# Patient Record
Sex: Female | Born: 1945
Health system: Southern US, Community
[De-identification: ages and names within clinical notes are randomized; demographics above are authoritative.]

## PROBLEM LIST (undated history)

## (undated) DIAGNOSIS — M51369 Other intervertebral disc degeneration, lumbar region without mention of lumbar back pain or lower extremity pain: Secondary | ICD-10-CM

## (undated) DIAGNOSIS — M810 Age-related osteoporosis without current pathological fracture: Secondary | ICD-10-CM

## (undated) DIAGNOSIS — M549 Dorsalgia, unspecified: Secondary | ICD-10-CM

## (undated) DIAGNOSIS — I700 Atherosclerosis of aorta: Secondary | ICD-10-CM

## (undated) DIAGNOSIS — J189 Pneumonia, unspecified organism: Secondary | ICD-10-CM

## (undated) DIAGNOSIS — M1990 Unspecified osteoarthritis, unspecified site: Secondary | ICD-10-CM

## (undated) DIAGNOSIS — F419 Anxiety disorder, unspecified: Secondary | ICD-10-CM

## (undated) DIAGNOSIS — N390 Urinary tract infection, site not specified: Secondary | ICD-10-CM

## (undated) DIAGNOSIS — F329 Major depressive disorder, single episode, unspecified: Secondary | ICD-10-CM

## (undated) DIAGNOSIS — R200 Anesthesia of skin: Secondary | ICD-10-CM

## (undated) DIAGNOSIS — Z8619 Personal history of other infectious and parasitic diseases: Secondary | ICD-10-CM

## (undated) DIAGNOSIS — E785 Hyperlipidemia, unspecified: Secondary | ICD-10-CM

## (undated) DIAGNOSIS — K3184 Gastroparesis: Secondary | ICD-10-CM

## (undated) DIAGNOSIS — M5136 Other intervertebral disc degeneration, lumbar region: Secondary | ICD-10-CM

## (undated) DIAGNOSIS — F32A Depression, unspecified: Secondary | ICD-10-CM

## (undated) DIAGNOSIS — Z87442 Personal history of urinary calculi: Secondary | ICD-10-CM

## (undated) DIAGNOSIS — I10 Essential (primary) hypertension: Secondary | ICD-10-CM

## (undated) DIAGNOSIS — C801 Malignant (primary) neoplasm, unspecified: Secondary | ICD-10-CM

## (undated) HISTORY — PX: CARPAL TUNNEL RELEASE: SHX101

## (undated) HISTORY — DX: Age-related osteoporosis without current pathological fracture: M81.0

## (undated) HISTORY — PX: TONSILLECTOMY: SUR1361

## (undated) HISTORY — PX: TUBAL LIGATION: SHX77

## (undated) HISTORY — DX: Atherosclerosis of aorta: I70.0

## (undated) HISTORY — PX: GANGLION CYST EXCISION: SHX1691

## (undated) HISTORY — PX: RHINOPLASTY: SUR1284

## (undated) HISTORY — PX: EYE SURGERY: SHX253

## (undated) HISTORY — PX: JOINT REPLACEMENT: SHX530

## (undated) HISTORY — DX: Anesthesia of skin: R20.0

## (undated) HISTORY — PX: LITHOTRIPSY: SUR834

## (undated) HISTORY — DX: Hyperlipidemia, unspecified: E78.5

---

## 2003-05-31 ENCOUNTER — Encounter (INDEPENDENT_AMBULATORY_CARE_PROVIDER_SITE_OTHER): Payer: Self-pay | Admitting: *Deleted

## 2003-05-31 LAB — CONVERTED CEMR LAB

## 2003-06-14 ENCOUNTER — Encounter: Admission: RE | Admit: 2003-06-14 | Discharge: 2003-06-14 | Payer: Self-pay | Admitting: Family Medicine

## 2005-07-18 ENCOUNTER — Ambulatory Visit (HOSPITAL_COMMUNITY): Admission: RE | Admit: 2005-07-18 | Discharge: 2005-07-18 | Payer: Self-pay | Admitting: Gastroenterology

## 2005-07-18 ENCOUNTER — Ambulatory Visit: Payer: Self-pay | Admitting: Gastroenterology

## 2006-06-26 DIAGNOSIS — I10 Essential (primary) hypertension: Secondary | ICD-10-CM

## 2006-06-26 DIAGNOSIS — N200 Calculus of kidney: Secondary | ICD-10-CM

## 2006-06-26 HISTORY — DX: Essential (primary) hypertension: I10

## 2006-06-26 HISTORY — DX: Calculus of kidney: N20.0

## 2006-06-27 ENCOUNTER — Encounter (INDEPENDENT_AMBULATORY_CARE_PROVIDER_SITE_OTHER): Payer: Self-pay | Admitting: *Deleted

## 2010-05-23 ENCOUNTER — Encounter
Admission: RE | Admit: 2010-05-23 | Discharge: 2010-05-23 | Payer: Self-pay | Source: Home / Self Care | Attending: Family Medicine | Admitting: Family Medicine

## 2010-09-26 ENCOUNTER — Other Ambulatory Visit: Payer: Self-pay | Admitting: Family Medicine

## 2010-09-26 DIAGNOSIS — Z1231 Encounter for screening mammogram for malignant neoplasm of breast: Secondary | ICD-10-CM

## 2010-10-17 ENCOUNTER — Ambulatory Visit
Admission: RE | Admit: 2010-10-17 | Discharge: 2010-10-17 | Disposition: A | Discharge status: Home / Self Care | Payer: BC Managed Care – PPO | Source: Ambulatory Visit | Attending: Family Medicine | Admitting: Family Medicine

## 2010-10-17 DIAGNOSIS — Z1231 Encounter for screening mammogram for malignant neoplasm of breast: Secondary | ICD-10-CM

## 2011-04-30 HISTORY — PX: BACK SURGERY: SHX140

## 2011-09-28 HISTORY — PX: LUMBAR FUSION: SHX111

## 2011-10-18 ENCOUNTER — Encounter (HOSPITAL_COMMUNITY): Payer: Self-pay | Admitting: Pharmacy Technician

## 2011-10-21 NOTE — H&P (Signed)
Kimberly Larsen 10/21/2011 8:13 AM Location: [facility] Patient #: [id number] DOB: 1945/11/18 Single / Language: Kimberly Larsen / Race: White Female   History of Present Illness(Kimberly Larsen; 10/21/2011 8:15 AM) The patient is a 66 year old female who comes in today for a preoperative History and Physical. The patient is scheduled for a lumbar TLIF L3-4 and decompression L4-5 to be performed by Dr. Debria Garret D. Kimberly Baton, MD at [facility] on [phone] @1000am  . Please see the hospital record for complete dictated history and physical.    Allergies(Kimberly Larsen; 10/21/2011 8:15 AM) No Known Drug Allergies. 02/25/2011   Social History(Kimberly Larsen; 10/21/2011 8:15 AM) Alcohol use. never consumed alcohol Children. 1 Current work status. working full time Drug/[facility]cohol Rehab (Currently). no Exercise. Exercises weekly; does individual sport Illicit drug use. no Living situation. live alone Marital status. 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 greater than 5 Pain Contract. no Previously in rehab. no Tobacco / smoke exposure. no Tobacco use. Never smoker. never smoker   Medication History(Kimberly Larsen; 10/21/2011 8:23 AM) Norco (5-325MG  Tablet, 1 (one) Oral PO TID PRN PAIN, Taken starting 10/18/2011) Active. Folic Acid (qd) Specific dose unknown - Active. Pravastatin Sodium (40MG  Tablet, qd Oral) Active. Quinapril-Hydrochlorothiazide (20-12.5MG  Tablet, 1 po bid Oral) Active. Lodine (400MG  Tablet, Oral) Active. (bid; stopped [id number]) Calcium Citrate (500MG  Capsule, 1 Oral tid) Active. Multiple Vitamin (1 Oral) Active. Cymbalta (60MG  Capsule DR Part, 1qd am Oral) Active. Benadryl (qhs Oral) Specific dose unknown - Active. Melatonin (qd) Specific dose unknown - Active. Super B Complex (bid Oral) Active. Neurontin (600MG  Tablet, 1 po tid (2 tabs at bedtime) Oral)  Active. Aspirin EC (81MG  Tablet DR, qd Oral) Active.   Past Surgical History(Kimberly Larsen; 10/21/2011 8:23 AM) Carpal Tunnel Repair. right Other Surgery. Tubal Ligation, Lithotripsy x 2 Straighten Nasal Septum Tonsillectomy Tubal Ligation   Review of Systems(Kimberly Hiraldo J Kaysen Deal, PA-C; 10/21/2011 8:43 AM) General:Present- Weight Gain. Not Present- Chills, Fever, Night Sweats, Appetite Loss, Fatigue, Feeling sick and Weight Loss. Skin:Not Present- Itching, Rash, Skin Color Changes, Ulcer, Psoriasis and Change in Hair or Nails. HEENT:Not Present- Sensitivity to light, Nose Bleed, Visual Loss, Decreased Hearing and Ringing in the Ears. Neck:Not Present- Swollen Glands and Neck Mass. Respiratory:Not Present- Shortness of breath, Snoring, Chronic Cough and Bloody sputum. Cardiovascular:Not Present- Shortness of Breath, Chest Pain, Swelling of Extremities, Leg Cramps and Palpitations. Gastrointestinal:Not Present- Bloody Stool, Heartburn, Abdominal Pain, Vomiting, Nausea and Incontinence of Stool. Female Genitourinary:Not Present- Blood in Urine, Irregular/missing periods, Frequency, Incontinence and Nocturia. Musculoskeletal:Present- Muscle Weakness, Muscle Pain, Joint Stiffness, Joint Swelling, Joint Pain and Back Pain. Neurological:Present- Tingling. Not Present- Numbness, Burning, Tremor, Headaches and Dizziness. Psychiatric:Present- Anxiety. Not Present- Depression and Memory Loss. Endocrine:Present- Excessive hunger. Not Present- Cold Intolerance, Heat Intolerance and Excessive Thirst. Hematology:Not Present- Abnormal Bleeding, Abnormal bruising, Anemia and Blood Clots.   Vitals(Kimberly Larsen; 10/21/2011 8:26 AM) 10/21/2011 8:24 AM Weight: 201 lb Height: 62 in Body Surface Area: 2 m Body Mass Index: 36.76 kg/m Pulse: 84 (Regular) BP: 151/86 (Sitting, Left Arm, Standard)    Physical Exam([name], PA-C; 10/21/2011 9:09 AM) The physical exam findings are  as follows:   General General Appearance- pleasant. Not in acute distress. Orientation- Oriented X3. Build & Nutrition- Well nourished and Well developed. Posture- Normal posture. Gait- Normal. Mental Status- Alert.   Integumentary Lumbar Spine- Skin examination of the lumbar spine is without deformity, skin  lesions, lacerations or abrasions.   Head and Neck Neck Global Assessment- supple. no lymphadenopathy and no nucchal rigidty.   Eye Pupil- Bilateral- Normal, Direct reaction to light normal, Equal and Regular. Motion- Bilateral- EOMI.   Chest and Lung Exam Auscultation: Breath sounds:- Clear.   Cardiovascular Auscultation:Rhythm- Regular rate and rhythm. Heart Sounds- Normal heart sounds.   Abdomen Palpation/Percussion:Palpation and Percussion of the abdomen reveal - Non Tender, No Rebound tenderness and Soft.   Peripheral Vascular Lower Extremity:Inspection- Bilateral- Inspection Normal. Palpation:Posterior tibial pulse- Bilateral- 2+. Dorsalis pedis pulse- Bilateral- 2+.   Neurologic Sensation:Lower Extremity- Bilateral- sensation is intact in the lower extremity. Reflexes:Patellar Reflex- Bilateral- 1+. Achilles Reflex- Bilateral- 1+. Babinski- Bilateral- Babinski not present. Clonus- Bilateral- clonus not present. Hoffman's Sign- Bilateral- Hoffman's sign not present. Testing:Seated Straight Leg Raise- Bilateral- Seated straight leg raise negative.   Musculoskeletal Spine/Ribs/Pelvis Lumbosacral Spine:Inspection and Palpation- Tenderness- generalized. bony and soft tissue palpation of the lumbar spine and SI joint does not recreate their typical pain. Strength and Tone: Strength:Hip Flexion- Bilateral- 5/5. Knee Extension- Bilateral- 5/5. Knee Flexion- Bilateral- 5/5. Ankle Dorsiflexion- Bilateral- 5/5. Ankle Plantarflexion- Bilateral- 5/5. Heel walk- Bilateral- able to heel walk without  difficulty. Toe Walk- Bilateral- able to walk on toes without difficulty. Heel-Toe Walk- Bilateral- able to heel-toe walk without difficulty. ROM- Flexion- full range of motion. Extension- full range of motion. Pain:- neither flexion or extension is more painful than the other. Waddell's Signs- no Waddell's signs present. Lower Extremity Range of Motion:- No true hip, knee or ankle pain with range of motion. Gait and Station:Assistive Devices- no assistive devices.   Assessment & Plan(Tommie Bohlken J Germaine Shenker, PA-C; 10/21/2011 9:02 AM) Lumbar/Lumbosacral Disc Degeneration (722.52) Current Plans l Started Xanax 0.25MG , 1 Tablet 1 qhs, #7, 10/21/2011, No Refill.  Spondylolisthesis, Acquired (738.4)  Note: unfortunately conservative measures consisting of observation, activity modification, oral pain medications, physical therapy and injection therapy have failed to alleviate her symptoms and given the ongoing nature eof her pain and the significant decrease in her quality of life, she wises to proceed with surgery. Risks/benefits/alternatives to the procedure/expectations following the procedure have been reviewed with the patient by Dr. Shon Baton. She understands  MRI of the lumbar spine dated 05/14/11 demonstrates a grade 1 anterior listhesis at L3-L4 with severe spinal stenosis right foraminal stenosis. She has left lateral recess stenosis at 4-5 and to a lesser degree 5-1. Please see the actual report in the patients chart for the specifics.  She has been medically cleared for the procedure by Dr. [name]. Please see the scanned clearance form in the patient office chart. She has not yet completed her pre-op hospital requirements and is scheduled to do so tomorrow. She has been fitted for her lumbar corsett brace this morning and she knows to bring this with her the morning of surgery.   All of her questions have been encouraged, addressed and answered. Plan, at this time is  to proceed with surgery as scheduled.   Signed electronically by Gwinda Maine, PA-C (10/21/2011 9:11 AM)  Glory Buff 10/01/2011 9:04 AM Location: [facility] Patient #: [id number] DOB: Apr 17, 1946 Single / Language: Kimberly Larsen / Race: White Female   History of Present Illness(Kimberly Larsen; 10/01/2011 9:16 AM) The patient is a 66 year old female who presents with back pain. The patient is here today in referral from [facility]. patient of practice . The patient reports low back symptoms including pain which began 17 year(s) ago following a specific injury (2012). The  injury occurred due to lifting (she did remember lifting a pack of paper with low back pain ) and bending. and Symptoms include pain (about the lower lumbar region radiating into bilat. lower ext. (anteriorly) on the right to the level of the knee and left posterior to the level of the knee and anteriorly to mid quad.), while symptoms do not include numbness or weakness. Current treatment includes non-opioid analgesics and activity modification. Prior to being seen today the patient was previously evaluated by an out of town physician ([facility] Past evaluation has included x-ray of the lumbar spine, MRI of the lumbar spine (recently performed at [facility]. 04/2011), orthopedic evaluation and neurosurgical evaluation. Past treatment has included epidural injections (times three with minimal relief from the third ).    Subjective Transcription(DAHARI Sheela Stack, MD; 10/03/2011 9:18 AM)  Reason for consultation: Persistent back, buttock and hamstring pain.    Khianna is a very pleasant, active young woman who is still working who over the last 6-7 months has had progressive debilitating back pain. She had a MRI done May 14, 2011 which demonstrated a 3-4 slip with severe bilateral facet arthritis, thickening of the ligamentum flavum and severe spinal stenosis and a right sided disc protrusion. She also had a small She also  had a small broad based disc protrusion at L4-L5 onto the left causing severe left lateral recess stenosis and a 5-1 central disc protrusion also slightly asymmetric to the left causing some compression of the S1 nerve root. The patient's principle complaint has been ongoing severe debilitating back and buttock pain. She has difficulty arising from a seated position. Difficulty walking. She states that she had a series of three epidural injections which only provided temporary relief. She has attempted to do exercises on her own but her overall quality of life is deteriorating. Her overall mental wellbeing is deteriorating because of this pain. As a result she referred herself to me for a surgical opinion concerning her pain.    RADIOGRAPHS:  I reviewed the MRI with the patient and her daughter. All of their questions were addressed. She does have a grade 1 anterior listhesis at L3-L4 with severe spinal stenosis right foraminal stenosis. She has left lateral recess stenosis at 4-5 and to a lesser degree 5-1.    Allergies(Kimberly Larsen; 10/01/2011 9:16 AM) No Known Drug Allergies. 02/25/2011   Family History(Kimberly Larsen; 10/01/2011 9:16 AM) Cerebrovascular Accident. mother Chronic Obstructive Lung Disease. mother and father Congestive Heart Failure. father Drug / Alcohol Addiction. father Heart Disease. father Heart disease in female family member before age 72 Heart disease in female family member before age 89 Hypertension. father   Social History(Kimberly Larsen; 10/01/2011 9:16 AM) Alcohol use. never consumed alcohol Children. 1 Current work status. working full time Drug/Alcohol Rehab (Currently). no Exercise. Exercises weekly; does individual sport Illicit drug use. no Living situation. live alone Marital status. 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 greater than  5 Pain Contract. no Previously in rehab. no Tobacco / smoke exposure. no Tobacco use. Never smoker. never smoker   Medication History(Kimberly Larsen; 10/01/2011 9:18 AM) Pravastatin Sodium (40MG  Tablet, Oral) Active. Quinapril-Hydrochlorothiazide (20-12.5MG  Tablet, Oral) Active. Lodine (400MG  Tablet, Oral) Active. (bid) Neurontin (300MG  Capsule, Oral) Active. Ultram (50MG  Tablet, Oral) Active. Calcium Citrate (200MG  Tablet, 1 Oral) Active. Multiple Vitamin (1 Oral) Active. Fish Oil Concentrate (435MG  Capsule, 1 Oral) Active. Aspirin (81MG  Tablet, 1 Oral) Active.  Zinc 15 ( Oral) Specific dose unknown - Active. Cymbalta ( Oral) Specific dose unknown - Active. Vitamin C ( Oral) Specific dose unknown - Active. Benadryl ( Oral) Specific dose unknown - Active. Melatonin Specific dose unknown - Active. Super B Complex ( Oral) Active. Magnesium Oxide (400MG  Capsule, Oral) Active. Vitamin D3 ( Oral) Specific dose unknown - Active. Potassium Acetate Specific dose unknown - Active. Ginkgo Biloba Specific dose unknown - Active.   Pregnancy / Birth History(Kimberly Larsen; 10/01/2011 9:16 AM) Pregnant. no   Past Surgical History(Kimberly Larsen; 10/01/2011 9:16 AM) Carpal Tunnel Repair. right Other Surgery. Tubal Ligation, Lithotripsy x 2 Straighten Nasal Septum Tonsillectomy Tubal Ligation   Other Problems(DAHARI D BROOKS, MD; 10/01/2011 9:52 AM) Anxiety Disorder Chronic Pain Depression Gastroesophageal Reflux Disease High blood pressure Hypercholesterolemia Kidney Stone Osteoarthritis   Review of Systems(Kimberly Larsen; 10/01/2011 9:16 AM) General:Present- Weight Gain. Not Present- Chills, Fever, Night Sweats, Appetite Loss, Fatigue, Feeling sick and Weight Loss. Skin:Not Present- Itching, Rash, Skin Color Changes, Ulcer, Psoriasis and Change in Hair or Nails. HEENT:Not Present- Sensitivity to light, Nose Bleed, Visual Loss, Decreased Hearing and Ringing in the  Ears. Neck:Not Present- Swollen Glands and Neck Mass. Respiratory:Not Present- Shortness of breath, Snoring, Chronic Cough and Bloody sputum. Cardiovascular:Not Present- Shortness of Breath, Chest Pain, Swelling of Extremities, Leg Cramps and Palpitations. Gastrointestinal:Not Present- Bloody Stool, Heartburn, Abdominal Pain, Vomiting, Nausea and Incontinence of Stool. Female Genitourinary:Not Present- Blood in Urine, Irregular/missing periods, Frequency, Incontinence and Nocturia. Musculoskeletal:Present- Muscle Weakness, Muscle Pain, Joint Stiffness, Joint Swelling, Joint Pain and Back Pain. Neurological:Present- Tingling. Not Present- Numbness, Burning, Tremor, Headaches and Dizziness. Psychiatric:Present- Anxiety. Not Present- Depression and Memory Loss. Endocrine:Present- Excessive hunger. Not Present- Cold Intolerance, Heat Intolerance and Excessive Thirst. Hematology:Not Present- Abnormal Bleeding, Abnormal bruising, Anemia and Blood Clots.   Vitals(Kimberly Larsen; 10/01/2011 9:20 AM) 10/01/2011 9:18 AM Weight: 201 lb Height: 61 in Body Surface Area: 1.98 m Body Mass Index: 37.98 kg/m BP: 137/78 (Sitting, Left Arm, Standard)    Objective Transcription(DAHARI D BROOKS, MD; 10/03/2011 9:18 AM)  She is a pleasant young woman who appears her stated age in no acute distress. She is alert and oriented x3. No shortness of breath or chest pain. The abdomen is soft and nontender. No surgical scars, abrasions or contusions on the lumbar spine. She has pain with forward flexion and extension and lateral rotation. She has no focal motor deficits in the lower extremity. She has sensation intact to light touch in the lower extremities. Compartments are soft and nontender. Intact peripheral pulses bilaterally. Negative Babinski test. 1+ symmetrical deep tendon reflexes. She has a history of right knee arthritis and she has some pain anteriorly with range of motion but  otherwise no hip or ankle pain or left knee pain with palpation and range of motion.    Assessment & Plan(DAHARI D BROOKS, MD; 10/01/2011 9:52 AM) Lumbar/Lumbosacral Disc Degeneration (722.52)  Spondylolisthesis, Acquired (738.4)   Assessments Transcription(DAHARI D BROOKS, MD; 10/03/2011 9:18 AM)  At this point in time, the patient has severe spinal stenosis at 3-4 with instability as well as moderate left lateral recess stenosis at 4-5 and 5-1. After a long discussion with the patient and her daughter we have made the final decision. She indicates that she has had a DEXA scan in the recent past and did not have any significant osteoporosis or osteopenia. Given the fact that she has good quality of bone and she has instability at 3-4 I would favor doing  a TLIF at that level. The patient is going to require a decompression to alleviate the stenosis and then because of the decompression there is now an increased risk of instability because of this spondylolisthesis that exists there. I think this can be best addressed by doing a decompression and then the interbody fixation to prevent instability. Because there is also pathology at 4-5 and to a lesser degree at 5-1 coming in from the posterior approach also allowing to do a decompression (laminotomy/laminectomy) at 4-5 as well as at 5-1 on the left side as this is the area of greatest stenosis. I do think that this approach provides me adequate exposure to do all that I need to do. I have reviewed the risks with the patient which includes infection, bleeding, nerve damage, death, stroke, paralysis, failure to heal, need for further surgery, ongoing or worse pain, loss of bowel and bladder control, hardware failure, leak of spinal fluid, need for further surgery, non-union meaning it does not fuse or the levels above or below can breakdown and require more surgery. All questions encouraged and addressed. She will review the material and contact  me and let me know how she would like to proceed.    Miscellaneous Transcription(DAHARI Sheela Stack, MD; 10/03/2011 9:18 AM)  Alvy Beal, MD/ljs    T: 10/03/11  D: 10/01/11    Signed electronically by Alvy Beal, MD (10/01/2011 4:29 PM)

## 2011-10-22 ENCOUNTER — Encounter (HOSPITAL_COMMUNITY): Payer: Self-pay

## 2011-10-22 ENCOUNTER — Encounter (HOSPITAL_COMMUNITY)
Admission: RE | Admit: 2011-10-22 | Discharge: 2011-10-22 | Disposition: A | Discharge status: Home / Self Care | Payer: BC Managed Care – PPO | Source: Ambulatory Visit | Attending: Orthopedic Surgery | Admitting: Orthopedic Surgery

## 2011-10-22 ENCOUNTER — Encounter (HOSPITAL_COMMUNITY)
Admission: RE | Admit: 2011-10-22 | Discharge: 2011-10-22 | Disposition: A | Discharge status: Home / Self Care | Payer: BC Managed Care – PPO | Source: Ambulatory Visit | Attending: Physician Assistant | Admitting: Physician Assistant

## 2011-10-22 HISTORY — DX: Unspecified osteoarthritis, unspecified site: M19.90

## 2011-10-22 HISTORY — DX: Essential (primary) hypertension: I10

## 2011-10-22 HISTORY — DX: Hyperlipidemia, unspecified: E78.5

## 2011-10-22 LAB — COMPREHENSIVE METABOLIC PANEL
ALT: 24 U/L (ref 0–35)
AST: 23 U/L (ref 0–37)
Albumin: 3.7 g/dL (ref 3.5–5.2)
Alkaline Phosphatase: 110 U/L (ref 39–117)
BUN: 21 mg/dL (ref 6–23)
CO2: 30 mEq/L (ref 19–32)
Calcium: 10.1 mg/dL (ref 8.4–10.5)
Chloride: 100 mEq/L (ref 96–112)
Creatinine, Ser: 0.7 mg/dL (ref 0.50–1.10)
GFR calc Af Amer: >90 mL/min (ref >90)
GFR calc non Af Amer: >90 mL/min (ref >90)
Glucose, Bld: 91 mg/dL (ref 70–99)
Potassium: 3.2 mEq/L — ABNORMAL LOW (ref 3.5–5.1)
Sodium: 140 mEq/L (ref 135–145)
Total Bilirubin: 0.5 mg/dL (ref 0.3–1.2)
Total Protein: 7 g/dL (ref 6.0–8.3)

## 2011-10-22 LAB — DIFFERENTIAL
Basophils Absolute: 0.1 10*3/uL (ref 0.0–0.1)
Basophils Relative: 1 % (ref 0–1)
Eosinophils Absolute: 0.1 10*3/uL (ref 0.0–0.7)
Eosinophils Relative: 1 % (ref 0–5)
Lymphocytes Relative: 16 % (ref 12–46)
Lymphs Abs: 1.9 10*3/uL (ref 0.7–4.0)
Monocytes Absolute: 1.2 10*3/uL — ABNORMAL HIGH (ref 0.1–1.0)
Monocytes Relative: 10 % (ref 3–12)
Neutro Abs: 8.8 10*3/uL — ABNORMAL HIGH (ref 1.7–7.7)
Neutrophils Relative %: 73 % (ref 43–77)

## 2011-10-22 LAB — CBC
HCT: 42.5 % (ref 36.0–46.0)
Hemoglobin: 14 g/dL (ref 12.0–15.0)
MCH: 31 pg (ref 26.0–34.0)
MCHC: 32.9 g/dL (ref 30.0–36.0)
MCV: 94.2 fL (ref 78.0–100.0)
Platelets: 319 10*3/uL (ref 150–400)
RBC: 4.51 MIL/uL (ref 3.87–5.11)
RDW: 12.6 % (ref 11.5–15.5)
WBC: 12 10*3/uL — ABNORMAL HIGH (ref 4.0–10.5)

## 2011-10-22 LAB — TYPE AND SCREEN
ABO/RH(D): O POS
Antibody Screen: NEGATIVE

## 2011-10-22 LAB — PROTIME-INR
INR: 0.91 (ref 0.00–1.49)
Prothrombin Time: 12.5 seconds (ref 11.6–15.2)

## 2011-10-22 LAB — SURGICAL PCR SCREEN
MRSA, PCR: NEGATIVE
Staphylococcus aureus: NEGATIVE

## 2011-10-22 LAB — ABO/RH: ABO/RH(D): O POS

## 2011-10-22 NOTE — Progress Notes (Signed)
Requested CXR and EKG from Dr. Len Childs office, [place].

## 2011-10-22 NOTE — Pre-Procedure Instructions (Signed)
[name]  10/22/2011   Your procedure is scheduled on:  Thursday June 27  Report to [facility] at 10:00 AM.  Call this number if you have problems the morning of surgery: [phone]   Remember:   Do not eat food:After Midnight.  May have clear liquids:until Midnight .  Clear liquids include soda, tea, black coffee, apple or grape juice, broth.  Take these medicines the morning of surgery with A SIP OF WATER: Cymbalta, Neurontin, Tramadol if needed   Do not wear jewelry, make-up or nail polish.  Do not wear lotions, powders, or perfumes. You may wear deodorant.  Do not shave 48 hours prior to surgery. Men may shave face and neck.  Do not bring valuables to the hospital.  Contacts, dentures or bridgework may not be worn into surgery.  Leave suitcase in the car. After surgery it may be brought to your room.  For patients admitted to the hospital, checkout time is 11:00 AM the day of discharge.   Patients discharged the day of surgery will not be allowed to drive home.  Name and phone number of your driver: NA  Special Instructions: Incentive Spirometry - Practice and bring it with you on the day of surgery. and CHG Shower Use Special Wash: 1/2 bottle night before surgery and 1/2 bottle morning of surgery.   Please read over the following fact sheets that you were given: Pain Booklet, Coughing and Deep Breathing, Blood Transfusion Information and Surgical Site Infection Prevention

## 2011-10-23 MED ORDER — CEFAZOLIN SODIUM-DEXTROSE 2-3 GM-% IV SOLR
2.0000 g | INTRAVENOUS | Status: AC
  Administered 2011-10-24: 2 g via INTRAVENOUS
  Filled 2011-10-23: qty 50

## 2011-10-23 MED ORDER — LACTATED RINGERS IV SOLN
INTRAVENOUS | Status: DC
  Administered 2011-10-24: 12:00:00 via INTRAVENOUS

## 2011-10-24 ENCOUNTER — Ambulatory Visit (HOSPITAL_COMMUNITY): Payer: BC Managed Care – PPO

## 2011-10-24 ENCOUNTER — Inpatient Hospital Stay (HOSPITAL_COMMUNITY)
Admission: RE | Admit: 2011-10-24 | Discharge: 2011-10-28 | DRG: 756 | Disposition: A | Discharge status: Home Health | Payer: BC Managed Care – PPO | Source: Ambulatory Visit | Attending: Orthopedic Surgery | Admitting: Orthopedic Surgery

## 2011-10-24 ENCOUNTER — Encounter (HOSPITAL_COMMUNITY): Payer: Self-pay | Admitting: *Deleted

## 2011-10-24 ENCOUNTER — Encounter (HOSPITAL_COMMUNITY)
Admission: RE | Disposition: A | Discharge status: Home Health | Payer: Self-pay | Source: Ambulatory Visit | Attending: Orthopedic Surgery

## 2011-10-24 ENCOUNTER — Encounter (HOSPITAL_COMMUNITY): Payer: Self-pay | Admitting: Anesthesiology

## 2011-10-24 ENCOUNTER — Inpatient Hospital Stay (HOSPITAL_COMMUNITY): Payer: BC Managed Care – PPO

## 2011-10-24 ENCOUNTER — Ambulatory Visit (HOSPITAL_COMMUNITY): Payer: BC Managed Care – PPO | Admitting: Anesthesiology

## 2011-10-24 DIAGNOSIS — Z7982 Long term (current) use of aspirin: Secondary | ICD-10-CM

## 2011-10-24 DIAGNOSIS — M4310 Spondylolisthesis, site unspecified: Secondary | ICD-10-CM | POA: Diagnosis present

## 2011-10-24 DIAGNOSIS — M5137 Other intervertebral disc degeneration, lumbosacral region: Principal | ICD-10-CM | POA: Diagnosis present

## 2011-10-24 DIAGNOSIS — Z9089 Acquired absence of other organs: Secondary | ICD-10-CM

## 2011-10-24 DIAGNOSIS — M51379 Other intervertebral disc degeneration, lumbosacral region without mention of lumbar back pain or lower extremity pain: Principal | ICD-10-CM | POA: Diagnosis present

## 2011-10-24 DIAGNOSIS — M5136 Other intervertebral disc degeneration, lumbar region: Secondary | ICD-10-CM

## 2011-10-24 DIAGNOSIS — Z01812 Encounter for preprocedural laboratory examination: Secondary | ICD-10-CM

## 2011-10-24 SURGERY — POSTERIOR LUMBAR FUSION 1 LEVEL
Anesthesia: General | Site: Back | Wound class: Clean

## 2011-10-24 MED ORDER — METHOCARBAMOL 100 MG/ML IJ SOLN
500.0000 mg | INTRAVENOUS | Status: AC
  Administered 2011-10-24: 500 mg via INTRAVENOUS
  Filled 2011-10-24: qty 5

## 2011-10-24 MED ORDER — DEXAMETHASONE 4 MG PO TABS
4.0000 mg | ORAL_TABLET | Freq: Four times a day (QID) | ORAL | Status: DC
Start: 2011-10-25 — End: 2011-10-28
  Administered 2011-10-25 – 2011-10-28 (×11): 4 mg via ORAL
  Filled 2011-10-24 (×18): qty 1

## 2011-10-24 MED ORDER — ONDANSETRON HCL 4 MG/2ML IJ SOLN
INTRAMUSCULAR | Status: DC | PRN
  Administered 2011-10-24: 4 mg via INTRAVENOUS

## 2011-10-24 MED ORDER — DIPHENHYDRAMINE HCL 12.5 MG/5ML PO ELIX: 12.5000 mg | ORAL_SOLUTION | Freq: Four times a day (QID) | ORAL | Status: DC | PRN

## 2011-10-24 MED ORDER — FLEET ENEMA 7-19 GM/118ML RE ENEM: 1.0000 | ENEMA | Freq: Once | RECTAL | Status: AC | PRN

## 2011-10-24 MED ORDER — HYDROMORPHONE HCL PF 1 MG/ML IJ SOLN
INTRAMUSCULAR | Status: AC
  Administered 2011-10-24: 0.5 mg via INTRAVENOUS
  Filled 2011-10-24: qty 1

## 2011-10-24 MED ORDER — SUCCINYLCHOLINE CHLORIDE 20 MG/ML IJ SOLN
INTRAMUSCULAR | Status: DC | PRN
  Administered 2011-10-24: 100 mg via INTRAVENOUS

## 2011-10-24 MED ORDER — VANCOMYCIN HCL POWD: 500.0000 mg | Status: DC

## 2011-10-24 MED ORDER — DEXAMETHASONE SODIUM PHOSPHATE 4 MG/ML IJ SOLN
INTRAMUSCULAR | Status: DC | PRN
  Administered 2011-10-24: 12 mg via INTRAVENOUS

## 2011-10-24 MED ORDER — METHOCARBAMOL 500 MG PO TABS
500.0000 mg | ORAL_TABLET | Freq: Four times a day (QID) | ORAL | Status: DC | PRN
  Administered 2011-10-24 – 2011-10-27 (×7): 500 mg via ORAL
  Filled 2011-10-24 (×7): qty 1

## 2011-10-24 MED ORDER — VANCOMYCIN HCL 500 MG IV SOLR
INTRAVENOUS | Status: DC | PRN
  Administered 2011-10-24: 500 mg

## 2011-10-24 MED ORDER — HYDROCHLOROTHIAZIDE 12.5 MG PO CAPS
12.5000 mg | ORAL_CAPSULE | Freq: Every day | ORAL | Status: DC
  Administered 2011-10-24 – 2011-10-28 (×5): 12.5 mg via ORAL
  Filled 2011-10-24 (×5): qty 1

## 2011-10-24 MED ORDER — ALPRAZOLAM 0.25 MG PO TABS
0.2500 mg | ORAL_TABLET | Freq: Every evening | ORAL | Status: DC | PRN
  Administered 2011-10-24 – 2011-10-28 (×4): 0.25 mg via ORAL
  Filled 2011-10-24 (×4): qty 1

## 2011-10-24 MED ORDER — SODIUM CHLORIDE 0.9 % IJ SOLN: 9.0000 mL | INTRAMUSCULAR | Status: DC | PRN

## 2011-10-24 MED ORDER — ZOLPIDEM TARTRATE 10 MG PO TABS
10.0000 mg | ORAL_TABLET | Freq: Every evening | ORAL | Status: DC | PRN
  Administered 2011-10-24 – 2011-10-27 (×4): 10 mg via ORAL
  Filled 2011-10-24 (×4): qty 1

## 2011-10-24 MED ORDER — DULOXETINE HCL 60 MG PO CPEP
60.0000 mg | ORAL_CAPSULE | Freq: Every day | ORAL | Status: DC
  Administered 2011-10-25 – 2011-10-28 (×4): 60 mg via ORAL
  Filled 2011-10-24 (×5): qty 1

## 2011-10-24 MED ORDER — LACTATED RINGERS IV SOLN: INTRAVENOUS | Status: DC

## 2011-10-24 MED ORDER — PHENOL 1.4 % MT LIQD: 1.0000 | OROMUCOSAL | Status: DC | PRN

## 2011-10-24 MED ORDER — SIMVASTATIN 5 MG PO TABS
5.0000 mg | ORAL_TABLET | Freq: Every day | ORAL | Status: DC
  Administered 2011-10-24 – 2011-10-27 (×4): 5 mg via ORAL
  Filled 2011-10-24 (×5): qty 1

## 2011-10-24 MED ORDER — PHENYLEPHRINE HCL 10 MG/ML IJ SOLN
INTRAMUSCULAR | Status: DC | PRN
  Administered 2011-10-24: 40 ug via INTRAVENOUS
  Administered 2011-10-24: 80 ug via INTRAVENOUS
  Administered 2011-10-24 (×2): 40 ug via INTRAVENOUS

## 2011-10-24 MED ORDER — THROMBIN 20000 UNITS EX KIT
PACK | OROMUCOSAL | Status: DC | PRN
  Administered 2011-10-24: 15:00:00 via TOPICAL

## 2011-10-24 MED ORDER — BUPIVACAINE-EPINEPHRINE 0.25% -1:200000 IJ SOLN
INTRAMUSCULAR | Status: DC | PRN
  Administered 2011-10-24: 10 mL

## 2011-10-24 MED ORDER — QUINAPRIL-HYDROCHLOROTHIAZIDE 20-12.5 MG PO TABS: 1.0000 | ORAL_TABLET | Freq: Every day | ORAL | Status: DC

## 2011-10-24 MED ORDER — LISINOPRIL 20 MG PO TABS
20.0000 mg | ORAL_TABLET | Freq: Every day | ORAL | Status: DC
  Administered 2011-10-24 – 2011-10-28 (×5): 20 mg via ORAL
  Filled 2011-10-24 (×5): qty 1

## 2011-10-24 MED ORDER — ACETAMINOPHEN 10 MG/ML IV SOLN
INTRAVENOUS | Status: AC
  Filled 2011-10-24: qty 100

## 2011-10-24 MED ORDER — MORPHINE SULFATE (PF) 1 MG/ML IV SOLN
INTRAVENOUS | Status: DC
  Administered 2011-10-24: 20:00:00 via INTRAVENOUS
  Administered 2011-10-25: 3 mg via INTRAVENOUS
  Administered 2011-10-25: 1 mg via INTRAVENOUS

## 2011-10-24 MED ORDER — PROPOFOL 10 MG/ML IV EMUL
INTRAVENOUS | Status: DC | PRN
  Administered 2011-10-24: 25 ug/kg/min via INTRAVENOUS

## 2011-10-24 MED ORDER — DEXAMETHASONE SODIUM PHOSPHATE 4 MG/ML IJ SOLN
4.0000 mg | Freq: Four times a day (QID) | INTRAMUSCULAR | Status: DC
  Administered 2011-10-24 – 2011-10-26 (×3): 4 mg via INTRAVENOUS
  Filled 2011-10-24 (×18): qty 1

## 2011-10-24 MED ORDER — ACETAMINOPHEN 10 MG/ML IV SOLN
1000.0000 mg | Freq: Four times a day (QID) | INTRAVENOUS | Status: AC
  Administered 2011-10-24 – 2011-10-25 (×3): 1000 mg via INTRAVENOUS
  Filled 2011-10-24 (×4): qty 100

## 2011-10-24 MED ORDER — LACTATED RINGERS IV SOLN
INTRAVENOUS | Status: DC | PRN
  Administered 2011-10-24 (×3): via INTRAVENOUS

## 2011-10-24 MED ORDER — MENTHOL 3 MG MT LOZG: 1.0000 | LOZENGE | OROMUCOSAL | Status: DC | PRN

## 2011-10-24 MED ORDER — THROMBIN 20000 UNITS EX SOLR
CUTANEOUS | Status: AC
  Filled 2011-10-24: qty 20000

## 2011-10-24 MED ORDER — FENTANYL CITRATE 0.05 MG/ML IJ SOLN
INTRAMUSCULAR | Status: DC | PRN
  Administered 2011-10-24 (×4): 50 ug via INTRAVENOUS
  Administered 2011-10-24: 100 ug via INTRAVENOUS
  Administered 2011-10-24 (×2): 50 ug via INTRAVENOUS

## 2011-10-24 MED ORDER — ACETAMINOPHEN 10 MG/ML IV SOLN
INTRAVENOUS | Status: DC | PRN
  Administered 2011-10-24: 1000 mg via INTRAVENOUS

## 2011-10-24 MED ORDER — SODIUM CHLORIDE 0.9 % IJ SOLN
3.0000 mL | Freq: Two times a day (BID) | INTRAMUSCULAR | Status: DC
  Administered 2011-10-24 – 2011-10-26 (×3): 3 mL via INTRAVENOUS

## 2011-10-24 MED ORDER — NALOXONE HCL 0.4 MG/ML IJ SOLN: 0.4000 mg | INTRAMUSCULAR | Status: DC | PRN

## 2011-10-24 MED ORDER — MORPHINE SULFATE (PF) 1 MG/ML IV SOLN
INTRAVENOUS | Status: AC
  Filled 2011-10-24: qty 25

## 2011-10-24 MED ORDER — CEFAZOLIN SODIUM 1-5 GM-% IV SOLN
1.0000 g | Freq: Three times a day (TID) | INTRAVENOUS | Status: AC
  Administered 2011-10-24 – 2011-10-25 (×2): 1 g via INTRAVENOUS
  Filled 2011-10-24 (×2): qty 50

## 2011-10-24 MED ORDER — OXYCODONE HCL 5 MG PO TABS
10.0000 mg | ORAL_TABLET | ORAL | Status: DC | PRN
  Administered 2011-10-25: 10 mg via ORAL
  Administered 2011-10-25: 5 mg via ORAL
  Administered 2011-10-25 (×2): 10 mg via ORAL
  Administered 2011-10-26: 5 mg via ORAL
  Administered 2011-10-26 – 2011-10-28 (×11): 10 mg via ORAL
  Filled 2011-10-24 (×6): qty 2
  Filled 2011-10-24: qty 1
  Filled 2011-10-24 (×3): qty 2
  Filled 2011-10-24 (×2): qty 1
  Filled 2011-10-24 (×2): qty 2
  Filled 2011-10-24: qty 1
  Filled 2011-10-24 (×2): qty 2

## 2011-10-24 MED ORDER — HYDROMORPHONE HCL PF 1 MG/ML IJ SOLN
0.2500 mg | INTRAMUSCULAR | Status: DC | PRN
  Administered 2011-10-24 (×4): 0.5 mg via INTRAVENOUS

## 2011-10-24 MED ORDER — 0.9 % SODIUM CHLORIDE (POUR BTL) OPTIME
TOPICAL | Status: DC | PRN
  Administered 2011-10-24: 1000 mL

## 2011-10-24 MED ORDER — ONDANSETRON HCL 4 MG/2ML IJ SOLN: 4.0000 mg | Freq: Four times a day (QID) | INTRAMUSCULAR | Status: DC | PRN

## 2011-10-24 MED ORDER — DIPHENHYDRAMINE HCL 50 MG/ML IJ SOLN
12.5000 mg | Freq: Four times a day (QID) | INTRAMUSCULAR | Status: DC | PRN
  Administered 2011-10-25: 12.5 mg via INTRAVENOUS
  Filled 2011-10-24: qty 1

## 2011-10-24 MED ORDER — PROPOFOL 10 MG/ML IV EMUL
INTRAVENOUS | Status: DC | PRN
  Administered 2011-10-24: 50 mg via INTRAVENOUS
  Administered 2011-10-24: 150 mg via INTRAVENOUS

## 2011-10-24 MED ORDER — LIDOCAINE HCL (CARDIAC) 20 MG/ML IV SOLN
INTRAVENOUS | Status: DC | PRN
  Administered 2011-10-24: 80 mg via INTRAVENOUS

## 2011-10-24 MED ORDER — GENTAMICIN SULFATE 40 MG/ML IJ SOLN
INTRAMUSCULAR | Status: DC | PRN
  Administered 2011-10-24: 240 mL

## 2011-10-24 MED ORDER — DOCUSATE SODIUM 100 MG PO CAPS
100.0000 mg | ORAL_CAPSULE | Freq: Two times a day (BID) | ORAL | Status: DC
  Administered 2011-10-24 – 2011-10-28 (×8): 100 mg via ORAL
  Filled 2011-10-24 (×9): qty 1

## 2011-10-24 MED ORDER — SODIUM CHLORIDE 0.9 % IJ SOLN: 3.0000 mL | INTRAMUSCULAR | Status: DC | PRN

## 2011-10-24 MED ORDER — ONDANSETRON HCL 4 MG/2ML IJ SOLN: 4.0000 mg | Freq: Once | INTRAMUSCULAR | Status: DC | PRN

## 2011-10-24 MED ORDER — METHOCARBAMOL 100 MG/ML IJ SOLN
500.0000 mg | Freq: Four times a day (QID) | INTRAVENOUS | Status: DC | PRN
  Filled 2011-10-24: qty 5

## 2011-10-24 MED ORDER — VANCOMYCIN HCL 500 MG IV SOLR
500.0000 mg | INTRAVENOUS | Status: AC
  Filled 2011-10-24: qty 500

## 2011-10-24 MED ORDER — ONDANSETRON HCL 4 MG/2ML IJ SOLN: 4.0000 mg | INTRAMUSCULAR | Status: DC | PRN

## 2011-10-24 MED ORDER — BUPIVACAINE-EPINEPHRINE PF 0.25-1:200000 % IJ SOLN
INTRAMUSCULAR | Status: AC
  Filled 2011-10-24: qty 30

## 2011-10-24 MED ORDER — GENTAMICIN SULFATE 40 MG/ML IJ SOLN
INTRAMUSCULAR | Status: AC
  Filled 2011-10-24: qty 8

## 2011-10-24 MED ORDER — HEPARIN SODIUM (PORCINE) 1000 UNIT/ML IJ SOLN
INTRAMUSCULAR | Status: AC
  Filled 2011-10-24: qty 1

## 2011-10-24 MED ORDER — SODIUM CHLORIDE 0.9 % IV SOLN
250.0000 mL | INTRAVENOUS | Status: DC
Start: 2011-10-24 — End: 2011-10-28
  Administered 2011-10-24: 250 mL via INTRAVENOUS

## 2011-10-24 SURGICAL SUPPLY — 66 items
ADH SKN CLS APL DERMABOND .7 (GAUZE/BANDAGES/DRESSINGS) ×1
BLADE SURG ROTATE 9660 (MISCELLANEOUS) IMPLANT
BUR EGG ELITE 4.0 (BURR) ×2 IMPLANT
CLOTH BEACON ORANGE TIMEOUT ST (SAFETY) ×2 IMPLANT
CLSR STERI-STRIP ANTIMIC 1/2X4 (GAUZE/BANDAGES/DRESSINGS) ×1 IMPLANT
CORDS BIPOLAR (ELECTRODE) ×2 IMPLANT
COVER MAYO STAND STRL (DRAPES) ×4 IMPLANT
COVER SURGICAL LIGHT HANDLE (MISCELLANEOUS) ×2 IMPLANT
DERMABOND ADVANCED (GAUZE/BANDAGES/DRESSINGS) ×1
DERMABOND ADVANCED .7 DNX12 (GAUZE/BANDAGES/DRESSINGS) ×1 IMPLANT
DRAPE C-ARM 42X72 X-RAY (DRAPES) ×2 IMPLANT
DRAPE ORTHO SPLIT 77X108 STRL (DRAPES) ×2
DRAPE POUCH INSTRU U-SHP 10X18 (DRAPES) ×2 IMPLANT
DRAPE SURG 17X23 STRL (DRAPES) ×2 IMPLANT
DRAPE SURG ORHT 6 SPLT 77X108 (DRAPES) ×1 IMPLANT
DRAPE U-SHAPE 47X51 STRL (DRAPES) ×2 IMPLANT
DRSG MEPILEX BORDER 4X8 (GAUZE/BANDAGES/DRESSINGS) ×2 IMPLANT
DURAPREP 26ML APPLICATOR (WOUND CARE) ×2 IMPLANT
ELECT BLADE 4.0 EZ CLEAN MEGAD (MISCELLANEOUS)
ELECT BLADE 6.5 EXT (BLADE) ×2 IMPLANT
ELECT REM PT RETURN 9FT ADLT (ELECTROSURGICAL) ×2
ELECTRODE BLDE 4.0 EZ CLN MEGD (MISCELLANEOUS) IMPLANT
ELECTRODE REM PT RTRN 9FT ADLT (ELECTROSURGICAL) ×1 IMPLANT
GLOVE BIOGEL PI IND STRL 6.5 (GLOVE) ×1 IMPLANT
GLOVE BIOGEL PI IND STRL 8.5 (GLOVE) ×1 IMPLANT
GLOVE BIOGEL PI INDICATOR 6.5 (GLOVE) ×1
GLOVE BIOGEL PI INDICATOR 8.5 (GLOVE) ×1
GLOVE ECLIPSE 6.0 STRL STRAW (GLOVE) ×2 IMPLANT
GLOVE ECLIPSE 8.5 STRL (GLOVE) ×2 IMPLANT
GOWN PREVENTION PLUS XXLARGE (GOWN DISPOSABLE) ×2 IMPLANT
GOWN STRL NON-REIN LRG LVL3 (GOWN DISPOSABLE) ×4 IMPLANT
IV CATH 14GX2 1/4 (CATHETERS) IMPLANT
KIT BASIN OR (CUSTOM PROCEDURE TRAY) ×2 IMPLANT
KIT ORACLE NEUROMONITING (KITS) ×1 IMPLANT
KIT POSITION SURG JACKSON T1 (MISCELLANEOUS) ×2 IMPLANT
KIT ROOM TURNOVER OR (KITS) ×2 IMPLANT
KIT STIMULAN RAPID CURE 5CC (Orthopedic Implant) ×1 IMPLANT
Locking cap ×4 IMPLANT
NDL SPNL 18GX3.5 QUINCKE PK (NEEDLE) ×1 IMPLANT
NEEDLE 22X1 1/2 (OR ONLY) (NEEDLE) ×2 IMPLANT
NEEDLE SPNL 18GX3.5 QUINCKE PK (NEEDLE) ×2 IMPLANT
NS IRRIG 1000ML POUR BTL (IV SOLUTION) ×2 IMPLANT
PACK LAMINECTOMY ORTHO (CUSTOM PROCEDURE TRAY) ×2 IMPLANT
PACK UNIVERSAL I (CUSTOM PROCEDURE TRAY) ×2 IMPLANT
PAD ARMBOARD 7.5X6 YLW CONV (MISCELLANEOUS) ×4 IMPLANT
PATTIES SURGICAL .5 X.5 (GAUZE/BANDAGES/DRESSINGS) IMPLANT
PATTIES SURGICAL .5 X1 (DISPOSABLE) ×2 IMPLANT
ROD 50MM (Rod) ×4 IMPLANT
ROD SPNL CVD 50X5.5XHRD NS (Rod) IMPLANT
SCREW MATRIX MIS 6.0X40MM (Screw) ×4 IMPLANT
SPACER CONCORDE CUR 5DEG L9MM (Orthopedic Implant) ×1 IMPLANT
SPONGE LAP 4X18 X RAY DECT (DISPOSABLE) ×4 IMPLANT
SPONGE SURGIFOAM ABS GEL 100 (HEMOSTASIS) ×2 IMPLANT
STRIP CLOSURE SKIN 1/2X4 (GAUZE/BANDAGES/DRESSINGS) ×2 IMPLANT
SURGIFLO TRUKIT (HEMOSTASIS) ×2 IMPLANT
SUT MNCRL AB 3-0 PS2 18 (SUTURE) ×4 IMPLANT
SUT VIC AB 2-0 CT1 18 (SUTURE) ×2 IMPLANT
SUT VICRYL 0 UR6 27IN ABS (SUTURE) ×4 IMPLANT
SYR BULB IRRIGATION 50ML (SYRINGE) ×2 IMPLANT
SYR CONTROL 10ML LL (SYRINGE) ×2 IMPLANT
TAP CANN 5MM (TAP) ×1 IMPLANT
TOWEL OR 17X24 6PK STRL BLUE (TOWEL DISPOSABLE) ×2 IMPLANT
TOWEL OR 17X26 10 PK STRL BLUE (TOWEL DISPOSABLE) ×2 IMPLANT
TRAY FOLEY CATH 14FR (SET/KITS/TRAYS/PACK) ×2 IMPLANT
WATER STERILE IRR 1000ML POUR (IV SOLUTION) ×2 IMPLANT
YANKAUER SUCT BULB TIP NO VENT (SUCTIONS) ×2 IMPLANT

## 2011-10-24 NOTE — Anesthesia Procedure Notes (Signed)
Procedure Name: Intubation Date/Time: 10/24/2011 2:25 PM Performed by: Rossie Muskrat L Pre-anesthesia Checklist: Patient identified, Timeout performed, Emergency Drugs available, Suction available and Patient being monitored Patient Re-evaluated:Patient Re-evaluated prior to inductionOxygen Delivery Method: Circle system utilized Preoxygenation: Pre-oxygenation with 100% oxygen Intubation Type: IV induction Ventilation: Mask ventilation without difficulty Laryngoscope Size: Miller and 2 Grade View: Grade I Tube type: Oral Tube size: 7.5 mm Number of attempts: 1 Airway Equipment and Method: Stylet Placement Confirmation: ETT inserted through vocal cords under direct vision,  breath sounds checked- equal and bilateral and positive ETCO2 Secured at: 22 cm Tube secured with: Tape Dental Injury: Teeth and Oropharynx as per pre-operative assessment

## 2011-10-24 NOTE — Preoperative (Signed)
Beta Blockers   Reason not to administer Beta Blockers:Not Applicable 

## 2011-10-24 NOTE — Brief Op Note (Addendum)
10/24/2011  6:37 PM  PATIENT:  Kimberly Larsen  66 y.o. female  PRE-OPERATIVE DIAGNOSIS:  spinal stenosis L3-5 spondeylolesthesis L3-4  POST-OPERATIVE DIAGNOSIS:  spinal stenosis L3-5 spondeylolesthesis L3-4  PROCEDURE:  Procedure(s) (LRB): POSTERIOR LUMBAR FUSION 1 LEVEL (N/A) TLIF L3-4 Decompression L4-S1  SURGEON:  Surgeon(s) and Role:    * Venita Lick, MD - Primary  PHYSICIAN ASSISTANT:   ASSISTANTS: Norval Gable   ANESTHESIA:   general  EBL:  Total I/O In: 2700 [I.V.:2700] Out: 1250 [Urine:1000; Blood:250]  BLOOD ADMINISTERED:none  DRAINS: (1) Jackson-Pratt drain(s) with closed bulb suction in the back   LOCAL MEDICATIONS USED:  MARCAINE     SPECIMEN:  No Specimen  DISPOSITION OF SPECIMEN:  N/A  COUNTS:  YES  TOURNIQUET:  * No tourniquets in log *  DICTATION: .Other Dictation: Dictation Number [id number]  PLAN OF CARE: Admit to inpatient   PATIENT DISPOSITION:  PACU - hemodynamically stable.

## 2011-10-24 NOTE — Anesthesia Postprocedure Evaluation (Signed)
  Anesthesia Post-op Note  Patient: Kimberly Larsen  Procedure(s) Performed: Procedure(s) (LRB): POSTERIOR LUMBAR FUSION 1 LEVEL (N/A)  Patient Location: PACU  Anesthesia Type: General  Level of Consciousness: awake, alert  and oriented  Airway and Oxygen Therapy: Patient Spontanous Breathing and Patient connected to nasal cannula oxygen  Post-op Pain: moderate  Post-op Assessment: Post-op Vital signs reviewed  Post-op Vital Signs: Reviewed  Complications: No apparent anesthesia complications

## 2011-10-24 NOTE — Transfer of Care (Signed)
Immediate Anesthesia Transfer of Care Note  Patient: Kimberly Larsen  Procedure(s) Performed: Procedure(s) (LRB): POSTERIOR LUMBAR FUSION 1 LEVEL (N/A)  Patient Location: PACU  Anesthesia Type: General  Level of Consciousness: awake, alert , oriented and patient cooperative  Airway & Oxygen Therapy: Patient Spontanous Breathing and Patient connected to nasal cannula oxygen  Post-op Assessment: Report given to PACU RN, Post -op Vital signs reviewed and stable and Patient moving all extremities X 4  Post vital signs: Reviewed and stable  Complications: No apparent anesthesia complications

## 2011-10-24 NOTE — H&P (Signed)
No change in clinical exam H+P reviewed  

## 2011-10-24 NOTE — Anesthesia Preprocedure Evaluation (Signed)
Anesthesia Evaluation  Patient identified by MRN, date of birth, ID band Patient awake    Reviewed: Allergy & Precautions, H&P , NPO status , Patient's Chart, lab work & pertinent test results  Airway Mallampati: II TM Distance: >3 FB Neck ROM: Full    Dental  (+) Teeth Intact and Dental Advisory Given   Pulmonary neg pulmonary ROS,  breath sounds clear to auscultation  Pulmonary exam normal       Cardiovascular hypertension, Pt. on medications Rhythm:Regular Rate:Normal     Neuro/Psych    GI/Hepatic negative GI ROS, Neg liver ROS,   Endo/Other  negative endocrine ROS  Renal/GU negative Renal ROS     Musculoskeletal   Abdominal   Peds  Hematology   Anesthesia Other Findings   Reproductive/Obstetrics                           Anesthesia Physical Anesthesia Plan  ASA: II  Anesthesia Plan: General   Post-op Pain Management:    Induction: Intravenous  Airway Management Planned: Oral ETT  Additional Equipment:   Intra-op Plan:   Post-operative Plan: Extubation in OR  Informed Consent: I have reviewed the patients History and Physical, chart, labs and discussed the procedure including the risks, benefits and alternatives for the proposed anesthesia with the patient or authorized representative who has indicated his/her understanding and acceptance.   Dental advisory given  Plan Discussed with: CRNA, Anesthesiologist and Surgeon  Anesthesia Plan Comments:         Anesthesia Quick Evaluation

## 2011-10-25 ENCOUNTER — Inpatient Hospital Stay (HOSPITAL_COMMUNITY): Payer: BC Managed Care – PPO

## 2011-10-25 MED ORDER — METHOCARBAMOL 500 MG PO TABS: 500.0000 mg | ORAL_TABLET | Freq: Three times a day (TID) | ORAL | Status: DC

## 2011-10-25 MED ORDER — OXYCODONE-ACETAMINOPHEN 10-325 MG PO TABS: 1.0000 | ORAL_TABLET | Freq: Four times a day (QID) | ORAL | Status: DC | PRN

## 2011-10-25 MED ORDER — MORPHINE SULFATE 2 MG/ML IJ SOLN
2.0000 mg | INTRAMUSCULAR | Status: DC | PRN
  Administered 2011-10-25: 2 mg via INTRAVENOUS
  Filled 2011-10-25: qty 1

## 2011-10-25 MED ORDER — ONDANSETRON HCL 4 MG PO TABS: 4.0000 mg | ORAL_TABLET | Freq: Three times a day (TID) | ORAL | Status: DC | PRN

## 2011-10-25 MED ORDER — POLYETHYLENE GLYCOL 3350 17 G PO PACK: 17.0000 g | PACK | Freq: Every day | ORAL | Status: DC

## 2011-10-25 MED ORDER — CEFAZOLIN SODIUM 1-5 GM-% IV SOLN
1.0000 g | Freq: Three times a day (TID) | INTRAVENOUS | Status: AC
  Administered 2011-10-25: 1 g via INTRAVENOUS
  Filled 2011-10-25 (×3): qty 50

## 2011-10-25 NOTE — Evaluation (Signed)
Physical Therapy Evaluation Patient Details Name: Kimberly Larsen MRN: [id number] DOB: May 16, 1945 Today's Date: 10/25/2011 Time: 1191-4782 PT Time Calculation (min): 24 min  PT Assessment / Plan / Recommendation Clinical Impression  pt presents s/p L3-5 Decompression.  pt very motivated and anticipate good progress to return home.      PT Assessment  Patient needs continued PT services    Follow Up Recommendations  Home health PT    Barriers to Discharge None      Equipment Recommendations  None recommended by PT    Recommendations for Other Services     Frequency Min 5X/week    Precautions / Restrictions Precautions Precautions: Back Precaution Booklet Issued: Yes (comment) Required Braces or Orthoses: Spinal Brace Spinal Brace: Lumbar corset;Applied in sitting position Restrictions Weight Bearing Restrictions: No   Pertinent Vitals/Pain 5/10 low back.        Mobility  Bed Mobility Bed Mobility: Not assessed (Sitting EOB with OT.  ) Transfers Transfers: Sit to Stand;Stand to Sit Sit to Stand: 4: Min assist;With upper extremity assist;From bed Stand to Sit: 4: Min assist;With upper extremity assist;With armrests;To chair/3-in-1 Details for Transfer Assistance: cues for use of UEs, anterior wt shift with sit to stand.   Ambulation/Gait Ambulation/Gait Assistance: 4: Min assist Ambulation Distance (Feet): 20 Feet (x2) Assistive device: 1 person hand held assist Ambulation/Gait Assistance Details: pt moves slowly, but seems to improve with increased time up.   Gait Pattern: Step-through pattern;Decreased stride length Stairs: No Wheelchair Mobility Wheelchair Mobility: No    Exercises     PT Diagnosis: Difficulty walking;Acute pain  PT Problem List: Decreased activity tolerance;Decreased balance;Decreased mobility;Decreased knowledge of use of DME;Decreased knowledge of precautions;Pain PT Treatment Interventions: DME instruction;Gait training;Stair  training;Functional mobility training;Therapeutic activities;Therapeutic exercise;Balance training;Patient/family education   PT Goals Acute Rehab PT Goals PT Goal Formulation: With patient Time For Goal Achievement: 11/01/11 Potential to Achieve Goals: Good Pt will Roll Supine to Right Side: Independently PT Goal: Rolling Supine to Right Side - Progress: Goal set today Pt will Roll Supine to Left Side: Independently PT Goal: Rolling Supine to Left Side - Progress: Goal set today Pt will go Supine/Side to Sit: Independently PT Goal: Supine/Side to Sit - Progress: Goal set today Pt will go Sit to Supine/Side: Independently PT Goal: Sit to Supine/Side - Progress: Goal set today Pt will go Sit to Stand: with modified independence PT Goal: Sit to Stand - Progress: Goal set today Pt will go Stand to Sit: with modified independence PT Goal: Stand to Sit - Progress: Goal set today Pt will Ambulate: >150 feet;Independently PT Goal: Ambulate - Progress: Goal set today Pt will Go Up / Down Stairs: 3-5 stairs;with supervision;with rail(s) PT Goal: Up/Down Stairs - Progress: Goal set today Additional Goals Additional Goal #1: pt will verbalize and follow back precautions.   PT Goal: Additional Goal #1 - Progress: Goal set today  Visit Information  Last PT Received On: 10/25/11 Assistance Needed: +1 PT/OT Co-Evaluation/Treatment: Yes    Subjective Data  Subjective: I haven't been up yet.   Patient Stated Goal: Home   Prior Functioning  Home Living Lives With: Alone Available Help at Discharge: Family;Friend(s);Available PRN/intermittently Type of Home: House Home Access: Stairs to enter [employer] of Steps: 3 Entrance Stairs-Rails: Right Home Layout: One level Bathroom Shower/Tub: Health visitor: Standard Home Adaptive Equipment: Shower chair with back Prior Function Level of Independence: Independent Able to Take Stairs?: Yes Driving:  Yes Communication Communication: No difficulties  Cognition  Overall Cognitive Status: Appears within functional limits for tasks assessed/performed Arousal/Alertness: Awake/alert Orientation Level: Oriented X4 / Intact Behavior During Session: [facility] for tasks performed    Extremity/Trunk Assessment Right Lower Extremity Assessment RLE ROM/Strength/Tone: WFL for tasks assessed RLE Sensation: WFL - Light Touch Left Lower Extremity Assessment LLE ROM/Strength/Tone: WFL for tasks assessed LLE Sensation: WFL - Light Touch   Balance Balance Balance Assessed: No  End of Session PT - End of Session Equipment Utilized During Treatment: Gait belt;Back brace Activity Tolerance: Patient tolerated treatment well Patient left: in chair;with call bell/phone within reach Nurse Communication: Mobility status  GP     Sunny Schlein, [place] [phone] 10/25/2011, 1:17 PM

## 2011-10-25 NOTE — Evaluation (Signed)
Occupational Therapy Evaluation Patient Details Name: Kimberly Larsen MRN: [id number] DOB: Dec 15, 1945 Today's Date: 10/25/2011 Time: 4098-1191 OT Time Calculation (min): 23 min  OT Assessment / Plan / Recommendation Clinical Impression  This 66 yo female s/p L3-L5 diskectomy and decompression presents to acute OT with problems below. Will benefit from acute OT with follow up HHOT to get to an I/Mod I.    OT Assessment  Patient needs continued OT Services    Follow Up Recommendations  Home health OT    Barriers to Discharge None    Equipment Recommendations  3 in 1 bedside comode    Recommendations for Other Services    Frequency  Min 2X/week    Precautions / Restrictions Precautions Precautions: Back Precaution Booklet Issued: Yes (comment) Required Braces or Orthoses: Spinal Brace Spinal Brace: Lumbar corset;Applied in sitting position Restrictions Weight Bearing Restrictions: No       ADL  Eating/Feeding: Simulated;Independent Where Assessed - Eating/Feeding: Chair Grooming: Simulated;Set up Where Assessed - Grooming: Supported sitting Upper Body Bathing: Simulated;Set up Where Assessed - Upper Body Bathing: Supported sitting Lower Body Bathing: Simulated;Moderate assistance Where Assessed - Lower Body Bathing: Supported sit to stand Upper Body Dressing: Simulated;Set up Where Assessed - Upper Body Dressing: Supported sit to stand Lower Body Dressing: Simulated;+1 Total assistance Where Assessed - Lower Body Dressing: Supported sit to stand Toilet Transfer: Minimal assistance;Performed Statistician Method: Sit to Barista: Comfort height toilet;Grab bars Toileting - Architect and Hygiene: Performed;Minimal assistance Where Assessed - Engineer, mining and Hygiene: Standing Equipment Used: Back brace Transfers/Ambulation Related to ADLs: Min A    OT Diagnosis: Generalized weakness;Acute pain  OT Problem List:  Decreased strength;Pain;Decreased knowledge of use of DME or AE;Decreased knowledge of precautions OT Treatment Interventions: Self-care/ADL training;DME and/or AE instruction;Patient/family education;Balance training   OT Goals Acute Rehab OT Goals OT Goal Formulation: With patient Time For Goal Achievement: 10/27/11 Potential to Achieve Goals: Good ADL Goals Pt Will Perform Grooming: Independently;Unsupported;Standing at sink (2 tasks) ADL Goal: Grooming - Progress: Goal set today Pt Will Perform Lower Body Bathing: with modified independence;Unsupported;Sit to stand from chair;Sit to stand from bed;with adaptive equipment ADL Goal: Lower Body Bathing - Progress: Goal set today Pt Will Perform Lower Body Dressing: with modified independence;Unsupported;Sit to stand from bed;Sit to stand from chair;with adaptive equipment ADL Goal: Lower Body Dressing - Progress: Goal set today Pt Will Transfer to Toilet: with modified independence;Ambulation;with DME;3-in-1 ADL Goal: Toilet Transfer - Progress: Goal set today Pt Will Perform Toileting - Clothing Manipulation: Independently;Standing ADL Goal: Toileting - Clothing Manipulation - Progress: Goal set today Pt Will Perform Toileting - Hygiene: Independently;Standing at 3-in-1/toilet ADL Goal: Toileting - Hygiene - Progress: Goal set today Pt Will Perform Tub/Shower Transfer: with modified independence;Ambulation;with DME;Maintaining back safety precautions (to 3-n-1) ADL Goal: Tub/Shower Transfer - Progress: Goal set today Miscellaneous OT Goals Miscellaneous OT Goal #1: Pt will be able to don/doff back brace independently. OT Goal: Miscellaneous Goal #1 - Progress: Goal set today Miscellaneous OT Goal #2: Pt will be able to get in/OOB at a Mod I level for BADLs. OT Goal: Miscellaneous Goal #2 - Progress: Goal set today  Visit Information  Last OT Received On: 10/25/11 Assistance Needed: +1 PT/OT Co-Evaluation/Treatment: Yes      Subjective Data  Patient Stated Goal: To get back to work in 6 weeks hopefully   Prior Functioning  Home Living Lives With: Alone Available Help at Discharge: Family;Friend(s);Available PRN/intermittently Type of Home: House  Home Access: Stairs to enter Entrance Stairs-Number of Steps: 3 Entrance Stairs-Rails: Right Home Layout: One level Bathroom Shower/Tub: Health visitor: Standard Home Adaptive Equipment: Shower chair with back Prior Function Level of Independence: Independent Able to Take Stairs?: Yes Driving: Yes Vocation: 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 Dominant Hand: Right    Cognition  Overall Cognitive Status: Appears within functional limits for tasks assessed/performed Arousal/Alertness: Awake/alert Orientation Level: Appears intact for tasks assessed Behavior During Session: [facility] for tasks performed    Extremity/Trunk Assessment Right Upper Extremity Assessment RUE ROM/Strength/Tone: Within functional levels Left Upper Extremity Assessment LUE ROM/Strength/Tone: Within functional levels Right Lower Extremity Assessment RLE ROM/Strength/Tone: WFL for tasks assessed RLE Sensation: WFL - Light Touch Left Lower Extremity Assessment LLE ROM/Strength/Tone: WFL for tasks assessed LLE Sensation: WFL - Light Touch   Mobility Bed Mobility Bed Mobility: Rolling Left;Left Sidelying to Sit Rolling Left: 4: Min guard Left Sidelying to Sit: 4: Min assist;HOB flat Transfers Transfers: Sit to Stand;Stand to Sit Sit to Stand: 4: Min assist;With upper extremity assist;From bed Stand to Sit: 4: Min assist;With upper extremity assist;With armrests;To chair/3-in-1 Details for Transfer Assistance: cues for use of UEs, anterior wt shift with sit to stand.     Exercise    Balance Balance Balance Assessed: No  End of Session OT - End of Session Equipment Utilized During Treatment: Back  brace Activity Tolerance: Patient tolerated treatment well Patient left: in chair;with call bell/phone within reach  GO     Evette Georges [phone] 10/25/2011, 1:47 PM

## 2011-10-25 NOTE — Progress Notes (Signed)
    Subjective: Procedure(s) (LRB): POSTERIOR LUMBAR FUSION 1 LEVEL (N/A) 1 Day Post-Op  Patient reports pain as 6 on 0-10 scale.  Reports decreased leg pain denies incisional back pain   Negative void Negative bowel movement Negative flatus Negative chest pain or shortness of breath  Objective: Vital signs in last 24 hours: Temp:  [97.9 F (36.6 C)-98.8 F (37.1 C)] 98 F (36.7 C) (06/28 0500) Pulse Rate:  [83-98] 83  (06/28 0500) Resp:  [10-20] 14  (06/28 0500) BP: (116-138)/(51-81) 120/58 mmHg (06/28 0500) SpO2:  [94 %-100 %] 98 % (06/28 0500)  Intake/Output from previous day: 06/27 0701 - 06/28 0700 In: 2801 [I.V.:2801] Out: 2907 [Urine:2500; Drains:157; Blood:250]  Labs:  [facility] 10/22/11 0857  WBC 12.0*  RBC 4.51  HCT 42.5  PLT 319    Basename 10/22/11 0857  NA 140  K 3.2*  CL 100  CO2 30  BUN 21  CREATININE 0.70  GLUCOSE 91  CALCIUM 10.1    Basename 10/22/11 0857  LABPT --  INR 0.91    Physical Exam: Neurologically intact ABD soft Neurovascular intact Intact pulses distally Dorsiflexion/Plantar flexion intact Compartment soft Dressing C/D/I Drain - 157 overnight  Assessment/Plan: Patient stable  xrays pending Continue mobilization with physical therapy Continue care  Advance diet Up with therapy Discharge home with home health possibly on Sunday D/C drain in AM  Continue Abx today since drain remains  Venita Lick, MD [facility] [phone]

## 2011-10-25 NOTE — Progress Notes (Signed)
Utilization review completed. Ketrick Matney, RN, BSN. 

## 2011-10-25 NOTE — Discharge Summary (Signed)
[name] MRN: [id number] DOB/AGE: 05/30/1945 66 y.o.  Admit date: 10/24/2011 Discharge date: 10/28/2011   Admission Diagnoses:  Degenerative spondylolisthesis L3-4, degenerative spinal stenosis L3-4, L4-5.   Discharge Diagnoses:  Degenerative spondylolisthesis L3-4, degenerative spinal stenosis L3-4, L4-5 s/p  OPERATIVE PROCEDURE:  1. L3-L5 decompression.  2. Posterior lateral arthrodesis L3-4.  3. Posterior segmental instrumentation, L3-4 Gill decompression L3-4.  4. Complete diskectomy and insertion of intervertebral graft L3-4.   Past Medical History  Diagnosis Date  . Hypertension   . Kidney stones   . Arthritis   . HLD (hyperlipidemia)     Surgeries: Procedure(s): POSTERIOR LUMBAR FUSION 1 LEVEL on 10/24/2011   Consultants: none  Discharged Condition: Improved  Hospital Course: Kimberly Larsen is an 66 y.o. female who was admitted 10/24/2011 for operative treatment of degenerative spondylolisthesis L3-4, degenerative spinal stenosis L3-4, L4-5. Patient failed conservative treatments (please see the history and physical for the specifics) and had severe unremitting pain that affects sleep, daily activities and work/hobbies. After pre-op clearance, the patient was taken to the operating room on 10/24/2011 and underwent  Procedure(s): POSTERIOR LUMBAR FUSION 1 LEVEL.    Patient was given perioperative antibiotics: Anti-infectives     Start     Dose/Rate Route Frequency Ordered Stop   10/25/11 0800   ceFAZolin (ANCEF) IVPB 1 g/50 mL premix        1 g 100 mL/hr over 30 Minutes Intravenous Every 8 hours 10/25/11 0750 10/25/11 2359   10/24/11 2230   ceFAZolin (ANCEF) IVPB 1 g/50 mL premix        1 g 100 mL/hr over 30 Minutes Intravenous Every 8 hours 10/24/11 2125 10/25/11 0627   10/24/11 1642   vancomycin (VANCOCIN) powder  Status:  Discontinued          As needed 10/24/11 1642 10/24/11 1907   10/24/11 1640   gentamicin (GARAMYCIN) injection  Status:   Discontinued          As needed 10/24/11 1642 10/24/11 1907   10/24/11 1630   Vancomycin HCl POWD 500 mg  Status:  Discontinued        500 mg Does not apply To Surgery 10/24/11 1617 10/24/11 1620   10/24/11 1630   vancomycin (VANCOCIN) powder 500 mg        500 mg Other To Surgery 10/24/11 1621 10/25/11 1630   10/23/11 1445   ceFAZolin (ANCEF) IVPB 2 g/50 mL premix        2 g 100 mL/hr over 30 Minutes Intravenous 60 min pre-op 10/23/11 1445 10/24/11 1430           Patient was given sequential compression devices and early ambulation to prevent DVT.   Patient benefited maximally from hospital stay and there were no complications. At the time of discharge, the patient was urinating/moving their bowels without difficulty, tolerating a regular diet, pain is controlled with oral pain medications and they have been cleared by PT/OT.   Recent vital signs: Patient Vitals for the past 24 hrs:  BP Temp Temp src Pulse Resp SpO2  10/25/11 0500 120/58 mmHg 98 F (36.7 C) Oral 83  14  98 %  10/25/11 0400 - - - - 20  96 %  10/25/11 0230 116/51 mmHg 97.9 F (36.6 C) Oral 90  16  94 %  10/25/11 0000 - - - - 16  94 %  10/24/11 2100 132/55 mmHg 98.4 F (36.9 C) Oral 93  16  94 %  10/24/11 2030 126/66 mmHg 98.8 F (37.1 C) - 91  15  96 %  10/24/11 2015 129/56 mmHg - - 95  19  94 %  10/24/11 2000 130/63 mmHg - - 91  19  97 %  10/24/11 1945 118/81 mmHg - - 91  18  100 %  10/24/11 1940 - - - - 16  100 %  10/24/11 1930 127/51 mmHg - - 91  18  100 %  10/24/11 1913 138/61 mmHg 98 F (36.7 C) - 95  10  100 %  10/24/11 1019 133/79 mmHg 98.8 F (37.1 C) Oral 98  18  98 %     Recent laboratory studies:  Basename 10/22/11 0857  WBC 12.0*  HGB 14.0  HCT 42.5  PLT 319  NA 140  K 3.2*  CL 100  CO2 30  BUN 21  CREATININE 0.70  GLUCOSE 91  INR 0.91  CALCIUM 10.1     Discharge Medications:   Medication List  As of 10/25/2011  8:29 AM   STOP taking these medications         etodolac 500 MG  tablet      traMADol 50 MG tablet         TAKE these medications         ALPRAZolam 0.25 MG tablet   Commonly known as: XANAX   Take 0.25 mg by mouth at bedtime as needed.      aspirin EC 81 MG tablet   Take 81 mg by mouth daily.      CALCIUM CITRATE + D3 PO   Take 1 tablet by mouth 3 (three) times daily.      diphenhydrAMINE 25 mg capsule   Commonly known as: BENADRYL   Take 25 mg by mouth at bedtime.      DULoxetine 60 MG capsule   Commonly known as: CYMBALTA   Take 60 mg by mouth daily.      Fish Oil Oil   Take 2 capsules by mouth daily.      gabapentin 600 MG tablet   Commonly known as: NEURONTIN   Take 600 mg by mouth 4 (four) times daily.      Magnesium 400 MG Caps   Take 1 tablet by mouth daily.      Melatonin 3 MG Tabs   Take 1 tablet by mouth at bedtime.      methocarbamol 500 MG tablet   Commonly known as: ROBAXIN   Take 1 tablet (500 mg total) by mouth 3 (three) times daily. MAX 3 pills daily      multivitamin with minerals Tabs   Take 1 tablet by mouth daily.      ondansetron 4 MG tablet   Commonly known as: ZOFRAN   Take 1 tablet (4 mg total) by mouth every 8 (eight) hours as needed for nausea. MAX 3 pills daily      OVER THE COUNTER MEDICATION   Take 1 tablet by mouth daily. Ginkgo Biloba 60mg       oxyCODONE-acetaminophen 10-325 MG per tablet   Commonly known as: PERCOCET   Take 1 tablet by mouth every 6 (six) hours as needed for pain. MAX 4 pills daily      polyethylene glycol packet   Commonly known as: MIRALAX / GLYCOLAX   Take 17 g by mouth daily. Take 1 packet daily until bowels become regular      Potassium 99 MG Tabs   Take 1 tablet by mouth daily.  pravastatin 40 MG tablet   Commonly known as: PRAVACHOL   Take 40 mg by mouth daily.      quinapril-hydrochlorothiazide 20-12.5 MG per tablet   Commonly known as: ACCURETIC   Take 1 tablet by mouth daily.      SUPER B COMPLEX PO   Take 1 tablet by mouth daily.      vitamin  C 500 MG tablet   Commonly known as: ASCORBIC ACID   Take 500 mg by mouth daily.      Vitamin D3 2000 UNITS Tabs   Take 2 tablets by mouth daily.      Zinc 50 MG Caps   Take 1 capsule by mouth daily.            Diagnostic Studies:  Chest 2 View  10/24/2011  *RADIOLOGY REPORT*  Clinical Data: 66 year old female chest congestion and cough status post surgery.  CHEST - 2 VIEW  Comparison: 10/01/2011 and earlier.  Findings: Lordotic AP and lateral views of the chest. Stable cardiomegaly and mediastinal contours.  Visualized tracheal air column is within normal limits.  No pneumothorax, pulmonary edema or pleural effusion.  No confluent pulmonary opacity identified. Lumbar spine fusion hardware partially visible.  Tubing projects about the chest.  IMPRESSION: No acute cardiopulmonary abnormality.  Original Report Authenticated By: Harley Hallmark, M.D.   Dg Lumbar Spine 2-3 Views  10/24/2011  *RADIOLOGY REPORT*  Clinical Data: 66 year old female undergoing lumbar surgery.  LUMBAR SPINE - 2-3 VIEW, DG C-ARM 61-120 MIN  Comparison: Preoperative study 10/22/2011.  Fluoroscopy time of 1.5 minutes was utilized.  Findings: Two intraoperative fluoroscopic views of the lumbar spine demonstrate transpedicular hardware and interbody implant at the L3- L4 level.  Also this level is been decompressed posteriorly.  IMPRESSION: Fusion and decompression depicted at L3-L4.  Original Report Authenticated By: Harley Hallmark, M.D.   Dg Lumbar Spine 2-3 Views  10/22/2011  *RADIOLOGY REPORT*  Clinical Data: Preop for lumbar surgery.  LUMBAR SPINE - 2-3 VIEW  Comparison: None  Findings: There are five lumbar vertebral bodies.  Moderate degenerative lumbar spondylosis with disc disease and facet disease most notable at L5-S1.  IMPRESSION: Degenerative lumbar spondylosis. Five lumbar type non-rib bearing vertebral bodies.  Original Report Authenticated By: P. Loralie Champagne, M.D.   Dg C-arm 61-120 Min  10/24/2011   *RADIOLOGY REPORT*  Clinical Data: 66 year old female undergoing lumbar surgery.  LUMBAR SPINE - 2-3 VIEW, DG C-ARM 61-120 MIN  Comparison: Preoperative study 10/22/2011.  Fluoroscopy time of 1.5 minutes was utilized.  Findings: Two intraoperative fluoroscopic views of the lumbar spine demonstrate transpedicular hardware and interbody implant at the L3- L4 level.  Also this level is been decompressed posteriorly.  IMPRESSION: Fusion and decompression depicted at L3-L4.  Original Report Authenticated By: Harley Hallmark, M.D.    Discharge Orders    Future Orders Please Complete By Expires   Diet - low sodium heart healthy      Face-to-face encounter (required for Medicare/Medicaid patients)      Comments:   I Gwinda Maine certify that this patient is under my care and that I, or a nurse practitioner or physician's assistant working with me, had a face-to-face encounter that meets the physician face-to-face encounter requirements with this patient on 10/25/2011.   Questions: Responses:   The encounter with the patient was in whole, or in part, for the following medical condition, which is the primary reason for home health care lumbar DDD   I certify that, based  on my findings, the following services are medically necessary home health services Physical therapy   My clinical findings support the need for the above services OTHER SEE COMMENTS   Further, I certify that my clinical findings support that this patient is homebound (i.e. absences from home require considerable and taxing effort and are for medical reasons or religious services or infrequently or of short duration when for other reasons) Ambulates short distances less than 300 feet   To provide the following care/treatments PT    OT   Call MD / Call 911      Comments:   If you experience chest pain or shortness of breath, CALL 911 and be transported to the hospital emergency room.  If you develope a fever above 101 F, pus (white drainage)  or increased drainage or redness at the wound, or calf pain, call your surgeon's office.   Constipation Prevention      Comments:   Drink plenty of fluids.  Prune juice may be helpful.  You may use a stool softener, such as Colace (over the counter) 100 mg twice a day.  Use MiraLax (over the counter) for constipation as needed.   Increase activity slowly as tolerated      Discharge instructions      Comments:   Keep incision clean and dry.  Leave steri strips in place.  May shower 5 days from surgery; pat to dry following shower.  May redress with clean, dry dressing if you would like.  Do not apply any lotion/cream/ointment to the incision.   Driving restrictions      Comments:   No driving for 2 weeks.  Dr Shon Baton will discuss addition driving restrictions at your first post-op visit in 2 weeks.   Lifting restrictions      Comments:   No lifting anything greater than 5 pounds.  DO NOT reach overhead (above shoulder height).  NO bending, stooping or squatting.  Dr. Shon Baton will discuss additional lifting restrictions at your first post-op visit in 2 weeks.      Follow-up Information    Follow up with Alvy Beal, MD in 2 weeks.   Contact information:   [facility] [address], [address] [place] [address] [phone]          Discharge Plan:  discharge to Home with home health  Disposition: stable at the time of discharge     Signed: Gwinda Maine for Dr. Venita Lick [facility] [phone] 10/25/2011, 8:29 AM

## 2011-10-25 NOTE — Progress Notes (Signed)
Referral received for SNF. Chart reviewed and CSW has spoken with RNCM who indicates that patient is for DC to home with Home Health and DME.  CSW to sign off. Please re-consult if CSW needs arise.  Sharlon Pfohl T. Glady Ouderkirk, LCSWA  [phone]  

## 2011-10-25 NOTE — Op Note (Signed)
NAME:  Kimberly Larsen, BRADDY NO.:  [id number]  MEDICAL RECORD NO.:  [id number]  LOCATION:  [id number]                        FACILITY:  [facility]  PHYSICIAN:  Alvy Beal, MD    DATE OF BIRTH:  02-Apr-1946  DATE OF PROCEDURE:  10/24/2011 DATE OF DISCHARGE:                              OPERATIVE REPORT   PREOPERATIVE DIAGNOSES:  Degenerative spondylolisthesis L3-4, degenerative spinal stenosis L3-4, L4-5.  POSTOPERATIVE DIAGNOSES:  Degenerative spondylolisthesis L3-4, degenerative spinal stenosis L3-4, L4-5.  OPERATIVE PROCEDURE: 1. L3-L5 decompression. 2. Posterior lateral arthrodesis L3-4. 3. Posterior segmental instrumentation, L3-4 Gill decompression L3-4. 4. Complete diskectomy and insertion of intervertebral graft L3-4.  INSTRUMENTATION SYSTEM:  Synthes pedicle screw system matrix 40 mm length screws 6 diameter at L3 and L4 with 50 mm long rod.  No adverse intraoperative events.  FIRST ASSISTANT:  Norval Gable, PA  HISTORY:  This is a very pleasant woman who has been complaining of severe back, buttock, and bilateral leg pain, left worse than the right. Attempts at conservative management have failed to alleviate her symptoms.  As a result of the significant pain and loss of quality of life, we elected to proceed with surgery.  All appropriate risks, benefits, and alternatives were discussed with the patient and consent was obtained.  OPERATIVE NOTE:  The patient was brought to the operating room, placed supine on the operating table.  After successful induction of general anesthesia and endotracheal  intubation, TEDs, SCDs, and Foley were inserted.  All appropriate neuromonitoring needles were placed by the neuro team representative for intraoperative monitoring.  The patient was turned prone onto the Wilson frame.  All bony prominences were well padded.  The back was prepped and draped in a standard fashion.  Once this was completed, a time-out was done  to confirm patient, procedure, and all other pertinent important data. Once that was completed, fluoro was used to identify the L3 and L4 pedicles.  Once this was done, a midline incision was made starting at the superior aspect of L3 and proceeding to the inferior aspect of L5. Sharp dissection was carried out down to the deep fascia.  The deep fascia was sharply incised and then I stripped the paraspinal muscles to expose the L3, L4, and L5 spinous processes.  Once this was done, I then dissected out over the facet complex to expose the L3 and L4 transverse processes.  The L3-4 facet capsule was also resected in its entirety. Once this was completed, and I confirmed using fluoroscopy.  The L3 pedicle, I then used awl placed at the junction of the transverse process and lateral aspect of the facet.  I then advanced it through the pedicle and confirmed trajectory with AP and lateral fluoroscopy.  Once this was completed, I advanced the trocar through the pedicle and down into the vertebral body.  I then removed the pedicle probe and palpated the hole with a ball-tip feeler.  I then tapped and then re-palpated the hole of ball-tip feeler.  There was no loss of integrity of the pedicle. I then placed the L3 pedicle screw on the right-hand side.  Once this was placed, I then stimulated it  to confirm that there was no breach. Once this was done, I repeated the process in its entirety at the L4 level on the right side.  Once I had the unilateral pedicle screw construct in place, I then proceeded with the decompression.  I removed the spinous process of L4 and majority that of L3 and kept bone graft. I then elected to take down the L5 spinous process as well to add for my bone graft.  I then did a complete laminectomy.  I then used a nerve curette to develop a plane underneath the L4 lamina, and then performed a complete laminectomy.  This was done with a combination of Kerrison rongeurs.   Using 2 and 3 mm Kerrison rongeurs and a Public house manager, developing a plane.  I removed the entire L4 lamina.  Once the central decompression was complete, I did notice there was still some stenosis in the lateral recess.  Because of this, I elected to perform an L5 laminectomy as well.  Using the same technique, I performed a complete laminectomy of L5, and then I had adequate decompression of the L5 and S1 nerve roots in the lateral gutter.  Once I had the central decompression complete, I then proceeded and did a partial laminotomy of L3.  At this point, I had the central decompression complete since the left side was the more symptomatic of the 2.  I then proceeded into the lateral gutter at the L5 level, I decompressed the S1 nerve root in the lateral recess by resecting the osteophyte using a 2 and 3 mm Kerrison rongeur.  I then proceeded superiorly and I performed a generous foraminotomy at the L5 level.  I then continued superiorly resecting the osteophyte.  Once I was up to the L3 pedicle, I then resected the entire L3 pars and unroofed the L3 nerve root in the foramen.  I then removed the inferior L3 facet in its entirety.  I then removed the osteophyte from the superior L4 facet.  At this point, I could palpate the L4 and visualized the L4 pedicle, as well as the L3 pedicle.  I can also visualize the L4 nerve root and the L3 nerve root and the disk.  I then used bipolar electrocautery to obtain hemostasis, and then protected the traversing and exiting nerve root and thecal sac.  At this point, I then incised the disk space and then using a combination of pituitary rongeurs, curettes, and Kerrison rongeurs, I removed all the disk material.  At this point, I then used a raspers and curettes to remove the subchondral bone.  There was a defect in the superior L4 endplate, but I was able to pack bone graft into this defect.  I felt as though the T lift cage would provide a better  fusion bed.  I then trialed different sizes and elected to use a 9 radiolucent DePuy TLIF cage.  I then packed local bone into the interbody space along the anterior anulus and then packed the local bone into the cage.  It was malleted to the appropriate depth.  I did get excellent fixation and depth with the cage.  Once the cage was seated properly, I irrigated copiously with normal saline.  At this point, under direct visualization.  Using the same technique, I used on the contralateral side, I placed the L3 and L4 pedicles.  Once these were placed, I then directly visualized the medial and inferior border of the L3 and L4 pedicles and confirmed  that there was no breach.  I then stimulated each screw again as another check. Once this was done, I then secured the rod to the screws and torqued all of the top locking nuts.  I had excellent fixation at this point, I then decorticated the transverse processes of L3-L4 on the right-hand side, and then packed the remainder of the local bone on the posterior lateral gutter on that right side.  I then palpated in the lateral recess on the right side with a [facility], checking the L3-L4 and L5 and the S1 nerve roots to confirm satisfactory decompression on the right side. Once this was completed, I recheck the left side and again each nerve was freely decompressed.  The wound was irrigated.  Hemostasis was obtained using bipolar electrocautery, and then I placed antibiotic beads in the wound to prevent postoperative infection and a drain.  I then closed the deep fascia with interrupted #1 Vicryl sutures, 2-0 Vicryl sutures for the superficial and 3-0 Monocryl for the skin.  Steri- Strips and dry dressing were applied.  The patient was extubated, transferred to PACU without incident.  At the end of the case, all needle and sponge counts were correct.  There was no adverse intraoperative events.     Alvy Beal, MD     DDB/MEDQ   D:  10/24/2011  T:  10/25/2011  Job:  [id number]

## 2011-10-26 NOTE — Progress Notes (Signed)
Physical Therapy Treatment Patient Details Name: Kimberly Larsen MRN: [id number] DOB: August 07, 1945 Today's Date: 10/26/2011 Time: 1045-1100 PT Time Calculation (min): 15 min  PT Assessment / Plan / Recommendation Comments on Treatment Session  Pt admitted s/p L3-4 fusion and continues to progress.  Pt able to tolerate increased ambulation distance with increased independence today as well as stair negotiation.  Motivated.    Follow Up Recommendations  Home health PT    Barriers to Discharge        Equipment Recommendations  None recommended by PT    Recommendations for Other Services    Frequency Min 5X/week   Plan Discharge plan remains appropriate;Frequency remains appropriate    Precautions / Restrictions Precautions Precautions: Back Precaution Booklet Issued: No Precaution Comments: Pt able to recall 3/3 back precautions. Required Braces or Orthoses: Spinal Brace Spinal Brace: Lumbar corset;Applied in sitting position Restrictions Weight Bearing Restrictions: No   Pertinent Vitals/Pain 5/10 in back.  Pt repositioned and RN made aware.    Mobility  Bed Mobility Bed Mobility: Rolling Right;Right Sidelying to Sit;Sit to Sidelying Right;Rolling Left Rolling Right: 6: Modified independent (Device/Increase time) Rolling Left: 6: Modified independent (Device/Increase time) Right Sidelying to Sit: 5: Supervision;HOB flat Sit to Sidelying Right: 5: Supervision;HOB flat Details for Bed Mobility Assistance: Cues for safe sequence to protect back. Transfers Transfers: Sit to Stand;Stand to Sit Sit to Stand: 5: Supervision;With upper extremity assist;From bed Stand to Sit: 5: Supervision;With upper extremity assist;To bed Details for Transfer Assistance: Verbal cues for safest hand placement. Ambulation/Gait Ambulation/Gait Assistance: 4: Min guard Ambulation Distance (Feet): 500 Feet Assistive device: None Ambulation/Gait Assistance Details: Guarding for balance with cues for  tall posture. Gait Pattern: Step-through pattern;Decreased stride length Stairs: Yes Stairs Assistance: 5: Supervision Stairs Assistance Details (indicate cue type and reason): Verbal cues for protection to back with proper posture. Stair Management Technique: Two rails;Step to pattern;Forwards Number of Stairs: 3  Wheelchair Mobility Wheelchair Mobility: No    Exercises     PT Diagnosis:    PT Problem List:   PT Treatment Interventions:     PT Goals Acute Rehab PT Goals PT Goal Formulation: With patient Time For Goal Achievement: 11/01/11 Potential to Achieve Goals: Good PT Goal: Rolling Supine to Right Side - Progress: Progressing toward goal PT Goal: Supine/Side to Sit - Progress: Progressing toward goal PT Goal: Sit to Supine/Side - Progress: Progressing toward goal PT Goal: Sit to Stand - Progress: Progressing toward goal PT Goal: Stand to Sit - Progress: Progressing toward goal PT Goal: Ambulate - Progress: Progressing toward goal PT Goal: Up/Down Stairs - Progress: Met Additional Goals PT Goal: Additional Goal #1 - Progress: Met  Visit Information  Last PT Received On: 10/26/11 Assistance Needed: +1    Subjective Data  Subjective: "Let's go for it." Patient Stated Goal: Home   Cognition  Overall Cognitive Status: Appears within functional limits for tasks assessed/performed Arousal/Alertness: Awake/alert Orientation Level: Appears intact for tasks assessed Behavior During Session: [facility] for tasks performed    Balance  Balance Balance Assessed: No  End of Session PT - End of Session Equipment Utilized During Treatment: Gait belt;Back brace Activity Tolerance: Patient tolerated treatment well Patient left: in bed;with call bell/phone within reach Nurse Communication: Mobility status   GP     Cephus Shelling 10/26/2011, 1:06 PM  10/26/2011 Cephus Shelling, PT, DPT [phone]

## 2011-10-26 NOTE — Progress Notes (Signed)
Occupational Therapy Treatment Patient Details Name: Kimberly Larsen MRN: [id number] DOB: 1945/11/21 Today's Date: 10/26/2011 Time: 4098-1191 OT Time Calculation (min): 27 min  OT Assessment / Plan / Recommendation Comments on Treatment Session Pt progressing well with therapy.     Follow Up Recommendations  Home health OT    Barriers to Discharge       Equipment Recommendations  None recommended by PT;3 in 1 bedside comode    Recommendations for Other Services    Frequency     Plan Discharge plan remains appropriate    Precautions / Restrictions Precautions Precautions: Back Precaution Booklet Issued: No Precaution Comments: Pt able to recall 3/3 back precautions. Required Braces or Orthoses: Spinal Brace Spinal Brace: Lumbar corset;Applied in sitting position Restrictions Weight Bearing Restrictions: No   Pertinent Vitals/Pain Pt reported 3/10 back pain with activity. Premedicated    ADL  Grooming: Performed;Independent Where Assessed - Grooming: Unsupported standing Toilet Transfer: Research scientist (life sciences) Method: Sit to Barista: Raised toilet seat with arms (or 3-in-1 over toilet) Toileting - Clothing Manipulation and Hygiene: Performed;Supervision/safety Where Assessed - Engineer, mining and Hygiene: Standing Tub/Shower Transfer: Engineer, manufacturing Method: Science writer: Walk in shower Equipment Used: Back brace;Gait belt Transfers/Ambulation Related to ADLs: Supervision/Min guard A with ambulation throughout room with no AD ADL Comments: Educated pt on: how to don/doff back brace, AE for use with ADLs, shower transfer and on IADL adaptations.     OT Diagnosis:    OT Problem List:   OT Treatment Interventions:     OT Goals ADL Goals ADL Goal: Lower Body Bathing - Progress: Progressing toward goals ADL Goal: Lower Body Dressing - Progress:  Progressing toward goals ADL Goal: Toilet Transfer - Progress: Progressing toward goals ADL Goal: Toileting - Clothing Manipulation - Progress: Progressing toward goals ADL Goal: Toileting - Hygiene - Progress: Progressing toward goals ADL Goal: Tub/Shower Transfer - Progress: Progressing toward goals Miscellaneous OT Goals OT Goal: Miscellaneous Goal #1 - Progress: Progressing toward goals OT Goal: Miscellaneous Goal #2 - Progress: Progressing toward goals  Visit Information  Last OT Received On: 10/26/11 Assistance Needed: +1    Cognition  Overall Cognitive Status: Appears within functional limits for tasks assessed/performed Arousal/Alertness: Awake/alert Orientation Level: Appears intact for tasks assessed Behavior During Session: [facility] for tasks performed    Mobility Bed Mobility Bed Mobility: Sit to Sidelying Left Rolling Right: 6: Modified independent (Device/Increase time) Rolling Left: 6: Modified independent (Device/Increase time) Right Sidelying to Sit: 5: Supervision;HOB flat Left Sidelying to Sit: 5: Supervision;HOB flat;With rails Sit to Sidelying Right: 5: Supervision;HOB flat Sit to Sidelying Left: 5: Supervision;With rail;HOB flat Details for Bed Mobility Assistance: Pt did well with sequencing Transfers Sit to Stand: 5: Supervision;From bed;From chair/3-in-1 Stand to Sit: 5: Supervision;To bed;To chair/3-in-1 Details for Transfer Assistance: Verbal cues for safest hand placement.   Exercises    Balance Balance Balance Assessed: No  End of Session OT - End of Session Equipment Utilized During Treatment: Back brace Activity Tolerance: Patient tolerated treatment well Patient left: in bed;with call bell/phone within reach  GO     Skeeter Sheard 10/26/2011, 2:02 PM

## 2011-10-26 NOTE — Progress Notes (Signed)
Subjective: Drain Dcd and wound looks fine. Moves legs well.   Objective: Vital signs in last 24 hours: Temp:  [98.1 F (36.7 C)-99.2 F (37.3 C)] 98.1 F (36.7 C) (06/29 0618) Pulse Rate:  [88-89] 89  (06/29 0618) Resp:  [16-18] 16  (06/29 0618) BP: (115-147)/(57-72) 147/72 mmHg (06/29 0618) SpO2:  [95 %-98 %] 98 % (06/29 0618)  Intake/Output from previous day: 06/28 0701 - 06/29 0700 In: 480 [P.O.:480] Out: 200 [Drains:200] Intake/Output this shift: Total I/O In: 480 [P.O.:480] Out: 30 [Drains:30]  No results found for this basename: HGB:5 in the last 72 hours No results found for this basename: WBC:2,RBC:2,HCT:2,PLT:2 in the last 72 hours No results found for this basename: NA:2,K:2,CL:2,CO2:2,BUN:2,CREATININE:2,GLUCOSE:2,CALCIUM:2 in the last 72 hours No results found for this basename: LABPT:2,INR:2 in the last 72 hours  Neurologically intact No cellulitis present  Assessment/Plan: Dr. Shon Baton will follow.   Kimberly Larsen A 10/26/2011, 10:16 AM

## 2011-10-27 MED ORDER — DIAZEPAM 5 MG PO TABS
5.0000 mg | ORAL_TABLET | Freq: Three times a day (TID) | ORAL | Status: DC | PRN
  Administered 2011-10-27 – 2011-10-28 (×3): 5 mg via ORAL
  Filled 2011-10-27 (×3): qty 1

## 2011-10-27 NOTE — Progress Notes (Signed)
Physical Therapy Treatment Patient Details Name: Kimberly Larsen MRN: [id number] DOB: [birth year] Today's Date: 10/27/2011 Time: 4098-1191 PT Time Calculation (min): 23 min  PT Assessment / Plan / Recommendation Comments on Treatment Session  Pt admitted s/p L3-4 fusion and remains very motivated.  Able to tolerate ambulation today with eventual increased independence.  Limited slightly by pain.    Follow Up Recommendations  Home health PT    Barriers to Discharge        Equipment Recommendations  None recommended by PT;3 in 1 bedside comode    Recommendations for Other Services    Frequency Min 5X/week   Plan Discharge plan remains appropriate;Frequency remains appropriate    Precautions / Restrictions Precautions Precautions: Back Precaution Booklet Issued: No Precaution Comments: Pt able to recall 3/3 back precautions. Required Braces or Orthoses: Spinal Brace Spinal Brace: Lumbar corset;Applied in sitting position Restrictions Weight Bearing Restrictions: No   Pertinent Vitals/Pain 9/10 in back.  Pt repositioned and RN aware.    Mobility  Bed Mobility Bed Mobility: Rolling Right;Right Sidelying to Sit Rolling Right: 6: Modified independent (Device/Increase time) Right Sidelying to Sit: 6: Modified independent (Device/Increase time);HOB flat Transfers Transfers: Sit to Stand;Stand to Sit (2 trials.) Sit to Stand: 4: Min assist;With upper extremity assist;From bed;From chair/3-in-1 (Min a due to pain.  Able to progress to mod I.) Stand to Sit: 6: Modified independent (Device/Increase time);With upper extremity assist;To chair/3-in-1 Details for Transfer Assistance: Assist for sit to stand initially due to pain.  Cues for sequence. Ambulation/Gait Ambulation/Gait Assistance: 4: Min assist (Able to progress to supervision.) Ambulation Distance (Feet): 350 Feet Assistive device: 1 person hand held assist Ambulation/Gait Assistance Details: Occassional assist today due to  pain with pt able to progress to supervision at times. Gait Pattern: Step-through pattern;Decreased stride length Stairs: No Wheelchair Mobility Wheelchair Mobility: No    Exercises     PT Diagnosis:    PT Problem List:   PT Treatment Interventions:     PT Goals Acute Rehab PT Goals PT Goal Formulation: With patient Time For Goal Achievement: 11/01/11 Potential to Achieve Goals: Good PT Goal: Rolling Supine to Right Side - Progress: Progressing toward goal PT Goal: Supine/Side to Sit - Progress: Progressing toward goal PT Goal: Sit to Stand - Progress: Progressing toward goal PT Goal: Stand to Sit - Progress: Progressing toward goal PT Goal: Ambulate - Progress: Progressing toward goal Additional Goals PT Goal: Additional Goal #1 - Progress: Met  Visit Information  Last PT Received On: 10/27/11 Assistance Needed: +1    Subjective Data  Subjective: "I don't know that I can go home today." Patient Stated Goal: Home   Cognition  Overall Cognitive Status: Appears within functional limits for tasks assessed/performed Arousal/Alertness: Awake/alert Orientation Level: Appears intact for tasks assessed Behavior During Session: [facility] for tasks performed    Balance  Balance Balance Assessed: No  End of Session PT - End of Session Equipment Utilized During Treatment: Gait belt;Back brace Activity Tolerance: Patient tolerated treatment well Patient left: in chair;with call bell/phone within reach Nurse Communication: Mobility status   GP     Cephus Shelling 10/27/2011, 12:08 PM  10/27/2011 Cephus Shelling, PT, DPT [phone]

## 2011-10-27 NOTE — Progress Notes (Signed)
    Subjective: Procedure(s) (LRB): POSTERIOR LUMBAR FUSION 1 LEVEL (N/A) 3 Days Post-Op  Patient reports pain as 6 on 0-10 scale.  Reports muscle spasm in the left thigh reports incisional back pain   Positive void Negative bowel movement Positive flatus Negative chest pain or shortness of breath  Objective: Vital signs in last 24 hours: Temp:  [98.3 F (36.8 C)-99.2 F (37.3 C)] 99.2 F (37.3 C) (06/30 0600) Pulse Rate:  [75-101] 75  (06/30 0600) Resp:  [18] 18  (06/30 0600) BP: (128-141)/(40-59) 128/58 mmHg (06/30 0600) SpO2:  [96 %-98 %] 98 % (06/30 0600)  Intake/Output from previous day: 06/29 0701 - 06/30 0700 In: 1800 [P.O.:1800] Out: 30 [Drains:30]  No results found for this basename: WBC:2,RBC:2,HCT:2,PLT:2 in the last 72 hours No results found for this basename: NA:2,K:2,CL:2,CO2:2,BUN:2,CREATININE:2,GLUCOSE:2,CALCIUM:2 in the last 72 hours No results found for this basename: LABPT:2,INR:2 in the last 72 hours  Neurologically intact ABD soft Neurovascular intact Incision: dressing C/D/I Compartment soft  Assessment/Plan: Patient stable  xrays satisfactory Continue mobilization with physical therapy Continue care  Discharge home with home health If pt doing well and her pain is controled she can go home later today Gwinda Maine 10/27/2011, 9:27 AM

## 2011-10-27 NOTE — Progress Notes (Signed)
Kimberly Gable PA notified that patient requested to stay extra night due to muscle spasms in left hip.  Valium 5mg  PO ordered for patient relief.   Alasdair Kleve N

## 2011-10-28 MED ORDER — ONDANSETRON HCL 4 MG PO TABS: 4.0000 mg | ORAL_TABLET | Freq: Three times a day (TID) | ORAL | Status: AC | PRN

## 2011-10-28 MED ORDER — DIAZEPAM 5 MG PO TABS: 5.0000 mg | ORAL_TABLET | Freq: Three times a day (TID) | ORAL | Status: AC | PRN

## 2011-10-28 MED ORDER — OXYCODONE-ACETAMINOPHEN 10-325 MG PO TABS: 1.0000 | ORAL_TABLET | Freq: Four times a day (QID) | ORAL | Status: AC | PRN

## 2011-10-28 MED ORDER — POLYETHYLENE GLYCOL 3350 17 G PO PACK: 17.0000 g | PACK | Freq: Every day | ORAL | Status: AC

## 2011-10-28 MED FILL — Sodium Chloride IV Soln 0.9%: INTRAVENOUS | Qty: 1000 | Status: AC

## 2011-10-28 MED FILL — Heparin Sodium (Porcine) Inj 1000 Unit/ML: INTRAMUSCULAR | Qty: 30 | Status: AC

## 2011-10-28 NOTE — Progress Notes (Signed)
Physical Therapy Treatment Patient Details Name: Kimberly Larsen MRN: [id number] DOB: [birth year] Today's Date: 10/28/2011 Time: 0821-0830 PT Time Calculation (min): 9 min  PT Assessment / Plan / Recommendation Comments on Treatment Session  Pt admitted s/p L3-4 fusion and is ready for safe d/c home once medically cleared by MD.  Pt with increased activity tolerance today as well as independence as mobility.    Follow Up Recommendations  Home health PT    Barriers to Discharge        Equipment Recommendations  None recommended by PT;3 in 1 bedside comode    Recommendations for Other Services    Frequency Min 5X/week   Plan Discharge plan remains appropriate;Frequency remains appropriate    Precautions / Restrictions Precautions Precautions: Back Precaution Booklet Issued: No Precaution Comments: Pt able to recall 3/3 back precautions. Required Braces or Orthoses: Spinal Brace Spinal Brace: Lumbar corset;Applied in sitting position Restrictions Weight Bearing Restrictions: No   Pertinent Vitals/Pain 5/10 in back.  Pt repositioned.    Mobility  Bed Mobility Bed Mobility: Not assessed Transfers Transfers: Sit to Stand;Stand to Sit Sit to Stand: 6: Modified independent (Device/Increase time) Stand to Sit: 6: Modified independent (Device/Increase time) Ambulation/Gait Ambulation/Gait Assistance: 5: Supervision Ambulation Distance (Feet): 450 Feet Assistive device: None Ambulation/Gait Assistance Details: Verbal cues for tall posture. Gait Pattern: Step-through pattern;Decreased stride length Stairs: No Wheelchair Mobility Wheelchair Mobility: No    Exercises     PT Diagnosis:    PT Problem List:   PT Treatment Interventions:     PT Goals Acute Rehab PT Goals PT Goal Formulation: With patient Time For Goal Achievement: 11/01/11 Potential to Achieve Goals: Good PT Goal: Sit to Stand - Progress: Met PT Goal: Stand to Sit - Progress: Met PT Goal: Ambulate - Progress:  Progressing toward goal Additional Goals PT Goal: Additional Goal #1 - Progress: Met  Visit Information  Last PT Received On: 10/28/11 Assistance Needed: +1    Subjective Data  Subjective: "I am going home today!" Patient Stated Goal: Home   Cognition  Overall Cognitive Status: Appears within functional limits for tasks assessed/performed Arousal/Alertness: Awake/alert Orientation Level: Appears intact for tasks assessed Behavior During Session: [facility] for tasks performed    Balance  Balance Balance Assessed: No  End of Session PT - End of Session Equipment Utilized During Treatment: Gait belt;Back brace Activity Tolerance: Patient tolerated treatment well Patient left: in chair;with call bell/phone within reach;with family/visitor present Nurse Communication: Mobility status   GP     Cephus Shelling 10/28/2011, 11:01 AM  10/28/2011 Cephus Shelling, PT, DPT [phone]

## 2011-10-28 NOTE — Progress Notes (Signed)
    Subjective: Procedure(s) (LRB): POSTERIOR LUMBAR FUSION 1 LEVEL (N/A) 4 Days Post-Op  Patient reports pain as 3 on 0-10 scale.  Reports decreased leg pain denies incisional back pain   Positive void Positive bowel movement Positive flatus Negative chest pain or shortness of breath  Objective: Vital signs in last 24 hours: Temp:  [98 F (36.7 C)-98.1 F (36.7 C)] 98.1 F (36.7 C) (07/01 0513) Pulse Rate:  [78-93] 78  (07/01 0513) Resp:  [16-18] 16  (07/01 0513) BP: (135-167)/(54-72) 167/72 mmHg (07/01 0513) SpO2:  [99 %-100 %] 100 % (07/01 0513)  Intake/Output from previous day:    Labs: No results found for this basename: WBC:2,RBC:2,HCT:2,PLT:2 in the last 72 hours No results found for this basename: NA:2,K:2,CL:2,CO2:2,BUN:2,CREATININE:2,GLUCOSE:2,CALCIUM:2 in the last 72 hours No results found for this basename: LABPT:2,INR:2 in the last 72 hours  Physical Exam: Neurologically intact Neurovascular intact Sensation intact distally Incision: dressing C/D/I and no drainage Compartment soft  Assessment/Plan: Patient stable  xrays satisfactory Continue mobilization with physical therapy Continue care  Discharge home with home health Valium for muscle spasm, oxycodone for pain F/u 10-14 days for wound check  Venita Lick, MD [facility] [phone]

## 2011-10-28 NOTE — Progress Notes (Signed)
CARE MANAGEMENT NOTE 10/28/2011  Patient:  STACEE, EARP   Account Number:  [id number]  Date Initiated:  10/25/2011  Documentation initiated by:  Vance Peper Subjective/Objective Assessment:   66 yr old female s/p L3-L5 decompression, posterior lateral arthrodesis L3-4, complete diskectomy.     Anticipated DC Date:  10/28/2011   Anticipated DC Plan:  HOME W HOME HEALTH SERVICES      DC Planning Services  CM consult      [facility] Choice  HOME HEALTH  DURABLE MEDICAL EQUIPMENT   Choice offered to / List presented to:  C-1 Patient   DME arranged  3-N-1      DME agency  [facility]     HH arranged  HH-2 PT  HH-3 OT      [facility] agency  [facility]   Status of service:  Completed, signed off Medicare Important Message given?   (If response is "NO", the following Medicare IM given date fields will be blank) Date Medicare IM given:   Date Additional Medicare IM given:    Discharge Disposition:  HOME W HOME HEALTH SERVICES  Per UR Regulation:    If discussed at Long Length of Stay Meetings, dates discussed:    Comments:  10/28/11 0900 Vance Peper, RN BSN Case Manager [phone] Case Manager spoke eith patient regarding home health and dme needs. Choice offered.

## 2011-10-28 NOTE — Discharge Instructions (Signed)
Home Health Physical therapy to be provided by [facility] [phone]

## 2012-08-19 ENCOUNTER — Encounter (HOSPITAL_COMMUNITY): Payer: Self-pay | Admitting: Pharmacy Technician

## 2012-08-26 ENCOUNTER — Encounter (HOSPITAL_COMMUNITY)
Admission: RE | Admit: 2012-08-26 | Discharge: 2012-08-26 | Disposition: A | Discharge status: Home / Self Care | Payer: BC Managed Care – PPO | Source: Ambulatory Visit | Attending: Orthopedic Surgery | Admitting: Orthopedic Surgery

## 2012-08-26 ENCOUNTER — Encounter (HOSPITAL_COMMUNITY): Payer: Self-pay

## 2012-08-26 DIAGNOSIS — Z01812 Encounter for preprocedural laboratory examination: Secondary | ICD-10-CM | POA: Insufficient documentation

## 2012-08-26 DIAGNOSIS — M1710 Unilateral primary osteoarthritis, unspecified knee: Secondary | ICD-10-CM | POA: Insufficient documentation

## 2012-08-26 DIAGNOSIS — Z0183 Encounter for blood typing: Secondary | ICD-10-CM | POA: Insufficient documentation

## 2012-08-26 DIAGNOSIS — Z01810 Encounter for preprocedural cardiovascular examination: Secondary | ICD-10-CM | POA: Insufficient documentation

## 2012-08-26 HISTORY — DX: Personal history of urinary calculi: Z87.442

## 2012-08-26 LAB — ABO/RH: ABO/RH(D): O POS

## 2012-08-26 LAB — CBC
HCT: 39 % (ref 36.0–46.0)
Hemoglobin: 13.3 g/dL (ref 12.0–15.0)
MCH: 30.8 pg (ref 26.0–34.0)
MCHC: 34.1 g/dL (ref 30.0–36.0)
MCV: 90.3 fL (ref 78.0–100.0)
Platelets: 280 10*3/uL (ref 150–400)
RBC: 4.32 MIL/uL (ref 3.87–5.11)
RDW: 13.7 % (ref 11.5–15.5)
WBC: 7.9 10*3/uL (ref 4.0–10.5)

## 2012-08-26 LAB — BASIC METABOLIC PANEL
BUN: 16 mg/dL (ref 6–23)
CO2: 30 mEq/L (ref 19–32)
Calcium: 9.6 mg/dL (ref 8.4–10.5)
Chloride: 102 mEq/L (ref 96–112)
Creatinine, Ser: 0.67 mg/dL (ref 0.50–1.10)
GFR calc Af Amer: >90 mL/min (ref >90)
GFR calc non Af Amer: >90 mL/min (ref >90)
Glucose, Bld: 93 mg/dL (ref 70–99)
Potassium: 3.7 mEq/L (ref 3.5–5.1)
Sodium: 141 mEq/L (ref 135–145)

## 2012-08-26 LAB — URINALYSIS, ROUTINE W REFLEX MICROSCOPIC
Bilirubin Urine: NEGATIVE
Glucose, UA: NEGATIVE mg/dL
Hgb urine dipstick: NEGATIVE
Ketones, ur: NEGATIVE mg/dL
Leukocytes, UA: NEGATIVE
Nitrite: NEGATIVE
Protein, ur: NEGATIVE mg/dL
Specific Gravity, Urine: 1.022 (ref 1.005–1.030)
Urobilinogen, UA: 0.2 mg/dL (ref 0.0–1.0)
pH: 7 (ref 5.0–8.0)

## 2012-08-26 LAB — PROTIME-INR
INR: 0.94 (ref 0.00–1.49)
Prothrombin Time: 12.5 seconds (ref 11.6–15.2)

## 2012-08-26 LAB — APTT: aPTT: 27 seconds (ref 24–37)

## 2012-08-26 LAB — SURGICAL PCR SCREEN
MRSA, PCR: NEGATIVE
Staphylococcus aureus: NEGATIVE

## 2012-08-26 NOTE — Patient Instructions (Signed)
Kimberly Larsen  08/26/2012                           YOUR PROCEDURE IS SCHEDULED ON: 08/31/12               PLEASE REPORT TO SHORT STAY CENTER AT :  7:30 AM               CALL THIS NUMBER IF ANY PROBLEMS THE DAY OF SURGERY :               [phone]                      REMEMBER:   Do not eat food or drink liquids AFTER MIDNIGHT   Take these medicines the morning of surgery with A SIP OF WATER: CYMBALTA / MAY TAKE XANAX OR OXYCODONE IF NEEDED   Do not wear jewelry, make-up   Do not wear lotions, powders, or perfumes.   Do not shave legs or underarms 12 hrs. before surgery (men may shave face)  Do not bring valuables to the hospital.  Contacts, dentures or bridgework may not be worn into surgery.  Leave suitcase in the car. After surgery it may be brought to your room.  For patients admitted to the hospital more than one night, checkout time is 11:00                          The day of discharge.   Patients discharged the day of surgery will not be allowed to drive home                             If going home same day of surgery, must have someone stay with you first                           24 hrs at home and arrange for some one to drive you home from hospital.    Special Instructions:   Please read over the following fact sheets that you were given:               1. MRSA  INFORMATION                      2. [place] PREPARING FOR SURGERY SHEET               3. INCENTIVE SPIROMETER                                                X_____________________________________________________________________        Failure to follow these instructions may result in cancellation of your surgery

## 2012-08-26 NOTE — Progress Notes (Signed)
08/26/12 1009  OBSTRUCTIVE SLEEP APNEA  Have you ever been diagnosed with sleep apnea through a sleep study? No  Do you snore loudly (loud enough to be heard through closed doors)?  0  Do you often feel tired, fatigued, or sleepy during the daytime? 1  Has anyone observed you stop breathing during your sleep? 0  Do you have, or are you being treated for high blood pressure? 1  BMI more than 35 kg/m2? 1  Age over 65 years old? 1  Neck circumference greater than 40 cm/18 inches? 0  Gender: 0  Obstructive Sleep Apnea Score 4  Score 4 or greater  Results sent to PCP

## 2012-08-30 NOTE — H&P (Signed)
TOTAL KNEE ADMISSION H&P  Patient is being admitted for right total knee arthroplasty.  Subjective:  Chief Complaint:    Right knee OA / pain.  HPI: Kimberly Larsen, 67 y.o. female, has a history of pain and functional disability in the right knee due to arthritis and has failed non-surgical conservative treatments for greater than 12 weeks to includeNSAID's and/or analgesics, corticosteriod injections, viscosupplementation injections, supervised PT with diminished ADL's post treatment, use of assistive devices and activity modification.  Onset of symptoms was gradual, starting 2 years ago with rapidlly worsening course since that time. The patient noted no past surgery on the right knee(s).  Patient currently rates pain in the right knee(s) at 8 out of 10 with activity. Patient has worsening of pain with activity and weight bearing, pain that interferes with activities of daily living, pain with passive range of motion, crepitus and joint swelling.  Patient has evidence of periarticular osteophytes and joint space narrowing by imaging studies. There is no active infection.  Risks, benefits and expectations were discussed with the patient. Patient understand the risks, benefits and expectations and wishes to proceed with surgery.   D/C Plans:   SNF([facility])  Post-op Meds:    No Rx given   Tranexamic Acid:   To be given  Decadron:    To be given  FYI:   Nothing to note   Patient Active Problem List   Diagnosis Date Noted  . HYPERTENSION, BENIGN SYSTEMIC 06/26/2006  . NEPHROLITHIASIS 06/26/2006   Past Medical History  Diagnosis Date  . Hypertension   . Arthritis   . HLD (hyperlipidemia)   . History of kidney stones     Past Surgical History  Procedure Laterality Date  . Tubal ligation    . Ganglion cyst excision      L ankle  . Rhinoplasty    . Lithotripsy    . Carpal tunnel release      R hand  . Back surgery  2013    No prescriptions prior to admission   No Known Allergies   History  Substance Use Topics  . Smoking status: Never Smoker   . Smokeless tobacco: Not on file  . Alcohol Use: No    No family history on file.   Review of Systems  Constitutional: Negative.   HENT: Negative.   Respiratory: Negative.   Cardiovascular: Negative.   Gastrointestinal: Negative.   Genitourinary: Negative.   Musculoskeletal: Positive for myalgias and joint pain.  Skin: Negative.   Neurological: Negative.   Endo/Heme/Allergies: Negative.   Psychiatric/Behavioral: Negative.     Objective:  Physical Exam  Constitutional: She is oriented to person, place, and time. She appears well-developed and well-nourished.  HENT:  Head: Normocephalic and atraumatic.  Mouth/Throat: Oropharynx is clear and moist.  Eyes: Pupils are equal, round, and reactive to light.  Neck: Neck supple. No JVD present. No tracheal deviation present. No thyromegaly present.  Cardiovascular: Normal rate, regular rhythm, normal heart sounds and intact distal pulses.   Respiratory: Effort normal and breath sounds normal. No stridor. No respiratory distress. She has no wheezes.  GI: Soft. There is no tenderness. There is no guarding.  Musculoskeletal:       Right knee: She exhibits decreased range of motion, swelling and bony tenderness. She exhibits no effusion, no ecchymosis, no laceration and no erythema. Tenderness found.  Lymphadenopathy:    She has no cervical adenopathy.  Neurological: She is alert and oriented to person, place, and time.  Skin:  Skin is warm and dry.  Psychiatric: She has a normal mood and affect.    Labs:  Estimated body mass index is 35.68 kg/(m^2) as calculated from the following:   Height as of 10/22/11: 5\' 2"  (1.575 m).   Weight as of 10/22/11: 88.5 kg (195 lb 1.7 oz).   Imaging Review Plain radiographs demonstrate severe degenerative joint disease of the right knee(s). The overall alignment isneutral. The bone quality appears to be good for age and reported  activity level.  Assessment/Plan:  End stage arthritis, right knee   The patient history, physical examination, clinical judgment of the provider and imaging studies are consistent with end stage degenerative joint disease of the right knee(s) and total knee arthroplasty is deemed medically necessary. The treatment options including medical management, injection therapy arthroscopy and arthroplasty were discussed at length. The risks and benefits of total knee arthroplasty were presented and reviewed. The risks due to aseptic loosening, infection, stiffness, patella tracking problems, thromboembolic complications and other imponderables were discussed. The patient acknowledged the explanation, agreed to proceed with the plan and consent was signed. Patient is being admitted for inpatient treatment for surgery, pain control, PT, OT, prophylactic antibiotics, VTE prophylaxis, progressive ambulation and ADL's and discharge planning. The patient is planning to be discharged to skilled nursing facility.   Anastasio Auerbach Sunya Humbarger   PAC  08/30/2012, 10:23 PM

## 2012-08-31 ENCOUNTER — Encounter (HOSPITAL_COMMUNITY): Payer: Self-pay | Admitting: *Deleted

## 2012-08-31 ENCOUNTER — Inpatient Hospital Stay (HOSPITAL_COMMUNITY)
Admission: RE | Admit: 2012-08-31 | Discharge: 2012-09-04 | DRG: 209 | Disposition: A | Discharge status: Skilled Nursing Facility | Payer: BC Managed Care – PPO | Source: Ambulatory Visit | Attending: Orthopedic Surgery | Admitting: Orthopedic Surgery

## 2012-08-31 ENCOUNTER — Encounter (HOSPITAL_COMMUNITY)
Admission: RE | Disposition: A | Discharge status: Skilled Nursing Facility | Payer: Self-pay | Source: Ambulatory Visit | Attending: Orthopedic Surgery

## 2012-08-31 ENCOUNTER — Encounter (HOSPITAL_COMMUNITY): Payer: Self-pay | Admitting: Certified Registered Nurse Anesthetist

## 2012-08-31 ENCOUNTER — Ambulatory Visit (HOSPITAL_COMMUNITY): Payer: BC Managed Care – PPO | Admitting: Certified Registered Nurse Anesthetist

## 2012-08-31 DIAGNOSIS — Z01810 Encounter for preprocedural cardiovascular examination: Secondary | ICD-10-CM

## 2012-08-31 DIAGNOSIS — D500 Iron deficiency anemia secondary to blood loss (chronic): Secondary | ICD-10-CM | POA: Diagnosis not present

## 2012-08-31 DIAGNOSIS — M1710 Unilateral primary osteoarthritis, unspecified knee: Principal | ICD-10-CM | POA: Diagnosis present

## 2012-08-31 DIAGNOSIS — M898X9 Other specified disorders of bone, unspecified site: Secondary | ICD-10-CM | POA: Diagnosis present

## 2012-08-31 DIAGNOSIS — Z96651 Presence of right artificial knee joint: Secondary | ICD-10-CM

## 2012-08-31 DIAGNOSIS — Z87442 Personal history of urinary calculi: Secondary | ICD-10-CM

## 2012-08-31 DIAGNOSIS — Z01812 Encounter for preprocedural laboratory examination: Secondary | ICD-10-CM

## 2012-08-31 DIAGNOSIS — E785 Hyperlipidemia, unspecified: Secondary | ICD-10-CM | POA: Diagnosis present

## 2012-08-31 DIAGNOSIS — Z6835 Body mass index (BMI) 35.0-35.9, adult: Secondary | ICD-10-CM

## 2012-08-31 DIAGNOSIS — Z96659 Presence of unspecified artificial knee joint: Secondary | ICD-10-CM

## 2012-08-31 DIAGNOSIS — D62 Acute posthemorrhagic anemia: Secondary | ICD-10-CM | POA: Diagnosis not present

## 2012-08-31 DIAGNOSIS — I10 Essential (primary) hypertension: Secondary | ICD-10-CM | POA: Diagnosis present

## 2012-08-31 DIAGNOSIS — E669 Obesity, unspecified: Secondary | ICD-10-CM | POA: Diagnosis present

## 2012-08-31 HISTORY — PX: TOTAL KNEE ARTHROPLASTY: SHX125

## 2012-08-31 HISTORY — DX: Presence of unspecified artificial knee joint: Z96.659

## 2012-08-31 LAB — TYPE AND SCREEN
ABO/RH(D): O POS
Antibody Screen: NEGATIVE

## 2012-08-31 SURGERY — ARTHROPLASTY, KNEE, TOTAL
Anesthesia: General | Site: Knee | Laterality: Right | Wound class: Clean

## 2012-08-31 MED ORDER — SODIUM CHLORIDE 0.9 % IJ SOLN
INTRAMUSCULAR | Status: DC | PRN
  Administered 2012-08-31: 9 mL via INTRAVENOUS

## 2012-08-31 MED ORDER — PROMETHAZINE HCL 25 MG/ML IJ SOLN: 6.2500 mg | INTRAMUSCULAR | Status: DC | PRN

## 2012-08-31 MED ORDER — HYDROMORPHONE HCL PF 1 MG/ML IJ SOLN
0.2500 mg | INTRAMUSCULAR | Status: DC | PRN
  Administered 2012-08-31: 0.5 mg via INTRAVENOUS

## 2012-08-31 MED ORDER — SIMVASTATIN 20 MG PO TABS
20.0000 mg | ORAL_TABLET | Freq: Every day | ORAL | Status: DC
  Administered 2012-08-31 – 2012-09-03 (×4): 20 mg via ORAL
  Filled 2012-08-31 (×5): qty 1

## 2012-08-31 MED ORDER — RIVAROXABAN 10 MG PO TABS
10.0000 mg | ORAL_TABLET | ORAL | Status: DC
  Administered 2012-09-01 – 2012-09-04 (×4): 10 mg via ORAL
  Filled 2012-08-31 (×5): qty 1

## 2012-08-31 MED ORDER — BUPIVACAINE IN DEXTROSE 0.75-8.25 % IT SOLN
INTRATHECAL | Status: DC | PRN
  Administered 2012-08-31: 2 mL via INTRATHECAL

## 2012-08-31 MED ORDER — ZOLPIDEM TARTRATE 5 MG PO TABS
5.0000 mg | ORAL_TABLET | Freq: Every evening | ORAL | Status: DC | PRN
  Administered 2012-08-31 – 2012-09-03 (×4): 5 mg via ORAL
  Filled 2012-08-31 (×4): qty 1

## 2012-08-31 MED ORDER — MENTHOL 3 MG MT LOZG
1.0000 | LOZENGE | OROMUCOSAL | Status: DC | PRN
  Filled 2012-08-31: qty 9

## 2012-08-31 MED ORDER — LACTATED RINGERS IV SOLN
INTRAVENOUS | Status: DC
  Administered 2012-08-31: 1000 mL via INTRAVENOUS

## 2012-08-31 MED ORDER — DULOXETINE HCL 60 MG PO CPEP
60.0000 mg | ORAL_CAPSULE | Freq: Every day | ORAL | Status: DC
  Administered 2012-09-01 – 2012-09-04 (×4): 60 mg via ORAL
  Filled 2012-08-31 (×4): qty 1

## 2012-08-31 MED ORDER — FENTANYL CITRATE 0.05 MG/ML IJ SOLN
INTRAMUSCULAR | Status: DC | PRN
  Administered 2012-08-31 (×5): 50 ug via INTRAVENOUS

## 2012-08-31 MED ORDER — BUPIVACAINE-EPINEPHRINE 0.25% -1:200000 IJ SOLN
INTRAMUSCULAR | Status: DC | PRN
  Administered 2012-08-31: 30 mL

## 2012-08-31 MED ORDER — CEFAZOLIN SODIUM-DEXTROSE 2-3 GM-% IV SOLR
2.0000 g | Freq: Four times a day (QID) | INTRAVENOUS | Status: AC
  Administered 2012-08-31 (×2): 2 g via INTRAVENOUS
  Filled 2012-08-31 (×2): qty 50

## 2012-08-31 MED ORDER — ONDANSETRON HCL 4 MG PO TABS: 4.0000 mg | ORAL_TABLET | Freq: Four times a day (QID) | ORAL | Status: DC | PRN

## 2012-08-31 MED ORDER — TRANEXAMIC ACID 100 MG/ML IV SOLN
1000.0000 mg | Freq: Once | INTRAVENOUS | Status: AC
  Administered 2012-08-31: 1000 mg via INTRAVENOUS
  Filled 2012-08-31: qty 10

## 2012-08-31 MED ORDER — METOCLOPRAMIDE HCL 10 MG PO TABS: 5.0000 mg | ORAL_TABLET | Freq: Three times a day (TID) | ORAL | Status: DC | PRN

## 2012-08-31 MED ORDER — DOCUSATE SODIUM 100 MG PO CAPS
100.0000 mg | ORAL_CAPSULE | Freq: Two times a day (BID) | ORAL | Status: DC
  Administered 2012-08-31 – 2012-09-04 (×9): 100 mg via ORAL

## 2012-08-31 MED ORDER — METOCLOPRAMIDE HCL 5 MG/ML IJ SOLN: 5.0000 mg | Freq: Three times a day (TID) | INTRAMUSCULAR | Status: DC | PRN

## 2012-08-31 MED ORDER — POLYETHYLENE GLYCOL 3350 17 G PO PACK
17.0000 g | PACK | Freq: Two times a day (BID) | ORAL | Status: DC
  Administered 2012-08-31 – 2012-09-03 (×6): 17 g via ORAL

## 2012-08-31 MED ORDER — PHENOL 1.4 % MT LIQD: 1.0000 | OROMUCOSAL | Status: DC | PRN

## 2012-08-31 MED ORDER — KETOROLAC TROMETHAMINE 30 MG/ML IJ SOLN
INTRAMUSCULAR | Status: AC
  Filled 2012-08-31: qty 1

## 2012-08-31 MED ORDER — FLEET ENEMA 7-19 GM/118ML RE ENEM: 1.0000 | ENEMA | Freq: Once | RECTAL | Status: AC | PRN

## 2012-08-31 MED ORDER — HYDROMORPHONE HCL PF 1 MG/ML IJ SOLN
0.5000 mg | INTRAMUSCULAR | Status: DC | PRN
  Filled 2012-08-31: qty 1

## 2012-08-31 MED ORDER — ALUM & MAG HYDROXIDE-SIMETH 200-200-20 MG/5ML PO SUSP: 30.0000 mL | ORAL | Status: DC | PRN

## 2012-08-31 MED ORDER — ONDANSETRON HCL 4 MG/2ML IJ SOLN
4.0000 mg | Freq: Four times a day (QID) | INTRAMUSCULAR | Status: DC | PRN
  Administered 2012-09-02: 4 mg via INTRAVENOUS
  Filled 2012-08-31: qty 2

## 2012-08-31 MED ORDER — TRAZODONE 25 MG HALF TABLET
25.0000 mg | ORAL_TABLET | Freq: Every day | ORAL | Status: DC
  Administered 2012-09-03: 50 mg via ORAL
  Filled 2012-08-31 (×5): qty 2

## 2012-08-31 MED ORDER — SODIUM CHLORIDE 0.9 % IR SOLN
Status: DC | PRN
  Administered 2012-08-31: 1000 mL

## 2012-08-31 MED ORDER — DEXAMETHASONE SODIUM PHOSPHATE 10 MG/ML IJ SOLN
10.0000 mg | Freq: Once | INTRAMUSCULAR | Status: AC
  Administered 2012-08-31: 10 mg via INTRAVENOUS

## 2012-08-31 MED ORDER — BISACODYL 10 MG RE SUPP: 10.0000 mg | Freq: Every day | RECTAL | Status: DC | PRN

## 2012-08-31 MED ORDER — MEPERIDINE HCL 50 MG/ML IJ SOLN: 6.2500 mg | INTRAMUSCULAR | Status: DC | PRN

## 2012-08-31 MED ORDER — SODIUM CHLORIDE 0.9 % IV SOLN
INTRAVENOUS | Status: DC
  Administered 2012-08-31 – 2012-09-01 (×2): via INTRAVENOUS
  Filled 2012-08-31 (×16): qty 1000

## 2012-08-31 MED ORDER — ALPRAZOLAM 0.25 MG PO TABS
0.2500 mg | ORAL_TABLET | Freq: Two times a day (BID) | ORAL | Status: DC | PRN
  Administered 2012-09-01 – 2012-09-04 (×5): 0.25 mg via ORAL
  Filled 2012-08-31 (×5): qty 1

## 2012-08-31 MED ORDER — METHOCARBAMOL 100 MG/ML IJ SOLN: 500.0000 mg | Freq: Four times a day (QID) | INTRAVENOUS | Status: DC | PRN

## 2012-08-31 MED ORDER — ACETAMINOPHEN 10 MG/ML IV SOLN
INTRAVENOUS | Status: AC
  Filled 2012-08-31: qty 100

## 2012-08-31 MED ORDER — BUPIVACAINE-EPINEPHRINE PF 0.25-1:200000 % IJ SOLN
INTRAMUSCULAR | Status: AC
  Filled 2012-08-31: qty 30

## 2012-08-31 MED ORDER — KETOROLAC TROMETHAMINE 30 MG/ML IJ SOLN
INTRAMUSCULAR | Status: DC | PRN
  Administered 2012-08-31: 30 mg via INTRAMUSCULAR

## 2012-08-31 MED ORDER — ACETAMINOPHEN 10 MG/ML IV SOLN: 1000.0000 mg | Freq: Once | INTRAVENOUS | Status: DC | PRN

## 2012-08-31 MED ORDER — OXYCODONE HCL 5 MG PO TABS
5.0000 mg | ORAL_TABLET | ORAL | Status: DC
  Administered 2012-08-31 – 2012-09-01 (×3): 10 mg via ORAL
  Administered 2012-09-01: 15 mg via ORAL
  Administered 2012-09-01 – 2012-09-03 (×10): 10 mg via ORAL
  Administered 2012-09-03: 15 mg via ORAL
  Administered 2012-09-03 – 2012-09-04 (×4): 10 mg via ORAL
  Administered 2012-09-04: 15 mg via ORAL
  Filled 2012-08-31 (×4): qty 2
  Filled 2012-08-31: qty 3
  Filled 2012-08-31 (×2): qty 2
  Filled 2012-08-31: qty 3
  Filled 2012-08-31: qty 2
  Filled 2012-08-31: qty 3
  Filled 2012-08-31 (×10): qty 2

## 2012-08-31 MED ORDER — HYDROMORPHONE HCL PF 1 MG/ML IJ SOLN
INTRAMUSCULAR | Status: AC
  Filled 2012-08-31: qty 2

## 2012-08-31 MED ORDER — METHOCARBAMOL 500 MG PO TABS
500.0000 mg | ORAL_TABLET | Freq: Four times a day (QID) | ORAL | Status: DC | PRN
  Administered 2012-08-31 – 2012-09-04 (×10): 500 mg via ORAL
  Filled 2012-08-31 (×9): qty 1

## 2012-08-31 MED ORDER — CHLORHEXIDINE GLUCONATE 4 % EX LIQD
60.0000 mL | Freq: Once | CUTANEOUS | Status: DC
  Filled 2012-08-31: qty 60

## 2012-08-31 MED ORDER — PROPOFOL 10 MG/ML IV BOLUS
INTRAVENOUS | Status: DC | PRN
  Administered 2012-08-31: 70 mg via INTRAVENOUS

## 2012-08-31 MED ORDER — ACETAMINOPHEN 10 MG/ML IV SOLN
INTRAVENOUS | Status: DC | PRN
  Administered 2012-08-31: 1000 mg via INTRAVENOUS

## 2012-08-31 MED ORDER — BUPIVACAINE LIPOSOME 1.3 % IJ SUSP
20.0000 mL | Freq: Once | INTRAMUSCULAR | Status: DC
  Filled 2012-08-31: qty 20

## 2012-08-31 MED ORDER — OXYCODONE HCL 5 MG PO TABS: 5.0000 mg | ORAL_TABLET | Freq: Once | ORAL | Status: DC | PRN

## 2012-08-31 MED ORDER — FERROUS SULFATE 325 (65 FE) MG PO TABS
325.0000 mg | ORAL_TABLET | Freq: Three times a day (TID) | ORAL | Status: DC
  Administered 2012-08-31 – 2012-09-04 (×11): 325 mg via ORAL
  Filled 2012-08-31 (×14): qty 1

## 2012-08-31 MED ORDER — CEFAZOLIN SODIUM-DEXTROSE 2-3 GM-% IV SOLR
2.0000 g | INTRAVENOUS | Status: AC
  Administered 2012-08-31: 2 g via INTRAVENOUS

## 2012-08-31 MED ORDER — ONDANSETRON HCL 4 MG/2ML IJ SOLN
INTRAMUSCULAR | Status: DC | PRN
  Administered 2012-08-31: 4 mg via INTRAVENOUS

## 2012-08-31 MED ORDER — MIDAZOLAM HCL 5 MG/5ML IJ SOLN
INTRAMUSCULAR | Status: DC | PRN
  Administered 2012-08-31: 2 mg via INTRAVENOUS

## 2012-08-31 MED ORDER — CELECOXIB 200 MG PO CAPS
200.0000 mg | ORAL_CAPSULE | Freq: Two times a day (BID) | ORAL | Status: DC
  Administered 2012-08-31 – 2012-09-04 (×9): 200 mg via ORAL
  Filled 2012-08-31 (×10): qty 1

## 2012-08-31 MED ORDER — DEXAMETHASONE SODIUM PHOSPHATE 10 MG/ML IJ SOLN
10.0000 mg | Freq: Once | INTRAMUSCULAR | Status: DC
  Filled 2012-08-31: qty 1

## 2012-08-31 MED ORDER — PROPOFOL INFUSION 10 MG/ML OPTIME
INTRAVENOUS | Status: DC | PRN
  Administered 2012-08-31: 100 ug/kg/min via INTRAVENOUS

## 2012-08-31 MED ORDER — DIPHENHYDRAMINE HCL 25 MG PO CAPS: 25.0000 mg | ORAL_CAPSULE | Freq: Four times a day (QID) | ORAL | Status: DC | PRN

## 2012-08-31 MED ORDER — CEFAZOLIN SODIUM-DEXTROSE 2-3 GM-% IV SOLR
INTRAVENOUS | Status: AC
  Filled 2012-08-31: qty 50

## 2012-08-31 MED ORDER — OXYCODONE HCL 5 MG/5ML PO SOLN
5.0000 mg | Freq: Once | ORAL | Status: DC | PRN
  Filled 2012-08-31: qty 5

## 2012-08-31 SURGICAL SUPPLY — 57 items
ADH SKN CLS APL DERMABOND .7 (GAUZE/BANDAGES/DRESSINGS) ×1
BAG SPEC THK2 15X12 ZIP CLS (MISCELLANEOUS) ×1
BAG ZIPLOCK 12X15 (MISCELLANEOUS) ×2 IMPLANT
BANDAGE ELASTIC 6 VELCRO ST LF (GAUZE/BANDAGES/DRESSINGS) ×2 IMPLANT
BANDAGE ESMARK 6X9 LF (GAUZE/BANDAGES/DRESSINGS) ×1 IMPLANT
BLADE SAW SGTL 13.0X1.19X90.0M (BLADE) ×2 IMPLANT
BNDG CMPR 9X6 STRL LF SNTH (GAUZE/BANDAGES/DRESSINGS) ×1
BNDG ESMARK 6X9 LF (GAUZE/BANDAGES/DRESSINGS) ×2
BOWL SMART MIX CTS (DISPOSABLE) ×2 IMPLANT
CEMENT HV SMART SET (Cement) ×2 IMPLANT
CLOTH BEACON ORANGE TIMEOUT ST (SAFETY) ×2 IMPLANT
CUFF TOURN SGL QUICK 34 (TOURNIQUET CUFF) ×2
CUFF TRNQT CYL 34X4X40X1 (TOURNIQUET CUFF) ×1 IMPLANT
DECANTER SPIKE VIAL GLASS SM (MISCELLANEOUS) ×3 IMPLANT
DERMABOND ADVANCED (GAUZE/BANDAGES/DRESSINGS) ×1
DERMABOND ADVANCED .7 DNX12 (GAUZE/BANDAGES/DRESSINGS) ×1 IMPLANT
DRAPE EXTREMITY T 121X128X90 (DRAPE) ×2 IMPLANT
DRAPE POUCH INSTRU U-SHP 10X18 (DRAPES) ×2 IMPLANT
DRAPE U-SHAPE 47X51 STRL (DRAPES) ×2 IMPLANT
DRSG AQUACEL AG ADV 3.5X10 (GAUZE/BANDAGES/DRESSINGS) ×2 IMPLANT
DRSG TEGADERM 4X4.75 (GAUZE/BANDAGES/DRESSINGS) ×2 IMPLANT
DURAPREP 26ML APPLICATOR (WOUND CARE) ×2 IMPLANT
ELECT REM PT RETURN 9FT ADLT (ELECTROSURGICAL) ×2
ELECTRODE REM PT RTRN 9FT ADLT (ELECTROSURGICAL) ×1 IMPLANT
EVACUATOR 1/8 PVC DRAIN (DRAIN) ×2 IMPLANT
FACESHIELD LNG OPTICON STERILE (SAFETY) ×10 IMPLANT
GAUZE SPONGE 2X2 8PLY STRL LF (GAUZE/BANDAGES/DRESSINGS) ×1 IMPLANT
GLOVE BIOGEL PI IND STRL 7.5 (GLOVE) ×1 IMPLANT
GLOVE BIOGEL PI IND STRL 8 (GLOVE) ×1 IMPLANT
GLOVE BIOGEL PI INDICATOR 7.5 (GLOVE) ×1
GLOVE BIOGEL PI INDICATOR 8 (GLOVE) ×1
GLOVE ECLIPSE 8.0 STRL XLNG CF (GLOVE) ×2 IMPLANT
GLOVE ORTHO TXT STRL SZ7.5 (GLOVE) ×4 IMPLANT
GOWN BRE IMP PREV XXLGXLNG (GOWN DISPOSABLE) ×4 IMPLANT
GOWN STRL NON-REIN LRG LVL3 (GOWN DISPOSABLE) ×2 IMPLANT
HANDPIECE INTERPULSE COAX TIP (DISPOSABLE) ×2
KIT BASIN OR (CUSTOM PROCEDURE TRAY) ×2 IMPLANT
MANIFOLD NEPTUNE II (INSTRUMENTS) ×2 IMPLANT
NDL SAFETY ECLIPSE 18X1.5 (NEEDLE) ×1 IMPLANT
NEEDLE HYPO 18GX1.5 SHARP (NEEDLE) ×2
NS IRRIG 1000ML POUR BTL (IV SOLUTION) ×4 IMPLANT
PACK TOTAL JOINT (CUSTOM PROCEDURE TRAY) ×2 IMPLANT
POSITIONER SURGICAL ARM (MISCELLANEOUS) ×2 IMPLANT
SET HNDPC FAN SPRY TIP SCT (DISPOSABLE) ×1 IMPLANT
SET PAD KNEE POSITIONER (MISCELLANEOUS) ×2 IMPLANT
SPONGE GAUZE 2X2 STER 10/PKG (GAUZE/BANDAGES/DRESSINGS) ×1
SUCTION FRAZIER 12FR DISP (SUCTIONS) ×2 IMPLANT
SUT MNCRL AB 4-0 PS2 18 (SUTURE) ×2 IMPLANT
SUT VIC AB 1 CT1 36 (SUTURE) ×2 IMPLANT
SUT VIC AB 2-0 CT1 27 (SUTURE) ×6
SUT VIC AB 2-0 CT1 TAPERPNT 27 (SUTURE) ×3 IMPLANT
SUT VLOC 180 0 24IN GS25 (SUTURE) ×2 IMPLANT
SYR 50ML LL SCALE MARK (SYRINGE) ×2 IMPLANT
TOWEL OR 17X26 10 PK STRL BLUE (TOWEL DISPOSABLE) ×4 IMPLANT
TRAY FOLEY CATH 14FRSI W/METER (CATHETERS) ×2 IMPLANT
WATER STERILE IRR 1500ML POUR (IV SOLUTION) ×2 IMPLANT
WRAP KNEE MAXI GEL POST OP (GAUZE/BANDAGES/DRESSINGS) ×2 IMPLANT

## 2012-08-31 NOTE — Anesthesia Postprocedure Evaluation (Signed)
Anesthesia Post Note  Patient: Kimberly Larsen  Procedure(s) Performed: Procedure(s) (LRB): RIGHT TOTAL KNEE ARTHROPLASTY (Right)  Anesthesia type: General  Patient location: PACU  Post pain: Pain level controlled  Post assessment: Post-op Vital signs reviewed  Last Vitals: BP 148/82  Pulse 94  Temp(Src) 36.6 C (Oral)  Resp 14  SpO2 97%  Post vital signs: Reviewed  Level of consciousness: sedated  Complications: No apparent anesthesia complications

## 2012-08-31 NOTE — Evaluation (Addendum)
Physical Therapy Evaluation Patient Details Name: Kimberly Larsen MRN: [id number] DOB: August 20, 1945 Today's Date: 08/31/2012 Time: 6578-4696 PT Time Calculation (min): 24 min  PT Assessment / Plan / Recommendation Clinical Impression  67 yo female s/p R TKA. On POD 0 eval, pt required Min assist for mobility-able to ambulate ~15 feet with RW. Distance limited by pain. Recommend ST rehab at SNF to improve strength, ROM, gait and balance.     PT Assessment  Patient needs continued PT services    Follow Up Recommendations  SNF    Does the patient have the potential to tolerate intense rehabilitation      Barriers to Discharge        Equipment Recommendations       Recommendations for Other Services OT consult   Frequency 7X/week    Precautions / Restrictions Precautions Precautions: Knee;Fall Restrictions Weight Bearing Restrictions: No RLE Weight Bearing: Weight bearing as tolerated   Pertinent Vitals/Pain 6/10 R knee      Mobility  Bed Mobility Bed Mobility: Supine to Sit Supine to Sit: 4: Min assist Details for Bed Mobility Assistance: Assist for R LE off bed Transfers Transfers: Sit to Stand;Stand to Sit Sit to Stand: 4: Min assist;From bed Stand to Sit: 4: Min assist;To chair/3-in-1;With armrests Details for Transfer Assistance: VCs safety, technique, hand placement. Assist to rise, stabilize, control descent Ambulation/Gait Ambulation/Gait Assistance: 4: Min assist Ambulation Distance (Feet): 15 Feet Assistive device: Rolling walker Ambulation/Gait Assistance Details: VCs safety, technique, sequence, step length, distance from RW. assist to maneuver with RW and to stabilize pt throughout ambulation.  Gait Pattern: Step-to pattern;Decreased stride length;Decreased step length - right;Antalgic    Exercises     PT Diagnosis: Difficulty walking;Abnormality of gait;Acute pain  PT Problem List: Decreased strength;Decreased range of motion;Decreased activity  tolerance;Decreased balance;Decreased mobility;Pain;Decreased knowledge of use of DME;Decreased knowledge of precautions PT Treatment Interventions: DME instruction;Gait training;Functional mobility training;Therapeutic activities;Therapeutic exercise;Patient/family education   PT Goals Acute Rehab PT Goals PT Goal Formulation: With patient Time For Goal Achievement: 09/07/12 Potential to Achieve Goals: Good Pt will go Supine/Side to Sit: with supervision PT Goal: Supine/Side to Sit - Progress: Goal set today Pt will go Sit to Supine/Side: with supervision PT Goal: Sit to Supine/Side - Progress: Goal set today Pt will go Sit to Stand: with supervision PT Goal: Sit to Stand - Progress: Goal set today Pt will Ambulate: 51 - 150 feet;with supervision;with rolling walker PT Goal: Ambulate - Progress: Goal set today Pt will Perform Home Exercise Program: with supervision, verbal cues required/provided PT Goal: Perform Home Exercise Program - Progress: Goal set today  Visit Information  Last PT Received On: 08/31/12 Assistance Needed: +1    Subjective Data  Subjective: i feel like i wanna bend this knee Patient Stated Goal: regain independence.    Prior Functioning  Home Living: House 1 level 3 steps Rail-right Shower chair; 3n1 Additional Comments: pt planning to d/c to SNF Prior Function Level of Independence: Independent Communication Communication: No difficulties    Cognition  Cognition Arousal/Alertness: Awake/alert Behavior During Therapy: WFL for tasks assessed/performed Overall Cognitive Status: Within Functional Limits for tasks assessed    Extremity/Trunk Assessment Right Lower Extremity Assessment RLE ROM/Strength/Tone: Deficits;Unable to fully assess;Due to pain RLE ROM/Strength/Tone Deficits: hip flex 2/5, moves ankle well RLE Sensation: Deficits RLE Sensation Deficits: Decreased sensation R lower inside of leg Left Lower Extremity Assessment LLE  ROM/Strength/Tone: Within functional levels Trunk Assessment Trunk Assessment: Normal   Balance    End  of Session PT - End of Session Activity Tolerance: Patient limited by pain Patient left: in chair;with call bell/phone within reach;with family/visitor present  GP     [name], MPT Pager: [phone]

## 2012-08-31 NOTE — Interval H&P Note (Signed)
History and Physical Interval Note:  08/31/2012 9:15 AM  Kimberly Larsen  has presented today for surgery, with the diagnosis of RIGHT KNEE OSTEOARTHRITIS  The various methods of treatment have been discussed with the patient and family. After consideration of risks, benefits and other options for treatment, the patient has consented to  Procedure(s): RIGHT TOTAL KNEE ARTHROPLASTY (Right) as a surgical intervention .  The patient's history has been reviewed, patient examined, no change in status, stable for surgery.  I have reviewed the patient's chart and labs.  Questions were answered to the patient's satisfaction.     [name]

## 2012-08-31 NOTE — Anesthesia Procedure Notes (Signed)
Spinal  Patient location during procedure: OR Start time: 08/31/2012 9:56 AM End time: 08/31/2012 10:10 AM Staffing Anesthesiologist: Lewie Loron R Performed by: anesthesiologist  Preanesthetic Checklist Completed: patient identified, site marked, surgical consent, pre-op evaluation, timeout performed, IV checked, risks and benefits discussed and monitors and equipment checked Spinal Block Patient position: sitting Prep: ChloraPrep Patient monitoring: heart rate, continuous pulse ox and blood pressure Location: L5-S1 Injection technique: single-shot Needle Needle type: Quincke  Needle gauge: 22 G Needle length: 9 cm Additional Notes Expiration date of kit checked and confirmed. Patient tolerated procedure well, without complications.

## 2012-08-31 NOTE — Op Note (Signed)
NAME:  Kimberly Larsen                      MEDICAL RECORD NO.:  [id number]                             FACILITY:  [facility]      PHYSICIAN:  Madlyn Frankel. Charlann Boxer, M.D.  DATE OF BIRTH:  07/15/1945      DATE OF PROCEDURE:  08/31/2012                                     OPERATIVE REPORT         PREOPERATIVE DIAGNOSIS:  Right knee osteoarthritis.      POSTOPERATIVE DIAGNOSIS:  Right knee osteoarthritis.      FINDINGS:  The patient was noted to have complete loss of cartilage and   bone-on-bone arthritis with associated osteophytes in the medial and patellofemoral compartments of   the knee with a significant synovitis and associated effusion.      PROCEDURE:  Right total knee replacement.      COMPONENTS USED:  DePuy rotating platform posterior stabilized knee   system, a size 3 femur, 3 tibia, 12.5 mm PS insert, and 35 patellar   button.      SURGEON:  Madlyn Frankel. Charlann Boxer, M.D.      ASSISTANT:  Lanney Gins, PA-C.      ANESTHESIA:  General and Spinal.      SPECIMENS:  None.      COMPLICATION:  None.      DRAINS:  One Hemovac.  EBL: <100cc      TOURNIQUET TIME:   Total Tourniquet Time Documented: Thigh (Right) - 36 minutes Total: Thigh (Right) - 36 minutes  .      The patient was stable to the recovery room.      INDICATION FOR PROCEDURE:  Kimberly Larsen is a 67 y.o. female patient of   mine.  The patient had been seen, evaluated, and treated conservatively in the   office with medication, activity modification, and injections.  The patient had   radiographic changes of bone-on-bone arthritis with endplate sclerosis and osteophytes noted.      The patient failed conservative measures including medication, injections, and activity modification, and at this point was ready for more definitive measures.   Based on the radiographic changes and failed conservative measures, the patient   decided to proceed with total knee replacement.  Risks of infection,   DVT, component failure, need  for revision surgery, postop course, and   expectations were all   discussed and reviewed.  Consent was obtained for benefit of pain   relief.      PROCEDURE IN DETAIL:  The patient was brought to the operative theater.   Once adequate anesthesia, preoperative antibiotics, 2 gm of Ancef administered, the patient was positioned supine with the right thigh tourniquet placed.  The  right lower extremity was prepped and draped in sterile fashion.  A time-   out was performed identifying the patient, planned procedure, and   extremity.      The right lower extremity was placed in the [facility] leg holder.  The leg was   exsanguinated, tourniquet elevated to 250 mmHg.  A midline incision was   made followed by median parapatellar arthrotomy.  Following initial   exposure,  attention was first directed to the patella.  Precut   measurement was noted to be 23 mm.  I resected down to 14 mm and used a   35 patellar button to restore patellar height as well as cover the cut   surface.      The lug holes were drilled and a metal shim was placed to protect the   patella from retractors and saw blades.      At this point, attention was now directed to the femur.  The femoral   canal was opened with a drill, irrigated to try to prevent fat emboli.  An   intramedullary rod was passed at 3 degrees valgus, 10 mm of bone was   resected off the distal femur.  Following this resection, the tibia was   subluxated anteriorly.  Using the extramedullary guide, 10 mm of bone was resected off   the proximal lateral tibia.  We confirmed the gap would be   stable medially and laterally with a 10 mm insert as well as confirmed   the cut was perpendicular in the coronal plane, checking with an alignment rod.      Once this was done, I sized the femur to be a size 3 in the anterior-   posterior dimension, chose a standard component based on medial and   lateral dimension.  The size 3 rotation block was then pinned in    position anterior referenced using the C-clamp to set rotation.  The   anterior, posterior, and  chamfer cuts were made without difficulty nor   notching making certain that I was along the anterior cortex to help   with flexion gap stability.      The final box cut was made off the lateral aspect of distal femur.      At this point, the tibia was sized to be a size 3, the size 3 tray was   then pinned in position through the medial third of the tubercle,   drilled, and keel punched.  Trial reduction was now carried with a 3 femur,  3 tibia, a 12.5 mm insert, and the 35 patella botton.  The knee was brought to   extension, full extension with good flexion stability with the patella   tracking through the trochlea without application of pressure.  Given   all these findings, the trial components removed.  Final components were   opened and cement was mixed.  The knee was irrigated with normal saline   solution and pulse lavage.  The synovial lining was   then injected with 0.25% Marcaine with epinephrine and 1 cc of Toradol,   total of 61 cc.      The knee was irrigated.  Final implants were then cemented onto clean and   dried cut surfaces of bone with the knee brought to extension with a 12.5 mm trial insert.      Once the cement had fully cured, the excess cement was removed   throughout the knee.  I confirmed I was satisfied with the range of   motion and stability, and the final 12.5 mm PS insert was chosen.  It was   placed into the knee.      The tourniquet had been let down at 36 minutes.  No significant   hemostasis required.  The medium Hemovac drain was placed deep.  The   extensor mechanism was then reapproximated using #1 Vicryl with the knee   in flexion.  The   remaining wound was closed with 2-0 Vicryl and running 4-0 Monocryl.   The knee was cleaned, dried, dressed sterilely using Dermabond and   Aquacel dressing.  Drain site dressed separately.  The patient was  then   brought to recovery room in stable condition, tolerating the procedure   well.   Please note that Physician Assistant, Lanney Gins, was present for the entirety of the case, and was utilized for pre-operative positioning, peri-operative retractor management, general facilitation of the procedure.  He was also utilized for primary wound closure at the end of the case.              Madlyn Frankel Charlann Boxer, M.D.

## 2012-08-31 NOTE — Anesthesia Preprocedure Evaluation (Addendum)
Anesthesia Evaluation  Patient identified by MRN, date of birth, ID band Patient awake    Reviewed: Allergy & Precautions, H&P , NPO status , Patient's Chart, lab work & pertinent test results  Airway Mallampati: II TM Distance: >3 FB Neck ROM: Full    Dental  (+) Teeth Intact and Dental Advisory Given   Pulmonary neg pulmonary ROS,  breath sounds clear to auscultation  Pulmonary exam normal       Cardiovascular hypertension, Pt. on medications Rhythm:Regular Rate:Normal     Neuro/Psych negative neurological ROS  negative psych ROS   GI/Hepatic negative GI ROS, Neg liver ROS,   Endo/Other  negative endocrine ROS  Renal/GU Renal disease     Musculoskeletal  (+) Arthritis -, Osteoarthritis,    Abdominal   Peds  Hematology negative hematology ROS (+)   Anesthesia Other Findings   Reproductive/Obstetrics                           Anesthesia Physical  Anesthesia Plan  ASA: IV  Anesthesia Plan: Spinal   Post-op Pain Management:    Induction:   Airway Management Planned: Simple Face Mask  Additional Equipment:   Intra-op Plan:   Post-operative Plan: Extubation in OR  Informed Consent: I have reviewed the patients History and Physical, chart, labs and discussed the procedure including the risks, benefits and alternatives for the proposed anesthesia with the patient or authorized representative who has indicated his/her understanding and acceptance.   Dental advisory given  Plan Discussed with: CRNA  Anesthesia Plan Comments: (Had detailed discussion about WPW and risks of her operation. Will plan spinal and external defib pads.)      Anesthesia Quick Evaluation

## 2012-08-31 NOTE — Transfer of Care (Signed)
Immediate Anesthesia Transfer of Care Note  Patient: Kimberly Larsen  Procedure(s) Performed: Procedure(s) with comments: RIGHT TOTAL KNEE ARTHROPLASTY (Right) - with LMA  Patient Location: PACU  Anesthesia Type:General and Spinal  Level of Consciousness: awake, alert , oriented and patient cooperative  Airway & Oxygen Therapy: Patient Spontanous Breathing and Patient connected to face mask oxygen  Post-op Assessment: Report given to PACU RN and Post -op Vital signs reviewed and stable  Post vital signs: Reviewed and stable  Complications: No apparent anesthesia complications

## 2012-09-01 ENCOUNTER — Encounter (HOSPITAL_COMMUNITY): Payer: Self-pay | Admitting: Orthopedic Surgery

## 2012-09-01 LAB — CBC
HCT: 31.1 % — ABNORMAL LOW (ref 36.0–46.0)
Hemoglobin: 10.1 g/dL — ABNORMAL LOW (ref 12.0–15.0)
MCH: 30 pg (ref 26.0–34.0)
MCHC: 32.5 g/dL (ref 30.0–36.0)
MCV: 92.3 fL (ref 78.0–100.0)
Platelets: 238 10*3/uL (ref 150–400)
RBC: 3.37 MIL/uL — ABNORMAL LOW (ref 3.87–5.11)
RDW: 13.8 % (ref 11.5–15.5)
WBC: 11.7 10*3/uL — ABNORMAL HIGH (ref 4.0–10.5)

## 2012-09-01 LAB — BASIC METABOLIC PANEL
BUN: 13 mg/dL (ref 6–23)
CO2: 30 mEq/L (ref 19–32)
Calcium: 8.5 mg/dL (ref 8.4–10.5)
Chloride: 103 mEq/L (ref 96–112)
Creatinine, Ser: 0.63 mg/dL (ref 0.50–1.10)
GFR calc Af Amer: >90 mL/min (ref >90)
GFR calc non Af Amer: >90 mL/min (ref >90)
Glucose, Bld: 114 mg/dL — ABNORMAL HIGH (ref 70–99)
Potassium: 4.1 mEq/L (ref 3.5–5.1)
Sodium: 139 mEq/L (ref 135–145)

## 2012-09-01 MED ORDER — LISINOPRIL 20 MG PO TABS
20.0000 mg | ORAL_TABLET | Freq: Two times a day (BID) | ORAL | Status: DC
  Administered 2012-09-01 – 2012-09-04 (×5): 20 mg via ORAL
  Filled 2012-09-01 (×9): qty 1

## 2012-09-01 MED ORDER — LISINOPRIL 20 MG PO TABS: 20.0000 mg | ORAL_TABLET | Freq: Every day | ORAL | Status: DC

## 2012-09-01 MED ORDER — HYDROCHLOROTHIAZIDE 12.5 MG PO CAPS
12.5000 mg | ORAL_CAPSULE | Freq: Two times a day (BID) | ORAL | Status: DC
  Administered 2012-09-01 – 2012-09-04 (×6): 12.5 mg via ORAL
  Filled 2012-09-01 (×7): qty 1

## 2012-09-01 MED ORDER — HYDROCHLOROTHIAZIDE 12.5 MG PO CAPS: 12.5000 mg | ORAL_CAPSULE | Freq: Every day | ORAL | Status: DC

## 2012-09-01 NOTE — Progress Notes (Signed)
Clinical Social Work Department BRIEF PSYCHOSOCIAL ASSESSMENT 09/01/2012  Patient:  Kimberly Larsen, Kimberly Larsen     Account Number:  [id number]     Admit date:  08/31/2012  Clinical Social Worker:  Candie Chroman  Date/Time:  09/01/2012 12:24 PM  Referred by:  Physician  Date Referred:  09/01/2012 Referred for  SNF Placement   Other Referral:   Interview type:  Patient Other interview type:    PSYCHOSOCIAL DATA Living Status:  ALONE Admitted from facility:   Level of care:   Primary support name:  Victorino Dike Sutphin Primary support relationship to patient:  CHILD, ADULT Degree of support available:   supportive    CURRENT CONCERNS Current Concerns  Post-Acute Placement   Other Concerns:    SOCIAL WORK ASSESSMENT / PLAN Pt is a 67 yr old female living at home prior to hospitalization. CSW met with pt / daughter to assist with d/c planning. Pt has made prior arrangements to have ST Rehab at [facility]. SNF has been contacted and plan confirmed pending [employer] authorization. CSW will assist with authorization process and continue to follow to assist with d/c planning needs.   Assessment/plan status:  Psychosocial Support/Ongoing Assessment of Needs Other assessment/ plan:   Information/referral to community resources:   Insurance coverage reviewed.    PATIENT'S/FAMILY'S RESPONSE TO PLAN OF CARE: Pt is looking forward to having rehab at [facility].    Cori Razor LCSW [phone]

## 2012-09-01 NOTE — Progress Notes (Signed)
Physical Therapy Treatment Patient Details Name: Kimberly Larsen MRN: [id number] DOB: 1945/07/30 Today's Date: 09/01/2012 Time: 4098-1191 PT Time Calculation (min): 33 min  PT Assessment / Plan / Recommendation Comments on Treatment Session  Progressing slowly with mobility. still experiencing some pain control issues that are limiting mobility. recommend snf.     Follow Up Recommendations  SNF     Does the patient have the potential to tolerate intense rehabilitation     Barriers to Discharge        Equipment Recommendations  None recommended by PT    Recommendations for Other Services    Frequency 7X/week   Plan Discharge plan remains appropriate    Precautions / Restrictions Precautions Precautions: Knee;Fall Restrictions Weight Bearing Restrictions: No RLE Weight Bearing: Weight bearing as tolerated   Pertinent Vitals/Pain 8/10 R knee posterolateral    Mobility  Bed Mobility Bed Mobility: Supine to Sit;Sit to Supine Supine to Sit: 4: Min assist Details for Bed Mobility Assistance: Assist for R LE off bed Transfers Transfers: Sit to Stand;Stand to Sit Sit to Stand: 4: Min assist;From bed;From toilet Stand to Sit: 4: Min assist;To bed;To toilet Details for Transfer Assistance: VCs safety, technique, hand placement. Assist to rise, stabilize, control descent Ambulation/Gait Ambulation/Gait Assistance: 4: Min guard Ambulation Distance (Feet): 65 Feet Assistive device: Rolling walker Ambulation/Gait Assistance Details: VCS safety, technique, sequence, step length, distance from RW. Assist to stabilize throughout ambulation Gait Pattern: Step-to pattern;Antalgic;Decreased stride length;Decreased step length - right;Decreased step length - left    Exercises Total Joint Exercises Ankle Circles/Pumps: AROM;Both;10 reps;Supine Quad Sets: AROM;Both;10 reps;Supine Short Arc Quad: AAROM;Right;10 reps;Supine Heel Slides: AAROM;Right;10 reps;Supine Hip ABduction/ADduction:  AAROM;Right;10 reps;Supine Straight Leg Raises: AAROM;Right;10 reps;Supine   PT Diagnosis:    PT Problem List:   PT Treatment Interventions:     PT Goals Acute Rehab PT Goals Pt will go Supine/Side to Sit: with supervision PT Goal: Supine/Side to Sit - Progress: Progressing toward goal Pt will go Sit to Supine/Side: with supervision PT Goal: Sit to Supine/Side - Progress: Progressing toward goal Pt will go Sit to Stand: with supervision PT Goal: Sit to Stand - Progress: Progressing toward goal Pt will Ambulate: 51 - 150 feet;with supervision;with rolling walker PT Goal: Ambulate - Progress: Progressing toward goal Pt will Perform Home Exercise Program: with supervision, verbal cues required/provided PT Goal: Perform Home Exercise Program - Progress: Progressing toward goal  Visit Information  Last PT Received On: 09/01/12 Assistance Needed: +1    Subjective Data  Subjective: I was hurting a lot earlier. i was crying again Patient Stated Goal: regain independence.    Cognition  Cognition Arousal/Alertness: Awake/alert Behavior During Therapy: WFL for tasks assessed/performed Overall Cognitive Status: Within Functional Limits for tasks assessed    Balance     End of Session PT - End of Session Activity Tolerance: Patient limited by pain Patient left: in bed;with call bell/phone within reach   GP     [name], MPT Pager: [phone]

## 2012-09-01 NOTE — Progress Notes (Signed)
Clinical Social Work Department CLINICAL SOCIAL WORK PLACEMENT NOTE 09/01/2012  Patient:  Kimberly Larsen, Kimberly Larsen  Account Number:  [id number] Admit date:  08/31/2012  Clinical Social Worker:  Cori Razor, LCSW  Date/time:  09/01/2012 12:30 PM  Clinical Social Work is seeking post-discharge placement for this patient at the following level of care:   SKILLED NURSING   (*CSW will update this form in Epic as items are completed)     Patient/family provided with [facility] Department of Clinical Social Work's list of facilities offering this level of care within the geographic area requested by the patient (or if unable, by the patient's family).  09/01/2012  Patient/family informed of their freedom to choose among providers that offer the needed level of care, that participate in Medicare, Medicaid or managed care program needed by the patient, have an available bed and are willing to accept the patient.    Patient/family informed of [facility]' ownership interest in [facility], as well as of the fact that they are under no obligation to receive care at this facility.  PASARR submitted to EDS on 08/31/2012 PASARR number received from [employer] on 08/31/2012  FL2 transmitted to all facilities in geographic area requested by pt/family on  09/01/2012 FL2 transmitted to all facilities within larger geographic area on   Patient informed that his/her managed care company has contracts with or will negotiate with  certain facilities, including the following:     Patient/family informed of bed offers received:  09/01/2012 Patient chooses bed at [facility] Physician recommends and patient chooses bed at    Patient to be transferred to [facility] on   Patient to be transferred to facility by   The following physician request were entered in Epic:   Additional Comments:  Cori Razor LCSW [phone]

## 2012-09-01 NOTE — Progress Notes (Signed)
   Subjective: 1 Day Post-Op Procedure(s) (LRB): RIGHT TOTAL KNEE ARTHROPLASTY (Right)   Patient reports pain as mild, pain well controlled. No events throughout the night.  Objective:   VITALS:   Filed Vitals:   09/01/12 1332  BP: 180/82  Pulse: 103  Temp: 98.1 F (36.7 C)  Resp: 18    Neurovascular intact Dorsiflexion/Plantar flexion intact Incision: dressing C/D/I No cellulitis present Compartment soft  LABS  Recent Labs  09/01/12 0435  HGB 10.1*  HCT 31.1*  WBC 11.7*  PLT 238     Recent Labs  09/01/12 0435  NA 139  K 4.1  BUN 13  CREATININE 0.63  GLUCOSE 114*     Assessment/Plan: 1 Day Post-Op Procedure(s) (LRB): RIGHT TOTAL KNEE ARTHROPLASTY (Right) HV drain d/c'ed Foley cath d/c'ed Advance diet Up with therapy D/C IV fluids Discharge to SNF eventually, when ready  Expected ABLA  Treated with iron and will observe  Obese (BMI 30-39.9) Estimated body mass index is 35.66 kg/(m^2) as calculated from the following:   Height as of this encounter: 5\' 2"  (1.575 m).   Weight as of this encounter: 88.451 kg (195 lb). Patient also counseled that weight may inhibit the healing process Patient counseled that losing weight will help with future health issues     Anastasio Auerbach. Ercole Georg   PAC  09/01/2012, 2:55 PM

## 2012-09-01 NOTE — Progress Notes (Signed)
Utilization review completed.  

## 2012-09-01 NOTE — Progress Notes (Signed)
Physical Therapy Treatment Patient Details Name: Kimberly Larsen MRN: [id number] DOB: 02/16/1946 Today's Date: 09/01/2012 Time: 1130-1200 PT Time Calculation (min): 30 min  PT Assessment / Plan / Recommendation Comments on Treatment Session  progressing with mobility. some issues with pain, spasms this am. continue to recommend snf.     Follow Up Recommendations  SNF     Does the patient have the potential to tolerate intense rehabilitation     Barriers to Discharge        Equipment Recommendations  None recommended by PT    Recommendations for Other Services    Frequency 7X/week   Plan Discharge plan remains appropriate    Precautions / Restrictions Precautions Precautions: Knee;Fall Restrictions Weight Bearing Restrictions: No RLE Weight Bearing: Weight bearing as tolerated   Pertinent Vitals/Pain 9/10 r knee with activity    Mobility  Bed Mobility Bed Mobility: Supine to Sit Supine to Sit: 4: Min assist Details for Bed Mobility Assistance: Assist for R LE off bed Transfers Transfers: Sit to Stand;Stand to Sit Sit to Stand: 4: Min assist;From bed Stand to Sit: 4: Min assist;To chair/3-in-1 Details for Transfer Assistance: VCs safety, technique, hand placement. Assist to rise, stabilize, control descent Ambulation/Gait Ambulation/Gait Assistance: 4: Min assist Ambulation Distance (Feet): 50 Feet Assistive device: Rolling walker Ambulation/Gait Assistance Details: VCS safety, technique, sequence, step length, distance from RW. Assist to stabilize throughout ambulation Gait Pattern: Step-to pattern;Decreased stride length;Antalgic;Decreased step length - right    Exercises Total Joint Exercises Ankle Circles/Pumps: AROM;Both;10 reps;Supine Quad Sets: AROM;Both;10 reps;Supine Short Arc Quad: AAROM;Right;10 reps;Supine Heel Slides: AAROM;Right;10 reps;Supine Hip ABduction/ADduction: AAROM;Right;10 reps;Supine Straight Leg Raises: AAROM;Right;10 reps;Supine   PT  Diagnosis:    PT Problem List:   PT Treatment Interventions:     PT Goals Acute Rehab PT Goals Pt will go Supine/Side to Sit: with supervision PT Goal: Supine/Side to Sit - Progress: Progressing toward goal Pt will go Sit to Stand: with supervision PT Goal: Sit to Stand - Progress: Progressing toward goal Pt will Ambulate: 51 - 150 feet;with supervision;with rolling walker PT Goal: Ambulate - Progress: Progressing toward goal Pt will Perform Home Exercise Program: with supervision, verbal cues required/provided PT Goal: Perform Home Exercise Program - Progress: Progressing toward goal  Visit Information  Last PT Received On: 09/01/12 Assistance Needed: +1    Subjective Data  Subjective: i was cramping bad earlier...i was crying Patient Stated Goal: regain independence.    Cognition  Cognition Arousal/Alertness: Awake/alert Behavior During Therapy: WFL for tasks assessed/performed Overall Cognitive Status: Within Functional Limits for tasks assessed    Balance     End of Session PT - End of Session Activity Tolerance: Patient limited by pain Patient left: in chair;with call bell/phone within reach   GP     [name], MPT Pager: [phone]

## 2012-09-02 DIAGNOSIS — E669 Obesity, unspecified: Secondary | ICD-10-CM

## 2012-09-02 DIAGNOSIS — D500 Iron deficiency anemia secondary to blood loss (chronic): Secondary | ICD-10-CM | POA: Diagnosis not present

## 2012-09-02 HISTORY — DX: Obesity, unspecified: E66.9

## 2012-09-02 HISTORY — DX: Iron deficiency anemia secondary to blood loss (chronic): D50.0

## 2012-09-02 LAB — BASIC METABOLIC PANEL
BUN: 13 mg/dL (ref 6–23)
CO2: 31 mEq/L (ref 19–32)
Calcium: 9.1 mg/dL (ref 8.4–10.5)
Chloride: 97 mEq/L (ref 96–112)
Creatinine, Ser: 0.51 mg/dL (ref 0.50–1.10)
GFR calc Af Amer: >90 mL/min (ref >90)
GFR calc non Af Amer: >90 mL/min (ref >90)
Glucose, Bld: 127 mg/dL — ABNORMAL HIGH (ref 70–99)
Potassium: 3.8 mEq/L (ref 3.5–5.1)
Sodium: 133 mEq/L — ABNORMAL LOW (ref 135–145)

## 2012-09-02 LAB — CBC
HCT: 32.2 % — ABNORMAL LOW (ref 36.0–46.0)
Hemoglobin: 10.2 g/dL — ABNORMAL LOW (ref 12.0–15.0)
MCH: 29.1 pg (ref 26.0–34.0)
MCHC: 31.7 g/dL (ref 30.0–36.0)
MCV: 91.7 fL (ref 78.0–100.0)
Platelets: 217 10*3/uL (ref 150–400)
RBC: 3.51 MIL/uL — ABNORMAL LOW (ref 3.87–5.11)
RDW: 14.1 % (ref 11.5–15.5)
WBC: 9.1 10*3/uL (ref 4.0–10.5)

## 2012-09-02 NOTE — Progress Notes (Signed)
   Subjective: 2 Days Post-Op Procedure(s) (LRB): RIGHT TOTAL KNEE ARTHROPLASTY (Right)   Patient reports pain as mild , pain well controlled. No events throughout the night. States she feels some anxiety with the whole process but xanax is helping.  Objective:   VITALS:   Filed Vitals:   09/02/12 0550  BP: 145/83  Pulse: 102  Temp: 98.1 F (36.7 C)  Resp: 18    Neurovascular intact Dorsiflexion/Plantar flexion intact Incision: dressing C/D/I No cellulitis present Compartment soft  LABS  Recent Labs  09/01/12 0435 09/02/12 0500  HGB 10.1* 10.2*  HCT 31.1* 32.2*  WBC 11.7* 9.1  PLT 238 217     Recent Labs  09/01/12 0435 09/02/12 0500  NA 139 133*  K 4.1 3.8  BUN 13 13  CREATININE 0.63 0.51  GLUCOSE 114* 127*     Assessment/Plan: 2 Days Post-Op Procedure(s) (LRB): RIGHT TOTAL KNEE ARTHROPLASTY (Right) Up with therapy Discharge to SNF eventually when ready  Expected ABLA  Treated with iron and will observe   Obese (BMI 30-39.9)  Estimated body mass index is 35.66 kg/(m^2) as calculated from the following:     Height as of this encounter: 5\' 2"  (1.575 m).     Weight as of this encounter: 88.451 kg (195 lb).  Patient also counseled that weight may inhibit the healing process  Patient counseled that losing weight will help with future health issues     Anastasio Auerbach. Chantell Kunkler   PAC  09/02/2012, 9:05 AM

## 2012-09-02 NOTE — Evaluation (Signed)
Occupational Therapy Evaluation Patient Details Name: Kimberly Larsen MRN: [id number] DOB: 03/02/1946 Today's Date: 09/02/2012 Time: 1100-1115 OT Time Calculation (min): 15 min  OT Assessment / Plan / Recommendation Clinical Impression  Pt is s/p R TKA and is doing well. Overall min assist with ADL. She will benefit from skilled OT services to improve safety and strength for increased independence with ADL.     OT Assessment  Patient needs continued OT Services    Follow Up Recommendations  SNF;Supervision/Assistance - 24 hour    Barriers to Discharge      Equipment Recommendations  None recommended by OT    Recommendations for Other Services    Frequency  Min 2X/week    Precautions / Restrictions Precautions Precautions: Knee;Fall Restrictions Weight Bearing Restrictions: No RLE Weight Bearing: Weight bearing as tolerated        ADL  Eating/Feeding: Independent Where Assessed - Eating/Feeding: Chair Grooming: Wash/dry hands;Set up Where Assessed - Grooming: Supported sitting Upper Body Bathing: Chest;Right arm;Left arm;Abdomen;Set up Where Assessed - Upper Body Bathing: Unsupported sitting Lower Body Bathing: Minimal assistance Where Assessed - Lower Body Bathing: Supported sit to stand Upper Body Dressing: Set up Where Assessed - Upper Body Dressing: Unsupported sitting Lower Body Dressing: Minimal assistance Where Assessed - Lower Body Dressing: Supported sit to stand Toilet Transfer: Lobbyist: Raised toilet seat with arms (or 3-in-1 over toilet) Toileting - Clothing Manipulation and Hygiene: Min guard Where Assessed - Engineer, mining and Hygiene: Sit to stand from 3-in-1 or toilet Equipment Used: Rolling walker ADL Comments: Pt motivated and wants to return to independence. Pt reports she had back surgery last year and used AE for LB ADL but got to where she could bring LE up to her to dress. DIscussed AE options to use  initially to increase independence.     OT Diagnosis: Generalized weakness  OT Problem List: Decreased strength OT Treatment Interventions: Self-care/ADL training;DME and/or AE instruction;Patient/family education;Therapeutic activities   OT Goals Acute Rehab OT Goals OT Goal Formulation: With patient Time For Goal Achievement: 09/09/12 Potential to Achieve Goals: Good ADL Goals Pt Will Perform Grooming: with supervision;Standing at sink ADL Goal: Grooming - Progress: Goal set today Pt Will Perform Lower Body Bathing: with supervision;with adaptive equipment;Sit to stand from chair;Sit to stand from bed ADL Goal: Lower Body Bathing - Progress: Goal set today Pt Will Perform Lower Body Dressing: with supervision;with adaptive equipment;Sit to stand from chair;Sit to stand from bed ADL Goal: Lower Body Dressing - Progress: Goal set today Pt Will Transfer to Toilet: with supervision;Ambulation;3-in-1;with DME ADL Goal: Toilet Transfer - Progress: Goal set today Pt Will Perform Toileting - Clothing Manipulation: with supervision;Standing ADL Goal: Toileting - Clothing Manipulation - Progress: Goal set today  Visit Information  Last OT Received On: 09/02/12 Assistance Needed: +1    Subjective Data  Subjective: I had back surgery last year Patient Stated Goal: to get back home to her kitty   Prior Functioning     Home Living Home Adaptive Equipment: Bedside commode/3-in-1;Reacher;Sock aid;Straight cane;Long-handled shoehorn;Long-handled sponge Additional Comments: pt planning to d/c to SNF Prior Function Level of Independence: Independent Communication Communication: No difficulties         Vision/Perception     Cognition  Cognition Arousal/Alertness: Awake/alert Behavior During Therapy: WFL for tasks assessed/performed Overall Cognitive Status: Within Functional Limits for tasks assessed    Extremity/Trunk Assessment Right Upper Extremity Assessment RUE  ROM/Strength/Tone: [facility] for tasks assessed Left Upper Extremity Assessment LUE  ROM/Strength/Tone: [facility] for tasks assessed     Mobility Transfers Transfers: Sit to Stand;Stand to Sit Sit to Stand: 4: Min guard;With upper extremity assist;From chair/3-in-1 Stand to Sit: 4: Min guard;With upper extremity assist;To chair/3-in-1;To bed Details for Transfer Assistance: min verbal cues for hand placement     Exercise     Balance Balance Balance Assessed: Yes Dynamic Standing Balance Dynamic Standing - Level of Assistance: 4: Min assist;Other (comment) (min guard)   End of Session OT - End of Session Activity Tolerance: Patient tolerated treatment well Patient left: Other (comment) (at EOB with PT)  GO     Lennox Laity [phone] 09/02/2012, 11:30 AM

## 2012-09-02 NOTE — Progress Notes (Signed)
Physical Therapy Treatment Patient Details Name: Kimberly Larsen MRN: [id number] DOB: [birth year] Today's Date: 09/02/2012 Time: 1525-1550 PT Time Calculation (min): 25 min  PT Assessment / Plan / Recommendation Comments on Treatment Session  Continue to recommend snf.     Follow Up Recommendations  SNF     Does the patient have the potential to tolerate intense rehabilitation     Barriers to Discharge        Equipment Recommendations  None recommended by PT    Recommendations for Other Services OT consult  Frequency 7X/week   Plan Discharge plan remains appropriate    Precautions / Restrictions Precautions Precautions: Knee;Fall Restrictions Weight Bearing Restrictions: No RLE Weight Bearing: Weight bearing as tolerated   Pertinent Vitals/Pain 5/10 R knee    Mobility  Bed Mobility Bed Mobility: Sit to Supine Sit to Supine: 4: Min assist Details for Bed Mobility Assistance: Assist for R LE onto bed Transfers Transfers: Sit to Stand;Stand to Sit Sit to Stand: 4: Min assist;From chair/3-in-1 Stand to Sit: 4: Min guard;To bed;To toilet Details for Transfer Assistance: min verbal cues for hand placement. Assist to rise, stabilize with initial standing Ambulation/Gait Ambulation/Gait Assistance: 4: Min guard Ambulation Distance (Feet): 83 Feet Assistive device: Rolling walker Gait Pattern: Step-to pattern;Antalgic;Decreased stride length;Decreased step length - right    Exercises Total Joint Exercises Ankle Circles/Pumps: AROM;Right;10 reps;Supine Quad Sets: AROM;Right;10 reps;Supine Short Arc Quad: AROM;Right;10 reps;Supine Heel Slides: AAROM;Right;10 reps;Supine Hip ABduction/ADduction: AROM;Right;10 reps;Supine Straight Leg Raises: AAROM;Right;10 reps;Supine   PT Diagnosis:    PT Problem List:   PT Treatment Interventions:     PT Goals Acute Rehab PT Goals Pt will go Sit to Supine/Side: with supervision PT Goal: Sit to Supine/Side - Progress: Progressing  toward goal Pt will go Sit to Stand: with supervision PT Goal: Sit to Stand - Progress: Progressing toward goal Pt will Ambulate: 51 - 150 feet;with supervision;with rolling walker PT Goal: Ambulate - Progress: Progressing toward goal Pt will Perform Home Exercise Program: with supervision, verbal cues required/provided PT Goal: Perform Home Exercise Program - Progress: Progressing toward goal  Visit Information  Last PT Received On: 09/02/12 Assistance Needed: +1    Subjective Data      Cognition       Balance     End of Session PT - End of Session Activity Tolerance: Patient tolerated treatment well Patient left: in bed;with call bell/phone within reach   GP     [name], MPT Pager: [phone]

## 2012-09-02 NOTE — Care Management Note (Signed)
    Page 1 of 1   09/02/2012     7:37:34 PM   CARE MANAGEMENT NOTE 09/02/2012  Patient:  Kimberly Larsen, Kimberly Larsen   Account Number:  [id number]  Date Initiated:  09/02/2012  Documentation initiated by:  Colleen Can  Subjective/Objective Assessment:   dx rt knee osteoarthritis; total knee replacemnt     Action/Plan:   SNF rehab   Anticipated DC Date:  09/03/2012   Anticipated DC Plan:  SKILLED NURSING FACILITY  In-house referral  Clinical Social Worker      DC Planning Services  CM consult      Choice offered to / List presented to:             Status of service:  Completed, signed off Medicare Important Message given?   (If response is "NO", the following Medicare IM given date fields will be blank) Date Medicare IM given:   Date Additional Medicare IM given:    Discharge Disposition:    Per UR Regulation:    If discussed at Long Length of Stay Meetings, dates discussed:    Comments:

## 2012-09-02 NOTE — Progress Notes (Signed)
Physical Therapy Treatment Patient Details Name: Kimberly Larsen MRN: [id number] DOB: January 24, 1946 Today's Date: 09/02/2012 Time: 4098-1191 PT Time Calculation (min): 10 min  PT Assessment / Plan / Recommendation Comments on Treatment Session       Follow Up Recommendations  SNF     Does the patient have the potential to tolerate intense rehabilitation     Barriers to Discharge        Equipment Recommendations  None recommended by PT    Recommendations for Other Services    Frequency 7X/week   Plan Discharge plan remains appropriate    Precautions / Restrictions Precautions Precautions: Knee;Fall Restrictions Weight Bearing Restrictions: No RLE Weight Bearing: Weight bearing as tolerated   Pertinent Vitals/Pain 6/10 R knee    Mobility  Bed Mobility Bed Mobility: Not assessed Transfers Transfers: Sit to Stand;Stand to Sit Sit to Stand: 4: Min guard;From bed Stand to Sit: 4: Min guard;To chair/3-in-1 Details for Transfer Assistance: min verbal cues for hand placement Ambulation/Gait Ambulation/Gait Assistance: 4: Min guard Ambulation Distance (Feet): 75 Feet Assistive device: Rolling walker Ambulation/Gait Assistance Details: slow gait speed.  Gait Pattern: Step-to pattern;Antalgic;Decreased stride length;Decreased step length - right;Decreased step length - left    Exercises     PT Diagnosis:    PT Problem List:   PT Treatment Interventions:     PT Goals Acute Rehab PT Goals Pt will go Sit to Stand: with supervision PT Goal: Sit to Stand - Progress: Progressing toward goal Pt will Ambulate: 51 - 150 feet;with supervision;with rolling walker PT Goal: Ambulate - Progress: Progressing toward goal  Visit Information  Last PT Received On: 09/02/12 Assistance Needed: +1    Subjective Data  Subjective: I had another anxiety attack last night Patient Stated Goal: regain independence.    Cognition  Cognition Arousal/Alertness: Awake/alert Behavior During  Therapy: WFL for tasks assessed/performed Overall Cognitive Status: Within Functional Limits for tasks assessed    Balance  Balance Balance Assessed: Yes Dynamic Standing Balance Dynamic Standing - Level of Assistance: 4: Min assist;Other (comment) (min guard)  End of Session PT - End of Session Activity Tolerance: Patient limited by pain Patient left: in chair;with call bell/phone within reach   GP     [name], MPT Pager: [phone]

## 2012-09-03 MED ORDER — ASPIRIN EC 325 MG PO TBEC: 325.0000 mg | DELAYED_RELEASE_TABLET | Freq: Two times a day (BID) | ORAL | Status: DC

## 2012-09-03 MED ORDER — OXYCODONE HCL 5 MG PO TABS: 5.0000 mg | ORAL_TABLET | ORAL | Status: DC

## 2012-09-03 MED ORDER — POLYETHYLENE GLYCOL 3350 17 G PO PACK: 17.0000 g | PACK | Freq: Two times a day (BID) | ORAL | Status: DC

## 2012-09-03 MED ORDER — METHOCARBAMOL 500 MG PO TABS: 500.0000 mg | ORAL_TABLET | Freq: Four times a day (QID) | ORAL | Status: DC | PRN

## 2012-09-03 MED ORDER — FERROUS SULFATE 325 (65 FE) MG PO TABS: 325.0000 mg | ORAL_TABLET | Freq: Three times a day (TID) | ORAL | Status: DC

## 2012-09-03 MED ORDER — DSS 100 MG PO CAPS: 100.0000 mg | ORAL_CAPSULE | Freq: Two times a day (BID) | ORAL | Status: DC

## 2012-09-03 NOTE — Discharge Summary (Addendum)
Physician Discharge Summary  [name] MRN: [id number] DOB/AGE: 06/19/1945 67 y.o.  Admit date: 08/31/2012 Discharge date:  09/04/2012  Procedures:  Procedure(s) (LRB): RIGHT TOTAL KNEE ARTHROPLASTY (Right)  Attending Physician:  Dr. Durene Romans   Admission Diagnoses:   Right knee OA / pain  Discharge Diagnoses:  Principal Problem:   S/P right TKA Active Problems:   Obesity (BMI 30-39.9)   Expected blood loss anemia  Past Medical History  Diagnosis Date  . Hypertension   . Arthritis   . HLD (hyperlipidemia)   . History of kidney stones     HPI: Kimberly Larsen, 67 y.o. female, has a history of pain and functional disability in the right knee due to arthritis and has failed non-surgical conservative treatments for greater than 12 weeks to includeNSAID's and/or analgesics, corticosteriod injections, viscosupplementation injections, supervised PT with diminished ADL's post treatment, use of assistive devices and activity modification. Onset of symptoms was gradual, starting 2 years ago with rapidlly worsening course since that time. The patient noted no past surgery on the right knee(s). Patient currently rates pain in the right knee(s) at 8 out of 10 with activity. Patient has worsening of pain with activity and weight bearing, pain that interferes with activities of daily living, pain with passive range of motion, crepitus and joint swelling. Patient has evidence of periarticular osteophytes and joint space narrowing by imaging studies. There is no active infection. Risks, benefits and expectations were discussed with the patient. Patient understand the risks, benefits and expectations and wishes to proceed with surgery.    PCP: [name], MD   Discharged Condition: good  Hospital Course:  Patient underwent the above stated procedure on 08/31/2012. Patient tolerated the procedure well and brought to the recovery room in good condition and subsequently to the  floor.  POD #1 BP: 180/82 ; Pulse: 103 ; Temp: 98.1 F (36.7 C) ; Resp: 18  Pt's foley was removed, as well as the hemovac drain removed. IV was changed to a saline lock. Patient reports pain as mild, pain well controlled. No events throughout the night. Neurovascular intact, dorsiflexion/plantar flexion intact, incision: dressing C/D/I, no cellulitis present and compartment soft.   LABS  Basename  09/01/12     0435   HGB  10.1  HCT  31.1   POD #2  BP: 145/83 ; Pulse: 102 ; Temp: 98.1 F (36.7 C) ; Resp: 18 Patient reports pain as mild , pain well controlled. No events throughout the night. States she feels some anxiety with the whole process but Xanax is helping. Neurovascular intact, dorsiflexion/plantar flexion intact, incision: dressing C/D/I, no cellulitis present and compartment soft.   LABS  Basename  09/02/12    0500   HGB  10.2  HCT  32.2   POD #3  BP: 135/63 ; Pulse: 102 ; Temp: 98.7 F (37.1 C) ; Resp: 16  Patient reports pain as mild, pain well controlled. No events throughout the night. Ready to be discharged today. Neurovascular intact, dorsiflexion/plantar flexion intact, incision: dressing C/D/I, no cellulitis present and compartment soft.   LABS   No new labs  POD#4 BP: 135/83 ; Pulse: 100 ; Temp: 98 F (36.7 C) ; Resp: 18 Patient reports pain as mild, pain well controlled. No events throughout the night. Working slowly with PT. Not comfortable yet to return home. Should be ready for SNF today. Neurovascular intact, dorsiflexion/plantar flexion intact, incision: dressing C/D/I, no cellulitis present and compartment soft.   LABS  No new labs   Discharge Exam: General appearance: alert, cooperative and no distress Extremities: Homans sign is negative, no sign of DVT, no edema, redness or tenderness in the calves or thighs and no ulcers, gangrene or trophic changes  Disposition:    SNF  with follow up in 2 weeks   Follow-up Information   Follow up with  [name], MD. Schedule an appointment as soon as possible for a visit in 2 weeks.   Contact information:   [address] [place] [address] [phone]       Discharge Orders   Future Orders Complete By Expires     Call MD / Call 911  As directed     Comments:      If you experience chest pain or shortness of breath, CALL 911 and be transported to the hospital emergency room.  If you develope a fever above 101 F, pus (white drainage) or increased drainage or redness at the wound, or calf pain, call your surgeon's office.    Change dressing  As directed     Comments:      Maintain surgical dressing for 10-14 days, then change the dressing daily with sterile 4 x 4 inch gauze dressing and tape. Keep the area dry and clean.    Constipation Prevention  As directed     Comments:      Drink plenty of fluids.  Prune juice may be helpful.  You may use a stool softener, such as Colace (over the counter) 100 mg twice a day.  Use MiraLax (over the counter) for constipation as needed.    Diet - low sodium heart healthy  As directed     Discharge instructions  As directed     Comments:      Maintain surgical dressing for 10-14 days, then replace with gauze and tape. Keep the area dry and clean until follow up. Follow up in 2 weeks at [facility]. Call with any questions or concerns.    Driving restrictions  As directed     Comments:      No driving for 4 weeks    Increase activity slowly as tolerated  As directed     TED hose  As directed     Comments:      Use stockings (TED hose) for 2 weeks on both leg(s).  You may remove them at night for sleeping.    Weight bearing as tolerated  As directed          Medication List    STOP taking these medications       cyclobenzaprine 10 MG tablet  Commonly known as:  FLEXERIL     nabumetone 500 MG tablet  Commonly known as:  RELAFEN     oxyCODONE-acetaminophen 10-325 MG per tablet  Commonly known as:   PERCOCET      TAKE these medications       ALPRAZolam 0.25 MG tablet  Commonly known as:  XANAX  Take 0.25 mg by mouth 2 (two) times daily as needed for anxiety.     aspirin EC 325 MG tablet  Take 1 tablet (325 mg total) by mouth 2 (two) times daily.     BIOTIN PO  Take 10,000 mcg by mouth daily.     diphenhydrAMINE 25 mg capsule  Commonly known as:  BENADRYL  Take 25 mg by mouth at bedtime.     DSS 100 MG Caps  Take 100 mg by mouth 2 (  two) times daily.     DULoxetine 60 MG capsule  Commonly known as:  CYMBALTA  Take 60 mg by mouth daily.     ferrous sulfate 325 (65 FE) MG tablet  Take 1 tablet (325 mg total) by mouth 3 (three) times daily after meals.     Fish Oil 600 MG Caps  Take 1 capsule by mouth 2 (two) times daily.     Melatonin 3 MG Tabs  Take 1 tablet by mouth at bedtime.     methocarbamol 500 MG tablet  Commonly known as:  ROBAXIN  Take 1 tablet (500 mg total) by mouth every 6 (six) hours as needed (muscle spasms).     multivitamin with minerals Tabs  Take 1 tablet by mouth daily.     oxyCODONE 5 MG immediate release tablet  Commonly known as:  Oxy IR/ROXICODONE  Take 1-3 tablets (5-15 mg total) by mouth every 4 (four) hours.     polyethylene glycol packet  Commonly known as:  MIRALAX / GLYCOLAX  Take 17 g by mouth 2 (two) times daily.     pravastatin 40 MG tablet  Commonly known as:  PRAVACHOL  Take 40 mg by mouth at bedtime.     quinapril-hydrochlorothiazide 20-12.5 MG per tablet  Commonly known as:  ACCURETIC  Take 1 tablet by mouth 2 (two) times daily.     SUPER B COMPLEX PO  Take 1 tablet by mouth daily.     traZODone 50 MG tablet  Commonly known as:  DESYREL  Take 25-50 mg by mouth at bedtime.     Zinc 50 MG Caps  Take 1 capsule by mouth daily.         Signed: Anastasio Auerbach. Cyani Kallstrom   PAC  09/03/2012, 9:26 AM

## 2012-09-03 NOTE — Progress Notes (Signed)
CSW assisting with d/c planning. SNF contacted for update. ADM has been unable to connect with [employer] CM  for authorization. CSW contacted supervisor at [facility] for assistance. SNF contacted and supervisor's recommendation given to ADM.  Supervisor at [employer] will assist with authorization but did indicate that it is unlikely a decision will be made today. Will update pt / nsg.  Cori Razor LCSW [phone]

## 2012-09-03 NOTE — Progress Notes (Signed)
Physical Therapy Treatment Patient Details Name: Kimberly Larsen MRN: [id number] DOB: May 24, 1945 Today's Date: 09/03/2012 Time: 8295-6213 PT Time Calculation (min): 14 min  PT Assessment / Plan / Recommendation Comments on Treatment Session  Pt c/o back and knee pain this session. Gave warm packs for back and ice packs for knee. Plan is for rehab today pending insurance approval.     Follow Up Recommendations  SNF     Does the patient have the potential to tolerate intense rehabilitation     Barriers to Discharge        Equipment Recommendations  None recommended by PT    Recommendations for Other Services OT consult  Frequency 7X/week   Plan Discharge plan remains appropriate    Precautions / Restrictions Precautions Precautions: Knee;Fall Precaution Comments: hx of back surgery-pt c/o some pain today Restrictions Weight Bearing Restrictions: No RLE Weight Bearing: Weight bearing as tolerated   Pertinent Vitals/Pain 8/10-back and R knee    Mobility  Bed Mobility Bed Mobility: Not assessed Transfers Transfers: Sit to Stand;Stand to Sit Sit to Stand: From chair/3-in-1;With armrests;4: Min assist Stand to Sit: 4: Min guard;To chair/3-in-1;With armrests Details for Transfer Assistance: Assist to stabilize walker during transition and to stabilize pt initially Ambulation/Gait Ambulation/Gait Assistance: 4: Min guard Ambulation Distance (Feet): 90 Feet Assistive device: Rolling walker Ambulation/Gait Assistance Details: slow gait speed. pt c/o knee pain and back pain.  Gait Pattern: Step-to pattern;Antalgic;Decreased stride length;Decreased step length - right    Exercises     PT Diagnosis:    PT Problem List:   PT Treatment Interventions:     PT Goals Acute Rehab PT Goals Pt will go Sit to Stand: with supervision PT Goal: Sit to Stand - Progress: Progressing toward goal Pt will Ambulate: 51 - 150 feet;with supervision;with rolling walker PT Goal: Ambulate -  Progress: Progressing toward goal  Visit Information  Last PT Received On: 09/03/12 Assistance Needed: +1    Subjective Data  Subjective: There were more tears today. Im just ready to go Patient Stated Goal: regain independence.    Cognition  Cognition Arousal/Alertness: Awake/alert Behavior During Therapy: WFL for tasks assessed/performed Overall Cognitive Status: Within Functional Limits for tasks assessed    Balance     End of Session PT - End of Session Activity Tolerance: Patient limited by pain Patient left: in chair;with call bell/phone within reach   GP     [name], MPT Pager: [phone]

## 2012-09-03 NOTE — Progress Notes (Signed)
Physical Therapy Treatment Patient Details Name: Kimberly Larsen MRN: [id number] DOB: [birth year] Today's Date: 09/03/2012 Time: 1440-1453 PT Time Calculation (min): 13 min  PT Assessment / Plan / Recommendation Comments on Treatment Session  Pt still waiting to hear from insurance company. ROM exercises for 2nd session. Will see in am if pt is still here.     Follow Up Recommendations  SNF     Does the patient have the potential to tolerate intense rehabilitation     Barriers to Discharge        Equipment Recommendations  None recommended by PT    Recommendations for Other Services OT consult  Frequency 7X/week   Plan Discharge plan remains appropriate    Precautions / Restrictions Precautions Precautions: Knee;Fall Precaution Comments: hx of back surgery-pt c/o some pain today Restrictions Weight Bearing Restrictions: No RLE Weight Bearing: Weight bearing as tolerated   Pertinent Vitals/Pain 5/10 R knee with activity    Mobility     Exercises Total Joint Exercises Ankle Circles/Pumps: AROM;Both;10 reps;Supine Quad Sets: AROM;Both;10 reps;Supine Short Arc Quad: AROM;Right;10 reps;Supine Heel Slides: AAROM;Right;10 reps;Supine Hip ABduction/ADduction: AROM;Right;10 reps;Supine Straight Leg Raises: AAROM;Right;10 reps;Supine   PT Diagnosis:    PT Problem List:   PT Treatment Interventions:     PT Goals Acute Rehab PT Goals Pt will go Sit to Stand: with supervision PT Goal: Sit to Stand - Progress: Progressing toward goal Pt will Ambulate: 51 - 150 feet;with supervision;with rolling walker PT Goal: Ambulate - Progress: Progressing toward goal Pt will Perform Home Exercise Program: with supervision, verbal cues required/provided PT Goal: Perform Home Exercise Program - Progress: Progressing toward goal  Visit Information  Last PT Received On: 09/03/12 Assistance Needed: +1    Subjective Data  Subjective: There were more tears today. Im just ready to go Patient  Stated Goal: regain independence.    Cognition  Cognition Arousal/Alertness: Awake/alert Behavior During Therapy: WFL for tasks assessed/performed Overall Cognitive Status: Within Functional Limits for tasks assessed    Balance     End of Session PT - End of Session Activity Tolerance: Patient tolerated treatment well Patient left: in bed;with call bell/phone within reach   GP     [name], MPT Pager: [phone]

## 2012-09-03 NOTE — Progress Notes (Signed)
   Subjective: 3 Days Post-Op Procedure(s) (LRB): RIGHT TOTAL KNEE ARTHROPLASTY (Right)   Patient reports pain as mild, pain well controlled. No events throughout the night. Ready to be discharged today.  Objective:   VITALS:   Filed Vitals:   09/03/12 0602  BP: 135/63  Pulse: 102  Temp: 98.7 F (37.1 C)  Resp: 16    Neurovascular intact Dorsiflexion/Plantar flexion intact Incision: dressing C/D/I No cellulitis present Compartment soft  LABS  Recent Labs  09/01/12 0435 09/02/12 0500  HGB 10.1* 10.2*  HCT 31.1* 32.2*  WBC 11.7* 9.1  PLT 238 217     Recent Labs  09/01/12 0435 09/02/12 0500  NA 139 133*  K 4.1 3.8  BUN 13 13  CREATININE 0.63 0.51  GLUCOSE 114* 127*     Assessment/Plan: 3 Days Post-Op Procedure(s) (LRB): RIGHT TOTAL KNEE ARTHROPLASTY (Right) Up with therapy Discharge to SNF [facility]) Follow up in 2 weeks at [facility]. Follow up with OLIN,[place] D in 2 weeks.  Contact information:  [facility] [address] [place] [address] [phone]       Anastasio Auerbach. Maricia Scotti   PAC  09/03/2012, 9:09 AM

## 2012-09-04 ENCOUNTER — Telehealth: Payer: Self-pay | Admitting: *Deleted

## 2012-09-04 MED ORDER — ALPRAZOLAM 0.25 MG PO TABS: ORAL_TABLET | ORAL | Status: DC

## 2012-09-04 MED ORDER — OXYCODONE HCL 5 MG PO TABS: ORAL_TABLET | ORAL | Status: DC

## 2012-09-04 NOTE — Progress Notes (Signed)
   Subjective: 4 Days Post-Op Procedure(s) (LRB): RIGHT TOTAL KNEE ARTHROPLASTY (Right)   Patient reports pain as mild, pain well controlled. No events throughout the night. Working slowly with PT. Not comfortable yet to return home.  Should be ready for SNF today.  Objective:   VITALS:   Filed Vitals:   09/04/12 0644  BP: 135/83  Pulse: 100  Temp: 98 F (36.7 C)  Resp: 18    Neurovascular intact Dorsiflexion/Plantar flexion intact Incision: dressing C/D/I No cellulitis present Compartment soft  LABS  Recent Labs  09/02/12 0500  HGB 10.2*  HCT 32.2*  WBC 9.1  PLT 217     Recent Labs  09/02/12 0500  NA 133*  K 3.8  BUN 13  CREATININE 0.51  GLUCOSE 127*     Assessment/Plan: 4 Days Post-Op Procedure(s) (LRB): RIGHT TOTAL KNEE ARTHROPLASTY (Right) Up with therapy Discharge to SNF [facility]) today Follow up in 2 weeks at [facility]. Follow up with OLIN,Fedor Kazmierski D in 2 weeks.  Contact information:  [facility] [address] [place] [address] [phone]        Anastasio Auerbach. Jillana Selph   PAC  09/04/2012, 8:24 AM

## 2012-09-04 NOTE — Progress Notes (Signed)
Clinical Social Work Department CLINICAL SOCIAL WORK PLACEMENT NOTE 09/04/2012  Patient:  Kimberly Larsen, Kimberly Larsen  Account Number:  [id number] Admit date:  08/31/2012  Clinical Social Worker:  Cori Razor, LCSW  Date/time:  09/01/2012 12:30 PM  Clinical Social Work is seeking post-discharge placement for this patient at the following level of care:   SKILLED NURSING   (*CSW will update this form in Epic as items are completed)     Patient/family provided with [facility] Department of Clinical Social Work's list of facilities offering this level of care within the geographic area requested by the patient (or if unable, by the patient's family).  09/01/2012  Patient/family informed of their freedom to choose among providers that offer the needed level of care, that participate in Medicare, Medicaid or managed care program needed by the patient, have an available bed and are willing to accept the patient.    Patient/family informed of [facility]' ownership interest in [facility], as well as of the fact that they are under no obligation to receive care at this facility.  PASARR submitted to EDS on 08/31/2012 PASARR number received from EDS on 08/31/2012  FL2 transmitted to all facilities in geographic area requested by pt/family on  09/01/2012 FL2 transmitted to all facilities within larger geographic area on   Patient informed that his/her managed care company has contracts with or will negotiate with  certain facilities, including the following:     Patient/family informed of bed offers received:  09/01/2012 Patient chooses bed at [facility] Physician recommends and patient chooses bed at    Patient to be transferred to [facility] on   Patient to be transferred to facility by   The following physician request were entered in Epic:   Additional Comments:  [employer] provided authorization for SNF placement.  Cori Razor LCSW [phone]

## 2012-09-04 NOTE — Progress Notes (Signed)
Physical Therapy Treatment Patient Details Name: Kimberly Larsen MRN: [id number] DOB: 21-Apr-1946 Today's Date: 09/04/2012 Time: 4098-1191 PT Time Calculation (min): 8 min  PT Assessment / Plan / Recommendation Comments on Treatment Session  Continuing to progress with mobility. Pt is still waiting for approval from insurance company. continue to recommend snf.     Follow Up Recommendations  SNF     Does the patient have the potential to tolerate intense rehabilitation     Barriers to Discharge        Equipment Recommendations  None recommended by PT    Recommendations for Other Services    Frequency 7X/week   Plan Discharge plan remains appropriate    Precautions / Restrictions Precautions Precautions: Knee;Fall Restrictions Weight Bearing Restrictions: No RLE Weight Bearing: Weight bearing as tolerated   Pertinent Vitals/Pain 5/10 R knee    Mobility  Bed Mobility Bed Mobility: Supine to Sit;Sit to Supine Supine to Sit: 4: Min guard Sit to Supine: 4: Min assist Details for Bed Mobility Assistance: Assist for R LE onto bed Transfers Transfers: Sit to Stand;Stand to Sit Sit to Stand: 4: Min guard;From bed Stand to Sit: 4: Min guard;To bed Details for Transfer Assistance: VCS safety, hand placement Ambulation/Gait Ambulation/Gait Assistance: 4: Min guard Ambulation Distance (Feet): 100 Feet Assistive device: Rolling walker Ambulation/Gait Assistance Details: slow gait speed.  Gait Pattern: Step-to pattern;Step-through pattern;Decreased stride length;Decreased step length - right    Exercises    PT Diagnosis:    PT Problem List:   PT Treatment Interventions:     PT Goals Acute Rehab PT Goals Pt will go Supine/Side to Sit: with supervision PT Goal: Supine/Side to Sit - Progress: Progressing toward goal Pt will go Sit to Supine/Side: with supervision PT Goal: Sit to Supine/Side - Progress: Progressing toward goal Pt will go Sit to Stand: with supervision PT Goal:  Sit to Stand - Progress: Progressing toward goal Pt will Ambulate: 51 - 150 feet;with supervision;with rolling walker PT Goal: Ambulate - Progress: Progressing toward goal Pt will Perform Home Exercise Program: with supervision, verbal cues required/provided PT Goal: Perform Home Exercise Program - Progress: Progressing toward goal  Visit Information  Last PT Received On: 09/04/12 Assistance Needed: +1    Subjective Data  Subjective: I hope I get to go today Patient Stated Goal: regain independence. rehab   Cognition  Cognition Arousal/Alertness: Awake/alert Behavior During Therapy: WFL for tasks assessed/performed Overall Cognitive Status: Within Functional Limits for tasks assessed    Balance     End of Session PT - End of Session Activity Tolerance: Patient tolerated treatment well Patient left: in bed;with call bell/phone within reach   GP     [name], MPT Pager: [phone]

## 2012-09-04 NOTE — Progress Notes (Signed)
Physical Therapy Treatment Patient Details Name: Kimberly Larsen MRN: [id number] DOB: 12/28/1945 Today's Date: 09/04/2012 Time: 7829-5621 PT Time Calculation (min): 9 min  PT Assessment / Plan / Recommendation Comments on Treatment Session  Pt stated she had just been helped back to bed. ROM exercises only this session. Pt requested PT check back later for OOB/ambulation.     Follow Up Recommendations  SNF     Does the patient have the potential to tolerate intense rehabilitation     Barriers to Discharge        Equipment Recommendations  None recommended by PT    Recommendations for Other Services    Frequency 7X/week   Plan Discharge plan remains appropriate    Precautions / Restrictions Precautions Precautions: Knee;Fall Restrictions Weight Bearing Restrictions: No RLE Weight Bearing: Weight bearing as tolerated   Pertinent Vitals/Pain 5/10 R knee with activity    Mobility  Bed Mobility Bed Mobility: Not assessed Transfers Transfers: Not assessed Ambulation/Gait Ambulation/Gait Assistance: Not tested (comment)    Exercises Total Joint Exercises Ankle Circles/Pumps: AROM;Both;15 reps;Supine Quad Sets: AROM;Both;10 reps;Supine Short Arc Quad: AROM;Right;10 reps;Supine Heel Slides: AAROM;Right;10 reps;Supine Hip ABduction/ADduction: AROM;Right;10 reps;Supine Straight Leg Raises: AAROM;Right;10 reps;Supine Goniometric ROM: 5-48 degrees   PT Diagnosis:    PT Problem List:   PT Treatment Interventions:     PT Goals Acute Rehab PT Goals Pt will Perform Home Exercise Program: with supervision, verbal cues required/provided PT Goal: Perform Home Exercise Program - Progress: Progressing toward goal  Visit Information  Last PT Received On: 09/04/12 Assistance Needed: +1    Subjective Data  Subjective: I just got back in the bed Patient Stated Goal: regain independence. rehab   Cognition  Cognition Arousal/Alertness: Awake/alert Behavior During Therapy: WFL  for tasks assessed/performed Overall Cognitive Status: Within Functional Limits for tasks assessed    Balance     End of Session PT - End of Session Activity Tolerance: Patient tolerated treatment well Patient left: in bed;with call bell/phone within reach   GP     [name], MPT Pager: [phone]

## 2012-09-04 NOTE — Telephone Encounter (Signed)
Error

## 2012-09-07 ENCOUNTER — Non-Acute Institutional Stay (SKILLED_NURSING_FACILITY): Payer: BC Managed Care – PPO | Admitting: Internal Medicine

## 2012-09-07 DIAGNOSIS — Z96659 Presence of unspecified artificial knee joint: Secondary | ICD-10-CM

## 2012-09-07 DIAGNOSIS — G4700 Insomnia, unspecified: Secondary | ICD-10-CM

## 2012-09-07 DIAGNOSIS — M797 Fibromyalgia: Secondary | ICD-10-CM

## 2012-09-07 DIAGNOSIS — R2681 Unsteadiness on feet: Secondary | ICD-10-CM

## 2012-09-07 DIAGNOSIS — IMO0001 Reserved for inherently not codable concepts without codable children: Secondary | ICD-10-CM

## 2012-09-07 DIAGNOSIS — R269 Unspecified abnormalities of gait and mobility: Secondary | ICD-10-CM

## 2012-09-07 DIAGNOSIS — Z96651 Presence of right artificial knee joint: Secondary | ICD-10-CM

## 2012-09-07 DIAGNOSIS — I10 Essential (primary) hypertension: Secondary | ICD-10-CM

## 2012-09-07 NOTE — Progress Notes (Signed)
[name], female   DOB: Oct 19, 1945, 67 y.o.   MRN: [id number]    PCP: [name], MD  Code Status: full code  No Known Allergies  Chief Complaint: new admit post hospitalization 08/31/12- 09/04/12  HPI:  Kimberly Larsen, 67 y.o. Female was having pain and functional disability in the right knee due to arthritis and had failed non-surgical conservative treatments and underwent right total knee arthroplasty during this hospitalization. She tolerated the procedure well and is here for STR. She was seen in her room today. She mentions that she would like to verify her bp medication. Her pain is not under control and she also complaints of muscle spasm. She is on prn medication but not asking for them as she forgets to ask for them  Review of Systems:  Review of Systems  Constitutional: Negative for fever, chills and weight loss.  HENT: Negative for congestion.   Eyes: Negative for blurred vision.  Respiratory: Negative for cough and shortness of breath.   Cardiovascular: Positive for leg swelling. Negative for chest pain and palpitations.  Gastrointestinal: Negative for heartburn, abdominal pain, diarrhea and constipation.  Genitourinary: Negative for dysuria.  Musculoskeletal: Positive for joint pain.  Skin: Negative for itching and rash.  Neurological: Negative for dizziness, tremors, weakness and headaches.  Psychiatric/Behavioral: Negative for depression and memory loss.   Procedures:  Procedure(s) (LRB): RIGHT TOTAL KNEE ARTHROPLASTY (Right)  Past Medical History  Diagnosis Date  . Hypertension   . Arthritis   . HLD (hyperlipidemia)   . History of kidney stones    Past Surgical History  Procedure Laterality Date  . Tubal ligation    . Ganglion cyst excision      L ankle  . Rhinoplasty    . Lithotripsy    . Carpal tunnel release      R hand  . Back surgery  2013  . Total knee arthroplasty Right 08/31/2012    Procedure: RIGHT TOTAL KNEE ARTHROPLASTY;   Surgeon: [name], MD;  Location: [facility];  Service: Orthopedics;  Laterality: Right;  with LMA   Social History:   reports that she has never smoked. She does not have any smokeless tobacco history on file. She reports that she does not drink alcohol or use illicit drugs.  No family history on file.  Medications: Patient's Medications  New Prescriptions   No medications on file  Previous Medications   ALPRAZOLAM (XANAX) 0.25 MG TABLET    Take 1 tablet twice a day as needed for anxiety   ASPIRIN EC 325 MG TABLET    Take 1 tablet (325 mg total) by mouth 2 (two) times daily.   B COMPLEX-C (SUPER B COMPLEX PO)    Take 1 tablet by mouth daily.   BIOTIN PO    Take 10,000 mcg by mouth daily.   DOCUSATE SODIUM 100 MG CAPS    Take 100 mg by mouth 2 (two) times daily.   DULOXETINE (CYMBALTA) 60 MG CAPSULE    Take 60 mg by mouth daily.   FERROUS SULFATE 325 (65 FE) MG TABLET    Take 1 tablet (325 mg total) by mouth 3 (three) times daily after meals.   MELATONIN 3 MG TABS    Take 1 tablet by mouth at bedtime.   METHOCARBAMOL (ROBAXIN) 500 MG TABLET    Take 1 tablet (500 mg total) by mouth every 6 (six) hours as needed (muscle spasms).   MULTIPLE VITAMIN (MULTIVITAMIN WITH MINERALS) TABS  Take 1 tablet by mouth daily.   OMEGA-3 FATTY ACIDS (FISH OIL) 600 MG CAPS    Take 1 capsule by mouth 2 (two) times daily.   OXYCODONE (OXY IR/ROXICODONE) 5 MG IMMEDIATE RELEASE TABLET    Take 1 tablet every 4 hours as needed for mild pain Take 2 tablets every 4 hours as needed for moderate pain Take 3 tablets every 4 hours as needed for severe pain   POLYETHYLENE GLYCOL (MIRALAX / GLYCOLAX) PACKET    Take 17 g by mouth 2 (two) times daily.   PRAVASTATIN (PRAVACHOL) 40 MG TABLET    Take 40 mg by mouth at bedtime.    QUINAPRIL-HYDROCHLOROTHIAZIDE (ACCURETIC) 20-12.5 MG PER TABLET    Take 1 tablet by mouth 2 (two) times daily.    TRAZODONE (DESYREL) 50 MG TABLET    Take 25-50 mg by mouth at bedtime.   ZINC 50  MG CAPS    Take 1 capsule by mouth daily.  Modified Medications   No medications on file  Discontinued Medications   DIPHENHYDRAMINE (BENADRYL) 25 MG CAPSULE    Take 25 mg by mouth at bedtime.    Physical Exam: Filed Vitals:   09/07/12 1835  BP: 126/62  Pulse: 73  Temp: 98.6 F (37 C)  Resp: 17  SpO2: 97%   General- elderly female in nad heent- no palor, no icterus, MMM, no LAD, no JVD cvs- n s1,s2, rrr, no m/r/g respi- b/l CTA, no wheeze/ rhonchi abdo- bowel sound present, soft, non tender Musculoskeletal-  Has dressing and splint in place, dressing dry and clean, normal pedal pulses, dorsiflexion and plantar flexion intact, trace edema in right leg, mild spasticity present on knee extension Neuro- non focal, aaox3  Labs reviewed: Basic Metabolic Panel:  Recent Labs  16/10/96 1100 09/01/12 0435 09/02/12 0500  NA 141 139 133*  K 3.7 4.1 3.8  CL 102 103 97  CO2 30 30 31   GLUCOSE 93 114* 127*  BUN 16 13 13   CREATININE 0.67 0.63 0.51  CALCIUM 9.6 8.5 9.1   Liver Function Tests:  Recent Labs  10/22/11 0857  AST 23  ALT 24  ALKPHOS 110  BILITOT 0.5  PROT 7.0  ALBUMIN 3.7   CBC:  Recent Labs  10/22/11 0857 08/26/12 1100 09/01/12 0435 09/02/12 0500  WBC 12.0* 7.9 11.7* 9.1  NEUTROABS 8.8*  --   --   --   HGB 14.0 13.3 10.1* 10.2*  HCT 42.5 39.0 31.1* 32.2*  MCV 94.2 90.3 92.3 91.7  PLT 319 280 238 217    Assessment/Plan HTN- bp well controlled. Continue quinapril-hctz, monitor vitals  Right knee arthroplasty- hx of right knee OA and underwent right knee arthroplasty. With her pain not being under control and also having muscle spasm, will d/c current pain med regimen. Will have her on oxycodone 10 mg q12h and oxycodone-apap 5-325 mg 1 -2 tab q4h prn for breakthrough pain.will prescribe thigh high ted hose. Also change robaxin to 500 mg q6h standing for a week and then prn to help with muscle spasm. Continue bowel regimen. On asa for dvt  prophylaxis  Insomnia- Change trazodone to 50 mg qhs with 25 mg qhs prn additional. D/c melatonin  Fibromyalgia- on cymbalta for this and prn alprazolam to help with naxiety, monitor  Unsteady gait- post right TKA, to work with OT/OT for gait training. Adjusted pain medication and muscle relaxant, reassess. Fall precautions while moving around.  Family/ staff Communication: reviewed care plan with patient and nursing  supervisor.   Goals of care: return home after completion of therapy   Labs/tests ordered- cbc, bmp

## 2012-09-08 ENCOUNTER — Other Ambulatory Visit: Payer: Self-pay | Admitting: Geriatric Medicine

## 2012-09-08 ENCOUNTER — Encounter: Payer: Self-pay | Admitting: Internal Medicine

## 2012-09-08 DIAGNOSIS — M797 Fibromyalgia: Secondary | ICD-10-CM

## 2012-09-08 DIAGNOSIS — G4700 Insomnia, unspecified: Secondary | ICD-10-CM | POA: Insufficient documentation

## 2012-09-08 HISTORY — DX: Insomnia, unspecified: G47.00

## 2012-09-08 HISTORY — DX: Fibromyalgia: M79.7

## 2012-09-08 MED ORDER — OXYCODONE-ACETAMINOPHEN 5-325 MG PO TABS: ORAL_TABLET | ORAL | Status: DC

## 2012-09-08 MED ORDER — OXYCODONE HCL 10 MG PO TABS: ORAL_TABLET | ORAL | Status: DC

## 2012-09-09 ENCOUNTER — Other Ambulatory Visit: Payer: Self-pay | Admitting: Geriatric Medicine

## 2012-09-09 MED ORDER — OXYCODONE-ACETAMINOPHEN 5-325 MG PO TABS: ORAL_TABLET | ORAL | Status: DC

## 2012-09-11 ENCOUNTER — Non-Acute Institutional Stay (SKILLED_NURSING_FACILITY): Payer: BC Managed Care – PPO | Admitting: Nurse Practitioner

## 2012-09-11 DIAGNOSIS — I10 Essential (primary) hypertension: Secondary | ICD-10-CM

## 2012-09-11 DIAGNOSIS — R269 Unspecified abnormalities of gait and mobility: Secondary | ICD-10-CM

## 2012-09-11 DIAGNOSIS — IMO0001 Reserved for inherently not codable concepts without codable children: Secondary | ICD-10-CM

## 2012-09-11 DIAGNOSIS — R2681 Unsteadiness on feet: Secondary | ICD-10-CM

## 2012-09-11 DIAGNOSIS — M797 Fibromyalgia: Secondary | ICD-10-CM

## 2012-09-11 DIAGNOSIS — Z96659 Presence of unspecified artificial knee joint: Secondary | ICD-10-CM

## 2012-09-11 DIAGNOSIS — Z96651 Presence of right artificial knee joint: Secondary | ICD-10-CM

## 2012-09-11 MED ORDER — QUINAPRIL-HYDROCHLOROTHIAZIDE 20-12.5 MG PO TABS: 1.0000 | ORAL_TABLET | Freq: Two times a day (BID) | ORAL | Status: DC

## 2012-09-11 MED ORDER — DULOXETINE HCL 60 MG PO CPEP: 60.0000 mg | ORAL_CAPSULE | Freq: Every day | ORAL | Status: DC

## 2012-09-11 MED ORDER — DSS 100 MG PO CAPS: 100.0000 mg | ORAL_CAPSULE | Freq: Two times a day (BID) | ORAL | Status: DC

## 2012-09-11 MED ORDER — ADULT MULTIVITAMIN W/MINERALS CH: 1.0000 | ORAL_TABLET | Freq: Every day | ORAL | Status: DC

## 2012-09-11 MED ORDER — TRAZODONE HCL 50 MG PO TABS: 25.0000 mg | ORAL_TABLET | Freq: Every day | ORAL | Status: DC

## 2012-09-11 MED ORDER — OXYCODONE HCL 10 MG PO TABS: ORAL_TABLET | ORAL | Status: DC

## 2012-09-11 MED ORDER — POLYETHYLENE GLYCOL 3350 17 G PO PACK: 17.0000 g | PACK | Freq: Two times a day (BID) | ORAL | Status: DC

## 2012-09-11 MED ORDER — METHOCARBAMOL 500 MG PO TABS: 500.0000 mg | ORAL_TABLET | Freq: Four times a day (QID) | ORAL | Status: DC | PRN

## 2012-09-11 MED ORDER — SUPER B COMPLEX PO TABS: 1.0000 | ORAL_TABLET | Freq: Every day | ORAL | Status: DC

## 2012-09-11 MED ORDER — ZINC 50 MG PO CAPS: 1.0000 | ORAL_CAPSULE | Freq: Every day | ORAL | Status: DC

## 2012-09-11 MED ORDER — PRAVASTATIN SODIUM 40 MG PO TABS: 40.0000 mg | ORAL_TABLET | Freq: Every day | ORAL | Status: DC

## 2012-09-11 MED ORDER — BIOTIN 5000 MCG PO TABS: 2.0000 | ORAL_TABLET | Freq: Every day | ORAL | Status: DC

## 2012-09-11 MED ORDER — OXYCODONE-ACETAMINOPHEN 5-325 MG PO TABS: ORAL_TABLET | ORAL | Status: DC

## 2012-09-11 MED ORDER — FISH OIL 600 MG PO CAPS: 1.0000 | ORAL_CAPSULE | Freq: Two times a day (BID) | ORAL | Status: DC

## 2012-09-11 MED ORDER — ASPIRIN EC 325 MG PO TBEC: 325.0000 mg | DELAYED_RELEASE_TABLET | Freq: Two times a day (BID) | ORAL | Status: DC

## 2012-09-11 MED ORDER — MELATONIN 3 MG PO TABS: 1.0000 | ORAL_TABLET | Freq: Every day | ORAL | Status: DC

## 2012-09-11 MED ORDER — ALPRAZOLAM 0.25 MG PO TABS: ORAL_TABLET | ORAL | Status: DC

## 2012-09-11 MED ORDER — FERROUS SULFATE 325 (65 FE) MG PO TABS: 325.0000 mg | ORAL_TABLET | Freq: Three times a day (TID) | ORAL | Status: DC

## 2012-09-11 NOTE — Progress Notes (Signed)
  Subjective:    [name], female    DOB: 08-Feb-1946, 67 y.o.   MRN: [id number]  HPI Comments: Patient was seen today for discharge. She is alert and oriented, up in chair ready to go home. She is s/p right TKR and was here for STR. She ambulates with a walker and requires minimal assistance. She says her pain is under control when she gets her pain meds as ordered.     Review of Systems  All other systems reviewed and are negative.   LABS:  09/08/12: wbc 8.5, hgb 10.4, hct 30.7, plt 326, na 140, k 4.0, glucose  99, bun 18, creatinine 0.70, calcium 8.9, cholesterol 141, triglyceride 135, hdl 33.     Objective:   Physical Exam  Constitutional: She is oriented to person, place, and time. She appears well-developed and well-nourished. No distress.  Eyes: Pupils are equal, round, and reactive to light.  Cardiovascular: Normal rate and regular rhythm.   Pulmonary/Chest: Effort normal and breath sounds normal. No respiratory distress. She has no wheezes.  Musculoskeletal: Normal range of motion.  Neurological: She is alert and oriented to person, place, and time.  Skin: Skin is warm. She is not diaphoretic.  Psychiatric: She has a normal mood and affect.       Assessment & Plan:  Pt will discharge home with home health pt/ot. DME: [facility].  - Rx's written for controlled meds. All other rx's were faxed to [facility] on [address] in [place].  - Pt is to follow up with pcp in 1-2 weeks. Follow up with ortho as directed.

## 2013-02-03 DIAGNOSIS — E7800 Pure hypercholesterolemia, unspecified: Secondary | ICD-10-CM | POA: Diagnosis not present

## 2013-02-03 DIAGNOSIS — Z1239 Encounter for other screening for malignant neoplasm of breast: Secondary | ICD-10-CM | POA: Diagnosis not present

## 2013-02-03 DIAGNOSIS — I10 Essential (primary) hypertension: Secondary | ICD-10-CM | POA: Diagnosis not present

## 2013-02-03 DIAGNOSIS — Z23 Encounter for immunization: Secondary | ICD-10-CM | POA: Diagnosis not present

## 2013-02-08 DIAGNOSIS — R262 Difficulty in walking, not elsewhere classified: Secondary | ICD-10-CM | POA: Diagnosis not present

## 2013-02-15 DIAGNOSIS — Z1231 Encounter for screening mammogram for malignant neoplasm of breast: Secondary | ICD-10-CM | POA: Diagnosis not present

## 2013-03-03 DIAGNOSIS — M169 Osteoarthritis of hip, unspecified: Secondary | ICD-10-CM | POA: Diagnosis not present

## 2013-03-09 DIAGNOSIS — M9981 Other biomechanical lesions of cervical region: Secondary | ICD-10-CM | POA: Diagnosis not present

## 2013-03-09 DIAGNOSIS — M5126 Other intervertebral disc displacement, lumbar region: Secondary | ICD-10-CM | POA: Diagnosis not present

## 2013-03-09 DIAGNOSIS — M999 Biomechanical lesion, unspecified: Secondary | ICD-10-CM | POA: Diagnosis not present

## 2013-03-09 DIAGNOSIS — M5020 Other cervical disc displacement, unspecified cervical region: Secondary | ICD-10-CM | POA: Diagnosis not present

## 2013-03-19 DIAGNOSIS — M778 Other enthesopathies, not elsewhere classified: Secondary | ICD-10-CM | POA: Diagnosis not present

## 2013-04-05 DIAGNOSIS — M25519 Pain in unspecified shoulder: Secondary | ICD-10-CM | POA: Diagnosis not present

## 2013-04-07 DIAGNOSIS — M25519 Pain in unspecified shoulder: Secondary | ICD-10-CM | POA: Diagnosis not present

## 2013-04-09 DIAGNOSIS — M25519 Pain in unspecified shoulder: Secondary | ICD-10-CM | POA: Diagnosis not present

## 2013-04-13 DIAGNOSIS — M25519 Pain in unspecified shoulder: Secondary | ICD-10-CM | POA: Diagnosis not present

## 2013-04-14 DIAGNOSIS — M25519 Pain in unspecified shoulder: Secondary | ICD-10-CM | POA: Diagnosis not present

## 2013-04-16 DIAGNOSIS — M25519 Pain in unspecified shoulder: Secondary | ICD-10-CM | POA: Diagnosis not present

## 2013-04-19 DIAGNOSIS — M25519 Pain in unspecified shoulder: Secondary | ICD-10-CM | POA: Diagnosis not present

## 2013-04-20 DIAGNOSIS — M25519 Pain in unspecified shoulder: Secondary | ICD-10-CM | POA: Diagnosis not present

## 2013-04-26 DIAGNOSIS — M25519 Pain in unspecified shoulder: Secondary | ICD-10-CM | POA: Diagnosis not present

## 2013-05-01 DIAGNOSIS — S46919A Strain of unspecified muscle, fascia and tendon at shoulder and upper arm level, unspecified arm, initial encounter: Secondary | ICD-10-CM | POA: Diagnosis not present

## 2013-05-01 DIAGNOSIS — S46819A Strain of other muscles, fascia and tendons at shoulder and upper arm level, unspecified arm, initial encounter: Secondary | ICD-10-CM | POA: Diagnosis not present

## 2013-05-01 DIAGNOSIS — M19019 Primary osteoarthritis, unspecified shoulder: Secondary | ICD-10-CM | POA: Diagnosis not present

## 2013-05-01 DIAGNOSIS — M25519 Pain in unspecified shoulder: Secondary | ICD-10-CM | POA: Diagnosis not present

## 2013-05-01 DIAGNOSIS — M7530 Calcific tendinitis of unspecified shoulder: Secondary | ICD-10-CM | POA: Diagnosis not present

## 2013-05-01 DIAGNOSIS — M67919 Unspecified disorder of synovium and tendon, unspecified shoulder: Secondary | ICD-10-CM | POA: Diagnosis not present

## 2013-05-03 DIAGNOSIS — M75100 Unspecified rotator cuff tear or rupture of unspecified shoulder, not specified as traumatic: Secondary | ICD-10-CM | POA: Diagnosis not present

## 2013-05-03 DIAGNOSIS — M67919 Unspecified disorder of synovium and tendon, unspecified shoulder: Secondary | ICD-10-CM | POA: Diagnosis not present

## 2013-05-03 DIAGNOSIS — M719 Bursopathy, unspecified: Secondary | ICD-10-CM | POA: Diagnosis not present

## 2013-05-03 DIAGNOSIS — IMO0002 Reserved for concepts with insufficient information to code with codable children: Secondary | ICD-10-CM | POA: Diagnosis not present

## 2013-05-17 DIAGNOSIS — N959 Unspecified menopausal and perimenopausal disorder: Secondary | ICD-10-CM | POA: Diagnosis not present

## 2013-05-17 DIAGNOSIS — Z6837 Body mass index (BMI) 37.0-37.9, adult: Secondary | ICD-10-CM | POA: Diagnosis not present

## 2013-05-17 DIAGNOSIS — E7800 Pure hypercholesterolemia, unspecified: Secondary | ICD-10-CM | POA: Diagnosis not present

## 2013-05-17 DIAGNOSIS — I10 Essential (primary) hypertension: Secondary | ICD-10-CM | POA: Diagnosis not present

## 2013-05-17 DIAGNOSIS — Z0000 Encounter for general adult medical examination without abnormal findings: Secondary | ICD-10-CM | POA: Diagnosis not present

## 2013-05-17 DIAGNOSIS — R7301 Impaired fasting glucose: Secondary | ICD-10-CM | POA: Diagnosis not present

## 2013-05-26 DIAGNOSIS — M899 Disorder of bone, unspecified: Secondary | ICD-10-CM | POA: Diagnosis not present

## 2013-05-26 DIAGNOSIS — N959 Unspecified menopausal and perimenopausal disorder: Secondary | ICD-10-CM | POA: Diagnosis not present

## 2013-05-26 DIAGNOSIS — M949 Disorder of cartilage, unspecified: Secondary | ICD-10-CM | POA: Diagnosis not present

## 2013-07-14 DIAGNOSIS — M169 Osteoarthritis of hip, unspecified: Secondary | ICD-10-CM | POA: Diagnosis not present

## 2013-07-14 DIAGNOSIS — M1610 Unilateral primary osteoarthritis, unspecified hip: Secondary | ICD-10-CM | POA: Diagnosis not present

## 2013-07-14 DIAGNOSIS — M25559 Pain in unspecified hip: Secondary | ICD-10-CM | POA: Diagnosis not present

## 2013-08-17 DIAGNOSIS — E7800 Pure hypercholesterolemia, unspecified: Secondary | ICD-10-CM | POA: Diagnosis not present

## 2013-08-17 DIAGNOSIS — I10 Essential (primary) hypertension: Secondary | ICD-10-CM | POA: Diagnosis not present

## 2013-09-03 DIAGNOSIS — Z96659 Presence of unspecified artificial knee joint: Secondary | ICD-10-CM | POA: Diagnosis not present

## 2013-09-03 DIAGNOSIS — M25569 Pain in unspecified knee: Secondary | ICD-10-CM | POA: Diagnosis not present

## 2013-10-08 DIAGNOSIS — J069 Acute upper respiratory infection, unspecified: Secondary | ICD-10-CM | POA: Diagnosis not present

## 2013-10-11 DIAGNOSIS — J069 Acute upper respiratory infection, unspecified: Secondary | ICD-10-CM | POA: Diagnosis not present

## 2013-10-12 DIAGNOSIS — R918 Other nonspecific abnormal finding of lung field: Secondary | ICD-10-CM | POA: Diagnosis not present

## 2013-10-12 DIAGNOSIS — R05 Cough: Secondary | ICD-10-CM | POA: Diagnosis not present

## 2013-10-12 DIAGNOSIS — J40 Bronchitis, not specified as acute or chronic: Secondary | ICD-10-CM | POA: Diagnosis not present

## 2013-10-12 DIAGNOSIS — R059 Cough, unspecified: Secondary | ICD-10-CM | POA: Diagnosis not present

## 2013-10-20 DIAGNOSIS — M169 Osteoarthritis of hip, unspecified: Secondary | ICD-10-CM | POA: Diagnosis not present

## 2013-10-20 DIAGNOSIS — M1610 Unilateral primary osteoarthritis, unspecified hip: Secondary | ICD-10-CM | POA: Diagnosis not present

## 2013-11-18 DIAGNOSIS — E7800 Pure hypercholesterolemia, unspecified: Secondary | ICD-10-CM | POA: Diagnosis not present

## 2013-11-18 DIAGNOSIS — N390 Urinary tract infection, site not specified: Secondary | ICD-10-CM | POA: Diagnosis not present

## 2013-11-18 DIAGNOSIS — R5381 Other malaise: Secondary | ICD-10-CM | POA: Diagnosis not present

## 2013-11-18 DIAGNOSIS — I10 Essential (primary) hypertension: Secondary | ICD-10-CM | POA: Diagnosis not present

## 2013-11-18 DIAGNOSIS — R5383 Other fatigue: Secondary | ICD-10-CM | POA: Diagnosis not present

## 2013-11-25 DIAGNOSIS — F4542 Pain disorder with related psychological factors: Secondary | ICD-10-CM | POA: Diagnosis not present

## 2013-11-29 DIAGNOSIS — E7800 Pure hypercholesterolemia, unspecified: Secondary | ICD-10-CM | POA: Diagnosis not present

## 2013-11-29 DIAGNOSIS — R7301 Impaired fasting glucose: Secondary | ICD-10-CM | POA: Diagnosis not present

## 2013-11-29 DIAGNOSIS — R944 Abnormal results of kidney function studies: Secondary | ICD-10-CM | POA: Diagnosis not present

## 2013-11-29 DIAGNOSIS — W57XXXA Bitten or stung by nonvenomous insect and other nonvenomous arthropods, initial encounter: Secondary | ICD-10-CM | POA: Diagnosis not present

## 2013-11-29 DIAGNOSIS — T148 Other injury of unspecified body region: Secondary | ICD-10-CM | POA: Diagnosis not present

## 2013-12-02 DIAGNOSIS — M9981 Other biomechanical lesions of cervical region: Secondary | ICD-10-CM | POA: Diagnosis not present

## 2013-12-02 DIAGNOSIS — M5020 Other cervical disc displacement, unspecified cervical region: Secondary | ICD-10-CM | POA: Diagnosis not present

## 2013-12-02 DIAGNOSIS — M5126 Other intervertebral disc displacement, lumbar region: Secondary | ICD-10-CM | POA: Diagnosis not present

## 2013-12-02 DIAGNOSIS — M999 Biomechanical lesion, unspecified: Secondary | ICD-10-CM | POA: Diagnosis not present

## 2013-12-06 DIAGNOSIS — H269 Unspecified cataract: Secondary | ICD-10-CM | POA: Diagnosis not present

## 2014-01-06 DIAGNOSIS — M25569 Pain in unspecified knee: Secondary | ICD-10-CM | POA: Diagnosis not present

## 2014-01-07 DIAGNOSIS — M961 Postlaminectomy syndrome, not elsewhere classified: Secondary | ICD-10-CM | POA: Diagnosis not present

## 2014-01-13 ENCOUNTER — Other Ambulatory Visit: Payer: Self-pay | Admitting: Orthopedic Surgery

## 2014-01-13 DIAGNOSIS — M961 Postlaminectomy syndrome, not elsewhere classified: Secondary | ICD-10-CM

## 2014-01-19 ENCOUNTER — Ambulatory Visit
Admission: RE | Admit: 2014-01-19 | Discharge: 2014-01-19 | Disposition: A | Discharge status: Home / Self Care | Payer: Medicare Other | Source: Ambulatory Visit | Attending: Orthopedic Surgery | Admitting: Orthopedic Surgery

## 2014-01-19 VITALS — BP 157/79 | HR 72

## 2014-01-19 DIAGNOSIS — M5126 Other intervertebral disc displacement, lumbar region: Secondary | ICD-10-CM | POA: Diagnosis not present

## 2014-01-19 DIAGNOSIS — M961 Postlaminectomy syndrome, not elsewhere classified: Secondary | ICD-10-CM

## 2014-01-19 DIAGNOSIS — M4310 Spondylolisthesis, site unspecified: Secondary | ICD-10-CM | POA: Diagnosis not present

## 2014-01-19 DIAGNOSIS — M47817 Spondylosis without myelopathy or radiculopathy, lumbosacral region: Secondary | ICD-10-CM | POA: Diagnosis not present

## 2014-01-19 DIAGNOSIS — M5137 Other intervertebral disc degeneration, lumbosacral region: Secondary | ICD-10-CM | POA: Diagnosis not present

## 2014-01-19 DIAGNOSIS — M545 Low back pain, unspecified: Secondary | ICD-10-CM | POA: Diagnosis not present

## 2014-01-19 DIAGNOSIS — M48061 Spinal stenosis, lumbar region without neurogenic claudication: Secondary | ICD-10-CM | POA: Diagnosis not present

## 2014-01-19 MED ORDER — ONDANSETRON HCL 4 MG/2ML IJ SOLN
4.0000 mg | Freq: Once | INTRAMUSCULAR | Status: AC
  Administered 2014-01-19: 4 mg via INTRAMUSCULAR

## 2014-01-19 MED ORDER — IOHEXOL 180 MG/ML  SOLN
18.0000 mL | Freq: Once | INTRAMUSCULAR | Status: AC | PRN
  Administered 2014-01-19: 18 mL via INTRATHECAL

## 2014-01-19 MED ORDER — MEPERIDINE HCL 100 MG/ML IJ SOLN
75.0000 mg | Freq: Once | INTRAMUSCULAR | Status: AC
  Administered 2014-01-19: 75 mg via INTRAMUSCULAR

## 2014-01-19 MED ORDER — DIAZEPAM 5 MG PO TABS
5.0000 mg | ORAL_TABLET | Freq: Once | ORAL | Status: AC
  Administered 2014-01-19: 5 mg via ORAL

## 2014-01-19 NOTE — Discharge Instructions (Signed)
Myelogram Discharge Instructions  1. Go home and rest quietly for the next 24 hours.  It is important to lie flat for the next 24 hours.  Get up only to go to the restroom.  You may lie in the bed or on a couch on your back, your stomach, your left side or your right side.  You may have one pillow under your head.  You may have pillows between your knees while you are on your side or under your knees while you are on your back.  2. DO NOT drive today.  Recline the seat as far back as it will go, while still wearing your seat belt, on the way home.  3. You may get up to go to the bathroom as needed.  You may sit up for 10 minutes to eat.  You may resume your normal diet and medications unless otherwise indicated.  Drink lots of extra fluids today and tomorrow.  4. The incidence of headache, nausea, or vomiting is about 5% (one in 20 patients).  If you develop a headache, lie flat and drink plenty of fluids until the headache goes away.  Caffeinated beverages may be helpful.  If you develop severe nausea and vomiting or a headache that does not go away with flat bed rest, call [phone].  5. You may resume normal activities after your 24 hours of bed rest is over; however, do not exert yourself strongly or do any heavy lifting tomorrow. If when you get up you have a headache when standing, go back to bed and force fluids for another 24 hours.  6. Call your physician for a follow-up appointment.  The results of your myelogram will be sent directly to your physician by the following day.  7. If you have any questions or if complications develop after you arrive home, please call [phone].  Discharge instructions have been explained to the patient.  The patient, or the person responsible for the patient, fully understands these instructions.      May resume Trazodone, Tramadol and Cymbalta on Sept. 24, 2015, after 8:00 am.

## 2014-01-19 NOTE — Progress Notes (Signed)
Pt states she has been off cymbalta, tramadol and trazodone for the past 2 days. Discharge instructions explained to pt.

## 2014-01-21 DIAGNOSIS — G894 Chronic pain syndrome: Secondary | ICD-10-CM | POA: Diagnosis not present

## 2014-01-21 DIAGNOSIS — M961 Postlaminectomy syndrome, not elsewhere classified: Secondary | ICD-10-CM | POA: Diagnosis not present

## 2014-01-31 DIAGNOSIS — M961 Postlaminectomy syndrome, not elsewhere classified: Secondary | ICD-10-CM | POA: Diagnosis not present

## 2014-01-31 DIAGNOSIS — Z981 Arthrodesis status: Secondary | ICD-10-CM | POA: Diagnosis not present

## 2014-02-08 DIAGNOSIS — Z23 Encounter for immunization: Secondary | ICD-10-CM | POA: Diagnosis not present

## 2014-02-08 DIAGNOSIS — E780 Pure hypercholesterolemia: Secondary | ICD-10-CM | POA: Diagnosis not present

## 2014-02-08 DIAGNOSIS — M545 Low back pain: Secondary | ICD-10-CM | POA: Diagnosis not present

## 2014-02-08 DIAGNOSIS — I10 Essential (primary) hypertension: Secondary | ICD-10-CM | POA: Diagnosis not present

## 2014-02-08 DIAGNOSIS — F329 Major depressive disorder, single episode, unspecified: Secondary | ICD-10-CM | POA: Diagnosis not present

## 2014-02-10 DIAGNOSIS — M546 Pain in thoracic spine: Secondary | ICD-10-CM | POA: Diagnosis not present

## 2014-02-11 ENCOUNTER — Other Ambulatory Visit: Payer: Self-pay

## 2014-02-15 ENCOUNTER — Encounter (HOSPITAL_COMMUNITY): Payer: Self-pay | Admitting: Pharmacy Technician

## 2014-02-17 ENCOUNTER — Inpatient Hospital Stay (HOSPITAL_COMMUNITY)
Admission: RE | Admit: 2014-02-17 | Discharge: 2014-02-17 | Disposition: A | Discharge status: Home / Self Care | Payer: Medicare Other | Source: Ambulatory Visit

## 2014-02-17 ENCOUNTER — Encounter (HOSPITAL_COMMUNITY): Payer: Self-pay

## 2014-02-17 NOTE — Progress Notes (Signed)
Made Dr Rolena Infante office aware that orders were needed in Epic for patient's PAT appointment at 1500 PM

## 2014-02-17 NOTE — Pre-Procedure Instructions (Signed)
Kimberly Larsen  02/17/2014   Your procedure is scheduled on: Wednesday, February 23, 2014  Report to [facility] Admitting at 6;30 AM.  Call this number if you have problems the morning of surgery: [phone]   Remember:   Do not eat food or drink liquids after midnight Tuesday, February 22, 2014   Take these medicines the morning of surgery with A SIP OF WATER: DULoxetine (CYMBALTA), gabapentin (NEURONTIN), loratadine (CLARITIN), Oxycodone   Stop taking Aspirin, vitamins, and herbal medications ( Omega-3 Fatty Acids ([place])  Do not take any NSAIDs ie: Ibuprofen, Advil, Naproxen or any medication containing Aspirin; stop 5 days prior to procedure ( Friday, February 18, 2014)   Do not wear jewelry, make-up or nail polish.  Do not wear lotions, powders, or perfumes. You may not wear deodorant.  Do not shave 48 hours prior to surgery.   Do not bring valuables to the hospital.  [facility] is not responsible for any belongings or valuables.               Contacts, dentures or bridgework may not be worn into surgery.  Leave suitcase in the car. After surgery it may be brought to your room.  For patients admitted to the hospital, discharge time is determined by your treatment team.               Patients discharged the day of surgery will not be allowed to drive home.  Name and phone number of your driver:   Special Instructions:  Special Instructions:Special Instructions: [facility] - Preparing for Surgery  Before surgery, you can play an important role.  Because skin is not sterile, your skin needs to be as free of germs as possible.  You can reduce the number of germs on you skin by washing with CHG (chlorahexidine gluconate) soap before surgery.  CHG is an antiseptic cleaner which kills germs and bonds with the skin to continue killing germs even after washing.  Please DO NOT use if you have an allergy to CHG or antibacterial soaps.  If your skin becomes reddened/irritated  stop using the CHG and inform your nurse when you arrive at Short Stay.  Do not shave (including legs and underarms) for at least 48 hours prior to the first CHG shower.  You may shave your face.  Please follow these instructions carefully:   1.  Shower with CHG Soap the night before surgery and the morning of Surgery.  2.  If you choose to wash your hair, wash your hair first as usual with your normal shampoo.  3.  After you shampoo, rinse your hair and body thoroughly to remove the Shampoo.  4.  Use CHG as you would any other liquid soap.  You can apply chg directly  to the skin and wash gently with scrungie or a clean washcloth.  5.  Apply the CHG Soap to your body ONLY FROM THE NECK DOWN.  Do not use on open wounds or open sores.  Avoid contact with your eyes, ears, mouth and genitals (private parts).  Wash genitals (private parts) with your normal soap.  6.  Wash thoroughly, paying special attention to the area where your surgery will be performed.  7.  Thoroughly rinse your body with warm water from the neck down.  8.  DO NOT shower/wash with your normal soap after using and rinsing off the CHG Soap.  9.  Pat yourself dry with a clean towel.  10.  Wear clean pajamas.            11.  Place clean sheets on your bed the night of your first shower and do not sleep with pets.  Day of Surgery  Do not apply any lotions/deodorants the morning of surgery.  Please wear clean clothes to the hospital/surgery center.   Please read over the following fact sheets that you were given: Pain Booklet, Coughing and Deep Breathing, MRSA Information and Surgical Site Infection Prevention

## 2014-02-18 NOTE — H&P (Signed)
Kimberly Larsen is an 68 y.o. female.   History of Present Illness  The patient is a 68 year old female who comes in today for a preoperative history and physical. The patient is scheduled for a spinal cord stim placement to be performed by Dr. Duane Lope D. Rolena Infante, MD at [facility] on 02-23-14 . Please see the hospital record for complete dictated history and physical. Allergies [name]; 02/18/2014 8:37 AM) No Known Drug Allergies 02/25/2011  Social History  Previously in rehab no Pain Contract no Number of flights of stairs before winded greater than 5 Exercise Exercises weekly; does individual sport Tobacco use Never smoker. never smoker Tobacco / smoke exposure no Most recent primary occupation Web designer for [employer] (Currently) no Illicit drug use no Marital status divorced Living situation live alone Current work status working full time Children 1 Alcohol use never consumed alcohol  Medication History  Neurontin (600MG  Tablet, 1 po tid (2 tabs at bedtime) Oral) Active. Quinapril Active. Pravastatin Sodium (40MG  Tablet, qd Oral) Active. (qd) Trazodone Active. Cymbalta (60MG  Capsule DR Part, Oral) Active. (qd) Tramadol (50 mg) Active. (prn) Percocet (10-325MG  Tablet, 1 (one) Tablet Oral PO TID prn pain, Taken starting 02/09/2014) Active. Zinc Active. (qd) Melatonin (qd) Specific dose unknown - Active. Vitamin C (Oral) Specific dose unknown - Active. Fish Oil Concentrate (435MG  Capsule, 1 Oral) Active. Multivitamin Active. Biotin Active. (qd) Melatonin Active. (qhs) TraZODone HCl (Oral) Specific dose unknown - Active. Skelaxin Active. Aspirin EC (81MG  Tablet DR, qd Oral) Active. Zinc 15 (Oral as needed) Specific dose unknown - Active. (winter time only) Super B Complex (bid Oral) Active. (qd) Quinapril-Hydrochlorothiazide (20-12.5MG  Tablet, 1 po bid Oral) Active. (bid) Calcium  Citrate (500MG  Capsule, 1 Oral) Active. (bid) Multiple Vitamin (1 Oral) Active. (qd) Medications Reconciled  Past Surgical History  Other Surgery Tubal Ligation, Lithotripsy x 2 Tonsillectomy Tubal Ligation Back Surgery TLIF L3-4 with decompression L4-5 Carpal Tunnel Repair right Straighten Nasal Septum  Other Problems  Anxiety Disorder Depression Gastroesophageal Reflux Disease High blood pressure Hypercholesterolemia Kidney Stone    Vitals  02/18/2014 8:38 AM Weight: 202 lb Height: 62in Body Surface Area: 2 m Body Mass Index: 36.95 kg/m Temp.: 97.4F(Oral)      Assessment & Plan      Current Plans Follow up in 2 weeks  Past Medical History  Diagnosis Date  . Hypertension   . Arthritis   . HLD (hyperlipidemia)   . History of kidney stones   . Chronic kidney disease     Past Surgical History  Procedure Laterality Date  . Tubal ligation    . Ganglion cyst excision      L ankle  . Rhinoplasty    . Lithotripsy    . Carpal tunnel release      R hand  . Back surgery  2013  . Total knee arthroplasty Right 08/31/2012    Procedure: RIGHT TOTAL KNEE ARTHROPLASTY;  Surgeon: Mauri Pole, MD;  Location: [facility];  Service: Orthopedics;  Laterality: Right;  with LMA    No family history on file. Social History:  reports that she has never smoked. She does not have any smokeless tobacco history on file. She reports that she does not drink alcohol or use illicit drugs.  Allergies: No Known Allergies  No prescriptions prior to admission    No results found for this or any previous visit (from the past 48 hour(s)). No results found.  Review of Systems  Constitutional: Negative.   HENT: Negative.   Eyes: Negative.   Respiratory: Negative.   Cardiovascular: Negative.   Gastrointestinal: Negative.   Genitourinary: Negative.   Musculoskeletal: Positive for back pain.  Skin: Negative.   Endo/Heme/Allergies: Negative.     There  were no vitals taken for this visit. Physical Exam  Constitutional: She is oriented to person, place, and time. She appears well-developed and well-nourished.  HENT:  Head: Normocephalic and atraumatic.  Eyes: EOM are normal. Pupils are equal, round, and reactive to light.  Neck: Normal range of motion.  Cardiovascular: Normal rate and regular rhythm.   Respiratory: Effort normal and breath sounds normal.  GI: Soft. Bowel sounds are normal.  Musculoskeletal: She exhibits tenderness.  Neurological: She is alert and oriented to person, place, and time.  Skin: Skin is warm and dry.  Psychiatric: She has a normal mood and affect.     Assessment/Plan Chronic back pain and leg pain.  Proceed with spinal cord stimulator placement as scheduled.  Surgical procedure discussed along with possible risks and complications.  All questions answered.    Pammie Chirino M 02/18/2014, 3:00 PM

## 2014-02-21 ENCOUNTER — Encounter (HOSPITAL_COMMUNITY): Payer: Self-pay

## 2014-02-21 ENCOUNTER — Encounter (HOSPITAL_COMMUNITY)
Admission: RE | Admit: 2014-02-21 | Discharge: 2014-02-21 | Disposition: A | Discharge status: Home / Self Care | Payer: Medicare Other | Source: Ambulatory Visit | Attending: Orthopedic Surgery | Admitting: Orthopedic Surgery

## 2014-02-21 ENCOUNTER — Encounter (HOSPITAL_COMMUNITY)
Admission: RE | Admit: 2014-02-21 | Discharge: 2014-02-21 | Disposition: A | Discharge status: Home / Self Care | Payer: Medicare Other | Source: Ambulatory Visit | Attending: Surgery | Admitting: Surgery

## 2014-02-21 DIAGNOSIS — N189 Chronic kidney disease, unspecified: Secondary | ICD-10-CM | POA: Diagnosis not present

## 2014-02-21 DIAGNOSIS — M79604 Pain in right leg: Secondary | ICD-10-CM | POA: Diagnosis not present

## 2014-02-21 DIAGNOSIS — D649 Anemia, unspecified: Secondary | ICD-10-CM | POA: Diagnosis not present

## 2014-02-21 DIAGNOSIS — I129 Hypertensive chronic kidney disease with stage 1 through stage 4 chronic kidney disease, or unspecified chronic kidney disease: Secondary | ICD-10-CM | POA: Diagnosis not present

## 2014-02-21 DIAGNOSIS — J984 Other disorders of lung: Secondary | ICD-10-CM | POA: Diagnosis not present

## 2014-02-21 DIAGNOSIS — F419 Anxiety disorder, unspecified: Secondary | ICD-10-CM | POA: Diagnosis not present

## 2014-02-21 DIAGNOSIS — Z01818 Encounter for other preprocedural examination: Secondary | ICD-10-CM | POA: Diagnosis not present

## 2014-02-21 DIAGNOSIS — Z7982 Long term (current) use of aspirin: Secondary | ICD-10-CM | POA: Diagnosis not present

## 2014-02-21 DIAGNOSIS — Z79899 Other long term (current) drug therapy: Secondary | ICD-10-CM | POA: Diagnosis not present

## 2014-02-21 DIAGNOSIS — M797 Fibromyalgia: Secondary | ICD-10-CM | POA: Diagnosis not present

## 2014-02-21 DIAGNOSIS — G8929 Other chronic pain: Secondary | ICD-10-CM | POA: Diagnosis not present

## 2014-02-21 DIAGNOSIS — M549 Dorsalgia, unspecified: Secondary | ICD-10-CM | POA: Diagnosis not present

## 2014-02-21 DIAGNOSIS — M79605 Pain in left leg: Secondary | ICD-10-CM | POA: Diagnosis not present

## 2014-02-21 DIAGNOSIS — E785 Hyperlipidemia, unspecified: Secondary | ICD-10-CM | POA: Diagnosis not present

## 2014-02-21 HISTORY — DX: Anxiety disorder, unspecified: F41.9

## 2014-02-21 LAB — COMPREHENSIVE METABOLIC PANEL
ALT: 16 U/L (ref 0–35)
AST: 19 U/L (ref 0–37)
Albumin: 3.6 g/dL (ref 3.5–5.2)
Alkaline Phosphatase: 107 U/L (ref 39–117)
Anion gap: 13 (ref 5–15)
BUN: 21 mg/dL (ref 6–23)
CO2: 28 mEq/L (ref 19–32)
Calcium: 9.7 mg/dL (ref 8.4–10.5)
Chloride: 100 mEq/L (ref 96–112)
Creatinine, Ser: 0.86 mg/dL (ref 0.50–1.10)
GFR calc Af Amer: 80 mL/min — ABNORMAL LOW (ref >90)
GFR calc non Af Amer: 69 mL/min — ABNORMAL LOW (ref >90)
Glucose, Bld: 101 mg/dL — ABNORMAL HIGH (ref 70–99)
Potassium: 3.9 mEq/L (ref 3.7–5.3)
Sodium: 141 mEq/L (ref 137–147)
Total Bilirubin: 0.4 mg/dL (ref 0.3–1.2)
Total Protein: 7.2 g/dL (ref 6.0–8.3)

## 2014-02-21 LAB — URINALYSIS, ROUTINE W REFLEX MICROSCOPIC
Bilirubin Urine: NEGATIVE
Glucose, UA: NEGATIVE mg/dL
Ketones, ur: NEGATIVE mg/dL
Nitrite: NEGATIVE
Protein, ur: NEGATIVE mg/dL
Specific Gravity, Urine: 1.016 (ref 1.005–1.030)
Urobilinogen, UA: 0.2 mg/dL (ref 0.0–1.0)
pH: 6 (ref 5.0–8.0)

## 2014-02-21 LAB — URINE MICROSCOPIC-ADD ON

## 2014-02-21 LAB — SURGICAL PCR SCREEN
MRSA, PCR: NEGATIVE
Staphylococcus aureus: NEGATIVE

## 2014-02-21 LAB — CBC
HCT: 38.9 % (ref 36.0–46.0)
Hemoglobin: 12.4 g/dL (ref 12.0–15.0)
MCH: 28.7 pg (ref 26.0–34.0)
MCHC: 31.9 g/dL (ref 30.0–36.0)
MCV: 90 fL (ref 78.0–100.0)
Platelets: 301 10*3/uL (ref 150–400)
RBC: 4.32 MIL/uL (ref 3.87–5.11)
RDW: 14 % (ref 11.5–15.5)
WBC: 9.4 10*3/uL (ref 4.0–10.5)

## 2014-02-21 LAB — APTT: aPTT: 28 seconds (ref 24–37)

## 2014-02-21 LAB — PROTIME-INR
INR: 0.94 (ref 0.00–1.49)
Prothrombin Time: 12.7 seconds (ref 11.6–15.2)

## 2014-02-22 MED ORDER — ACETAMINOPHEN 10 MG/ML IV SOLN
1000.0000 mg | INTRAVENOUS | Status: AC
  Administered 2014-02-23: 1000 mg via INTRAVENOUS

## 2014-02-22 MED ORDER — DEXAMETHASONE SODIUM PHOSPHATE 4 MG/ML IJ SOLN
4.0000 mg | INTRAMUSCULAR | Status: AC
  Administered 2014-02-23: 4 mg via INTRAVENOUS
  Filled 2014-02-22: qty 1

## 2014-02-22 MED ORDER — CEFAZOLIN SODIUM-DEXTROSE 2-3 GM-% IV SOLR
2.0000 g | INTRAVENOUS | Status: AC
  Administered 2014-02-23: 2 g via INTRAVENOUS
  Filled 2014-02-22: qty 50

## 2014-02-22 NOTE — Progress Notes (Signed)
Anesthesia Chart Review:  Patient is a 68 year old female scheduled for spinal cord stimulator placement on 02/23/14 by Dr. Rolena Infante.    History includes non-smoker, HLD, HTN, CKD (not specified), nephrolithiasis, anxiety, L3-4 PLIF '13, rhinoplasty, right TKA '14.  BMI is consistent with obesity.  PCP is listed as Dr. Greig Right.    EKG on 02/21/14 showed NSR, cannot rule out anterior infarct (age undetermined). EKG confirmed by cardiologist Dr. Claiborne Billings who added that WPW delta waves are no longer present when compared to prior EKG on 08/26/12 (which was not felt significantly changed from prior EKG on 07/18/05). (I reviewed some 2013 and 2014 PCP notes scanned under Media tab that were clearance notes prior to her PLIF and TKA that indicated that she does not have WPW and saw cardiology in the past, although I don't have these cardiology records to review.)   Preoperative CXR and labs note.    Patient will be further evaluated by her assigned anesthesiologist on arrival.  If no acute changes or new CV symptoms then I would anticipate that she could proceed with this procedure.  George [facility]/Anesthesiology Phone [phone] 02/22/2014 10:12 AM

## 2014-02-23 ENCOUNTER — Encounter (HOSPITAL_COMMUNITY)
Admission: RE | Disposition: A | Discharge status: Home / Self Care | Payer: Self-pay | Source: Ambulatory Visit | Attending: Orthopedic Surgery

## 2014-02-23 ENCOUNTER — Ambulatory Visit (HOSPITAL_COMMUNITY): Payer: Medicare Other | Admitting: Anesthesiology

## 2014-02-23 ENCOUNTER — Encounter (HOSPITAL_COMMUNITY): Payer: Self-pay | Admitting: Anesthesiology

## 2014-02-23 ENCOUNTER — Ambulatory Visit (HOSPITAL_COMMUNITY): Payer: Medicare Other

## 2014-02-23 ENCOUNTER — Encounter (HOSPITAL_COMMUNITY): Payer: Medicare Other | Admitting: Vascular Surgery

## 2014-02-23 ENCOUNTER — Observation Stay (HOSPITAL_COMMUNITY)
Admission: RE | Admit: 2014-02-23 | Discharge: 2014-02-24 | Disposition: A | Discharge status: Home / Self Care | Payer: Medicare Other | Source: Ambulatory Visit | Attending: Orthopedic Surgery | Admitting: Orthopedic Surgery

## 2014-02-23 DIAGNOSIS — G8929 Other chronic pain: Secondary | ICD-10-CM | POA: Diagnosis not present

## 2014-02-23 DIAGNOSIS — G894 Chronic pain syndrome: Secondary | ICD-10-CM

## 2014-02-23 DIAGNOSIS — E785 Hyperlipidemia, unspecified: Secondary | ICD-10-CM | POA: Insufficient documentation

## 2014-02-23 DIAGNOSIS — N189 Chronic kidney disease, unspecified: Secondary | ICD-10-CM | POA: Insufficient documentation

## 2014-02-23 DIAGNOSIS — M549 Dorsalgia, unspecified: Secondary | ICD-10-CM | POA: Diagnosis not present

## 2014-02-23 DIAGNOSIS — M797 Fibromyalgia: Secondary | ICD-10-CM | POA: Insufficient documentation

## 2014-02-23 DIAGNOSIS — Z79899 Other long term (current) drug therapy: Secondary | ICD-10-CM | POA: Insufficient documentation

## 2014-02-23 DIAGNOSIS — Z7982 Long term (current) use of aspirin: Secondary | ICD-10-CM | POA: Insufficient documentation

## 2014-02-23 DIAGNOSIS — M546 Pain in thoracic spine: Secondary | ICD-10-CM | POA: Diagnosis not present

## 2014-02-23 DIAGNOSIS — M79604 Pain in right leg: Secondary | ICD-10-CM | POA: Insufficient documentation

## 2014-02-23 DIAGNOSIS — I129 Hypertensive chronic kidney disease with stage 1 through stage 4 chronic kidney disease, or unspecified chronic kidney disease: Secondary | ICD-10-CM | POA: Insufficient documentation

## 2014-02-23 DIAGNOSIS — Z419 Encounter for procedure for purposes other than remedying health state, unspecified: Secondary | ICD-10-CM

## 2014-02-23 DIAGNOSIS — D649 Anemia, unspecified: Secondary | ICD-10-CM | POA: Insufficient documentation

## 2014-02-23 DIAGNOSIS — M79605 Pain in left leg: Secondary | ICD-10-CM | POA: Insufficient documentation

## 2014-02-23 DIAGNOSIS — M545 Low back pain: Secondary | ICD-10-CM | POA: Diagnosis not present

## 2014-02-23 DIAGNOSIS — F419 Anxiety disorder, unspecified: Secondary | ICD-10-CM | POA: Insufficient documentation

## 2014-02-23 HISTORY — PX: SPINAL CORD STIMULATOR INSERTION: SHX5378

## 2014-02-23 HISTORY — DX: Chronic pain syndrome: G89.4

## 2014-02-23 SURGERY — INSERTION, SPINAL CORD STIMULATOR, LUMBAR
Anesthesia: General | Site: Back

## 2014-02-23 MED ORDER — OXYCODONE HCL 5 MG PO TABS
ORAL_TABLET | ORAL | Status: AC
  Administered 2014-02-23: 5 mg
  Filled 2014-02-23: qty 1

## 2014-02-23 MED ORDER — MIDAZOLAM HCL 5 MG/5ML IJ SOLN
INTRAMUSCULAR | Status: DC | PRN
  Administered 2014-02-23: 2 mg via INTRAVENOUS

## 2014-02-23 MED ORDER — DULOXETINE HCL 30 MG PO CPEP
30.0000 mg | ORAL_CAPSULE | Freq: Every day | ORAL | Status: DC
  Filled 2014-02-23: qty 1

## 2014-02-23 MED ORDER — LACTATED RINGERS IV SOLN
INTRAVENOUS | Status: DC | PRN
  Administered 2014-02-23 (×2): via INTRAVENOUS

## 2014-02-23 MED ORDER — MINERAL OIL LIGHT 100 % EX OIL
TOPICAL_OIL | CUTANEOUS | Status: AC
  Filled 2014-02-23: qty 25

## 2014-02-23 MED ORDER — LISINOPRIL 20 MG PO TABS
20.0000 mg | ORAL_TABLET | Freq: Two times a day (BID) | ORAL | Status: DC
  Administered 2014-02-23: 20 mg via ORAL
  Filled 2014-02-23 (×3): qty 1

## 2014-02-23 MED ORDER — EPHEDRINE SULFATE 50 MG/ML IJ SOLN
INTRAMUSCULAR | Status: AC
  Filled 2014-02-23: qty 1

## 2014-02-23 MED ORDER — PROPOFOL 10 MG/ML IV BOLUS
INTRAVENOUS | Status: DC | PRN
  Administered 2014-02-23: 200 mg via INTRAVENOUS

## 2014-02-23 MED ORDER — FENTANYL CITRATE 0.05 MG/ML IJ SOLN
INTRAMUSCULAR | Status: DC | PRN
  Administered 2014-02-23 (×3): 50 ug via INTRAVENOUS
  Administered 2014-02-23: 100 ug via INTRAVENOUS

## 2014-02-23 MED ORDER — QUINAPRIL-HYDROCHLOROTHIAZIDE 20-12.5 MG PO TABS: 1.0000 | ORAL_TABLET | Freq: Two times a day (BID) | ORAL | Status: DC

## 2014-02-23 MED ORDER — SODIUM CHLORIDE 0.9 % IJ SOLN
3.0000 mL | Freq: Two times a day (BID) | INTRAMUSCULAR | Status: DC
  Administered 2014-02-23: 3 mL via INTRAVENOUS

## 2014-02-23 MED ORDER — OXYCODONE-ACETAMINOPHEN 5-325 MG PO TABS: 1.0000 | ORAL_TABLET | Freq: Once | ORAL | Status: DC

## 2014-02-23 MED ORDER — 0.9 % SODIUM CHLORIDE (POUR BTL) OPTIME
TOPICAL | Status: DC | PRN
  Administered 2014-02-23: 1000 mL

## 2014-02-23 MED ORDER — HYDROMORPHONE HCL 1 MG/ML IJ SOLN
INTRAMUSCULAR | Status: AC
  Administered 2014-02-23: 0.5 mg via INTRAVENOUS
  Filled 2014-02-23: qty 1

## 2014-02-23 MED ORDER — PHENOL 1.4 % MT LIQD: 1.0000 | OROMUCOSAL | Status: DC | PRN

## 2014-02-23 MED ORDER — METHOCARBAMOL 500 MG PO TABS
ORAL_TABLET | ORAL | Status: AC
  Filled 2014-02-23: qty 1

## 2014-02-23 MED ORDER — BUPIVACAINE-EPINEPHRINE (PF) 0.25% -1:200000 IJ SOLN
INTRAMUSCULAR | Status: AC
  Filled 2014-02-23: qty 30

## 2014-02-23 MED ORDER — HYDROMORPHONE HCL 1 MG/ML IJ SOLN
INTRAMUSCULAR | Status: AC
  Filled 2014-02-23: qty 1

## 2014-02-23 MED ORDER — FENTANYL CITRATE 0.05 MG/ML IJ SOLN
INTRAMUSCULAR | Status: AC
  Filled 2014-02-23: qty 5

## 2014-02-23 MED ORDER — SODIUM CHLORIDE 0.9 % IJ SOLN
INTRAMUSCULAR | Status: AC
  Filled 2014-02-23: qty 10

## 2014-02-23 MED ORDER — LACTATED RINGERS IV SOLN: INTRAVENOUS | Status: DC

## 2014-02-23 MED ORDER — THROMBIN 20000 UNITS EX SOLR
CUTANEOUS | Status: DC | PRN
  Administered 2014-02-23: 09:00:00 via TOPICAL

## 2014-02-23 MED ORDER — DEXTROSE 5 % IV SOLN: 500.0000 mg | Freq: Four times a day (QID) | INTRAVENOUS | Status: DC | PRN

## 2014-02-23 MED ORDER — HEMOSTATIC AGENTS (NO CHARGE) OPTIME
TOPICAL | Status: DC | PRN
  Administered 2014-02-23: 1 via TOPICAL

## 2014-02-23 MED ORDER — MENTHOL 3 MG MT LOZG
1.0000 | LOZENGE | OROMUCOSAL | Status: DC | PRN
  Filled 2014-02-23: qty 9

## 2014-02-23 MED ORDER — TRAZODONE 25 MG HALF TABLET
25.0000 mg | ORAL_TABLET | Freq: Every day | ORAL | Status: DC
  Administered 2014-02-23: 50 mg via ORAL
  Filled 2014-02-23 (×2): qty 2

## 2014-02-23 MED ORDER — PRAVASTATIN SODIUM 40 MG PO TABS
40.0000 mg | ORAL_TABLET | Freq: Every day | ORAL | Status: DC
  Administered 2014-02-23: 40 mg via ORAL
  Filled 2014-02-23 (×2): qty 1

## 2014-02-23 MED ORDER — GLYCOPYRROLATE 0.2 MG/ML IJ SOLN
INTRAMUSCULAR | Status: AC
  Filled 2014-02-23: qty 2

## 2014-02-23 MED ORDER — NEOSTIGMINE METHYLSULFATE 10 MG/10ML IV SOLN
INTRAVENOUS | Status: DC | PRN
  Administered 2014-02-23: 3 mg via INTRAVENOUS

## 2014-02-23 MED ORDER — MIDAZOLAM HCL 2 MG/2ML IJ SOLN
INTRAMUSCULAR | Status: AC
  Filled 2014-02-23: qty 2

## 2014-02-23 MED ORDER — ALBUMIN HUMAN 5 % IV SOLN
INTRAVENOUS | Status: DC | PRN
  Administered 2014-02-23: 10:00:00 via INTRAVENOUS

## 2014-02-23 MED ORDER — ONDANSETRON HCL 4 MG/2ML IJ SOLN
INTRAMUSCULAR | Status: DC | PRN
  Administered 2014-02-23: 4 mg via INTRAVENOUS

## 2014-02-23 MED ORDER — ACETAMINOPHEN 10 MG/ML IV SOLN
INTRAVENOUS | Status: AC
  Filled 2014-02-23: qty 100

## 2014-02-23 MED ORDER — DEXAMETHASONE SODIUM PHOSPHATE 4 MG/ML IJ SOLN
4.0000 mg | Freq: Four times a day (QID) | INTRAMUSCULAR | Status: DC
  Filled 2014-02-23 (×4): qty 1

## 2014-02-23 MED ORDER — OXYCODONE HCL 5 MG PO TABS
10.0000 mg | ORAL_TABLET | ORAL | Status: DC | PRN
  Administered 2014-02-23 (×2): 10 mg via ORAL
  Filled 2014-02-23 (×2): qty 2

## 2014-02-23 MED ORDER — MORPHINE SULFATE 2 MG/ML IJ SOLN: 1.0000 mg | INTRAMUSCULAR | Status: DC | PRN

## 2014-02-23 MED ORDER — SODIUM CHLORIDE 0.9 % IJ SOLN: 3.0000 mL | INTRAMUSCULAR | Status: DC | PRN

## 2014-02-23 MED ORDER — DEXAMETHASONE 4 MG PO TABS
4.0000 mg | ORAL_TABLET | Freq: Four times a day (QID) | ORAL | Status: DC
  Administered 2014-02-23 – 2014-02-24 (×3): 4 mg via ORAL
  Filled 2014-02-23 (×6): qty 1

## 2014-02-23 MED ORDER — METHOCARBAMOL 500 MG PO TABS
500.0000 mg | ORAL_TABLET | Freq: Four times a day (QID) | ORAL | Status: DC | PRN
  Administered 2014-02-23: 500 mg via ORAL

## 2014-02-23 MED ORDER — EPHEDRINE SULFATE 50 MG/ML IJ SOLN
INTRAMUSCULAR | Status: DC | PRN
  Administered 2014-02-23 (×3): 10 mg via INTRAVENOUS

## 2014-02-23 MED ORDER — BUPIVACAINE-EPINEPHRINE 0.25% -1:200000 IJ SOLN
INTRAMUSCULAR | Status: DC | PRN
  Administered 2014-02-23: 10 mL

## 2014-02-23 MED ORDER — GLYCOPYRROLATE 0.2 MG/ML IJ SOLN
INTRAMUSCULAR | Status: DC | PRN
Start: 2014-02-23 — End: 2014-02-23
  Administered 2014-02-23: 0.4 mg via INTRAVENOUS

## 2014-02-23 MED ORDER — THROMBIN 20000 UNITS EX SOLR
CUTANEOUS | Status: AC
  Filled 2014-02-23: qty 20000

## 2014-02-23 MED ORDER — HYDROMORPHONE HCL 1 MG/ML IJ SOLN
0.2500 mg | INTRAMUSCULAR | Status: DC | PRN
  Administered 2014-02-23 (×3): 0.5 mg via INTRAVENOUS

## 2014-02-23 MED ORDER — ROCURONIUM BROMIDE 100 MG/10ML IV SOLN
INTRAVENOUS | Status: DC | PRN
  Administered 2014-02-23: 40 mg via INTRAVENOUS

## 2014-02-23 MED ORDER — LIDOCAINE HCL (CARDIAC) 20 MG/ML IV SOLN
INTRAVENOUS | Status: DC | PRN
  Administered 2014-02-23: 70 mg via INTRAVENOUS

## 2014-02-23 MED ORDER — HYDROCHLOROTHIAZIDE 12.5 MG PO CAPS
12.5000 mg | ORAL_CAPSULE | Freq: Two times a day (BID) | ORAL | Status: DC
  Administered 2014-02-23: 12.5 mg via ORAL
  Filled 2014-02-23 (×3): qty 1

## 2014-02-23 MED ORDER — GABAPENTIN 600 MG PO TABS
600.0000 mg | ORAL_TABLET | Freq: Three times a day (TID) | ORAL | Status: DC
  Administered 2014-02-23: 600 mg via ORAL
  Filled 2014-02-23 (×4): qty 1

## 2014-02-23 MED ORDER — PROPOFOL 10 MG/ML IV BOLUS
INTRAVENOUS | Status: AC
  Filled 2014-02-23: qty 20

## 2014-02-23 MED ORDER — ACETAMINOPHEN 10 MG/ML IV SOLN
1000.0000 mg | Freq: Four times a day (QID) | INTRAVENOUS | Status: DC
  Administered 2014-02-23 – 2014-02-24 (×3): 1000 mg via INTRAVENOUS
  Filled 2014-02-23 (×4): qty 100

## 2014-02-23 MED ORDER — CEFAZOLIN SODIUM 1-5 GM-% IV SOLN
1.0000 g | Freq: Three times a day (TID) | INTRAVENOUS | Status: AC
  Administered 2014-02-23 (×2): 1 g via INTRAVENOUS
  Filled 2014-02-23 (×2): qty 50

## 2014-02-23 MED ORDER — ONDANSETRON HCL 4 MG/2ML IJ SOLN: 4.0000 mg | INTRAMUSCULAR | Status: DC | PRN

## 2014-02-23 SURGICAL SUPPLY — 66 items
CANISTER SUCTION 2500CC (MISCELLANEOUS) ×2 IMPLANT
CLSR STERI-STRIP ANTIMIC 1/2X4 (GAUZE/BANDAGES/DRESSINGS) ×3 IMPLANT
CORDS BIPOLAR (ELECTRODE) ×2 IMPLANT
COVER PROBE W GEL 5X96 (DRAPES) IMPLANT
DRAPE C-ARM 42X72 X-RAY (DRAPES) ×2 IMPLANT
DRAPE C-ARMOR (DRAPES) ×1 IMPLANT
DRAPE INCISE IOBAN 85X60 (DRAPES) ×2 IMPLANT
DRAPE SURG 17X23 STRL (DRAPES) ×2 IMPLANT
DRAPE U-SHAPE 47X51 STRL (DRAPES) ×2 IMPLANT
DRSG MEPILEX BORDER 4X4 (GAUZE/BANDAGES/DRESSINGS) ×1 IMPLANT
DRSG MEPILEX BORDER 4X8 (GAUZE/BANDAGES/DRESSINGS) ×3 IMPLANT
DURAPREP 26ML APPLICATOR (WOUND CARE) ×2 IMPLANT
ELECT BLADE 4.0 EZ CLEAN MEGAD (MISCELLANEOUS) ×2
ELECT CAUTERY BLADE 6.4 (BLADE) ×2 IMPLANT
ELECT PENCIL ROCKER SW 15FT (MISCELLANEOUS) ×2 IMPLANT
ELECT REM PT RETURN 9FT ADLT (ELECTROSURGICAL) ×2
ELECTRODE BLDE 4.0 EZ CLN MEGD (MISCELLANEOUS) IMPLANT
ELECTRODE REM PT RTRN 9FT ADLT (ELECTROSURGICAL) ×1 IMPLANT
GLOVE BIOGEL PI IND STRL 8 (GLOVE) ×1 IMPLANT
GLOVE BIOGEL PI IND STRL 8.5 (GLOVE) ×1 IMPLANT
GLOVE BIOGEL PI INDICATOR 8 (GLOVE) ×1
GLOVE BIOGEL PI INDICATOR 8.5 (GLOVE) ×1
GLOVE ECLIPSE 8.5 STRL (GLOVE) ×4 IMPLANT
GLOVE ORTHO TXT STRL SZ7.5 (GLOVE) ×2 IMPLANT
GOWN STRL REUS W/ TWL XL LVL3 (GOWN DISPOSABLE) ×2 IMPLANT
GOWN STRL REUS W/TWL 2XL LVL3 (GOWN DISPOSABLE) ×3 IMPLANT
GOWN STRL REUS W/TWL XL LVL3 (GOWN DISPOSABLE) ×4
IPG PROTEGE MRI (Neuro Prosthesis/Implant) ×1 IMPLANT
KIT BASIN OR (CUSTOM PROCEDURE TRAY) ×2 IMPLANT
KIT LEAD 60CM OCTRODE (Lead) ×1 IMPLANT
KIT ROOM TURNOVER OR (KITS) ×2 IMPLANT
LAMI NARROW PRIPOLE 16CH (Orthopedic Implant) ×1 IMPLANT
LEAD LAMITRODE 60CM 8CH (Stimulator) ×1 IMPLANT
LEAD LAMITRODE S-8 60CM (MISCELLANEOUS) ×1 IMPLANT
NDL SPNL 18GX3.5 QUINCKE PK (NEEDLE) ×3 IMPLANT
NDL SUT 6 .5 CRC .975X.05 MAYO (NEEDLE) ×1 IMPLANT
NEEDLE 22X1 1/2 (OR ONLY) (NEEDLE) ×2 IMPLANT
NEEDLE MAYO TAPER (NEEDLE) ×2
NEEDLE SPNL 18GX3.5 QUINCKE PK (NEEDLE) ×6 IMPLANT
NS IRRIG 1000ML POUR BTL (IV SOLUTION) ×3 IMPLANT
PACK LAMINECTOMY ORTHO (CUSTOM PROCEDURE TRAY) ×2 IMPLANT
PACK UNIVERSAL I (CUSTOM PROCEDURE TRAY) ×2 IMPLANT
PAD ARMBOARD 7.5X6 YLW CONV (MISCELLANEOUS) ×5 IMPLANT
PATIENT PROGRAMMER PROTEGE MRI (MISCELLANEOUS) ×1 IMPLANT
SPONGE LAP 4X18 X RAY DECT (DISPOSABLE) ×1 IMPLANT
SPONGE SURGIFOAM ABS GEL 100 (HEMOSTASIS) ×2 IMPLANT
STAPLER VISISTAT 35W (STAPLE) ×2 IMPLANT
SURGIFLO TRUKIT (HEMOSTASIS) ×1 IMPLANT
SUT BONE WAX W31G (SUTURE) ×2 IMPLANT
SUT FIBERWIRE #2 38 REV NDL BL (SUTURE) ×2
SUT MON AB 3-0 SH 27 (SUTURE) ×4
SUT MON AB 3-0 SH27 (SUTURE) ×2 IMPLANT
SUT VIC AB 1 CT1 18XCR BRD 8 (SUTURE) IMPLANT
SUT VIC AB 1 CT1 27 (SUTURE) ×6
SUT VIC AB 1 CT1 27XBRD ANBCTR (SUTURE) ×3 IMPLANT
SUT VIC AB 1 CT1 8-18 (SUTURE) ×2
SUT VIC AB 2-0 CT1 18 (SUTURE) ×2 IMPLANT
SUTURE FIBERWR#2 38 REV NDL BL (SUTURE) ×1 IMPLANT
SYR BULB IRRIGATION 50ML (SYRINGE) ×2 IMPLANT
SYR CONTROL 10ML LL (SYRINGE) ×2 IMPLANT
SYSTEM CHARGING PRODIGY (MISCELLANEOUS) ×1 IMPLANT
TOWEL OR 17X24 6PK STRL BLUE (TOWEL DISPOSABLE) ×2 IMPLANT
TOWEL OR 17X26 10 PK STRL BLUE (TOWEL DISPOSABLE) ×2 IMPLANT
TRAY FOLEY CATH 16FRSI W/METER (SET/KITS/TRAYS/PACK) IMPLANT
WATER STERILE IRR 1000ML POUR (IV SOLUTION) ×1 IMPLANT
YANKAUER SUCT BULB TIP NO VENT (SUCTIONS) ×1 IMPLANT

## 2014-02-23 NOTE — Brief Op Note (Signed)
02/23/2014  11:18 AM  PATIENT:  Kimberly Larsen  68 y.o. female  PRE-OPERATIVE DIAGNOSIS:  CHRONIC PAIN   POST-OPERATIVE DIAGNOSIS:  CHRONIC PAIN   PROCEDURE:  Procedure(s): SPINAL CORD STIMULATOR PLACEMENT  (N/A)  SURGEON:  Surgeon(s) and Role:    * Melina Schools, MD - Primary  PHYSICIAN ASSISTANT:   ASSISTANTS: Benjiman Core   ANESTHESIA:   general  EBL:  Total I/O In: 1250 [I.V.:1000; IV Piggyback:250] Out: 25 [Blood:25]  BLOOD ADMINISTERED:none  DRAINS: none   LOCAL MEDICATIONS USED:  MARCAINE     SPECIMEN:  No Specimen  DISPOSITION OF SPECIMEN:  N/A  COUNTS:  YES  TOURNIQUET:  * No tourniquets in log *  DICTATION: .Other Dictation: Dictation Number [id number]  PLAN OF CARE: Admit for overnight observation  PATIENT DISPOSITION:  PACU - hemodynamically stable.

## 2014-02-23 NOTE — H&P (Signed)
At this point in time we have gone over the risks and benefits of the spinal cord stimulator placement. The goal of surgery is to successfully implant the spinal cord stimulator, reduce her pain, and improve her quality of life. The risks of that include infection, bleeding, nerve damage, death, stoke, paralysis, migration of the lead, major bleeding, failure to heal, need for further surgery, hardware failure, rejection of the device, leak of spinal fluid, ongoing or worse pain, loss of bowel and bladder control, blood clots, adjacent segment disease. Al of the questions were addressed. We will plan on surgery in the near future, but first I will get that thoracic MRI to ensure that there is adequate room to implant the device. If there is then we will proceed with the surgery. If not then I will see her back to discuss other options.  Programing was maximized at T8 level.  Will plan on T9 laminotomy and placement of lead and then left sided battery placement.

## 2014-02-23 NOTE — Anesthesia Procedure Notes (Signed)
Procedure Name: Intubation Date/Time: 02/23/2014 8:32 AM Performed by: Kyung Rudd Pre-anesthesia Checklist: Patient identified, Emergency Drugs available, Suction available, Patient being monitored and Timeout performed Patient Re-evaluated:Patient Re-evaluated prior to inductionOxygen Delivery Method: Circle system utilized Preoxygenation: Pre-oxygenation with 100% oxygen Intubation Type: IV induction Ventilation: Mask ventilation without difficulty Laryngoscope Size: Mac and 3 Grade View: Grade I Tube type: Oral Tube size: 7.0 mm Number of attempts: 1 Airway Equipment and Method: Stylet Placement Confirmation: ETT inserted through vocal cords under direct vision,  positive ETCO2 and breath sounds checked- equal and bilateral Secured at: 22 cm Tube secured with: Tape Dental Injury: Teeth and Oropharynx as per pre-operative assessment

## 2014-02-23 NOTE — Transfer of Care (Signed)
Immediate Anesthesia Transfer of Care Note  Patient: Kimberly Larsen  Procedure(s) Performed: Procedure(s): SPINAL CORD STIMULATOR PLACEMENT  (N/A)  Patient Location: PACU  Anesthesia Type:General  Level of Consciousness: awake, alert  and oriented  Airway & Oxygen Therapy: Patient Spontanous Breathing and Patient connected to nasal cannula oxygen  Post-op Assessment: Report given to PACU RN, Post -op Vital signs reviewed and stable and Patient moving all extremities X 4  Post vital signs: Reviewed and stable  Complications: No apparent anesthesia complications

## 2014-02-23 NOTE — Anesthesia Postprocedure Evaluation (Signed)
  Anesthesia Post-op Note  Patient: Kimberly Larsen  Procedure(s) Performed: Procedure(s): SPINAL CORD STIMULATOR PLACEMENT  (N/A)  Patient Location: PACU  Anesthesia Type:General  Level of Consciousness: awake  Airway and Oxygen Therapy: Patient Spontanous Breathing  Post-op Pain: mild  Post-op Assessment: Post-op Vital signs reviewed  Post-op Vital Signs: Reviewed  Last Vitals:  Filed Vitals:   02/23/14 1200  BP: 126/59  Pulse: 81  Temp:   Resp: 11    Complications: No apparent anesthesia complications

## 2014-02-23 NOTE — Op Note (Signed)
NAME:  JAYDY, FITZHENRY NO.:  [id number]  MEDICAL RECORD NO.:  [id number]  LOCATION:  [id number]                        FACILITY:  [facility]  PHYSICIAN:  Dahlia Bailiff, MD    DATE OF BIRTH:  Aug 12, 1945  DATE OF PROCEDURE:  02/23/2014 DATE OF DISCHARGE:                              OPERATIVE REPORT   PREOPERATIVE DIAGNOSIS:  Failed back syndrome with chronic back, buttock, and radicular leg pain, left side worse than the right.  POSTOPERATIVE DIAGNOSIS:  Failed back syndrome with chronic back, buttock, and radicular leg pain, left side worse than the right.  OPERATIVE PROCEDURE:  Implantation of spinal cord stimulator.  SURGEON:  Dahlia Bailiff, MD  FIRST ASSISTANT:  Alyson Locket. Velora Heckler.  HISTORY:  This is a very pleasant woman who underwent C3-4 instrumentation in the past.  She continued to have significant radicular neuropathic leg pain, so we elected to proceed with permanent spinal cord stimulator after she had a successful trial.  All appropriate risks, benefits, and alternatives were discussed with the patient, and consent was obtained.  OPERATIVE REPORT:  The patient was brought to the operating room, placed supine on the operating table.  After successful induction of general anesthesia and endotracheal intubation, TEDs and SCDs were applied.  She was turned prone onto the Wilson frame, and all bony prominences were well padded.  Back was prepped and draped in a standard fashion. Preoperatively, I determined where the battery site was to be placed and this was also prepped out and draped.  A time-out was taken to confirm the patient, procedure, and all other pertinent important data.  Midline incision was made.  I then counted in the lateral plane from L5 up to T9.  Patient's programming was most effective at the T8 vertebral body level, so I elected to perform a T9 laminotomy.  I identified the T9 pedicle and then infiltrated the midline incision with  0.25% Marcaine with epinephrine.  I then made an incision starting at the inferior aspect of T9 proceeding down to the inferior aspect of T10.  Sharp dissection was carried out down to the deep fascia.  Deep fascia was sharply incised and then using a Cobb elevator and Bovie, I stripped the paraspinal muscles to expose the lamina of T9 and T10.  I identified the facet capsule as well.  At this point, I then counted again in the lateral plane using fluoroscopy to confirm the T9 lamina.  Once this was done, I removed the majority of the T9 spinous process.  I then used 2 mm Kerrison to perform a laminotomy of T9.  I then released the ligamentum flavum and resected this with a 1 mm and 2 mm Kerrison punch. I then used the trial dural elevator, and I was able to pass this superiorly without any tension or difficulty.  I obtained a standard Tripole lead and I was able to advance it.  However, in advancing it, it is felt just somewhat too tight.  As a result, I removed it and I elected to use a thinner smaller lead.  I obtained this single long lead, and this was easily advanced without the sense of  creating pressure up to the posterior aspect of the T7 vertebral body.  This was just in the midline.  Because I had now less leads, I also then took a percutaneous lead placement and placed it along the left side of the thecal sac.  This would allow for better coverage for her left leg pain. Once this was done, I then took final AP and lateral x-rays to confirm both of the leads were properly positioned.  Once I did this, I then secured them to the T10 spinous process directly with FiberWire.  Two bone holes were created.  I sutured them down with FiberWire.  I then made a left-sided gluteal incision and created a pocket 2.5 cm in depth. I then used the submuscular Passer, and I passed the wire from the thoracic into the gluteal incision.  I secured it to the battery and then secured the battery to  the deep fascia of the pocket with 2-0 FiberWire.  We then tested the entire system.  They were functioning fine according to the representative from [place].  At this point, with everything secured in place, I irrigated copiously with normal saline and took final x-rays.  Once I had hemostasis with bipolar electrocautery, I then placed thrombin-soaked Gelfoam patty over the exposed area with a laminotomy was created and then closed the deep fascia of both wounds with interrupted #1 Vicryl sutures, superficial was closed with 2-0 Vicryl sutures and a 3-0 Monocryl.  Steri-Strips and a dry dressing were applied.  The patient was then ultimately extubated and transferred to PACU without incident.  At the end of the case, all needle and sponge counts were correct.  There was no adverse intraoperative events.     Dahlia Bailiff, MD     DDB/MEDQ  D:  02/23/2014  T:  02/23/2014  Job:  [id number]

## 2014-02-23 NOTE — Anesthesia Preprocedure Evaluation (Addendum)
Anesthesia Evaluation  Patient identified by MRN, date of birth, ID band Patient awake    Reviewed: Allergy & Precautions, H&P , NPO status , Patient's Chart, lab work & pertinent test results  Airway Mallampati: II       Dental   Pulmonary  breath sounds clear to auscultation        Cardiovascular hypertension, Rhythm:Regular Rate:Normal     Neuro/Psych Anxiety  Neuromuscular disease    GI/Hepatic negative GI ROS, Neg liver ROS,   Endo/Other    Renal/GU Renal disease     Musculoskeletal  (+) Arthritis -, Fibromyalgia -  Abdominal   Peds  Hematology  (+) anemia ,   Anesthesia Other Findings   Reproductive/Obstetrics                            Anesthesia Physical Anesthesia Plan  ASA: III  Anesthesia Plan: General   Post-op Pain Management:    Induction: Intravenous  Airway Management Planned: Oral ETT  Additional Equipment:   Intra-op Plan:   Post-operative Plan: Extubation in OR  Informed Consent: I have reviewed the patients History and Physical, chart, labs and discussed the procedure including the risks, benefits and alternatives for the proposed anesthesia with the patient or authorized representative who has indicated his/her understanding and acceptance.   Dental advisory given  Plan Discussed with: CRNA, Anesthesiologist and Surgeon  Anesthesia Plan Comments:        Anesthesia Quick Evaluation

## 2014-02-24 DIAGNOSIS — G8929 Other chronic pain: Secondary | ICD-10-CM | POA: Diagnosis not present

## 2014-02-24 MED ORDER — ONDANSETRON HCL 4 MG PO TABS: 4.0000 mg | ORAL_TABLET | Freq: Three times a day (TID) | ORAL | Status: DC | PRN

## 2014-02-24 MED ORDER — OXYCODONE-ACETAMINOPHEN 10-325 MG PO TABS: 1.0000 | ORAL_TABLET | ORAL | Status: DC | PRN

## 2014-02-24 MED ORDER — METHOCARBAMOL 500 MG PO TABS: 500.0000 mg | ORAL_TABLET | Freq: Three times a day (TID) | ORAL | Status: DC | PRN

## 2014-02-24 MED ORDER — TRAZODONE HCL 50 MG PO TABS
50.0000 mg | ORAL_TABLET | Freq: Every day | ORAL | Status: DC
  Filled 2014-02-24: qty 1

## 2014-02-24 NOTE — Discharge Summary (Signed)
[name] MRN: [id number] DOB/AGE: 66-29-1947 66 y.o.  Admit date: 02/23/2014 Discharge date: 02/24/2014  Admission Diagnoses:  Active Problems:   Chronic pain   Discharge Diagnoses:  Active Problems:   Chronic pain  status post Procedure(s): SPINAL CORD STIMULATOR PLACEMENT   Past Medical History  Diagnosis Date  . Hypertension   . Arthritis   . HLD (hyperlipidemia)   . History of kidney stones   . Chronic kidney disease   . Anxiety     Surgeries: Procedure(s): SPINAL CORD STIMULATOR PLACEMENT  on 02/23/2014   Consultants:  none  Discharged Condition: Improved  Hospital Course: Kimberly Larsen is an 68 y.o. female who was admitted 02/23/2014 for operative treatment of <principal problem not specified>. Patient failed conservative treatments (please see the history and physical for the specifics) and had severe unremitting pain that affects sleep, daily activities and work/hobbies. After pre-op clearance, the patient was taken to the operating room on 02/23/2014 and underwent  Procedure(s): [place] .    Patient was given perioperative antibiotics: Anti-infectives   Start     Dose/Rate Route Frequency Ordered Stop   02/23/14 1600  ceFAZolin (ANCEF) IVPB 1 g/50 mL premix     1 g 100 mL/hr over 30 Minutes Intravenous Every 8 hours 02/23/14 1449 02/23/14 2340   02/22/14 1427  ceFAZolin (ANCEF) IVPB 2 g/50 mL premix     2 g 100 mL/hr over 30 Minutes Intravenous 30 min pre-op 02/22/14 1427 02/23/14 0855       Patient was given sequential compression devices and early ambulation to prevent DVT.   Patient benefited maximally from hospital stay and there were no complications. At the time of discharge, the patient was urinating/moving their bowels without difficulty, tolerating a regular diet, pain is controlled with oral pain medications and they have been cleared by PT/OT.   Recent vital signs: Patient Vitals for the past 24  hrs:  BP Temp Temp src Pulse Resp SpO2  02/24/14 0405 146/63 mmHg 98.7 F (37.1 C) Oral 87 18 96 %  02/24/14 0001 121/55 mmHg 98.4 F (36.9 C) Oral 75 18 94 %  02/23/14 2006 120/46 mmHg 98.3 F (36.8 C) Oral 72 16 97 %  02/23/14 1651 102/49 mmHg 97.9 F (36.6 C) - 91 18 92 %  02/23/14 1449 135/54 mmHg 98.1 F (36.7 C) - 81 18 95 %  02/23/14 1345 127/55 mmHg 97.4 F (36.3 C) - 73 14 95 %  02/23/14 1330 - - - 77 10 93 %  02/23/14 1315 126/56 mmHg - - 73 14 94 %  02/23/14 1300 123/52 mmHg 97.7 F (36.5 C) - 77 8 93 %  02/23/14 1245 129/90 mmHg - - 76 12 96 %  02/23/14 1230 137/52 mmHg - - 76 8 100 %  02/23/14 1215 130/54 mmHg - - 80 5 98 %  02/23/14 1200 126/59 mmHg - - 81 11 99 %  02/23/14 1151 137/54 mmHg - - 81 15 100 %  02/23/14 1145 136/111 mmHg - - 91 17 100 %  02/23/14 1130 138/57 mmHg - - 97 15 99 %  02/23/14 1129 - 97.5 F (36.4 C) - 95 16 96 %     Recent laboratory studies:  Recent Labs  02/21/14 1413  WBC 9.4  HGB 12.4  HCT 38.9  PLT 301  NA 141  K 3.9  CL 100  CO2 28  BUN 21  CREATININE 0.86  GLUCOSE 101*  INR 0.94  CALCIUM 9.7     Discharge Medications:     Medication List    ASK your doctor about these medications       aspirin EC 81 MG tablet  Take 81 mg by mouth daily.     Biotin 5000 MCG Tabs  Take 2 tablets by mouth daily.     DULoxetine 30 MG capsule  Commonly known as:  CYMBALTA  Take 30 mg by mouth daily.     Fish Oil 600 MG Caps  Take 1 capsule by mouth 2 (two) times daily.     gabapentin 600 MG tablet  Commonly known as:  NEURONTIN  Take 600 mg by mouth 3 (three) times daily.     loratadine 10 MG tablet  Commonly known as:  CLARITIN  Take 10 mg by mouth daily.     Melatonin 10 MG Caps  Take 10 mg by mouth at bedtime.     multivitamin with minerals Tabs tablet  Take 1 tablet by mouth daily.     Oxycodone HCl 10 MG Tabs  Take 10 mg by mouth every 12 (twelve) hours.     oxyCODONE-acetaminophen 5-325 MG per tablet    Commonly known as:  PERCOCET/ROXICET  Take 1 tablet by mouth every 8 (eight) hours as needed for severe pain (pain).     pravastatin 40 MG tablet  Commonly known as:  PRAVACHOL  Take 1 tablet (40 mg total) by mouth at bedtime.     quinapril-hydrochlorothiazide 20-12.5 MG per tablet  Commonly known as:  ACCURETIC  Take 1 tablet by mouth 2 (two) times daily.     SUPER B COMPLEX Tabs  Take 1 tablet by mouth daily.     traMADol 50 MG tablet  Commonly known as:  ULTRAM  Take 50-100 mg by mouth every 6 (six) hours as needed (pain).     traZODone 50 MG tablet  Commonly known as:  DESYREL  Take 0.5-1 tablets (25-50 mg total) by mouth at bedtime.     vitamin C 500 MG tablet  Commonly known as:  ASCORBIC ACID  Take 500 mg by mouth daily.     Zinc 50 MG Caps  Take 1 capsule (50 mg total) by mouth daily.        Diagnostic Studies: Dg Chest 2 View  02/21/2014   CLINICAL DATA:  Preoperative spinal cord stimulator insertion. Hypertension  EXAM: CHEST  2 VIEW  COMPARISON:  October 12, 2013  FINDINGS: There is mild scarring in the left base region. Lungs elsewhere are clear. Heart size and pulmonary vascularity are normal. No adenopathy. There is upper thoracic dextroscoliosis with thoracolumbar levoscoliosis. There is degenerative change in the thoracic spine.  IMPRESSION: Scarring left base.  No edema or consolidation.   Electronically Signed   By: Lowella Grip M.D.   On: 02/21/2014 14:47   Dg Thoracic Spine 2 View  02/23/2014   CLINICAL DATA:  Spinal cord stimulator placement for chronic pain.  EXAM: DG C-ARM 61-120 MIN; THORACIC SPINE - 2 VIEW  COMPARISON:  None.  FINDINGS: Spinal cord stimulators are noted in the mid thoracic spine. No complicating features.  IMPRESSION: Spinal cord stimulators noted in the mid thoracic spine area.   Electronically Signed   By: Kalman Jewels M.D.   On: 02/23/2014 11:21   Dg C-arm 1-60 Min  02/23/2014   CLINICAL DATA:  Spinal cord stimulator  placement for chronic pain.  EXAM: DG C-ARM 61-120 MIN; THORACIC SPINE - 2  VIEW  COMPARISON:  None.  FINDINGS: Spinal cord stimulators are noted in the mid thoracic spine. No complicating features.  IMPRESSION: Spinal cord stimulators noted in the mid thoracic spine area.   Electronically Signed   By: Kalman Jewels M.D.   On: 02/23/2014 11:21          Follow-up Information   Follow up with Dahlia Bailiff, MD In 2 weeks. (If symptoms worsen, As needed)    Specialty:  Orthopedic Surgery   Contact information:   [address] [place] [address] [place] [address] [phone]       Discharge Plan:  discharge to home  Disposition: Doing well Ok for d/c to home Instructions provided for use of SCS    Signed: Levonte Molina D for Dr. Melina Schools [facility] [phone] 02/24/2014, 8:13 AM

## 2014-02-24 NOTE — Discharge Instructions (Signed)
Spinal Cord Stimulation Trial A spinal cord stimulation trial uses a spinal cord stimulator to see if your pain can be reduced. The spinal cord simulator is a small device with wire (leads) that are placed in your back. The stimulator sends electrical pulses to the spinal cord. These electrical pulses block the nerve impulses that cause pain. If the spinal cord stimulation trial reduces your pain, a permanent spinal cord stimulator may be placed (implanted). SPINAL CORD STIMULATOR PLACEMENT The spinal cord stimulator will be put in your back. Where the spinal cord stimulator is placed depends on where your pain is located.For a trial, the entire device is not put under your skin (implanted). Only the leads that go from the stimulator to the spinal cord are implanted. HOME CARE INSTRUCTIONS  You will be given information about the spinal cord stimulator. It will include:  How and when to use spinal cord stimulator.  How to care for the incision site and bandage.  What type of activity you can and cannot do.  What records to keep. You will have to keep a log of when you use it. You will need to say if your pain is less, the same or worse when the stimulator is in use. These records will show your caregiver whether the stimulator will work for you long-term.  In 3 to 5 days, you will need to come back to the hospital or clinic. You and your caregiver will discuss how the spinal cord stimulator worked for you and whether a permanent one should be placed.  Someone will need to drive you home after the spinal cord stimulator was placed.  Take medication as directed. Ask your caregiver if it is okay to take over-the-counter medicine. SEEK MEDICAL CARE IF:  The stimulator insertion site is red or swollen.  Blood or fluid leaks from the stimulator insertion site. SEEK IMMEDIATE MEDICAL CARE IF:   The stimulator leads come out.  The bandage that covers the stimulator leads comes off.  Your  pain gets worse.  You develop numbness or weakness in your legs or you have trouble walking.  You have problems urinating or having a bowel movement.  You have a fever or persistent symptoms for more than 2-3 days.  You have a fever and your symptoms suddenly get worse. Document Released: 07/31/2010 Document Revised: 04/01/2012 Document Reviewed: 07/31/2010 [facility] Patient Information 2015 [place], [place]. This information is not intended to replace advice given to you by your health care provider. Make sure you discuss any questions you have with your health care provider.

## 2014-02-24 NOTE — Progress Notes (Signed)
Pt and daughter given D/C instructions with Rx's, verbal understanding was provided. Pt's incision was clean and dry with no sign of infection. Pt's IV was removed prior to D/C. Pt D/C'd home via wheelchair @ 1020 per MD order. Pt is stable @ D/C and has no other needs at this time. Holli Humbles, RN

## 2014-02-24 NOTE — Evaluation (Signed)
Physical Therapy Evaluation Patient Details Name: Kimberly Larsen MRN: [id number] DOB: 28-Jul-1945 Today's Date: 02/24/2014   History of Present Illness  Pt is a 68yo female presenting s/p spinal cord stim placement 02/23/14. Pt with hx of HTN, arthritis, HLD, and CKD.  Pt had previous back surgery in 2013.  Clinical Impression  Pt currently independent with OOB mobility. Pt educated on precautions and able to demonstrate compliance during session. D/c plan discussed with pt and daughter, both agreeable. No follow up PT required. Recommend supervision PRN.     Follow Up Recommendations No PT follow up;Supervision - Intermittent    Equipment Recommendations  None recommended by PT    Recommendations for Other Services       Precautions / Restrictions Precautions Precautions: Back;Fall Precaution Booklet Issued: Yes (comment) Precaution Comments: Reviewed precautions with pt sitting EOB. Pt demonstrating compliance during session with minimal cueing. Restrictions Weight Bearing Restrictions: No      Mobility  Bed Mobility               General bed mobility comments: pt received in standing  Transfers Overall transfer level: Independent Equipment used: None             General transfer comment: No assistance required. no instability or LOB   Ambulation/Gait Ambulation/Gait assistance: Independent Ambulation Distance (Feet): 300 Feet Assistive device: None Gait Pattern/deviations: Step-through pattern;WFL(Within Functional Limits)   Gait velocity interpretation: at or above normal speed for age/gender General Gait Details: no assistance required. no instability. no increase in pain  Stairs Stairs: Yes Stairs assistance: Supervision Stair Management: Two rails;Forwards;Alternating pattern (1 hand hold as 2nd rail) Number of Stairs: 10 General stair comments: no difficulty. no instability or LOB. no extra time required.  Wheelchair Mobility    Modified  Rankin (Stroke Patients Only)       Balance Overall balance assessment: Independent                                           Pertinent Vitals/Pain Pain Assessment: No/denies pain    Home Living Family/patient expects to be discharged to:: Private residence Living Arrangements: Alone Available Help at Discharge: Family;Friend(s);Neighbor;Available PRN/intermittently Type of Home: Mobile home Home Access: Stairs to enter Entrance Stairs-Rails: Can reach both Entrance Stairs-Number of Steps: 3 Home Layout: One level Home Equipment: Walker - 2 wheels;Cane - single point;Bedside commode;Shower seat;Tub bench      Prior Function Level of Independence: Independent               Hand Dominance        Extremity/Trunk Assessment   Upper Extremity Assessment: Overall WFL for tasks assessed           Lower Extremity Assessment: Overall WFL for tasks assessed      Cervical / Trunk Assessment: Normal  Communication   Communication: No difficulties  Cognition Arousal/Alertness: Awake/alert Behavior During Therapy: WFL for tasks assessed/performed Overall Cognitive Status: Within Functional Limits for tasks assessed                      General Comments General comments (skin integrity, edema, etc.): Precautions reviewed with pt and daughter, both receptive. Pt able to adhere to precautions throughout session. pt has already set up home in preparation for precautions and has made arrangements with neighbors for assistance as needed.     Exercises General  Exercises - Lower Extremity Long Arc Quad: AROM;15 reps;Both;Seated      Assessment/Plan    PT Assessment Patent does not need any further PT services  PT Diagnosis Difficulty walking   PT Problem List    PT Treatment Interventions     PT Goals (Current goals can be found in the Care Plan section) Acute Rehab PT Goals Patient Stated Goal: to go home PT Goal Formulation: With  patient/family    Frequency     Barriers to discharge        Co-evaluation               End of Session Equipment Utilized During Treatment: Gait belt Activity Tolerance: Patient tolerated treatment well Patient left: in chair;with call bell/phone within reach;with family/visitor present Nurse Communication: Mobility status;Precautions         Time: 8871-9597 PT Time Calculation (min): 13 min   Charges:         PT G Codes:          Belen Pesch 02/24/2014, 9:12 AM Levonne Hubert, SPT

## 2014-02-24 NOTE — Evaluation (Signed)
Reviewed assessment and agree with POC.  Shivani Barrantes, PT DPT  [phone]  

## 2014-02-24 NOTE — Progress Notes (Signed)
Occupational Therapy Evaluation and Discharge Patient Details Name: Kimberly Larsen MRN: [id number] DOB: 1945-12-20 Today's Date: 02/24/2014    History of Present Illness Pt is a 68yo female presenting s/p spinal cord stim placement 02/23/14. Pt with hx of HTN, arthritis, HLD, and CKD.  Pt had previous back surgery in 2013.   Clinical Impression   PTA pt lived at home alone and was independent with ADLs. Pt currently at Mod I level for ADLs and functional mobility. No further OT concerns. Acute OT to sign off.     Follow Up Recommendations  No OT follow up    Equipment Recommendations  None recommended by OT    Recommendations for Other Services       Precautions / Restrictions Precautions Precautions: Back;Fall Precaution Booklet Issued: Yes (comment) Precaution Comments: Educated pt on 3/3 back precautions and incorporating into ADLs. Pt demonstrated compliance during session.  Restrictions Weight Bearing Restrictions: No      Mobility Bed Mobility               General bed mobility comments: Pt sitting in recliner upon OT arrival  Transfers Overall transfer level: Independent Equipment used: None             General transfer comment: No assistance required. no instability or LOB     Balance Overall balance assessment: Independent                                          ADL Overall ADL's : Modified independent                                       General ADL Comments: Pt overall Mod I for ADLs and educated on safety with tub/shower transfers and sponge bathing. Pt able to dress herself while maintaining back precautions.      Vision  No apparent deficits.                    Perception Perception Perception Tested?: No   Praxis Praxis Praxis tested?: Within functional limits    Pertinent Vitals/Pain Pain Assessment: No/denies pain     Hand Dominance Right   Extremity/Trunk Assessment Upper  Extremity Assessment Upper Extremity Assessment: Overall WFL for tasks assessed   Lower Extremity Assessment Lower Extremity Assessment: Overall WFL for tasks assessed   Cervical / Trunk Assessment Cervical / Trunk Assessment: Normal   Communication Communication Communication: No difficulties   Cognition Arousal/Alertness: Awake/alert Behavior During Therapy: WFL for tasks assessed/performed Overall Cognitive Status: Within Functional Limits for tasks assessed                     General Comments    Discussed environmental modifications and how pt plans to feed her cat so that she does not break precautions. Pt has thorough plan and adequate support at d/c             Home Living Family/patient expects to be discharged to:: Private residence Living Arrangements: Alone Available Help at Discharge: Family;Friend(s);Neighbor;Available PRN/intermittently Type of Home: Mobile home Home Access: Stairs to enter Entrance Stairs-Number of Steps: 3 Entrance Stairs-Rails: Can reach both Home Layout: One level     Bathroom Shower/Tub: Walk-in shower;Tub/shower Editor, commissioning: Standard     Home Equipment:  Walker - 2 wheels;Cane - single point;Bedside commode;Shower seat;Tub bench          Prior Functioning/Environment Level of Independence: Independent             OT Diagnosis: Acute pain                          End of Session Nurse Communication: Other (comment) (pt ready for d/c from OT standpoint)  Activity Tolerance: Patient tolerated treatment well Patient left: in chair;with call bell/phone within reach;with family/visitor present   Time: 0950-1008 OT Time Calculation (min): 18 min Charges:  OT General Charges $OT Visit: 1 Procedure OT Evaluation $Initial OT Evaluation Tier I: 1 Procedure OT Treatments $Self Care/Home Management : 8-22 mins G-Codes: OT G-codes **NOT FOR INPATIENT CLASS** Functional Assessment Tool Used: clinical  judgement Functional Limitation: Self care Self Care Current Status (I7124): At least 1 percent but less than 20 percent impaired, limited or restricted Self Care Goal Status (P8099): At least 1 percent but less than 20 percent impaired, limited or restricted Self Care Discharge Status [phone]): At least 1 percent but less than 20 percent impaired, limited or restricted  Juluis Rainier 02/24/2014, 10:27 AM  Cyndie Chime, OTR/L Occupational Therapist [phone] (pager)

## 2014-02-24 NOTE — Progress Notes (Signed)
    Subjective: Procedure(s) (LRB): SPINAL CORD STIMULATOR PLACEMENT  (N/A) 1 Day Post-Op  Patient reports pain as 1 on 0-10 scale.  Reports decreased leg pain reports incisional back pain   Positive void Negative bowel movement Positive flatus Negative chest pain or shortness of breath  Objective: Vital signs in last 24 hours: Temp:  [97.4 F (36.3 C)-98.7 F (37.1 C)] 98.7 F (37.1 C) (10/29 0405) Pulse Rate:  [72-97] 87 (10/29 0405) Resp:  [5-18] 18 (10/29 0405) BP: (102-146)/(46-111) 146/63 mmHg (10/29 0405) SpO2:  [92 %-100 %] 96 % (10/29 0405)  Intake/Output from previous day: 10/28 0701 - 10/29 0700 In: 1850 [P.O.:600; I.V.:1000; IV Piggyback:250] Out: 25 [Blood:25]  Labs:  Recent Labs  02/21/14 1413  WBC 9.4  RBC 4.32  HCT 38.9  PLT 301    Recent Labs  02/21/14 1413  NA 141  K 3.9  CL 100  CO2 28  BUN 21  CREATININE 0.86  GLUCOSE 101*  CALCIUM 9.7    Recent Labs  02/21/14 1413  INR 0.94    Physical Exam: Neurologically intact ABD soft Neurovascular intact Incision: dressing C/D/I Compartment soft  Assessment/Plan: Patient stable  xrays n/a Continue mobilization with physical therapy Continue care  Patient doing well Ambulating Pain controlled Plan on d/c today after [employer] rep activated SCS  Melina Schools, MD [place] [phone]

## 2014-02-25 ENCOUNTER — Encounter (HOSPITAL_COMMUNITY): Payer: Self-pay | Admitting: Orthopedic Surgery

## 2014-02-27 DIAGNOSIS — Z8619 Personal history of other infectious and parasitic diseases: Secondary | ICD-10-CM

## 2014-02-27 HISTORY — DX: Personal history of other infectious and parasitic diseases: Z86.19

## 2014-03-07 DIAGNOSIS — N3001 Acute cystitis with hematuria: Secondary | ICD-10-CM | POA: Diagnosis not present

## 2014-03-07 DIAGNOSIS — N3000 Acute cystitis without hematuria: Secondary | ICD-10-CM | POA: Diagnosis not present

## 2014-03-25 ENCOUNTER — Encounter (HOSPITAL_COMMUNITY)
Admission: EM | Disposition: A | Discharge status: Home Health | Payer: Self-pay | Source: Home / Self Care | Attending: Emergency Medicine

## 2014-03-25 ENCOUNTER — Inpatient Hospital Stay (HOSPITAL_COMMUNITY)
Admission: EM | Admit: 2014-03-25 | Discharge: 2014-03-29 | DRG: 871 | Disposition: A | Discharge status: Home Health | Payer: Medicare Other | Attending: Internal Medicine | Admitting: Internal Medicine

## 2014-03-25 ENCOUNTER — Inpatient Hospital Stay (HOSPITAL_COMMUNITY): Payer: Medicare Other | Admitting: Certified Registered Nurse Anesthetist

## 2014-03-25 ENCOUNTER — Inpatient Hospital Stay (HOSPITAL_COMMUNITY): Payer: Medicare Other

## 2014-03-25 ENCOUNTER — Encounter (HOSPITAL_COMMUNITY): Payer: Self-pay | Admitting: Certified Registered Nurse Anesthetist

## 2014-03-25 ENCOUNTER — Emergency Department (HOSPITAL_COMMUNITY): Payer: Medicare Other

## 2014-03-25 DIAGNOSIS — I959 Hypotension, unspecified: Secondary | ICD-10-CM | POA: Diagnosis present

## 2014-03-25 DIAGNOSIS — J811 Chronic pulmonary edema: Secondary | ICD-10-CM | POA: Diagnosis not present

## 2014-03-25 DIAGNOSIS — T83122A Displacement of urinary stent, initial encounter: Secondary | ICD-10-CM | POA: Diagnosis not present

## 2014-03-25 DIAGNOSIS — M1990 Unspecified osteoarthritis, unspecified site: Secondary | ICD-10-CM | POA: Diagnosis not present

## 2014-03-25 DIAGNOSIS — N390 Urinary tract infection, site not specified: Secondary | ICD-10-CM | POA: Diagnosis present

## 2014-03-25 DIAGNOSIS — R630 Anorexia: Secondary | ICD-10-CM | POA: Diagnosis present

## 2014-03-25 DIAGNOSIS — A419 Sepsis, unspecified organism: Secondary | ICD-10-CM | POA: Diagnosis present

## 2014-03-25 DIAGNOSIS — Z87442 Personal history of urinary calculi: Secondary | ICD-10-CM

## 2014-03-25 DIAGNOSIS — N170 Acute kidney failure with tubular necrosis: Secondary | ICD-10-CM | POA: Diagnosis present

## 2014-03-25 DIAGNOSIS — R509 Fever, unspecified: Secondary | ICD-10-CM | POA: Diagnosis not present

## 2014-03-25 DIAGNOSIS — N136 Pyonephrosis: Secondary | ICD-10-CM | POA: Diagnosis present

## 2014-03-25 DIAGNOSIS — Z96651 Presence of right artificial knee joint: Secondary | ICD-10-CM | POA: Diagnosis present

## 2014-03-25 DIAGNOSIS — E876 Hypokalemia: Secondary | ICD-10-CM | POA: Diagnosis present

## 2014-03-25 DIAGNOSIS — R1084 Generalized abdominal pain: Secondary | ICD-10-CM | POA: Diagnosis not present

## 2014-03-25 DIAGNOSIS — Z79899 Other long term (current) drug therapy: Secondary | ICD-10-CM | POA: Diagnosis not present

## 2014-03-25 DIAGNOSIS — M47895 Other spondylosis, thoracolumbar region: Secondary | ICD-10-CM | POA: Diagnosis present

## 2014-03-25 DIAGNOSIS — N179 Acute kidney failure, unspecified: Secondary | ICD-10-CM | POA: Diagnosis not present

## 2014-03-25 DIAGNOSIS — E785 Hyperlipidemia, unspecified: Secondary | ICD-10-CM | POA: Diagnosis not present

## 2014-03-25 DIAGNOSIS — E878 Other disorders of electrolyte and fluid balance, not elsewhere classified: Secondary | ICD-10-CM | POA: Diagnosis not present

## 2014-03-25 DIAGNOSIS — F419 Anxiety disorder, unspecified: Secondary | ICD-10-CM | POA: Diagnosis present

## 2014-03-25 DIAGNOSIS — J9600 Acute respiratory failure, unspecified whether with hypoxia or hypercapnia: Secondary | ICD-10-CM

## 2014-03-25 DIAGNOSIS — J9601 Acute respiratory failure with hypoxia: Secondary | ICD-10-CM | POA: Diagnosis present

## 2014-03-25 DIAGNOSIS — M5136 Other intervertebral disc degeneration, lumbar region: Secondary | ICD-10-CM | POA: Diagnosis not present

## 2014-03-25 DIAGNOSIS — A4181 Sepsis due to Enterococcus: Principal | ICD-10-CM | POA: Diagnosis present

## 2014-03-25 DIAGNOSIS — R404 Transient alteration of awareness: Secondary | ICD-10-CM | POA: Diagnosis not present

## 2014-03-25 DIAGNOSIS — Z9889 Other specified postprocedural states: Secondary | ICD-10-CM

## 2014-03-25 DIAGNOSIS — M549 Dorsalgia, unspecified: Secondary | ICD-10-CM | POA: Diagnosis present

## 2014-03-25 DIAGNOSIS — J9811 Atelectasis: Secondary | ICD-10-CM | POA: Diagnosis not present

## 2014-03-25 DIAGNOSIS — I129 Hypertensive chronic kidney disease with stage 1 through stage 4 chronic kidney disease, or unspecified chronic kidney disease: Secondary | ICD-10-CM | POA: Diagnosis present

## 2014-03-25 DIAGNOSIS — R6521 Severe sepsis with septic shock: Secondary | ICD-10-CM | POA: Diagnosis present

## 2014-03-25 DIAGNOSIS — J95821 Acute postprocedural respiratory failure: Secondary | ICD-10-CM | POA: Diagnosis not present

## 2014-03-25 DIAGNOSIS — I10 Essential (primary) hypertension: Secondary | ICD-10-CM

## 2014-03-25 DIAGNOSIS — N189 Chronic kidney disease, unspecified: Secondary | ICD-10-CM | POA: Diagnosis present

## 2014-03-25 DIAGNOSIS — Z466 Encounter for fitting and adjustment of urinary device: Secondary | ICD-10-CM | POA: Diagnosis not present

## 2014-03-25 DIAGNOSIS — N201 Calculus of ureter: Secondary | ICD-10-CM | POA: Diagnosis present

## 2014-03-25 DIAGNOSIS — F329 Major depressive disorder, single episode, unspecified: Secondary | ICD-10-CM | POA: Diagnosis present

## 2014-03-25 DIAGNOSIS — R531 Weakness: Secondary | ICD-10-CM | POA: Diagnosis present

## 2014-03-25 DIAGNOSIS — Z7982 Long term (current) use of aspirin: Secondary | ICD-10-CM | POA: Diagnosis not present

## 2014-03-25 DIAGNOSIS — D649 Anemia, unspecified: Secondary | ICD-10-CM | POA: Diagnosis present

## 2014-03-25 DIAGNOSIS — R32 Unspecified urinary incontinence: Secondary | ICD-10-CM | POA: Diagnosis not present

## 2014-03-25 DIAGNOSIS — N200 Calculus of kidney: Secondary | ICD-10-CM | POA: Diagnosis not present

## 2014-03-25 DIAGNOSIS — N139 Obstructive and reflux uropathy, unspecified: Secondary | ICD-10-CM | POA: Diagnosis present

## 2014-03-25 DIAGNOSIS — G8929 Other chronic pain: Secondary | ICD-10-CM | POA: Diagnosis present

## 2014-03-25 DIAGNOSIS — N1330 Unspecified hydronephrosis: Secondary | ICD-10-CM | POA: Diagnosis not present

## 2014-03-25 DIAGNOSIS — B952 Enterococcus as the cause of diseases classified elsewhere: Secondary | ICD-10-CM | POA: Diagnosis present

## 2014-03-25 DIAGNOSIS — M5134 Other intervertebral disc degeneration, thoracic region: Secondary | ICD-10-CM | POA: Diagnosis not present

## 2014-03-25 DIAGNOSIS — N209 Urinary calculus, unspecified: Secondary | ICD-10-CM

## 2014-03-25 HISTORY — DX: Acute kidney failure, unspecified: N17.9

## 2014-03-25 HISTORY — DX: Essential (primary) hypertension: I10

## 2014-03-25 HISTORY — DX: Sepsis, unspecified organism: A41.9

## 2014-03-25 HISTORY — DX: Obstructive and reflux uropathy, unspecified: N13.9

## 2014-03-25 HISTORY — PX: CYSTOSCOPY WITH URETEROSCOPY AND STENT PLACEMENT: SHX6377

## 2014-03-25 HISTORY — DX: Acute postprocedural respiratory failure: J95.821

## 2014-03-25 HISTORY — DX: Hyperlipidemia, unspecified: E78.5

## 2014-03-25 LAB — URINALYSIS, ROUTINE W REFLEX MICROSCOPIC
Bilirubin Urine: NEGATIVE
Glucose, UA: NEGATIVE mg/dL
Hgb urine dipstick: NEGATIVE
Ketones, ur: NEGATIVE mg/dL
Nitrite: NEGATIVE
Protein, ur: NEGATIVE mg/dL
Specific Gravity, Urine: 1.008 (ref 1.005–1.030)
Urobilinogen, UA: 0.2 mg/dL (ref 0.0–1.0)
pH: 7 (ref 5.0–8.0)

## 2014-03-25 LAB — COMPREHENSIVE METABOLIC PANEL
ALT: 14 U/L (ref 0–35)
ALT: 29 U/L (ref 0–35)
AST: 28 U/L (ref 0–37)
AST: 53 U/L — ABNORMAL HIGH (ref 0–37)
Albumin: 2.7 g/dL — ABNORMAL LOW (ref 3.5–5.2)
Albumin: 3.1 g/dL — ABNORMAL LOW (ref 3.5–5.2)
Alkaline Phosphatase: 79 U/L (ref 39–117)
Alkaline Phosphatase: 98 U/L (ref 39–117)
Anion gap: 12 (ref 5–15)
Anion gap: 11 (ref 5–15)
BUN: 45 mg/dL — ABNORMAL HIGH (ref 6–23)
BUN: 41 mg/dL — ABNORMAL HIGH (ref 6–23)
CO2: 26 mEq/L (ref 19–32)
CO2: 27 mEq/L (ref 19–32)
Calcium: 8.6 mg/dL (ref 8.4–10.5)
Calcium: 8.6 mg/dL (ref 8.4–10.5)
Chloride: 99 mEq/L (ref 96–112)
Chloride: 101 mEq/L (ref 96–112)
Creatinine, Ser: 3.1 mg/dL — ABNORMAL HIGH (ref 0.50–1.10)
Creatinine, Ser: 3.09 mg/dL — ABNORMAL HIGH (ref 0.50–1.10)
GFR calc Af Amer: 17 mL/min — ABNORMAL LOW (ref >90)
GFR calc Af Amer: 17 mL/min — ABNORMAL LOW (ref >90)
GFR calc non Af Amer: 15 mL/min — ABNORMAL LOW (ref >90)
GFR calc non Af Amer: 15 mL/min — ABNORMAL LOW (ref >90)
Glucose, Bld: 175 mg/dL — ABNORMAL HIGH (ref 70–99)
Glucose, Bld: 129 mg/dL — ABNORMAL HIGH (ref 70–99)
Potassium: 4.8 mEq/L (ref 3.7–5.3)
Potassium: 4.4 mEq/L (ref 3.7–5.3)
Sodium: 137 mEq/L (ref 137–147)
Sodium: 139 mEq/L (ref 137–147)
Total Bilirubin: 0.4 mg/dL (ref 0.3–1.2)
Total Bilirubin: 0.5 mg/dL (ref 0.3–1.2)
Total Protein: 5.4 g/dL — ABNORMAL LOW (ref 6.0–8.3)
Total Protein: 6.8 g/dL (ref 6.0–8.3)

## 2014-03-25 LAB — CBC WITH DIFFERENTIAL/PLATELET
Basophils Absolute: 0 10*3/uL (ref 0.0–0.1)
Basophils Relative: 0 % (ref 0–1)
Eosinophils Absolute: 0 10*3/uL (ref 0.0–0.7)
Eosinophils Relative: 0 % (ref 0–5)
HCT: 29.7 % — ABNORMAL LOW (ref 36.0–46.0)
Hemoglobin: 9.4 g/dL — ABNORMAL LOW (ref 12.0–15.0)
Lymphocytes Relative: 5 % — ABNORMAL LOW (ref 12–46)
Lymphs Abs: 0.6 10*3/uL — ABNORMAL LOW (ref 0.7–4.0)
MCH: 27.8 pg (ref 26.0–34.0)
MCHC: 31.6 g/dL (ref 30.0–36.0)
MCV: 87.9 fL (ref 78.0–100.0)
Monocytes Absolute: 0.9 10*3/uL (ref 0.1–1.0)
Monocytes Relative: 7 % (ref 3–12)
Neutro Abs: 12 10*3/uL — ABNORMAL HIGH (ref 1.7–7.7)
Neutrophils Relative %: 88 % — ABNORMAL HIGH (ref 43–77)
Platelets: 226 10*3/uL (ref 150–400)
RBC: 3.38 MIL/uL — ABNORMAL LOW (ref 3.87–5.11)
RDW: 15.2 % (ref 11.5–15.5)
WBC: 13.6 10*3/uL — ABNORMAL HIGH (ref 4.0–10.5)

## 2014-03-25 LAB — BLOOD GAS, ARTERIAL
Acid-Base Excess: 0.1 mmol/L (ref 0.0–2.0)
Acid-base deficit: 1.4 mmol/L (ref 0.0–2.0)
Bicarbonate: 25.4 mEq/L — ABNORMAL HIGH (ref 20.0–24.0)
Bicarbonate: 23.3 mEq/L (ref 20.0–24.0)
Drawn by: 308601
Drawn by: 308601
FIO2: 1 %
FIO2: 0.6 %
MECHVT: 400 mL
MECHVT: 500 mL
O2 Saturation: 98.8 %
O2 Saturation: 99.6 %
PEEP: 5 cmH2O
PEEP: 5 cmH2O
Patient temperature: 102
Patient temperature: 98.6
RATE: 14 resp/min
RATE: 18 resp/min
TCO2: 24 mmol/L (ref 0–100)
TCO2: 21.6 mmol/L (ref 0–100)
pCO2 arterial: 61 mmHg (ref 35.0–45.0)
pCO2 arterial: 34.2 mmHg — ABNORMAL LOW (ref 35.0–45.0)
pH, Arterial: 7.255 — ABNORMAL LOW (ref 7.350–7.450)
pH, Arterial: 7.448 (ref 7.350–7.450)
pO2, Arterial: 170 mmHg — ABNORMAL HIGH (ref 80.0–100.0)
pO2, Arterial: 176 mmHg — ABNORMAL HIGH (ref 80.0–100.0)

## 2014-03-25 LAB — CBC
HCT: 36.9 % (ref 36.0–46.0)
Hemoglobin: 11.6 g/dL — ABNORMAL LOW (ref 12.0–15.0)
MCH: 28.2 pg (ref 26.0–34.0)
MCHC: 31.4 g/dL (ref 30.0–36.0)
MCV: 89.8 fL (ref 78.0–100.0)
Platelets: 212 10*3/uL (ref 150–400)
RBC: 4.11 MIL/uL (ref 3.87–5.11)
RDW: 15.2 % (ref 11.5–15.5)
WBC: 11.3 10*3/uL — ABNORMAL HIGH (ref 4.0–10.5)

## 2014-03-25 LAB — URINE MICROSCOPIC-ADD ON

## 2014-03-25 LAB — LACTATE DEHYDROGENASE: LDH: 290 U/L — ABNORMAL HIGH (ref 94–250)

## 2014-03-25 LAB — I-STAT CG4 LACTIC ACID, ED
Lactic Acid, Venous: 1.24 mmol/L (ref 0.5–2.2)
Lactic Acid, Venous: 0.61 mmol/L (ref 0.5–2.2)

## 2014-03-25 LAB — PRO B NATRIURETIC PEPTIDE: Pro B Natriuretic peptide (BNP): 2305 pg/mL — ABNORMAL HIGH (ref 0–125)

## 2014-03-25 LAB — LACTIC ACID, PLASMA: Lactic Acid, Venous: 1.2 mmol/L (ref 0.5–2.2)

## 2014-03-25 SURGERY — CYSTOURETEROSCOPY, WITH STENT INSERTION
Anesthesia: General | Site: Ureter | Laterality: Bilateral

## 2014-03-25 MED ORDER — FAMOTIDINE IN NACL 20-0.9 MG/50ML-% IV SOLN
20.0000 mg | Freq: Two times a day (BID) | INTRAVENOUS | Status: DC
  Administered 2014-03-25 – 2014-03-27 (×4): 20 mg via INTRAVENOUS
  Filled 2014-03-25 (×6): qty 50

## 2014-03-25 MED ORDER — MIDAZOLAM HCL 2 MG/2ML IJ SOLN
INTRAMUSCULAR | Status: AC
  Filled 2014-03-25: qty 2

## 2014-03-25 MED ORDER — VITAMIN C 500 MG PO TABS
500.0000 mg | ORAL_TABLET | Freq: Every day | ORAL | Status: DC
  Filled 2014-03-25: qty 1

## 2014-03-25 MED ORDER — PIPERACILLIN-TAZOBACTAM IN DEX 2-0.25 GM/50ML IV SOLN
2.2500 g | Freq: Four times a day (QID) | INTRAVENOUS | Status: DC
  Administered 2014-03-25 – 2014-03-26 (×3): 2.25 g via INTRAVENOUS
  Filled 2014-03-25 (×4): qty 50

## 2014-03-25 MED ORDER — FISH OIL 600 MG PO CAPS: 1.0000 | ORAL_CAPSULE | Freq: Two times a day (BID) | ORAL | Status: DC

## 2014-03-25 MED ORDER — NOREPINEPHRINE BITARTRATE 1 MG/ML IV SOLN
0.0000 ug/min | INTRAVENOUS | Status: DC
  Administered 2014-03-25: 2 ug/min via INTRAVENOUS
  Filled 2014-03-25: qty 16

## 2014-03-25 MED ORDER — ONDANSETRON HCL 4 MG/2ML IJ SOLN
INTRAMUSCULAR | Status: AC
  Filled 2014-03-25: qty 2

## 2014-03-25 MED ORDER — PROPOFOL 10 MG/ML IV BOLUS
INTRAVENOUS | Status: DC | PRN
  Administered 2014-03-25: 150 mg via INTRAVENOUS

## 2014-03-25 MED ORDER — FENTANYL CITRATE 0.05 MG/ML IJ SOLN
INTRAMUSCULAR | Status: AC
  Filled 2014-03-25: qty 2

## 2014-03-25 MED ORDER — METHOCARBAMOL 500 MG PO TABS: 500.0000 mg | ORAL_TABLET | Freq: Three times a day (TID) | ORAL | Status: DC | PRN

## 2014-03-25 MED ORDER — MELATONIN 10 MG PO CAPS: 10.0000 mg | ORAL_CAPSULE | Freq: Every day | ORAL | Status: DC

## 2014-03-25 MED ORDER — PHENYLEPHRINE HCL 10 MG/ML IJ SOLN
10.0000 mg | INTRAVENOUS | Status: DC | PRN
  Administered 2014-03-25: 10 ug/min via INTRAVENOUS

## 2014-03-25 MED ORDER — CHLORHEXIDINE GLUCONATE 0.12 % MT SOLN
15.0000 mL | Freq: Two times a day (BID) | OROMUCOSAL | Status: DC
  Administered 2014-03-25 – 2014-03-26 (×3): 15 mL via OROMUCOSAL
  Filled 2014-03-25 (×3): qty 15

## 2014-03-25 MED ORDER — PHENYLEPHRINE 40 MCG/ML (10ML) SYRINGE FOR IV PUSH (FOR BLOOD PRESSURE SUPPORT)
PREFILLED_SYRINGE | INTRAVENOUS | Status: AC
  Filled 2014-03-25: qty 10

## 2014-03-25 MED ORDER — PIPERACILLIN-TAZOBACTAM 3.375 G IVPB 30 MIN: 3.3750 g | Freq: Three times a day (TID) | INTRAVENOUS | Status: DC

## 2014-03-25 MED ORDER — FENTANYL CITRATE 0.05 MG/ML IJ SOLN: 25.0000 ug | INTRAMUSCULAR | Status: DC | PRN

## 2014-03-25 MED ORDER — ADULT MULTIVITAMIN W/MINERALS CH
1.0000 | ORAL_TABLET | Freq: Every day | ORAL | Status: DC
  Administered 2014-03-26 – 2014-03-29 (×4): 1 via ORAL
  Filled 2014-03-25 (×4): qty 1

## 2014-03-25 MED ORDER — LORATADINE 10 MG PO TABS: 10.0000 mg | ORAL_TABLET | Freq: Every day | ORAL | Status: DC | PRN

## 2014-03-25 MED ORDER — ENOXAPARIN SODIUM 30 MG/0.3ML ~~LOC~~ SOLN
30.0000 mg | SUBCUTANEOUS | Status: DC
  Administered 2014-03-25 – 2014-03-26 (×2): 30 mg via SUBCUTANEOUS
  Filled 2014-03-25 (×2): qty 0.3

## 2014-03-25 MED ORDER — ONDANSETRON HCL 4 MG PO TABS: 4.0000 mg | ORAL_TABLET | Freq: Four times a day (QID) | ORAL | Status: DC | PRN

## 2014-03-25 MED ORDER — PROPOFOL 10 MG/ML IV BOLUS
INTRAVENOUS | Status: AC
  Filled 2014-03-25: qty 20

## 2014-03-25 MED ORDER — PIPERACILLIN-TAZOBACTAM 3.375 G IVPB
3.3750 g | Freq: Three times a day (TID) | INTRAVENOUS | Status: DC
  Administered 2014-03-25 (×2): 3.375 g via INTRAVENOUS
  Filled 2014-03-25: qty 50

## 2014-03-25 MED ORDER — ACETAMINOPHEN 325 MG PO TABS
650.0000 mg | ORAL_TABLET | Freq: Four times a day (QID) | ORAL | Status: DC | PRN
  Administered 2014-03-27: 650 mg via ORAL
  Filled 2014-03-25: qty 2

## 2014-03-25 MED ORDER — LORAZEPAM 2 MG/ML IJ SOLN
0.5000 mg | INTRAMUSCULAR | Status: DC | PRN
  Administered 2014-03-25 – 2014-03-27 (×3): 2 mg via INTRAVENOUS
  Administered 2014-03-28: 1 mg via INTRAVENOUS
  Filled 2014-03-25 (×3): qty 1

## 2014-03-25 MED ORDER — HYDROCORTISONE NA SUCCINATE PF 100 MG IJ SOLR
50.0000 mg | Freq: Four times a day (QID) | INTRAMUSCULAR | Status: DC
  Administered 2014-03-25 – 2014-03-26 (×2): 50 mg via INTRAVENOUS
  Filled 2014-03-25 (×2): qty 2

## 2014-03-25 MED ORDER — CITALOPRAM HYDROBROMIDE 20 MG PO TABS: 40.0000 mg | ORAL_TABLET | Freq: Every day | ORAL | Status: DC

## 2014-03-25 MED ORDER — SODIUM CHLORIDE 0.9 % IV BOLUS (SEPSIS)
1000.0000 mL | INTRAVENOUS | Status: AC
  Administered 2014-03-25 (×3): 1000 mL via INTRAVENOUS

## 2014-03-25 MED ORDER — FENTANYL CITRATE 0.05 MG/ML IJ SOLN
INTRAMUSCULAR | Status: AC
  Filled 2014-03-25: qty 5

## 2014-03-25 MED ORDER — OMEGA-3-ACID ETHYL ESTERS 1 G PO CAPS
1.0000 g | ORAL_CAPSULE | Freq: Two times a day (BID) | ORAL | Status: DC
  Filled 2014-03-25: qty 1

## 2014-03-25 MED ORDER — PHENYLEPHRINE HCL 10 MG/ML IJ SOLN
30.0000 ug/min | INTRAVENOUS | Status: DC
  Administered 2014-03-25: 50 ug/min via INTRAVENOUS
  Filled 2014-03-25: qty 1

## 2014-03-25 MED ORDER — SUCCINYLCHOLINE CHLORIDE 20 MG/ML IJ SOLN
INTRAMUSCULAR | Status: DC | PRN
  Administered 2014-03-25: 100 mg via INTRAVENOUS

## 2014-03-25 MED ORDER — VANCOMYCIN HCL IN DEXTROSE 1-5 GM/200ML-% IV SOLN: 1000.0000 mg | INTRAVENOUS | Status: DC

## 2014-03-25 MED ORDER — PROMETHAZINE HCL 25 MG/ML IJ SOLN: 6.2500 mg | INTRAMUSCULAR | Status: DC | PRN

## 2014-03-25 MED ORDER — SODIUM CHLORIDE 0.9 % IV BOLUS (SEPSIS)
1000.0000 mL | Freq: Once | INTRAVENOUS | Status: AC
Start: 2014-03-25 — End: 2014-03-25
  Administered 2014-03-25: 1000 mL via INTRAVENOUS

## 2014-03-25 MED ORDER — PRAVASTATIN SODIUM 40 MG PO TABS
40.0000 mg | ORAL_TABLET | Freq: Every day | ORAL | Status: DC
  Filled 2014-03-25: qty 1

## 2014-03-25 MED ORDER — LIDOCAINE HCL (CARDIAC) 20 MG/ML IV SOLN
INTRAVENOUS | Status: AC
  Filled 2014-03-25: qty 5

## 2014-03-25 MED ORDER — MORPHINE SULFATE 2 MG/ML IJ SOLN: 2.0000 mg | INTRAMUSCULAR | Status: DC | PRN

## 2014-03-25 MED ORDER — SODIUM CHLORIDE 0.9 % IV BOLUS (SEPSIS)
1000.0000 mL | Freq: Once | INTRAVENOUS | Status: AC
  Administered 2014-03-25: 1000 mL via INTRAVENOUS

## 2014-03-25 MED ORDER — 0.9 % SODIUM CHLORIDE (POUR BTL) OPTIME
TOPICAL | Status: DC | PRN
  Administered 2014-03-25: 1000 mL

## 2014-03-25 MED ORDER — MIDAZOLAM HCL 2 MG/2ML IJ SOLN
0.5000 mg | Freq: Once | INTRAMUSCULAR | Status: AC | PRN
  Administered 2014-03-25: 2 mg via INTRAVENOUS
  Filled 2014-03-25: qty 2

## 2014-03-25 MED ORDER — FENTANYL CITRATE 0.05 MG/ML IJ SOLN
50.0000 ug | INTRAMUSCULAR | Status: DC | PRN
  Administered 2014-03-25: 50 ug via INTRAVENOUS
  Filled 2014-03-25: qty 2

## 2014-03-25 MED ORDER — FENTANYL CITRATE 0.05 MG/ML IJ SOLN
50.0000 ug | INTRAMUSCULAR | Status: DC | PRN
  Administered 2014-03-25: 100 ug via INTRAVENOUS

## 2014-03-25 MED ORDER — ONDANSETRON HCL 4 MG/2ML IJ SOLN
4.0000 mg | Freq: Four times a day (QID) | INTRAMUSCULAR | Status: DC | PRN
  Administered 2014-03-25: 4 mg via INTRAVENOUS
  Filled 2014-03-25: qty 2

## 2014-03-25 MED ORDER — SODIUM CHLORIDE 0.9 % IV SOLN
INTRAVENOUS | Status: DC
  Administered 2014-03-25 – 2014-03-26 (×2): via INTRAVENOUS

## 2014-03-25 MED ORDER — VANCOMYCIN HCL IN DEXTROSE 1-5 GM/200ML-% IV SOLN
1000.0000 mg | Freq: Once | INTRAVENOUS | Status: AC
  Administered 2014-03-25: 1000 mg via INTRAVENOUS
  Filled 2014-03-25: qty 200

## 2014-03-25 MED ORDER — MIDAZOLAM HCL 5 MG/5ML IJ SOLN
INTRAMUSCULAR | Status: DC | PRN
  Administered 2014-03-25: 2 mg via INTRAVENOUS

## 2014-03-25 MED ORDER — SODIUM CHLORIDE 0.9 % IV BOLUS (SEPSIS)
750.0000 mL | Freq: Once | INTRAVENOUS | Status: AC
  Administered 2014-03-25: 750 mL via INTRAVENOUS

## 2014-03-25 MED ORDER — CETYLPYRIDINIUM CHLORIDE 0.05 % MT LIQD
7.0000 mL | Freq: Four times a day (QID) | OROMUCOSAL | Status: DC
  Administered 2014-03-25 – 2014-03-27 (×6): 7 mL via OROMUCOSAL

## 2014-03-25 MED ORDER — MEPERIDINE HCL 25 MG/ML IJ SOLN: 6.2500 mg | INTRAMUSCULAR | Status: DC | PRN

## 2014-03-25 MED ORDER — LIDOCAINE HCL (CARDIAC) 20 MG/ML IV SOLN
INTRAVENOUS | Status: DC | PRN
  Administered 2014-03-25: 50 mg via INTRAVENOUS

## 2014-03-25 MED ORDER — LACTATED RINGERS IV SOLN
INTRAVENOUS | Status: DC | PRN
  Administered 2014-03-25: 19:00:00 via INTRAVENOUS

## 2014-03-25 MED ORDER — GABAPENTIN 300 MG PO CAPS
600.0000 mg | ORAL_CAPSULE | Freq: Three times a day (TID) | ORAL | Status: DC
  Administered 2014-03-25: 600 mg via ORAL
  Filled 2014-03-25: qty 2

## 2014-03-25 MED ORDER — SODIUM CHLORIDE 0.9 % IJ SOLN
3.0000 mL | Freq: Two times a day (BID) | INTRAMUSCULAR | Status: DC
  Administered 2014-03-26 – 2014-03-29 (×4): 3 mL via INTRAVENOUS

## 2014-03-25 MED ORDER — ALBUTEROL SULFATE (2.5 MG/3ML) 0.083% IN NEBU: 2.5000 mg | INHALATION_SOLUTION | RESPIRATORY_TRACT | Status: DC | PRN

## 2014-03-25 MED ORDER — PIPERACILLIN-TAZOBACTAM 3.375 G IVPB 30 MIN
3.3750 g | Freq: Once | INTRAVENOUS | Status: AC
Start: 2014-03-25 — End: 2014-03-25
  Administered 2014-03-25: 3.375 g via INTRAVENOUS
  Filled 2014-03-25: qty 50

## 2014-03-25 MED ORDER — FENTANYL CITRATE 0.05 MG/ML IJ SOLN
INTRAMUSCULAR | Status: DC | PRN
  Administered 2014-03-25: 100 ug via INTRAVENOUS

## 2014-03-25 MED ORDER — TRAZODONE HCL 50 MG PO TABS
25.0000 mg | ORAL_TABLET | Freq: Every day | ORAL | Status: DC
  Administered 2014-03-25: 50 mg via ORAL
  Filled 2014-03-25: qty 1

## 2014-03-25 MED ORDER — ASPIRIN EC 81 MG PO TBEC
81.0000 mg | DELAYED_RELEASE_TABLET | Freq: Every day | ORAL | Status: DC
  Administered 2014-03-26 – 2014-03-29 (×4): 81 mg via ORAL
  Filled 2014-03-25 (×4): qty 1

## 2014-03-25 MED ORDER — ACETAMINOPHEN 650 MG RE SUPP: 650.0000 mg | Freq: Four times a day (QID) | RECTAL | Status: DC | PRN

## 2014-03-25 MED ORDER — OXYCODONE-ACETAMINOPHEN 10-325 MG PO TABS: 1.0000 | ORAL_TABLET | ORAL | Status: DC | PRN

## 2014-03-25 SURGICAL SUPPLY — 18 items
BAG URINE DRAINAGE (UROLOGICAL SUPPLIES) ×1 IMPLANT
BAG URO CATCHER STRL LF (DRAPE) ×2 IMPLANT
BASKET ZERO TIP NITINOL 2.4FR (BASKET) IMPLANT
BSKT STON RTRVL ZERO TP 2.4FR (BASKET)
CATH INTERMIT  6FR 70CM (CATHETERS) ×1 IMPLANT
CLOTH BEACON ORANGE TIMEOUT ST (SAFETY) ×2 IMPLANT
DRAPE CAMERA CLOSED 9X96 (DRAPES) ×2 IMPLANT
GLOVE BIOGEL M STRL SZ7.5 (GLOVE) ×2 IMPLANT
GOWN STRL REUS W/TWL LRG LVL3 (GOWN DISPOSABLE) ×4 IMPLANT
GUIDEWIRE ANG ZIPWIRE 038X150 (WIRE) IMPLANT
GUIDEWIRE STR DUAL SENSOR (WIRE) ×2 IMPLANT
IV NS 1000ML (IV SOLUTION) ×2
IV NS 1000ML BAXH (IV SOLUTION) ×1 IMPLANT
MANIFOLD NEPTUNE II (INSTRUMENTS) ×2 IMPLANT
PACK CYSTO (CUSTOM PROCEDURE TRAY) ×2 IMPLANT
STENT URET 6FRX24 CONTOUR (STENTS) ×2 IMPLANT
TRAY FOLEY METER SIL LF 16FR (CATHETERS) ×1 IMPLANT
TUBING CONNECTING 10 (TUBING) ×2 IMPLANT

## 2014-03-25 NOTE — ED Notes (Signed)
Patient has an implanted stimulator for her back.

## 2014-03-25 NOTE — Transfer of Care (Signed)
Immediate Anesthesia Transfer of Care Note  Patient: Kimberly Larsen  Procedure(s) Performed: Procedure(s): CYSTOSCOPY WITH URETEROSCOPY AND STENT PLACEMENT (Bilateral)  Patient Location: ICU  Anesthesia Type:General  Level of Consciousness: sedated, patient cooperative and Patient remains intubated per anesthesia plan  Airway & Oxygen Therapy: Patient placed on Ventilator (see vital sign flow sheet for setting)  Post-op Assessment: Report given to PACU RN and Post -op Vital signs reviewed and stable  Post vital signs: Reviewed and stable  Complications: No apparent anesthesia complications

## 2014-03-25 NOTE — Progress Notes (Signed)
PHARMACIST - PHYSICIAN ORDER COMMUNICATION  CONCERNING: P&T Medication Policy on Herbal Medications  DESCRIPTION:  This patient's order for:  Melatonin has been noted.  This product(s) is classified as an "herbal" or natural product. Due to a lack of definitive safety studies or FDA approval, nonstandard manufacturing practices, plus the potential risk of unknown drug-drug interactions while on inpatient medications, the Pharmacy and Therapeutics Committee does not permit the use of "herbal" or natural products of this type within [facility].   ACTION TAKEN: The pharmacy department is unable to verify this order at this time and your patient has been informed of this safety policy. Please reevaluate patient's clinical condition at discharge and address if the herbal or natural product(s) should be resumed at that time.  Dolly Rias RPh 03/25/2014, 7:59 PM Pager [phone]

## 2014-03-25 NOTE — H&P (Signed)
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 The patient did not take her temperature at home, but she was having shivering and rigors on the day of admission. On the morning of admission, the patient actually rolled out of bed and fell onto the ground accidentally. Because of her generalized weakness she had much difficulty getting up, but ultimately was able to get to a phone to activate EMS. The patient denied any chest pain, coughing, hemoptysis, vomiting, diarrhea, hematochezia, melena. Patient stated that she has had left upper quadrant abdominal pain for approximately 2 weeks. She went to see her primary care provider on 03/11/2014. The patient was diagnosed with a UTI and started on ciprofloxacin, the last dose of which she took on 03/19/2014. In addition, the patient had a spinal stimulator placed on 02/23/2014 by Dr. Rolena Infante.  Upon arrival to emergency department, the patient was noted to be hypotensive with systolic blood pressure of 84. She had a fever 101.58F. Serum creatinine was noted to be 3.10 with a WBC count of 13.6. Blood cultures and urine cultures were obtained and the patient was started on empiric vancomycin and Zosyn. Lactic acid was 1.24 Assessment/Plan: Sepsis -Secondary to genitourinary source -CT of the thoracic and lumbar spine revealed bilateral hydronephrosis with bilateral obstructing UPJ stones--there was not any osteomyelitis, discitis, or paravertebral fluid collections -Continue IV fluids--after 2 L in the emergency department, the patient's blood pressure improved into the 120s -Continue intravenous  antibiotics -Examination of the surgical sites from the spinal stimulator did not reveal any signs of infection--there was no tenderness, no erythema, no induration -Chest x-ray negative -Unfortunately, the patient's recent antibiotics may have compromised any cultures and may have influenced the patient's urinalysis- Obstructive uropathy with acute kidney injury -Baseline creatinine 0.8 -I consulted urology--spoke with Dr. Alinda Money -pt will be transferred to stepdown unit at [facility] -Renal ultrasound -Discontinue HCTZ and ACE inhibitor Hyperlipidemia -Continue fish oil and statin Anxiety/depression  -Continue Celexa and trazodone  Anemia -no sign of retroperitoneal bleed on CT -FOBT -check LDH and haptoglobin -pt had Hgb 12.4 on 02/21/14       Past Medical History  Diagnosis Date  . Hypertension   . Arthritis   . HLD (hyperlipidemia)   . History of kidney stones   . Chronic kidney disease   . Anxiety    Past Surgical History  Procedure Laterality Date  . Tubal ligation    . Ganglion cyst excision      L ankle  . Rhinoplasty    . Lithotripsy    . Carpal tunnel release      R hand  . Back surgery  2013  . Total knee arthroplasty Right 08/31/2012    Procedure: RIGHT TOTAL KNEE ARTHROPLASTY;  Surgeon: Mauri Pole, MD;  Location: [facility];  Service: Orthopedics;  Laterality: Right;  with LMA  . Joint replacement      total knee  . Spinal cord stimulator insertion N/A 02/23/2014    Procedure: SPINAL CORD STIMULATOR PLACEMENT ;  Surgeon: Melina Schools, MD;  Location: [facility];  Service: Orthopedics;  Laterality: N/A;   Social History:  reports that she has never smoked. She  does not have any smokeless tobacco history on file. She reports that she does not drink alcohol or use illicit drugs.  Family Hx: reviewed.  No pertinent family hx  No Known Allergies    Prior to Admission medications   Medication Sig Start Date End Date Taking? Authorizing Provider  aspirin EC 81 MG  tablet Take 81 mg by mouth daily.   Yes Historical Provider, MD  B Complex-C (SUPER B COMPLEX) TABS Take 1 tablet by mouth daily. 09/11/12  Yes Judeth Cornfield, NP  Biotin 5000 MCG TABS Take 2 tablets by mouth daily. 09/11/12  Yes Judeth Cornfield, NP  citalopram (CELEXA) 40 MG tablet Take 1 tablet by mouth daily. 02/08/14  Yes Historical Provider, MD  gabapentin (NEURONTIN) 600 MG tablet Take 600 mg by mouth 3 (three) times daily.   Yes Historical Provider, MD  loratadine (CLARITIN) 10 MG tablet Take 10 mg by mouth daily as needed for allergies.    Yes Historical Provider, MD  Melatonin 10 MG CAPS Take 10 mg by mouth at bedtime.   Yes Historical Provider, MD  methocarbamol (ROBAXIN) 500 MG tablet Take 1 tablet (500 mg total) by mouth 3 (three) times daily as needed for muscle spasms. 02/24/14  Yes Melina Schools, MD  Multiple Vitamin (MULTIVITAMIN WITH MINERALS) TABS Take 1 tablet by mouth daily. 09/11/12  Yes Judeth Cornfield, NP  Omega-3 Fatty Acids (FISH OIL) 600 MG CAPS Take 1 capsule by mouth 2 (two) times daily. 09/11/12  Yes Judeth Cornfield, NP  ondansetron (ZOFRAN) 4 MG tablet Take 1 tablet (4 mg total) by mouth every 8 (eight) hours as needed for nausea or vomiting. 02/24/14  Yes Melina Schools, MD  oxyCODONE-acetaminophen (PERCOCET) 10-325 MG per tablet Take 1 tablet by mouth every 4 (four) hours as needed for pain. 02/24/14  Yes Melina Schools, MD  pravastatin (PRAVACHOL) 40 MG tablet Take 1 tablet (40 mg total) by mouth at bedtime. 09/11/12  Yes Judeth Cornfield, NP  quinapril-hydrochlorothiazide (ACCURETIC) 20-12.5 MG per tablet Take 1 tablet by mouth 2 (two) times daily. 09/11/12  Yes Judeth Cornfield, NP  traZODone (DESYREL) 50 MG tablet Take 0.5-1 tablets (25-50 mg total) by mouth at bedtime. 09/11/12  Yes Judeth Cornfield, NP  vitamin C (ASCORBIC ACID) 500 MG tablet Take 500 mg by mouth daily.   Yes Historical Provider, MD  Zinc 50 MG CAPS Take 1 capsule (50 mg total) by mouth daily. 09/11/12  Yes Judeth Cornfield, NP     Review of Systems:  Constitutional:  No weight loss, night sweats, Head&Eyes: No headache.  No vision loss.  No eye pain or scotoma ENT:  No Difficulty swallowing,Tooth/dental problems,Sore throat,   Cardio-vascular:  No chest pain, Orthopnea, PND, swelling in lower extremities,  dizziness, palpitations  GI:  No  abdominal pain, nausea, vomiting, diarrhea, loss of appetite, hematochezia, melena, heartburn, indigestion, Resp:  Patient complaining of some mild dyspnea on exertion.  No cough. No coughing up of blood .No wheezing.No chest wall deformity  Skin:  no rash or lesions.  GU:  no dysuria, change in color of urine, no urgency or frequency. No flank pain.  Musculoskeletal:  No joint pain or swelling. No decreased range of motion.  Psych:  No change in mood or affect.  Neurologic: No headache, no dysesthesia, no focal weakness, no vision loss. No syncope  Physical Exam: Filed Vitals:   03/25/14 1515 03/25/14 1530 03/25/14 1545 03/25/14 1600  BP: 114/53 113/60 126/56 144/55  Pulse: 70 72 73 82  Temp:      TempSrc:      Resp: 11 18 15 16   Height:      Weight:      SpO2: 100% 98% 97% 98%   General:  A&O x 3, NAD, nontoxic, pleasant/cooperative Head/Eye: No conjunctival hemorrhage, no icterus, [place]/AT, No nystagmus ENT:  No icterus,  No thrush, good dentition, no pharyngeal exudate Neck:  No masses, no lymphadenpathy, no bruits CV:  RRR, no rub, no gallop, no S3 Lung:  CTAB, good air movement, no wheeze, no rhonchi Abdomen: soft/NT, +BS, nondistended, no peritoneal signs Ext: No cyanosis, No rashes, No petechiae, No lymphangitis, No edema; examination of the patient's back and surgical sites without any erythema, induration, or tenderness  Neuro: CNII-XII intact, strength 4/5 in bilateral upper and lower extremities, no dysmetria  Labs on Admission:  Basic Metabolic Panel:  Recent Labs Lab 03/25/14 1200  NA 137  K 4.8  CL 99  CO2 26  GLUCOSE 175*  BUN 45*   CREATININE 3.10*  CALCIUM 8.6   Liver Function Tests:  Recent Labs Lab 03/25/14 1200  AST 28  ALT 14  ALKPHOS 79  BILITOT 0.4  PROT 5.4*  ALBUMIN 2.7*   No results for input(s): LIPASE, AMYLASE in the last 168 hours. No results for input(s): AMMONIA in the last 168 hours. CBC:  Recent Labs Lab 03/25/14 1200  WBC 13.6*  NEUTROABS 12.0*  HGB 9.4*  HCT 29.7*  MCV 87.9  PLT 226   Cardiac Enzymes: No results for input(s): CKTOTAL, CKMB, CKMBINDEX, TROPONINI in the last 168 hours. BNP: Invalid input(s): POCBNP CBG: No results for input(s): GLUCAP in the last 168 hours.  Radiological Exams on Admission: Dg Chest 2 View  03/25/2014   CLINICAL DATA:  Fever since earlier this morning.  EXAM: CHEST  2 VIEW  COMPARISON:  02/21/2014.  FINDINGS: Normal sized heart. Stable linear scarring at the left lung base. Interval neural stimulator leads in the lower thoracic spinal canal. Diffuse osteopenia. Thoracic spine degenerative changes.  IMPRESSION: No active cardiopulmonary disease.   Electronically Signed   By: Enrique Sack M.D.   On: 03/25/2014 10:44   Ct Thoracic Spine Wo Contrast  03/25/2014   CLINICAL DATA:  Sepsis, mid to low back pain, onset of weakness at 1300 hr yesterday, fever, nausea and dizziness with walking  EXAM: CT THORACIC AND LUMBAR SPINE WITHOUT CONTRAST  TECHNIQUE: Multidetector CT imaging of the thoracic and lumbar spine was performed without contrast. Multiplanar CT image reconstructions were also generated.  COMPARISON:  CT lumbar myelography 01/19/2014  FINDINGS: CT THORACIC SPINE FINDINGS  Twelve pairs of ribs.  Diffuse osseous demineralization.  Intraspinal stimulator lead at inferior T7 to superior T10.  Scattered disc space narrowing and endplate spur formation throughout thoracic spine.  No acute fracture or subluxation.  No bone destruction seen to suggest vertebral osteomyelitis or discitis.  Dependent atelectasis in both lungs.  No abnormal paraspinal  fluid collections.  No definite intraspinal abnormalities identified by noncontrast CT.  CT LUMBAR SPINE FINDINGS  Osseous demineralization.  Five non-rib-bearing lumbar vertebrae.  Prior L3-L4 posterior fusion and L4 laminectomy with intact hardware.  Superior endplate concavity at L4.  Vertebral body heights otherwise maintained without fracture or additional subluxation.  Multilevel facet degenerative changes lumbar spine.  No bone destruction or paraspinal fluid collections identified to suggest osteomyelitis or discitis.  L1-L2:  No abnormalities  L2-L3: Mild diffuse disc bulge. Ligamentum flavum thickening. No focal disc herniation or neural compression. AP  narrowing of spinal canal, multifactorial.  L3-L4: Foramina grossly patent. No disc herniation or neural compression.  L4-L5: Significant artifacts from lumbar hardware and intraspinal stimulator generator pack. Diffuse disc bulge extending into LEFT neural foramen without compression of nerve root.  L5-S1: Retrolisthesis with disc space narrowing, vacuum phenomenon, endplate spurs and pseudo disc versus calcified disc fragment, narrowing AP diameter of spinal canal. No definite compression of the S1 roots as they enter the lateral recesses. Foramina patent.  BILATERAL hydronephrosis LEFT greater than RIGHT secondary to UPJ calculi, 9 x 6 mm LEFT and 7 x 8 mm RIGHT.  Additional BILATERAL renal calculi up to 10 mm diameter RIGHT and 11 mm diameter LEFT.  No ureteral dilatation.  Calcified leiomyoma in uterus.  Sigmoid diverticulosis.  No paraspinal fluid collections.  IMPRESSION: CT THORACIC SPINE IMPRESSION  Multilevel degenerative disc and facet disease changes.  Osseous demineralization.  No acute thoracic spine abnormalities.  CT LUMBAR SPINE IMPRESSION  Prior L3-L4 fusion.  Multilevel degenerative disc and facet disease changes.  Bulging L4-L5 disc extending into LEFT neural foramen without neural compression.  Retrolisthesis with pseudo disc, endplate  spurs and question focal disc herniation at L5-S1, causing no neural compression.  Multiple BILATERAL renal calculi as above with associated BILATERAL hydronephrosis LEFT greater than RIGHT due to large UPJ calculi bilaterally.  Sigmoid diverticulosis.  Findings called to [facility] PA on 03/25/2014 at 1550 hr.   Electronically Signed   By: Lavonia Dana M.D.   On: 03/25/2014 15:54   Ct Lumbar Spine Wo Contrast  03/25/2014   CLINICAL DATA:  Sepsis, mid to low back pain, onset of weakness at 1300 hr yesterday, fever, nausea and dizziness with walking  EXAM: CT THORACIC AND LUMBAR SPINE WITHOUT CONTRAST  TECHNIQUE: Multidetector CT imaging of the thoracic and lumbar spine was performed without contrast. Multiplanar CT image reconstructions were also generated.  COMPARISON:  CT lumbar myelography 01/19/2014  FINDINGS: CT THORACIC SPINE FINDINGS  Twelve pairs of ribs.  Diffuse osseous demineralization.  Intraspinal stimulator lead at inferior T7 to superior T10.  Scattered disc space narrowing and endplate spur formation throughout thoracic spine.  No acute fracture or subluxation.  No bone destruction seen to suggest vertebral osteomyelitis or discitis.  Dependent atelectasis in both lungs.  No abnormal paraspinal fluid collections.  No definite intraspinal abnormalities identified by noncontrast CT.  CT LUMBAR SPINE FINDINGS  Osseous demineralization.  Five non-rib-bearing lumbar vertebrae.  Prior L3-L4 posterior fusion and L4 laminectomy with intact hardware.  Superior endplate concavity at L4.  Vertebral body heights otherwise maintained without fracture or additional subluxation.  Multilevel facet degenerative changes lumbar spine.  No bone destruction or paraspinal fluid collections identified to suggest osteomyelitis or discitis.  L1-L2:  No abnormalities  L2-L3: Mild diffuse disc bulge. Ligamentum flavum thickening. No focal disc herniation or neural compression. AP narrowing of spinal canal, multifactorial.   L3-L4: Foramina grossly patent. No disc herniation or neural compression.  L4-L5: Significant artifacts from lumbar hardware and intraspinal stimulator generator pack. Diffuse disc bulge extending into LEFT neural foramen without compression of nerve root.  L5-S1: Retrolisthesis with disc space narrowing, vacuum phenomenon, endplate spurs and pseudo disc versus calcified disc fragment, narrowing AP diameter of spinal canal. No definite compression of the S1 roots as they enter the lateral recesses. Foramina patent.  BILATERAL hydronephrosis LEFT greater than RIGHT secondary to UPJ calculi, 9 x 6 mm LEFT and 7 x 8 mm RIGHT.  Additional BILATERAL renal calculi up to 10 mm  diameter RIGHT and 11 mm diameter LEFT.  No ureteral dilatation.  Calcified leiomyoma in uterus.  Sigmoid diverticulosis.  No paraspinal fluid collections.  IMPRESSION: CT THORACIC SPINE IMPRESSION  Multilevel degenerative disc and facet disease changes.  Osseous demineralization.  No acute thoracic spine abnormalities.  CT LUMBAR SPINE IMPRESSION  Prior L3-L4 fusion.  Multilevel degenerative disc and facet disease changes.  Bulging L4-L5 disc extending into LEFT neural foramen without neural compression.  Retrolisthesis with pseudo disc, endplate spurs and question focal disc herniation at L5-S1, causing no neural compression.  Multiple BILATERAL renal calculi as above with associated BILATERAL hydronephrosis LEFT greater than RIGHT due to large UPJ calculi bilaterally.  Sigmoid diverticulosis.  Findings called to [facility] PA on 03/25/2014 at 1550 hr.   Electronically Signed   By: Lavonia Dana M.D.   On: 03/25/2014 15:54    EKG: Independently reviewed. Sinus, no ST-T changes    Time spent:60 minutes Code Status:   FULL Family Communication:   Daughter updated at bedside   Starlene Consuegra, DO  [facility] Pager [phone]  If 7PM-7AM, please contact night-coverage [email or web address] Password [email or web address] 03/25/2014, 5:13 PM

## 2014-03-25 NOTE — Consult Note (Signed)
Urology Consult   Physician requesting consult: Dr. Carles Collet  Reason for consult: Bilateral ureteral stones, sepsis  History of Present Illness: Kimberly Larsen is a 68 y.o. with a distant history of urolithiasis with a history of SWL and ureteroscopic treatment in the early 1990s.  She has a history of chronic back pain and DDD s/p implantation of spinal cord stimulator in late October for pain management after multiple back surgeries in the past.  She states that she did not left upper back/flank pain that began 3 weeks ago and has been intermittent.  She saw her PCP and was treated with Cipro for a presumed UTI with some improvement of her symptoms.  However, she developed left sided flank and upper quadrant abdominal pain that was more severe and associated with fever and chills 48 hours ago.  She presented to the ED today with worsening weakness and fever.  She had a CT scan of her spine and had bilateral hydronephrosis with bilateral proximal ureteral stones.  She was noted to have AKI with a Cr over 3.  UA was normal.  She has been off Cipro for about one week.   Past Medical History  Diagnosis Date  . Hypertension   . Arthritis   . HLD (hyperlipidemia)   . History of kidney stones   . Chronic kidney disease   . Anxiety     Past Surgical History  Procedure Laterality Date  . Tubal ligation    . Ganglion cyst excision      L ankle  . Rhinoplasty    . Lithotripsy    . Carpal tunnel release      R hand  . Back surgery  2013  . Total knee arthroplasty Right 08/31/2012    Procedure: RIGHT TOTAL KNEE ARTHROPLASTY;  Surgeon: Mauri Pole, MD;  Location: [facility];  Service: Orthopedics;  Laterality: Right;  with LMA  . Joint replacement      total knee  . Spinal cord stimulator insertion N/A 02/23/2014    Procedure: SPINAL CORD STIMULATOR PLACEMENT ;  Surgeon: Melina Schools, MD;  Location: [place];  Service: Orthopedics;  Laterality: N/A;     Current Hospital Medications:  Home meds:     Medication List    ASK your doctor about these medications        aspirin EC 81 MG tablet  Take 81 mg by mouth daily.     Biotin 5000 MCG Tabs  Take 2 tablets by mouth daily.     citalopram 40 MG tablet  Commonly known as:  CELEXA  Take 1 tablet by mouth daily.     Fish Oil 600 MG Caps  Take 1 capsule by mouth 2 (two) times daily.     gabapentin 600 MG tablet  Commonly known as:  NEURONTIN  Take 600 mg by mouth 3 (three) times daily.     loratadine 10 MG tablet  Commonly known as:  CLARITIN  Take 10 mg by mouth daily as needed for allergies.     Melatonin 10 MG Caps  Take 10 mg by mouth at bedtime.     methocarbamol 500 MG tablet  Commonly known as:  ROBAXIN  Take 1 tablet (500 mg total) by mouth 3 (three) times daily as needed for muscle spasms.     multivitamin with minerals Tabs tablet  Take 1 tablet by mouth daily.     ondansetron 4 MG tablet  Commonly known as:  ZOFRAN  Take 1 tablet (4  mg total) by mouth every 8 (eight) hours as needed for nausea or vomiting.     oxyCODONE-acetaminophen 10-325 MG per tablet  Commonly known as:  PERCOCET  Take 1 tablet by mouth every 4 (four) hours as needed for pain.     pravastatin 40 MG tablet  Commonly known as:  PRAVACHOL  Take 1 tablet (40 mg total) by mouth at bedtime.     quinapril-hydrochlorothiazide 20-12.5 MG per tablet  Commonly known as:  ACCURETIC  Take 1 tablet by mouth 2 (two) times daily.     SUPER B COMPLEX Tabs  Take 1 tablet by mouth daily.     traZODone 50 MG tablet  Commonly known as:  DESYREL  Take 0.5-1 tablets (25-50 mg total) by mouth at bedtime.     vitamin C 500 MG tablet  Commonly known as:  ASCORBIC ACID  Take 500 mg by mouth daily.     Zinc 50 MG Caps  Take 1 capsule (50 mg total) by mouth daily.        Scheduled Meds:  Continuous Infusions: . piperacillin-tazobactam (ZOSYN)  IV    . [START ON 03/26/2014] vancomycin     PRN Meds:.  Allergies: No Known Allergies  No  family history on file.  Social History:  reports that she has never smoked. She does not have any smokeless tobacco history on file. She reports that she does not drink alcohol or use illicit drugs.  ROS: A complete review of systems was performed.  All systems are negative except for pertinent findings as noted.  Physical Exam:  Vital signs in last 24 hours: Temp:  [98.3 F (36.8 C)-101.5 F (38.6 C)] 98.3 F (36.8 C) (11/27 1338) Pulse Rate:  [62-82] 82 (11/27 1600) Resp:  [11-20] 16 (11/27 1600) BP: (84-144)/(40-60) 144/55 mmHg (11/27 1600) SpO2:  [87 %-100 %] 98 % (11/27 1600) Weight:  [89.359 kg (197 lb)] 89.359 kg (197 lb) (11/27 1338) Constitutional:  Alert and oriented, No acute distress Cardiovascular: Regular rate and rhythm, No JVD Respiratory: Normal respiratory effort, Lungs clear bilaterally GI: Abdomen is soft, nondistended, no abdominal masses, she has left and right upper quadrant pain without rebound tenderness or guarding. GU: Bilateral severe CVAT. Lymphatic: No lymphadenopathy Neurologic: Grossly intact, no focal deficits Psychiatric: Normal mood and affect  Laboratory Data:   Recent Labs  03/25/14 1200  WBC 13.6*  HGB 9.4*  HCT 29.7*  PLT 226     Recent Labs  03/25/14 1200  NA 137  K 4.8  CL 99  GLUCOSE 175*  BUN 45*  CALCIUM 8.6  CREATININE 3.10*     Results for orders placed or performed during the hospital encounter of 03/25/14 (from the past 24 hour(s))  Urinalysis, Routine w reflex microscopic     Status: Abnormal   Collection Time: 03/25/14 11:42 AM  Result Value Ref Range   Color, Urine YELLOW YELLOW   APPearance CLEAR CLEAR   Specific Gravity, Urine 1.008 1.005 - 1.030   pH 7.0 5.0 - 8.0   Glucose, UA NEGATIVE NEGATIVE mg/dL   Hgb urine dipstick NEGATIVE NEGATIVE   Bilirubin Urine NEGATIVE NEGATIVE   Ketones, ur NEGATIVE NEGATIVE mg/dL   Protein, ur NEGATIVE NEGATIVE mg/dL   Urobilinogen, UA 0.2 0.0 - 1.0 mg/dL   Nitrite  NEGATIVE NEGATIVE   Leukocytes, UA SMALL (A) NEGATIVE  Urine microscopic-add on     Status: None   Collection Time: 03/25/14 11:42 AM  Result Value Ref Range   WBC,  UA 0-2 <3 WBC/hpf  Comprehensive metabolic panel     Status: Abnormal   Collection Time: 03/25/14 12:00 PM  Result Value Ref Range   Sodium 137 137 - 147 mEq/L   Potassium 4.8 3.7 - 5.3 mEq/L   Chloride 99 96 - 112 mEq/L   CO2 26 19 - 32 mEq/L   Glucose, Bld 175 (H) 70 - 99 mg/dL   BUN 45 (H) 6 - 23 mg/dL   Creatinine, Ser 3.10 (H) 0.50 - 1.10 mg/dL   Calcium 8.6 8.4 - 10.5 mg/dL   Total Protein 5.4 (L) 6.0 - 8.3 g/dL   Albumin 2.7 (L) 3.5 - 5.2 g/dL   AST 28 0 - 37 U/L   ALT 14 0 - 35 U/L   Alkaline Phosphatase 79 39 - 117 U/L   Total Bilirubin 0.4 0.3 - 1.2 mg/dL   GFR calc non Af Amer 15 (L) >90 mL/min   GFR calc Af Amer 17 (L) >90 mL/min   Anion gap 12 5 - 15  CBC with Differential     Status: Abnormal   Collection Time: 03/25/14 12:00 PM  Result Value Ref Range   WBC 13.6 (H) 4.0 - 10.5 K/uL   RBC 3.38 (L) 3.87 - 5.11 MIL/uL   Hemoglobin 9.4 (L) 12.0 - 15.0 g/dL   HCT 29.7 (L) 36.0 - 46.0 %   MCV 87.9 78.0 - 100.0 fL   MCH 27.8 26.0 - 34.0 pg   MCHC 31.6 30.0 - 36.0 g/dL   RDW 15.2 11.5 - 15.5 %   Platelets 226 150 - 400 K/uL   Neutrophils Relative % 88 (H) 43 - 77 %   Neutro Abs 12.0 (H) 1.7 - 7.7 K/uL   Lymphocytes Relative 5 (L) 12 - 46 %   Lymphs Abs 0.6 (L) 0.7 - 4.0 K/uL   Monocytes Relative 7 3 - 12 %   Monocytes Absolute 0.9 0.1 - 1.0 K/uL   Eosinophils Relative 0 0 - 5 %   Eosinophils Absolute 0.0 0.0 - 0.7 K/uL   Basophils Relative 0 0 - 1 %   Basophils Absolute 0.0 0.0 - 0.1 K/uL  I-Stat CG4 Lactic Acid, ED     Status: None   Collection Time: 03/25/14 12:25 PM  Result Value Ref Range   Lactic Acid, Venous 1.24 0.5 - 2.2 mmol/L  I-Stat CG4 Lactic Acid, ED     Status: None   Collection Time: 03/25/14  3:40 PM  Result Value Ref Range   Lactic Acid, Venous 0.61 0.5 - 2.2 mmol/L   No  results found for this or any previous visit (from the past 240 hour(s)).  Renal Function:  Recent Labs  03/25/14 1200  CREATININE 3.10*   Estimated Creatinine Clearance: 18.5 mL/min (by C-G formula based on Cr of 3.1).  Radiologic Imaging: Dg Chest 2 View  03/25/2014   CLINICAL DATA:  Fever since earlier this morning.  EXAM: CHEST  2 VIEW  COMPARISON:  02/21/2014.  FINDINGS: Normal sized heart. Stable linear scarring at the left lung base. Interval neural stimulator leads in the lower thoracic spinal canal. Diffuse osteopenia. Thoracic spine degenerative changes.  IMPRESSION: No active cardiopulmonary disease.   Electronically Signed   By: Enrique Sack M.D.   On: 03/25/2014 10:44   Ct Thoracic Spine Wo Contrast  03/25/2014   CLINICAL DATA:  Sepsis, mid to low back pain, onset of weakness at 1300 hr yesterday, fever, nausea and dizziness with walking  EXAM: CT THORACIC  AND LUMBAR SPINE WITHOUT CONTRAST  TECHNIQUE: Multidetector CT imaging of the thoracic and lumbar spine was performed without contrast. Multiplanar CT image reconstructions were also generated.  COMPARISON:  CT lumbar myelography 01/19/2014  FINDINGS: CT THORACIC SPINE FINDINGS  Twelve pairs of ribs.  Diffuse osseous demineralization.  Intraspinal stimulator lead at inferior T7 to superior T10.  Scattered disc space narrowing and endplate spur formation throughout thoracic spine.  No acute fracture or subluxation.  No bone destruction seen to suggest vertebral osteomyelitis or discitis.  Dependent atelectasis in both lungs.  No abnormal paraspinal fluid collections.  No definite intraspinal abnormalities identified by noncontrast CT.  CT LUMBAR SPINE FINDINGS  Osseous demineralization.  Five non-rib-bearing lumbar vertebrae.  Prior L3-L4 posterior fusion and L4 laminectomy with intact hardware.  Superior endplate concavity at L4.  Vertebral body heights otherwise maintained without fracture or additional subluxation.  Multilevel facet  degenerative changes lumbar spine.  No bone destruction or paraspinal fluid collections identified to suggest osteomyelitis or discitis.  L1-L2:  No abnormalities  L2-L3: Mild diffuse disc bulge. Ligamentum flavum thickening. No focal disc herniation or neural compression. AP narrowing of spinal canal, multifactorial.  L3-L4: Foramina grossly patent. No disc herniation or neural compression.  L4-L5: Significant artifacts from lumbar hardware and intraspinal stimulator generator pack. Diffuse disc bulge extending into LEFT neural foramen without compression of nerve root.  L5-S1: Retrolisthesis with disc space narrowing, vacuum phenomenon, endplate spurs and pseudo disc versus calcified disc fragment, narrowing AP diameter of spinal canal. No definite compression of the S1 roots as they enter the lateral recesses. Foramina patent.  BILATERAL hydronephrosis LEFT greater than RIGHT secondary to UPJ calculi, 9 x 6 mm LEFT and 7 x 8 mm RIGHT.  Additional BILATERAL renal calculi up to 10 mm diameter RIGHT and 11 mm diameter LEFT.  No ureteral dilatation.  Calcified leiomyoma in uterus.  Sigmoid diverticulosis.  No paraspinal fluid collections.  IMPRESSION: CT THORACIC SPINE IMPRESSION  Multilevel degenerative disc and facet disease changes.  Osseous demineralization.  No acute thoracic spine abnormalities.  CT LUMBAR SPINE IMPRESSION  Prior L3-L4 fusion.  Multilevel degenerative disc and facet disease changes.  Bulging L4-L5 disc extending into LEFT neural foramen without neural compression.  Retrolisthesis with pseudo disc, endplate spurs and question focal disc herniation at L5-S1, causing no neural compression.  Multiple BILATERAL renal calculi as above with associated BILATERAL hydronephrosis LEFT greater than RIGHT due to large UPJ calculi bilaterally.  Sigmoid diverticulosis.  Findings called to [facility] on 03/25/2014 at 1550 hr.   Electronically Signed   By: Lavonia Dana M.D.   On: 03/25/2014 15:54      I  independently reviewed the above imaging studies.  Impression/Recommendation: 1) Bilateral ureteral stones/obstruction with sepsis and AKI: I recommended proceeding to the OR immediately for cystoscopy and bilateral ureteral stent placement.  She has had cultures of the urine and blood sent and has started broad spectrum IV antibiotic therapy with Zosyn and Vancomycin.  I spoke with Dr. Carles Collet and he plans to have her admitted to the [facility] ICU per the Hospitalist service.  She will go to the OR as soon as she gets to [facility] for her procedure. I discussed the potential benefits and risks of the procedure, side effects of the proposed treatment, the likelihood of the patient achieving the goals of the procedure, and any potential problems that might occur during the procedure or recuperation. She gives her informed consent.  Brandii Lakey,LES 03/25/2014, 5:20 PM  Wynetta Emery  Hollice Espy. MD   CC: Dr. Shanon Brow Tat

## 2014-03-25 NOTE — Progress Notes (Signed)
eLink Physician-Brief Progress Note Patient Name: Kimberly Larsen DOB: [birth year] MRN: [id number]   Date of Service  03/25/2014  HPI/Events of Note  Consult received from urology Severe sepsis, AKI, bilateral stones Bilateral uretal stents placed in OR today Failed SBT Concern that sepsis may worsen In PACU she had severe hypoxemia, hypertension  eICU Interventions  Bedside MD to evaluate Will place vent/sedation orders and will order CXR and basic labs RASS goal -1, intermittent fentanyl     Intervention Category Evaluation Type: New Patient Evaluation  Delorse Shane 03/25/2014, 7:52 PM

## 2014-03-25 NOTE — ED Notes (Signed)
Patient to ED with onset of weakness at 1300 yesterday.  Patient febrile - T - 101.6 per EMS.  Patient C/O weakness, nausea and dizziness when she walks.  Denies pain, vomiting and visual changes.

## 2014-03-25 NOTE — Progress Notes (Signed)
[place] Progress Note Patient Name: ROMEKA SCIFRES DOB: June 16, 1945 MRN: [id number]   Date of Service  03/25/2014  HPI/Events of Note  Pt hypotensive post op stone removal   eICU Interventions  Ns bolus, levophed ordered ,will need CVL     Intervention Category Major Interventions: Hypotension - evaluation and management  Asencion Noble 03/25/2014, 10:03 PM

## 2014-03-25 NOTE — ED Notes (Signed)
Dr.J was informed of patient V/S.

## 2014-03-25 NOTE — Progress Notes (Addendum)
ANTIBIOTIC CONSULT NOTE - INITIAL  Pharmacy Consult for vancomycin/zosyn Indication: UTI/sepsis  No Known Allergies  Patient Measurements: Height: 5\' 2"  (157.5 cm) Weight: 197 lb (89.359 kg) IBW/kg (Calculated) : 50.1 Adjusted Body Weight:   Vital Signs: Temp: 98.3 F (36.8 C) (11/27 1338) Temp Source: Oral (11/27 1338) BP: 149/68 mmHg (11/27 1730) Pulse Rate: 100 (11/27 1730) Intake/Output from previous day:   Intake/Output from this shift: Total I/O In: 300 [I.V.:300] Out: -   Labs:  Recent Labs  03/25/14 1200  WBC 13.6*  HGB 9.4*  PLT 226  CREATININE 3.10*   Estimated Creatinine Clearance: 18.5 mL/min (by C-G formula based on Cr of 3.1). No results for input(s): VANCOTROUGH, VANCOPEAK, VANCORANDOM, GENTTROUGH, GENTPEAK, GENTRANDOM, TOBRATROUGH, TOBRAPEAK, TOBRARND, AMIKACINPEAK, AMIKACINTROU, AMIKACIN in the last 72 hours.   Microbiology: No results found for this or any previous visit (from the past 720 hour(s)).  Medical History: Past Medical History  Diagnosis Date  . Hypertension   . Arthritis   . HLD (hyperlipidemia)   . History of kidney stones   . Chronic kidney disease   . Anxiety    Assessment: 68 YOF presents with sepsis likely urinary source.  She is s/p BL stent for BL utereral calculi.  She is noted to have ARI with SCr > 3 on admission.  She failed SBT post-operatively with severe hypoxemia and remains with VDRF. Vancomycin and zosyn ordered.    She was given  zosyn dose at 1047 11/27, vancomycin 1gm at 1120  11/27 >> vancomycin  >> 11/27  >> zosyn  >>    Tmax: WBCs: slightly elevated Renal: ARI - normalized CrCl = 32ml/min  11/27 urine:  11/27 influenza panel:   Goal of Therapy:  Vancomycin trough level 15-20 mcg/ml  Plan:   Vancomycin 1gm IV x 1 for total of 2gm loading dose to achieve estimated peak of 11mcg/ml  Follow renal function in am and start maintenance when appropriate  Change zosyn to 2.25gm IV q6h for CrCl < 20  ml/min  Doreene Eland, PharmD, BCPS.   Pager: [phone]  03/25/2014,8:00 PM

## 2014-03-25 NOTE — Consult Note (Signed)
PULMONARY / CRITICAL CARE MEDICINE   Name: Kimberly Larsen MRN: [id number] DOB: 11-18-1945    ADMISSION DATE:  03/25/2014 CONSULTATION DATE:  03/25/2014   REFERRING MD :  [facility]  CHIEF COMPLAINT:  Post op acute respiratory failure and hypotension  SIGNIFICANT EVENTS: 03/25/2014 - admit ,. Post op intubated and to ICU. 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    Patient stated that she has had left upper quadrant abdominal pain for approximately 2 weeks. She went to see her primary care provider on 03/11/2014. The patient was diagnosed with a UTI and started on ciprofloxacin, the last dose of which she took on 03/19/2014. She presented to ER 03/25/2014 with 48h worsening fever, chills pon arrival to emergency department, the patient was noted to be hypotensive with systolic blood pressure of 84. She had a fever 101.96F. Serum creatinine was noted to be 3.10 with a WBC count of 13.6. Blood cultures and urine cultures were obtained and the patient was started on empiric vancomycin and Zosyn. Lactic acid was 1.24.CT of the thoracic and lumbar spine revealed bilateral hydronephrosis with bilateral obstructing UPJ stones-. This was associate with AKI creat 3mg %  She underwent emergent  Cystoscopy and bilateral ureteral stent placement . Per reports she was on neosynephrine via PIV during procedure but returned intubated and normotensive/;hypertensive to ICU. She was left intubated over concerns of worsening sepsis. After transfer to ICU she developed recurrering hypotension/circulatory shock  needing pressors/levophed 73mcg via PIV despite total 3.5L since arrival to ICU.   PCCM now primary service  Note: CREAT 3mg  - also on ACE inhibitor and NEURONTIN  PAST MEDICAL HISTORY :   has a past medical history of Hypertension; Arthritis; HLD (hyperlipidemia); History of kidney stones; Chronic kidney disease; and Anxiety.  has past surgical history that includes Tubal ligation; Ganglion cyst excision; Rhinoplasty; Lithotripsy; Carpal tunnel release; Back surgery (2013); Total knee arthroplasty (Right, 08/31/2012); Joint replacement; and Spinal cord stimulator insertion (N/A, 02/23/2014). Prior to Admission medications   Medication Sig Start Date End Date Taking? Authorizing Provider  aspirin EC 81 MG tablet Take 81 mg by mouth daily.   Yes Historical Provider, MD  B Complex-C (SUPER B COMPLEX) TABS Take 1 tablet by mouth daily. 09/11/12  Yes Judeth Cornfield, NP  Biotin 5000 MCG TABS Take 2 tablets by mouth daily. 09/11/12  Yes Judeth Cornfield, NP  citalopram (CELEXA) 40 MG tablet Take 1 tablet by mouth daily. 02/08/14  Yes Historical Provider, MD  gabapentin (NEURONTIN) 600 MG tablet Take 600 mg by mouth 3 (three) times daily.   Yes Historical Provider, MD  loratadine (CLARITIN) 10 MG tablet Take 10 mg by mouth daily as needed for allergies.    Yes Historical Provider, MD  Melatonin 10 MG CAPS Take 10 mg by mouth at bedtime.   Yes Historical Provider, MD  methocarbamol (ROBAXIN) 500 MG tablet Take 1 tablet (500 mg total) by mouth 3 (three) times daily as needed for muscle spasms. 02/24/14  Yes Melina Schools, MD  Multiple Vitamin (MULTIVITAMIN WITH MINERALS) TABS Take 1 tablet by mouth daily. 09/11/12  Yes Edwina  Gwenlyn Found, NP  Omega-3 Fatty Acids (FISH OIL) 600 MG CAPS Take 1 capsule by mouth 2 (two) times daily. 09/11/12  Yes Judeth Cornfield, NP  ondansetron (ZOFRAN) 4 MG tablet Take 1 tablet (4 mg total) by mouth every 8 (eight) hours as needed for nausea or vomiting. 02/24/14  Yes Melina Schools, MD   oxyCODONE-acetaminophen (PERCOCET) 10-325 MG per tablet Take 1 tablet by mouth every 4 (four) hours as needed for pain. 02/24/14  Yes Melina Schools, MD  pravastatin (PRAVACHOL) 40 MG tablet Take 1 tablet (40 mg total) by mouth at bedtime. 09/11/12  Yes Judeth Cornfield, NP  quinapril-hydrochlorothiazide (ACCURETIC) 20-12.5 MG per tablet Take 1 tablet by mouth 2 (two) times daily. 09/11/12  Yes Judeth Cornfield, NP  traZODone (DESYREL) 50 MG tablet Take 0.5-1 tablets (25-50 mg total) by mouth at bedtime. 09/11/12  Yes Judeth Cornfield, NP  vitamin C (ASCORBIC ACID) 500 MG tablet Take 500 mg by mouth daily.   Yes Historical Provider, MD  Zinc 50 MG CAPS Take 1 capsule (50 mg total) by mouth daily. 09/11/12  Yes Judeth Cornfield, NP   No Known Allergies  FAMILY HISTORY:  has no family status information on file.  SOCIAL HISTORY:  reports that she has never smoked. She does not have any smokeless tobacco history on file. She reports that she does not drink alcohol or use illicit drugs.  REVIEW OF SYSTEMS:  PerHPI. Rest unelicitable due to intubated status    VITAL SIGNS: Temp:  [98.3 F (36.8 C)-101.5 F (38.6 C)] 98.3 F (36.8 C) (11/27 1338) Pulse Rate:  [62-115] 93 (11/27 2130) Resp:  [11-20] 19 (11/27 2130) BP: (84-162)/(35-68) 109/38 mmHg (11/27 2130) SpO2:  [87 %-100 %] 97 % (11/27 2130) FiO2 (%):  [60 %-100 %] 60 % (11/27 2108) Weight:  [89.359 kg (197 lb)] 89.359 kg (197 lb) (11/27 1338) HEMODYNAMICS:   VENTILATOR SETTINGS: Vent Mode:  [-] PRVC FiO2 (%):  [60 %-100 %] 60 % Set Rate:  [12 bmp-18 bmp] 18 bmp Vt Set:  [400 mL-500 mL] 500 mL PEEP:  [5 cmH20] 5 cmH20 Plateau Pressure:  [9 cmH20-21 cmH20] 21 cmH20 INTAKE / OUTPUT:  Intake/Output Summary (Last 24 hours) at 03/25/14 2251 Last data filed at 03/25/14 1944  Gross per 24 hour  Intake    300 ml  Output    400 ml  Net   -100 ml    PHYSICAL EXAMINATION: General:  Intubated, in ICU status Neuro:  Overall calm and then occ  agitated. Follows commands. RASS +2 to -1  HEENT:  ETT + Cardiovascular:  Normal heart sounds Lungs:   On vent, synch with vent +, CTA bilaterally Abdomen:  Obese, soft Musculoskeletal:  No cyanosis, No clubbing. No edema Skin:  intact  LABS:  PULMONARY  Recent Labs Lab 03/25/14 2050 03/25/14 2215  PHART 7.255* 7.448  PCO2ART 61.0* 34.2*  PO2ART 170.0* 176.0*  HCO3 25.4* 23.3  TCO2 24.0 21.6  O2SAT 98.8 99.6    CBC  Recent Labs Lab 03/25/14 1200 03/25/14 2020  HGB 9.4* 11.6*  HCT 29.7* 36.9  WBC 13.6* 11.3*  PLT 226 212    COAGULATION No results for input(s): INR in the last 168 hours.  CARDIAC  No results for input(s): TROPONINI in the last 168 hours.  Recent Labs Lab 03/25/14 2020  PROBNP 2305.0*     CHEMISTRY  Recent Labs Lab 03/25/14 1200 03/25/14 2020  NA 137 139  K 4.8 4.4  CL 99 101  CO2 26  27  GLUCOSE 175* 129*  BUN 45* 41*  CREATININE 3.10* 3.09*  CALCIUM 8.6 8.6   Estimated Creatinine Clearance: 18.6 mL/min (by C-G formula based on Cr of 3.09).   LIVER  Recent Labs Lab 03/25/14 1200 03/25/14 2020  AST 28 53*  ALT 14 29  ALKPHOS 79 98  BILITOT 0.4 0.5  PROT 5.4* 6.8  ALBUMIN 2.7* 3.1*     INFECTIOUS  Recent Labs Lab 03/25/14 1225 03/25/14 1540 03/25/14 2020  LATICACIDVEN 1.24 0.61 1.2     ENDOCRINE CBG (last 3)  No results for input(s): GLUCAP in the last 72 hours.       IMAGING x48h Dg Chest 2 View  03/25/2014   CLINICAL DATA:  Fever since earlier this morning.  EXAM: CHEST  2 VIEW  COMPARISON:  02/21/2014.  FINDINGS: Normal sized heart. Stable linear scarring at the left lung base. Interval neural stimulator leads in the lower thoracic spinal canal. Diffuse osteopenia. Thoracic spine degenerative changes.  IMPRESSION: No active cardiopulmonary disease.   Electronically Signed   By: Enrique Sack M.D.   On: 03/25/2014 10:44   Ct Thoracic Spine Wo Contrast  03/25/2014   CLINICAL DATA:  Sepsis, mid to  low back pain, onset of weakness at 1300 hr yesterday, fever, nausea and dizziness with walking  EXAM: CT THORACIC AND LUMBAR SPINE WITHOUT CONTRAST  TECHNIQUE: Multidetector CT imaging of the thoracic and lumbar spine was performed without contrast. Multiplanar CT image reconstructions were also generated.  COMPARISON:  CT lumbar myelography 01/19/2014  FINDINGS: CT THORACIC SPINE FINDINGS  Twelve pairs of ribs.  Diffuse osseous demineralization.  Intraspinal stimulator lead at inferior T7 to superior T10.  Scattered disc space narrowing and endplate spur formation throughout thoracic spine.  No acute fracture or subluxation.  No bone destruction seen to suggest vertebral osteomyelitis or discitis.  Dependent atelectasis in both lungs.  No abnormal paraspinal fluid collections.  No definite intraspinal abnormalities identified by noncontrast CT.  CT LUMBAR SPINE FINDINGS  Osseous demineralization.  Five non-rib-bearing lumbar vertebrae.  Prior L3-L4 posterior fusion and L4 laminectomy with intact hardware.  Superior endplate concavity at L4.  Vertebral body heights otherwise maintained without fracture or additional subluxation.  Multilevel facet degenerative changes lumbar spine.  No bone destruction or paraspinal fluid collections identified to suggest osteomyelitis or discitis.  L1-L2:  No abnormalities  L2-L3: Mild diffuse disc bulge. Ligamentum flavum thickening. No focal disc herniation or neural compression. AP narrowing of spinal canal, multifactorial.  L3-L4: Foramina grossly patent. No disc herniation or neural compression.  L4-L5: Significant artifacts from lumbar hardware and intraspinal stimulator generator pack. Diffuse disc bulge extending into LEFT neural foramen without compression of nerve root.  L5-S1: Retrolisthesis with disc space narrowing, vacuum phenomenon, endplate spurs and pseudo disc versus calcified disc fragment, narrowing AP diameter of spinal canal. No definite compression of the S1  roots as they enter the lateral recesses. Foramina patent.  BILATERAL hydronephrosis LEFT greater than RIGHT secondary to UPJ calculi, 9 x 6 mm LEFT and 7 x 8 mm RIGHT.  Additional BILATERAL renal calculi up to 10 mm diameter RIGHT and 11 mm diameter LEFT.  No ureteral dilatation.  Calcified leiomyoma in uterus.  Sigmoid diverticulosis.  No paraspinal fluid collections.  IMPRESSION: CT THORACIC SPINE IMPRESSION  Multilevel degenerative disc and facet disease changes.  Osseous demineralization.  No acute thoracic spine abnormalities.  CT LUMBAR SPINE IMPRESSION  Prior L3-L4 fusion.  Multilevel degenerative disc and facet disease changes.  Bulging  L4-L5 disc extending into LEFT neural foramen without neural compression.  Retrolisthesis with pseudo disc, endplate spurs and question focal disc herniation at L5-S1, causing no neural compression.  Multiple BILATERAL renal calculi as above with associated BILATERAL hydronephrosis LEFT greater than RIGHT due to large UPJ calculi bilaterally.  Sigmoid diverticulosis.  Findings called to [facility] on 03/25/2014 at 1550 hr.   Electronically Signed   By: Lavonia Dana M.D.   On: 03/25/2014 15:54   Ct Lumbar Spine Wo Contrast  03/25/2014   CLINICAL DATA:  Sepsis, mid to low back pain, onset of weakness at 1300 hr yesterday, fever, nausea and dizziness with walking  EXAM: CT THORACIC AND LUMBAR SPINE WITHOUT CONTRAST  TECHNIQUE: Multidetector CT imaging of the thoracic and lumbar spine was performed without contrast. Multiplanar CT image reconstructions were also generated.  COMPARISON:  CT lumbar myelography 01/19/2014  FINDINGS: CT THORACIC SPINE FINDINGS  Twelve pairs of ribs.  Diffuse osseous demineralization.  Intraspinal stimulator lead at inferior T7 to superior T10.  Scattered disc space narrowing and endplate spur formation throughout thoracic spine.  No acute fracture or subluxation.  No bone destruction seen to suggest vertebral osteomyelitis or discitis.   Dependent atelectasis in both lungs.  No abnormal paraspinal fluid collections.  No definite intraspinal abnormalities identified by noncontrast CT.  CT LUMBAR SPINE FINDINGS  Osseous demineralization.  Five non-rib-bearing lumbar vertebrae.  Prior L3-L4 posterior fusion and L4 laminectomy with intact hardware.  Superior endplate concavity at L4.  Vertebral body heights otherwise maintained without fracture or additional subluxation.  Multilevel facet degenerative changes lumbar spine.  No bone destruction or paraspinal fluid collections identified to suggest osteomyelitis or discitis.  L1-L2:  No abnormalities  L2-L3: Mild diffuse disc bulge. Ligamentum flavum thickening. No focal disc herniation or neural compression. AP narrowing of spinal canal, multifactorial.  L3-L4: Foramina grossly patent. No disc herniation or neural compression.  L4-L5: Significant artifacts from lumbar hardware and intraspinal stimulator generator pack. Diffuse disc bulge extending into LEFT neural foramen without compression of nerve root.  L5-S1: Retrolisthesis with disc space narrowing, vacuum phenomenon, endplate spurs and pseudo disc versus calcified disc fragment, narrowing AP diameter of spinal canal. No definite compression of the S1 roots as they enter the lateral recesses. Foramina patent.  BILATERAL hydronephrosis LEFT greater than RIGHT secondary to UPJ calculi, 9 x 6 mm LEFT and 7 x 8 mm RIGHT.  Additional BILATERAL renal calculi up to 10 mm diameter RIGHT and 11 mm diameter LEFT.  No ureteral dilatation.  Calcified leiomyoma in uterus.  Sigmoid diverticulosis.  No paraspinal fluid collections.  IMPRESSION: CT THORACIC SPINE IMPRESSION  Multilevel degenerative disc and facet disease changes.  Osseous demineralization.  No acute thoracic spine abnormalities.  CT LUMBAR SPINE IMPRESSION  Prior L3-L4 fusion.  Multilevel degenerative disc and facet disease changes.  Bulging L4-L5 disc extending into LEFT neural foramen without  neural compression.  Retrolisthesis with pseudo disc, endplate spurs and question focal disc herniation at L5-S1, causing no neural compression.  Multiple BILATERAL renal calculi as above with associated BILATERAL hydronephrosis LEFT greater than RIGHT due to large UPJ calculi bilaterally.  Sigmoid diverticulosis.  Findings called to [facility] on 03/25/2014 at 1550 hr.   Electronically Signed   By: Lavonia Dana M.D.   On: 03/25/2014 15:54   US Renal  03/25/2014   CLINICAL DATA:  Acute kidney injury.  EXAM: RENAL/URINARY TRACT ULTRASOUND COMPLETE  COMPARISON:  None.  FINDINGS: Right Kidney:  Length: 12.6  cm. Echogenicity within normal limits. Mild right hydronephrosis is noted.  Left Kidney:  Length: 12.8 cm. Echogenicity within normal limits. Moderate hydronephrosis is noted. Intrarenal calculi are noted.  Bladder:  Decompressed secondary to Foley catheter.  IMPRESSION: Mild right hydronephrosis is noted. Moderate left hydronephrosis is noted with intrarenal calculi. CT urogram is recommended for further evaluation.   Electronically Signed   By: Sabino Dick M.D.   On: 03/25/2014 21:35   Dg Chest Port 1 View  03/25/2014   CLINICAL DATA:  Acute respiratory failure with hypoxia. Intubated patient.  EXAM: PORTABLE CHEST - 1 VIEW  COMPARISON:  02/21/2014  FINDINGS: Endotracheal tube is roughly 3.8 cm above the carina. Neural stimulator device in the lower thoracic spine. Low lung volumes with diffuse interstitial lung densities bilaterally. Findings are suggestive for areas of atelectasis and edema. Heart size is within normal limits. Negative for a pneumothorax.  IMPRESSION: Slightly low lung volumes with evidence for interstitial edema and atelectasis.  Endotracheal tube is appropriately positioned.   Electronically Signed   By: Markus Daft M.D.   On: 03/25/2014 20:31        ASSESSMENT / PLAN:  PULMONARY OETT 03/25/2014  A: acute post op resp failure due to sepsis, uti, hydro P:   Full vent  support  CARDIOVASCULAR CVL - if needed   A: BAseline  - BP on ace inhibitor  - Hyperlipedemia  On aspirin and statin   septic shock at ER and recurrence post op in ICU. Maintaining BP with  P:  Pressors for MAP > 65 (BP MAP doing well with switch out from levophed to neo PIV) If worsens needs central line HOld ace inhibitor and statin Check random cortisol - > if on pressors > 1h start hydrocort -> dc if  > 25  RENAL A:  Post obstructive renal failure - s/p ureteral stents 03/25/2014. Also on nephrotoxic gabapentin and ace inhibitor at home  P:   Fluids Await ATN to improve s/p ureteral stents Maintain MAP > 65 Avoid ace inhibitor and gabapentin  GASTROINTESTINAL A:  NPO P:   OG tube toLIS  HEMATOLOGIC A:   Anemia of critical illness  P:  PRBC for hjgb  <7gm% Lovenox for dvt proph  INFECTIOUS BCx2  UC   MRSA PCR - negative  A:  Septic shock due to stones. uti P:   Zosyn Vanc - need to check with uro about dc vanc due to renal failure and given MRSA pcr negative Check PCT  ENDOCRINE A:  No hx of dm   P:   Check random cortisol - > if on pressors > 1h start hydrocort -> dc if  Cortisol  > 25 Monitor Might needs ssi  NEUROLOGIC A:   Chronic back pain and multiple sx and oct 2015 s/p stimulator by Dr Rolena Infante Chronic CNS meds are  - celexa, neurontin, Robaxin, percocet, trazodone P:   Fent prn Ativan prn given cns polypharmacy Hold home cns meds but restart asp Might need precedex RASS goal: 0 to -2   CODE    Code Status Orders        Start     Ordered   03/25/14 1951  Full code   Continuous     03/25/14 1950       FAMILY  - Updates: no family at bedside   - Inter-disciplinary family meet or Palliative Care meeting due by:  04/01/14    SUMMARY VDRF,  Circulatory shock, ATN, folliwng bialteral hydro following  ureteral stones and s/p stents 03/25/2014      The patient is critically ill with multiple organ systems failure and  requires high complexity decision making for assessment and support, frequent evaluation and titration of therapies, application of advanced monitoring technologies and extensive interpretation of multiple databases.   Critical Care Time devoted to patient care services described in this note is  45  Minutes. This time reflects time of care of this signee Dr Brand Males. This critical care time does not reflect procedure time, or teaching time or supervisory time of PA/NP/Med student/Med Resident etc but could involve care discussion time    Dr. Brand Males, M.D., [facility].C.P Pulmonary and Critical Care Medicine Staff Physician [facility] Pager: [phone], If no answer or between  15:00h - 7:00h: call [phone]  03/25/2014 11:20 PM

## 2014-03-25 NOTE — ED Provider Notes (Signed)
Patient complains of generalized weakness since last night and feeling of "both hands shaking" and left-sided nonradiating low back pain she is otherwise asymptomatic no urinary symptoms denies cough denies abdominal pain denies headache. No treatment prior to coming here. She denies nausea denies vomiting. Patient has chronic low back pain and has spinal cord stimulator in place. Presently alert pleasant cooperative not acutely ill-appearing lungs clear auscultation heart regular rate and rhythm abdomen nondistended nontender back without tenderness or flank tenderness all 4 extremities without redness swelling or tenderness neurovascular intact skin warm dry no rash  Orlie Dakin, MD 03/25/14 1738

## 2014-03-25 NOTE — Op Note (Signed)
Preoperative diagnosis:  1. Bilateral ureteral calculi 2. Acute kidney injury 3. Sepsis   Postoperative diagnosis:  1. Bilateral ureteral calculi 2. Acute kidney injury 3. Sepsis   Procedure:  1. Cystoscopy 2. Bilateral ureteral stent placement (6 x 24)   Surgeon: Roxy Horseman, Brooke Bonito. M.D.  Anesthesia: General  Complications: None  EBL: Minimal  Specimens: None  Indication: Kimberly Larsen is a 68 y.o. patient with fever and developing sepsis, bilateral ureteral obstruction due to calculi, and an acute kidney injury. After reviewing the management options for treatment, she elected to proceed with the above surgical procedure(s). We have discussed the potential benefits and risks of the procedure, side effects of the proposed treatment, the likelihood of the patient achieving the goals of the procedure, and any potential problems that might occur during the procedure or recuperation. Informed consent has been obtained.  Description of procedure:  The patient was taken to the operating room and general anesthesia was induced.  The patient was placed in the dorsal lithotomy position, prepped and draped in the usual sterile fashion, and preoperative antibiotics were administered. A preoperative time-out was performed.   Cystourethroscopy was performed.  The patient's urethra was examined and was normal. The bladder was then systematically examined in its entirety. There was no evidence for any bladder tumors, stones, or other mucosal pathology.    Attention then turned to the left ureteral orifice and a ureteral catheter was used to intubate the ureteral orifice. A 0.38 sensor guidewire was then advanced up the left ureter into the renal pelvis under fluoroscopic guidance. A 6 Fr ureteral catheter was then advanced into the renal pelvis and urine was obtained and sent for culture. The wire was then backloaded through the cystoscope and a ureteral stent was advance over the wire using  Seldinger technique.  The stent was positioned appropriately under fluoroscopic and cystoscopic guidance.  The wire was then removed with an adequate stent curl noted in the renal pelvis as well as in the bladder.  An identical procedure was then performed on the contralateral side without complications.  A culture from the right renal pelvis was also obtained and sent to microbiology.  The bladder was then emptied and the procedure ended. A 16 Fr urethral catheter was placed.  The patient appeared to tolerate the procedure well and without complications.  The patient was transferred to the recovery unit in satisfactory condition. Due to her developing sepsis, she will remain intubated overnight.  I have discussed her situation in detail with both [facility] and [place] who will be primarily managing her care.   Pryor Curia MD

## 2014-03-25 NOTE — ED Provider Notes (Signed)
CSN: [id number]     Arrival date & time 03/25/14  5498 History   First MD Initiated Contact with Patient 03/25/14 0920     Chief Complaint  Patient presents with  . Weakness     (Consider location/radiation/quality/duration/timing/severity/associated sxs/prior Treatment) The history is provided by the patient.     Pt with present with CKD, HTN, HLD p/w 2 days of generalized weakness, fatigue, anorexia, single episodes of nausea and DOE last night.  This morning her hands are "twitchy" and she was dropping things this morning.  This morning rolled out of bed and misjudged the edge, rolling off and falling on the ground.  Had increase in low back pain after this.  Had spinal cord stimulator placed by Dr Rolena Infante 26/41/58 without complications.   Past Medical History  Diagnosis Date  . Hypertension   . Arthritis   . HLD (hyperlipidemia)   . History of kidney stones   . Chronic kidney disease   . Anxiety    Past Surgical History  Procedure Laterality Date  . Tubal ligation    . Ganglion cyst excision      L ankle  . Rhinoplasty    . Lithotripsy    . Carpal tunnel release      R hand  . Back surgery  2013  . Total knee arthroplasty Right 08/31/2012    Procedure: RIGHT TOTAL KNEE ARTHROPLASTY;  Surgeon: Mauri Pole, MD;  Location: [facility];  Service: Orthopedics;  Laterality: Right;  with LMA  . Joint replacement      total knee  . Spinal cord stimulator insertion N/A 02/23/2014    Procedure: SPINAL CORD STIMULATOR PLACEMENT ;  Surgeon: Melina Schools, MD;  Location: [facility];  Service: Orthopedics;  Laterality: N/A;   No family history on file. History  Substance Use Topics  . Smoking status: Never Smoker   . Smokeless tobacco: Not on file  . Alcohol Use: No   OB History    No data available     Review of Systems  All other systems reviewed and are negative.     Allergies  Review of patient's allergies indicates no known allergies.  Home Medications   Prior to  Admission medications   Medication Sig Start Date End Date Taking? Authorizing Provider  aspirin EC 81 MG tablet Take 81 mg by mouth daily.    Historical Provider, MD  B Complex-C (SUPER B COMPLEX) TABS Take 1 tablet by mouth daily. 09/11/12   Judeth Cornfield, NP  Biotin 5000 MCG TABS Take 2 tablets by mouth daily. 09/11/12   Judeth Cornfield, NP  DULoxetine (CYMBALTA) 30 MG capsule Take 30 mg by mouth daily.    Historical Provider, MD  gabapentin (NEURONTIN) 600 MG tablet Take 600 mg by mouth 3 (three) times daily.    Historical Provider, MD  loratadine (CLARITIN) 10 MG tablet Take 10 mg by mouth daily.    Historical Provider, MD  Melatonin 10 MG CAPS Take 10 mg by mouth at bedtime.    Historical Provider, MD  methocarbamol (ROBAXIN) 500 MG tablet Take 1 tablet (500 mg total) by mouth 3 (three) times daily as needed for muscle spasms. 02/24/14   Melina Schools, MD  Multiple Vitamin (MULTIVITAMIN WITH MINERALS) TABS Take 1 tablet by mouth daily. 09/11/12   Judeth Cornfield, NP  Omega-3 Fatty Acids (FISH OIL) 600 MG CAPS Take 1 capsule by mouth 2 (two) times daily. 09/11/12   Judeth Cornfield, NP  ondansetron (ZOFRAN) 4 MG tablet  Take 1 tablet (4 mg total) by mouth every 8 (eight) hours as needed for nausea or vomiting. 02/24/14   Melina Schools, MD  oxyCODONE-acetaminophen (PERCOCET) 10-325 MG per tablet Take 1 tablet by mouth every 4 (four) hours as needed for pain. 02/24/14   Melina Schools, MD  pravastatin (PRAVACHOL) 40 MG tablet Take 1 tablet (40 mg total) by mouth at bedtime. 09/11/12   Judeth Cornfield, NP  quinapril-hydrochlorothiazide (ACCURETIC) 20-12.5 MG per tablet Take 1 tablet by mouth 2 (two) times daily. 09/11/12   Judeth Cornfield, NP  traZODone (DESYREL) 50 MG tablet Take 0.5-1 tablets (25-50 mg total) by mouth at bedtime. 09/11/12   Judeth Cornfield, NP  vitamin C (ASCORBIC ACID) 500 MG tablet Take 500 mg by mouth daily.    Historical Provider, MD  Zinc 50 MG CAPS Take 1 capsule (50 mg total) by mouth daily. 09/11/12    Judeth Cornfield, NP   BP 91/42 mmHg  Pulse 80  Temp(Src) 100.3 F (37.9 C) (Oral)  Resp 20  SpO2 93% Physical Exam  Constitutional: She appears well-developed and well-nourished. No distress.  HENT:  Head: Normocephalic and atraumatic.  Mouth/Throat: Mucous membranes are dry.  Neck: Normal range of motion. Neck supple.  Cardiovascular: Normal rate and regular rhythm.   Pulmonary/Chest: Effort normal and breath sounds normal. No respiratory distress. She has no wheezes. She has no rales.  Abdominal: Soft. She exhibits no distension. There is no tenderness. There is no rebound and no guarding.  Musculoskeletal: She exhibits no edema.  Thoracic spine with overlying healing surgical incision, left lower back also with healing incision.  No erythema, edema, warmth, discharge, or tenderness   Neurological: She is alert.  Skin: She is not diaphoretic.  Nursing note and vitals reviewed.   ED Course  Procedures (including critical care time) Labs Review Labs Reviewed  COMPREHENSIVE METABOLIC PANEL - Abnormal; Notable for the following:    Glucose, Bld 175 (*)    BUN 45 (*)    Creatinine, Ser 3.10 (*)    Total Protein 5.4 (*)    Albumin 2.7 (*)    GFR calc non Af Amer 15 (*)    GFR calc Af Amer 17 (*)    All other components within normal limits  CBC WITH DIFFERENTIAL - Abnormal; Notable for the following:    WBC 13.6 (*)    RBC 3.38 (*)    Hemoglobin 9.4 (*)    HCT 29.7 (*)    Neutrophils Relative % 88 (*)    Neutro Abs 12.0 (*)    Lymphocytes Relative 5 (*)    Lymphs Abs 0.6 (*)    All other components within normal limits  URINALYSIS, ROUTINE W REFLEX MICROSCOPIC - Abnormal; Notable for the following:    Leukocytes, UA SMALL (*)    All other components within normal limits  URINE CULTURE  CULTURE, BLOOD (ROUTINE X 2)  CULTURE, BLOOD (ROUTINE X 2)  URINE MICROSCOPIC-ADD ON  I-STAT CG4 LACTIC ACID, ED  I-STAT CG4 LACTIC ACID, ED    Imaging Review Dg Chest 2  View  03/25/2014   CLINICAL DATA:  Fever since earlier this morning.  EXAM: CHEST  2 VIEW  COMPARISON:  02/21/2014.  FINDINGS: Normal sized heart. Stable linear scarring at the left lung base. Interval neural stimulator leads in the lower thoracic spinal canal. Diffuse osteopenia. Thoracic spine degenerative changes.  IMPRESSION: No active cardiopulmonary disease.   Electronically Signed   By: Enrique Sack M.D.   On: 03/25/2014  10:44     EKG Interpretation   Date/Time:  Friday March 25 2014 09:32:29 EST Ventricular Rate:  75 PR Interval:  161 QRS Duration: 96 QT Interval:  398 QTC Calculation: 444 R Axis:   59 Text Interpretation:  Sinus rhythm No significant change since last  tracing Confirmed by Winfred Leeds  MD, SAM [phone]) on 03/25/2014 9:57:25 AM       9:51 AM Discussed pt with Dr Winfred Leeds.   1:49 PM Discussed pt with Dr Conley Canal, [facility].  Sepsis, renal failure, unknown source.  She requests imaging of patient's back including spinal cord stimulator prior to admission.  Discussed with Dr Winfred Leeds and with radiologist Dr Carlis Abbott who recommends CT thoracic and lumbar spine with contrast.    4:09 PM I spoke with Dr Tat who accepts admission.  Requests call to urology, possible transfer to [facility] or stay at [facility].   MDM   Final diagnoses:  Sepsis    Febrile hypotensive patient with 2 days of fatigue, generalized weakness, anorexia, found to be in renal failure.  Unclear source of infection. UA, CXR negative.  Surgical incision appeared c/d/i.  Labs show acute renal failure, leukocytosis, anemia.    Hospitalist Dr Conley Canal requested further imaging to evaluate surgical hardware, CT negative for hardware concerns but pt found to have bilateral large UPJ ureteral stones.  Pt admitted to [facility], Dr Tat, consult by Dr Alinda Money (urology), transfer and admission to [place], PA-C 03/25/14 [place], MD 03/25/14 [phone]

## 2014-03-25 NOTE — Anesthesia Preprocedure Evaluation (Addendum)
Anesthesia Evaluation  Patient identified by MRN, date of birth, ID band Patient awake    Reviewed: Allergy & Precautions, H&P , Patient's Chart, lab work & pertinent test results, reviewed documented beta blocker date and time   History of Anesthesia Complications Negative for: history of anesthetic complications  Airway Mallampati: III  TM Distance: >3 FB Neck ROM: full    Dental   Pulmonary  breath sounds clear to auscultation        Cardiovascular Exercise Tolerance: Good hypertension, Rhythm:regular Rate:Normal     Neuro/Psych Anxiety  Neuromuscular disease    GI/Hepatic   Endo/Other    Renal/GU Renal disease     Musculoskeletal  (+) Arthritis -, Fibromyalgia -  Abdominal   Peds  Hematology  (+) anemia ,   Anesthesia Other Findings Morbid obesity Chronic pain with new spinal chord stimulator  Reproductive/Obstetrics                            Anesthesia Physical Anesthesia Plan  ASA: IV and emergent  Anesthesia Plan: General ETT   Post-op Pain Management:    Induction: Cricoid pressure planned, Rapid sequence and Intravenous  Airway Management Planned: Oral ETT  Additional Equipment:   Intra-op Plan:   Post-operative Plan:   Informed Consent: I have reviewed the patients History and Physical, chart, labs and discussed the procedure including the risks, benefits and alternatives for the proposed anesthesia with the patient or authorized representative who has indicated his/her understanding and acceptance.   Dental Advisory Given  Plan Discussed with: CRNA and Surgeon  Anesthesia Plan Comments:         Anesthesia Quick Evaluation

## 2014-03-25 NOTE — Progress Notes (Signed)
ANTIBIOTIC CONSULT NOTE - INITIAL  Pharmacy Consult for Vancomycin and Zosyn Indication: rule out sepsis  No Known Allergies  Patient Measurements:   Wt Readings from Last 3 Encounters:  02/21/14 197 lb 3.2 oz (89.449 kg)  08/31/12 195 lb (88.451 kg)  08/26/12 195 lb (88.451 kg)     Vital Signs: Temp: 101.5 F (38.6 C) (11/27 1014) Temp Source: Rectal (11/27 1014) BP: 84/42 mmHg (11/27 1014) Pulse Rate: 80 (11/27 0919) Intake/Output from previous day:   Intake/Output from this shift:    Labs:  Recent Labs  03/25/14 1200  WBC 13.6*  HGB 9.4*  PLT 226  CREATININE 3.10*   CrCl cannot be calculated (Unknown ideal weight.). No results for input(s): VANCOTROUGH, VANCOPEAK, VANCORANDOM, GENTTROUGH, GENTPEAK, GENTRANDOM, TOBRATROUGH, TOBRAPEAK, TOBRARND, AMIKACINPEAK, AMIKACINTROU, AMIKACIN in the last 72 hours.   Microbiology: No results found for this or any previous visit (from the past 720 hour(s)).  Medical History: Past Medical History  Diagnosis Date  . Hypertension   . Arthritis   . HLD (hyperlipidemia)   . History of kidney stones   . Chronic kidney disease   . Anxiety     Medications:  Anti-infectives    Start     Dose/Rate Route Frequency Ordered Stop   03/25/14 1030  piperacillin-tazobactam (ZOSYN) IVPB 3.375 g     3.375 g100 mL/hr over 30 Minutes Intravenous  Once 03/25/14 1020     03/25/14 1030  vancomycin (VANCOCIN) IVPB 1000 mg/200 mL premix     1,000 mg200 mL/hr over 60 Minutes Intravenous  Once 03/25/14 1020       Assessment: 68 year old female presenting with weakness.  She is to continue antibiotic therapy with Vancomycin and Zosyn for sepsis.  Note she has acute renal insufficiency.  Her creatinine clearance is adequate for extended infusion Zosyn.  Goal of Therapy:  Vancomycin trough level 15-20 mcg/ml  Plan:  Vancomycin 1gm IV q24h Zosyn 3.375gm IV q8h extended infusion Monitor renal function closely Follow available micro  data  Legrand Como, Pharm.D., BCPS, AAHIVP Clinical Pharmacist Phone: [phone] or [phone] 03/25/2014, 10:34 AM

## 2014-03-26 ENCOUNTER — Inpatient Hospital Stay (HOSPITAL_COMMUNITY): Payer: Medicare Other

## 2014-03-26 LAB — CBC
HCT: 31.6 % — ABNORMAL LOW (ref 36.0–46.0)
Hemoglobin: 10.1 g/dL — ABNORMAL LOW (ref 12.0–15.0)
MCH: 28.5 pg (ref 26.0–34.0)
MCHC: 32 g/dL (ref 30.0–36.0)
MCV: 89 fL (ref 78.0–100.0)
Platelets: 180 10*3/uL (ref 150–400)
RBC: 3.55 MIL/uL — ABNORMAL LOW (ref 3.87–5.11)
RDW: 15 % (ref 11.5–15.5)
WBC: 12.1 10*3/uL — ABNORMAL HIGH (ref 4.0–10.5)

## 2014-03-26 LAB — BASIC METABOLIC PANEL
Anion gap: 14 (ref 5–15)
BUN: 32 mg/dL — ABNORMAL HIGH (ref 6–23)
CO2: 23 mEq/L (ref 19–32)
Calcium: 8.3 mg/dL — ABNORMAL LOW (ref 8.4–10.5)
Chloride: 105 mEq/L (ref 96–112)
Creatinine, Ser: 2.04 mg/dL — ABNORMAL HIGH (ref 0.50–1.10)
GFR calc Af Amer: 28 mL/min — ABNORMAL LOW (ref >90)
GFR calc non Af Amer: 24 mL/min — ABNORMAL LOW (ref >90)
Glucose, Bld: 148 mg/dL — ABNORMAL HIGH (ref 70–99)
Potassium: 3.5 mEq/L — ABNORMAL LOW (ref 3.7–5.3)
Sodium: 142 mEq/L (ref 137–147)

## 2014-03-26 LAB — HEMOGLOBIN A1C
Hgb A1c MFr Bld: 6.2 % — ABNORMAL HIGH (ref <5.7)
Mean Plasma Glucose: 131 mg/dL — ABNORMAL HIGH (ref <117)

## 2014-03-26 LAB — INFLUENZA PANEL BY PCR (TYPE A & B)
H1N1 flu by pcr: NOT DETECTED
Influenza A By PCR: NEGATIVE
Influenza B By PCR: NEGATIVE

## 2014-03-26 LAB — PROCALCITONIN
Procalcitonin: 2.35 ng/mL
Procalcitonin: 2.63 ng/mL

## 2014-03-26 LAB — HAPTOGLOBIN: Haptoglobin: 281 mg/dL — ABNORMAL HIGH (ref 45–215)

## 2014-03-26 LAB — PHOSPHORUS: Phosphorus: 2.9 mg/dL (ref 2.3–4.6)

## 2014-03-26 LAB — CORTISOL: Cortisol, Plasma: 48.9 ug/dL

## 2014-03-26 LAB — MAGNESIUM: Magnesium: 2.1 mg/dL (ref 1.5–2.5)

## 2014-03-26 MED ORDER — PIPERACILLIN-TAZOBACTAM 3.375 G IVPB
3.3750 g | Freq: Three times a day (TID) | INTRAVENOUS | Status: DC
  Administered 2014-03-26 – 2014-03-27 (×3): 3.375 g via INTRAVENOUS
  Filled 2014-03-26 (×2): qty 50

## 2014-03-26 MED ORDER — POTASSIUM CHLORIDE 20 MEQ/15ML (10%) PO SOLN
40.0000 meq | Freq: Once | ORAL | Status: AC
Start: 2014-03-26 — End: 2014-03-26
  Administered 2014-03-26: 40 meq
  Filled 2014-03-26: qty 30

## 2014-03-26 MED ORDER — POTASSIUM CHLORIDE 20 MEQ PO PACK: 40.0000 meq | PACK | Freq: Once | ORAL | Status: DC

## 2014-03-26 NOTE — Progress Notes (Signed)
PT Cancellation Note  Patient Details Name: Kimberly Larsen MRN: [id number] DOB: 08-02-1945   Cancelled Treatment:    Reason Eval/Treat Not Completed: Other (comment) (just extubated this AM. return in AM.)   [name] 03/26/2014, 10:51 AM

## 2014-03-26 NOTE — Plan of Care (Signed)
Problem: Phase I Progression Outcomes Goal: Initial discharge plan identified Outcome: Completed/Met Date Met:  03/26/14 Goal: Hemodynamically stable Outcome: Completed/Met Date Met:  03/26/14 Goal: Other Phase I Outcomes/Goals Outcome: Completed/Met Date Met:  03/26/14  Problem: Phase II Progression Outcomes Goal: Progress activity as tolerated unless otherwise ordered Outcome: Completed/Met Date Met:  03/26/14

## 2014-03-26 NOTE — Progress Notes (Signed)
ANTIBIOTIC CONSULT NOTE   Pharmacy Consult for zosyn Indication: UTI/sepsis  No Known Allergies  Patient Measurements: Height: 5\' 2"  (157.5 cm) Weight: 210 lb 5.1 oz (95.4 kg) IBW/kg (Calculated) : 50.1 Adjusted Body Weight:   Vital Signs: Temp: 98.1 F (36.7 C) (11/28 1200) Temp Source: Oral (11/28 1200) BP: 133/47 mmHg (11/28 1300) Pulse Rate: 65 (11/28 1300) Intake/Output from previous day: 11/27 0701 - 11/28 0700 In: 1946.5 [I.V.:1796.5; IV Piggyback:150] Out: 2150 [Urine:2150] Intake/Output from this shift: Total I/O In: 1025 [P.O.:300; I.V.:625; IV Piggyback:100] Out: 900 [Urine:900]  Labs:  Recent Labs  03/25/14 1200 03/25/14 2020 03/26/14 0400  WBC 13.6* 11.3* 12.1*  HGB 9.4* 11.6* 10.1*  PLT 226 212 180  CREATININE 3.10* 3.09* 2.04*   Estimated Creatinine Clearance: 29.2 mL/min (by C-G formula based on Cr of 2.04). No results for input(s): VANCOTROUGH, VANCOPEAK, VANCORANDOM, GENTTROUGH, GENTPEAK, GENTRANDOM, TOBRATROUGH, TOBRAPEAK, TOBRARND, AMIKACINPEAK, AMIKACINTROU, AMIKACIN in the last 72 hours.   Microbiology: Recent Results (from the past 720 hour(s))  Urine culture     Status: None (Preliminary result)   Collection Time: 03/25/14 11:42 AM  Result Value Ref Range Status   Specimen Description URINE, RANDOM  Final   Special Requests NONE  Final   Culture  Setup Time   Final    03/25/2014 17:15 Performed at [facility]   Final    >=100,000 COLONIES/ML Performed at [facility]    Culture   Final    ENTEROCOCCUS SPECIES Performed at [facility]    Report Status PENDING  Incomplete    Medical History: Past Medical History  Diagnosis Date  . Hypertension   . Arthritis   . HLD (hyperlipidemia)   . History of kidney stones   . Chronic kidney disease   . Anxiety    Assessment: 42 YOF presents with sepsis likely urinary source.  She is s/p BL stent for BL utereral calculi.  She is noted to  have ARI with SCr > 3 on admission.  She failed SBT post-operatively with severe hypoxemia and remains with VDRF. Pharmacy consulted to dose Zosyn.   She was given  zosyn dose at 1047 11/27, vancomycin 1gm at 1120  11/27 >> vancomycin  >> 11/28 11/27  >> zosyn  >>    Tmax: 100.4 WBCs: slightly elevated Renal: SCr 2.04 (improved) CrCl = 71ml/min  11/27 urine: pending 11/27 influenza panel:  11/27 blood: sent  Goal of Therapy:  Appropriate antibiotic dosing for renal function; eradication of infection  Plan:   Increase zosyn to 3.375gm IV q8h (extended infusion over 4 hours)  F/u renal function, culture results  Kizzie Furnish, PharmD Pager: [phone] 03/26/2014 1:28 PM

## 2014-03-26 NOTE — Anesthesia Postprocedure Evaluation (Signed)
  Anesthesia Post Note  Patient: Kimberly Larsen  Procedure(s) Performed: Procedure(s) (LRB): CYSTOSCOPY WITH URETEROSCOPY AND STENT PLACEMENT (Bilateral)  Anesthesia type: GA  Patient location: ICU  Post pain: Pain level unable to assess due to intubated and sedated status  Post assessment: Post-op Vital signs reviewed  Last Vitals:  Filed Vitals:   03/26/14 1900  BP: 144/62  Pulse: 78  Temp:   Resp: 19    Post vital signs: Reviewed  Level of consciousness: sedated  Complications: No apparent anesthesia complications

## 2014-03-26 NOTE — Progress Notes (Signed)
PULMONARY / CRITICAL CARE MEDICINE   Name: ANUPAMA PIEHL MRN: [id number] DOB: 09/29/1945    ADMISSION DATE:  03/25/2014 CONSULTATION DATE:  03/25/2014   REFERRING MD :  [facility]  CHIEF COMPLAINT:  Post op acute respiratory failure and hypotension  SIGNIFICANT EVENTS: 03/25/2014 - admit ,. Post op intubated and to ICU. Hypotension needing pressors and pccm consult 11/27 > B ureteral stents, Dr Alinda Money  Interval events:  Pressors weaned to off overnight Tolerating PSV this am   VITAL SIGNS: Temp:  [98.3 F (36.8 C)-101.5 F (38.6 C)] 99.5 F (37.5 C) (11/28 0404) Pulse Rate:  [39-115] 68 (11/28 0736) Resp:  [11-22] 18 (11/28 0736) BP: (75-162)/(29-80) 156/64 mmHg (11/28 0736) SpO2:  [87 %-100 %] 100 % (11/28 0736) FiO2 (%):  [30 %-100 %] 30 % (11/28 0736) Weight:  [89.359 kg (197 lb)-95.4 kg (210 lb 5.1 oz)] 95.4 kg (210 lb 5.1 oz) (11/28 0600) HEMODYNAMICS:   VENTILATOR SETTINGS: Vent Mode:  [-] CPAP;PSV FiO2 (%):  [30 %-100 %] 30 % Set Rate:  [12 bmp-18 bmp] 18 bmp Vt Set:  [400 mL-500 mL] 500 mL PEEP:  [5 cmH20] 5 cmH20 Pressure Support:  [5 cmH20] 5 cmH20 Plateau Pressure:  [9 HQI69-62 cmH20] 21 cmH20 INTAKE / OUTPUT:  Intake/Output Summary (Last 24 hours) at 03/26/14 0753 Last data filed at 03/26/14 0600  Gross per 24 hour  Intake 1821.53 ml  Output   2150 ml  Net -328.47 ml    PHYSICAL EXAMINATION: General:  Intubated, in ICU status Neuro:  Overall calm and then occ agitated. Follows commands. RASS +2 to -1  HEENT:  ETT + Cardiovascular:  Normal heart sounds Lungs:   On vent, synch with vent +, CTA bilaterally Abdomen:  Obese, soft Musculoskeletal:  No cyanosis, No clubbing. No edema Skin:  intact  LABS:  PULMONARY  Recent Labs Lab 03/25/14 2050 03/25/14 2215  PHART 7.255* 7.448  PCO2ART 61.0* 34.2*  PO2ART 170.0* 176.0*  HCO3 25.4* 23.3  TCO2 24.0 21.6  O2SAT 98.8 99.6    CBC  Recent Labs Lab 03/25/14 1200  03/25/14 2020 03/26/14 0400  HGB 9.4* 11.6* 10.1*  HCT 29.7* 36.9 31.6*  WBC 13.6* 11.3* 12.1*  PLT 226 212 180    COAGULATION No results for input(s): INR in the last 168 hours.  CARDIAC  No results for input(s): TROPONINI in the last 168 hours.  Recent Labs Lab 03/25/14 2020  PROBNP 2305.0*     CHEMISTRY  Recent Labs Lab 03/25/14 1200 03/25/14 2020 03/26/14 0400  NA 137 139 142  K 4.8 4.4 3.5*  CL 99 101 105  CO2 26 27 23   GLUCOSE 175* 129* 148*  BUN 45* 41* 32*  CREATININE 3.10* 3.09* 2.04*  CALCIUM 8.6 8.6 8.3*  MG  --   --  2.1  PHOS  --   --  2.9   Estimated Creatinine Clearance: 29.2 mL/min (by C-G formula based on Cr of 2.04).   LIVER  Recent Labs Lab 03/25/14 1200 03/25/14 2020  AST 28 53*  ALT 14 29  ALKPHOS 79 98  BILITOT 0.4 0.5  PROT 5.4* 6.8  ALBUMIN 2.7* 3.1*     INFECTIOUS  Recent Labs Lab 03/25/14 1225 03/25/14 1540 03/25/14 2020 03/25/14 2331 03/26/14 0400  LATICACIDVEN 1.24 0.61 1.2  --   --   PROCALCITON  --   --   --  2.35 2.63     ENDOCRINE CBG (last 3)  No results for  input(s): GLUCAP in the last 72 hours.   IMAGING x48h Dg Chest 2 View  03/25/2014   CLINICAL DATA:  Fever since earlier this morning.  EXAM: CHEST  2 VIEW  COMPARISON:  02/21/2014.  FINDINGS: Normal sized heart. Stable linear scarring at the left lung base. Interval neural stimulator leads in the lower thoracic spinal canal. Diffuse osteopenia. Thoracic spine degenerative changes.  IMPRESSION: No active cardiopulmonary disease.   Electronically Signed   By: Enrique Sack M.D.   On: 03/25/2014 10:44   Ct Thoracic Spine Wo Contrast  03/25/2014   CLINICAL DATA:  Sepsis, mid to low back pain, onset of weakness at 1300 hr yesterday, fever, nausea and dizziness with walking  EXAM: CT THORACIC AND LUMBAR SPINE WITHOUT CONTRAST  TECHNIQUE: Multidetector CT imaging of the thoracic and lumbar spine was performed without contrast. Multiplanar CT image  reconstructions were also generated.  COMPARISON:  CT lumbar myelography 01/19/2014  FINDINGS: CT THORACIC SPINE FINDINGS  Twelve pairs of ribs.  Diffuse osseous demineralization.  Intraspinal stimulator lead at inferior T7 to superior T10.  Scattered disc space narrowing and endplate spur formation throughout thoracic spine.  No acute fracture or subluxation.  No bone destruction seen to suggest vertebral osteomyelitis or discitis.  Dependent atelectasis in both lungs.  No abnormal paraspinal fluid collections.  No definite intraspinal abnormalities identified by noncontrast CT.  CT LUMBAR SPINE FINDINGS  Osseous demineralization.  Five non-rib-bearing lumbar vertebrae.  Prior L3-L4 posterior fusion and L4 laminectomy with intact hardware.  Superior endplate concavity at L4.  Vertebral body heights otherwise maintained without fracture or additional subluxation.  Multilevel facet degenerative changes lumbar spine.  No bone destruction or paraspinal fluid collections identified to suggest osteomyelitis or discitis.  L1-L2:  No abnormalities  L2-L3: Mild diffuse disc bulge. Ligamentum flavum thickening. No focal disc herniation or neural compression. AP narrowing of spinal canal, multifactorial.  L3-L4: Foramina grossly patent. No disc herniation or neural compression.  L4-L5: Significant artifacts from lumbar hardware and intraspinal stimulator generator pack. Diffuse disc bulge extending into LEFT neural foramen without compression of nerve root.  L5-S1: Retrolisthesis with disc space narrowing, vacuum phenomenon, endplate spurs and pseudo disc versus calcified disc fragment, narrowing AP diameter of spinal canal. No definite compression of the S1 roots as they enter the lateral recesses. Foramina patent.  BILATERAL hydronephrosis LEFT greater than RIGHT secondary to UPJ calculi, 9 x 6 mm LEFT and 7 x 8 mm RIGHT.  Additional BILATERAL renal calculi up to 10 mm diameter RIGHT and 11 mm diameter LEFT.  No ureteral  dilatation.  Calcified leiomyoma in uterus.  Sigmoid diverticulosis.  No paraspinal fluid collections.  IMPRESSION: CT THORACIC SPINE IMPRESSION  Multilevel degenerative disc and facet disease changes.  Osseous demineralization.  No acute thoracic spine abnormalities.  CT LUMBAR SPINE IMPRESSION  Prior L3-L4 fusion.  Multilevel degenerative disc and facet disease changes.  Bulging L4-L5 disc extending into LEFT neural foramen without neural compression.  Retrolisthesis with pseudo disc, endplate spurs and question focal disc herniation at L5-S1, causing no neural compression.  Multiple BILATERAL renal calculi as above with associated BILATERAL hydronephrosis LEFT greater than RIGHT due to large UPJ calculi bilaterally.  Sigmoid diverticulosis.  Findings called to [facility] on 03/25/2014 at 1550 hr.   Electronically Signed   By: Lavonia Dana M.D.   On: 03/25/2014 15:54   Ct Lumbar Spine Wo Contrast  03/25/2014   CLINICAL DATA:  Sepsis, mid to low back pain, onset of weakness  at 1300 hr yesterday, fever, nausea and dizziness with walking  EXAM: CT THORACIC AND LUMBAR SPINE WITHOUT CONTRAST  TECHNIQUE: Multidetector CT imaging of the thoracic and lumbar spine was performed without contrast. Multiplanar CT image reconstructions were also generated.  COMPARISON:  CT lumbar myelography 01/19/2014  FINDINGS: CT THORACIC SPINE FINDINGS  Twelve pairs of ribs.  Diffuse osseous demineralization.  Intraspinal stimulator lead at inferior T7 to superior T10.  Scattered disc space narrowing and endplate spur formation throughout thoracic spine.  No acute fracture or subluxation.  No bone destruction seen to suggest vertebral osteomyelitis or discitis.  Dependent atelectasis in both lungs.  No abnormal paraspinal fluid collections.  No definite intraspinal abnormalities identified by noncontrast CT.  CT LUMBAR SPINE FINDINGS  Osseous demineralization.  Five non-rib-bearing lumbar vertebrae.  Prior L3-L4 posterior fusion and  L4 laminectomy with intact hardware.  Superior endplate concavity at L4.  Vertebral body heights otherwise maintained without fracture or additional subluxation.  Multilevel facet degenerative changes lumbar spine.  No bone destruction or paraspinal fluid collections identified to suggest osteomyelitis or discitis.  L1-L2:  No abnormalities  L2-L3: Mild diffuse disc bulge. Ligamentum flavum thickening. No focal disc herniation or neural compression. AP narrowing of spinal canal, multifactorial.  L3-L4: Foramina grossly patent. No disc herniation or neural compression.  L4-L5: Significant artifacts from lumbar hardware and intraspinal stimulator generator pack. Diffuse disc bulge extending into LEFT neural foramen without compression of nerve root.  L5-S1: Retrolisthesis with disc space narrowing, vacuum phenomenon, endplate spurs and pseudo disc versus calcified disc fragment, narrowing AP diameter of spinal canal. No definite compression of the S1 roots as they enter the lateral recesses. Foramina patent.  BILATERAL hydronephrosis LEFT greater than RIGHT secondary to UPJ calculi, 9 x 6 mm LEFT and 7 x 8 mm RIGHT.  Additional BILATERAL renal calculi up to 10 mm diameter RIGHT and 11 mm diameter LEFT.  No ureteral dilatation.  Calcified leiomyoma in uterus.  Sigmoid diverticulosis.  No paraspinal fluid collections.  IMPRESSION: CT THORACIC SPINE IMPRESSION  Multilevel degenerative disc and facet disease changes.  Osseous demineralization.  No acute thoracic spine abnormalities.  CT LUMBAR SPINE IMPRESSION  Prior L3-L4 fusion.  Multilevel degenerative disc and facet disease changes.  Bulging L4-L5 disc extending into LEFT neural foramen without neural compression.  Retrolisthesis with pseudo disc, endplate spurs and question focal disc herniation at L5-S1, causing no neural compression.  Multiple BILATERAL renal calculi as above with associated BILATERAL hydronephrosis LEFT greater than RIGHT due to large UPJ calculi  bilaterally.  Sigmoid diverticulosis.  Findings called to [facility] on 03/25/2014 at 1550 hr.   Electronically Signed   By: Lavonia Dana M.D.   On: 03/25/2014 15:54   US Renal  03/25/2014   CLINICAL DATA:  Acute kidney injury.  EXAM: RENAL/URINARY TRACT ULTRASOUND COMPLETE  COMPARISON:  None.  FINDINGS: Right Kidney:  Length: 12.6 cm. Echogenicity within normal limits. Mild right hydronephrosis is noted.  Left Kidney:  Length: 12.8 cm. Echogenicity within normal limits. Moderate hydronephrosis is noted. Intrarenal calculi are noted.  Bladder:  Decompressed secondary to Foley catheter.  IMPRESSION: Mild right hydronephrosis is noted. Moderate left hydronephrosis is noted with intrarenal calculi. CT urogram is recommended for further evaluation.   Electronically Signed   By: Sabino Dick M.D.   On: 03/25/2014 21:35   Dg Chest Port 1 View  03/25/2014   CLINICAL DATA:  Acute respiratory failure with hypoxia. Intubated patient.  EXAM: PORTABLE CHEST - 1 VIEW  COMPARISON:  02/21/2014  FINDINGS: Endotracheal tube is roughly 3.8 cm above the carina. Neural stimulator device in the lower thoracic spine. Low lung volumes with diffuse interstitial lung densities bilaterally. Findings are suggestive for areas of atelectasis and edema. Heart size is within normal limits. Negative for a pneumothorax.  IMPRESSION: Slightly low lung volumes with evidence for interstitial edema and atelectasis.  Endotracheal tube is appropriately positioned.   Electronically Signed   By: Markus Daft M.D.   On: 03/25/2014 20:31      ASSESSMENT / PLAN:  PULMONARY OETT 03/25/2014 >>   A: acute post op resp failure due to sepsis, uti, hydro P:   Full vent support, tolerating PSV Goal extubate 11/28 am  CARDIOVASCULAR CVL   A: BAseline  - BP on ace inhibitor  - Hyperlipedemia  On aspirin and statin   septic shock at ER and recurrence post op in ICU. Improved 11/28 am P:  Pressors weaned to off HOld ace inhibitor and  statin hydrocort ordered empirically, can d/c as adequate random cort 11/28  RENAL A:  Post obstructive renal failure - s/p ureteral stents 03/25/2014. Also on nephrotoxic gabapentin and ace inhibitor at home. Improving 11/28  P:   Fluids Anticipate improvement  s/p ureteral stents and resuscitation Maintain MAP > 65 Avoid ace inhibitor and gabapentin, restart when acute events resolved  GASTROINTESTINAL A:  NPO P:   OG tube to LIS  HEMATOLOGIC A:   Anemia of critical illness  P:  PRBC for hjgb  <7gm% Lovenox for dvt proph  INFECTIOUS Pct 2.35 11/27 BCx2 >> UC  >>  MRSA PCR - negative  A:  Septic shock due to stones. uti P:   Zosyn, narrow as cx data indicates D/c Vanco 11/28 and follow  ENDOCRINE A:  No hx of dm   P:   D/c hydrocort Start SSI if CBG;s consistently > 180  NEUROLOGIC A:   Chronic back pain and multiple sx and oct 2015 s/p stimulator by Dr Rolena Infante Chronic CNS meds are  - celexa, neurontin, Robaxin, percocet, trazodone P:   Fent prn Ativan prn given cns polypharmacy Hold home cns meds but restart once acute event resolves RASS goal: 0   FAMILY  - Updates: no family at bedside 11/28  - Inter-disciplinary family meet or Palliative Care meeting due by:  04/01/14   SUMMARY VDRF,  Circulatory shock, ATN, folliwng bialteral hydro following ureteral stones and s/p stents 03/25/2014    The patient is critically ill with multiple organ systems failure and requires high complexity decision making for assessment and support, frequent evaluation and titration of therapies, application of advanced monitoring technologies and extensive interpretation of multiple databases.   Independent Critical Care Time devoted to patient care services described in this note is 35 Minutes.   Baltazar Apo, MD, PhD 03/26/2014, 8:02 AM [facility] [phone] or if no answer [phone]

## 2014-03-26 NOTE — Evaluation (Signed)
Physical Therapy Evaluation Patient Details Name: Kimberly Larsen MRN: [id number] DOB: 1945/11/18 Today's Date: 03/26/2014   History of Present Illness  Indication: Kimberly Larsen is a 68 y.o. patient with fever and developing sepsis, bilateral ureteral obstruction due to calculi, and an acute kidney injury. After reviewing the management options for treatment, she elected to proceed with the  bilateral ureteral stents.Extubated 03/26/14  Clinical Impression  Patient tolerated  Ambulation, drop in sats on RA after short distance, will monitor sats with mobility. Patient will benefit from PT to adress problems listed in note.    Follow Up Recommendations Home health PT;Supervision/Assistance - 24 hour    Equipment Recommendations  None recommended by PT    Recommendations for Other Services OT consult     Precautions / Restrictions Precautions Precautions: Fall Precaution Comments: monitor sats, just weaned from vent 11/28.      Mobility  Bed Mobility Overal bed mobility: Needs Assistance Bed Mobility: Supine to Sit     Supine to sit: Mod assist     General bed mobility comments: extra time for getting trunk upright  Transfers Overall transfer level: Needs assistance Equipment used: Rolling walker (2 wheeled) Transfers: Sit to/from Stand Sit to Stand: Min assist         General transfer comment: cues for safety  Ambulation/Gait Ambulation/Gait assistance: Min assist Ambulation Distance (Feet): 60 Feet Assistive device: Rolling walker (2 wheeled) Gait Pattern/deviations: Step-through pattern     General Gait Details: sats down to 86 while walking on RA, encouraged pursed lip breaths, replaced 2 l O2 immediately and sats back to 94%  Stairs            Wheelchair Mobility    Modified Rankin (Stroke Patients Only)       Balance                                             Pertinent Vitals/Pain Pain Assessment: 0-10 Pain Score: 5   Pain Location: across abdomen, low Pain Descriptors / Indicators: Discomfort Pain Intervention(s): Premedicated before session;Monitored during session;Repositioned    Home Living Family/patient expects to be discharged to:: Private residence Living Arrangements: Children Available Help at Discharge: Family;Friend(s);Neighbor;Available PRN/intermittently Type of Home: Mobile home Home Access: Stairs to enter Entrance Stairs-Rails: Right;Left;Can reach both   Home Layout: One level Home Equipment: Walker - 2 wheels;Cane - single point;Bedside commode;Shower seat;Tub bench Additional Comments: still works    Prior Function Level of Independence: Investment banker, corporate        Extremity/Trunk Assessment   Upper Extremity Assessment: Generalized weakness           Lower Extremity Assessment: Generalized weakness         Communication      Cognition Arousal/Alertness: Awake/alert Behavior During Therapy: WFL for tasks assessed/performed Overall Cognitive Status: Within Functional Limits for tasks assessed                      General Comments      Exercises        Assessment/Plan    PT Assessment Patient needs continued PT services  PT Diagnosis Difficulty walking;Generalized weakness   PT Problem List Decreased strength;Decreased activity tolerance;Decreased mobility;Decreased knowledge of precautions;Decreased safety awareness;Decreased knowledge of use of DME;Pain  PT  Treatment Interventions DME instruction;Gait training;Functional mobility training;Therapeutic activities;Therapeutic exercise;Patient/family education   PT Goals (Current goals can be found in the Care Plan section) Acute Rehab PT Goals Patient Stated Goal: to get stronger, go back to work PT Goal Formulation: With patient Time For Goal Achievement: 04/09/14 Potential to Achieve Goals: Good    Frequency Min 3X/week   Barriers to discharge         Co-evaluation               End of Session Equipment Utilized During Treatment: Oxygen Activity Tolerance: Patient limited by fatigue;Treatment limited secondary to medical complications (Comment) Patient left: in chair;with call bell/phone within reach;with nursing/sitter in room Nurse Communication: Mobility status         Time: 3662-9476 PT Time Calculation (min) (ACUTE ONLY): 30 min   Charges:   PT Evaluation $Initial PT Evaluation Tier I: 1 Procedure PT Treatments $Gait Training: 23-37 mins   PT G Codes:          [name] 03/26/2014, 12:17 PM Tresa Endo PT [phone]

## 2014-03-26 NOTE — Progress Notes (Signed)
Subjective: The patient reports no flank pain or bladder discomfort.  Objective: Vital signs in last 24 hours: Temp:  [98.1 F (36.7 C)-100.4 F (38 C)] 98.1 F (36.7 C) (11/28 1200) Pulse Rate:  [39-115] 65 (11/28 1300) Resp:  [11-22] 17 (11/28 1300) BP: (75-163)/(29-80) 133/47 mmHg (11/28 1300) SpO2:  [91 %-100 %] 99 % (11/28 1300) FiO2 (%):  [30 %-100 %] 30 % (11/28 0736) Weight:  [95.4 kg (210 lb 5.1 oz)] 95.4 kg (210 lb 5.1 oz) (11/28 0600)A  Intake/Output from previous day: 11/27 0701 - 11/28 0700 In: 1946.5 [I.V.:1796.5; IV Piggyback:150] Out: 2150 [Urine:2150] Intake/Output this shift: Total I/O In: 1025 [P.O.:300; I.V.:625; IV Piggyback:100] Out: 37 [Urine:900]  Past Medical History  Diagnosis Date  . Hypertension   . Arthritis   . HLD (hyperlipidemia)   . History of kidney stones   . Chronic kidney disease   . Anxiety     Physical Exam:  She is off the ventilator and is awake and alert in no apparent distress. Lungs - Normal respiratory effort, chest expands symmetrically.  Abdomen - Soft, non-tender & non-distended. Foley catheter is draining clear urine.  Lab Results:  Recent Labs  03/25/14 1200 03/25/14 2020 03/26/14 0400  WBC 13.6* 11.3* 12.1*  HGB 9.4* 11.6* 10.1*  HCT 29.7* 36.9 31.6*   BMET  Recent Labs  03/25/14 2020 03/26/14 0400  NA 139 142  K 4.4 3.5*  CL 101 105  CO2 27 23  GLUCOSE 129* 148*  BUN 41* 32*  CREATININE 3.09* 2.04*  CALCIUM 8.6 8.3*   No results for input(s): LABURIN in the last 72 hours. Results for orders placed or performed during the hospital encounter of 03/25/14  Urine culture     Status: None (Preliminary result)   Collection Time: 03/25/14 11:42 AM  Result Value Ref Range Status   Specimen Description URINE, RANDOM  Final   Special Requests NONE  Final   Culture  Setup Time   Final    03/25/2014 17:15 Performed at [facility]   Final    >=100,000 COLONIES/ML Performed  at [facility]    Culture   Final    ENTEROCOCCUS SPECIES Performed at [facility]    Report Status PENDING  Incomplete    Studies/Results: Dg Chest 2 View  03/25/2014   CLINICAL DATA:  Fever since earlier this morning.  EXAM: CHEST  2 VIEW  COMPARISON:  02/21/2014.  FINDINGS: Normal sized heart. Stable linear scarring at the left lung base. Interval neural stimulator leads in the lower thoracic spinal canal. Diffuse osteopenia. Thoracic spine degenerative changes.  IMPRESSION: No active cardiopulmonary disease.   Electronically Signed   By: Enrique Sack M.D.   On: 03/25/2014 10:44   Ct Thoracic Spine Wo Contrast  03/25/2014   CLINICAL DATA:  Sepsis, mid to low back pain, onset of weakness at 1300 hr yesterday, fever, nausea and dizziness with walking  EXAM: CT THORACIC AND LUMBAR SPINE WITHOUT CONTRAST  TECHNIQUE: Multidetector CT imaging of the thoracic and lumbar spine was performed without contrast. Multiplanar CT image reconstructions were also generated.  COMPARISON:  CT lumbar myelography 01/19/2014  FINDINGS: CT THORACIC SPINE FINDINGS  Twelve pairs of ribs.  Diffuse osseous demineralization.  Intraspinal stimulator lead at inferior T7 to superior T10.  Scattered disc space narrowing and endplate spur formation throughout thoracic spine.  No acute fracture or subluxation.  No bone destruction seen to suggest vertebral osteomyelitis or discitis.  Dependent atelectasis in both lungs.  No abnormal paraspinal fluid collections.  No definite intraspinal abnormalities identified by noncontrast CT.  CT LUMBAR SPINE FINDINGS  Osseous demineralization.  Five non-rib-bearing lumbar vertebrae.  Prior L3-L4 posterior fusion and L4 laminectomy with intact hardware.  Superior endplate concavity at L4.  Vertebral body heights otherwise maintained without fracture or additional subluxation.  Multilevel facet degenerative changes lumbar spine.  No bone destruction or paraspinal fluid  collections identified to suggest osteomyelitis or discitis.  L1-L2:  No abnormalities  L2-L3: Mild diffuse disc bulge. Ligamentum flavum thickening. No focal disc herniation or neural compression. AP narrowing of spinal canal, multifactorial.  L3-L4: Foramina grossly patent. No disc herniation or neural compression.  L4-L5: Significant artifacts from lumbar hardware and intraspinal stimulator generator pack. Diffuse disc bulge extending into LEFT neural foramen without compression of nerve root.  L5-S1: Retrolisthesis with disc space narrowing, vacuum phenomenon, endplate spurs and pseudo disc versus calcified disc fragment, narrowing AP diameter of spinal canal. No definite compression of the S1 roots as they enter the lateral recesses. Foramina patent.  BILATERAL hydronephrosis LEFT greater than RIGHT secondary to UPJ calculi, 9 x 6 mm LEFT and 7 x 8 mm RIGHT.  Additional BILATERAL renal calculi up to 10 mm diameter RIGHT and 11 mm diameter LEFT.  No ureteral dilatation.  Calcified leiomyoma in uterus.  Sigmoid diverticulosis.  No paraspinal fluid collections.  IMPRESSION: CT THORACIC SPINE IMPRESSION  Multilevel degenerative disc and facet disease changes.  Osseous demineralization.  No acute thoracic spine abnormalities.  CT LUMBAR SPINE IMPRESSION  Prior L3-L4 fusion.  Multilevel degenerative disc and facet disease changes.  Bulging L4-L5 disc extending into LEFT neural foramen without neural compression.  Retrolisthesis with pseudo disc, endplate spurs and question focal disc herniation at L5-S1, causing no neural compression.  Multiple BILATERAL renal calculi as above with associated BILATERAL hydronephrosis LEFT greater than RIGHT due to large UPJ calculi bilaterally.  Sigmoid diverticulosis.  Findings called to [facility] on 03/25/2014 at 1550 hr.   Electronically Signed   By: Lavonia Dana M.D.   On: 03/25/2014 15:54   Ct Lumbar Spine Wo Contrast  03/25/2014   CLINICAL DATA:  Sepsis, mid to low back  pain, onset of weakness at 1300 hr yesterday, fever, nausea and dizziness with walking  EXAM: CT THORACIC AND LUMBAR SPINE WITHOUT CONTRAST  TECHNIQUE: Multidetector CT imaging of the thoracic and lumbar spine was performed without contrast. Multiplanar CT image reconstructions were also generated.  COMPARISON:  CT lumbar myelography 01/19/2014  FINDINGS: CT THORACIC SPINE FINDINGS  Twelve pairs of ribs.  Diffuse osseous demineralization.  Intraspinal stimulator lead at inferior T7 to superior T10.  Scattered disc space narrowing and endplate spur formation throughout thoracic spine.  No acute fracture or subluxation.  No bone destruction seen to suggest vertebral osteomyelitis or discitis.  Dependent atelectasis in both lungs.  No abnormal paraspinal fluid collections.  No definite intraspinal abnormalities identified by noncontrast CT.  CT LUMBAR SPINE FINDINGS  Osseous demineralization.  Five non-rib-bearing lumbar vertebrae.  Prior L3-L4 posterior fusion and L4 laminectomy with intact hardware.  Superior endplate concavity at L4.  Vertebral body heights otherwise maintained without fracture or additional subluxation.  Multilevel facet degenerative changes lumbar spine.  No bone destruction or paraspinal fluid collections identified to suggest osteomyelitis or discitis.  L1-L2:  No abnormalities  L2-L3: Mild diffuse disc bulge. Ligamentum flavum thickening. No focal disc herniation or neural compression. AP narrowing of spinal canal, multifactorial.  L3-L4:  Foramina grossly patent. No disc herniation or neural compression.  L4-L5: Significant artifacts from lumbar hardware and intraspinal stimulator generator pack. Diffuse disc bulge extending into LEFT neural foramen without compression of nerve root.  L5-S1: Retrolisthesis with disc space narrowing, vacuum phenomenon, endplate spurs and pseudo disc versus calcified disc fragment, narrowing AP diameter of spinal canal. No definite compression of the S1 roots as  they enter the lateral recesses. Foramina patent.  BILATERAL hydronephrosis LEFT greater than RIGHT secondary to UPJ calculi, 9 x 6 mm LEFT and 7 x 8 mm RIGHT.  Additional BILATERAL renal calculi up to 10 mm diameter RIGHT and 11 mm diameter LEFT.  No ureteral dilatation.  Calcified leiomyoma in uterus.  Sigmoid diverticulosis.  No paraspinal fluid collections.  IMPRESSION: CT THORACIC SPINE IMPRESSION  Multilevel degenerative disc and facet disease changes.  Osseous demineralization.  No acute thoracic spine abnormalities.  CT LUMBAR SPINE IMPRESSION  Prior L3-L4 fusion.  Multilevel degenerative disc and facet disease changes.  Bulging L4-L5 disc extending into LEFT neural foramen without neural compression.  Retrolisthesis with pseudo disc, endplate spurs and question focal disc herniation at L5-S1, causing no neural compression.  Multiple BILATERAL renal calculi as above with associated BILATERAL hydronephrosis LEFT greater than RIGHT due to large UPJ calculi bilaterally.  Sigmoid diverticulosis.  Findings called to [facility] PA on 03/25/2014 at 1550 hr.   Electronically Signed   By: Lavonia Dana M.D.   On: 03/25/2014 15:54   US Renal  03/25/2014   CLINICAL DATA:  Acute kidney injury.  EXAM: RENAL/URINARY TRACT ULTRASOUND COMPLETE  COMPARISON:  None.  FINDINGS: Right Kidney:  Length: 12.6 cm. Echogenicity within normal limits. Mild right hydronephrosis is noted.  Left Kidney:  Length: 12.8 cm. Echogenicity within normal limits. Moderate hydronephrosis is noted. Intrarenal calculi are noted.  Bladder:  Decompressed secondary to Foley catheter.  IMPRESSION: Mild right hydronephrosis is noted. Moderate left hydronephrosis is noted with intrarenal calculi. CT urogram is recommended for further evaluation.   Electronically Signed   By: Sabino Dick M.D.   On: 03/25/2014 21:35   Dg Chest Port 1 View  03/25/2014   CLINICAL DATA:  Acute respiratory failure with hypoxia. Intubated patient.  EXAM: PORTABLE CHEST -  1 VIEW  COMPARISON:  02/21/2014  FINDINGS: Endotracheal tube is roughly 3.8 cm above the carina. Neural stimulator device in the lower thoracic spine. Low lung volumes with diffuse interstitial lung densities bilaterally. Findings are suggestive for areas of atelectasis and edema. Heart size is within normal limits. Negative for a pneumothorax.  IMPRESSION: Slightly low lung volumes with evidence for interstitial edema and atelectasis.  Endotracheal tube is appropriately positioned.   Electronically Signed   By: Markus Daft M.D.   On: 03/25/2014 20:31    Assessment: She has had marked improvement since being stented.  She's got off the ventilator and is off pressors.  The stents are not causing any discomfort and her urine is clear. All of her cultures remain pending at this time. She is on broad-spectrum antibiotics. Her creatinine has improved significantly now down to 2.04.  Plan:   1. Continue Foley catheter for now.   2.  Await culture results.   3.  Her bilateral proximal ureteral stones will be addressed as an outpatient. Tonjua Rossetti C 03/26/2014, 2:01 PM

## 2014-03-26 NOTE — Procedures (Signed)
Extubation Procedure Note  Patient Details:   Name: Kimberly Larsen DOB: 28-Aug-1945 MRN: [id number]   Airway Documentation:     Evaluation  O2 sats: stable throughout Complications: No apparent complications Patient did tolerate procedure well. Bilateral Breath Sounds: Diminished, Rhonchi Suctioning: Airway Yes  [name] 03/26/2014, 9:00 AM

## 2014-03-27 LAB — BASIC METABOLIC PANEL
Anion gap: 10 (ref 5–15)
BUN: 17 mg/dL (ref 6–23)
CO2: 27 mEq/L (ref 19–32)
Calcium: 8.7 mg/dL (ref 8.4–10.5)
Chloride: 105 mEq/L (ref 96–112)
Creatinine, Ser: 1.15 mg/dL — ABNORMAL HIGH (ref 0.50–1.10)
GFR calc Af Amer: 56 mL/min — ABNORMAL LOW (ref >90)
GFR calc non Af Amer: 48 mL/min — ABNORMAL LOW (ref >90)
Glucose, Bld: 147 mg/dL — ABNORMAL HIGH (ref 70–99)
Potassium: 3 mEq/L — ABNORMAL LOW (ref 3.7–5.3)
Sodium: 142 mEq/L (ref 137–147)

## 2014-03-27 LAB — URINE CULTURE
Colony Count: >=100000
Colony Count: 9000

## 2014-03-27 LAB — MAGNESIUM: Magnesium: 1.8 mg/dL (ref 1.5–2.5)

## 2014-03-27 LAB — PROCALCITONIN: Procalcitonin: 1.51 ng/mL

## 2014-03-27 LAB — PHOSPHORUS: Phosphorus: 1.5 mg/dL — ABNORMAL LOW (ref 2.3–4.6)

## 2014-03-27 MED ORDER — POTASSIUM CHLORIDE CRYS ER 20 MEQ PO TBCR
40.0000 meq | EXTENDED_RELEASE_TABLET | Freq: Once | ORAL | Status: AC
  Administered 2014-03-27: 40 meq via ORAL
  Filled 2014-03-27: qty 2

## 2014-03-27 MED ORDER — HYDROCHLOROTHIAZIDE 12.5 MG PO CAPS
12.5000 mg | ORAL_CAPSULE | Freq: Every day | ORAL | Status: DC
  Administered 2014-03-27 – 2014-03-29 (×3): 12.5 mg via ORAL
  Filled 2014-03-27 (×3): qty 1

## 2014-03-27 MED ORDER — SODIUM CHLORIDE 0.9 % IV SOLN
1.0000 g | Freq: Four times a day (QID) | INTRAVENOUS | Status: DC
  Administered 2014-03-27 – 2014-03-28 (×3): 1 g via INTRAVENOUS
  Filled 2014-03-27 (×5): qty 1000

## 2014-03-27 MED ORDER — ONDANSETRON HCL 4 MG/2ML IJ SOLN
INTRAMUSCULAR | Status: AC
  Filled 2014-03-27: qty 2

## 2014-03-27 MED ORDER — HYDRALAZINE HCL 20 MG/ML IJ SOLN
10.0000 mg | Freq: Four times a day (QID) | INTRAMUSCULAR | Status: DC | PRN
  Administered 2014-03-27 – 2014-03-29 (×2): 10 mg via INTRAVENOUS
  Filled 2014-03-27 (×2): qty 1

## 2014-03-27 MED ORDER — GABAPENTIN 300 MG PO CAPS
600.0000 mg | ORAL_CAPSULE | Freq: Three times a day (TID) | ORAL | Status: DC
  Administered 2014-03-27 – 2014-03-29 (×6): 600 mg via ORAL
  Filled 2014-03-27 (×10): qty 2

## 2014-03-27 MED ORDER — TRAZODONE HCL 50 MG PO TABS
50.0000 mg | ORAL_TABLET | Freq: Every evening | ORAL | Status: DC | PRN
Start: 2014-03-27 — End: 2014-03-29
  Administered 2014-03-27: 50 mg via ORAL
  Filled 2014-03-27: qty 1

## 2014-03-27 MED ORDER — OXYCODONE-ACETAMINOPHEN 5-325 MG PO TABS
1.0000 | ORAL_TABLET | ORAL | Status: DC | PRN
  Administered 2014-03-28 – 2014-03-29 (×2): 1 via ORAL
  Filled 2014-03-27 (×2): qty 1

## 2014-03-27 MED ORDER — ONDANSETRON HCL 4 MG/2ML IJ SOLN
4.0000 mg | Freq: Four times a day (QID) | INTRAMUSCULAR | Status: DC | PRN
  Administered 2014-03-27 – 2014-03-29 (×4): 4 mg via INTRAVENOUS
  Filled 2014-03-27 (×4): qty 2

## 2014-03-27 MED ORDER — CITALOPRAM HYDROBROMIDE 40 MG PO TABS
40.0000 mg | ORAL_TABLET | Freq: Every day | ORAL | Status: DC
  Administered 2014-03-27 – 2014-03-29 (×3): 40 mg via ORAL
  Filled 2014-03-27 (×2): qty 1
  Filled 2014-03-27: qty 2

## 2014-03-27 MED ORDER — ENOXAPARIN SODIUM 60 MG/0.6ML ~~LOC~~ SOLN
0.5000 mg/kg | SUBCUTANEOUS | Status: DC
  Administered 2014-03-27 – 2014-03-28 (×2): 50 mg via SUBCUTANEOUS
  Filled 2014-03-27 (×3): qty 0.6

## 2014-03-27 NOTE — Progress Notes (Signed)
PULMONARY / CRITICAL CARE MEDICINE   Name: Kimberly Larsen MRN: [id number] DOB: [birth year]    ADMISSION DATE:  03/25/2014 CONSULTATION DATE:  03/25/2014   REFERRING MD :  [facility] hospitalist  CHIEF COMPLAINT:  Post op acute respiratory failure and hypotension  SIGNIFICANT EVENTS: 03/25/2014 - admit ,. Post op intubated and to ICU. Hypotension needing pressors and pccm consult 11/27 > B ureteral stents, Dr Alinda Money  Interval events:  Extubated and stable,  BP up slightly compared with 11/28 Taking PO  VITAL SIGNS: Temp:  [98.1 F (36.7 C)-101.6 F (38.7 C)] 101.6 F (38.7 C) (11/29 0400) Pulse Rate:  [65-114] 92 (11/29 0753) Resp:  [12-25] 22 (11/29 0753) BP: (112-175)/(47-95) 175/89 mmHg (11/29 0753) SpO2:  [94 %-100 %] 94 % (11/29 0753) Weight:  [95 kg (209 lb 7 oz)] 95 kg (209 lb 7 oz) (11/29 0400) HEMODYNAMICS:   VENTILATOR SETTINGS:   INTAKE / OUTPUT:  Intake/Output Summary (Last 24 hours) at 03/27/14 0915 Last data filed at 03/27/14 0700  Gross per 24 hour  Intake   2030 ml  Output   3100 ml  Net  -1070 ml    PHYSICAL EXAMINATION: General: comfortable on [place] O2, awake Neuro:  Awake and interacting, non-focal HEENT:  OP clear, slightly hoarse voice Cardiovascular:  Normal heart sounds Lungs:   Clear B Abdomen:  Obese, soft Musculoskeletal:  No cyanosis, No clubbing. No edema Skin:  intact  LABS:  PULMONARY  Recent Labs Lab 03/25/14 2050 03/25/14 2215  PHART 7.255* 7.448  PCO2ART 61.0* 34.2*  PO2ART 170.0* 176.0*  HCO3 25.4* 23.3  TCO2 24.0 21.6  O2SAT 98.8 99.6    CBC  Recent Labs Lab 03/25/14 1200 03/25/14 2020 03/26/14 0400  HGB 9.4* 11.6* 10.1*  HCT 29.7* 36.9 31.6*  WBC 13.6* 11.3* 12.1*  PLT 226 212 180    COAGULATION No results for input(s): INR in the last 168 hours.  CARDIAC  No results for input(s): TROPONINI in the last 168 hours.  Recent Labs Lab 03/25/14 2020  PROBNP 2305.0*     CHEMISTRY  Recent  Labs Lab 03/25/14 1200 03/25/14 2020 03/26/14 0400 03/27/14 0410  NA 137 139 142 142  K 4.8 4.4 3.5* 3.0*  CL 99 101 105 105  CO2 26 27 23 27   GLUCOSE 175* 129* 148* 147*  BUN 45* 41* 32* 17  CREATININE 3.10* 3.09* 2.04* 1.15*  CALCIUM 8.6 8.6 8.3* 8.7  MG  --   --  2.1 1.8  PHOS  --   --  2.9 1.5*   Estimated Creatinine Clearance: 51.7 mL/min (by C-G formula based on Cr of 1.15).   LIVER  Recent Labs Lab 03/25/14 1200 03/25/14 2020  AST 28 53*  ALT 14 29  ALKPHOS 79 98  BILITOT 0.4 0.5  PROT 5.4* 6.8  ALBUMIN 2.7* 3.1*     INFECTIOUS  Recent Labs Lab 03/25/14 1225 03/25/14 1540 03/25/14 2020 03/25/14 2331 03/26/14 0400 03/27/14 0410  LATICACIDVEN 1.24 0.61 1.2  --   --   --   PROCALCITON  --   --   --  2.35 2.63 1.51     ENDOCRINE CBG (last 3)  No results for input(s): GLUCAP in the last 72 hours.   IMAGING x48h Dg Chest 2 View  03/25/2014   CLINICAL DATA:  Fever since earlier this morning.  EXAM: CHEST  2 VIEW  COMPARISON:  02/21/2014.  FINDINGS: Normal sized heart. Stable linear scarring at the left lung base.  Interval neural stimulator leads in the lower thoracic spinal canal. Diffuse osteopenia. Thoracic spine degenerative changes.  IMPRESSION: No active cardiopulmonary disease.   Electronically Signed   By: Enrique Sack M.D.   On: 03/25/2014 10:44   Ct Thoracic Spine Wo Contrast  03/25/2014   CLINICAL DATA:  Sepsis, mid to low back pain, onset of weakness at 1300 hr yesterday, fever, nausea and dizziness with walking  EXAM: CT THORACIC AND LUMBAR SPINE WITHOUT CONTRAST  TECHNIQUE: Multidetector CT imaging of the thoracic and lumbar spine was performed without contrast. Multiplanar CT image reconstructions were also generated.  COMPARISON:  CT lumbar myelography 01/19/2014  FINDINGS: CT THORACIC SPINE FINDINGS  Twelve pairs of ribs.  Diffuse osseous demineralization.  Intraspinal stimulator lead at inferior T7 to superior T10.  Scattered disc space  narrowing and endplate spur formation throughout thoracic spine.  No acute fracture or subluxation.  No bone destruction seen to suggest vertebral osteomyelitis or discitis.  Dependent atelectasis in both lungs.  No abnormal paraspinal fluid collections.  No definite intraspinal abnormalities identified by noncontrast CT.  CT LUMBAR SPINE FINDINGS  Osseous demineralization.  Five non-rib-bearing lumbar vertebrae.  Prior L3-L4 posterior fusion and L4 laminectomy with intact hardware.  Superior endplate concavity at L4.  Vertebral body heights otherwise maintained without fracture or additional subluxation.  Multilevel facet degenerative changes lumbar spine.  No bone destruction or paraspinal fluid collections identified to suggest osteomyelitis or discitis.  L1-L2:  No abnormalities  L2-L3: Mild diffuse disc bulge. Ligamentum flavum thickening. No focal disc herniation or neural compression. AP narrowing of spinal canal, multifactorial.  L3-L4: Foramina grossly patent. No disc herniation or neural compression.  L4-L5: Significant artifacts from lumbar hardware and intraspinal stimulator generator pack. Diffuse disc bulge extending into LEFT neural foramen without compression of nerve root.  L5-S1: Retrolisthesis with disc space narrowing, vacuum phenomenon, endplate spurs and pseudo disc versus calcified disc fragment, narrowing AP diameter of spinal canal. No definite compression of the S1 roots as they enter the lateral recesses. Foramina patent.  BILATERAL hydronephrosis LEFT greater than RIGHT secondary to UPJ calculi, 9 x 6 mm LEFT and 7 x 8 mm RIGHT.  Additional BILATERAL renal calculi up to 10 mm diameter RIGHT and 11 mm diameter LEFT.  No ureteral dilatation.  Calcified leiomyoma in uterus.  Sigmoid diverticulosis.  No paraspinal fluid collections.  IMPRESSION: CT THORACIC SPINE IMPRESSION  Multilevel degenerative disc and facet disease changes.  Osseous demineralization.  No acute thoracic spine  abnormalities.  CT LUMBAR SPINE IMPRESSION  Prior L3-L4 fusion.  Multilevel degenerative disc and facet disease changes.  Bulging L4-L5 disc extending into LEFT neural foramen without neural compression.  Retrolisthesis with pseudo disc, endplate spurs and question focal disc herniation at L5-S1, causing no neural compression.  Multiple BILATERAL renal calculi as above with associated BILATERAL hydronephrosis LEFT greater than RIGHT due to large UPJ calculi bilaterally.  Sigmoid diverticulosis.  Findings called to [facility] on 03/25/2014 at 1550 hr.   Electronically Signed   By: Lavonia Dana M.D.   On: 03/25/2014 15:54   Ct Lumbar Spine Wo Contrast  03/25/2014   CLINICAL DATA:  Sepsis, mid to low back pain, onset of weakness at 1300 hr yesterday, fever, nausea and dizziness with walking  EXAM: CT THORACIC AND LUMBAR SPINE WITHOUT CONTRAST  TECHNIQUE: Multidetector CT imaging of the thoracic and lumbar spine was performed without contrast. Multiplanar CT image reconstructions were also generated.  COMPARISON:  CT lumbar myelography 01/19/2014  FINDINGS:  CT THORACIC SPINE FINDINGS  Twelve pairs of ribs.  Diffuse osseous demineralization.  Intraspinal stimulator lead at inferior T7 to superior T10.  Scattered disc space narrowing and endplate spur formation throughout thoracic spine.  No acute fracture or subluxation.  No bone destruction seen to suggest vertebral osteomyelitis or discitis.  Dependent atelectasis in both lungs.  No abnormal paraspinal fluid collections.  No definite intraspinal abnormalities identified by noncontrast CT.  CT LUMBAR SPINE FINDINGS  Osseous demineralization.  Five non-rib-bearing lumbar vertebrae.  Prior L3-L4 posterior fusion and L4 laminectomy with intact hardware.  Superior endplate concavity at L4.  Vertebral body heights otherwise maintained without fracture or additional subluxation.  Multilevel facet degenerative changes lumbar spine.  No bone destruction or paraspinal  fluid collections identified to suggest osteomyelitis or discitis.  L1-L2:  No abnormalities  L2-L3: Mild diffuse disc bulge. Ligamentum flavum thickening. No focal disc herniation or neural compression. AP narrowing of spinal canal, multifactorial.  L3-L4: Foramina grossly patent. No disc herniation or neural compression.  L4-L5: Significant artifacts from lumbar hardware and intraspinal stimulator generator pack. Diffuse disc bulge extending into LEFT neural foramen without compression of nerve root.  L5-S1: Retrolisthesis with disc space narrowing, vacuum phenomenon, endplate spurs and pseudo disc versus calcified disc fragment, narrowing AP diameter of spinal canal. No definite compression of the S1 roots as they enter the lateral recesses. Foramina patent.  BILATERAL hydronephrosis LEFT greater than RIGHT secondary to UPJ calculi, 9 x 6 mm LEFT and 7 x 8 mm RIGHT.  Additional BILATERAL renal calculi up to 10 mm diameter RIGHT and 11 mm diameter LEFT.  No ureteral dilatation.  Calcified leiomyoma in uterus.  Sigmoid diverticulosis.  No paraspinal fluid collections.  IMPRESSION: CT THORACIC SPINE IMPRESSION  Multilevel degenerative disc and facet disease changes.  Osseous demineralization.  No acute thoracic spine abnormalities.  CT LUMBAR SPINE IMPRESSION  Prior L3-L4 fusion.  Multilevel degenerative disc and facet disease changes.  Bulging L4-L5 disc extending into LEFT neural foramen without neural compression.  Retrolisthesis with pseudo disc, endplate spurs and question focal disc herniation at L5-S1, causing no neural compression.  Multiple BILATERAL renal calculi as above with associated BILATERAL hydronephrosis LEFT greater than RIGHT due to large UPJ calculi bilaterally.  Sigmoid diverticulosis.  Findings called to [facility] on 03/25/2014 at 1550 hr.   Electronically Signed   By: Lavonia Dana M.D.   On: 03/25/2014 15:54   US Renal  03/25/2014   CLINICAL DATA:  Acute kidney injury.  EXAM:  RENAL/URINARY TRACT ULTRASOUND COMPLETE  COMPARISON:  None.  FINDINGS: Right Kidney:  Length: 12.6 cm. Echogenicity within normal limits. Mild right hydronephrosis is noted.  Left Kidney:  Length: 12.8 cm. Echogenicity within normal limits. Moderate hydronephrosis is noted. Intrarenal calculi are noted.  Bladder:  Decompressed secondary to Foley catheter.  IMPRESSION: Mild right hydronephrosis is noted. Moderate left hydronephrosis is noted with intrarenal calculi. CT urogram is recommended for further evaluation.   Electronically Signed   By: Sabino Dick M.D.   On: 03/25/2014 21:35   Dg Chest Port 1 View  03/26/2014   CLINICAL DATA:  Subsequent evaluation of acute respiratory failure, history of hypertension  EXAM: PORTABLE CHEST - 1 VIEW  COMPARISON:  03/25/2014  FINDINGS: No change in the position of endotracheal tube. Enteric tube has been placed with tip into the stomach. Spinal stimulator stable.  Mild cardiac enlargement stable. Mild vascular congestion and interstitial prominence are improved when compared to the prior study. There is  increased hazy infiltrate in the right middle lobe. Retrocardiac medial left lower lobe opacity is stable.  IMPRESSION: Improvement in the severity of interstitial pulmonary edema.  Stable retrocardiac left lower lobe consolidation.  Increase in the volume and prominence of infiltrate in the right middle lobe. Possibilities include asymmetric alveolar edema or developing pneumonia/ pneumonitis.   Electronically Signed   By: Skipper Cliche M.D.   On: 03/26/2014 08:15   Dg Chest Port 1 View  03/25/2014   CLINICAL DATA:  Acute respiratory failure with hypoxia. Intubated patient.  EXAM: PORTABLE CHEST - 1 VIEW  COMPARISON:  02/21/2014  FINDINGS: Endotracheal tube is roughly 3.8 cm above the carina. Neural stimulator device in the lower thoracic spine. Low lung volumes with diffuse interstitial lung densities bilaterally. Findings are suggestive for areas of atelectasis  and edema. Heart size is within normal limits. Negative for a pneumothorax.  IMPRESSION: Slightly low lung volumes with evidence for interstitial edema and atelectasis.  Endotracheal tube is appropriately positioned.   Electronically Signed   By: Markus Daft M.D.   On: 03/25/2014 20:31      ASSESSMENT / PLAN:  PULMONARY OETT 03/25/2014 >> 11/28  A: acute post op resp failure due to sepsis, uti, hydro P:   pulm hygiene   CARDIOVASCULAR CVL   A: BAseline  - BP on ace inhibitor  - Hyperlipedemia  On aspirin and statin   septic shock at ER and recurrence post op in ICU. Improved 11/28 am P:  Plan restart home HCTZ 12/29, wait to restart ACE-i when renal fxn normalized  RENAL A:  Post obstructive renal failure - s/p ureteral stents 03/25/2014. Also on nephrotoxic gabapentin and ace inhibitor at home. Improving 11/28  P:   Fluids D/c foley Maintain MAP > 65 Avoid ace inhibitor, restart when acute events resolved  GASTROINTESTINAL A:   P:   Diet started  HEMATOLOGIC A:   Anemia of critical illness  P:  PRBC for hjgb  <7gm% Lovenox for dvt proph  INFECTIOUS Pct 2.35 11/27 BCx2 >> UC  11/27 >> enterococcus >>  MRSA PCR - negative  A:  Septic shock due to stones. uti P:   Zosyn, narrow as cx data indicates D/c'd Vanco 11/28, following  ENDOCRINE A:  No hx of dm  P:   Start SSI if CBG;s consistently > 180  NEUROLOGIC A:   Chronic back pain and multiple sx and oct 2015 s/p stimulator by Dr Rolena Infante Chronic CNS meds are  - celexa, neurontin, Robaxin, percocet, trazodone P:   Fent prn Ativan prn given cns polypharmacy Restart gabapentin and celexa RASS goal: 0   FAMILY  - Updates: patient 11/29  - Inter-disciplinary family meet or Palliative Care meeting due by:  04/01/14  OK to move to floor bed. Will ask [facility] to resume care as of   Baltazar Apo, MD, PhD 03/27/2014, 9:15 AM [facility] [phone] or if no answer  [phone]

## 2014-03-27 NOTE — Progress Notes (Signed)
eLink Physician-Brief Progress Note Patient Name: Kimberly Larsen DOB: 1945-09-20 MRN: [id number]   Date of Service  03/27/2014  HPI/Events of Note  Enterococcus UTI sensitive to ampicillin  eICU Interventions  Zosyn stopped Ampicillin ordered     Intervention Category Intermediate Interventions: Infection - evaluation and management  SHRADDHA, LEBRON, P 03/27/2014, 3:55 PM

## 2014-03-27 NOTE — Progress Notes (Signed)
2 Days Post-Op Subjective: Patient reports feeling well with no complaints at this time. Specifically she denies any flank pain or suprapubic discomfort.  Objective: Vital signs in last 24 hours: Temp:  [98.1 F (36.7 C)-101.6 F (38.7 C)] 101.6 F (38.7 C) (11/29 0400) Pulse Rate:  [65-114] 92 (11/29 0753) Resp:  [12-25] 22 (11/29 0753) BP: (112-175)/(47-95) 175/89 mmHg (11/29 0753) SpO2:  [94 %-100 %] 94 % (11/29 0753) Weight:  [95 kg (209 lb 7 oz)] 95 kg (209 lb 7 oz) (11/29 0400)  Intake/Output from previous day: 11/28 0701 - 11/29 0700 In: 2280 [P.O.:780; I.V.:1250; IV Piggyback:250] Out: 3350 [Urine:3350] Intake/Output this shift:    Physical Exam:  She was comfortably resting when initially seen. When awakened she was alert and oriented. Abdomen was soft and nontender. No CVAT. No suprapubic tenderness Foley catheter draining completely clear urine  Lab Results:  Recent Labs  03/25/14 1200 03/25/14 2020 03/26/14 0400  HGB 9.4* 11.6* 10.1*  HCT 29.7* 36.9 31.6*   BMET  Recent Labs  03/26/14 0400 03/27/14 0410  NA 142 142  K 3.5* 3.0*  CL 105 105  CO2 23 27  GLUCOSE 148* 147*  BUN 32* 17  CREATININE 2.04* 1.15*  CALCIUM 8.3* 8.7   No results for input(s): LABPT, INR in the last 72 hours. No results for input(s): LABURIN in the last 72 hours. Results for orders placed or performed during the hospital encounter of 03/25/14  Urine culture     Status: None (Preliminary result)   Collection Time: 03/25/14 11:42 AM  Result Value Ref Range Status   Specimen Description URINE, RANDOM  Final   Special Requests NONE  Final   Culture  Setup Time   Final    03/25/2014 17:15 Performed at [place]   Final    >=100,000 COLONIES/ML Performed at [facility]    Culture   Final    ENTEROCOCCUS SPECIES Performed at [facility]    Report Status PENDING  Incomplete  Blood Culture (routine x 2)     Status: None  (Preliminary result)   Collection Time: 03/25/14 12:00 PM  Result Value Ref Range Status   Specimen Description BLOOD LEFT HAND  Final   Special Requests BOTTLES DRAWN AEROBIC AND ANAEROBIC 5CC  Final   Culture  Setup Time   Final    03/25/2014 16:59 Performed at [facility]    Culture   Final           BLOOD CULTURE RECEIVED NO GROWTH TO DATE CULTURE WILL BE HELD FOR 5 DAYS BEFORE ISSUING A FINAL NEGATIVE REPORT Performed at [facility]    Report Status PENDING  Incomplete  Blood Culture (routine x 2)     Status: None (Preliminary result)   Collection Time: 03/25/14 12:05 PM  Result Value Ref Range Status   Specimen Description BLOOD RIGHT HAND  Final   Special Requests BOTTLES DRAWN AEROBIC AND ANAEROBIC 5CC  Final   Culture  Setup Time   Final    03/25/2014 17:00 Performed at [facility]    Culture   Final           BLOOD CULTURE RECEIVED NO GROWTH TO DATE CULTURE WILL BE HELD FOR 5 DAYS BEFORE ISSUING A FINAL NEGATIVE REPORT Performed at [facility]    Report Status PENDING  Incomplete  Urine culture     Status: None   Collection Time: 03/25/14  7:05 PM  Result Value Ref Range Status   Specimen Description URINE, CATHETERIZED RIGHT RENAL PELVIS  Final   Special Requests PATIENT ON FOLLOWING ZOSYN IV  Final   Culture  Setup Time   Final    03/25/2014 23:50 Performed at [place]   Final    9,000 COLONIES/ML Performed at [facility]    Culture   Final    INSIGNIFICANT GROWTH Performed at [facility]    Report Status 03/27/2014 FINAL  Final  Urine culture     Status: None (Preliminary result)   Collection Time: 03/25/14  7:05 PM  Result Value Ref Range Status   Specimen Description URINE, CATHETERIZED LEFT RENAL PELVIS  Final   Special Requests PATIENT ON FOLLOWING ZOSYN IV  Final   Culture  Setup Time   Final    03/25/2014 23:50 Performed at [place]  PENDING  Incomplete   Culture   Final    Culture reincubated for better growth Performed at [facility]    Report Status PENDING  Incomplete  Urine culture     Status: None (Preliminary result)   Collection Time: 03/25/14  7:11 PM  Result Value Ref Range Status   Specimen Description URINE, CATHETERIZED  Final   Special Requests PATIENT ON FOLLOWING ZOSYN  Final   Culture  Setup Time   Final    03/26/2014 02:34 Performed at [place] PENDING  Incomplete   Culture   Final    Culture reincubated for better growth Performed at [facility]    Report Status PENDING  Incomplete    Studies/Results: Dg Chest 2 View  03/25/2014   CLINICAL DATA:  Fever since earlier this morning.  EXAM: CHEST  2 VIEW  COMPARISON:  02/21/2014.  FINDINGS: Normal sized heart. Stable linear scarring at the left lung base. Interval neural stimulator leads in the lower thoracic spinal canal. Diffuse osteopenia. Thoracic spine degenerative changes.  IMPRESSION: No active cardiopulmonary disease.   Electronically Signed   By: Enrique Sack M.D.   On: 03/25/2014 10:44   Ct Thoracic Spine Wo Contrast  03/25/2014   CLINICAL DATA:  Sepsis, mid to low back pain, onset of weakness at 1300 hr yesterday, fever, nausea and dizziness with walking  EXAM: CT THORACIC AND LUMBAR SPINE WITHOUT CONTRAST  TECHNIQUE: Multidetector CT imaging of the thoracic and lumbar spine was performed without contrast. Multiplanar CT image reconstructions were also generated.  COMPARISON:  CT lumbar myelography 01/19/2014  FINDINGS: CT THORACIC SPINE FINDINGS  Twelve pairs of ribs.  Diffuse osseous demineralization.  Intraspinal stimulator lead at inferior T7 to superior T10.  Scattered disc space narrowing and endplate spur formation throughout thoracic spine.  No acute fracture or subluxation.  No bone destruction seen to suggest vertebral osteomyelitis or discitis.  Dependent atelectasis in both lungs.  No  abnormal paraspinal fluid collections.  No definite intraspinal abnormalities identified by noncontrast CT.  CT LUMBAR SPINE FINDINGS  Osseous demineralization.  Five non-rib-bearing lumbar vertebrae.  Prior L3-L4 posterior fusion and L4 laminectomy with intact hardware.  Superior endplate concavity at L4.  Vertebral body heights otherwise maintained without fracture or additional subluxation.  Multilevel facet degenerative changes lumbar spine.  No bone destruction or paraspinal fluid collections identified to suggest osteomyelitis or discitis.  L1-L2:  No abnormalities  L2-L3: Mild diffuse disc bulge. Ligamentum flavum thickening. No focal disc herniation  or neural compression. AP narrowing of spinal canal, multifactorial.  L3-L4: Foramina grossly patent. No disc herniation or neural compression.  L4-L5: Significant artifacts from lumbar hardware and intraspinal stimulator generator pack. Diffuse disc bulge extending into LEFT neural foramen without compression of nerve root.  L5-S1: Retrolisthesis with disc space narrowing, vacuum phenomenon, endplate spurs and pseudo disc versus calcified disc fragment, narrowing AP diameter of spinal canal. No definite compression of the S1 roots as they enter the lateral recesses. Foramina patent.  BILATERAL hydronephrosis LEFT greater than RIGHT secondary to UPJ calculi, 9 x 6 mm LEFT and 7 x 8 mm RIGHT.  Additional BILATERAL renal calculi up to 10 mm diameter RIGHT and 11 mm diameter LEFT.  No ureteral dilatation.  Calcified leiomyoma in uterus.  Sigmoid diverticulosis.  No paraspinal fluid collections.  IMPRESSION: CT THORACIC SPINE IMPRESSION  Multilevel degenerative disc and facet disease changes.  Osseous demineralization.  No acute thoracic spine abnormalities.  CT LUMBAR SPINE IMPRESSION  Prior L3-L4 fusion.  Multilevel degenerative disc and facet disease changes.  Bulging L4-L5 disc extending into LEFT neural foramen without neural compression.  Retrolisthesis with  pseudo disc, endplate spurs and question focal disc herniation at L5-S1, causing no neural compression.  Multiple BILATERAL renal calculi as above with associated BILATERAL hydronephrosis LEFT greater than RIGHT due to large UPJ calculi bilaterally.  Sigmoid diverticulosis.  Findings called to [facility] on 03/25/2014 at 1550 hr.   Electronically Signed   By: Lavonia Dana M.D.   On: 03/25/2014 15:54   Ct Lumbar Spine Wo Contrast  03/25/2014   CLINICAL DATA:  Sepsis, mid to low back pain, onset of weakness at 1300 hr yesterday, fever, nausea and dizziness with walking  EXAM: CT THORACIC AND LUMBAR SPINE WITHOUT CONTRAST  TECHNIQUE: Multidetector CT imaging of the thoracic and lumbar spine was performed without contrast. Multiplanar CT image reconstructions were also generated.  COMPARISON:  CT lumbar myelography 01/19/2014  FINDINGS: CT THORACIC SPINE FINDINGS  Twelve pairs of ribs.  Diffuse osseous demineralization.  Intraspinal stimulator lead at inferior T7 to superior T10.  Scattered disc space narrowing and endplate spur formation throughout thoracic spine.  No acute fracture or subluxation.  No bone destruction seen to suggest vertebral osteomyelitis or discitis.  Dependent atelectasis in both lungs.  No abnormal paraspinal fluid collections.  No definite intraspinal abnormalities identified by noncontrast CT.  CT LUMBAR SPINE FINDINGS  Osseous demineralization.  Five non-rib-bearing lumbar vertebrae.  Prior L3-L4 posterior fusion and L4 laminectomy with intact hardware.  Superior endplate concavity at L4.  Vertebral body heights otherwise maintained without fracture or additional subluxation.  Multilevel facet degenerative changes lumbar spine.  No bone destruction or paraspinal fluid collections identified to suggest osteomyelitis or discitis.  L1-L2:  No abnormalities  L2-L3: Mild diffuse disc bulge. Ligamentum flavum thickening. No focal disc herniation or neural compression. AP narrowing of spinal  canal, multifactorial.  L3-L4: Foramina grossly patent. No disc herniation or neural compression.  L4-L5: Significant artifacts from lumbar hardware and intraspinal stimulator generator pack. Diffuse disc bulge extending into LEFT neural foramen without compression of nerve root.  L5-S1: Retrolisthesis with disc space narrowing, vacuum phenomenon, endplate spurs and pseudo disc versus calcified disc fragment, narrowing AP diameter of spinal canal. No definite compression of the S1 roots as they enter the lateral recesses. Foramina patent.  BILATERAL hydronephrosis LEFT greater than RIGHT secondary to UPJ calculi, 9 x 6 mm LEFT and 7 x 8 mm RIGHT.  Additional BILATERAL renal calculi  up to 10 mm diameter RIGHT and 11 mm diameter LEFT.  No ureteral dilatation.  Calcified leiomyoma in uterus.  Sigmoid diverticulosis.  No paraspinal fluid collections.  IMPRESSION: CT THORACIC SPINE IMPRESSION  Multilevel degenerative disc and facet disease changes.  Osseous demineralization.  No acute thoracic spine abnormalities.  CT LUMBAR SPINE IMPRESSION  Prior L3-L4 fusion.  Multilevel degenerative disc and facet disease changes.  Bulging L4-L5 disc extending into LEFT neural foramen without neural compression.  Retrolisthesis with pseudo disc, endplate spurs and question focal disc herniation at L5-S1, causing no neural compression.  Multiple BILATERAL renal calculi as above with associated BILATERAL hydronephrosis LEFT greater than RIGHT due to large UPJ calculi bilaterally.  Sigmoid diverticulosis.  Findings called to [facility] on 03/25/2014 at 1550 hr.   Electronically Signed   By: Lavonia Dana M.D.   On: 03/25/2014 15:54   US Renal  03/25/2014   CLINICAL DATA:  Acute kidney injury.  EXAM: RENAL/URINARY TRACT ULTRASOUND COMPLETE  COMPARISON:  None.  FINDINGS: Right Kidney:  Length: 12.6 cm. Echogenicity within normal limits. Mild right hydronephrosis is noted.  Left Kidney:  Length: 12.8 cm. Echogenicity within normal  limits. Moderate hydronephrosis is noted. Intrarenal calculi are noted.  Bladder:  Decompressed secondary to Foley catheter.  IMPRESSION: Mild right hydronephrosis is noted. Moderate left hydronephrosis is noted with intrarenal calculi. CT urogram is recommended for further evaluation.   Electronically Signed   By: Sabino Dick M.D.   On: 03/25/2014 21:35   Dg Chest Port 1 View  03/26/2014   CLINICAL DATA:  Subsequent evaluation of acute respiratory failure, history of hypertension  EXAM: PORTABLE CHEST - 1 VIEW  COMPARISON:  03/25/2014  FINDINGS: No change in the position of endotracheal tube. Enteric tube has been placed with tip into the stomach. Spinal stimulator stable.  Mild cardiac enlargement stable. Mild vascular congestion and interstitial prominence are improved when compared to the prior study. There is increased hazy infiltrate in the right middle lobe. Retrocardiac medial left lower lobe opacity is stable.  IMPRESSION: Improvement in the severity of interstitial pulmonary edema.  Stable retrocardiac left lower lobe consolidation.  Increase in the volume and prominence of infiltrate in the right middle lobe. Possibilities include asymmetric alveolar edema or developing pneumonia/ pneumonitis.   Electronically Signed   By: Skipper Cliche M.D.   On: 03/26/2014 08:15   Dg Chest Port 1 View  03/25/2014   CLINICAL DATA:  Acute respiratory failure with hypoxia. Intubated patient.  EXAM: PORTABLE CHEST - 1 VIEW  COMPARISON:  02/21/2014  FINDINGS: Endotracheal tube is roughly 3.8 cm above the carina. Neural stimulator device in the lower thoracic spine. Low lung volumes with diffuse interstitial lung densities bilaterally. Findings are suggestive for areas of atelectasis and edema. Heart size is within normal limits. Negative for a pneumothorax.  IMPRESSION: Slightly low lung volumes with evidence for interstitial edema and atelectasis.  Endotracheal tube is appropriately positioned.   Electronically  Signed   By: Markus Daft M.D.   On: 03/25/2014 20:31    Assessment/Plan: She has made quite a remarkable turnaround. She seems to be doing quite well and tolerating her stents. Her urine is completely clear and her Foley catheter is no longer needed from urologic standpoint. Her urine cultures are all pending however her initial cultures of the urine is growing enterococcus.  Continue broad-spectrum antibiotics until culture sensitivities return.  Foley catheter may be removed if no longer needed to monitor urine output.  Would  agree she is stable enough to be transferred to the floor.  I did discuss with her briefly the need to address her stones as an outpatient.   LOS: 2 days   Calin Ellery C 03/27/2014, 9:03 AM

## 2014-03-27 NOTE — Progress Notes (Signed)
[facility] Progress Note Patient Name: Kimberly Larsen DOB: 1945/08/23 MRN: [id number]   Date of Service  03/27/2014  HPI/Events of Note  k 3  eICU Interventions  supp k      Intervention Category Intermediate Interventions: Electrolyte abnormality - evaluation and management  Raylene Miyamoto. 03/27/2014, 5:23 AM

## 2014-03-28 ENCOUNTER — Encounter (HOSPITAL_COMMUNITY): Payer: Self-pay | Admitting: Urology

## 2014-03-28 ENCOUNTER — Inpatient Hospital Stay (HOSPITAL_COMMUNITY): Payer: Medicare Other

## 2014-03-28 DIAGNOSIS — E785 Hyperlipidemia, unspecified: Secondary | ICD-10-CM

## 2014-03-28 LAB — BASIC METABOLIC PANEL
Anion gap: 8 (ref 5–15)
BUN: 12 mg/dL (ref 6–23)
CO2: 31 mEq/L (ref 19–32)
Calcium: 9.2 mg/dL (ref 8.4–10.5)
Chloride: 99 mEq/L (ref 96–112)
Creatinine, Ser: 0.9 mg/dL (ref 0.50–1.10)
GFR calc Af Amer: 76 mL/min — ABNORMAL LOW (ref >90)
GFR calc non Af Amer: 65 mL/min — ABNORMAL LOW (ref >90)
Glucose, Bld: 141 mg/dL — ABNORMAL HIGH (ref 70–99)
Potassium: 2.7 mEq/L — CL (ref 3.7–5.3)
Sodium: 138 mEq/L (ref 137–147)

## 2014-03-28 LAB — CBC
HCT: 30.3 % — ABNORMAL LOW (ref 36.0–46.0)
Hemoglobin: 10 g/dL — ABNORMAL LOW (ref 12.0–15.0)
MCH: 29.3 pg (ref 26.0–34.0)
MCHC: 33 g/dL (ref 30.0–36.0)
MCV: 88.9 fL (ref 78.0–100.0)
Platelets: 187 10*3/uL (ref 150–400)
RBC: 3.41 MIL/uL — ABNORMAL LOW (ref 3.87–5.11)
RDW: 15 % (ref 11.5–15.5)
WBC: 9.6 10*3/uL (ref 4.0–10.5)

## 2014-03-28 LAB — PHOSPHORUS: Phosphorus: 1.5 mg/dL — ABNORMAL LOW (ref 2.3–4.6)

## 2014-03-28 LAB — MAGNESIUM: Magnesium: 1.7 mg/dL (ref 1.5–2.5)

## 2014-03-28 LAB — POTASSIUM: Potassium: 3.2 mEq/L — ABNORMAL LOW (ref 3.7–5.3)

## 2014-03-28 MED ORDER — POTASSIUM CHLORIDE 10 MEQ/100ML IV SOLN
10.0000 meq | INTRAVENOUS | Status: AC
  Administered 2014-03-28 (×4): 10 meq via INTRAVENOUS
  Filled 2014-03-28 (×4): qty 100

## 2014-03-28 MED ORDER — FLUTICASONE PROPIONATE 50 MCG/ACT NA SUSP
1.0000 | Freq: Every day | NASAL | Status: DC
  Administered 2014-03-28 – 2014-03-29 (×2): 1 via NASAL
  Filled 2014-03-28: qty 16

## 2014-03-28 MED ORDER — MENTHOL 3 MG MT LOZG
1.0000 | LOZENGE | OROMUCOSAL | Status: DC | PRN
  Filled 2014-03-28: qty 9

## 2014-03-28 MED ORDER — BENZONATATE 100 MG PO CAPS: 100.0000 mg | ORAL_CAPSULE | Freq: Three times a day (TID) | ORAL | Status: DC | PRN

## 2014-03-28 MED ORDER — MAGNESIUM SULFATE 50 % IJ SOLN
3.0000 g | Freq: Once | INTRAVENOUS | Status: AC
  Administered 2014-03-28: 3 g via INTRAVENOUS
  Filled 2014-03-28: qty 6

## 2014-03-28 MED ORDER — POTASSIUM CHLORIDE CRYS ER 20 MEQ PO TBCR
40.0000 meq | EXTENDED_RELEASE_TABLET | ORAL | Status: AC
  Administered 2014-03-28 (×2): 40 meq via ORAL
  Filled 2014-03-28 (×2): qty 2

## 2014-03-28 MED ORDER — POTASSIUM CHLORIDE CRYS ER 20 MEQ PO TBCR
40.0000 meq | EXTENDED_RELEASE_TABLET | Freq: Once | ORAL | Status: AC
  Administered 2014-03-28: 40 meq via ORAL
  Filled 2014-03-28: qty 2

## 2014-03-28 MED ORDER — AMOXICILLIN-POT CLAVULANATE 875-125 MG PO TABS
1.0000 | ORAL_TABLET | Freq: Two times a day (BID) | ORAL | Status: DC
  Administered 2014-03-28 – 2014-03-29 (×3): 1 via ORAL
  Filled 2014-03-28 (×4): qty 1

## 2014-03-28 NOTE — Progress Notes (Signed)
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 Patient also reported left upper quadrant abdominal pain for approximately 2 weeks prior to admission. Patient was recently diagnosed with a UTI on 03/11/14 and started on ciprofloxacin. Upon arrival to ED, patient was noted to be hypotensive with systolic blood pressure of 80s, temperature 101.5, serum creatinine 3.1, WBC count 13.6, blood cultures and urine cultures were obtained, patient was started on IV vancomycin and Zosyn. CT of the lumbar spine showed bilateral hydronephrosis with bilateral proximal ureteral stones. Urology was consulted. Renal ultrasound showed mild right hydronephrosis, moderate left hydronephrosis. Patient was seen by Dr. Alinda Money, underwent cystoscopy and bilateral ureteral stent placement on 11/27. Critical care was admitted in PACU, and patient was admitted to ICU. Patient was placed on vasopressors due to hypotension/circulatory/septic shock. Urine culture showed enterococcus. Patient has significantly improved, transferred to floor on 03/27/09, [facility] assumed care on 11/30  Assessment/Plan: Principal Problem:   Severe sepsis with septic shock - Significantly improved, off vasopressors, - Urine culture showed enterococcus sensitive to fluoroquinolones and ampicillin - Patient was seen by urology today, transitioned to oral antibiotics for 14 days  Active Problems:   AKI (acute kidney injury) with obstructive uropathy, bilateral hydronephrosis, ureteral stents - Creatinine function has improved, 0.9 - Urology recommended KUB today, pending  Severe hypokalemia with hypomagnesemia - Potassium 2.7 today, magnesium 1.7 placed on IV and oral aggressive  replacements, will recheck potassium again today    Benign essential HTN -Currently stable     Acute postoperative respiratory failure -Resolved, extubated  Generalized weakness - Patient lives alone at home, will start physical therapy evaluation today    DVT Prophylaxis:  Code Status:Full code   Family Communication:Discussed with patient daughter at the bedside   Disposition:Hopefully tomorrow   Consultants:  Urology    critical care  Procedures:  Bilateral ureteral stents placement   Antibiotics:  IV ampicillin dc 11/30   Augmentin  11/30 >    Subjective:Patient seen and examined, feeling somewhat better, having some postnasal drip and hacking cough, abdominal pain is improving  Objective  Weight change:   Intake/Output Summary (Last 24 hours) at 03/28/14 1136 Last data filed at 03/28/14 1017  Gross per 24 hour  Intake    460 ml  Output   3200 ml  Net  -2740 ml   Blood pressure 151/68, pulse 83, temperature 98.6 F (37 C), temperature source Oral, resp. rate 18, height 5\' 2"  (1.575 m), weight 95 kg (209 lb 7 oz), SpO2 97 %.  Physical Exam: General: Alert and awake, oriented x3, not in any acute distress. CVS: S1-S2 clear, no murmur rubs or gallops Chest: clear to auscultation bilaterally, no wheezing, rales or rhonchi Abdomen: soft nontender, nondistended, normal bowel sounds  Extremities: no cyanosis, clubbing or edema noted bilaterally Neuro: Cranial nerves II-XII intact, no focal neurological deficits  Lab Results: Basic Metabolic Panel:  Recent Labs Lab 03/27/14 0410 03/28/14 0505  NA 142 138  K 3.0* 2.7*  CL 105 99  CO2 27 31  GLUCOSE 147* 141*  BUN 17 12  CREATININE 1.15* 0.90  CALCIUM 8.7 9.2  MG 1.8 1.7  PHOS 1.5* 1.5*   Liver Function Tests:  Recent Labs Lab  03/25/14 1200 03/25/14 2020  AST 28 53*  ALT 14 29  ALKPHOS 79 98  BILITOT 0.4 0.5  PROT 5.4* 6.8  ALBUMIN 2.7* 3.1*   No results for input(s): LIPASE,  AMYLASE in the last 168 hours. No results for input(s): AMMONIA in the last 168 hours. CBC:  Recent Labs Lab 03/25/14 1200  03/26/14 0400 03/28/14 0505  WBC 13.6*  < > 12.1* 9.6  NEUTROABS 12.0*  --   --   --   HGB 9.4*  < > 10.1* 10.0*  HCT 29.7*  < > 31.6* 30.3*  MCV 87.9  < > 89.0 88.9  PLT 226  < > 180 187  < > = values in this interval not displayed. Cardiac Enzymes: No results for input(s): CKTOTAL, CKMB, CKMBINDEX, TROPONINI in the last 168 hours. BNP: Invalid input(s): POCBNP CBG: No results for input(s): GLUCAP in the last 168 hours.   Micro Results: Recent Results (from the past 240 hour(s))  Urine culture     Status: None   Collection Time: 03/25/14 11:42 AM  Result Value Ref Range Status   Specimen Description URINE, RANDOM  Final   Special Requests NONE  Final   Culture  Setup Time   Final    03/25/2014 17:15 Performed at [place]   Final    >=100,000 COLONIES/ML Performed at [facility]    Culture   Final    ENTEROCOCCUS SPECIES Performed at [facility]    Report Status 03/27/2014 FINAL  Final   Organism ID, Bacteria ENTEROCOCCUS SPECIES  Final      Susceptibility   Enterococcus species - MIC*    AMPICILLIN <=2 SENSITIVE Sensitive     LEVOFLOXACIN 1 SENSITIVE Sensitive     NITROFURANTOIN <=16 SENSITIVE Sensitive     VANCOMYCIN 2 SENSITIVE Sensitive     TETRACYCLINE >=16 RESISTANT Resistant     * ENTEROCOCCUS SPECIES  Blood Culture (routine x 2)     Status: None (Preliminary result)   Collection Time: 03/25/14 12:00 PM  Result Value Ref Range Status   Specimen Description BLOOD LEFT HAND  Final   Special Requests BOTTLES DRAWN AEROBIC AND ANAEROBIC 5CC  Final   Culture  Setup Time   Final    03/25/2014 16:59 Performed at [facility]    Culture   Final           BLOOD CULTURE RECEIVED NO GROWTH TO DATE CULTURE WILL BE HELD FOR 5 DAYS BEFORE ISSUING A FINAL NEGATIVE REPORT Performed at  [facility]    Report Status PENDING  Incomplete  Blood Culture (routine x 2)     Status: None (Preliminary result)   Collection Time: 03/25/14 12:05 PM  Result Value Ref Range Status   Specimen Description BLOOD RIGHT HAND  Final   Special Requests BOTTLES DRAWN AEROBIC AND ANAEROBIC 5CC  Final   Culture  Setup Time   Final    03/25/2014 17:00 Performed at [facility]    Culture   Final           BLOOD CULTURE RECEIVED NO GROWTH TO DATE CULTURE WILL BE HELD FOR 5 DAYS BEFORE ISSUING A FINAL NEGATIVE REPORT Performed at [facility]    Report Status PENDING  Incomplete  Urine culture     Status: None   Collection Time: 03/25/14  7:05 PM  Result Value Ref Range Status   Specimen Description URINE, CATHETERIZED RIGHT RENAL  PELVIS  Final   Special Requests PATIENT ON FOLLOWING ZOSYN IV  Final   Culture  Setup Time   Final    03/25/2014 23:50 Performed at [place]   Final    9,000 COLONIES/ML Performed at [facility]    Culture   Final    INSIGNIFICANT GROWTH Performed at [facility]    Report Status 03/27/2014 FINAL  Final  Urine culture     Status: None (Preliminary result)   Collection Time: 03/25/14  7:05 PM  Result Value Ref Range Status   Specimen Description URINE, CATHETERIZED LEFT RENAL PELVIS  Final   Special Requests PATIENT ON FOLLOWING ZOSYN IV  Final   Culture  Setup Time   Final    03/25/2014 23:50 Performed at [place]   Final    45,000 COLONIES/ML Performed at [facility]    Culture   Final    ENTEROCOCCUS SPECIES Performed at [facility]    Report Status PENDING  Incomplete  Urine culture     Status: None (Preliminary result)   Collection Time: 03/25/14  7:11 PM  Result Value Ref Range Status   Specimen Description URINE, CATHETERIZED  Final   Special Requests PATIENT ON FOLLOWING ZOSYN  Final   Culture  Setup Time   Final     03/26/2014 02:34 Performed at [place] PENDING  Incomplete   Culture   Final    Culture reincubated for better growth Performed at [facility]    Report Status PENDING  Incomplete    Studies/Results: Dg Chest 2 View  03/25/2014   CLINICAL DATA:  Fever since earlier this morning.  EXAM: CHEST  2 VIEW  COMPARISON:  02/21/2014.  FINDINGS: Normal sized heart. Stable linear scarring at the left lung base. Interval neural stimulator leads in the lower thoracic spinal canal. Diffuse osteopenia. Thoracic spine degenerative changes.  IMPRESSION: No active cardiopulmonary disease.   Electronically Signed   By: Enrique Sack M.D.   On: 03/25/2014 10:44   Ct Thoracic Spine Wo Contrast  03/25/2014   CLINICAL DATA:  Sepsis, mid to low back pain, onset of weakness at 1300 hr yesterday, fever, nausea and dizziness with walking  EXAM: CT THORACIC AND LUMBAR SPINE WITHOUT CONTRAST  TECHNIQUE: Multidetector CT imaging of the thoracic and lumbar spine was performed without contrast. Multiplanar CT image reconstructions were also generated.  COMPARISON:  CT lumbar myelography 01/19/2014  FINDINGS: CT THORACIC SPINE FINDINGS  Twelve pairs of ribs.  Diffuse osseous demineralization.  Intraspinal stimulator lead at inferior T7 to superior T10.  Scattered disc space narrowing and endplate spur formation throughout thoracic spine.  No acute fracture or subluxation.  No bone destruction seen to suggest vertebral osteomyelitis or discitis.  Dependent atelectasis in both lungs.  No abnormal paraspinal fluid collections.  No definite intraspinal abnormalities identified by noncontrast CT.  CT LUMBAR SPINE FINDINGS  Osseous demineralization.  Five non-rib-bearing lumbar vertebrae.  Prior L3-L4 posterior fusion and L4 laminectomy with intact hardware.  Superior endplate concavity at L4.  Vertebral body heights otherwise maintained without fracture or additional subluxation.  Multilevel facet  degenerative changes lumbar spine.  No bone destruction or paraspinal fluid collections identified to suggest osteomyelitis or discitis.  L1-L2:  No abnormalities  L2-L3: Mild diffuse disc bulge. Ligamentum flavum thickening. No focal disc herniation or neural compression. AP narrowing of spinal canal,  multifactorial.  L3-L4: Foramina grossly patent. No disc herniation or neural compression.  L4-L5: Significant artifacts from lumbar hardware and intraspinal stimulator generator pack. Diffuse disc bulge extending into LEFT neural foramen without compression of nerve root.  L5-S1: Retrolisthesis with disc space narrowing, vacuum phenomenon, endplate spurs and pseudo disc versus calcified disc fragment, narrowing AP diameter of spinal canal. No definite compression of the S1 roots as they enter the lateral recesses. Foramina patent.  BILATERAL hydronephrosis LEFT greater than RIGHT secondary to UPJ calculi, 9 x 6 mm LEFT and 7 x 8 mm RIGHT.  Additional BILATERAL renal calculi up to 10 mm diameter RIGHT and 11 mm diameter LEFT.  No ureteral dilatation.  Calcified leiomyoma in uterus.  Sigmoid diverticulosis.  No paraspinal fluid collections.  IMPRESSION: CT THORACIC SPINE IMPRESSION  Multilevel degenerative disc and facet disease changes.  Osseous demineralization.  No acute thoracic spine abnormalities.  CT LUMBAR SPINE IMPRESSION  Prior L3-L4 fusion.  Multilevel degenerative disc and facet disease changes.  Bulging L4-L5 disc extending into LEFT neural foramen without neural compression.  Retrolisthesis with pseudo disc, endplate spurs and question focal disc herniation at L5-S1, causing no neural compression.  Multiple BILATERAL renal calculi as above with associated BILATERAL hydronephrosis LEFT greater than RIGHT due to large UPJ calculi bilaterally.  Sigmoid diverticulosis.  Findings called to [facility] on 03/25/2014 at 1550 hr.   Electronically Signed   By: Lavonia Dana M.D.   On: 03/25/2014 15:54   Ct  Lumbar Spine Wo Contrast  03/25/2014   CLINICAL DATA:  Sepsis, mid to low back pain, onset of weakness at 1300 hr yesterday, fever, nausea and dizziness with walking  EXAM: CT THORACIC AND LUMBAR SPINE WITHOUT CONTRAST  TECHNIQUE: Multidetector CT imaging of the thoracic and lumbar spine was performed without contrast. Multiplanar CT image reconstructions were also generated.  COMPARISON:  CT lumbar myelography 01/19/2014  FINDINGS: CT THORACIC SPINE FINDINGS  Twelve pairs of ribs.  Diffuse osseous demineralization.  Intraspinal stimulator lead at inferior T7 to superior T10.  Scattered disc space narrowing and endplate spur formation throughout thoracic spine.  No acute fracture or subluxation.  No bone destruction seen to suggest vertebral osteomyelitis or discitis.  Dependent atelectasis in both lungs.  No abnormal paraspinal fluid collections.  No definite intraspinal abnormalities identified by noncontrast CT.  CT LUMBAR SPINE FINDINGS  Osseous demineralization.  Five non-rib-bearing lumbar vertebrae.  Prior L3-L4 posterior fusion and L4 laminectomy with intact hardware.  Superior endplate concavity at L4.  Vertebral body heights otherwise maintained without fracture or additional subluxation.  Multilevel facet degenerative changes lumbar spine.  No bone destruction or paraspinal fluid collections identified to suggest osteomyelitis or discitis.  L1-L2:  No abnormalities  L2-L3: Mild diffuse disc bulge. Ligamentum flavum thickening. No focal disc herniation or neural compression. AP narrowing of spinal canal, multifactorial.  L3-L4: Foramina grossly patent. No disc herniation or neural compression.  L4-L5: Significant artifacts from lumbar hardware and intraspinal stimulator generator pack. Diffuse disc bulge extending into LEFT neural foramen without compression of nerve root.  L5-S1: Retrolisthesis with disc space narrowing, vacuum phenomenon, endplate spurs and pseudo disc versus calcified disc fragment,  narrowing AP diameter of spinal canal. No definite compression of the S1 roots as they enter the lateral recesses. Foramina patent.  BILATERAL hydronephrosis LEFT greater than RIGHT secondary to UPJ calculi, 9 x 6 mm LEFT and 7 x 8 mm RIGHT.  Additional BILATERAL renal calculi up to 10 mm diameter RIGHT and 11  mm diameter LEFT.  No ureteral dilatation.  Calcified leiomyoma in uterus.  Sigmoid diverticulosis.  No paraspinal fluid collections.  IMPRESSION: CT THORACIC SPINE IMPRESSION  Multilevel degenerative disc and facet disease changes.  Osseous demineralization.  No acute thoracic spine abnormalities.  CT LUMBAR SPINE IMPRESSION  Prior L3-L4 fusion.  Multilevel degenerative disc and facet disease changes.  Bulging L4-L5 disc extending into LEFT neural foramen without neural compression.  Retrolisthesis with pseudo disc, endplate spurs and question focal disc herniation at L5-S1, causing no neural compression.  Multiple BILATERAL renal calculi as above with associated BILATERAL hydronephrosis LEFT greater than RIGHT due to large UPJ calculi bilaterally.  Sigmoid diverticulosis.  Findings called to [facility] on 03/25/2014 at 1550 hr.   Electronically Signed   By: Lavonia Dana M.D.   On: 03/25/2014 15:54   US Renal  03/25/2014   CLINICAL DATA:  Acute kidney injury.  EXAM: RENAL/URINARY TRACT ULTRASOUND COMPLETE  COMPARISON:  None.  FINDINGS: Right Kidney:  Length: 12.6 cm. Echogenicity within normal limits. Mild right hydronephrosis is noted.  Left Kidney:  Length: 12.8 cm. Echogenicity within normal limits. Moderate hydronephrosis is noted. Intrarenal calculi are noted.  Bladder:  Decompressed secondary to Foley catheter.  IMPRESSION: Mild right hydronephrosis is noted. Moderate left hydronephrosis is noted with intrarenal calculi. CT urogram is recommended for further evaluation.   Electronically Signed   By: Sabino Dick M.D.   On: 03/25/2014 21:35   Dg Chest Port 1 View  03/26/2014   CLINICAL DATA:   Subsequent evaluation of acute respiratory failure, history of hypertension  EXAM: PORTABLE CHEST - 1 VIEW  COMPARISON:  03/25/2014  FINDINGS: No change in the position of endotracheal tube. Enteric tube has been placed with tip into the stomach. Spinal stimulator stable.  Mild cardiac enlargement stable. Mild vascular congestion and interstitial prominence are improved when compared to the prior study. There is increased hazy infiltrate in the right middle lobe. Retrocardiac medial left lower lobe opacity is stable.  IMPRESSION: Improvement in the severity of interstitial pulmonary edema.  Stable retrocardiac left lower lobe consolidation.  Increase in the volume and prominence of infiltrate in the right middle lobe. Possibilities include asymmetric alveolar edema or developing pneumonia/ pneumonitis.   Electronically Signed   By: Skipper Cliche M.D.   On: 03/26/2014 08:15   Dg Chest Port 1 View  03/25/2014   CLINICAL DATA:  Acute respiratory failure with hypoxia. Intubated patient.  EXAM: PORTABLE CHEST - 1 VIEW  COMPARISON:  02/21/2014  FINDINGS: Endotracheal tube is roughly 3.8 cm above the carina. Neural stimulator device in the lower thoracic spine. Low lung volumes with diffuse interstitial lung densities bilaterally. Findings are suggestive for areas of atelectasis and edema. Heart size is within normal limits. Negative for a pneumothorax.  IMPRESSION: Slightly low lung volumes with evidence for interstitial edema and atelectasis.  Endotracheal tube is appropriately positioned.   Electronically Signed   By: Markus Daft M.D.   On: 03/25/2014 20:31    Medications: Scheduled Meds: . amoxicillin-clavulanate  1 tablet Oral Q12H  . aspirin EC  81 mg Oral Daily  . citalopram  40 mg Oral Daily  . enoxaparin (LOVENOX) injection  0.5 mg/kg Subcutaneous Q24H  . gabapentin  600 mg Oral TID  . hydrochlorothiazide  12.5 mg Oral Daily  . magnesium sulfate LVP 250-500 ml  3 g Intravenous Once  . multivitamin  with minerals  1 tablet Oral Daily  . potassium chloride  40 mEq Oral  Q4H  . sodium chloride  3 mL Intravenous Q12H      LOS: 3 days   Alireza Pollack M.D. [facility] 03/28/2014, 11:36 AM Pager: [phone]  If 7PM-7AM, please contact night-coverage [email or web address] Password [email or web address]

## 2014-03-28 NOTE — Progress Notes (Signed)
Physical Therapy Treatment Patient Details Name: Kimberly Larsen MRN: [id number] DOB: 1945-09-27 Today's Date: 03/28/2014    History of Present Illness Indication: Kimberly Larsen is a 68 y.o. patient with fever and developing sepsis, bilateral ureteral obstruction due to calculi, and an acute kidney injury. After reviewing the management options for treatment, she elected to proceed with the  bilateral ureteral stends.    PT Comments    Pt was able to increase gait tolerance to 100 feet in the hallway without an AD.  Pt was supervision-min guard A with all mobility.  Tx limited due to incontinence.  O2 sats averaged 88% on RA; pt asymptomatic.      Follow Up Recommendations  Home health PT;Supervision/Assistance - 24 hour     Equipment Recommendations  None recommended by PT    Recommendations for Other Services       Precautions / Restrictions Precautions Precautions: Fall Precaution Comments: monitor sats, just weaned from vent 11/28. Restrictions Weight Bearing Restrictions: No    Mobility  Bed Mobility Overal bed mobility: Needs Assistance Bed Mobility: Supine to Sit     Supine to sit: Supervision     General bed mobility comments: increased time; VCs for forward scoot  Transfers Overall transfer level: Modified independent Equipment used: None Transfers: Sit to/from Stand Sit to Stand: Modified independent (Device/Increase time)            Ambulation/Gait Ambulation/Gait assistance: Min guard Ambulation Distance (Feet): 100 Feet Assistive device: None Gait Pattern/deviations: Step-through pattern;Decreased stride length     General Gait Details: tx limited due to incontinence; assisted pt back to room and notified CNA to A with hygiene;;  RA O2 sats averaged 88%; VCs for breathing techniques   Stairs            Wheelchair Mobility    Modified Rankin (Stroke Patients Only)       Balance                                     Cognition Arousal/Alertness: Awake/alert Behavior During Therapy: WFL for tasks assessed/performed Overall Cognitive Status: Within Functional Limits for tasks assessed                      Exercises      General Comments        Pertinent Vitals/Pain Pain Assessment: No/denies pain    Home Living                      Prior Function            PT Goals (current goals can now be found in the care plan section) Progress towards PT goals: Progressing toward goals    Frequency  Min 3X/week    PT Plan Current plan remains appropriate    Co-evaluation             End of Session Equipment Utilized During Treatment: Gait belt Activity Tolerance: Patient tolerated treatment well Patient left: with nursing/sitter in room (in bathroom)     Time: 0865-7846 PT Time Calculation (min) (ACUTE ONLY): 19 min  Charges:                       G Codes:      Miller,Derrick, SPTA 03/28/2014, 1:05 PM   Reviewed above  Rica Koyanagi  PTA [facility]  Acute  Rehab Pager      [phone]

## 2014-03-28 NOTE — Progress Notes (Signed)
[name], female   DOB: [birth year], 68 y.o.   MRN: [id number]  3 Days Post-Op Subjective: Pt continuing to improve after stent placement on Friday.  Denies flank pain.  Does have some generalized abdominal pain.  Objective: Vital signs in last 24 hours: Temp:  [98 F (36.7 C)-98.6 F (37 C)] 98.6 F (37 C) (11/30 0431) Pulse Rate:  [81-92] 83 (11/30 0431) Resp:  [17-20] 18 (11/30 0431) BP: (143-176)/(63-95) 151/68 mmHg (11/30 0431) SpO2:  [93 %-99 %] 97 % (11/30 0431)  Intake/Output from previous day: 11/29 0701 - 11/30 0700 In: 785 [P.O.:240; I.V.:345; IV Piggyback:200] Out: 2850 [Urine:2850] Intake/Output this shift: Total I/O In: -  Out: 900 [Urine:900]  Physical Exam:  General: Alert and oriented Abdomen: Soft, ND, NT  Lab Results:  Recent Labs  03/25/14 2020 03/26/14 0400 03/28/14 0505  HGB 11.6* 10.1* 10.0*  HCT 36.9 31.6* 30.3*   Lab Results  Component Value Date   WBC 9.6 03/28/2014   HGB 10.0* 03/28/2014   HCT 30.3* 03/28/2014   MCV 88.9 03/28/2014   PLT 187 03/28/2014     BMET  Recent Labs  03/27/14 0410 03/28/14 0505  NA 142 138  K 3.0* 2.7*  CL 105 99  CO2 27 31  GLUCOSE 147* 141*  BUN 17 12  CREATININE 1.15* 0.90  CALCIUM 8.7 9.2   Urine culture: Enterococcus sensitive to levofloxacin and ampicillin   Assessment/Plan: 1) Bilateral ureteral and renal calculi: Will proceed with definitive treatment once infection resolved (will arrange as outpatient). KUB x-ray today. 2) Sepsis/renal infection: S/P bilateral ureteral stents.  She is clinically much improved over the last 48 hours. Ok to transition to oral levofloxacin or amoxicillin.  Will need 14 days of total antibiotic treatment. [place] for discharge from urologic standpoint.  I will arrange outpatient follow up for her.     LOS: 3 days   Shaneca Orne,LES 03/28/2014, 10:16 AM

## 2014-03-29 LAB — URINE CULTURE: Colony Count: 45000

## 2014-03-29 LAB — BASIC METABOLIC PANEL
Anion gap: 10 (ref 5–15)
BUN: 12 mg/dL (ref 6–23)
CO2: 31 mEq/L (ref 19–32)
Calcium: 9.2 mg/dL (ref 8.4–10.5)
Chloride: 103 mEq/L (ref 96–112)
Creatinine, Ser: 0.8 mg/dL (ref 0.50–1.10)
GFR calc Af Amer: 87 mL/min — ABNORMAL LOW (ref >90)
GFR calc non Af Amer: 75 mL/min — ABNORMAL LOW (ref >90)
Glucose, Bld: 103 mg/dL — ABNORMAL HIGH (ref 70–99)
Potassium: 3.9 mEq/L (ref 3.7–5.3)
Sodium: 144 mEq/L (ref 137–147)

## 2014-03-29 LAB — CBC
HCT: 33.2 % — ABNORMAL LOW (ref 36.0–46.0)
Hemoglobin: 10.5 g/dL — ABNORMAL LOW (ref 12.0–15.0)
MCH: 27.8 pg (ref 26.0–34.0)
MCHC: 31.6 g/dL (ref 30.0–36.0)
MCV: 87.8 fL (ref 78.0–100.0)
Platelets: 216 10*3/uL (ref 150–400)
RBC: 3.78 MIL/uL — ABNORMAL LOW (ref 3.87–5.11)
RDW: 15.1 % (ref 11.5–15.5)
WBC: 8.2 10*3/uL (ref 4.0–10.5)

## 2014-03-29 MED ORDER — HYDROCHLOROTHIAZIDE 12.5 MG PO CAPS: 25.0000 mg | ORAL_CAPSULE | Freq: Every day | ORAL | Status: DC

## 2014-03-29 MED ORDER — AMLODIPINE BESYLATE 10 MG PO TABS: 10.0000 mg | ORAL_TABLET | Freq: Every day | ORAL | Status: DC

## 2014-03-29 MED ORDER — POTASSIUM CHLORIDE ER 20 MEQ PO TBCR: 20.0000 meq | EXTENDED_RELEASE_TABLET | Freq: Every day | ORAL | Status: DC

## 2014-03-29 MED ORDER — METHOCARBAMOL 500 MG PO TABS: 500.0000 mg | ORAL_TABLET | Freq: Three times a day (TID) | ORAL | Status: DC | PRN

## 2014-03-29 MED ORDER — AMOXICILLIN-POT CLAVULANATE 875-125 MG PO TABS: 1.0000 | ORAL_TABLET | Freq: Two times a day (BID) | ORAL | Status: DC

## 2014-03-29 MED ORDER — AMLODIPINE BESYLATE 10 MG PO TABS
10.0000 mg | ORAL_TABLET | Freq: Every day | ORAL | Status: DC
  Administered 2014-03-29: 10 mg via ORAL
  Filled 2014-03-29: qty 1

## 2014-03-29 MED ORDER — OXYCODONE-ACETAMINOPHEN 10-325 MG PO TABS: 1.0000 | ORAL_TABLET | Freq: Four times a day (QID) | ORAL | Status: DC | PRN

## 2014-03-29 MED ORDER — ONDANSETRON HCL 4 MG PO TABS: 4.0000 mg | ORAL_TABLET | Freq: Three times a day (TID) | ORAL | Status: DC | PRN

## 2014-03-29 NOTE — Discharge Summary (Signed)
Reviewed discharge instructions with pt/family.  Answered all questions and they verbalized understanding.  Pt still with HTN, but asymptomatic (MD aware).  Dr. Alinda Money reported that there were no additional d/c instructions regarding stent. Pt to follow-up with him.  Pt d/c into care of family

## 2014-03-29 NOTE — Discharge Summary (Signed)
PATIENT DETAILS Name: Kimberly Larsen Age: 68 y.o. Sex: female Date of Birth: 16-Feb-1946 MRN: [id number]. Admitting Physician: Orson Eva, MD OEH:[id number],GNOIBB, MD  Admit Date: 03/25/2014 Discharge date: 03/29/2014  Recommendations for Outpatient Follow-up:  Discontinued Ace inhibitor for now, please optimize blood pressure regimen-now on HCTZ and amlodipine. Please recheck electrolytes and CBC in 1 week  PRIMARY DISCHARGE DIAGNOSIS:  Principal Problem:   Severe sepsis with septic shock Active Problems:   AKI (acute kidney injury)   Benign essential HTN   Obstructive uropathy   Hyperlipidemia   Acute postoperative respiratory failure      PAST MEDICAL HISTORY: Past Medical History  Diagnosis Date  . Hypertension   . Arthritis   . HLD (hyperlipidemia)   . History of kidney stones   . Chronic kidney disease   . Anxiety     DISCHARGE MEDICATIONS: Current Discharge Medication List    START taking these medications   Details  amLODipine (NORVASC) 10 MG tablet Take 1 tablet (10 mg total) by mouth daily. Qty: 30 tablet, Refills: 0    amoxicillin-clavulanate (AUGMENTIN) 875-125 MG per tablet Take 1 tablet by mouth every 12 (twelve) hours. Qty: 26 tablet, Refills: 0    hydrochlorothiazide (MICROZIDE) 12.5 MG capsule Take 2 capsules (25 mg total) by mouth daily. Qty: 60 capsule, Refills: 0    potassium chloride 20 MEQ TBCR Take 20 mEq by mouth daily. Qty: 30 tablet, Refills: 0      CONTINUE these medications which have CHANGED   Details  methocarbamol (ROBAXIN) 500 MG tablet Take 1 tablet (500 mg total) by mouth 3 (three) times daily as needed for muscle spasms. Qty: 30 tablet, Refills: 0    oxyCODONE-acetaminophen (PERCOCET) 10-325 MG per tablet Take 1 tablet by mouth every 6 (six) hours as needed for pain. Qty: 30 tablet, Refills: 0      CONTINUE these medications which have NOT CHANGED   Details  aspirin EC 81 MG tablet Take 81 mg by mouth daily.    B  Complex-C (SUPER B COMPLEX) TABS Take 1 tablet by mouth daily. Refills: 0    Biotin 5000 MCG TABS Take 2 tablets by mouth daily. Qty: 30 tablet    citalopram (CELEXA) 40 MG tablet Take 1 tablet by mouth daily. Refills: 0    gabapentin (NEURONTIN) 600 MG tablet Take 600 mg by mouth 3 (three) times daily.    loratadine (CLARITIN) 10 MG tablet Take 10 mg by mouth daily as needed for allergies.     Melatonin 10 MG CAPS Take 10 mg by mouth at bedtime.    Multiple Vitamin (MULTIVITAMIN WITH MINERALS) TABS Take 1 tablet by mouth daily.    Omega-3 Fatty Acids (FISH OIL) 600 MG CAPS Take 1 capsule by mouth 2 (two) times daily. Qty: 30 capsule    ondansetron (ZOFRAN) 4 MG tablet Take 1 tablet (4 mg total) by mouth every 8 (eight) hours as needed for nausea or vomiting. Qty: 20 tablet, Refills: 0    pravastatin (PRAVACHOL) 40 MG tablet Take 1 tablet (40 mg total) by mouth at bedtime. Qty: 30 tablet, Refills: 0    traZODone (DESYREL) 50 MG tablet Take 0.5-1 tablets (25-50 mg total) by mouth at bedtime.    vitamin C (ASCORBIC ACID) 500 MG tablet Take 500 mg by mouth daily.    Zinc 50 MG CAPS Take 1 capsule (50 mg total) by mouth daily.      STOP taking these medications     quinapril-hydrochlorothiazide (ACCURETIC)  20-12.5 MG per tablet         ALLERGIES:  No Known Allergies  BRIEF HPI:  See H&P, Labs, Consult and Test reports for all details in brief, patient is a 68 year old female with a history of hypertension, dyslipidemia, prior history of nephrolithiasis brought to the hospital on 11/27 with fever, chills and anorexia. Briefly hypotensive in the emergency room, responded with IV fluids. Further workup revealed bilateral obstructive uropathy secondary to ureteral calculi, patient was then seen in consultation by urology, and transferred to [place]:   Urology  PCCM  PERTINENT RADIOLOGIC STUDIES: Dg Chest 2 View  03/25/2014   CLINICAL DATA:   Fever since earlier this morning.  EXAM: CHEST  2 VIEW  COMPARISON:  02/21/2014.  FINDINGS: Normal sized heart. Stable linear scarring at the left lung base. Interval neural stimulator leads in the lower thoracic spinal canal. Diffuse osteopenia. Thoracic spine degenerative changes.  IMPRESSION: No active cardiopulmonary disease.   Electronically Signed   By: Enrique Sack M.D.   On: 03/25/2014 10:44   Dg Abd 1 View  03/28/2014   CLINICAL DATA:  Stent placement 3 days ago.  Urolithiasis.  EXAM: ABDOMEN - 1 VIEW  COMPARISON:  03/25/2014 ultrasound.  CT 03/25/2014.  FINDINGS: Bilateral double-J ureteral stents are present. Bilateral renal collecting system calculi are unchanged. The distal loop of the RIGHT ureteral stent terminates inferior to the pubic symphysis which may be within a cystocele or at the urethral orifice.  The bilateral renal collecting system calculi are in similar position of the prior CT. LEFT ureteral calculus is no longer visible. Spinal stimulator incidentally noted.  IMPRESSION: Bilateral double-J ureteral stents with the LEFT stent loops appropriately reconstituted. The RIGHT stent proximal loop is near the expected location of the RIGHT renal pelvis however the distal loop is not in the urinary bladder and is probably at the urethral orifice. This could also be within a cystocele.  These results were called by telephone at the time of interpretation on 03/28/2014 at 12:36 pm to Nurse Windell Moulding, who verbally acknowledged these results. We were unable to reach Dr. Alinda Money by phone.   Electronically Signed   By: Dereck Ligas M.D.   On: 03/28/2014 12:38   Ct Thoracic Spine Wo Contrast  03/25/2014   CLINICAL DATA:  Sepsis, mid to low back pain, onset of weakness at 1300 hr yesterday, fever, nausea and dizziness with walking  EXAM: CT THORACIC AND LUMBAR SPINE WITHOUT CONTRAST  TECHNIQUE: Multidetector CT imaging of the thoracic and lumbar spine was performed without contrast. Multiplanar  CT image reconstructions were also generated.  COMPARISON:  CT lumbar myelography 01/19/2014  FINDINGS: CT THORACIC SPINE FINDINGS  Twelve pairs of ribs.  Diffuse osseous demineralization.  Intraspinal stimulator lead at inferior T7 to superior T10.  Scattered disc space narrowing and endplate spur formation throughout thoracic spine.  No acute fracture or subluxation.  No bone destruction seen to suggest vertebral osteomyelitis or discitis.  Dependent atelectasis in both lungs.  No abnormal paraspinal fluid collections.  No definite intraspinal abnormalities identified by noncontrast CT.  CT LUMBAR SPINE FINDINGS  Osseous demineralization.  Five non-rib-bearing lumbar vertebrae.  Prior L3-L4 posterior fusion and L4 laminectomy with intact hardware.  Superior endplate concavity at L4.  Vertebral body heights otherwise maintained without fracture or additional subluxation.  Multilevel facet degenerative changes lumbar spine.  No bone destruction or paraspinal fluid collections identified to suggest osteomyelitis or discitis.  L1-L2:  No abnormalities  L2-L3: Mild diffuse disc bulge. Ligamentum flavum thickening. No focal disc herniation or neural compression. AP narrowing of spinal canal, multifactorial.  L3-L4: Foramina grossly patent. No disc herniation or neural compression.  L4-L5: Significant artifacts from lumbar hardware and intraspinal stimulator generator pack. Diffuse disc bulge extending into LEFT neural foramen without compression of nerve root.  L5-S1: Retrolisthesis with disc space narrowing, vacuum phenomenon, endplate spurs and pseudo disc versus calcified disc fragment, narrowing AP diameter of spinal canal. No definite compression of the S1 roots as they enter the lateral recesses. Foramina patent.  BILATERAL hydronephrosis LEFT greater than RIGHT secondary to UPJ calculi, 9 x 6 mm LEFT and 7 x 8 mm RIGHT.  Additional BILATERAL renal calculi up to 10 mm diameter RIGHT and 11 mm diameter LEFT.  No  ureteral dilatation.  Calcified leiomyoma in uterus.  Sigmoid diverticulosis.  No paraspinal fluid collections.  IMPRESSION: CT THORACIC SPINE IMPRESSION  Multilevel degenerative disc and facet disease changes.  Osseous demineralization.  No acute thoracic spine abnormalities.  CT LUMBAR SPINE IMPRESSION  Prior L3-L4 fusion.  Multilevel degenerative disc and facet disease changes.  Bulging L4-L5 disc extending into LEFT neural foramen without neural compression.  Retrolisthesis with pseudo disc, endplate spurs and question focal disc herniation at L5-S1, causing no neural compression.  Multiple BILATERAL renal calculi as above with associated BILATERAL hydronephrosis LEFT greater than RIGHT due to large UPJ calculi bilaterally.  Sigmoid diverticulosis.  Findings called to [facility] on 03/25/2014 at 1550 hr.   Electronically Signed   By: Lavonia Dana M.D.   On: 03/25/2014 15:54   Ct Lumbar Spine Wo Contrast  03/25/2014   CLINICAL DATA:  Sepsis, mid to low back pain, onset of weakness at 1300 hr yesterday, fever, nausea and dizziness with walking  EXAM: CT THORACIC AND LUMBAR SPINE WITHOUT CONTRAST  TECHNIQUE: Multidetector CT imaging of the thoracic and lumbar spine was performed without contrast. Multiplanar CT image reconstructions were also generated.  COMPARISON:  CT lumbar myelography 01/19/2014  FINDINGS: CT THORACIC SPINE FINDINGS  Twelve pairs of ribs.  Diffuse osseous demineralization.  Intraspinal stimulator lead at inferior T7 to superior T10.  Scattered disc space narrowing and endplate spur formation throughout thoracic spine.  No acute fracture or subluxation.  No bone destruction seen to suggest vertebral osteomyelitis or discitis.  Dependent atelectasis in both lungs.  No abnormal paraspinal fluid collections.  No definite intraspinal abnormalities identified by noncontrast CT.  CT LUMBAR SPINE FINDINGS  Osseous demineralization.  Five non-rib-bearing lumbar vertebrae.  Prior L3-L4 posterior  fusion and L4 laminectomy with intact hardware.  Superior endplate concavity at L4.  Vertebral body heights otherwise maintained without fracture or additional subluxation.  Multilevel facet degenerative changes lumbar spine.  No bone destruction or paraspinal fluid collections identified to suggest osteomyelitis or discitis.  L1-L2:  No abnormalities  L2-L3: Mild diffuse disc bulge. Ligamentum flavum thickening. No focal disc herniation or neural compression. AP narrowing of spinal canal, multifactorial.  L3-L4: Foramina grossly patent. No disc herniation or neural compression.  L4-L5: Significant artifacts from lumbar hardware and intraspinal stimulator generator pack. Diffuse disc bulge extending into LEFT neural foramen without compression of nerve root.  L5-S1: Retrolisthesis with disc space narrowing, vacuum phenomenon, endplate spurs and pseudo disc versus calcified disc fragment, narrowing AP diameter of spinal canal. No definite compression of the S1 roots as they enter the lateral recesses. Foramina patent.  BILATERAL hydronephrosis LEFT greater than RIGHT secondary to UPJ calculi, 9 x 6 mm  LEFT and 7 x 8 mm RIGHT.  Additional BILATERAL renal calculi up to 10 mm diameter RIGHT and 11 mm diameter LEFT.  No ureteral dilatation.  Calcified leiomyoma in uterus.  Sigmoid diverticulosis.  No paraspinal fluid collections.  IMPRESSION: CT THORACIC SPINE IMPRESSION  Multilevel degenerative disc and facet disease changes.  Osseous demineralization.  No acute thoracic spine abnormalities.  CT LUMBAR SPINE IMPRESSION  Prior L3-L4 fusion.  Multilevel degenerative disc and facet disease changes.  Bulging L4-L5 disc extending into LEFT neural foramen without neural compression.  Retrolisthesis with pseudo disc, endplate spurs and question focal disc herniation at L5-S1, causing no neural compression.  Multiple BILATERAL renal calculi as above with associated BILATERAL hydronephrosis LEFT greater than RIGHT due to large  UPJ calculi bilaterally.  Sigmoid diverticulosis.  Findings called to [facility] PA on 03/25/2014 at 1550 hr.   Electronically Signed   By: Lavonia Dana M.D.   On: 03/25/2014 15:54   US Renal  03/25/2014   CLINICAL DATA:  Acute kidney injury.  EXAM: RENAL/URINARY TRACT ULTRASOUND COMPLETE  COMPARISON:  None.  FINDINGS: Right Kidney:  Length: 12.6 cm. Echogenicity within normal limits. Mild right hydronephrosis is noted.  Left Kidney:  Length: 12.8 cm. Echogenicity within normal limits. Moderate hydronephrosis is noted. Intrarenal calculi are noted.  Bladder:  Decompressed secondary to Foley catheter.  IMPRESSION: Mild right hydronephrosis is noted. Moderate left hydronephrosis is noted with intrarenal calculi. CT urogram is recommended for further evaluation.   Electronically Signed   By: Sabino Dick M.D.   On: 03/25/2014 21:35   Dg Chest Port 1 View  03/26/2014   CLINICAL DATA:  Subsequent evaluation of acute respiratory failure, history of hypertension  EXAM: PORTABLE CHEST - 1 VIEW  COMPARISON:  03/25/2014  FINDINGS: No change in the position of endotracheal tube. Enteric tube has been placed with tip into the stomach. Spinal stimulator stable.  Mild cardiac enlargement stable. Mild vascular congestion and interstitial prominence are improved when compared to the prior study. There is increased hazy infiltrate in the right middle lobe. Retrocardiac medial left lower lobe opacity is stable.  IMPRESSION: Improvement in the severity of interstitial pulmonary edema.  Stable retrocardiac left lower lobe consolidation.  Increase in the volume and prominence of infiltrate in the right middle lobe. Possibilities include asymmetric alveolar edema or developing pneumonia/ pneumonitis.   Electronically Signed   By: Skipper Cliche M.D.   On: 03/26/2014 08:15   Dg Chest Port 1 View  03/25/2014   CLINICAL DATA:  Acute respiratory failure with hypoxia. Intubated patient.  EXAM: PORTABLE CHEST - 1 VIEW  COMPARISON:   02/21/2014  FINDINGS: Endotracheal tube is roughly 3.8 cm above the carina. Neural stimulator device in the lower thoracic spine. Low lung volumes with diffuse interstitial lung densities bilaterally. Findings are suggestive for areas of atelectasis and edema. Heart size is within normal limits. Negative for a pneumothorax.  IMPRESSION: Slightly low lung volumes with evidence for interstitial edema and atelectasis.  Endotracheal tube is appropriately positioned.   Electronically Signed   By: Markus Daft M.D.   On: 03/25/2014 20:31     PERTINENT LAB RESULTS: CBC:  Recent Labs  03/28/14 0505 03/29/14 0408  WBC 9.6 8.2  HGB 10.0* 10.5*  HCT 30.3* 33.2*  PLT 187 216   CMET CMP     Component Value Date/Time   NA 144 03/29/2014 0408   K 3.9 03/29/2014 0408   CL 103 03/29/2014 0408   CO2 31 03/29/2014  0408   GLUCOSE 103* 03/29/2014 0408   BUN 12 03/29/2014 0408   CREATININE 0.80 03/29/2014 0408   CALCIUM 9.2 03/29/2014 0408   PROT 6.8 03/25/2014 2020   ALBUMIN 3.1* 03/25/2014 2020   AST 53* 03/25/2014 2020   ALT 29 03/25/2014 2020   ALKPHOS 98 03/25/2014 2020   BILITOT 0.5 03/25/2014 2020   GFRNONAA 75* 03/29/2014 0408   GFRAA 87* 03/29/2014 0408    GFR Estimated Creatinine Clearance: 74.4 mL/min (by C-G formula based on Cr of 0.8). No results for input(s): LIPASE, AMYLASE in the last 72 hours. No results for input(s): CKTOTAL, CKMB, CKMBINDEX, TROPONINI in the last 72 hours. Invalid input(s): POCBNP No results for input(s): DDIMER in the last 72 hours. No results for input(s): HGBA1C in the last 72 hours. No results for input(s): CHOL, HDL, LDLCALC, TRIG, CHOLHDL, LDLDIRECT in the last 72 hours. No results for input(s): TSH, T4TOTAL, T3FREE, THYROIDAB in the last 72 hours.  Invalid input(s): FREET3 No results for input(s): VITAMINB12, FOLATE, FERRITIN, TIBC, IRON, RETICCTPCT in the last 72 hours. Coags: No results for input(s): INR in the last 72 hours.  Invalid  input(s): PT Microbiology: Recent Results (from the past 240 hour(s))  Urine culture     Status: None   Collection Time: 03/25/14 11:42 AM  Result Value Ref Range Status   Specimen Description URINE, RANDOM  Final   Special Requests NONE  Final   Culture  Setup Time   Final    03/25/2014 17:15 Performed at [place]   Final    >=100,000 COLONIES/ML Performed at [facility]    Culture   Final    ENTEROCOCCUS SPECIES Performed at [facility]    Report Status 03/27/2014 FINAL  Final   Organism ID, Bacteria ENTEROCOCCUS SPECIES  Final      Susceptibility   Enterococcus species - MIC*    AMPICILLIN <=2 SENSITIVE Sensitive     LEVOFLOXACIN 1 SENSITIVE Sensitive     NITROFURANTOIN <=16 SENSITIVE Sensitive     VANCOMYCIN 2 SENSITIVE Sensitive     TETRACYCLINE >=16 RESISTANT Resistant     * ENTEROCOCCUS SPECIES  Blood Culture (routine x 2)     Status: None (Preliminary result)   Collection Time: 03/25/14 12:00 PM  Result Value Ref Range Status   Specimen Description BLOOD LEFT HAND  Final   Special Requests BOTTLES DRAWN AEROBIC AND ANAEROBIC 5CC  Final   Culture  Setup Time   Final    03/25/2014 16:59 Performed at [facility]    Culture   Final           BLOOD CULTURE RECEIVED NO GROWTH TO DATE CULTURE WILL BE HELD FOR 5 DAYS BEFORE ISSUING A FINAL NEGATIVE REPORT Performed at [facility]    Report Status PENDING  Incomplete  Blood Culture (routine x 2)     Status: None (Preliminary result)   Collection Time: 03/25/14 12:05 PM  Result Value Ref Range Status   Specimen Description BLOOD RIGHT HAND  Final   Special Requests BOTTLES DRAWN AEROBIC AND ANAEROBIC 5CC  Final   Culture  Setup Time   Final    03/25/2014 17:00 Performed at [facility]    Culture   Final           BLOOD CULTURE RECEIVED NO GROWTH TO DATE CULTURE WILL BE HELD FOR 5 DAYS BEFORE ISSUING A FINAL NEGATIVE REPORT Performed at  [facility]    Report Status PENDING  Incomplete  Urine culture     Status: None   Collection Time: 03/25/14  7:05 PM  Result Value Ref Range Status   Specimen Description URINE, CATHETERIZED RIGHT RENAL PELVIS  Final   Special Requests PATIENT ON FOLLOWING ZOSYN IV  Final   Culture  Setup Time   Final    03/25/2014 23:50 Performed at [place]   Final    9,000 COLONIES/ML Performed at [facility]    Culture   Final    INSIGNIFICANT GROWTH Performed at [facility]    Report Status 03/27/2014 FINAL  Final  Urine culture     Status: None   Collection Time: 03/25/14  7:05 PM  Result Value Ref Range Status   Specimen Description URINE, CATHETERIZED LEFT RENAL PELVIS  Final   Special Requests PATIENT ON FOLLOWING ZOSYN IV  Final   Culture  Setup Time   Final    03/25/2014 23:50 Performed at [place]   Final    45,000 COLONIES/ML Performed at [facility]    Culture   Final    ENTEROCOCCUS SPECIES Performed at [facility]    Report Status 03/29/2014 FINAL  Final   Organism ID, Bacteria ENTEROCOCCUS SPECIES  Final      Susceptibility   Enterococcus species - MIC*    AMPICILLIN <=2 SENSITIVE Sensitive     LEVOFLOXACIN 1 SENSITIVE Sensitive     NITROFURANTOIN <=16 SENSITIVE Sensitive     VANCOMYCIN 2 SENSITIVE Sensitive     TETRACYCLINE >=16 RESISTANT Resistant     * ENTEROCOCCUS SPECIES  Urine culture     Status: None   Collection Time: 03/25/14  7:11 PM  Result Value Ref Range Status   Specimen Description URINE, CATHETERIZED  Final   Special Requests PATIENT ON FOLLOWING ZOSYN  Final   Culture  Setup Time   Final    03/26/2014 02:34 Performed at [place]   Final    20,OOO COLONIES/ML Performed at [facility]    Culture   Final    ENTEROCOCCUS SPECIES Performed at [facility]    Report Status 03/29/2014 FINAL  Final     Organism ID, Bacteria ENTEROCOCCUS SPECIES  Final      Susceptibility   Enterococcus species - MIC*    AMPICILLIN <=2 SENSITIVE Sensitive     LEVOFLOXACIN 1 SENSITIVE Sensitive     NITROFURANTOIN <=16 SENSITIVE Sensitive     VANCOMYCIN 2 SENSITIVE Sensitive     TETRACYCLINE >=16 RESISTANT Resistant     * ENTEROCOCCUS SPECIES     BRIEF HOSPITAL COURSE:   Principal Problem:   Severe sepsis with septic shock: Secondary to UTI in a setting of obstructive uropathy. Transiently required pressors postoperatively and managed in the intensive care unit by PCCM. Currently doing well, septic pathophysiology has resolved. Urine culture positive for enterococcus, will be on Augmentin for a total of 14 days.  Active Problems:   Bilateral ureteral calculi: Patient presented with sepsis, further  radiological studies including a CT scan revealed bilateral ureteral obstruction due to renal calculi and acute renal failure. Urology was consulted, patient underwent cystoscopy and bilateral ureteral stent placement on 11/27. Postoperatively, she developed acute respiratory failure and required mechanical ventilation briefly. She however quickly improved, a abdominal x-ray done on 11/30 demonstrated migration of the right ureteral  stent, patient was also complaining of stress incontinence and frequency of urination.Urology saw this patient on 12/1, and Dr Alinda Money managed manipulate her stent back in position. Following this procedure, frequency of urination significantly improved. She is being discharged home in a stable manner, she is to continue with oral antibiotics for a total of 14 days. Urology will arrange for outpatient follow-up within 2 weeks.   Acute renal failure: Resolved. Likely secondary to obstructive uropathy.    Acute respiratory failure: In the setting of sepsis and post operatively. Briefly required mechanical ventilation. O2 saturation stable on room air. Lungs clear in exam.    Anemia:  Likely secondary to critical illness. Please recheck CBC in 1 week    Hypertension: Blood pressure medications initially held because of sepsis and renal failure. She has been restarted on HCTZ, I have added amlodipine on day of discharge. We will temporarily continue to hold ACE inhibitor for now until her urological issues are stable. She will need close titration of her antihypertensive medications in the outpatient setting.    Hypokalemia: Likely secondary to HCTZ. Have added KCl supplementation to her discharge medications.     Generalized weakness: Seen by physical therapy-recommended home health services. Seen by this M.D. this morning to be walking independently. Per case management, patient refused home health services.     Chronic back pain-status post spinal cord stimulator: Continue narcotics and muscle relaxants.  TODAY-DAY OF DISCHARGE:  Subjective:   Kimberly Larsen today has no headache,no chest abdominal pain,no new weakness tingling or numbness, feels much better wants to go home today.   Objective:   Blood pressure 163/80, pulse 73, temperature 98.2 F (36.8 C), temperature source Oral, resp. rate 18, height 5\' 2"  (1.575 m), weight 95 kg (209 lb 7 oz), SpO2 98 %.  Intake/Output Summary (Last 24 hours) at 03/29/14 1343 Last data filed at 03/29/14 1200  Gross per 24 hour  Intake 1774.2 ml  Output   1650 ml  Net  124.2 ml   Filed Weights   03/25/14 1338 03/26/14 0600 03/27/14 0400  Weight: 89.359 kg (197 lb) 95.4 kg (210 lb 5.1 oz) 95 kg (209 lb 7 oz)    Exam Awake Alert, Oriented *3, No new F.N deficits, Normal affect [place].AT,PERRAL Supple Neck,No JVD, No cervical lymphadenopathy appriciated.  Symmetrical Chest wall movement, Good air movement bilaterally, CTAB RRR,No Gallops,Rubs or new Murmurs, No Parasternal Heave +ve B.Sounds, Abd Soft, Non tender, No organomegaly appriciated, No rebound -guarding or rigidity. No Cyanosis, Clubbing or edema, No new Rash or  bruise  DISCHARGE CONDITION: Stable  DISPOSITION: Home with home health services  DISCHARGE INSTRUCTIONS:    Activity:  As tolerated   Diet recommendation: Heart Healthy diet   Follow-up Information    Follow up with Dutch Gray, MD.   Specialty:  Urology   Why:  Will call to arrange for about 2 weeks from now.   Contact information:   [address] [place] [address] [phone]       [facility]. Schedule an appointment as soon as possible for a visit in 1 week.   Specialty:  Family Medicine   Contact information:   [place] [address] [phone]       Total Time spent on discharge equals 45 minutes.  SignedOren Binet 03/29/2014 1:43 PM

## 2014-03-29 NOTE — Discharge Instructions (Signed)

## 2014-03-29 NOTE — Progress Notes (Signed)
[name], female   DOB: Feb 16, 1946, 68 y.o.   MRN: [id number]  [name] has developed significant incontinence overnight.  I reviewed her KUB x-ray which demonstrates her left ureteral stent to be in appropriate position.  Her stones are radiopaque.  Her right-sided stent appears to have migrated distally into the urethra.  The proximal curl of the stent appears to be within the renal pelvis.  Her previously noted proximal ureteral calculus that was obstructing is now in the renal pelvis in a nonobstructing position.  I examined her today at the bedside with a nurse present.  Her stent could be seen extending from her urethra.  I was able to manually manipulate her stent back into the urethra and then used a catheter push it back into the bladder.  We will observe her this morning to see if her incontinence has resolved.  If so, she can safely be discharged on antibiotic therapy with appropriate follow-up which I will arrange in 2 weeks.  If she continues to have incontinence of a significant degree, we will plan to address it and possibly remove her ureteral stent which likely is offering minimal benefit at this time considering that her stone has been manipulated into her renal collecting system on the right side.

## 2014-03-29 NOTE — Care Management Note (Signed)
    Page 1 of 1   03/29/2014     11:02:58 AM CARE MANAGEMENT NOTE 03/29/2014  Patient:  Kimberly Larsen, Kimberly Larsen   Account Number:  [id number]  Date Initiated:  03/28/2014  Documentation initiated by:  Sunday Spillers  Subjective/Objective Assessment:   68 yo female admitted with sepsis, resp failure. PTA lived at home with adult children.     Action/Plan:   Home when stable   Anticipated DC Date:  03/31/2014   Anticipated DC Plan:  [place]  CM consult      [facility] Choice  HOME HEALTH   Choice offered to / List presented to:  C-1 Patient        [place] arranged  [place] - 11 Patient Refused      Status of service:  Completed, signed off Medicare Important Message given?  YES (If response is "NO", the following Medicare IM given date fields will be blank) Date Medicare IM given:  03/29/2014 Medicare IM given by:  [facility] Date Additional Medicare IM given:   Additional Medicare IM given by:    Discharge Disposition:  HOME/SELF CARE  Per UR Regulation:  Reviewed for med. necessity/level of care/duration of stay  If discussed at [place] of Stay Meetings, dates discussed:    Comments:  03-29-14 Sunday Spillers RN [place] [address] Spoke with patient at bedside regarding recommendations for [facility] PT. Patient does not feel PT is necessary, she feels she moves about independently, RN present during conversation and in agreement. No other HH needs identified.

## 2014-03-31 LAB — CULTURE, BLOOD (ROUTINE X 2)
Culture: NO GROWTH
Culture: NO GROWTH

## 2014-04-04 DIAGNOSIS — N390 Urinary tract infection, site not specified: Secondary | ICD-10-CM | POA: Diagnosis not present

## 2014-04-04 DIAGNOSIS — N132 Hydronephrosis with renal and ureteral calculous obstruction: Secondary | ICD-10-CM | POA: Diagnosis not present

## 2014-04-05 ENCOUNTER — Other Ambulatory Visit: Payer: Self-pay | Admitting: Urology

## 2014-04-07 DIAGNOSIS — A4189 Other specified sepsis: Secondary | ICD-10-CM | POA: Diagnosis not present

## 2014-04-07 DIAGNOSIS — I10 Essential (primary) hypertension: Secondary | ICD-10-CM | POA: Diagnosis not present

## 2014-04-07 DIAGNOSIS — F5101 Primary insomnia: Secondary | ICD-10-CM | POA: Diagnosis not present

## 2014-04-07 DIAGNOSIS — E876 Hypokalemia: Secondary | ICD-10-CM | POA: Diagnosis not present

## 2014-04-14 ENCOUNTER — Encounter (HOSPITAL_COMMUNITY): Payer: Self-pay

## 2014-04-14 ENCOUNTER — Encounter (HOSPITAL_COMMUNITY)
Admission: RE | Admit: 2014-04-14 | Discharge: 2014-04-14 | Disposition: A | Discharge status: Home / Self Care | Payer: Medicare Other | Source: Ambulatory Visit | Attending: Urology | Admitting: Urology

## 2014-04-14 DIAGNOSIS — Z01812 Encounter for preprocedural laboratory examination: Secondary | ICD-10-CM | POA: Insufficient documentation

## 2014-04-14 HISTORY — DX: Major depressive disorder, single episode, unspecified: F32.9

## 2014-04-14 HISTORY — DX: Depression, unspecified: F32.A

## 2014-04-14 LAB — BASIC METABOLIC PANEL
Anion gap: 12 (ref 5–15)
BUN: 18 mg/dL (ref 6–23)
CO2: 27 mEq/L (ref 19–32)
Calcium: 10.1 mg/dL (ref 8.4–10.5)
Chloride: 100 mEq/L (ref 96–112)
Creatinine, Ser: 0.82 mg/dL (ref 0.50–1.10)
GFR calc Af Amer: 85 mL/min — ABNORMAL LOW (ref >90)
GFR calc non Af Amer: 73 mL/min — ABNORMAL LOW (ref >90)
Glucose, Bld: 98 mg/dL (ref 70–99)
Potassium: 3.8 mEq/L (ref 3.7–5.3)
Sodium: 139 mEq/L (ref 137–147)

## 2014-04-14 NOTE — Patient Instructions (Signed)
[place]  04/14/2014   Your procedure is scheduled on:   04-18-2014 Monday  Enter through [facility] Entrance and follow signs to [facility]. Arrive at    [place]    AM ..  Call this number if you have problems the morning of surgery: [phone]  Or Presurgical Testing [phone].   For Living Will and/or Health Care Power Attorney Forms: please provide copy for your medical record,may bring AM of surgery(Forms should be already notarized -we do not provide this service).(04-14-14 Yes, will bring copy of HCPOA form AM of 04-18-14).     Do not eat food/ or drink: After Midnight.     Take these medicines the morning of surgery with A SIP OF WATER: Amlodipine. Citalopram. Gabapentin. Oxycodone(if needed). Use flonase if need.   Do not wear jewelry, make-up or nail polish.  Do not wear deodorant, lotions, powders, or perfumes.   Do not shave legs and under arms- 48 hours(2 days) prior to first CHG shower.(Shaving face and neck okay.)  Do not bring valuables to the hospital.(Hospital is not responsible for lost valuables).  Contacts, dentures or removable bridgework, body piercing, hair pins may not be worn into surgery.  Leave suitcase in the car. After surgery it may be brought to your room.  For patients admitted to the hospital, checkout time is 11:00 AM the day of discharge.(Restricted visitors-Any Persons displaying flu-like symptoms or illness).    Patients discharged the day of surgery will not be allowed to drive home. Must have responsible person with you x 24 hours once discharged.  Name and phone number of your driver: Iverna Hammac , daughter [phone] cell     Please read over the following fact sheets that you were given:  CHG(Chlorhexidine Gluconate 4% Surgical Soap) use.          [place] - Preparing for Surgery Before surgery, you can play an important role.  Because skin is not sterile, your skin needs to be as free of germs  as possible.  You can reduce the number of germs on your skin by washing with CHG (chlorahexidine gluconate) soap before surgery.  CHG is an antiseptic cleaner which kills germs and bonds with the skin to continue killing germs even after washing. Please DO NOT use if you have an allergy to CHG or antibacterial soaps.  If your skin becomes reddened/irritated stop using the CHG and inform your nurse when you arrive at Short Stay. Do not shave (including legs and underarms) for at least 48 hours prior to the first CHG shower.  You may shave your face/neck. Please follow these instructions carefully:  1.  Shower with CHG Soap the night before surgery and the  morning of Surgery.  2.  If you choose to wash your hair, wash your hair first as usual with your  normal  shampoo.  3.  After you shampoo, rinse your hair and body thoroughly to remove the  shampoo.                           4.  Use CHG as you would any other liquid soap.  You can apply chg directly  to the skin and wash                       Gently with a scrungie or clean washcloth.  5.  Apply the CHG Soap to your body  ONLY FROM THE NECK DOWN.   Do not use on face/ open                           Wound or open sores. Avoid contact with eyes, ears mouth and genitals (private parts).                       Wash face,  Genitals (private parts) with your normal soap.             6.  Wash thoroughly, paying special attention to the area where your surgery  will be performed.  7.  Thoroughly rinse your body with warm water from the neck down.  8.  DO NOT shower/wash with your normal soap after using and rinsing off  the CHG Soap.                9.  Pat yourself dry with a clean towel.            10.  Wear clean pajamas.            11.  Place clean sheets on your bed the night of your first shower and do not  sleep with pets. Day of Surgery : Do not apply any lotions/deodorants the morning of surgery.  Please wear clean clothes to the hospital/surgery  center.  FAILURE TO FOLLOW THESE INSTRUCTIONS MAY RESULT IN THE CANCELLATION OF YOUR SURGERY PATIENT SIGNATURE_________________________________  NURSE SIGNATURE__________________________________  ________________________________________________________________________

## 2014-04-14 NOTE — Pre-Procedure Instructions (Signed)
04-14-14 EKG/ CXR 03-25-14 Epic. 03-29-14 labs CBC,BMP Epic.

## 2014-04-15 NOTE — H&P (Signed)
History of Present Illness Kimberly Larsen is 68 years old with the following urologic history:    1) Urolithiasis: She has a history of recurrent urolithiasis and has undergone treatment with SWL and ureteroscopy in the 1990s. She initially presented to me in November 2015 with bilateral ureteral and renal stones and sepsis requiring emergent stent placement and ICU admission.    Nov 2015: B stent placement    Interval history:    She follows up today after her recent hospital admission when she was admitted for bilateral ureteral obstruction due to bilateral stones, acute kidney injury, and sepsis. Her urine culture was positive for enterococcus and she was treated with ampicillin and now is completing treatment with Augmentin. Her renal dysfunction completely resolved after her stents were placed. She follows up today to discuss options for definitive management of her stone disease.     Since discharge from the hospital, she has been doing well and has remained afebrile. She does feel somewhat fatigued as expected. She has had some flank pain and urinary frequency consistent with her indwelling stents.   Past Medical History Problems  1. History of Anxiety (F41.9) 2. History of degenerative disc disease (Z87.39) 3. History of hyperlipidemia (Z86.39) 4. History of hypertension (Z86.79)  Surgical History Problems  1. History of Arthrocentesis Aspirate Ganglion Cyst Of Intermediate Joint 2. History of Back Surgery 3. History of Complete Rhinoplasty (With Major Septal Repair) 4. History of Cystoscopy With Insertion Of Ureteral Stent Bilateral 5. History of Knee Replacement 6. History of Lithotripsy 7. History of Spinal Stereotaxis Stimulation Of Cord 8. History of Tonsillectomy 9. History of Total Knee Arthroplasty 10. History of Tubal Ligation  Current Meds 1. Accuretic 20-12.5 MG Oral Tablet (Quinapril-Hydrochlorothiazide);  Therapy: (Recorded:07Dec2015) to Recorded 2.  Aspir-81 81 MG Oral Tablet Delayed Release;  Therapy: (Recorded:07Dec2015) to Recorded 3. Biotin Maximum Strength 5000 MCG Oral Capsule;  Therapy: (Recorded:07Dec2015) to Recorded 4. Citalopram Hydrobromide 40 MG Oral Tablet;  Therapy: (Recorded:07Dec2015) to Recorded 5. Fish Oil Concentrate 1000 MG Oral Capsule;  Therapy: (Recorded:07Dec2015) to Recorded 6. Gabapentin 600 MG Oral Tablet;  Therapy: (Recorded:07Dec2015) to Recorded 7. Loratadine 10 MG Oral Tablet;  Therapy: (Recorded:07Dec2015) to Recorded 8. Melatonin TABS;  Therapy: (Recorded:07Dec2015) to Recorded 9. Methocarbamol 500 MG Oral Tablet;  Therapy: (Recorded:07Dec2015) to Recorded 10. Multi-Vitamin TABS;   Therapy: (Recorded:07Dec2015) to Recorded 11. Oxycodone-Acetaminophen 10-325 MG Oral Tablet;   Therapy: (Recorded:07Dec2015) to Recorded 12. Super B Complex TABS;   Therapy: (Recorded:07Dec2015) to Recorded 13. TraZODone HCl - 50 MG Oral Tablet;   Therapy: (Recorded:07Dec2015) to Recorded 14. Vitamin C 1000 MG Oral Tablet;   Therapy: (Recorded:07Dec2015) to Recorded 15. Zinc 50 MG Oral Tablet;   Therapy: (Recorded:07Dec2015) to Recorded 16. Zofran 4 MG Oral Tablet (Ondansetron HCl);   Therapy: (Recorded:07Dec2015) to Recorded  Allergies Medication  1. No Known Drug Allergies  Family History Problems  1. No pertinent family history : Mother  Social History Problems    Denied: History of Alcohol use   Never a smoker  Review of Systems  Gastrointestinal: nausea.  Constitutional: feeling tired (fatigue).  Respiratory: shortness of breath.  Musculoskeletal: back pain and joint pain.  Neurological: dizziness.  Psychiatric: depression and anxiety.    Vitals Vital Signs [Data Includes: Last 1 Day]  Recorded: [id number] 04:18PM  Height: 5 ft 2 in Weight: 197 lb  BMI Calculated: 36.03 BSA Calculated: 1.9 Blood Pressure: 139 / 75 Heart Rate: 84  Physical Exam Constitutional: Well nourished and  well developed . No  acute distress.  ENT:. The ears and nose are normal in appearance.  Neck: The appearance of the neck is normal and no neck mass is present.  Pulmonary: No respiratory distress and normal respiratory rhythm and effort.  Cardiovascular: Heart rate and rhythm are normal . No peripheral edema.  Abdomen: The abdomen is soft and nontender. No masses are palpated. No CVA tenderness. No hernias are palpable. No hepatosplenomegaly noted.  Skin: Normal skin turgor, no visible rash and no visible skin lesions.  Neuro/Psych:. Mood and affect are appropriate.    Results/Data Urine [Data Includes: Last 1 Day]   [id number]  COLOR YELLOW   APPEARANCE TURBID   SPECIFIC GRAVITY 1.020   pH 7.0   GLUCOSE NEG mg/dL  BILIRUBIN NEG   KETONE NEG mg/dL  BLOOD LARGE   PROTEIN 100 mg/dL  UROBILINOGEN 0.2 mg/dL  NITRITE NEG   LEUKOCYTE ESTERASE MOD   SQUAMOUS EPITHELIAL/HPF NONE SEEN   WBC 21-50 WBC/hpf  RBC 11-20 RBC/hpf  BACTERIA NONE SEEN   CRYSTALS NONE SEEN   CASTS NONE SEEN    Her urine has been cultured.    I have reviewed her recent imaging studies including her CT scan and her KUB x-ray with her and her daughter today.   Assessment Assessed  1. Ureteral stone with hydronephrosis (N13.2) 2. Acute kidney injury (N17.9) 3. Urinary tract infection (N39.0)  Plan Health Maintenance  1. UA With REFLEX; [Do Not Release]; Status:Complete;   Done: [id number] 04:07PM Urolithiasis  2. Start: Ondansetron 4 MG Oral Tablet Dispersible; TAKE 4 MG Every 8 hours PRN nausea 3. Follow-up Office  Follow-up - will call to schedule surgery  Status: Complete  Done:  [id number]  Discussion/Summary 1. Bilateral renal calculi: She does have bilateral large burden renal calculi and a possible proximal left ureteral calculus. We have reviewed options for definitive management including percutaneous nephrostolithotomy, ureteroscopic laser lithotripsy, and shockwave lithotripsy. We reviewed the  pros and cons of each approach. She understands the necessity to have her complete treatment for her recent section prior to any definitive therapy for her stones. After reviewing the pros and cons of treatment options, she is most interested in proceeding with ureteroscopic laser lithotripsy. Our plan is to proceed with left ureteroscopic laser lithotripsy with plans to treat her right side as well if the procedure goes smoothly without concerns for complication or an extended anesthetic time. However, she is fully prepared that she may require a staged procedure to complete treatment. We reviewed the potential risks of treatment including but not limited to bleeding, infection, risks of anesthesia including cardiopulmonary risks, risk of injury or damage to the urinary tract including ureteral injury or stricture, loss of kidney, and need for retreatment. She does wish to proceed as planned. Her urine has been cultured today and she will plan to complete another week of Augmentin prior to definitive treatment.    Cc: Dr. Greig Right  Dr. Melina Schools     Signatures Electronically signed by : Raynelle Bring, M.D.; Apr 04 2014  5:04PM EST

## 2014-04-17 MED ORDER — LEVOFLOXACIN IN D5W 500 MG/100ML IV SOLN
500.0000 mg | INTRAVENOUS | Status: AC
  Administered 2014-04-18: 500 mg via INTRAVENOUS
  Filled 2014-04-17 (×2): qty 100

## 2014-04-18 ENCOUNTER — Ambulatory Visit (HOSPITAL_COMMUNITY): Payer: Medicare Other

## 2014-04-18 ENCOUNTER — Ambulatory Visit (HOSPITAL_COMMUNITY): Payer: Medicare Other | Admitting: Registered Nurse

## 2014-04-18 ENCOUNTER — Encounter (HOSPITAL_COMMUNITY): Payer: Self-pay | Admitting: *Deleted

## 2014-04-18 ENCOUNTER — Encounter (HOSPITAL_COMMUNITY)
Admission: RE | Disposition: A | Discharge status: Home / Self Care | Payer: Self-pay | Source: Ambulatory Visit | Attending: Urology

## 2014-04-18 ENCOUNTER — Ambulatory Visit (HOSPITAL_COMMUNITY)
Admission: RE | Admit: 2014-04-18 | Discharge: 2014-04-18 | Disposition: A | Discharge status: Home / Self Care | Payer: Medicare Other | Source: Ambulatory Visit | Attending: Urology | Admitting: Urology

## 2014-04-18 DIAGNOSIS — N209 Urinary calculus, unspecified: Secondary | ICD-10-CM | POA: Diagnosis not present

## 2014-04-18 DIAGNOSIS — I10 Essential (primary) hypertension: Secondary | ICD-10-CM | POA: Diagnosis not present

## 2014-04-18 DIAGNOSIS — Z7982 Long term (current) use of aspirin: Secondary | ICD-10-CM | POA: Insufficient documentation

## 2014-04-18 DIAGNOSIS — M797 Fibromyalgia: Secondary | ICD-10-CM | POA: Diagnosis not present

## 2014-04-18 DIAGNOSIS — N179 Acute kidney failure, unspecified: Secondary | ICD-10-CM | POA: Insufficient documentation

## 2014-04-18 DIAGNOSIS — E785 Hyperlipidemia, unspecified: Secondary | ICD-10-CM | POA: Insufficient documentation

## 2014-04-18 DIAGNOSIS — Z6834 Body mass index (BMI) 34.0-34.9, adult: Secondary | ICD-10-CM | POA: Insufficient documentation

## 2014-04-18 DIAGNOSIS — Z79899 Other long term (current) drug therapy: Secondary | ICD-10-CM | POA: Diagnosis not present

## 2014-04-18 DIAGNOSIS — G8929 Other chronic pain: Secondary | ICD-10-CM | POA: Diagnosis not present

## 2014-04-18 DIAGNOSIS — N202 Calculus of kidney with calculus of ureter: Secondary | ICD-10-CM | POA: Diagnosis not present

## 2014-04-18 DIAGNOSIS — M1990 Unspecified osteoarthritis, unspecified site: Secondary | ICD-10-CM | POA: Diagnosis not present

## 2014-04-18 DIAGNOSIS — N136 Pyonephrosis: Secondary | ICD-10-CM | POA: Insufficient documentation

## 2014-04-18 DIAGNOSIS — R05 Cough: Secondary | ICD-10-CM | POA: Diagnosis not present

## 2014-04-18 DIAGNOSIS — G709 Myoneural disorder, unspecified: Secondary | ICD-10-CM | POA: Diagnosis not present

## 2014-04-18 DIAGNOSIS — Z87442 Personal history of urinary calculi: Secondary | ICD-10-CM | POA: Insufficient documentation

## 2014-04-18 DIAGNOSIS — N201 Calculus of ureter: Secondary | ICD-10-CM | POA: Diagnosis not present

## 2014-04-18 DIAGNOSIS — N200 Calculus of kidney: Secondary | ICD-10-CM | POA: Diagnosis not present

## 2014-04-18 DIAGNOSIS — R059 Cough, unspecified: Secondary | ICD-10-CM

## 2014-04-18 HISTORY — PX: CYSTOSCOPY WITH URETEROSCOPY AND STENT PLACEMENT: SHX6377

## 2014-04-18 HISTORY — PX: HOLMIUM LASER APPLICATION: SHX5852

## 2014-04-18 SURGERY — CYSTOURETEROSCOPY, WITH STENT INSERTION
Anesthesia: General | Laterality: Bilateral

## 2014-04-18 MED ORDER — MIDAZOLAM HCL 2 MG/2ML IJ SOLN
INTRAMUSCULAR | Status: AC
  Filled 2014-04-18: qty 2

## 2014-04-18 MED ORDER — DEXAMETHASONE SODIUM PHOSPHATE 10 MG/ML IJ SOLN
INTRAMUSCULAR | Status: AC
  Filled 2014-04-18: qty 1

## 2014-04-18 MED ORDER — SODIUM CHLORIDE 0.9 % IR SOLN
Status: DC | PRN
  Administered 2014-04-18: 6000 mL

## 2014-04-18 MED ORDER — PROPOFOL 10 MG/ML IV BOLUS
INTRAVENOUS | Status: DC | PRN
  Administered 2014-04-18: 200 mg via INTRAVENOUS

## 2014-04-18 MED ORDER — PHENYLEPHRINE HCL 10 MG/ML IJ SOLN
10.0000 mg | INTRAVENOUS | Status: DC | PRN
  Administered 2014-04-18: 10 ug/min via INTRAVENOUS

## 2014-04-18 MED ORDER — FENTANYL CITRATE 0.05 MG/ML IJ SOLN
INTRAMUSCULAR | Status: DC | PRN
  Administered 2014-04-18: 50 ug via INTRAVENOUS
  Administered 2014-04-18: 100 ug via INTRAVENOUS

## 2014-04-18 MED ORDER — ACETAMINOPHEN 10 MG/ML IV SOLN
1000.0000 mg | Freq: Once | INTRAVENOUS | Status: AC
  Administered 2014-04-18: 1000 mg via INTRAVENOUS
  Filled 2014-04-18: qty 100

## 2014-04-18 MED ORDER — PHENYLEPHRINE HCL 10 MG/ML IJ SOLN
INTRAMUSCULAR | Status: DC | PRN
  Administered 2014-04-18: 40 ug via INTRAVENOUS
  Administered 2014-04-18 (×3): 120 ug via INTRAVENOUS

## 2014-04-18 MED ORDER — ALBUTEROL SULFATE (2.5 MG/3ML) 0.083% IN NEBU
2.5000 mg | INHALATION_SOLUTION | Freq: Four times a day (QID) | RESPIRATORY_TRACT | Status: DC | PRN
  Administered 2014-04-18: 2.5 mg via RESPIRATORY_TRACT

## 2014-04-18 MED ORDER — LACTATED RINGERS IV SOLN: INTRAVENOUS | Status: DC

## 2014-04-18 MED ORDER — DEXAMETHASONE SODIUM PHOSPHATE 10 MG/ML IJ SOLN
INTRAMUSCULAR | Status: DC | PRN
  Administered 2014-04-18: 10 mg via INTRAVENOUS

## 2014-04-18 MED ORDER — MIDAZOLAM HCL 5 MG/5ML IJ SOLN
INTRAMUSCULAR | Status: DC | PRN
  Administered 2014-04-18: 1 mg via INTRAVENOUS
  Administered 2014-04-18: 2 mg via INTRAVENOUS

## 2014-04-18 MED ORDER — LIDOCAINE HCL (CARDIAC) 10 MG/ML IV SOLN
INTRAVENOUS | Status: DC | PRN
  Administered 2014-04-18: 100 mg via INTRAVENOUS

## 2014-04-18 MED ORDER — HYDROCODONE-ACETAMINOPHEN 5-325 MG PO TABS
1.0000 | ORAL_TABLET | Freq: Four times a day (QID) | ORAL | Status: DC | PRN
  Administered 2014-04-18: 1 via ORAL
  Filled 2014-04-18: qty 1

## 2014-04-18 MED ORDER — IOHEXOL 300 MG/ML  SOLN
INTRAMUSCULAR | Status: DC | PRN
  Administered 2014-04-18: 12 mL via URETHRAL

## 2014-04-18 MED ORDER — HYDROCODONE-ACETAMINOPHEN 5-325 MG PO TABS: 1.0000 | ORAL_TABLET | Freq: Four times a day (QID) | ORAL | Status: DC | PRN

## 2014-04-18 MED ORDER — PROPOFOL 10 MG/ML IV BOLUS
INTRAVENOUS | Status: AC
  Filled 2014-04-18: qty 20

## 2014-04-18 MED ORDER — LACTATED RINGERS IV SOLN
INTRAVENOUS | Status: DC | PRN
  Administered 2014-04-18 (×3): via INTRAVENOUS

## 2014-04-18 MED ORDER — FENTANYL CITRATE 0.05 MG/ML IJ SOLN
INTRAMUSCULAR | Status: AC
  Filled 2014-04-18: qty 5

## 2014-04-18 MED ORDER — ONDANSETRON HCL 4 MG/2ML IJ SOLN
INTRAMUSCULAR | Status: AC
  Filled 2014-04-18: qty 2

## 2014-04-18 MED ORDER — MEPERIDINE HCL 50 MG/ML IJ SOLN: 6.2500 mg | INTRAMUSCULAR | Status: DC | PRN

## 2014-04-18 MED ORDER — LEVOFLOXACIN 500 MG PO TABS: 500.0000 mg | ORAL_TABLET | Freq: Every day | ORAL | Status: DC

## 2014-04-18 MED ORDER — ALBUTEROL SULFATE (2.5 MG/3ML) 0.083% IN NEBU
INHALATION_SOLUTION | RESPIRATORY_TRACT | Status: AC
  Filled 2014-04-18: qty 3

## 2014-04-18 MED ORDER — FENTANYL CITRATE 0.05 MG/ML IJ SOLN: 25.0000 ug | INTRAMUSCULAR | Status: DC | PRN

## 2014-04-18 MED ORDER — LIDOCAINE HCL (CARDIAC) 20 MG/ML IV SOLN
INTRAVENOUS | Status: AC
  Filled 2014-04-18: qty 5

## 2014-04-18 MED ORDER — ONDANSETRON HCL 4 MG/2ML IJ SOLN
INTRAMUSCULAR | Status: DC | PRN
  Administered 2014-04-18: 4 mg via INTRAVENOUS

## 2014-04-18 MED ORDER — PROMETHAZINE HCL 25 MG/ML IJ SOLN: 6.2500 mg | INTRAMUSCULAR | Status: DC | PRN

## 2014-04-18 SURGICAL SUPPLY — 19 items
BAG URO CATCHER STRL LF (DRAPE) ×1 IMPLANT
BASKET ZERO TIP NITINOL 2.4FR (BASKET) ×1 IMPLANT
BSKT STON RTRVL ZERO TP 2.4FR (BASKET) ×1
CATH INTERMIT  6FR 70CM (CATHETERS) ×1 IMPLANT
DRAPE CAMERA CLOSED 9X96 (DRAPES) ×1 IMPLANT
FIBER LASER FLEXIVA 1000 (UROLOGICAL SUPPLIES) ×1 IMPLANT
FIBER LASER FLEXIVA 200 (UROLOGICAL SUPPLIES) ×2 IMPLANT
FIBER LASER FLEXIVA 365 (UROLOGICAL SUPPLIES) ×1 IMPLANT
FIBER LASER FLEXIVA 550 (UROLOGICAL SUPPLIES) ×1 IMPLANT
FIBER LASER TRAC TIP (UROLOGICAL SUPPLIES) ×2 IMPLANT
GLOVE BIOGEL M STRL SZ7.5 (GLOVE) ×3 IMPLANT
GOWN STRL REUS W/ TWL XL LVL3 (GOWN DISPOSABLE) IMPLANT
GOWN STRL REUS W/TWL XL LVL3 (GOWN DISPOSABLE) ×2
GUIDEWIRE STR DUAL SENSOR (WIRE) ×1 IMPLANT
MANIFOLD NEPTUNE II (INSTRUMENTS) ×1 IMPLANT
PACK CYSTO (CUSTOM PROCEDURE TRAY) ×1 IMPLANT
SHEATH ACCESS URETERAL 38CM (SHEATH) ×1 IMPLANT
STENT CONTOUR 6FRX24X.038 (STENTS) ×2 IMPLANT
TUBING CONNECTING 10 (TUBING) ×1 IMPLANT

## 2014-04-18 NOTE — Anesthesia Postprocedure Evaluation (Signed)
  Anesthesia Post-op Note  Patient: Kimberly Larsen  Procedure(s) Performed: Procedure(s) (LRB): CYSTOSCOPY WITH URETEROSCOPY AND STENT PLACEMENT,RETROGRADE (Bilateral) HOLMIUM LASER APPLICATION (Bilateral)  Patient Location: PACU  Anesthesia Type: General  Level of Consciousness: awake and alert   Airway and Oxygen Therapy: Patient Spontanous Breathing  Post-op Pain: mild  Post-op Assessment: Post-op Vital signs reviewed, Patient's Cardiovascular Status Stable, Respiratory Function Stable, Patent Airway and No signs of Nausea or vomiting  Last Vitals:  Filed Vitals:   04/18/14 1314  BP: 136/54  Pulse: 88  Temp: 36.9 C  Resp: 16    Post-op Vital Signs: stable   Complications: No apparent anesthesia complications

## 2014-04-18 NOTE — Transfer of Care (Signed)
Immediate Anesthesia Transfer of Care Note  Patient: Kimberly Larsen  Procedure(s) Performed: Procedure(s): CYSTOSCOPY WITH URETEROSCOPY AND STENT PLACEMENT,RETROGRADE (Bilateral) HOLMIUM LASER APPLICATION (Bilateral)  Patient Location: PACU  Anesthesia Type:General  Level of Consciousness: awake, alert , oriented and patient cooperative  Airway & Oxygen Therapy: Patient Spontanous Breathing and Patient connected to face mask oxygen  Post-op Assessment: Report given to PACU RN, Post -op Vital signs reviewed and stable and Patient moving all extremities X 4  Post vital signs: stable  Complications: No apparent anesthesia complications

## 2014-04-18 NOTE — Op Note (Signed)
Preoperative diagnosis: Bilateral renal and ureteral calculi  Postoperative diagnosis: Bilateral renal and ureteral calculi  Procedure:  1. Cystoscopy 2. Bilateral ureteroscopy and stone removal 3. Ureteroscopic laser lithotripsy 4. Bilateral ureteral stent placement (6 x 24) 5. Bilateral retrograde pyelography with interpretation  Surgeon: Pryor Curia. M.D.  Anesthesia: General  Complications: None  Intraoperative findings: Bilateral retrograde pyelography was performed.  The left side demonstrated a filling defect within the renal pelvis and lower pole calyx consistent with the patient's known calculi without other abnormalities noted. The right side demonstrated a filling defect at the UPJ and in the lower pole calyx consistent with known stones and no other abnormalities.  EBL: Minimal  Specimens: 1. Left renal and ureteral calculi 2. Right renal and ureteral calculi  Disposition of specimens: [facility] for stone analysis  Indication: Kimberly Larsen  is a 68 y.o. patient with urolithiasis.  She initially presented with bilateral ureteral obstruction from her stones and sepsis and acute renal failure.   She underwent emergent ureteral stent drainage and appropriate antibiotic therapy. She now presents for definitive stone treatment.  After reviewing the management options for treatment, they elected to proceed with the above surgical procedure(s). We have discussed the potential benefits and risks of the procedure, side effects of the proposed treatment, the likelihood of the patient achieving the goals of the procedure, and any potential problems that might occur during the procedure or recuperation. Informed consent has been obtained.  Description of procedure:  The patient was taken to the operating room and general anesthesia was induced.  The patient was placed in the dorsal lithotomy position, prepped and draped in the usual sterile fashion, and  preoperative antibiotics were administered. A preoperative time-out was performed.   Cystourethroscopy was performed.  The patient's urethra was examined and was normal. The bladder was then systematically examined in its entirety. There was no evidence for any bladder tumors, stones, or other mucosal pathology.  Bilateral indwelling ureteral stents were in place.  Attention then turned to the left ureteral orifice. The patient's indwelling stent was brought out to the urethral meatus with the aid of flexible graspers.  A 0.38 sensor guidewire was in veins through the stent and up into the left renal pelvis under fluoroscopic guidance.  A 6 French ureteral catheter was then used to perform a retrograde pyelogram with omnipaque contrast.  This demonstrated a normal caliber ureter without filling defects.  There were radiopaque areas in the renal pelvis and lower pole calyx consistent with the patient's known stones. A 12/14 Fr ureteral access sheath was then advanced over the guide wire. The digital flexible ureteroscope was then advanced through the access sheath into the ureter next to the guidewire and the calculi were identified. The stones were then fragmented with a 200  holmium laser fiber on a setting of 0.8 J and a frequency of 8 Hz.  One stone fragments were small enough, a 0 tip nitinol basket was used to retrieve all stone fragments larger than 1-2 mm.  The renal collecting system was then reevaluated.  No sizable stones were identified.  Guidewire was left in place as the ureteral access sheath was easily removed and was back loaded on the cystoscope.  A 6 x 24 double-J ureteral stent was advanced over the wire using Seldinger technique and positioned appropriately under fluoroscopic and cystoscopic guidance.  The wire was removed.  Attention then turned to the contralateral side.  An identical procedure was performed on the  right side.  The patient's indwelling stent on that side was brought out  the urethral meatus and a 0.38 sensor guidewire was placed through the stent.  A new 6 French ureteral catheter was advanced over the wire into the ureter and a retrograde pyelogram was again performed with Omnipaque contrast.  Findings were as dictated above.  There were no filling defects within the ureter consistent with stones but there was a large filling defect near the ureteropelvic junction just within the renal pelvis and another within the lower pole calyx.  Again, a 12/14 ureteral access sheath was advanced over the wire.  The digital flexible ureteroscope was advanced through the access sheath and the stones were all fragmented on a setting of 0.8 J and 8 Hz again with a 200  holmium laser fiber.  Multiple fragments were then removed and remaining small fragments were left for subsequent passage.The safety wire was then replaced and the access sheath removed.  The guidewire was backloaded through the cystoscope and a ureteral stent was advance over the wire using Seldinger technique.  The stent was positioned appropriately under fluoroscopic and cystoscopic guidance.  The wire was then removed with an adequate stent curl noted in the renal pelvis as well as in the bladder.  The bladder was then emptied and the procedure ended.  The patient appeared to tolerate the procedure well and without complications.  The patient was able to be awakened and transferred to the recovery unit in satisfactory condition.   Pryor Curia MD

## 2014-04-18 NOTE — Interval H&P Note (Signed)
History and Physical Interval Note:  04/18/2014 8:51 AM  Kimberly Larsen  has presented today for surgery, with the diagnosis of BILATERAL RENAL CALCULI  The various methods of treatment have been discussed with the patient and family. After consideration of risks, benefits and other options for treatment, the patient has consented to  Procedure(s): CYSTOSCOPY WITH URETEROSCOPY AND STENT PLACEMENT (Bilateral) HOLMIUM LASER APPLICATION (Bilateral) as a surgical intervention .  The patient's history has been reviewed, patient examined, no change in status, stable for surgery.  I have reviewed the patient's chart and labs.  Questions were answered to the patient's satisfaction.     Therman Hughlett,LES

## 2014-04-18 NOTE — Discharge Instructions (Addendum)
1. You may see some blood in the urine and may have some burning with urination for 48-72 hours. You also may notice that you have to urinate more frequently or urgently after your procedure which is normal.  2. You should call should you develop an inability urinate, fever > 101, persistent nausea and vomiting that prevents you from eating or drinking to stay hydrated.  3. If you have a stent, you will likely urinate more frequently and urgently until the stent is removed and you may experience some discomfort/pain in the lower abdomen and flank especially when urinating. You may take pain medication prescribed to you if needed for pain. You may also intermittently have blood in the urine until the stent is removed.      General Anesthesia, Care After Refer to this sheet in the next few weeks. These instructions provide you with information on caring for yourself after your procedure. Your health care provider may also give you more specific instructions. Your treatment has been planned according to current medical practices, but problems sometimes occur. Call your health care provider if you have any problems or questions after your procedure. WHAT TO EXPECT AFTER THE PROCEDURE After the procedure, it is typical to experience:  Sleepiness.  Nausea and vomiting. HOME CARE INSTRUCTIONS  For the first 24 hours after general anesthesia:  Have a responsible person with you.  Do not drive a car. If you are alone, do not take public transportation.  Do not drink alcohol.  Do not take medicine that has not been prescribed by your health care provider.  Do not sign important papers or make important decisions.  You may resume a normal diet and activities as directed by your health care provider.  Change bandages (dressings) as directed.  If you have questions or problems that seem related to general anesthesia, call the hospital and ask for the anesthetist or anesthesiologist on  call. SEEK MEDICAL CARE IF:  You have nausea and vomiting that continue the day after anesthesia.  You develop a rash. SEEK IMMEDIATE MEDICAL CARE IF:   You have difficulty breathing.  You have chest pain.  You have any allergic problems. Document Released: 07/22/2000 Document Revised: 04/20/2013 Document Reviewed: 10/29/2012 [facility] Patient Information 2015 [place], [place]. This information is not intended to replace advice given to you by your health care provider. Make sure you discuss any questions you have with your health care provider.

## 2014-04-18 NOTE — Anesthesia Preprocedure Evaluation (Signed)
Anesthesia Evaluation  Patient identified by MRN, date of birth, ID band Patient awake    Reviewed: Allergy & Precautions, H&P , Patient's Chart, lab work & pertinent test results, reviewed documented beta blocker date and time   History of Anesthesia Complications Negative for: history of anesthetic complications  Airway Mallampati: III  TM Distance: >3 FB Neck ROM: full    Dental   Pulmonary  breath sounds clear to auscultation        Cardiovascular Exercise Tolerance: Good hypertension, Pt. on medications Rhythm:regular Rate:Normal     Neuro/Psych Anxiety  Neuromuscular disease    GI/Hepatic   Endo/Other    Renal/GU Renal disease     Musculoskeletal  (+) Arthritis -, Fibromyalgia -  Abdominal   Peds  Hematology   Anesthesia Other Findings Morbid obesity Chronic pain with new spinal chord stimulator  Reproductive/Obstetrics                             Anesthesia Physical  Anesthesia Plan  ASA: II  Anesthesia Plan: General   Post-op Pain Management:    Induction: Intravenous  Airway Management Planned: LMA  Additional Equipment:   Intra-op Plan:   Post-operative Plan:   Informed Consent: I have reviewed the patients History and Physical, chart, labs and discussed the procedure including the risks, benefits and alternatives for the proposed anesthesia with the patient or authorized representative who has indicated his/her understanding and acceptance.   Dental Advisory Given  Plan Discussed with: CRNA and Surgeon  Anesthesia Plan Comments:         Anesthesia Quick Evaluation

## 2014-04-19 ENCOUNTER — Encounter (HOSPITAL_COMMUNITY): Payer: Self-pay | Admitting: Urology

## 2014-04-24 DIAGNOSIS — J0100 Acute maxillary sinusitis, unspecified: Secondary | ICD-10-CM | POA: Diagnosis not present

## 2014-04-26 ENCOUNTER — Encounter (HOSPITAL_COMMUNITY): Payer: Self-pay | Admitting: Urology

## 2014-05-02 DIAGNOSIS — I10 Essential (primary) hypertension: Secondary | ICD-10-CM | POA: Diagnosis not present

## 2014-05-02 DIAGNOSIS — M545 Low back pain: Secondary | ICD-10-CM | POA: Diagnosis not present

## 2014-05-02 DIAGNOSIS — N201 Calculus of ureter: Secondary | ICD-10-CM | POA: Diagnosis not present

## 2014-05-03 DIAGNOSIS — N132 Hydronephrosis with renal and ureteral calculous obstruction: Secondary | ICD-10-CM | POA: Diagnosis not present

## 2014-05-03 DIAGNOSIS — N209 Urinary calculus, unspecified: Secondary | ICD-10-CM | POA: Diagnosis not present

## 2014-05-04 ENCOUNTER — Other Ambulatory Visit: Payer: Self-pay | Admitting: Urology

## 2014-05-06 DIAGNOSIS — Z23 Encounter for immunization: Secondary | ICD-10-CM | POA: Diagnosis not present

## 2014-05-17 ENCOUNTER — Other Ambulatory Visit: Payer: Self-pay | Admitting: Urology

## 2014-05-17 DIAGNOSIS — N209 Urinary calculus, unspecified: Secondary | ICD-10-CM | POA: Diagnosis not present

## 2014-05-19 NOTE — Patient Instructions (Signed)
Kimberly Larsen  05/19/2014   Your procedure is scheduled on: 05/30/2014    Report to [facility] Main  Entrance and follow signs to               [place] at       1245pm  Call this number if you have problems the morning of surgery [phone]   Remember:  Do not eat food after midnite.  May have clear liquids until 0800am then nothing by mouth.  .     Take these medicines the morning of surgery with A SIP OF WATER:                                You may not have any metal on your body including hair pins and              piercings  Do not wear jewelry, make-up, lotions, powders or perfumes.             Do not wear nail polish.  Do not shave  48 hours prior to surgery.     Do not bring valuables to the hospital. [place].  Contacts, dentures or bridgework may not be worn into surgery.      Patients discharged the day of surgery will not be allowed to drive home.  Name and phone number of your driver:  Special Instructions: coughing and deep breathing exercises, leg exercises    CLEAR LIQUID DIET   Foods Allowed                                                                     Foods Excluded  Coffee and tea, regular and decaf                             liquids that you cannot  Plain Jell-O in any flavor                                             see through such as: Fruit ices (not with fruit pulp)                                     milk, soups, orange juice  Iced Popsicles                                    All solid food Carbonated beverages, regular and diet                                    Cranberry, grape and apple juices Sports drinks like Gatorade  Lightly seasoned clear broth or consume(fat free) Sugar, honey syrup  Sample Menu Breakfast                                Lunch                                     Supper Cranberry juice                    Beef broth                             Chicken broth Jell-O                                     Grape juice                           Apple juice Coffee or tea                        Jell-O                                      Popsicle                                                Coffee or tea                        Coffee or tea  _____________________________________________________________________                Please read over the following fact sheets you were given: _____________________________________________________________________             [facility] - Preparing for Surgery Before surgery, you can play an important role.  Because skin is not sterile, your skin needs to be as free of germs as possible.  You can reduce the number of germs on your skin by washing with CHG (chlorahexidine gluconate) soap before surgery.  CHG is an antiseptic cleaner which kills germs and bonds with the skin to continue killing germs even after washing. Please DO NOT use if you have an allergy to CHG or antibacterial soaps.  If your skin becomes reddened/irritated stop using the CHG and inform your nurse when you arrive at Short Stay. Do not shave (including legs and underarms) for at least 48 hours prior to the first CHG shower.  You may shave your face/neck. Please follow these instructions carefully:  1.  Shower with CHG Soap the night before surgery and the  morning of Surgery.  2.  If you choose to wash your hair, wash your hair first as usual with your  normal  shampoo.  3.  After you shampoo, rinse your hair and body thoroughly to remove the  shampoo.  4.  Use CHG as you would any other liquid soap.  You can apply chg directly  to the skin and wash                       Gently with a scrungie or clean washcloth.  5.  Apply the CHG Soap to your body ONLY FROM THE NECK DOWN.   Do not use on face/ open                           Wound or open sores. Avoid contact with eyes, ears mouth and genitals  (private parts).                       Wash face,  Genitals (private parts) with your normal soap.             6.  Wash thoroughly, paying special attention to the area where your surgery  will be performed.  7.  Thoroughly rinse your body with warm water from the neck down.  8.  DO NOT shower/wash with your normal soap after using and rinsing off  the CHG Soap.                9.  Pat yourself dry with a clean towel.            10.  Wear clean pajamas.            11.  Place clean sheets on your bed the night of your first shower and do not  sleep with pets. Day of Surgery : Do not apply any lotions/deodorants the morning of surgery.  Please wear clean clothes to the hospital/surgery center.  FAILURE TO FOLLOW THESE INSTRUCTIONS MAY RESULT IN THE CANCELLATION OF YOUR SURGERY PATIENT SIGNATURE_________________________________  NURSE SIGNATURE__________________________________  ________________________________________________________________________

## 2014-05-23 ENCOUNTER — Inpatient Hospital Stay (HOSPITAL_COMMUNITY)
Admission: RE | Admit: 2014-05-23 | Discharge: 2014-05-23 | Disposition: A | Discharge status: Home / Self Care | Payer: Medicare Other | Source: Ambulatory Visit

## 2014-05-26 ENCOUNTER — Other Ambulatory Visit (HOSPITAL_COMMUNITY): Payer: Self-pay | Admitting: *Deleted

## 2014-05-26 NOTE — Patient Instructions (Addendum)
Kimberly Larsen  05/26/2014   Your procedure is scheduled on: 05/30/14   Report to [facility] Main  Entrance and follow signs to               [place] at 12:45  PM.   Call this number if you have problems the morning of surgery [phone]   Remember:  Do not eat food  :After Midnight.            MAY HAVE CLEAR LIQUIDS UNTIL 8:45 AM             CLEAR LIQUID DIET   Foods Allowed                                                                     Foods Excluded  Coffee and tea, regular and decaf                             liquids that you cannot  Plain Jell-O in any flavor                                             see through such as: Fruit ices (not with fruit pulp)                                     milk, soups, orange juice  Iced Popsicles                                    All solid food Carbonated beverages, regular and diet                                    Cranberry, grape and apple juices Sports drinks like Gatorade Lightly seasoned clear broth or consume(fat free) Sugar, honey syrup  _____________________________________________________________________    Take these medicines the morning of surgery with A SIP OF WATER: AMLODIPINE / DULOXETINE / GABAPENTIN / NASAL SPRAY / MAY TAKE OXYCODONE OR TRAMADOL IF NEEDED                               You may not have any metal on your body including hair pins and              piercings  Do not wear jewelry, make-up, lotions, powders or perfumes.             Do not wear nail polish.  Do not shave  48 hours prior to surgery.              Men may shave face and neck.   Do not bring valuables to the hospital. [facility] IS NOT  RESPONSIBLE   FOR VALUABLES.  Contacts, dentures or bridgework may not be worn into surgery.  Leave suitcase in the car. After surgery it may be brought to your room.     Patients discharged the day of surgery will not be allowed to drive home.  Name and  phone number of your driver:  Special Instructions: N/A              Please read over the following fact sheets you were given: _____________________________________________________________________                                                     [place]  Before surgery, you can play an important role.  Because skin is not sterile, your skin needs to be as free of germs as possible.  You can reduce the number of germs on your skin by washing with CHG (chlorahexidine gluconate) soap before surgery.  CHG is an antiseptic cleaner which kills germs and bonds with the skin to continue killing germs even after washing. Please DO NOT use if you have an allergy to CHG or antibacterial soaps.  If your skin becomes reddened/irritated stop using the CHG and inform your nurse when you arrive at Short Stay. Do not shave (including legs and underarms) for at least 48 hours prior to the first CHG shower.  You may shave your face. Please follow these instructions carefully:   1.  Shower with CHG Soap the night before surgery and the  morning of Surgery.   2.  If you choose to wash your hair, wash your hair first as usual with your  normal  Shampoo.   3.  After you shampoo, rinse your hair and body thoroughly to remove the  shampoo.                                         4.  Use CHG as you would any other liquid soap.  You can apply chg directly  to the skin and wash . Gently wash with scrungie or clean wascloth    5.  Apply the CHG Soap to your body ONLY FROM THE NECK DOWN.   Do not use on open                           Wound or open sores. Avoid contact with eyes, ears mouth and genitals (private parts).                        Genitals (private parts) with your normal soap.              6.  Wash thoroughly, paying special attention to the area where your surgery  will be performed.   7.  Thoroughly rinse your body with warm water from the neck down.   8.  DO NOT  shower/wash with your normal soap after using and rinsing off  the CHG Soap .                9.  Pat yourself dry with a clean towel.  10.  Wear clean pajamas.             11.  Place clean sheets on your bed the night of your first shower and do not  sleep with pets.  Day of Surgery : Do not apply any lotions/deodorants the morning of surgery.  Please wear clean clothes to the hospital/surgery center.  FAILURE TO FOLLOW THESE INSTRUCTIONS MAY RESULT IN THE CANCELLATION OF YOUR SURGERY    PATIENT SIGNATURE_________________________________  ______________________________________________________________________

## 2014-05-27 ENCOUNTER — Encounter (HOSPITAL_COMMUNITY)
Admission: RE | Admit: 2014-05-27 | Discharge: 2014-05-27 | Disposition: A | Discharge status: Home / Self Care | Payer: Medicare Other | Source: Ambulatory Visit | Attending: Urology | Admitting: Urology

## 2014-05-27 ENCOUNTER — Encounter (HOSPITAL_COMMUNITY): Payer: Self-pay

## 2014-05-27 DIAGNOSIS — M797 Fibromyalgia: Secondary | ICD-10-CM | POA: Diagnosis not present

## 2014-05-27 DIAGNOSIS — N189 Chronic kidney disease, unspecified: Secondary | ICD-10-CM | POA: Diagnosis not present

## 2014-05-27 DIAGNOSIS — N132 Hydronephrosis with renal and ureteral calculous obstruction: Secondary | ICD-10-CM | POA: Diagnosis not present

## 2014-05-27 DIAGNOSIS — E785 Hyperlipidemia, unspecified: Secondary | ICD-10-CM | POA: Diagnosis not present

## 2014-05-27 DIAGNOSIS — Z79899 Other long term (current) drug therapy: Secondary | ICD-10-CM | POA: Diagnosis not present

## 2014-05-27 DIAGNOSIS — I129 Hypertensive chronic kidney disease with stage 1 through stage 4 chronic kidney disease, or unspecified chronic kidney disease: Secondary | ICD-10-CM | POA: Diagnosis not present

## 2014-05-27 DIAGNOSIS — M1990 Unspecified osteoarthritis, unspecified site: Secondary | ICD-10-CM | POA: Diagnosis not present

## 2014-05-27 DIAGNOSIS — Z7982 Long term (current) use of aspirin: Secondary | ICD-10-CM | POA: Diagnosis not present

## 2014-05-27 DIAGNOSIS — N200 Calculus of kidney: Secondary | ICD-10-CM | POA: Diagnosis present

## 2014-05-27 HISTORY — DX: Personal history of other infectious and parasitic diseases: Z86.19

## 2014-05-27 HISTORY — DX: Other intervertebral disc degeneration, lumbar region: M51.36

## 2014-05-27 HISTORY — DX: Dorsalgia, unspecified: M54.9

## 2014-05-27 HISTORY — DX: Other intervertebral disc degeneration, lumbar region without mention of lumbar back pain or lower extremity pain: M51.369

## 2014-05-27 LAB — CBC
HCT: 38.3 % (ref 36.0–46.0)
Hemoglobin: 12 g/dL (ref 12.0–15.0)
MCH: 28.3 pg (ref 26.0–34.0)
MCHC: 31.3 g/dL (ref 30.0–36.0)
MCV: 90.3 fL (ref 78.0–100.0)
Platelets: 267 10*3/uL (ref 150–400)
RBC: 4.24 MIL/uL (ref 3.87–5.11)
RDW: 16.3 % — ABNORMAL HIGH (ref 11.5–15.5)
WBC: 10 10*3/uL (ref 4.0–10.5)

## 2014-05-27 LAB — BASIC METABOLIC PANEL
Anion gap: 5 (ref 5–15)
BUN: 23 mg/dL (ref 6–23)
CO2: 31 mmol/L (ref 19–32)
Calcium: 9.4 mg/dL (ref 8.4–10.5)
Chloride: 106 mmol/L (ref 96–112)
Creatinine, Ser: 0.9 mg/dL (ref 0.50–1.10)
GFR calc Af Amer: 76 mL/min — ABNORMAL LOW (ref >90)
GFR calc non Af Amer: 65 mL/min — ABNORMAL LOW (ref >90)
Glucose, Bld: 112 mg/dL — ABNORMAL HIGH (ref 70–99)
Potassium: 3.6 mmol/L (ref 3.5–5.1)
Sodium: 142 mmol/L (ref 135–145)

## 2014-05-28 NOTE — H&P (Signed)
History of Present Illness Ms. Vaquera is 69 years old with the following urologic history:    1) Urolithiasis: She has a history of recurrent urolithiasis and has undergone treatment with SWL and ureteroscopy in the 1990s. She initially presented to me in November 2015 with bilateral ureteral and renal stones and sepsis requiring emergent stent placement and ICU admission.    Nov 2015: B stent placement  Dec 2015: B ureteroscopic laser lithotripsy    Interval history:    She follows up today after her left ureteral stent was removed. She is scheduled to have a left renal ultrasound today to ensure she has no residual hydronephrosis. She has denied any significant left-sided flank pain and has been managing her indwelling right ureteral stent quite well. She was noted to have yeast on her recent urine culture was treated with fluconazole and is scheduled to have a urine culture today in anticipation of her upcoming right ureteroscopic procedure.   Past Medical History Problems  1. History of Anxiety (F41.9) 2. History of degenerative disc disease (Z87.39) 3. History of hyperlipidemia (Z86.39) 4. History of hypertension (Z86.79)  Surgical History Problems  1. History of Arthrocentesis Aspirate Ganglion Cyst Of Intermediate Joint 2. History of Back Surgery 3. History of Complete Rhinoplasty (With Major Septal Repair) 4. History of Cystoscopy With Insertion Of Ureteral Stent Bilateral 5. History of Cystoscopy With Insertion Of Ureteral Stent Bilateral 6. History of Cystoscopy With Ureteroscopy With Lithotripsy 7. History of Knee Replacement 8. History of Lithotripsy 9. History of Spinal Stereotaxis Stimulation Of Cord 10. History of Tonsillectomy 11. History of Total Knee Arthroplasty 12. History of Tubal Ligation  Current Meds 1. Accuretic 20-12.5 MG Oral Tablet;  Therapy: (Recorded:07Dec2015) to Recorded 2. Aspir-81 81 MG Oral Tablet Delayed Release;  Therapy:  (Recorded:07Dec2015) to Recorded 3. Biotin Maximum Strength 5000 MCG Oral Capsule;  Therapy: (Recorded:07Dec2015) to Recorded 4. Citalopram Hydrobromide 40 MG Oral Tablet;  Therapy: (Recorded:07Dec2015) to Recorded 5. Fish Oil Concentrate 1000 MG Oral Capsule;  Therapy: (Recorded:07Dec2015) to Recorded 6. Fluconazole 100 MG Oral Tablet; Take 1 tablet daily;  Therapy: [id number] to (Complete:13Jan2016)  Requested for: [id number]; Last  Rx:06Jan2016 Ordered 7. Gabapentin 600 MG Oral Tablet;  Therapy: (Recorded:07Dec2015) to Recorded 8. Loratadine 10 MG Oral Tablet;  Therapy: (Recorded:07Dec2015) to Recorded 9. Melatonin TABS;  Therapy: (Recorded:07Dec2015) to Recorded 10. Methocarbamol 500 MG Oral Tablet;   Therapy: (Recorded:07Dec2015) to Recorded 11. Multi-Vitamin TABS;   Therapy: (Recorded:07Dec2015) to Recorded 12. Super B Complex TABS;   Therapy: (Recorded:07Dec2015) to Recorded 13. TraZODone HCl - 50 MG Oral Tablet;   Therapy: (Recorded:07Dec2015) to Recorded 14. Vitamin C 1000 MG Oral Tablet;   Therapy: (Recorded:07Dec2015) to Recorded 15. Zinc 50 MG Oral Tablet;   Therapy: (Recorded:07Dec2015) to Recorded  Allergies Medication  1. No Known Drug Allergies  Family History Problems  1. No pertinent family history : Mother  Social History Problems  1. Denied: History of Alcohol use 2. Never a smoker  Vitals Vital Signs [Data Includes: Last 1 Day]  Recorded: 11MYT1173 11:02AM  Blood Pressure: 119 / 69 Heart Rate: 77  Physical Exam Constitutional: Well nourished and well developed . No acute distress.  ENT:. The ears and nose are normal in appearance.  Neck: The appearance of the neck is normal and no neck mass is present.  Pulmonary: No respiratory distress, normal respiratory rhythm and effort and clear bilateral breath sounds.  Cardiovascular: Heart rate and rhythm are normal . No peripheral edema.  Abdomen: No CVA tenderness.  Neuro/Psych:. Mood and affect are  appropriate.    Results/Data Urine [Data Includes: Last 1 Day]   [id number]  COLOR YELLOW   APPEARANCE CLOUDY   SPECIFIC GRAVITY 1.020   pH 6.0   GLUCOSE NEG mg/dL  BILIRUBIN NEG   KETONE NEG mg/dL  BLOOD LARGE   PROTEIN TRACE mg/dL  UROBILINOGEN 0.2 mg/dL  NITRITE NEG   LEUKOCYTE ESTERASE MOD   SQUAMOUS EPITHELIAL/HPF FEW   WBC 7-10 WBC/hpf  RBC 11-20 RBC/hpf  BACTERIA NONE SEEN   CRYSTALS NONE SEEN   CASTS NONE SEEN    Urine has been cultured    I independently reviewed her left renal ultrasound. Left kidney measures 12.1 x 5.7 x 5.5 cm. There is moderate hydronephrosis. She does have multiple cystic arias in the left kidney she has 3 lesions that appear consistent with simple cysts measuring 2.1 cm in the interpolar region, 2.7 cm in the upper pole, and 1.5 cm in the lower pole. There is a smaller 1.2 cm in the lower pole and more centrally does appear to demonstrate a simple septation although does not appear concerning for a solid mass. There is evidence of an echogenic focus in the lower pole which may represent a residual small stone fragment. The bladder demonstrates her indwelling stent located appropriately. Bladder PVRs 12 cc.   Assessment Assessed  1. Urolithiasis (N20.9)  Plan Health Maintenance  1. UA With REFLEX; [Do Not Release]; Status:Complete;   Done: 78LFY1017 10:11AM Urolithiasis  2. Start: Hydrocodone-Acetaminophen 5-325 MG Oral Tablet; TAKE 1 TO 2 TABLETS EVERY  4 TO 6 HOURS AS NEEDED FOR PAIN.  NO MORE THAN 8 TABLETS  PER DAY 3. Renew: Sulfamethoxazole-Trimethoprim 800-160 MG Oral Tablet (Bactrim DS); TAKE 1  TABLET Every twelve hours 4. Follow-up Keep Future Appt Office  Follow-up  Status: Complete  Done: [id number] 7. BASIC METABOLIC PANEL; Status:In Progress - Specimen/Data Collected;   Done:  [id number] 6. URINE CULTURE; Status:In Progress - Specimen/Data Collected;   Done: 44RXV4008 7. VENIPUNCTURE; Status:Complete;   Done:  67YPP5093  Discussion/Summary 1. Right renal calculi: She is scheduled to undergo right ureteroscopic laser lithotripsy with removal and debulking of her large right renal calculi. We have discussed the potential risks, complications, and expected recovery process. She gives her informed consent to proceed.    2. Left hydronephrosis: She currently has asymptomatic left hydronephrosis. This may be residual dilation related to her recent left ureteroscopic procedure. I will plan to perform a left retrograde pyelogram at the time of her upcoming endoscopic procedure. She understands that if there are any abnormalities, this may require left-sided ureteroscopy for further evaluation. She is prepared for this if necessary.    3. Urolithiasis: She will proceed with a full metabolic evaluation following definitive surgical treatment of her current stone disease.    Cc: Dr. Greig Right     Verified Results URINE CULTURE2 [id number] 11:33AM2 Anselm Pancoast  SOURCE : CLEAN CATCH SPECIMEN TYPE: URINE   Test Name Result Flag Reference  CULTURE, URINE2 Culture, Urine2    ===== COLONY COUNT: =====  NO GROWTH   FINAL REPORT: NO GROWTH   BASIC METABOLIC PANEL1 [id number] 11:25AM1 Read Drivers  SPECIMEN TYPE: BLOOD  [May 17, 2014 9:49PM Nahuel Wilbert] Please notify patient that her kidney function was normal when checked on her labs today.   Test Name Result Flag Reference  GLUCOSE1 95 mg/dL1  70-991  BUN1 28 mg/dL1 H1 6-231  CREATININE1 1.02 mg/dL1  0.50-1.[place] mEq/L1  [phone]  POTASSIUM1 4.3 mEq/L1  3.5-5.31  CHLORIDE1 101 mEq/L1  96-1121  CO21 30 mEq/L1  19-321  CALCIUM1 10.0 mg/dL1  8.4-10.51  Est GFR, African American1 66 mL/min1    Est GFR, NonAfrican American1 57 mL/min1 L1   THE ESTIMATED GFR IS A CALCULATION VALID FOR ADULTS (>=12 YEARS OLD) THAT USES THE CKD-EPI ALGORITHM TO ADJUST FOR AGE AND SEX. IT IS   NOT TO BE USED FOR CHILDREN, PREGNANT WOMEN,  HOSPITALIZED PATIENTS,    PATIENTS ON DIALYSIS, OR WITH RAPIDLY CHANGING KIDNEY FUNCTION. ACCORDING TO THE NKDEP, EGFR >89 IS NORMAL, 60-89 SHOWS MILD IMPAIRMENT, 30-59 SHOWS MODERATE IMPAIRMENT, 15-29 SHOWS SEVERE IMPAIRMENT AND <15 IS ESRD.     1. Amended By: Raynelle Bring; May 17 2014 9:49 PM EST  2. Amended By: Raynelle Bring; May 18 2014 4:09 PM EST  Signatures Electronically signed by : Raynelle Bring, M.D.; May 18 2014  4:09PM EST

## 2014-05-30 ENCOUNTER — Encounter (HOSPITAL_COMMUNITY)
Admission: RE | Disposition: A | Discharge status: Home / Self Care | Payer: Self-pay | Source: Ambulatory Visit | Attending: Urology

## 2014-05-30 ENCOUNTER — Ambulatory Visit (HOSPITAL_COMMUNITY)
Admission: RE | Admit: 2014-05-30 | Discharge: 2014-05-30 | Disposition: A | Discharge status: Home / Self Care | Payer: Medicare Other | Source: Ambulatory Visit | Attending: Urology | Admitting: Urology

## 2014-05-30 ENCOUNTER — Ambulatory Visit (HOSPITAL_COMMUNITY): Payer: Medicare Other | Admitting: Anesthesiology

## 2014-05-30 ENCOUNTER — Encounter (HOSPITAL_COMMUNITY): Payer: Self-pay | Admitting: *Deleted

## 2014-05-30 DIAGNOSIS — Z79899 Other long term (current) drug therapy: Secondary | ICD-10-CM | POA: Insufficient documentation

## 2014-05-30 DIAGNOSIS — M549 Dorsalgia, unspecified: Secondary | ICD-10-CM | POA: Diagnosis not present

## 2014-05-30 DIAGNOSIS — E785 Hyperlipidemia, unspecified: Secondary | ICD-10-CM | POA: Diagnosis not present

## 2014-05-30 DIAGNOSIS — N132 Hydronephrosis with renal and ureteral calculous obstruction: Secondary | ICD-10-CM | POA: Diagnosis not present

## 2014-05-30 DIAGNOSIS — M1990 Unspecified osteoarthritis, unspecified site: Secondary | ICD-10-CM | POA: Insufficient documentation

## 2014-05-30 DIAGNOSIS — N189 Chronic kidney disease, unspecified: Secondary | ICD-10-CM | POA: Diagnosis not present

## 2014-05-30 DIAGNOSIS — Z7982 Long term (current) use of aspirin: Secondary | ICD-10-CM | POA: Diagnosis not present

## 2014-05-30 DIAGNOSIS — N200 Calculus of kidney: Secondary | ICD-10-CM | POA: Diagnosis not present

## 2014-05-30 DIAGNOSIS — I129 Hypertensive chronic kidney disease with stage 1 through stage 4 chronic kidney disease, or unspecified chronic kidney disease: Secondary | ICD-10-CM | POA: Insufficient documentation

## 2014-05-30 DIAGNOSIS — M797 Fibromyalgia: Secondary | ICD-10-CM | POA: Insufficient documentation

## 2014-05-30 HISTORY — PX: CYSTOSCOPY WITH URETEROSCOPY AND STENT PLACEMENT: SHX6377

## 2014-05-30 HISTORY — PX: CYSTOSCOPY/RETROGRADE/URETEROSCOPY: SHX5316

## 2014-05-30 SURGERY — CYSTOURETEROSCOPY, WITH STENT INSERTION
Anesthesia: General | Laterality: Right

## 2014-05-30 MED ORDER — DEXAMETHASONE SODIUM PHOSPHATE 10 MG/ML IJ SOLN
INTRAMUSCULAR | Status: DC | PRN
  Administered 2014-05-30: 10 mg via INTRAVENOUS

## 2014-05-30 MED ORDER — PROPOFOL 10 MG/ML IV BOLUS
INTRAVENOUS | Status: DC | PRN
  Administered 2014-05-30: 200 mg via INTRAVENOUS

## 2014-05-30 MED ORDER — CIPROFLOXACIN IN D5W 400 MG/200ML IV SOLN
400.0000 mg | INTRAVENOUS | Status: AC
  Administered 2014-05-30: 400 mg via INTRAVENOUS

## 2014-05-30 MED ORDER — SODIUM CHLORIDE 0.9 % IV SOLN: INTRAVENOUS | Status: DC

## 2014-05-30 MED ORDER — PROPOFOL 10 MG/ML IV BOLUS
INTRAVENOUS | Status: AC
  Filled 2014-05-30: qty 20

## 2014-05-30 MED ORDER — FENTANYL CITRATE 0.05 MG/ML IJ SOLN
INTRAMUSCULAR | Status: DC | PRN
  Administered 2014-05-30: 100 ug via INTRAVENOUS

## 2014-05-30 MED ORDER — SODIUM CHLORIDE 0.9 % IR SOLN
Status: DC | PRN
  Administered 2014-05-30: 4000 mL

## 2014-05-30 MED ORDER — METOCLOPRAMIDE HCL 5 MG/ML IJ SOLN
INTRAMUSCULAR | Status: DC | PRN
  Administered 2014-05-30: 10 mg via INTRAVENOUS

## 2014-05-30 MED ORDER — MIDAZOLAM HCL 5 MG/5ML IJ SOLN
INTRAMUSCULAR | Status: DC | PRN
  Administered 2014-05-30: 2 mg via INTRAVENOUS

## 2014-05-30 MED ORDER — PROMETHAZINE HCL 25 MG/ML IJ SOLN: 6.2500 mg | INTRAMUSCULAR | Status: DC | PRN

## 2014-05-30 MED ORDER — IOHEXOL 300 MG/ML  SOLN
INTRAMUSCULAR | Status: DC | PRN
  Administered 2014-05-30: 10 mL

## 2014-05-30 MED ORDER — FENTANYL CITRATE 0.05 MG/ML IJ SOLN
INTRAMUSCULAR | Status: AC
  Filled 2014-05-30: qty 2

## 2014-05-30 MED ORDER — CIPROFLOXACIN IN D5W 400 MG/200ML IV SOLN
INTRAVENOUS | Status: AC
  Filled 2014-05-30: qty 200

## 2014-05-30 MED ORDER — ONDANSETRON HCL 4 MG/2ML IJ SOLN
INTRAMUSCULAR | Status: DC | PRN
Start: 2014-05-30 — End: 2014-05-30
  Administered 2014-05-30: 4 mg via INTRAVENOUS

## 2014-05-30 MED ORDER — LIDOCAINE HCL (CARDIAC) 20 MG/ML IV SOLN
INTRAVENOUS | Status: DC | PRN
  Administered 2014-05-30: 50 mg via INTRAVENOUS

## 2014-05-30 MED ORDER — LACTATED RINGERS IV SOLN
INTRAVENOUS | Status: DC | PRN
  Administered 2014-05-30: 14:00:00 via INTRAVENOUS

## 2014-05-30 MED ORDER — HYDROMORPHONE HCL 1 MG/ML IJ SOLN: 0.2500 mg | INTRAMUSCULAR | Status: DC | PRN

## 2014-05-30 MED ORDER — 0.9 % SODIUM CHLORIDE (POUR BTL) OPTIME
TOPICAL | Status: DC | PRN
  Administered 2014-05-30: 1000 mL

## 2014-05-30 MED ORDER — MIDAZOLAM HCL 2 MG/2ML IJ SOLN
INTRAMUSCULAR | Status: AC
  Filled 2014-05-30: qty 2

## 2014-05-30 MED ORDER — SODIUM CHLORIDE 0.9 % IV SOLN
INTRAVENOUS | Status: DC | PRN
  Administered 2014-05-30: 15:00:00 via INTRAVENOUS

## 2014-05-30 MED ORDER — LIDOCAINE HCL (CARDIAC) 20 MG/ML IV SOLN
INTRAVENOUS | Status: AC
  Filled 2014-05-30: qty 5

## 2014-05-30 SURGICAL SUPPLY — 19 items
BAG URO CATCHER STRL LF (DRAPE) ×3 IMPLANT
BASKET ZERO TIP NITINOL 2.4FR (BASKET) ×1 IMPLANT
BSKT STON RTRVL ZERO TP 2.4FR (BASKET) ×2
CATH INTERMIT  6FR 70CM (CATHETERS) ×1 IMPLANT
FIBER LASER FLEXIVA 1000 (UROLOGICAL SUPPLIES) IMPLANT
FIBER LASER FLEXIVA 200 (UROLOGICAL SUPPLIES) IMPLANT
FIBER LASER FLEXIVA 365 (UROLOGICAL SUPPLIES) IMPLANT
FIBER LASER FLEXIVA 550 (UROLOGICAL SUPPLIES) IMPLANT
FIBER LASER TRAC TIP (UROLOGICAL SUPPLIES) IMPLANT
GLOVE BIOGEL M STRL SZ7.5 (GLOVE) ×3 IMPLANT
GOWN STRL REUS W/TWL LRG LVL3 (GOWN DISPOSABLE) ×6 IMPLANT
GUIDEWIRE ANG ZIPWIRE 038X150 (WIRE) IMPLANT
GUIDEWIRE STR DUAL SENSOR (WIRE) ×3 IMPLANT
MANIFOLD NEPTUNE II (INSTRUMENTS) ×3 IMPLANT
PACK CYSTO (CUSTOM PROCEDURE TRAY) ×3 IMPLANT
SHEATH ACCESS URETERAL 38CM (SHEATH) ×1 IMPLANT
SHIELD EYE BINOCULAR (MISCELLANEOUS) IMPLANT
STENT CONTOUR 6FRX24X.038 (STENTS) ×1 IMPLANT
TUBING CONNECTING 10 (TUBING) ×3 IMPLANT

## 2014-05-30 NOTE — Interval H&P Note (Signed)
History and Physical Interval Note:  05/30/2014 1:16 PM  Kimberly Larsen  has presented today for surgery, with the diagnosis of RIGHT RENAL CALCULI  The various methods of treatment have been discussed with the patient and family. After consideration of risks, benefits and other options for treatment, the patient has consented to  Procedure(s): CYSTOSCOPY WITH URETEROSCOPY AND STENT PLACEMENT (Right) HOLMIUM LASER APPLICATION (Right) LEFT RETROGRADE/ POSSIBLE URETEROSCOPY (Left) as a surgical intervention .  The patient's history has been reviewed, patient examined, no change in status, stable for surgery.  I have reviewed the patient's chart and labs.  Questions were answered to the patient's satisfaction.     Rilen Shukla,LES

## 2014-05-30 NOTE — Transfer of Care (Signed)
Immediate Anesthesia Transfer of Care Note  Patient: STEPHAIE Larsen  Procedure(s) Performed: Procedure(s): CYSTOSCOPY WITH URETEROSCOPY AND STENT PLACEMENT (Right) LEFT RETROGRADE (Left)  Patient Location: PACU  Anesthesia Type:General  Level of Consciousness: awake, alert  and oriented  Airway & Oxygen Therapy: Patient Spontanous Breathing and Patient connected to face mask oxygen  Post-op Assessment: Report given to RN and Post -op Vital signs reviewed and stable  Post vital signs: Reviewed and stable  Last Vitals:  Filed Vitals:   05/30/14 1249  BP: 142/53  Pulse: 67  Temp: 36.8 C  Resp: 18    Complications: No apparent anesthesia complications

## 2014-05-30 NOTE — Discharge Instructions (Signed)
1. You may see some blood in the urine and may have some burning with urination for 48-72 hours. You also may notice that you have to urinate more frequently or urgently after your procedure which is normal.  2. You should call should you develop an inability urinate, fever > 101, persistent nausea and vomiting that prevents you from eating or drinking to stay hydrated.  3. If you have a stent, you will likely urinate more frequently and urgently until the stent is removed and you may experience some discomfort/pain in the lower abdomen and flank especially when urinating. You may take pain medication prescribed to you if needed for pain. You may also intermittently have blood in the urine until the stent is removed. 4.   You may remove your stent on Friday morning.  You can gently pull the string that is coming out the vagina and the stent will easily come out with the string.  You may wish to do this in a warm shower as some urine may pass when the stent is removed. If you experience any pain after the stent is out, you should take your pain medication and it should settle down.        General Anesthesia, Care After Refer to this sheet in the next few weeks. These instructions provide you with information on caring for yourself after your procedure. Your health care provider may also give you more specific instructions. Your treatment has been planned according to current medical practices, but problems sometimes occur. Call your health care provider if you have any problems or questions after your procedure. WHAT TO EXPECT AFTER THE PROCEDURE After the procedure, it is typical to experience:  Sleepiness.  Nausea and vomiting. HOME CARE INSTRUCTIONS  For the first 24 hours after general anesthesia:  Have a responsible person with you.  Do not drive a car. If you are alone, do not take public transportation.  Do not drink alcohol.  Do not take medicine that has not been prescribed by your  health care provider.  Do not sign important papers or make important decisions.  You may resume a normal diet and activities as directed by your health care provider.  Change bandages (dressings) as directed.  If you have questions or problems that seem related to general anesthesia, call the hospital and ask for the anesthetist or anesthesiologist on call. SEEK MEDICAL CARE IF:  You have nausea and vomiting that continue the day after anesthesia.  You develop a rash. SEEK IMMEDIATE MEDICAL CARE IF:   You have difficulty breathing.  You have chest pain.  You have any allergic problems. Document Released: 07/22/2000 Document Revised: 04/20/2013 Document Reviewed: 10/29/2012 [facility] Patient Information 2015 [place], [place]. This information is not intended to replace advice given to you by your health care provider. Make sure you discuss any questions you have with your health care provider.

## 2014-05-30 NOTE — Anesthesia Postprocedure Evaluation (Signed)
  Anesthesia Post-op Note  Patient: Kimberly Larsen  Procedure(s) Performed: Procedure(s): CYSTOSCOPY WITH URETEROSCOPY AND STENT PLACEMENT (Right) LEFT RETROGRADE (Left)  Patient Location: PACU  Anesthesia Type:General  Level of Consciousness: awake and alert   Airway and Oxygen Therapy: Patient Spontanous Breathing  Post-op Pain: mild  Post-op Assessment: Post-op Vital signs reviewed  Post-op Vital Signs: stable  Last Vitals:  Filed Vitals:   05/30/14 1625  BP: 123/51  Pulse: 62  Temp: 36.6 C  Resp: 16    Complications: No apparent anesthesia complications

## 2014-05-30 NOTE — Op Note (Signed)
Preoperative diagnosis: Right renal calculi  Postoperative diagnosis: Right renal calculi  Procedure:  1. Cystoscopy 2. Right ureteroscopy and stone removal 3. Right ureteral stent placement (6 x 24 with string) 4. Left retrograde pyelography with interpretation  Surgeon: Pryor Curia. M.D.  Anesthesia: General  Complications: None  Intraoperative findings: Left retrograde pyelography demonstrated a normal caliber ureter without filling defects or other abnormalities.  While there was some fullness of the renal pelvis, there were sharp calyces without evidence of obstruction.   EBL: Minimal  Specimens: 1. Right renal calculi  Disposition of specimens: [facility] for stone analysis  Indication: Kimberly Larsen  is a 69 y.o. patient with urolithiasis including known right renal calcui.  She also was recently noted to have left hydronephrosis on ultrasound after her recent left ureteroscopic procedure. After reviewing the management options for treatment, they elected to proceed with the above surgical procedure(s). We have discussed the potential benefits and risks of the procedure, side effects of the proposed treatment, the likelihood of the patient achieving the goals of the procedure, and any potential problems that might occur during the procedure or recuperation. Informed consent has been obtained.  Description of procedure:  The patient was taken to the operating room and general anesthesia was induced.  The patient was placed in the dorsal lithotomy position, prepped and draped in the usual sterile fashion, and preoperative antibiotics were administered. A preoperative time-out was performed.   Cystourethroscopy was performed.  The patient's urethra was examined and was normal. The bladder was then systematically examined in its entirety. There was no evidence for any bladder tumors, stones, or other mucosal pathology.    Attention then turned to the  left ureteral orifice and a ureteral catheter was used to intubate the ureteral orifice.  Omnipaque contrast was injected through the ureteral catheter and a retrograde pyelogram was performed with findings as dictated above.  There was no evidence of obstruction and attention then turned to the right ureteral orifice and the indwelling right ureteral stent was identified.  The stent was brought to the urethral meatus with the flexible grasper. A 0.38 sensor guidewire was then advanced up the right ureter into the renal pelvis under fluoroscopic guidance.  A 12/14 Fr ureteral access sheath was then advanced over the guide wire. The digital flexible ureteroscope was then advanced through the access sheath into the ureter next to the guidewire and the renal collecting system was systematically examined with multiple calculi identified in various calyces.  All visible stones were then removed with a zero tip nitinol basket.  Reinspection of the ureter/renal pelvis revealed no remaining visible stones or fragments of significant size.   The safety wire was then replaced and the access sheath removed.  The guidewire was backloaded through the cystoscope and a ureteral stent was advance over the wire using Seldinger technique.  The stent was positioned appropriately under fluoroscopic and cystoscopic guidance.  The wire was then removed with an adequate stent curl noted in the renal pelvis as well as in the bladder.  The bladder was then emptied and the procedure ended.  The patient appeared to tolerate the procedure well and without complications.  The patient was able to be awakened and transferred to the recovery unit in satisfactory condition.   Pryor Curia MD

## 2014-05-30 NOTE — Anesthesia Preprocedure Evaluation (Signed)
Anesthesia Evaluation  Patient identified by MRN, date of birth, ID band Patient awake    Reviewed: Allergy & Precautions, NPO status   Airway Mallampati: II   Neck ROM: Full    Dental  (+) Teeth Intact   Pulmonary neg pulmonary ROS,  breath sounds clear to auscultation        Cardiovascular hypertension, Rhythm:Regular Rate:Normal     Neuro/Psych Anxiety  Neuromuscular disease    GI/Hepatic   Endo/Other    Renal/GU Renal InsufficiencyRenal disease     Musculoskeletal  (+) Arthritis -, Fibromyalgia -Back pain   Abdominal   Peds  Hematology  (+) anemia ,   Anesthesia Other Findings   Reproductive/Obstetrics                             Anesthesia Physical Anesthesia Plan  ASA: II  Anesthesia Plan: General   Post-op Pain Management:    Induction: Intravenous  Airway Management Planned: Oral ETT and LMA  Additional Equipment:   Intra-op Plan:   Post-operative Plan: Extubation in OR  Informed Consent: I have reviewed the patients History and Physical, chart, labs and discussed the procedure including the risks, benefits and alternatives for the proposed anesthesia with the patient or authorized representative who has indicated his/her understanding and acceptance.   Dental advisory given  Plan Discussed with: CRNA and Surgeon  Anesthesia Plan Comments:         Anesthesia Quick Evaluation

## 2014-05-31 ENCOUNTER — Encounter (HOSPITAL_COMMUNITY): Payer: Self-pay | Admitting: Urology

## 2014-06-10 DIAGNOSIS — Z1231 Encounter for screening mammogram for malignant neoplasm of breast: Secondary | ICD-10-CM | POA: Diagnosis not present

## 2014-06-15 DIAGNOSIS — F329 Major depressive disorder, single episode, unspecified: Secondary | ICD-10-CM | POA: Diagnosis not present

## 2014-06-15 DIAGNOSIS — M545 Low back pain: Secondary | ICD-10-CM | POA: Diagnosis not present

## 2014-06-15 DIAGNOSIS — N289 Disorder of kidney and ureter, unspecified: Secondary | ICD-10-CM | POA: Diagnosis not present

## 2014-06-15 DIAGNOSIS — E780 Pure hypercholesterolemia: Secondary | ICD-10-CM | POA: Diagnosis not present

## 2014-06-15 DIAGNOSIS — I10 Essential (primary) hypertension: Secondary | ICD-10-CM | POA: Diagnosis not present

## 2014-06-15 DIAGNOSIS — N201 Calculus of ureter: Secondary | ICD-10-CM | POA: Diagnosis not present

## 2014-06-16 DIAGNOSIS — N201 Calculus of ureter: Secondary | ICD-10-CM | POA: Diagnosis not present

## 2014-06-16 DIAGNOSIS — E780 Pure hypercholesterolemia: Secondary | ICD-10-CM | POA: Diagnosis not present

## 2014-06-16 DIAGNOSIS — N200 Calculus of kidney: Secondary | ICD-10-CM | POA: Diagnosis not present

## 2014-06-16 DIAGNOSIS — I10 Essential (primary) hypertension: Secondary | ICD-10-CM | POA: Diagnosis not present

## 2014-06-22 DIAGNOSIS — M549 Dorsalgia, unspecified: Secondary | ICD-10-CM | POA: Diagnosis not present

## 2014-06-22 DIAGNOSIS — G894 Chronic pain syndrome: Secondary | ICD-10-CM | POA: Diagnosis not present

## 2014-06-22 DIAGNOSIS — M961 Postlaminectomy syndrome, not elsewhere classified: Secondary | ICD-10-CM | POA: Diagnosis not present

## 2014-06-22 DIAGNOSIS — M5416 Radiculopathy, lumbar region: Secondary | ICD-10-CM | POA: Diagnosis not present

## 2014-06-24 DIAGNOSIS — M7541 Impingement syndrome of right shoulder: Secondary | ICD-10-CM | POA: Diagnosis not present

## 2014-06-28 DIAGNOSIS — N209 Urinary calculus, unspecified: Secondary | ICD-10-CM | POA: Diagnosis not present

## 2014-06-28 DIAGNOSIS — N132 Hydronephrosis with renal and ureteral calculous obstruction: Secondary | ICD-10-CM | POA: Diagnosis not present

## 2014-07-04 DIAGNOSIS — H02823 Cysts of right eye, unspecified eyelid: Secondary | ICD-10-CM | POA: Diagnosis not present

## 2014-07-07 ENCOUNTER — Ambulatory Visit (INDEPENDENT_AMBULATORY_CARE_PROVIDER_SITE_OTHER): Payer: Medicare Other

## 2014-07-07 VITALS — BP 115/60 | HR 83 | Resp 18

## 2014-07-07 DIAGNOSIS — R52 Pain, unspecified: Secondary | ICD-10-CM

## 2014-07-07 DIAGNOSIS — M7752 Other enthesopathy of left foot: Secondary | ICD-10-CM

## 2014-07-07 DIAGNOSIS — M7672 Peroneal tendinitis, left leg: Secondary | ICD-10-CM | POA: Diagnosis not present

## 2014-07-07 DIAGNOSIS — M722 Plantar fascial fibromatosis: Secondary | ICD-10-CM

## 2014-07-07 DIAGNOSIS — M779 Enthesopathy, unspecified: Secondary | ICD-10-CM

## 2014-07-07 DIAGNOSIS — M778 Other enthesopathies, not elsewhere classified: Secondary | ICD-10-CM

## 2014-07-07 MED ORDER — MELOXICAM 15 MG PO TABS: 15.0000 mg | ORAL_TABLET | Freq: Every day | ORAL | Status: DC

## 2014-07-07 NOTE — Patient Instructions (Signed)

## 2014-07-07 NOTE — Progress Notes (Signed)
   Subjective:    [name], female    DOB: 07/30/1945, 69 y.o.   MRN: [id number]  HPI my left foot has some pain in it and has been going on for about a month now and hurts on the bottom and hurts on the side and my 5th toe is numb and sore and tender and i am not a diabetic    Review of Systems  Constitutional: Positive for activity change and fatigue.  Cardiovascular: Positive for leg swelling.       Calf pain with walking   Musculoskeletal: Positive for back pain and gait problem.       Joint or muscle pain  Neurological: Positive for numbness.  All other systems reviewed and are negative.      Objective:   Physical Exam 69 year old white female well-developed well-nourished oriented 3 presents this time with left foot pain points to the left posterior neck she inferior heel and mid arch area of left foot some on the lateral side does have some numbness in her fifth toe however does have a history of back problems back surgery which may of left with some radiculopathy or neuropathy peripherally. On exam at this time there is pain along the peroneal tendon insertion x-rays demonstrate a small os personally or os peroneum noted on oblique and lateral projections. There may be an old fracture navicular area noted on lateral projection as well noticed no significant displacement noted on orthopedic biomechanical exam rectus foot type mild flexible digital contractures noted mild fascial thickening and inferior calcaneal spurring noted no cysts tumors or other osseous abnormalities are identified. Patient does go barefoot around the house however otherwise is wearing a good pair shoes or new balance type shoe most other times. There is no history of acute injury or trauma. Again pedal pulses palpable DP and PT +2 over 4 Refill time 3 seconds epicritic and proprioceptive sensations intact and symmetric. There is decreased sensation to the lateral foot and toes which is indicated  initial presentation to back problems and radiculopathy.       Assessment & Plan:  Assessment plantar fasciitis/heel spur syndrome there is definite acute pain on palpation of the medial and central band plantar fascia from Can tubercle to mid arch. There is tenderness on dorsiflexion and inversion there is also some slight tenderness on palpation over the peroneal tendon insertion site over the os peroneum. Accessory os recently on. At this time we'll consider conservative care patient is placed on a regimen of meloxicam 15 g recommend ice to the area a fascial strapping applied to the left foot recess within 2 weeks for follow-up may be candidate for orthoses based on findings and progress if not improved more aggressive options clinic steroid injections may need to be considered  Harriet Masson DPM

## 2014-07-21 ENCOUNTER — Ambulatory Visit (INDEPENDENT_AMBULATORY_CARE_PROVIDER_SITE_OTHER): Payer: Medicare Other

## 2014-07-21 VITALS — BP 110/62 | HR 75 | Resp 18

## 2014-07-21 DIAGNOSIS — M7672 Peroneal tendinitis, left leg: Secondary | ICD-10-CM

## 2014-07-21 DIAGNOSIS — M7752 Other enthesopathy of left foot: Secondary | ICD-10-CM

## 2014-07-21 DIAGNOSIS — M722 Plantar fascial fibromatosis: Secondary | ICD-10-CM

## 2014-07-21 DIAGNOSIS — R52 Pain, unspecified: Secondary | ICD-10-CM

## 2014-07-21 DIAGNOSIS — M778 Other enthesopathies, not elsewhere classified: Secondary | ICD-10-CM

## 2014-07-21 DIAGNOSIS — M779 Enthesopathy, unspecified: Secondary | ICD-10-CM

## 2014-07-21 NOTE — Progress Notes (Signed)
   Subjective:    [name], female    DOB: June 24, 1945, 69 y.o.   MRN: [id number]  HPI my left heel is doing better and the tape helped    Review of Systems no new findings or systemic changes noted     Objective:   Physical Exam Neurovascular status is intact pedal pulses are palpable patient's foot definite had improvement well fascial strapping and taping was in place however since then is back to being somewhat sore and tender inferior calcaneal tubercle area left heel. A side effect of improvement with strapping patient is a candidate for orthoses at this time an option of a OTC orthotic such as a power step insoles given as alternative to a custom orthoses which are not covered by her Medicare. Should note no other new changes noted neurovascular status remainder the exam otherwise unremarkable       Assessment & Plan:  Assessment plantar fasciitis/heel spur syndrome capsulitis of the left foot did respond to biomechanical support of the fascial strapping should benefit from orthoses this time power step orthotic are dispensed with break in wearing instructions written instructions are given recheck in a month or 2 for adjustments if needed if not improving with OTC orthotics a prescription orthoses may be considered in the future. ABN form for orthotic is reviewed and signed understanding is a noncovered service  Harriet Masson DPM

## 2014-07-21 NOTE — Patient Instructions (Signed)

## 2014-07-28 DIAGNOSIS — M722 Plantar fascial fibromatosis: Secondary | ICD-10-CM | POA: Diagnosis not present

## 2014-07-28 DIAGNOSIS — I10 Essential (primary) hypertension: Secondary | ICD-10-CM | POA: Diagnosis not present

## 2014-08-01 DIAGNOSIS — H04123 Dry eye syndrome of bilateral lacrimal glands: Secondary | ICD-10-CM | POA: Diagnosis not present

## 2014-08-03 DIAGNOSIS — M722 Plantar fascial fibromatosis: Secondary | ICD-10-CM | POA: Diagnosis not present

## 2014-08-03 DIAGNOSIS — M7541 Impingement syndrome of right shoulder: Secondary | ICD-10-CM | POA: Diagnosis not present

## 2014-08-05 DIAGNOSIS — Z79891 Long term (current) use of opiate analgesic: Secondary | ICD-10-CM | POA: Diagnosis not present

## 2014-08-05 DIAGNOSIS — M961 Postlaminectomy syndrome, not elsewhere classified: Secondary | ICD-10-CM | POA: Diagnosis not present

## 2014-08-05 DIAGNOSIS — G894 Chronic pain syndrome: Secondary | ICD-10-CM | POA: Diagnosis not present

## 2014-08-08 DIAGNOSIS — M722 Plantar fascial fibromatosis: Secondary | ICD-10-CM | POA: Diagnosis not present

## 2014-08-10 DIAGNOSIS — M722 Plantar fascial fibromatosis: Secondary | ICD-10-CM | POA: Diagnosis not present

## 2014-08-11 DIAGNOSIS — F430 Acute stress reaction: Secondary | ICD-10-CM | POA: Diagnosis not present

## 2014-08-11 DIAGNOSIS — Z79899 Other long term (current) drug therapy: Secondary | ICD-10-CM | POA: Diagnosis not present

## 2014-08-11 DIAGNOSIS — I659 Occlusion and stenosis of unspecified precerebral artery: Secondary | ICD-10-CM | POA: Diagnosis not present

## 2014-08-11 DIAGNOSIS — Z79891 Long term (current) use of opiate analgesic: Secondary | ICD-10-CM | POA: Diagnosis not present

## 2014-08-11 DIAGNOSIS — L658 Other specified nonscarring hair loss: Secondary | ICD-10-CM | POA: Diagnosis not present

## 2014-08-12 DIAGNOSIS — M722 Plantar fascial fibromatosis: Secondary | ICD-10-CM | POA: Diagnosis not present

## 2014-08-14 DIAGNOSIS — N200 Calculus of kidney: Secondary | ICD-10-CM | POA: Diagnosis not present

## 2014-08-15 DIAGNOSIS — N200 Calculus of kidney: Secondary | ICD-10-CM | POA: Diagnosis not present

## 2014-08-16 DIAGNOSIS — M722 Plantar fascial fibromatosis: Secondary | ICD-10-CM | POA: Diagnosis not present

## 2014-08-18 DIAGNOSIS — M722 Plantar fascial fibromatosis: Secondary | ICD-10-CM | POA: Diagnosis not present

## 2014-08-19 DIAGNOSIS — M722 Plantar fascial fibromatosis: Secondary | ICD-10-CM | POA: Diagnosis not present

## 2014-08-19 DIAGNOSIS — M7541 Impingement syndrome of right shoulder: Secondary | ICD-10-CM | POA: Diagnosis not present

## 2014-08-23 DIAGNOSIS — M722 Plantar fascial fibromatosis: Secondary | ICD-10-CM | POA: Diagnosis not present

## 2014-08-23 DIAGNOSIS — I10 Essential (primary) hypertension: Secondary | ICD-10-CM | POA: Diagnosis not present

## 2014-08-23 DIAGNOSIS — R609 Edema, unspecified: Secondary | ICD-10-CM | POA: Diagnosis not present

## 2014-08-26 DIAGNOSIS — M722 Plantar fascial fibromatosis: Secondary | ICD-10-CM | POA: Diagnosis not present

## 2014-08-29 DIAGNOSIS — M722 Plantar fascial fibromatosis: Secondary | ICD-10-CM | POA: Diagnosis not present

## 2014-09-02 DIAGNOSIS — M7541 Impingement syndrome of right shoulder: Secondary | ICD-10-CM | POA: Diagnosis not present

## 2014-09-02 DIAGNOSIS — M722 Plantar fascial fibromatosis: Secondary | ICD-10-CM | POA: Diagnosis not present

## 2014-09-02 DIAGNOSIS — M75121 Complete rotator cuff tear or rupture of right shoulder, not specified as traumatic: Secondary | ICD-10-CM | POA: Diagnosis not present

## 2014-09-09 DIAGNOSIS — M722 Plantar fascial fibromatosis: Secondary | ICD-10-CM | POA: Diagnosis not present

## 2014-09-12 DIAGNOSIS — H01022 Squamous blepharitis right lower eyelid: Secondary | ICD-10-CM | POA: Diagnosis not present

## 2014-09-16 DIAGNOSIS — M722 Plantar fascial fibromatosis: Secondary | ICD-10-CM | POA: Diagnosis not present

## 2014-09-20 DIAGNOSIS — E780 Pure hypercholesterolemia: Secondary | ICD-10-CM | POA: Diagnosis not present

## 2014-09-20 DIAGNOSIS — I10 Essential (primary) hypertension: Secondary | ICD-10-CM | POA: Diagnosis not present

## 2014-09-20 DIAGNOSIS — F329 Major depressive disorder, single episode, unspecified: Secondary | ICD-10-CM | POA: Diagnosis not present

## 2014-10-18 DIAGNOSIS — M9901 Segmental and somatic dysfunction of cervical region: Secondary | ICD-10-CM | POA: Diagnosis not present

## 2014-10-18 DIAGNOSIS — M542 Cervicalgia: Secondary | ICD-10-CM | POA: Diagnosis not present

## 2014-10-18 DIAGNOSIS — M9902 Segmental and somatic dysfunction of thoracic region: Secondary | ICD-10-CM | POA: Diagnosis not present

## 2014-10-18 DIAGNOSIS — M545 Low back pain: Secondary | ICD-10-CM | POA: Diagnosis not present

## 2014-10-18 DIAGNOSIS — M9903 Segmental and somatic dysfunction of lumbar region: Secondary | ICD-10-CM | POA: Diagnosis not present

## 2014-10-24 ENCOUNTER — Other Ambulatory Visit: Payer: Self-pay

## 2014-10-27 DIAGNOSIS — E669 Obesity, unspecified: Secondary | ICD-10-CM | POA: Diagnosis not present

## 2014-10-27 DIAGNOSIS — B351 Tinea unguium: Secondary | ICD-10-CM | POA: Diagnosis not present

## 2014-10-27 DIAGNOSIS — M8589 Other specified disorders of bone density and structure, multiple sites: Secondary | ICD-10-CM | POA: Diagnosis not present

## 2014-10-27 DIAGNOSIS — I10 Essential (primary) hypertension: Secondary | ICD-10-CM | POA: Diagnosis not present

## 2014-10-27 DIAGNOSIS — Z0000 Encounter for general adult medical examination without abnormal findings: Secondary | ICD-10-CM | POA: Diagnosis not present

## 2014-10-27 DIAGNOSIS — Z6835 Body mass index (BMI) 35.0-35.9, adult: Secondary | ICD-10-CM | POA: Diagnosis not present

## 2014-10-27 DIAGNOSIS — R7301 Impaired fasting glucose: Secondary | ICD-10-CM | POA: Diagnosis not present

## 2014-10-27 DIAGNOSIS — Z23 Encounter for immunization: Secondary | ICD-10-CM | POA: Diagnosis not present

## 2014-11-04 DIAGNOSIS — M961 Postlaminectomy syndrome, not elsewhere classified: Secondary | ICD-10-CM | POA: Diagnosis not present

## 2014-11-04 DIAGNOSIS — G894 Chronic pain syndrome: Secondary | ICD-10-CM | POA: Diagnosis not present

## 2014-12-08 DIAGNOSIS — H04123 Dry eye syndrome of bilateral lacrimal glands: Secondary | ICD-10-CM | POA: Diagnosis not present

## 2014-12-27 DIAGNOSIS — M9902 Segmental and somatic dysfunction of thoracic region: Secondary | ICD-10-CM | POA: Diagnosis not present

## 2014-12-27 DIAGNOSIS — M545 Low back pain: Secondary | ICD-10-CM | POA: Diagnosis not present

## 2014-12-27 DIAGNOSIS — M542 Cervicalgia: Secondary | ICD-10-CM | POA: Diagnosis not present

## 2014-12-27 DIAGNOSIS — M9903 Segmental and somatic dysfunction of lumbar region: Secondary | ICD-10-CM | POA: Diagnosis not present

## 2014-12-27 DIAGNOSIS — M9901 Segmental and somatic dysfunction of cervical region: Secondary | ICD-10-CM | POA: Diagnosis not present

## 2014-12-29 DIAGNOSIS — L638 Other alopecia areata: Secondary | ICD-10-CM | POA: Diagnosis not present

## 2014-12-29 DIAGNOSIS — L219 Seborrheic dermatitis, unspecified: Secondary | ICD-10-CM | POA: Diagnosis not present

## 2015-01-11 DIAGNOSIS — N200 Calculus of kidney: Secondary | ICD-10-CM | POA: Diagnosis not present

## 2015-01-11 DIAGNOSIS — N132 Hydronephrosis with renal and ureteral calculous obstruction: Secondary | ICD-10-CM | POA: Diagnosis not present

## 2015-01-11 DIAGNOSIS — N209 Urinary calculus, unspecified: Secondary | ICD-10-CM | POA: Diagnosis not present

## 2015-01-26 DIAGNOSIS — Z23 Encounter for immunization: Secondary | ICD-10-CM | POA: Diagnosis not present

## 2015-01-26 DIAGNOSIS — I10 Essential (primary) hypertension: Secondary | ICD-10-CM | POA: Diagnosis not present

## 2015-01-26 DIAGNOSIS — E780 Pure hypercholesterolemia: Secondary | ICD-10-CM | POA: Diagnosis not present

## 2015-01-26 DIAGNOSIS — Z79899 Other long term (current) drug therapy: Secondary | ICD-10-CM | POA: Diagnosis not present

## 2015-01-27 DIAGNOSIS — M961 Postlaminectomy syndrome, not elsewhere classified: Secondary | ICD-10-CM | POA: Diagnosis not present

## 2015-01-27 DIAGNOSIS — G894 Chronic pain syndrome: Secondary | ICD-10-CM | POA: Diagnosis not present

## 2015-01-27 DIAGNOSIS — Z79891 Long term (current) use of opiate analgesic: Secondary | ICD-10-CM | POA: Diagnosis not present

## 2015-02-02 DIAGNOSIS — H18411 Arcus senilis, right eye: Secondary | ICD-10-CM | POA: Diagnosis not present

## 2015-02-02 DIAGNOSIS — H2512 Age-related nuclear cataract, left eye: Secondary | ICD-10-CM | POA: Diagnosis not present

## 2015-02-02 DIAGNOSIS — H02839 Dermatochalasis of unspecified eye, unspecified eyelid: Secondary | ICD-10-CM | POA: Diagnosis not present

## 2015-02-02 DIAGNOSIS — H18412 Arcus senilis, left eye: Secondary | ICD-10-CM | POA: Diagnosis not present

## 2015-02-02 DIAGNOSIS — H2511 Age-related nuclear cataract, right eye: Secondary | ICD-10-CM | POA: Diagnosis not present

## 2015-02-07 DIAGNOSIS — N200 Calculus of kidney: Secondary | ICD-10-CM | POA: Diagnosis not present

## 2015-02-08 DIAGNOSIS — R2689 Other abnormalities of gait and mobility: Secondary | ICD-10-CM | POA: Diagnosis not present

## 2015-02-08 DIAGNOSIS — M25572 Pain in left ankle and joints of left foot: Secondary | ICD-10-CM | POA: Diagnosis not present

## 2015-02-08 DIAGNOSIS — M79672 Pain in left foot: Secondary | ICD-10-CM | POA: Diagnosis not present

## 2015-02-08 DIAGNOSIS — M722 Plantar fascial fibromatosis: Secondary | ICD-10-CM | POA: Diagnosis not present

## 2015-02-09 DIAGNOSIS — L638 Other alopecia areata: Secondary | ICD-10-CM | POA: Diagnosis not present

## 2015-02-15 DIAGNOSIS — R2689 Other abnormalities of gait and mobility: Secondary | ICD-10-CM | POA: Diagnosis not present

## 2015-02-15 DIAGNOSIS — M722 Plantar fascial fibromatosis: Secondary | ICD-10-CM | POA: Diagnosis not present

## 2015-02-15 DIAGNOSIS — M25572 Pain in left ankle and joints of left foot: Secondary | ICD-10-CM | POA: Diagnosis not present

## 2015-02-15 DIAGNOSIS — M79672 Pain in left foot: Secondary | ICD-10-CM | POA: Diagnosis not present

## 2015-02-16 DIAGNOSIS — M79672 Pain in left foot: Secondary | ICD-10-CM | POA: Diagnosis not present

## 2015-02-16 DIAGNOSIS — R2689 Other abnormalities of gait and mobility: Secondary | ICD-10-CM | POA: Diagnosis not present

## 2015-02-16 DIAGNOSIS — M25572 Pain in left ankle and joints of left foot: Secondary | ICD-10-CM | POA: Diagnosis not present

## 2015-02-16 DIAGNOSIS — M722 Plantar fascial fibromatosis: Secondary | ICD-10-CM | POA: Diagnosis not present

## 2015-02-20 DIAGNOSIS — M25572 Pain in left ankle and joints of left foot: Secondary | ICD-10-CM | POA: Diagnosis not present

## 2015-02-20 DIAGNOSIS — M79672 Pain in left foot: Secondary | ICD-10-CM | POA: Diagnosis not present

## 2015-02-20 DIAGNOSIS — R2689 Other abnormalities of gait and mobility: Secondary | ICD-10-CM | POA: Diagnosis not present

## 2015-02-20 DIAGNOSIS — M722 Plantar fascial fibromatosis: Secondary | ICD-10-CM | POA: Diagnosis not present

## 2015-02-22 DIAGNOSIS — R2689 Other abnormalities of gait and mobility: Secondary | ICD-10-CM | POA: Diagnosis not present

## 2015-02-22 DIAGNOSIS — M25572 Pain in left ankle and joints of left foot: Secondary | ICD-10-CM | POA: Diagnosis not present

## 2015-02-22 DIAGNOSIS — M722 Plantar fascial fibromatosis: Secondary | ICD-10-CM | POA: Diagnosis not present

## 2015-02-22 DIAGNOSIS — M79672 Pain in left foot: Secondary | ICD-10-CM | POA: Diagnosis not present

## 2015-02-28 DIAGNOSIS — M25572 Pain in left ankle and joints of left foot: Secondary | ICD-10-CM | POA: Diagnosis not present

## 2015-02-28 DIAGNOSIS — R2689 Other abnormalities of gait and mobility: Secondary | ICD-10-CM | POA: Diagnosis not present

## 2015-02-28 DIAGNOSIS — M722 Plantar fascial fibromatosis: Secondary | ICD-10-CM | POA: Diagnosis not present

## 2015-02-28 DIAGNOSIS — M79672 Pain in left foot: Secondary | ICD-10-CM | POA: Diagnosis not present

## 2015-03-03 DIAGNOSIS — M79672 Pain in left foot: Secondary | ICD-10-CM | POA: Diagnosis not present

## 2015-03-03 DIAGNOSIS — R2689 Other abnormalities of gait and mobility: Secondary | ICD-10-CM | POA: Diagnosis not present

## 2015-03-03 DIAGNOSIS — M722 Plantar fascial fibromatosis: Secondary | ICD-10-CM | POA: Diagnosis not present

## 2015-03-03 DIAGNOSIS — M25572 Pain in left ankle and joints of left foot: Secondary | ICD-10-CM | POA: Diagnosis not present

## 2015-03-07 DIAGNOSIS — M722 Plantar fascial fibromatosis: Secondary | ICD-10-CM | POA: Diagnosis not present

## 2015-03-07 DIAGNOSIS — R2689 Other abnormalities of gait and mobility: Secondary | ICD-10-CM | POA: Diagnosis not present

## 2015-03-07 DIAGNOSIS — M25572 Pain in left ankle and joints of left foot: Secondary | ICD-10-CM | POA: Diagnosis not present

## 2015-03-07 DIAGNOSIS — M79672 Pain in left foot: Secondary | ICD-10-CM | POA: Diagnosis not present

## 2015-03-10 DIAGNOSIS — M722 Plantar fascial fibromatosis: Secondary | ICD-10-CM | POA: Diagnosis not present

## 2015-03-10 DIAGNOSIS — M79672 Pain in left foot: Secondary | ICD-10-CM | POA: Diagnosis not present

## 2015-03-10 DIAGNOSIS — R2689 Other abnormalities of gait and mobility: Secondary | ICD-10-CM | POA: Diagnosis not present

## 2015-03-10 DIAGNOSIS — M25572 Pain in left ankle and joints of left foot: Secondary | ICD-10-CM | POA: Diagnosis not present

## 2015-03-13 DIAGNOSIS — H25812 Combined forms of age-related cataract, left eye: Secondary | ICD-10-CM | POA: Diagnosis not present

## 2015-03-13 DIAGNOSIS — H2512 Age-related nuclear cataract, left eye: Secondary | ICD-10-CM | POA: Diagnosis not present

## 2015-03-13 DIAGNOSIS — H259 Unspecified age-related cataract: Secondary | ICD-10-CM | POA: Diagnosis not present

## 2015-03-14 DIAGNOSIS — H2511 Age-related nuclear cataract, right eye: Secondary | ICD-10-CM | POA: Diagnosis not present

## 2015-03-15 DIAGNOSIS — M722 Plantar fascial fibromatosis: Secondary | ICD-10-CM | POA: Diagnosis not present

## 2015-03-15 DIAGNOSIS — M25572 Pain in left ankle and joints of left foot: Secondary | ICD-10-CM | POA: Diagnosis not present

## 2015-03-15 DIAGNOSIS — R2689 Other abnormalities of gait and mobility: Secondary | ICD-10-CM | POA: Diagnosis not present

## 2015-03-15 DIAGNOSIS — M79672 Pain in left foot: Secondary | ICD-10-CM | POA: Diagnosis not present

## 2015-03-17 DIAGNOSIS — M722 Plantar fascial fibromatosis: Secondary | ICD-10-CM | POA: Diagnosis not present

## 2015-03-17 DIAGNOSIS — R2689 Other abnormalities of gait and mobility: Secondary | ICD-10-CM | POA: Diagnosis not present

## 2015-03-17 DIAGNOSIS — M25572 Pain in left ankle and joints of left foot: Secondary | ICD-10-CM | POA: Diagnosis not present

## 2015-03-17 DIAGNOSIS — M79672 Pain in left foot: Secondary | ICD-10-CM | POA: Diagnosis not present

## 2015-03-20 DIAGNOSIS — Z471 Aftercare following joint replacement surgery: Secondary | ICD-10-CM | POA: Diagnosis not present

## 2015-03-20 DIAGNOSIS — Z96651 Presence of right artificial knee joint: Secondary | ICD-10-CM | POA: Diagnosis not present

## 2015-03-20 DIAGNOSIS — M722 Plantar fascial fibromatosis: Secondary | ICD-10-CM | POA: Diagnosis not present

## 2015-03-20 DIAGNOSIS — M79672 Pain in left foot: Secondary | ICD-10-CM | POA: Diagnosis not present

## 2015-03-20 DIAGNOSIS — M25572 Pain in left ankle and joints of left foot: Secondary | ICD-10-CM | POA: Diagnosis not present

## 2015-03-20 DIAGNOSIS — M25561 Pain in right knee: Secondary | ICD-10-CM | POA: Diagnosis not present

## 2015-03-20 DIAGNOSIS — R2689 Other abnormalities of gait and mobility: Secondary | ICD-10-CM | POA: Diagnosis not present

## 2015-03-22 DIAGNOSIS — M25572 Pain in left ankle and joints of left foot: Secondary | ICD-10-CM | POA: Diagnosis not present

## 2015-03-22 DIAGNOSIS — M722 Plantar fascial fibromatosis: Secondary | ICD-10-CM | POA: Diagnosis not present

## 2015-03-22 DIAGNOSIS — M79672 Pain in left foot: Secondary | ICD-10-CM | POA: Diagnosis not present

## 2015-03-22 DIAGNOSIS — R2689 Other abnormalities of gait and mobility: Secondary | ICD-10-CM | POA: Diagnosis not present

## 2015-03-28 DIAGNOSIS — M25572 Pain in left ankle and joints of left foot: Secondary | ICD-10-CM | POA: Diagnosis not present

## 2015-03-28 DIAGNOSIS — M79672 Pain in left foot: Secondary | ICD-10-CM | POA: Diagnosis not present

## 2015-03-28 DIAGNOSIS — R2689 Other abnormalities of gait and mobility: Secondary | ICD-10-CM | POA: Diagnosis not present

## 2015-03-28 DIAGNOSIS — M722 Plantar fascial fibromatosis: Secondary | ICD-10-CM | POA: Diagnosis not present

## 2015-03-30 DIAGNOSIS — R2689 Other abnormalities of gait and mobility: Secondary | ICD-10-CM | POA: Diagnosis not present

## 2015-03-30 DIAGNOSIS — M79672 Pain in left foot: Secondary | ICD-10-CM | POA: Diagnosis not present

## 2015-03-30 DIAGNOSIS — M722 Plantar fascial fibromatosis: Secondary | ICD-10-CM | POA: Diagnosis not present

## 2015-03-30 DIAGNOSIS — M25572 Pain in left ankle and joints of left foot: Secondary | ICD-10-CM | POA: Diagnosis not present

## 2015-04-03 DIAGNOSIS — H25811 Combined forms of age-related cataract, right eye: Secondary | ICD-10-CM | POA: Diagnosis not present

## 2015-04-03 DIAGNOSIS — H259 Unspecified age-related cataract: Secondary | ICD-10-CM | POA: Diagnosis not present

## 2015-04-03 DIAGNOSIS — H2511 Age-related nuclear cataract, right eye: Secondary | ICD-10-CM | POA: Diagnosis not present

## 2015-04-05 DIAGNOSIS — M25572 Pain in left ankle and joints of left foot: Secondary | ICD-10-CM | POA: Diagnosis not present

## 2015-04-05 DIAGNOSIS — M79672 Pain in left foot: Secondary | ICD-10-CM | POA: Diagnosis not present

## 2015-04-05 DIAGNOSIS — R2689 Other abnormalities of gait and mobility: Secondary | ICD-10-CM | POA: Diagnosis not present

## 2015-04-05 DIAGNOSIS — M722 Plantar fascial fibromatosis: Secondary | ICD-10-CM | POA: Diagnosis not present

## 2015-04-19 DIAGNOSIS — J069 Acute upper respiratory infection, unspecified: Secondary | ICD-10-CM | POA: Diagnosis not present

## 2015-04-19 DIAGNOSIS — H6502 Acute serous otitis media, left ear: Secondary | ICD-10-CM | POA: Diagnosis not present

## 2015-04-21 DIAGNOSIS — G894 Chronic pain syndrome: Secondary | ICD-10-CM | POA: Diagnosis not present

## 2015-04-21 DIAGNOSIS — Z79891 Long term (current) use of opiate analgesic: Secondary | ICD-10-CM | POA: Diagnosis not present

## 2015-04-21 DIAGNOSIS — M961 Postlaminectomy syndrome, not elsewhere classified: Secondary | ICD-10-CM | POA: Diagnosis not present

## 2015-05-15 DIAGNOSIS — Z96651 Presence of right artificial knee joint: Secondary | ICD-10-CM | POA: Diagnosis not present

## 2015-05-15 DIAGNOSIS — M2291 Unspecified disorder of patella, right knee: Secondary | ICD-10-CM | POA: Diagnosis not present

## 2015-05-18 DIAGNOSIS — Z96651 Presence of right artificial knee joint: Secondary | ICD-10-CM | POA: Diagnosis not present

## 2015-05-18 DIAGNOSIS — M2291 Unspecified disorder of patella, right knee: Secondary | ICD-10-CM | POA: Diagnosis not present

## 2015-05-22 DIAGNOSIS — Z96651 Presence of right artificial knee joint: Secondary | ICD-10-CM | POA: Diagnosis not present

## 2015-05-22 DIAGNOSIS — M2291 Unspecified disorder of patella, right knee: Secondary | ICD-10-CM | POA: Diagnosis not present

## 2015-05-24 DIAGNOSIS — Z96651 Presence of right artificial knee joint: Secondary | ICD-10-CM | POA: Diagnosis not present

## 2015-05-24 DIAGNOSIS — M2291 Unspecified disorder of patella, right knee: Secondary | ICD-10-CM | POA: Diagnosis not present

## 2015-05-29 DIAGNOSIS — Z96651 Presence of right artificial knee joint: Secondary | ICD-10-CM | POA: Diagnosis not present

## 2015-05-29 DIAGNOSIS — M2291 Unspecified disorder of patella, right knee: Secondary | ICD-10-CM | POA: Diagnosis not present

## 2015-06-01 DIAGNOSIS — Z96651 Presence of right artificial knee joint: Secondary | ICD-10-CM | POA: Diagnosis not present

## 2015-06-01 DIAGNOSIS — M2291 Unspecified disorder of patella, right knee: Secondary | ICD-10-CM | POA: Diagnosis not present

## 2015-06-05 DIAGNOSIS — M2291 Unspecified disorder of patella, right knee: Secondary | ICD-10-CM | POA: Diagnosis not present

## 2015-06-05 DIAGNOSIS — Z96651 Presence of right artificial knee joint: Secondary | ICD-10-CM | POA: Diagnosis not present

## 2015-06-08 DIAGNOSIS — Z96651 Presence of right artificial knee joint: Secondary | ICD-10-CM | POA: Diagnosis not present

## 2015-06-08 DIAGNOSIS — M2291 Unspecified disorder of patella, right knee: Secondary | ICD-10-CM | POA: Diagnosis not present

## 2015-06-12 DIAGNOSIS — M7752 Other enthesopathy of left foot: Secondary | ICD-10-CM | POA: Diagnosis not present

## 2015-06-12 DIAGNOSIS — M7672 Peroneal tendinitis, left leg: Secondary | ICD-10-CM | POA: Diagnosis not present

## 2015-06-12 DIAGNOSIS — M722 Plantar fascial fibromatosis: Secondary | ICD-10-CM | POA: Diagnosis not present

## 2015-06-13 DIAGNOSIS — L650 Telogen effluvium: Secondary | ICD-10-CM | POA: Diagnosis not present

## 2015-06-13 DIAGNOSIS — L219 Seborrheic dermatitis, unspecified: Secondary | ICD-10-CM | POA: Diagnosis not present

## 2015-06-19 DIAGNOSIS — H04123 Dry eye syndrome of bilateral lacrimal glands: Secondary | ICD-10-CM | POA: Diagnosis not present

## 2015-06-21 DIAGNOSIS — G5781 Other specified mononeuropathies of right lower limb: Secondary | ICD-10-CM | POA: Diagnosis not present

## 2015-06-21 DIAGNOSIS — Z96651 Presence of right artificial knee joint: Secondary | ICD-10-CM | POA: Diagnosis not present

## 2015-06-21 DIAGNOSIS — Z471 Aftercare following joint replacement surgery: Secondary | ICD-10-CM | POA: Diagnosis not present

## 2015-07-10 DIAGNOSIS — H04123 Dry eye syndrome of bilateral lacrimal glands: Secondary | ICD-10-CM | POA: Diagnosis not present

## 2015-07-17 DIAGNOSIS — Z1231 Encounter for screening mammogram for malignant neoplasm of breast: Secondary | ICD-10-CM | POA: Diagnosis not present

## 2015-07-27 DIAGNOSIS — F329 Major depressive disorder, single episode, unspecified: Secondary | ICD-10-CM | POA: Diagnosis not present

## 2015-07-27 DIAGNOSIS — I10 Essential (primary) hypertension: Secondary | ICD-10-CM | POA: Diagnosis not present

## 2015-07-27 DIAGNOSIS — R7301 Impaired fasting glucose: Secondary | ICD-10-CM | POA: Diagnosis not present

## 2015-07-27 DIAGNOSIS — E7800 Pure hypercholesterolemia, unspecified: Secondary | ICD-10-CM | POA: Diagnosis not present

## 2015-07-31 DIAGNOSIS — M6281 Muscle weakness (generalized): Secondary | ICD-10-CM | POA: Diagnosis not present

## 2015-07-31 DIAGNOSIS — M25572 Pain in left ankle and joints of left foot: Secondary | ICD-10-CM | POA: Diagnosis not present

## 2015-08-03 DIAGNOSIS — M25572 Pain in left ankle and joints of left foot: Secondary | ICD-10-CM | POA: Diagnosis not present

## 2015-08-03 DIAGNOSIS — M6281 Muscle weakness (generalized): Secondary | ICD-10-CM | POA: Diagnosis not present

## 2015-08-07 DIAGNOSIS — M6281 Muscle weakness (generalized): Secondary | ICD-10-CM | POA: Diagnosis not present

## 2015-08-07 DIAGNOSIS — M25572 Pain in left ankle and joints of left foot: Secondary | ICD-10-CM | POA: Diagnosis not present

## 2015-08-09 DIAGNOSIS — M6281 Muscle weakness (generalized): Secondary | ICD-10-CM | POA: Diagnosis not present

## 2015-08-09 DIAGNOSIS — M25572 Pain in left ankle and joints of left foot: Secondary | ICD-10-CM | POA: Diagnosis not present

## 2015-08-14 DIAGNOSIS — M25572 Pain in left ankle and joints of left foot: Secondary | ICD-10-CM | POA: Diagnosis not present

## 2015-08-14 DIAGNOSIS — M6281 Muscle weakness (generalized): Secondary | ICD-10-CM | POA: Diagnosis not present

## 2015-08-16 IMAGING — DX DG CHEST 1V PORT
1 series · 1 of 1 positions shown · non-contrast
Comparison: 03/25/2014, 10/24/2011

CLINICAL DATA: Postop cough, cystoscopy today

EXAM:
PORTABLE CHEST - 1 VIEW

[chest ap]
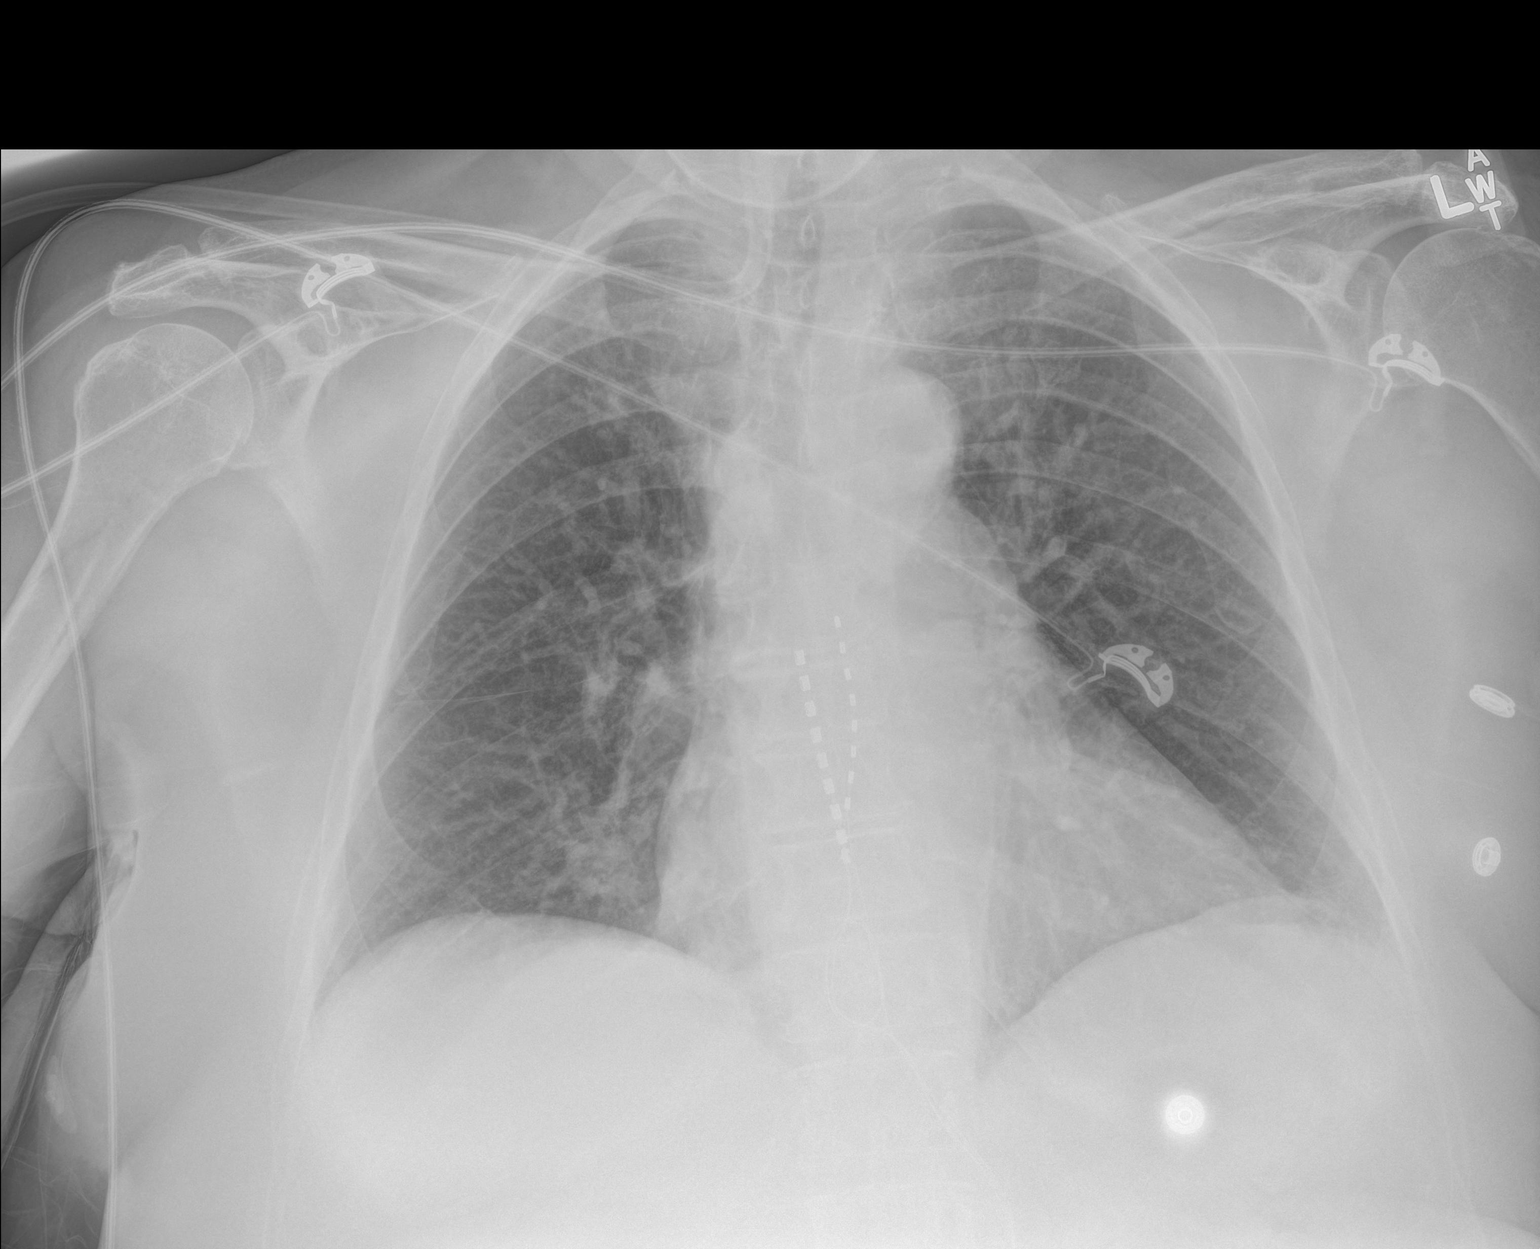

[1 of 1 positions shown; findings below may reference images not displayed]

FINDINGS: There is a lobulated opacity projecting over the right lower lung
medially likely representing a confluence of bronchovascular
structures. There is no focal parenchymal opacity, pleural effusion,
or pneumothorax. The heart and mediastinal contours are
unremarkable.

There is a spinal stimulator noted. There is no acute osseous
abnormality.
IMPRESSION: No active disease.

## 2015-08-17 DIAGNOSIS — M6281 Muscle weakness (generalized): Secondary | ICD-10-CM | POA: Diagnosis not present

## 2015-08-17 DIAGNOSIS — M25572 Pain in left ankle and joints of left foot: Secondary | ICD-10-CM | POA: Diagnosis not present

## 2015-08-21 DIAGNOSIS — M25572 Pain in left ankle and joints of left foot: Secondary | ICD-10-CM | POA: Diagnosis not present

## 2015-08-21 DIAGNOSIS — M6281 Muscle weakness (generalized): Secondary | ICD-10-CM | POA: Diagnosis not present

## 2015-08-23 DIAGNOSIS — M6281 Muscle weakness (generalized): Secondary | ICD-10-CM | POA: Diagnosis not present

## 2015-08-23 DIAGNOSIS — M25572 Pain in left ankle and joints of left foot: Secondary | ICD-10-CM | POA: Diagnosis not present

## 2015-08-24 DIAGNOSIS — M961 Postlaminectomy syndrome, not elsewhere classified: Secondary | ICD-10-CM | POA: Diagnosis not present

## 2015-08-24 DIAGNOSIS — Z79891 Long term (current) use of opiate analgesic: Secondary | ICD-10-CM | POA: Diagnosis not present

## 2015-09-07 DIAGNOSIS — M533 Sacrococcygeal disorders, not elsewhere classified: Secondary | ICD-10-CM | POA: Diagnosis not present

## 2015-09-08 DIAGNOSIS — H18411 Arcus senilis, right eye: Secondary | ICD-10-CM | POA: Diagnosis not present

## 2015-09-08 DIAGNOSIS — H26492 Other secondary cataract, left eye: Secondary | ICD-10-CM | POA: Diagnosis not present

## 2015-09-08 DIAGNOSIS — H02839 Dermatochalasis of unspecified eye, unspecified eyelid: Secondary | ICD-10-CM | POA: Diagnosis not present

## 2015-09-08 DIAGNOSIS — H18412 Arcus senilis, left eye: Secondary | ICD-10-CM | POA: Diagnosis not present

## 2015-09-15 DIAGNOSIS — K635 Polyp of colon: Secondary | ICD-10-CM | POA: Diagnosis not present

## 2015-09-15 DIAGNOSIS — D124 Benign neoplasm of descending colon: Secondary | ICD-10-CM | POA: Diagnosis not present

## 2015-09-15 DIAGNOSIS — Z1211 Encounter for screening for malignant neoplasm of colon: Secondary | ICD-10-CM | POA: Diagnosis not present

## 2015-09-18 DIAGNOSIS — L600 Ingrowing nail: Secondary | ICD-10-CM | POA: Diagnosis not present

## 2015-09-18 DIAGNOSIS — M7752 Other enthesopathy of left foot: Secondary | ICD-10-CM | POA: Diagnosis not present

## 2015-09-21 DIAGNOSIS — R599 Enlarged lymph nodes, unspecified: Secondary | ICD-10-CM | POA: Diagnosis not present

## 2015-09-21 DIAGNOSIS — R5381 Other malaise: Secondary | ICD-10-CM | POA: Diagnosis not present

## 2015-09-27 DIAGNOSIS — M6281 Muscle weakness (generalized): Secondary | ICD-10-CM | POA: Diagnosis not present

## 2015-09-27 DIAGNOSIS — M25572 Pain in left ankle and joints of left foot: Secondary | ICD-10-CM | POA: Diagnosis not present

## 2015-10-06 DIAGNOSIS — M6281 Muscle weakness (generalized): Secondary | ICD-10-CM | POA: Diagnosis not present

## 2015-10-06 DIAGNOSIS — M25572 Pain in left ankle and joints of left foot: Secondary | ICD-10-CM | POA: Diagnosis not present

## 2015-10-11 DIAGNOSIS — M6281 Muscle weakness (generalized): Secondary | ICD-10-CM | POA: Diagnosis not present

## 2015-10-11 DIAGNOSIS — M25572 Pain in left ankle and joints of left foot: Secondary | ICD-10-CM | POA: Diagnosis not present

## 2015-11-01 DIAGNOSIS — J0100 Acute maxillary sinusitis, unspecified: Secondary | ICD-10-CM | POA: Diagnosis not present

## 2015-11-01 DIAGNOSIS — J209 Acute bronchitis, unspecified: Secondary | ICD-10-CM | POA: Diagnosis not present

## 2015-12-21 DIAGNOSIS — M961 Postlaminectomy syndrome, not elsewhere classified: Secondary | ICD-10-CM | POA: Diagnosis not present

## 2015-12-21 DIAGNOSIS — Z79891 Long term (current) use of opiate analgesic: Secondary | ICD-10-CM | POA: Diagnosis not present

## 2015-12-21 DIAGNOSIS — G894 Chronic pain syndrome: Secondary | ICD-10-CM | POA: Diagnosis not present

## 2015-12-27 DIAGNOSIS — N939 Abnormal uterine and vaginal bleeding, unspecified: Secondary | ICD-10-CM | POA: Diagnosis not present

## 2015-12-29 DIAGNOSIS — C541 Malignant neoplasm of endometrium: Secondary | ICD-10-CM | POA: Diagnosis not present

## 2015-12-29 DIAGNOSIS — N950 Postmenopausal bleeding: Secondary | ICD-10-CM | POA: Diagnosis not present

## 2015-12-29 DIAGNOSIS — N882 Stricture and stenosis of cervix uteri: Secondary | ICD-10-CM | POA: Diagnosis not present

## 2015-12-29 DIAGNOSIS — Z124 Encounter for screening for malignant neoplasm of cervix: Secondary | ICD-10-CM | POA: Diagnosis not present

## 2016-01-02 ENCOUNTER — Other Ambulatory Visit: Payer: Self-pay

## 2016-01-10 ENCOUNTER — Ambulatory Visit: Payer: Medicare Other | Attending: Gynecologic Oncology | Admitting: Gynecologic Oncology

## 2016-01-10 ENCOUNTER — Encounter: Payer: Self-pay | Admitting: Gynecologic Oncology

## 2016-01-10 ENCOUNTER — Other Ambulatory Visit (HOSPITAL_BASED_OUTPATIENT_CLINIC_OR_DEPARTMENT_OTHER): Payer: Medicare Other

## 2016-01-10 ENCOUNTER — Other Ambulatory Visit: Payer: Self-pay | Admitting: Gynecologic Oncology

## 2016-01-10 DIAGNOSIS — C541 Malignant neoplasm of endometrium: Secondary | ICD-10-CM | POA: Insufficient documentation

## 2016-01-10 DIAGNOSIS — Z981 Arthrodesis status: Secondary | ICD-10-CM | POA: Diagnosis not present

## 2016-01-10 DIAGNOSIS — F418 Other specified anxiety disorders: Secondary | ICD-10-CM | POA: Insufficient documentation

## 2016-01-10 DIAGNOSIS — C763 Malignant neoplasm of pelvis: Secondary | ICD-10-CM

## 2016-01-10 DIAGNOSIS — Z7982 Long term (current) use of aspirin: Secondary | ICD-10-CM | POA: Diagnosis not present

## 2016-01-10 DIAGNOSIS — M810 Age-related osteoporosis without current pathological fracture: Secondary | ICD-10-CM | POA: Diagnosis not present

## 2016-01-10 DIAGNOSIS — Z87442 Personal history of urinary calculi: Secondary | ICD-10-CM | POA: Diagnosis not present

## 2016-01-10 DIAGNOSIS — M5136 Other intervertebral disc degeneration, lumbar region: Secondary | ICD-10-CM | POA: Diagnosis not present

## 2016-01-10 DIAGNOSIS — E785 Hyperlipidemia, unspecified: Secondary | ICD-10-CM | POA: Insufficient documentation

## 2016-01-10 DIAGNOSIS — Z96651 Presence of right artificial knee joint: Secondary | ICD-10-CM | POA: Diagnosis not present

## 2016-01-10 DIAGNOSIS — M1990 Unspecified osteoarthritis, unspecified site: Secondary | ICD-10-CM | POA: Diagnosis not present

## 2016-01-10 DIAGNOSIS — Z9851 Tubal ligation status: Secondary | ICD-10-CM | POA: Diagnosis not present

## 2016-01-10 DIAGNOSIS — Z8619 Personal history of other infectious and parasitic diseases: Secondary | ICD-10-CM | POA: Insufficient documentation

## 2016-01-10 DIAGNOSIS — I10 Essential (primary) hypertension: Secondary | ICD-10-CM | POA: Diagnosis not present

## 2016-01-10 HISTORY — DX: Malignant neoplasm of endometrium: C54.1

## 2016-01-10 LAB — BASIC METABOLIC PANEL
Anion Gap: 8 mEq/L (ref 3–11)
BUN: 19.3 mg/dL (ref 7.0–26.0)
CO2: 30 mEq/L — ABNORMAL HIGH (ref 22–29)
Calcium: 9.8 mg/dL (ref 8.4–10.4)
Chloride: 102 mEq/L (ref 98–109)
Creatinine: 0.9 mg/dL (ref 0.6–1.1)
EGFR: 67 mL/min/{1.73_m2} — ABNORMAL LOW (ref >90)
Glucose: 102 mg/dl (ref 70–140)
Potassium: 4 mEq/L (ref 3.5–5.1)
Sodium: 141 mEq/L (ref 136–145)

## 2016-01-10 NOTE — Patient Instructions (Signed)
Plan to have a CT scan of the chest, abdomen, and pelvis on Friday, Sept 15 at [facility].  We will call you with the results.                Preparing for your Surgery  Plan for surgery on January 16, 2016 with Dr. Everitt Amber.  You will be scheduled for a robotic assisted total hysterectomy, bilateral salpingo-oophorectomy, sentinel lymph node mapping/biopsy.  You can expect an overnight stay in the hospital.  Pre-operative Testing -You will receive a phone call from presurgical testing at [facility] to arrange for a pre-operative testing appointment before your surgery.  This appointment normally occurs one to two weeks before your scheduled surgery.   -Bring your insurance card, copy of an advanced directive if applicable, medication list  -At that visit, you will be asked to sign a consent for a possible blood transfusion in case a transfusion becomes necessary during surgery.  The need for a blood transfusion is rare but having consent is a necessary part of your care.     -You should not be taking blood thinners or aspirin at least ten days prior to surgery unless instructed by your surgeon.  Day Before Surgery at [place] will be asked to take in a light diet the day before surgery.  Avoid carbonated beverages.  You will be advised to have nothing to eat or drink after midnight the evening before.     Eat a light diet the day before surgery.  Examples including soups, broths, toast, yogurt, mashed potatoes.  Things to avoid include carbonated beverages (fizzy beverages), raw fruits and raw vegetables, or beans.    If your bowels are filled with gas, your surgeon will have difficulty visualizing your pelvic organs which increases your surgical risks.  Your role in recovery Your role is to become active as soon as directed by your doctor, while still giving yourself time to heal.  Rest when you feel tired. You will be asked to do the following in order to speed your  recovery:  - Cough and breathe deeply. This helps toclear and expand your lungs and can prevent pneumonia. You may be given a spirometer to practice deep breathing. A staff member will show you how to use the spirometer. - Do mild physical activity. Walking or moving your legs help your circulation and body functions return to normal. A staff member will help you when you try to walk and will provide you with simple exercises. Do not try to get up or walk alone the first time. - Actively manage your pain. Managing your pain lets you move in comfort. We will ask you to rate your pain on a scale of zero to 10. It is your responsibility to tell your doctor or nurse where and how much you hurt so your pain can be treated.  Special Considerations -If you are diabetic, you may be placed on insulin after surgery to have closer control over your blood sugars to promote healing and recovery.  This does not mean that you will be discharged on insulin.  If applicable, your oral antidiabetics will be resumed when you are tolerating a solid diet.  -Your final pathology results from surgery should be available by the Friday after surgery and the results will be relayed to you when available.   Blood Transfusion Information WHAT IS A BLOOD TRANSFUSION? A transfusion is the replacement of blood or some of its parts. Blood is made  up of multiple cells which provide different functions.  Red blood cells carry oxygen and are used for blood loss replacement.  White blood cells fight against infection.  Platelets control bleeding.  Plasma helps clot blood.  Other blood products are available for specialized needs, such as hemophilia or other clotting disorders. BEFORE THE TRANSFUSION  Who gives blood for transfusions?   You may be able to donate blood to be used at a later date on yourself (autologous donation).  Relatives can be asked to donate blood. This is generally not any safer than if you have  received blood from a stranger. The same precautions are taken to ensure safety when a relative's blood is donated.  Healthy volunteers who are fully evaluated to make sure their blood is safe. This is blood bank blood. Transfusion therapy is the safest it has ever been in the practice of medicine. Before blood is taken from a donor, a complete history is taken to make sure that person has no history of diseases nor engages in risky social behavior (examples are intravenous drug use or sexual activity with multiple partners). The donor's travel history is screened to minimize risk of transmitting infections, such as malaria. The donated blood is tested for signs of infectious diseases, such as HIV and hepatitis. The blood is then tested to be sure it is compatible with you in order to minimize the chance of a transfusion reaction. If you or a relative donates blood, this is often done in anticipation of surgery and is not appropriate for emergency situations. It takes many days to process the donated blood. RISKS AND COMPLICATIONS Although transfusion therapy is very safe and saves many lives, the main dangers of transfusion include:   Getting an infectious disease.  Developing a transfusion reaction. This is an allergic reaction to something in the blood you were given. Every precaution is taken to prevent this. The decision to have a blood transfusion has been considered carefully by your caregiver before blood is given. Blood is not given unless the benefits outweigh the risks.

## 2016-01-10 NOTE — Progress Notes (Signed)
Consult Note: Gyn-Onc  Kimberly Larsen 70 y.o. female  CC: No chief complaint on file.   HPI: Patient is seen today in consultation at the request of Dr. Pamala Hurry.   Patient is a very pleasant 70 year old gravida 1 para 1 who has a long history of renal stones and has been seen by urology. She presented to urology and was seen by Dr. Alinda Money on August 30 for complaints of gross hematuria and right lower quadrant pain. However at that time she was noted to have vaginal bleeding and her bladder catheterization revealed no blood within the bladder. She was seen by Dr. Valentino Saxon on 12/29/2015. At that time an endometrial biopsy was performed as well as a Pap smear. Pap smear was unremarkable. 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 Prior to this episode of bleeding she had not had any menses for approximately 16 years.  She's overall doing quite well. She did have some low back pain associated with this bleeding like if she were having a menstrual cycle. After hearing the results of the biopsy she's had some slight nausea but no emesis. She's been taking ginger root at night which is helping with that. She is quite anxious. She does complain of shortness of breath but it seems to more anxiety related. She can go up a flight of stairs without difficulty. She has difficulty going downstairs that she fell once going down stairs and is now fearful. She cannot walk long distances secondary to pain in her legs from her degenerative joint disease. She is up-to-date on her mammograms having had one in March 2017. Should a colonoscopy in May 2017 that revealed one polyp. It was benign. Is recommended that she have follow-up in 5 years. She has no significant family history for cancer. She's comes accompanied by a friend.  Review of Systems  Constitutional:  Denies fever. Skin: No rash Cardiovascular: No chest pain, shortness of breath except as above, or edema  Pulmonary: No cough   Gastro Intestinal: Slight nausea, vomiting, constipation, or diarrhea reported. No change in bowel movement.  Genitourinary: No frequency, urgency, or dysuria.  + vaginal bleeding, no discharge.  Musculoskeletal: + Low back pain and joint pain in her knees.  Neurologic: No weakness, numbness, or change in gait.  Psychology: Anxious  Current Meds:  Outpatient Encounter Prescriptions as of 01/10/2016  Medication Sig  . acidophilus (RISAQUAD) CAPS capsule Take 1 capsule by mouth daily.  Marland Kitchen amLODipine (NORVASC) 10 MG tablet Take 1 tablet (10 mg total) by mouth daily.  Marland Kitchen amoxicillin-clavulanate (AUGMENTIN) 875-125 MG per tablet Take 1 tablet by mouth every 12 (twelve) hours. (Patient not taking: Reported on 05/19/2014)  . aspirin EC 81 MG tablet Take 81 mg by mouth daily.  . B Complex-C (SUPER B COMPLEX) TABS Take 1 tablet by mouth daily.  . Biotin 5000 MCG TABS Take 2 tablets by mouth daily.  . DULoxetine (CYMBALTA) 30 MG capsule   . DULoxetine (CYMBALTA) 60 MG capsule Take 60 mg by mouth daily.  . fluconazole (DIFLUCAN) 100 MG tablet   . fluticasone (FLONASE) 50 MCG/ACT nasal spray Place 1 spray into both nostrils daily.  Marland Kitchen gabapentin (NEURONTIN) 600 MG tablet Take 600 mg by mouth 3 (three) times daily.  . hydrochlorothiazide (MICROZIDE) 12.5 MG capsule Take 2 capsules (25 mg total) by mouth daily. (Patient taking differently: Take 12.5 mg by mouth daily. )  . HYDROcodone-acetaminophen (NORCO/VICODIN) 5-325 MG per tablet Take 1-2 tablets by mouth every 6 (six) hours as  needed. (Patient not taking: Reported on 05/19/2014)  . levofloxacin (LEVAQUIN) 500 MG tablet Take 1 tablet (500 mg total) by mouth daily. (Patient not taking: Reported on 05/19/2014)  . Melatonin 10 MG CAPS Take 10 mg by mouth at bedtime.  . meloxicam (MOBIC) 15 MG tablet Take 1 tablet (15 mg total) by mouth daily.  . methocarbamol (ROBAXIN) 500 MG tablet Take 1 tablet (500 mg total) by mouth 3 (three) times daily as needed for  muscle spasms. (Patient not taking: Reported on 05/19/2014)  . metoprolol succinate (TOPROL-XL) 25 MG 24 hr tablet Take 25 mg by mouth daily.  . Multiple Vitamin (MULTIVITAMIN WITH MINERALS) TABS Take 1 tablet by mouth daily.  . NUCYNTA 50 MG TABS tablet   . Omega-3 Fatty Acids (FISH OIL) 600 MG CAPS Take 1 capsule by mouth 2 (two) times daily.  . ondansetron (ZOFRAN) 4 MG tablet Take 1 tablet (4 mg total) by mouth every 8 (eight) hours as needed for nausea or vomiting. (Patient not taking: Reported on 05/19/2014)  . oxyCODONE-acetaminophen (PERCOCET) 10-325 MG per tablet Take 1 tablet by mouth every 6 (six) hours as needed for pain. (Patient not taking: Reported on 07/07/2014)  . potassium chloride 20 MEQ TBCR Take 20 mEq by mouth daily. (Patient not taking: Reported on 05/19/2014)  . pravastatin (PRAVACHOL) 40 MG tablet Take 1 tablet (40 mg total) by mouth at bedtime.  . sulfamethoxazole-trimethoprim (BACTRIM DS,SEPTRA DS) 800-160 MG per tablet   . traMADol (ULTRAM) 50 MG tablet Take 100 mg by mouth 3 (three) times daily.  . traZODone (DESYREL) 100 MG tablet Take 100 mg by mouth at bedtime.  . vitamin C (ASCORBIC ACID) 500 MG tablet Take 500 mg by mouth daily.  . Zinc 50 MG CAPS Take 1 capsule (50 mg total) by mouth daily.   No facility-administered encounter medications on file as of 01/10/2016.     Allergy: No Known Allergies  Social Hx:   Social History   Social History  . Marital status: Divorced    Spouse name: N/A  . Number of children: N/A  . Years of education: N/A   Occupational History  . Not on file.   Social History Main Topics  . Smoking status: Never Smoker  . Smokeless tobacco: Not on file  . Alcohol use No  . Drug use: No  . Sexual activity: Yes   Other Topics Concern  . Not on file   Social History Narrative  . No narrative on file    Past Surgical Hx:  Past Surgical History:  Procedure Laterality Date  . BACK SURGERY  2013  . CARPAL TUNNEL RELEASE      R hand  . CYSTOSCOPY WITH URETEROSCOPY AND STENT PLACEMENT Bilateral 03/25/2014   Procedure: CYSTOSCOPY WITH URETEROSCOPY AND STENT PLACEMENT;  Surgeon: Raynelle Bring, MD;  Location: [facility];  Service: Urology;  Laterality: Bilateral;  . CYSTOSCOPY WITH URETEROSCOPY AND STENT PLACEMENT Bilateral 04/18/2014   Procedure: CYSTOSCOPY WITH URETEROSCOPY AND STENT PLACEMENT,RETROGRADE;  Surgeon: Raynelle Bring, MD;  Location: [facility];  Service: Urology;  Laterality: Bilateral;  . CYSTOSCOPY WITH URETEROSCOPY AND STENT PLACEMENT Right 05/30/2014   Procedure: CYSTOSCOPY WITH URETEROSCOPY AND STENT PLACEMENT;  Surgeon: Raynelle Bring, MD;  Location: [facility];  Service: Urology;  Laterality: Right;  . CYSTOSCOPY/RETROGRADE/URETEROSCOPY Left 05/30/2014   Procedure: LEFT RETROGRADE;  Surgeon: Raynelle Bring, MD;  Location: [facility];  Service: Urology;  Laterality: Left;  . GANGLION CYST EXCISION     L ankle  .  HOLMIUM LASER APPLICATION Bilateral [id number]   Procedure: HOLMIUM LASER APPLICATION;  Surgeon: Raynelle Bring, MD;  Location: [facility];  Service: Urology;  Laterality: Bilateral;  . JOINT REPLACEMENT     total knee  . LITHOTRIPSY    . LUMBAR FUSION  09/2011  . RHINOPLASTY    . SPINAL CORD STIMULATOR INSERTION N/A 02/23/2014   Procedure: SPINAL CORD STIMULATOR PLACEMENT ;  Surgeon: Melina Schools, MD;  Location: [place];  Service: Orthopedics;  Laterality: N/A;  . TONSILLECTOMY    . TOTAL KNEE ARTHROPLASTY Right 08/31/2012   Procedure: RIGHT TOTAL KNEE ARTHROPLASTY;  Surgeon: Mauri Pole, MD;  Location: [facility];  Service: Orthopedics;  Laterality: Right;  with LMA  . TUBAL LIGATION      Past Medical Hx:  Past Medical History:  Diagnosis Date  . Anxiety   . Arthritis   . Back pain   . DDD (degenerative disc disease), lumbar   . Depression   . History of kidney stones   . HLD (hyperlipidemia)   . Hx of sepsis 02/2014   DUE TO MULTIPLE KIDNEY STONES  . Hypertension   . Osteoporosis     Oncology Hx:     Endometrial cancer ([place])   12/29/2015 Initial Diagnosis    Endometrial cancer ([place])       Family Hx: No family history on file.  Vitals:  There were no vitals taken for this visit.  Physical Exam: Well-nourished well-developed female in no acute distress.   Neck: Supple, no lymphadenopathy, no thyromegaly.  Lungs: Clear to auscultation.  Cardiac: Regular rate and rhythm.  Abdomen: Obese, soft, nontender, nondistended. No palpable masses. There is no rebound, no guarding. No fluid wave. There is no hepatosplenomegaly.  Groins: No lymphadenopathy  Extremities: No edema  Pelvic: Normal female genitalia. The vagina is atrophic. The cervix is visualized. It is multiparous. Slight thin brown discharge. No gross visible lesions. No active bleeding. Bimanual examination the cervix is palpably normal. The uterus is of normal size shape and consistency there are no adnexal masses. Rectal confirms. There is no nodularity.  Assessment/Plan:  70 year old with a clinical stage I uterine serous carcinoma. There are no obvious contraindications for surgery. We would recommend definitive surgery with robotic hysterectomy, bilateral salpingo-oophorectomy and sentinel lymph node procedure. We will obtain a CT scan of the abdomen and pelvis to rule out metastatic disease prior to her surgery.  We have tentatively scheduled her surgery for September 19 of Dr. Everitt Amber. She understands that she'll have the opportunity to meet Dr. Denman George the morning of surgery.  Risks of surgery including bleeding, infection, injury to surrounding organs, thromboembolic disease, and infection were discussed with the patient. We discussed relative sentinel lymph node assessment and the excellent sensitivity of this procedure and the benefits with regards to operative time and decrease risk of lymphedema. The friend that is with her had sentinel lymph nodes for her breast cancer and confirmed what we discussed today  regarding being sentinel lymph node removal and that made the patient feel very reassured.  She and I also discussed that in many scenarios even in women who have disease confined to the uterus we would recommend adjuvant. This would be secondary to the histology of her tumor and not necessarily related to this stage. Adjuvant therapy could include chemotherapy as well as radiation.  Her questions were elicited in answer to her satisfaction. We will call her with the results of her CT scan.  Ithzel Fedorchak A., MD 01/10/2016, 10:28  AM

## 2016-01-11 ENCOUNTER — Encounter: Payer: Self-pay | Admitting: Gynecologic Oncology

## 2016-01-12 ENCOUNTER — Encounter (HOSPITAL_COMMUNITY)
Admission: RE | Admit: 2016-01-12 | Discharge: 2016-01-12 | Disposition: A | Discharge status: Home / Self Care | Payer: Medicare Other | Source: Ambulatory Visit | Attending: Gynecologic Oncology | Admitting: Gynecologic Oncology

## 2016-01-12 ENCOUNTER — Ambulatory Visit (HOSPITAL_COMMUNITY)
Admission: RE | Admit: 2016-01-12 | Discharge: 2016-01-12 | Disposition: A | Discharge status: Home / Self Care | Payer: Medicare Other | Source: Ambulatory Visit | Attending: Gynecologic Oncology | Admitting: Gynecologic Oncology

## 2016-01-12 ENCOUNTER — Encounter (HOSPITAL_COMMUNITY): Payer: Self-pay

## 2016-01-12 ENCOUNTER — Ambulatory Visit (HOSPITAL_COMMUNITY): Payer: Medicare Other

## 2016-01-12 DIAGNOSIS — I700 Atherosclerosis of aorta: Secondary | ICD-10-CM | POA: Diagnosis not present

## 2016-01-12 DIAGNOSIS — N132 Hydronephrosis with renal and ureteral calculous obstruction: Secondary | ICD-10-CM | POA: Insufficient documentation

## 2016-01-12 DIAGNOSIS — R938 Abnormal findings on diagnostic imaging of other specified body structures: Secondary | ICD-10-CM | POA: Diagnosis not present

## 2016-01-12 DIAGNOSIS — C763 Malignant neoplasm of pelvis: Secondary | ICD-10-CM | POA: Insufficient documentation

## 2016-01-12 DIAGNOSIS — C541 Malignant neoplasm of endometrium: Secondary | ICD-10-CM | POA: Diagnosis not present

## 2016-01-12 HISTORY — DX: Malignant (primary) neoplasm, unspecified: C80.1

## 2016-01-12 HISTORY — DX: Pneumonia, unspecified organism: J18.9

## 2016-01-12 HISTORY — DX: Gastroparesis: K31.84

## 2016-01-12 LAB — URINALYSIS, ROUTINE W REFLEX MICROSCOPIC
Bilirubin Urine: NEGATIVE
Glucose, UA: NEGATIVE mg/dL
Hgb urine dipstick: NEGATIVE
Ketones, ur: NEGATIVE mg/dL
Nitrite: NEGATIVE
Protein, ur: NEGATIVE mg/dL
Specific Gravity, Urine: 1.02 (ref 1.005–1.030)
pH: 7 (ref 5.0–8.0)

## 2016-01-12 LAB — COMPREHENSIVE METABOLIC PANEL
ALT: 18 U/L (ref 14–54)
AST: 21 U/L (ref 15–41)
Albumin: 3.7 g/dL (ref 3.5–5.0)
Alkaline Phosphatase: 86 U/L (ref 38–126)
Anion gap: 7 (ref 5–15)
BUN: 19 mg/dL (ref 6–20)
CO2: 32 mmol/L (ref 22–32)
Calcium: 9.9 mg/dL (ref 8.9–10.3)
Chloride: 103 mmol/L (ref 101–111)
Creatinine, Ser: 0.89 mg/dL (ref 0.44–1.00)
GFR calc Af Amer: >60 mL/min (ref >60)
GFR calc non Af Amer: >60 mL/min (ref >60)
Glucose, Bld: 91 mg/dL (ref 65–99)
Potassium: 3.9 mmol/L (ref 3.5–5.1)
Sodium: 142 mmol/L (ref 135–145)
Total Bilirubin: 0.5 mg/dL (ref 0.3–1.2)
Total Protein: 6.8 g/dL (ref 6.5–8.1)

## 2016-01-12 LAB — CBC WITH DIFFERENTIAL/PLATELET
Basophils Absolute: 0.1 10*3/uL (ref 0.0–0.1)
Basophils Relative: 1 %
Eosinophils Absolute: 0.3 10*3/uL (ref 0.0–0.7)
Eosinophils Relative: 3 %
HCT: 37.9 % (ref 36.0–46.0)
Hemoglobin: 12.3 g/dL (ref 12.0–15.0)
Lymphocytes Relative: 17 %
Lymphs Abs: 1.6 10*3/uL (ref 0.7–4.0)
MCH: 29 pg (ref 26.0–34.0)
MCHC: 32.5 g/dL (ref 30.0–36.0)
MCV: 89.4 fL (ref 78.0–100.0)
Monocytes Absolute: 0.9 10*3/uL (ref 0.1–1.0)
Monocytes Relative: 10 %
Neutro Abs: 6.6 10*3/uL (ref 1.7–7.7)
Neutrophils Relative %: 69 %
Platelets: 314 10*3/uL (ref 150–400)
RBC: 4.24 MIL/uL (ref 3.87–5.11)
RDW: 14.3 % (ref 11.5–15.5)
WBC: 9.5 10*3/uL (ref 4.0–10.5)

## 2016-01-12 LAB — URINE MICROSCOPIC-ADD ON

## 2016-01-12 MED ORDER — IOPAMIDOL (ISOVUE-300) INJECTION 61%
100.0000 mL | Freq: Once | INTRAVENOUS | Status: AC | PRN
  Administered 2016-01-12: 100 mL via INTRAVENOUS

## 2016-01-12 NOTE — Patient Instructions (Addendum)
Kimberly Larsen  01/12/2016   Your procedure is scheduled on: 01/16/16  Report to [facility]  Entrance take [facility]  elevators to 3rd floor to  [place] at 8:45AM.  Call this number if you have problems the morning of surgery [phone]   Remember: ONLY 1 PERSON MAY GO WITH YOU TO SHORT STAY TO GET  READY MORNING OF [place].  Do not eat food or drink liquids :After Midnight.     Take these medicines the morning of surgery with A SIP OF WATER: Amlodipine (Norvasc), Cymbalta, Flonase, Gabapentin, Metoprolol-Succinate, Oxycodone-Acetaminophen if needed.                               You may not have any metal on your body including hair pins and              piercings  Do not wear jewelry, make-up, lotions, powders or perfumes, deodorant             Do not wear nail polish.  Do not shave  48 hours prior to surgery.              Men may shave face and neck.   Do not bring valuables to the hospital. Kimberly.  Contacts, dentures or bridgework may not be worn into surgery.  Leave suitcase in the car. After surgery it may be brought to your room.               Please read over the following fact sheets you were given: _____________________________________________________________________             [facility] - Preparing for Surgery Before surgery, you can play an important role.  Because skin is not sterile, your skin needs to be as free of germs as possible.  You can reduce the number of germs on your skin by washing with CHG (chlorahexidine gluconate) soap before surgery.  CHG is an antiseptic cleaner which kills germs and bonds with the skin to continue killing germs even after washing. Please DO NOT use if you have an allergy to CHG or antibacterial soaps.  If your skin becomes reddened/irritated stop using the CHG and inform your nurse when you arrive at Short Stay. Do not shave (including legs  and underarms) for at least 48 hours prior to the first CHG shower.  You may shave your face/neck. Please follow these instructions carefully:  1.  Shower with CHG Soap the night before surgery and the  morning of Surgery.  2.  If you choose to wash your hair, wash your hair first as usual with your  normal  shampoo.  3.  After you shampoo, rinse your hair and body thoroughly to remove the  shampoo.                           4.  Use CHG as you would any other liquid soap.  You can apply chg directly  to the skin and wash                       Gently with a scrungie or clean washcloth.  5.  Apply  the CHG Soap to your body ONLY FROM THE NECK DOWN.   Do not use on face/ open                           Wound or open sores. Avoid contact with eyes, ears mouth and genitals (private parts).                       Wash face,  Genitals (private parts) with your normal soap.             6.  Wash thoroughly, paying special attention to the area where your surgery  will be performed.  7.  Thoroughly rinse your body with warm water from the neck down.  8.  DO NOT shower/wash with your normal soap after using and rinsing off  the CHG Soap.                9.  Pat yourself dry with a clean towel.            10.  Wear clean pajamas.            11.  Place clean sheets on your bed the night of your first shower and do not  sleep with pets. Day of Surgery : Do not apply any lotions/deodorants the morning of surgery.  Please wear clean clothes to the hospital/surgery center.  FAILURE TO FOLLOW THESE INSTRUCTIONS MAY RESULT IN THE CANCELLATION OF YOUR SURGERY PATIENT SIGNATURE_________________________________  NURSE SIGNATURE__________________________________  ________________________________________________________________________   Kimberly Larsen  An incentive spirometer is a tool that can help keep your lungs clear and active. This tool measures how well you are filling your lungs with each breath.  Taking long deep breaths may help reverse or decrease the chance of developing breathing (pulmonary) problems (especially infection) following:  A long period of time when you are unable to move or be active. BEFORE THE PROCEDURE   If the spirometer includes an indicator to show your best effort, your nurse or respiratory therapist will set it to a desired goal.  If possible, sit up straight or lean slightly forward. Try not to slouch.  Hold the incentive spirometer in an upright position. INSTRUCTIONS FOR USE  1. Sit on the edge of your bed if possible, or sit up as far as you can in bed or on a chair. 2. Hold the incentive spirometer in an upright position. 3. Breathe out normally. 4. Place the mouthpiece in your mouth and seal your lips tightly around it. 5. Breathe in slowly and as deeply as possible, raising the piston or the ball toward the top of the column. 6. Hold your breath for 3-5 seconds or for as long as possible. Allow the piston or ball to fall to the bottom of the column. 7. Remove the mouthpiece from your mouth and breathe out normally. 8. Rest for a few seconds and repeat Steps 1 through 7 at least 10 times every 1-2 hours when you are awake. Take your time and take a few normal breaths between deep breaths. 9. The spirometer may include an indicator to show your best effort. Use the indicator as a goal to work toward during each repetition. 10. After each set of 10 deep breaths, practice coughing to be sure your lungs are clear. If you have an incision (the cut made at the time of surgery), support your incision when coughing by placing a pillow or rolled  up towels firmly against it. Once you are able to get out of bed, walk around indoors and cough well. You may stop using the incentive spirometer when instructed by your caregiver.  RISKS AND COMPLICATIONS  Take your time so you do not get dizzy or light-headed.  If you are in pain, you may need to take or ask for pain  medication before doing incentive spirometry. It is harder to take a deep breath if you are having pain. AFTER USE  Rest and breathe slowly and easily.  It can be helpful to keep track of a log of your progress. Your caregiver can provide you with a simple table to help with this. If you are using the spirometer at home, follow these instructions: [place] IF:   You are having difficultly using the spirometer.  You have trouble using the spirometer as often as instructed.  Your pain medication is not giving enough relief while using the spirometer.  You develop fever of 100.5 F (38.1 C) or higher. SEEK IMMEDIATE MEDICAL CARE IF:   You cough up bloody sputum that had not been present before.  You develop fever of 102 F (38.9 C) or greater.  You develop worsening pain at or near the incision site. MAKE SURE YOU:   Understand these instructions.  Will watch your condition.  Will get help right away if you are not doing well or get worse. Document Released: 08/26/2006 Document Revised: 07/08/2011 Document Reviewed: 10/27/2006 ExitCare Patient Information 2014 ExitCare, [place].   ________________________________________________________________________  WHAT IS A BLOOD TRANSFUSION? Blood Transfusion Information  A transfusion is the replacement of blood or some of its parts. Blood is made up of multiple cells which provide different functions.  Red blood cells carry oxygen and are used for blood loss replacement.  White blood cells fight against infection.  Platelets control bleeding.  Plasma helps clot blood.  Other blood products are available for specialized needs, such as hemophilia or other clotting disorders. BEFORE THE TRANSFUSION  Who gives blood for transfusions?   Healthy volunteers who are fully evaluated to make sure their blood is safe. This is blood bank blood. Transfusion therapy is the safest it has ever been in the practice of medicine.  Before blood is taken from a donor, a complete history is taken to make sure that person has no history of diseases nor engages in risky social behavior (examples are intravenous drug use or sexual activity with multiple partners). The donor's travel history is screened to minimize risk of transmitting infections, such as malaria. The donated blood is tested for signs of infectious diseases, such as HIV and hepatitis. The blood is then tested to be sure it is compatible with you in order to minimize the chance of a transfusion reaction. If you or a relative donates blood, this is often done in anticipation of surgery and is not appropriate for emergency situations. It takes many days to process the donated blood. RISKS AND COMPLICATIONS Although transfusion therapy is very safe and saves many lives, the main dangers of transfusion include:   Getting an infectious disease.  Developing a transfusion reaction. This is an allergic reaction to something in the blood you were given. Every precaution is taken to prevent this. The decision to have a blood transfusion has been considered carefully by your caregiver before blood is given. Blood is not given unless the benefits outweigh the risks. AFTER THE TRANSFUSION  Right after receiving a blood transfusion, you will usually feel  much better and more energetic. This is especially true if your red blood cells have gotten low (anemic). The transfusion raises the level of the red blood cells which carry oxygen, and this usually causes an energy increase.  The nurse administering the transfusion will monitor you carefully for complications. HOME CARE INSTRUCTIONS  No special instructions are needed after a transfusion. You may find your energy is better. Speak with your caregiver about any limitations on activity for underlying diseases you may have. SEEK MEDICAL CARE IF:   Your condition is not improving after your transfusion.  You develop redness or  irritation at the intravenous (IV) site. SEEK IMMEDIATE MEDICAL CARE IF:  Any of the following symptoms occur over the next 12 hours:  Shaking chills.  You have a temperature by mouth above 102 F (38.9 C), not controlled by medicine.  Chest, back, or muscle pain.  People around you feel you are not acting correctly or are confused.  Shortness of breath or difficulty breathing.  Dizziness and fainting.  You get a rash or develop hives.  You have a decrease in urine output.  Your urine turns a dark color or changes to pink, red, or brown. Any of the following symptoms occur over the next 10 days:  You have a temperature by mouth above 102 F (38.9 C), not controlled by medicine.  Shortness of breath.  Weakness after normal activity.  The white part of the eye turns yellow (jaundice).  You have a decrease in the amount of urine or are urinating less often.  Your urine turns a dark color or changes to pink, red, or brown. Document Released: 04/12/2000 Document Revised: 07/08/2011 Document Reviewed: 11/30/2007 [facility] Patient Information 2014 [place], [place].  _______________________________________________________________________

## 2016-01-15 ENCOUNTER — Telehealth: Payer: Self-pay | Admitting: Gynecologic Oncology

## 2016-01-15 NOTE — Telephone Encounter (Signed)
Left message for patient to call the office to discuss CT scan.

## 2016-01-15 NOTE — Telephone Encounter (Signed)
Spoke with patient about CT scan results.  Discussed CT in detail.  Patient stating she did not know she had a pulmonary nodule seen on scan in 2015.  All questions answered.  Advised we would send results to Dr. Alinda Money, her urologist, to make him aware of the kidney stones.  Her next appt with Dr. Alinda Money is May 15, 2016.  Will also forward CT results to patient's PCP, Dr. Burnett Sheng, since follow up will be necessary for the thyroid nodule noted on CT.  Informed we would obtain a CT of the chest in 6-12 months per the recommendations of the radiologist to follow the nodule in the left lobe.  Patient advised to call for any needs.  CT to be released in [place].  Surgery is planned for tomorrow.

## 2016-01-16 ENCOUNTER — Encounter (HOSPITAL_COMMUNITY)
Admission: RE | Disposition: A | Discharge status: Home / Self Care | Payer: Self-pay | Source: Ambulatory Visit | Attending: Gynecologic Oncology

## 2016-01-16 ENCOUNTER — Ambulatory Visit (HOSPITAL_COMMUNITY): Payer: Medicare Other | Admitting: Certified Registered Nurse Anesthetist

## 2016-01-16 ENCOUNTER — Encounter (HOSPITAL_COMMUNITY): Payer: Self-pay

## 2016-01-16 ENCOUNTER — Ambulatory Visit (HOSPITAL_COMMUNITY)
Admission: RE | Admit: 2016-01-16 | Discharge: 2016-01-17 | Disposition: A | Discharge status: Home / Self Care | Payer: Medicare Other | Source: Ambulatory Visit | Attending: Gynecologic Oncology | Admitting: Gynecologic Oncology

## 2016-01-16 DIAGNOSIS — C541 Malignant neoplasm of endometrium: Secondary | ICD-10-CM | POA: Diagnosis not present

## 2016-01-16 DIAGNOSIS — Z79899 Other long term (current) drug therapy: Secondary | ICD-10-CM | POA: Diagnosis not present

## 2016-01-16 DIAGNOSIS — Z7982 Long term (current) use of aspirin: Secondary | ICD-10-CM | POA: Diagnosis not present

## 2016-01-16 DIAGNOSIS — Z791 Long term (current) use of non-steroidal anti-inflammatories (NSAID): Secondary | ICD-10-CM | POA: Diagnosis not present

## 2016-01-16 DIAGNOSIS — Z79891 Long term (current) use of opiate analgesic: Secondary | ICD-10-CM | POA: Diagnosis not present

## 2016-01-16 DIAGNOSIS — D259 Leiomyoma of uterus, unspecified: Secondary | ICD-10-CM | POA: Diagnosis not present

## 2016-01-16 DIAGNOSIS — M1990 Unspecified osteoarthritis, unspecified site: Secondary | ICD-10-CM | POA: Diagnosis not present

## 2016-01-16 DIAGNOSIS — I129 Hypertensive chronic kidney disease with stage 1 through stage 4 chronic kidney disease, or unspecified chronic kidney disease: Secondary | ICD-10-CM | POA: Diagnosis not present

## 2016-01-16 DIAGNOSIS — N83202 Unspecified ovarian cyst, left side: Secondary | ICD-10-CM | POA: Diagnosis not present

## 2016-01-16 DIAGNOSIS — I10 Essential (primary) hypertension: Secondary | ICD-10-CM | POA: Insufficient documentation

## 2016-01-16 DIAGNOSIS — N83201 Unspecified ovarian cyst, right side: Secondary | ICD-10-CM | POA: Diagnosis not present

## 2016-01-16 DIAGNOSIS — E785 Hyperlipidemia, unspecified: Secondary | ICD-10-CM | POA: Insufficient documentation

## 2016-01-16 DIAGNOSIS — F329 Major depressive disorder, single episode, unspecified: Secondary | ICD-10-CM | POA: Insufficient documentation

## 2016-01-16 DIAGNOSIS — Z96651 Presence of right artificial knee joint: Secondary | ICD-10-CM | POA: Diagnosis not present

## 2016-01-16 DIAGNOSIS — D649 Anemia, unspecified: Secondary | ICD-10-CM | POA: Insufficient documentation

## 2016-01-16 DIAGNOSIS — C55 Malignant neoplasm of uterus, part unspecified: Secondary | ICD-10-CM | POA: Diagnosis present

## 2016-01-16 DIAGNOSIS — N841 Polyp of cervix uteri: Secondary | ICD-10-CM | POA: Diagnosis not present

## 2016-01-16 DIAGNOSIS — Z7951 Long term (current) use of inhaled steroids: Secondary | ICD-10-CM | POA: Insufficient documentation

## 2016-01-16 HISTORY — PX: ROBOTIC ASSISTED TOTAL HYSTERECTOMY WITH BILATERAL SALPINGO OOPHERECTOMY: SHX6086

## 2016-01-16 LAB — TYPE AND SCREEN
ABO/RH(D): O POS
Antibody Screen: NEGATIVE

## 2016-01-16 SURGERY — HYSTERECTOMY, TOTAL, ROBOT-ASSISTED, LAPAROSCOPIC, WITH BILATERAL SALPINGO-OOPHORECTOMY
Anesthesia: General | Laterality: Bilateral

## 2016-01-16 MED ORDER — TRAZODONE HCL 50 MG PO TABS
100.0000 mg | ORAL_TABLET | Freq: Every day | ORAL | Status: DC
  Administered 2016-01-16: 100 mg via ORAL
  Filled 2016-01-16: qty 2

## 2016-01-16 MED ORDER — OXYCODONE-ACETAMINOPHEN 5-325 MG PO TABS
1.0000 | ORAL_TABLET | ORAL | Status: DC | PRN
  Administered 2016-01-16 – 2016-01-17 (×2): 2 via ORAL
  Filled 2016-01-16 (×2): qty 2

## 2016-01-16 MED ORDER — DEXAMETHASONE SODIUM PHOSPHATE 10 MG/ML IJ SOLN
INTRAMUSCULAR | Status: AC
  Filled 2016-01-16: qty 1

## 2016-01-16 MED ORDER — CEFAZOLIN SODIUM-DEXTROSE 2-4 GM/100ML-% IV SOLN
2.0000 g | INTRAVENOUS | Status: AC
  Administered 2016-01-16: 2 g via INTRAVENOUS
  Filled 2016-01-16: qty 100

## 2016-01-16 MED ORDER — METOPROLOL SUCCINATE ER 25 MG PO TB24
25.0000 mg | ORAL_TABLET | Freq: Every day | ORAL | Status: DC
  Administered 2016-01-17: 25 mg via ORAL
  Filled 2016-01-16: qty 1

## 2016-01-16 MED ORDER — ACETAMINOPHEN 160 MG/5ML PO SOLN
325.0000 mg | ORAL | Status: DC | PRN
Start: 2016-01-16 — End: 2016-01-16

## 2016-01-16 MED ORDER — ACETAMINOPHEN 325 MG PO TABS: 325.0000 mg | ORAL_TABLET | ORAL | Status: DC | PRN

## 2016-01-16 MED ORDER — ENOXAPARIN SODIUM 40 MG/0.4ML ~~LOC~~ SOLN
40.0000 mg | SUBCUTANEOUS | Status: DC
  Administered 2016-01-17: 40 mg via SUBCUTANEOUS
  Filled 2016-01-16: qty 0.4

## 2016-01-16 MED ORDER — ROCURONIUM BROMIDE 10 MG/ML (PF) SYRINGE
PREFILLED_SYRINGE | INTRAVENOUS | Status: AC
  Filled 2016-01-16: qty 10

## 2016-01-16 MED ORDER — GLYCOPYRROLATE 0.2 MG/ML IV SOSY
PREFILLED_SYRINGE | INTRAVENOUS | Status: AC
  Filled 2016-01-16: qty 3

## 2016-01-16 MED ORDER — ONDANSETRON HCL 4 MG PO TABS: 4.0000 mg | ORAL_TABLET | Freq: Four times a day (QID) | ORAL | Status: DC | PRN

## 2016-01-16 MED ORDER — BOOST / RESOURCE BREEZE PO LIQD
1.0000 | Freq: Three times a day (TID) | ORAL | Status: DC
  Administered 2016-01-17: 1 via ORAL

## 2016-01-16 MED ORDER — KETAMINE HCL 10 MG/ML IJ SOLN
INTRAMUSCULAR | Status: DC | PRN
  Administered 2016-01-16: 20 mg via INTRAVENOUS

## 2016-01-16 MED ORDER — ROCURONIUM BROMIDE 10 MG/ML (PF) SYRINGE
PREFILLED_SYRINGE | INTRAVENOUS | Status: DC | PRN
  Administered 2016-01-16: 10 mg via INTRAVENOUS
  Administered 2016-01-16: 50 mg via INTRAVENOUS
  Administered 2016-01-16: 10 mg via INTRAVENOUS

## 2016-01-16 MED ORDER — LIDOCAINE 2% (20 MG/ML) 5 ML SYRINGE
INTRAMUSCULAR | Status: DC | PRN
  Administered 2016-01-16: 50 mg via INTRAVENOUS

## 2016-01-16 MED ORDER — FENTANYL CITRATE (PF) 100 MCG/2ML IJ SOLN
INTRAMUSCULAR | Status: AC
  Filled 2016-01-16: qty 4

## 2016-01-16 MED ORDER — LIDOCAINE 2% (20 MG/ML) 5 ML SYRINGE
INTRAMUSCULAR | Status: AC
  Filled 2016-01-16: qty 5

## 2016-01-16 MED ORDER — STERILE WATER FOR INJECTION IJ SOLN
INTRAMUSCULAR | Status: DC | PRN
  Administered 2016-01-16: 4 mL via VAGINAL

## 2016-01-16 MED ORDER — HYDROMORPHONE HCL 1 MG/ML IJ SOLN
0.2500 mg | INTRAMUSCULAR | Status: DC | PRN
  Administered 2016-01-16 (×2): 0.5 mg via INTRAVENOUS

## 2016-01-16 MED ORDER — OXYCODONE HCL 5 MG/5ML PO SOLN: 5.0000 mg | Freq: Once | ORAL | Status: DC | PRN

## 2016-01-16 MED ORDER — MIDAZOLAM HCL 2 MG/2ML IJ SOLN
INTRAMUSCULAR | Status: AC
  Filled 2016-01-16: qty 2

## 2016-01-16 MED ORDER — IBUPROFEN 800 MG PO TABS: 800.0000 mg | ORAL_TABLET | Freq: Three times a day (TID) | ORAL | Status: DC | PRN

## 2016-01-16 MED ORDER — ONDANSETRON HCL 4 MG/2ML IJ SOLN
INTRAMUSCULAR | Status: DC | PRN
  Administered 2016-01-16: 4 mg via INTRAVENOUS

## 2016-01-16 MED ORDER — ENOXAPARIN SODIUM 40 MG/0.4ML ~~LOC~~ SOLN
40.0000 mg | SUBCUTANEOUS | Status: AC
  Administered 2016-01-16: 40 mg via SUBCUTANEOUS
  Filled 2016-01-16: qty 0.4

## 2016-01-16 MED ORDER — PRAVASTATIN SODIUM 20 MG PO TABS
40.0000 mg | ORAL_TABLET | Freq: Every day | ORAL | Status: DC
  Administered 2016-01-16: 40 mg via ORAL
  Filled 2016-01-16: qty 2

## 2016-01-16 MED ORDER — METOCLOPRAMIDE HCL 5 MG/ML IJ SOLN
INTRAMUSCULAR | Status: DC | PRN
  Administered 2016-01-16: 10 mg via INTRAVENOUS

## 2016-01-16 MED ORDER — KCL IN DEXTROSE-NACL 20-5-0.45 MEQ/L-%-% IV SOLN
INTRAVENOUS | Status: DC
  Administered 2016-01-16 (×2): via INTRAVENOUS
  Filled 2016-01-16: qty 1000

## 2016-01-16 MED ORDER — ONDANSETRON HCL 4 MG/2ML IJ SOLN: 4.0000 mg | Freq: Four times a day (QID) | INTRAMUSCULAR | Status: DC | PRN

## 2016-01-16 MED ORDER — DULOXETINE HCL 60 MG PO CPEP
60.0000 mg | ORAL_CAPSULE | Freq: Every day | ORAL | Status: DC
  Administered 2016-01-17: 60 mg via ORAL
  Filled 2016-01-16: qty 1

## 2016-01-16 MED ORDER — TRAMADOL HCL 50 MG PO TABS: 100.0000 mg | ORAL_TABLET | Freq: Two times a day (BID) | ORAL | Status: DC | PRN

## 2016-01-16 MED ORDER — OXYCODONE HCL 5 MG PO TABS: 5.0000 mg | ORAL_TABLET | Freq: Once | ORAL | Status: DC | PRN

## 2016-01-16 MED ORDER — ONDANSETRON HCL 4 MG/2ML IJ SOLN
INTRAMUSCULAR | Status: AC
  Filled 2016-01-16: qty 2

## 2016-01-16 MED ORDER — LACTATED RINGERS IV SOLN
INTRAVENOUS | Status: DC
  Administered 2016-01-16: 1000 mL via INTRAVENOUS
  Administered 2016-01-16 (×2): via INTRAVENOUS

## 2016-01-16 MED ORDER — CEFAZOLIN SODIUM-DEXTROSE 2-4 GM/100ML-% IV SOLN
INTRAVENOUS | Status: AC
  Filled 2016-01-16: qty 100

## 2016-01-16 MED ORDER — PROPOFOL 10 MG/ML IV BOLUS
INTRAVENOUS | Status: AC
  Filled 2016-01-16: qty 20

## 2016-01-16 MED ORDER — FLUTICASONE PROPIONATE 50 MCG/ACT NA SUSP
1.0000 | Freq: Every day | NASAL | Status: DC
  Filled 2016-01-16: qty 16

## 2016-01-16 MED ORDER — AMLODIPINE BESYLATE 10 MG PO TABS
10.0000 mg | ORAL_TABLET | Freq: Every day | ORAL | Status: DC
  Administered 2016-01-17: 10 mg via ORAL
  Filled 2016-01-16: qty 1

## 2016-01-16 MED ORDER — MIDAZOLAM HCL 5 MG/5ML IJ SOLN
INTRAMUSCULAR | Status: DC | PRN
  Administered 2016-01-16: 2 mg via INTRAVENOUS

## 2016-01-16 MED ORDER — PROPOFOL 10 MG/ML IV BOLUS
INTRAVENOUS | Status: DC | PRN
  Administered 2016-01-16: 150 mg via INTRAVENOUS

## 2016-01-16 MED ORDER — HYDROMORPHONE HCL 1 MG/ML IJ SOLN
0.2000 mg | INTRAMUSCULAR | Status: DC | PRN
  Administered 2016-01-16: 0.5 mg via INTRAVENOUS
  Filled 2016-01-16: qty 1

## 2016-01-16 MED ORDER — DEXAMETHASONE SODIUM PHOSPHATE 4 MG/ML IJ SOLN
INTRAMUSCULAR | Status: DC | PRN
  Administered 2016-01-16: 10 mg via INTRAVENOUS

## 2016-01-16 MED ORDER — SUGAMMADEX SODIUM 200 MG/2ML IV SOLN
INTRAVENOUS | Status: DC | PRN
  Administered 2016-01-16 (×2): 200 mg via INTRAVENOUS

## 2016-01-16 MED ORDER — METOCLOPRAMIDE HCL 5 MG/ML IJ SOLN
INTRAMUSCULAR | Status: AC
  Filled 2016-01-16: qty 2

## 2016-01-16 MED ORDER — GABAPENTIN 300 MG PO CAPS
600.0000 mg | ORAL_CAPSULE | Freq: Three times a day (TID) | ORAL | Status: DC
  Administered 2016-01-16 – 2016-01-17 (×2): 600 mg via ORAL
  Filled 2016-01-16 (×2): qty 2

## 2016-01-16 MED ORDER — LACTATED RINGERS IR SOLN
Status: DC | PRN
  Administered 2016-01-16: 100 mL

## 2016-01-16 MED ORDER — FENTANYL CITRATE (PF) 100 MCG/2ML IJ SOLN
INTRAMUSCULAR | Status: DC | PRN
  Administered 2016-01-16: 50 ug via INTRAVENOUS
  Administered 2016-01-16: 100 ug via INTRAVENOUS
  Administered 2016-01-16: 50 ug via INTRAVENOUS

## 2016-01-16 MED ORDER — GLYCOPYRROLATE 0.2 MG/ML IJ SOLN
INTRAMUSCULAR | Status: DC | PRN
  Administered 2016-01-16: 0.2 mg via INTRAVENOUS

## 2016-01-16 MED ORDER — HYDROMORPHONE HCL 1 MG/ML IJ SOLN
INTRAMUSCULAR | Status: AC
  Filled 2016-01-16: qty 1

## 2016-01-16 SURGICAL SUPPLY — 66 items
APL ESCP 34 STRL LF DISP (HEMOSTASIS) ×1
APPLICATOR SURGIFLO ENDO (HEMOSTASIS) ×1 IMPLANT
BAG LAPAROSCOPIC 12 15 PORT 16 (BASKET) IMPLANT
BAG RETRIEVAL 12/15 (BASKET)
BAG SPEC RTRVL LRG 6X4 10 (ENDOMECHANICALS) ×2
CHLORAPREP W/TINT 26ML (MISCELLANEOUS) ×2 IMPLANT
COVER SURGICAL LIGHT HANDLE (MISCELLANEOUS) ×2 IMPLANT
COVER TIP SHEARS 8 DVNC (MISCELLANEOUS) ×1 IMPLANT
COVER TIP SHEARS 8MM DA VINCI (MISCELLANEOUS) ×1
DRAPE ARM DVNC X/XI (DISPOSABLE) ×4 IMPLANT
DRAPE COLUMN DVNC XI (DISPOSABLE) ×1 IMPLANT
DRAPE DA VINCI XI ARM (DISPOSABLE) ×4
DRAPE DA VINCI XI COLUMN (DISPOSABLE) ×1
DRAPE SHEET LG 3/4 BI-LAMINATE (DRAPES) ×4 IMPLANT
DRAPE SURG IRRIG POUCH 19X23 (DRAPES) ×2 IMPLANT
DRSG TEGADERM 6X8 (GAUZE/BANDAGES/DRESSINGS) ×1 IMPLANT
ELECT REM PT RETURN 15FT ADLT (MISCELLANEOUS) ×2 IMPLANT
GLOVE BIO SURGEON STRL SZ 6 (GLOVE) ×8 IMPLANT
GLOVE BIO SURGEON STRL SZ 6.5 (GLOVE) ×4 IMPLANT
GLOVE BIOGEL PI IND STRL 6.5 (GLOVE) IMPLANT
GLOVE BIOGEL PI IND STRL 7.5 (GLOVE) IMPLANT
GLOVE BIOGEL PI INDICATOR 6.5 (GLOVE) ×1
GLOVE BIOGEL PI INDICATOR 7.5 (GLOVE) ×2
GLOVE ECLIPSE 7.0 STRL STRAW (GLOVE) ×1 IMPLANT
GLOVE SURG SS PI 6.5 STRL IVOR (GLOVE) ×1 IMPLANT
GOWN STRL REUS W/ TWL LRG LVL3 (GOWN DISPOSABLE) ×2 IMPLANT
GOWN STRL REUS W/TWL LRG LVL3 (GOWN DISPOSABLE) ×14
HOLDER FOLEY CATH W/STRAP (MISCELLANEOUS) ×2 IMPLANT
IRRIG SUCT STRYKERFLOW 2 WTIP (MISCELLANEOUS) ×2
IRRIGATION SUCT STRKRFLW 2 WTP (MISCELLANEOUS) ×1 IMPLANT
KIT BASIN OR (CUSTOM PROCEDURE TRAY) ×2 IMPLANT
KIT PROCEDURE DA VINCI SI (MISCELLANEOUS) ×1
KIT PROCEDURE DVNC SI (MISCELLANEOUS) IMPLANT
LIQUID BAND (GAUZE/BANDAGES/DRESSINGS) ×2 IMPLANT
MANIPULATOR UTERINE 4.5 ZUMI (MISCELLANEOUS) ×2 IMPLANT
MARKER SKIN DUAL TIP RULER LAB (MISCELLANEOUS) ×2 IMPLANT
NDL SAFETY ECLIPSE 18X1.5 (NEEDLE) ×1 IMPLANT
NDL SPNL 18GX3.5 QUINCKE PK (NEEDLE) ×1 IMPLANT
NEEDLE HYPO 18GX1.5 SHARP (NEEDLE) ×2
NEEDLE SPNL 18GX3.5 QUINCKE PK (NEEDLE) ×2 IMPLANT
OBTURATOR XI 8MM BLADELESS (TROCAR) ×2 IMPLANT
OCCLUDER COLPOPNEUMO (BALLOONS) ×2 IMPLANT
PAD POSITIONING PINK XL (MISCELLANEOUS) ×2 IMPLANT
PORT ACCESS TROCAR AIRSEAL 12 (TROCAR) ×1 IMPLANT
PORT ACCESS TROCAR AIRSEAL 5M (TROCAR) ×1
POUCH SPECIMEN RETRIEVAL 10MM (ENDOMECHANICALS) ×2 IMPLANT
SEAL CANN UNIV 5-8 DVNC XI (MISCELLANEOUS) ×4 IMPLANT
SEAL XI 5MM-8MM UNIVERSAL (MISCELLANEOUS) ×4
SET TRI-LUMEN FLTR TB AIRSEAL (TUBING) ×2 IMPLANT
SHEET LAVH (DRAPES) ×2 IMPLANT
SOLUTION ELECTROLUBE (MISCELLANEOUS) ×2 IMPLANT
SURGIFLO W/THROMBIN 8M KIT (HEMOSTASIS) ×1 IMPLANT
SUT MNCRL AB 4-0 PS2 18 (SUTURE) ×4 IMPLANT
SUT VIC AB 0 CT1 27 (SUTURE) ×2
SUT VIC AB 0 CT1 27XBRD ANTBC (SUTURE) ×1 IMPLANT
SYR 50ML LL SCALE MARK (SYRINGE) ×2 IMPLANT
SYRINGE 10CC LL (SYRINGE) ×2 IMPLANT
TOWEL OR 17X26 10 PK STRL BLUE (TOWEL DISPOSABLE) ×3 IMPLANT
TOWEL OR NON WOVEN STRL DISP B (DISPOSABLE) ×2 IMPLANT
TRAP SPECIMEN MUCOUS 40CC (MISCELLANEOUS) IMPLANT
TRAY LAPAROSCOPIC (CUSTOM PROCEDURE TRAY) ×2 IMPLANT
TROCAR BLADELESS OPT 5 100 (ENDOMECHANICALS) ×2 IMPLANT
UNDERPAD 30X30 (UNDERPADS AND DIAPERS) ×1 IMPLANT
UNDERPAD 30X30 INCONTINENT (UNDERPADS AND DIAPERS) ×2 IMPLANT
WATER STERILE IRR 1000ML POUR (IV SOLUTION) ×1 IMPLANT
WATER STERILE IRR 1500ML POUR (IV SOLUTION) ×2 IMPLANT

## 2016-01-16 NOTE — Anesthesia Preprocedure Evaluation (Addendum)
Anesthesia Evaluation  Patient identified by MRN, date of birth, ID band Patient awake    Reviewed: Allergy & Precautions, NPO status , Patient's Chart, lab work & pertinent test results  History of Anesthesia Complications Negative for: history of anesthetic complications  Airway Mallampati: II  TM Distance: >3 FB Neck ROM: Full    Dental  (+) Teeth Intact, Missing   Pulmonary neg pulmonary ROS,    breath sounds clear to auscultation       Cardiovascular hypertension, Pt. on medications  Rhythm:Regular     Neuro/Psych PSYCHIATRIC DISORDERS Anxiety Depression  Neuromuscular disease    GI/Hepatic   Endo/Other    Renal/GU Renal disease     Musculoskeletal  (+) Arthritis , Fibromyalgia -, narcotic dependent  Abdominal   Peds  Hematology  (+) anemia ,   Anesthesia Other Findings   Reproductive/Obstetrics                            Anesthesia Physical Anesthesia Plan  ASA: II  Anesthesia Plan: General   Post-op Pain Management:    Induction: Intravenous  Airway Management Planned: Oral ETT  Additional Equipment: None  Intra-op Plan:   Post-operative Plan: Extubation in OR  Informed Consent: I have reviewed the patients History and Physical, chart, labs and discussed the procedure including the risks, benefits and alternatives for the proposed anesthesia with the patient or authorized representative who has indicated his/her understanding and acceptance.   Dental advisory given  Plan Discussed with: CRNA and Surgeon  Anesthesia Plan Comments:         Anesthesia Quick Evaluation

## 2016-01-16 NOTE — Discharge Instructions (Signed)
01/16/2016  Return to work: 4-6 weeks if applicable  Activity: 1. Be up and out of the bed during the day.  Take a nap if needed.  You may walk up steps but be careful and use the hand rail.  Stair climbing will tire you more than you think, you may need to stop part way and rest.   2. No lifting or straining for 6 weeks.  3. No driving for 1 week(s).  Do not drive if you are taking narcotic pain medicine.  4. Shower daily.  Use soap and water on your incision and pat dry; don't rub.  No tub baths until cleared by your surgeon.   5. No sexual activity and nothing in the vagina for 8 weeks.  6. You may experience a small amount of clear drainage from your incisions, which is normal.  If the drainage persists or increases, please call the office.  7. You may also experience light vaginal spotting.  Call our office if the vaginal bleeding becomes heavy.  Diet: 1. Low sodium Heart Healthy Diet is recommended.  2. It is safe to use a laxative, such as Miralax or Colace, if you have difficulty moving your bowels.   Wound Care: 1. Keep clean and dry.  Shower daily.  Reasons to call the Doctor:  Fever - Oral temperature greater than 100.4 degrees Fahrenheit  Foul-smelling vaginal discharge  Difficulty urinating  Nausea and vomiting  Increased pain at the site of the incision that is unrelieved with pain medicine.  Difficulty breathing with or without chest pain  New calf pain especially if only on one side  Sudden, continuing increased vaginal bleeding with or without clots.   Contacts: For questions or concerns you should contact:  Dr. Everitt Amber at [phone]  Joylene John, NP at [phone]  After Hours: call [phone] and have the GYN Oncologist paged/contacted   Abdominal Hysterectomy, Care After These instructions give you information on caring for yourself after your procedure. Your doctor may also give you more specific instructions. Call your doctor if  you have any problems or questions after your procedure.  HOME CARE It takes 4-6 weeks to recover from this surgery. Follow all of your doctor's instructions.   Only take medicines as told by your doctor.  Take showers for 2-3 weeks. Ask your doctor when it is okay to shower.  Do not douche, use tampons, or have sex (intercourse) for at least 6 weeks or as told.  Follow your doctor's advice about exercise, lifting objects, driving, and general activities.  Get plenty of rest and sleep.  Do not lift anything heavier than a gallon of milk (about 10 pounds [4.5 kilograms]) for the first month after surgery.  Get back to your normal diet as told by your doctor.  Do not drink alcohol until your doctor says it is okay.  Take a medicine to help you poop (laxative) as told by your doctor.  Eating foods high in fiber may help you poop. Eat a lot of raw fruits and vegetables, whole grains, and beans.  Drink enough fluids to keep your pee (urine) clear or pale yellow.  Have someone help you at home for 1-2 weeks after your surgery.  Keep follow-up doctor visits as told. GET HELP IF:  You have chills or fever.  You have puffiness, redness, or pain in area of the cut (incision).  You have yellowish-white fluid (pus) coming from the cut.  You have a bad smell coming from the  cut or bandage.  Your cut pulls apart.  You feel dizzy or light-headed.  You have pain or bleeding when you pee.  You keep having watery poop (diarrhea).  You keep feeling sick to your stomach (nauseous) or keep throwing up (vomiting).  You have fluid (discharge) coming from your vagina.  You have a rash.  You have a reaction to your medicine.  You need stronger pain medicine. GET HELP RIGHT AWAY IF:   You have a fever and your symptoms suddenly get worse.  You have bad belly (abdominal) pain.  You have chest pain.  You are short of breath.  You pass out (faint).  You have pain, puffiness, or  redness of your leg.  You bleed a lot from your vagina and notice clumps of tissue (clots). MAKE SURE YOU:   Understand these instructions.  Will watch your condition.  Will get help right away if you are not doing well or get worse.   This information is not intended to replace advice given to you by your health care provider. Make sure you discuss any questions you have with your health care provider.   Document Released: 01/23/2008 Document Revised: 04/20/2013 Document Reviewed: 02/05/2013 Elsevier Interactive Patient Education [employer].

## 2016-01-16 NOTE — Op Note (Signed)
OPERATIVE NOTE 01/16/16  Surgeon: Donaciano Eva   Assistants: Dr Lahoma Crocker (an MD assistant was necessary for tissue manipulation, management of robotic instrumentation, retraction and positioning due to the complexity of the case and hospital policies).   Anesthesia: General endotracheal anesthesia  ASA Class: 3   Pre-operative Diagnosis: serous endometrial cancer  Post-operative Diagnosis: same  Operation: Robotic-assisted laparoscopic total hysterectomy with bilateral salpingoophorectomy, SLN mapping and bilateral pelvic lymphadenectomy  Surgeon: Donaciano Eva  Assistant Surgeon: Lahoma Crocker MD  Anesthesia: GET  Urine Output: 100  Operative Findings:  : 6cm uterus, filmy adhesions to sigmoid. Normal ovaries and tubes. No suspicious nodes. Failed mapping bilaterally.  Estimated Blood Loss:  less than 50 mL      Total IV Fluids: 500 ml         Specimens: uterus, cervix, bilateral tubes and ovaries, left and right pelvic lymph nodes         Complications:  None; patient tolerated the procedure well.         Disposition: PACU - hemodynamically stable.  Procedure Details  The patient was seen in the Holding Room. The risks, benefits, complications, treatment options, and expected outcomes were discussed with the patient.  The patient concurred with the proposed plan, giving informed consent.  The site of surgery properly noted/marked. The patient was identified as Kimberly Larsen and the procedure verified as a Robotic-assisted hysterectomy with bilateral salpingo oophorectomy and SLN biopsy. A Time Out was held and the above information confirmed.  After induction of anesthesia, the patient was draped and prepped in the usual sterile manner. Pt was placed in supine position after anesthesia and draped and prepped in the usual sterile manner. The abdominal drape was placed after the CholoraPrep had been allowed to dry for 3 minutes.  Her arms were  tucked to her side with all appropriate precautions.  The shoulders were stabilized with padded shoulder blocks applied to the acromium processes.  The patient was placed in the semi-lithotomy position in [place].  The perineum was prepped with Betadine. The patient was then prepped. Foley catheter was placed.  A sterile speculum was placed in the vagina.  The cervix was grasped with a single-tooth tenaculum and dilated with Kennon Rounds dilators.  1mg  total of ICG was injected into the cervical stroma at 2 and 9 o'clock at a 24mm depth (concentration 0..5mg /ml). The ZUMI uterine manipulator with a medium colpotomizer ring was placed without difficulty.  A pneum occluder balloon was placed over the manipulator.  OG tube placement was confirmed and to suction.   Next, a 5 mm skin incision was made 1 cm below the subcostal margin in the midclavicular line.  The 5 mm Optiview port and scope was used for direct entry.  Opening pressure was under 10 mm CO2.  The abdomen was insufflated and the findings were noted as above.   At this point and all points during the procedure, the patient's intra-abdominal pressure did not exceed 15 mmHg. Next, a 10 mm skin incision was made in the umbilicus and a right and left port was placed about 10 cm lateral to the robot port on the right and left side.  A fourth arm was placed in the left lower quadrant 2 cm above and superior and medial to the anterior superior iliac spine.  All ports were placed under direct visualization.  The patient was placed in steep Trendelenburg.  Bowel was folded away into the upper abdomen.  The robot was  docked in the normal manner.  Upon activation of the near infrared imaging, it was apparent that there had been intraperitoneal spillage of dye. The right and left peritoneum were opened parallel to the IP ligament to open the retroperitoneal spaces bilaterally. There was no successful SLN mapping identified in either basin. The para rectal and  paravesical spaces were opened up.  The hysterectomy was started after the round ligament on the right side was incised and the retroperitoneum was entered and the pararectal space was developed.  The ureter was noted to be on the medial leaf of the broad ligament.  The peritoneum above the ureter was incised and stretched and the infundibulopelvic ligament was skeletonized, cauterized and cut.  The posterior peritoneum was taken down to the level of the KOH ring.  The anterior peritoneum was also taken down.  The bladder flap was created to the level of the KOH ring.  The uterine artery on the right side was skeletonized, cauterized and cut in the normal manner.  A similar procedure was performed on the left.  The colpotomy was made and the uterus, cervix, bilateral ovaries and tubes were amputated and delivered through the vagina.  Pedicles were inspected and excellent hemostasis was achieved.    The paravesical space was developed with monopolar and sharp dissection. It was held open with tension on the median umbilical ligament with the forth arm. The paravesical space was opened with blunt and sharp dissection to mobilize the ureter off of the medial surface of the internal iliac artery. The medial leaf of the broad ligament containing the ureter was held medially (opening the pararectal space) by the assistant's grasper. The right pelvic lymphadenectomy was performed by skeletonizing the internal iliac artery at the bifurcation with the external iliac artery. The obturator nerve was identified in the base of lateral paravesical space. The ureter was mobilized medially off of the dissection by developing the pararectal space. The genitofemoral nerve was identified, skeletonized and mobilized laterally off of the external iliac artery. An enbloc resection of lymph nodes was performed within the following boundaries: the mid portion of the common iliac proximally, the circumflex iliac vein distally, the  obturator nerve posteriorally, the genitofemoral nerve laterally. The nodal basin (including obturator space) were confirmed to be empty of nodes and hemostatic. The nodes were placed in an endocatch bag and retrieved vaginally.  A similar dissection was performed on the left. Surgiflow was utilized in the left retroperitoneum to reinforce hemostasis.  The colpotomy at the vaginal cuff was closed with Vicryl on a CT1 needle in a running manner.  Irrigation was used and excellent hemostasis was achieved.  At this point in the procedure was completed.  Robotic instruments were removed under direct visulaization.  The robot was undocked. The 10 mm ports were closed with Vicryl on a UR-5 needle and the fascia was closed with 0 Vicryl on a UR-5 needle.  The skin was closed with 4-0 Vicryl in a subcuticular manner.  Dermabond was applied.  Sponge, lap and needle counts correct x 2.  The patient was taken to the recovery room in stable condition.  The vagina was swabbed with  minimal bleeding noted.   All instrument and needle counts were correct x  3.   The patient was transferred to the recovery room in stable condition.  Donaciano Eva, MD

## 2016-01-16 NOTE — Transfer of Care (Signed)
Immediate Anesthesia Transfer of Care Note  Patient: Kimberly Larsen  Procedure(s) Performed: Procedure(s): XI ROBOTIC ASSISTED TOTAL HYSTERECTOMY WITH BILATERAL SALPINGO OOPHORECTOMY AND BILATERAL PELVIC LYMPH NODE DISSECTION (Bilateral)  Patient Location: PACU  Anesthesia Type:General  Level of Consciousness: Patient easily awoken, sedated, comfortable, cooperative, following commands, responds to stimulation.   Airway & Oxygen Therapy: Patient spontaneously breathing, ventilating well, oxygen via simple oxygen mask.  Post-op Assessment: Report given to PACU RN, vital signs reviewed and stable, moving all extremities.   Post vital signs: Reviewed and stable.  Complications: No apparent anesthesia complications   Last Vitals:  Vitals:   01/16/16 0847 01/16/16 1422  BP: (!) 136/56 130/64  Pulse: 66 66  Resp: 16 12  Temp: 36.7 C 36.3 C    Last Pain:  Vitals:   01/16/16 1011  TempSrc:   PainSc: 0-No pain      Patients Stated Pain Goal: 5 ([id number])  Complications: No apparent anesthesia complications

## 2016-01-16 NOTE — Anesthesia Postprocedure Evaluation (Signed)
Anesthesia Post Note  Patient: Kimberly Larsen  Procedure(s) Performed: Procedure(s) (LRB): XI ROBOTIC ASSISTED TOTAL HYSTERECTOMY WITH BILATERAL SALPINGO OOPHORECTOMY AND BILATERAL PELVIC LYMPH NODE DISSECTION (Bilateral)  Patient location during evaluation: PACU Anesthesia Type: General Level of consciousness: awake and alert Pain management: pain level controlled Vital Signs Assessment: post-procedure vital signs reviewed and stable Respiratory status: spontaneous breathing, nonlabored ventilation, respiratory function stable and patient connected to nasal cannula oxygen Cardiovascular status: blood pressure returned to baseline and stable Postop Assessment: no signs of nausea or vomiting Anesthetic complications: no    Last Vitals:  Vitals:   01/16/16 1500 01/16/16 1510  BP: 122/61   Pulse: 77 73  Resp: 12 11  Temp:      Last Pain:  Vitals:   01/16/16 1510  TempSrc:   PainSc: [place]

## 2016-01-16 NOTE — H&P (View-Only) (Signed)
Consult Note: Gyn-Onc  Kimberly Larsen 70 y.o. female  CC: No chief complaint on file.   HPI: Patient is seen today in consultation at the request of Dr. Pamala Hurry.   Patient is a very pleasant 70 year old gravida 1 para 1 who has a long history of renal stones and has been seen by urology. She presented to urology and was seen by Dr. Alinda Money on August 30 for complaints of gross hematuria and right lower quadrant pain. However at that time she was noted to have vaginal bleeding and her bladder catheterization revealed no blood within the bladder. She was seen by Dr. Valentino Saxon on 12/29/2015. At that time an endometrial biopsy was performed as well as a Pap smear. Pap smear was unremarkable. 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 Prior to this episode of bleeding she had not had any menses for approximately 16 years.  She's overall doing quite well. She did have some low back pain associated with this bleeding like if she were having a menstrual cycle. After hearing the results of the biopsy she's had some slight nausea but no emesis. She's been taking ginger root at night which is helping with that. She is quite anxious. She does complain of shortness of breath but it seems to more anxiety related. She can go up a flight of stairs without difficulty. She has difficulty going downstairs that she fell once going down stairs and is now fearful. She cannot walk long distances secondary to pain in her legs from her degenerative joint disease. She is up-to-date on her mammograms having had one in March 2017. Should a colonoscopy in May 2017 that revealed one polyp. It was benign. Is recommended that she have follow-up in 5 years. She has no significant family history for cancer. She's comes accompanied by a friend.  Review of Systems  Constitutional:  Denies fever. Skin: No rash Cardiovascular: No chest pain, shortness of breath except as above, or edema  Pulmonary: No cough   Gastro Intestinal: Slight nausea, vomiting, constipation, or diarrhea reported. No change in bowel movement.  Genitourinary: No frequency, urgency, or dysuria.  + vaginal bleeding, no discharge.  Musculoskeletal: + Low back pain and joint pain in her knees.  Neurologic: No weakness, numbness, or change in gait.  Psychology: Anxious  Current Meds:  Outpatient Encounter Prescriptions as of 01/10/2016  Medication Sig  . acidophilus (RISAQUAD) CAPS capsule Take 1 capsule by mouth daily.  Marland Kitchen amLODipine (NORVASC) 10 MG tablet Take 1 tablet (10 mg total) by mouth daily.  Marland Kitchen amoxicillin-clavulanate (AUGMENTIN) 875-125 MG per tablet Take 1 tablet by mouth every 12 (twelve) hours. (Patient not taking: Reported on 05/19/2014)  . aspirin EC 81 MG tablet Take 81 mg by mouth daily.  . B Complex-C (SUPER B COMPLEX) TABS Take 1 tablet by mouth daily.  . Biotin 5000 MCG TABS Take 2 tablets by mouth daily.  . DULoxetine (CYMBALTA) 30 MG capsule   . DULoxetine (CYMBALTA) 60 MG capsule Take 60 mg by mouth daily.  . fluconazole (DIFLUCAN) 100 MG tablet   . fluticasone (FLONASE) 50 MCG/ACT nasal spray Place 1 spray into both nostrils daily.  Marland Kitchen gabapentin (NEURONTIN) 600 MG tablet Take 600 mg by mouth 3 (three) times daily.  . hydrochlorothiazide (MICROZIDE) 12.5 MG capsule Take 2 capsules (25 mg total) by mouth daily. (Patient taking differently: Take 12.5 mg by mouth daily. )  . HYDROcodone-acetaminophen (NORCO/VICODIN) 5-325 MG per tablet Take 1-2 tablets by mouth every 6 (six) hours as  needed. (Patient not taking: Reported on 05/19/2014)  . levofloxacin (LEVAQUIN) 500 MG tablet Take 1 tablet (500 mg total) by mouth daily. (Patient not taking: Reported on 05/19/2014)  . Melatonin 10 MG CAPS Take 10 mg by mouth at bedtime.  . meloxicam (MOBIC) 15 MG tablet Take 1 tablet (15 mg total) by mouth daily.  . methocarbamol (ROBAXIN) 500 MG tablet Take 1 tablet (500 mg total) by mouth 3 (three) times daily as needed for  muscle spasms. (Patient not taking: Reported on 05/19/2014)  . metoprolol succinate (TOPROL-XL) 25 MG 24 hr tablet Take 25 mg by mouth daily.  . Multiple Vitamin (MULTIVITAMIN WITH MINERALS) TABS Take 1 tablet by mouth daily.  . NUCYNTA 50 MG TABS tablet   . Omega-3 Fatty Acids (FISH OIL) 600 MG CAPS Take 1 capsule by mouth 2 (two) times daily.  . ondansetron (ZOFRAN) 4 MG tablet Take 1 tablet (4 mg total) by mouth every 8 (eight) hours as needed for nausea or vomiting. (Patient not taking: Reported on 05/19/2014)  . oxyCODONE-acetaminophen (PERCOCET) 10-325 MG per tablet Take 1 tablet by mouth every 6 (six) hours as needed for pain. (Patient not taking: Reported on 07/07/2014)  . potassium chloride 20 MEQ TBCR Take 20 mEq by mouth daily. (Patient not taking: Reported on 05/19/2014)  . pravastatin (PRAVACHOL) 40 MG tablet Take 1 tablet (40 mg total) by mouth at bedtime.  . sulfamethoxazole-trimethoprim (BACTRIM DS,SEPTRA DS) 800-160 MG per tablet   . traMADol (ULTRAM) 50 MG tablet Take 100 mg by mouth 3 (three) times daily.  . traZODone (DESYREL) 100 MG tablet Take 100 mg by mouth at bedtime.  . vitamin C (ASCORBIC ACID) 500 MG tablet Take 500 mg by mouth daily.  . Zinc 50 MG CAPS Take 1 capsule (50 mg total) by mouth daily.   No facility-administered encounter medications on file as of 01/10/2016.     Allergy: No Known Allergies  Social Hx:   Social History   Social History  . Marital status: Divorced    Spouse name: N/A  . Number of children: N/A  . Years of education: N/A   Occupational History  . Not on file.   Social History Main Topics  . Smoking status: Never Smoker  . Smokeless tobacco: Not on file  . Alcohol use No  . Drug use: No  . Sexual activity: Yes   Other Topics Concern  . Not on file   Social History Narrative  . No narrative on file    Past Surgical Hx:  Past Surgical History:  Procedure Laterality Date  . BACK SURGERY  2013  . CARPAL TUNNEL RELEASE      R hand  . CYSTOSCOPY WITH URETEROSCOPY AND STENT PLACEMENT Bilateral 03/25/2014   Procedure: CYSTOSCOPY WITH URETEROSCOPY AND STENT PLACEMENT;  Surgeon: Raynelle Bring, MD;  Location: [facility];  Service: Urology;  Laterality: Bilateral;  . CYSTOSCOPY WITH URETEROSCOPY AND STENT PLACEMENT Bilateral 04/18/2014   Procedure: CYSTOSCOPY WITH URETEROSCOPY AND STENT PLACEMENT,RETROGRADE;  Surgeon: Raynelle Bring, MD;  Location: [facility];  Service: Urology;  Laterality: Bilateral;  . CYSTOSCOPY WITH URETEROSCOPY AND STENT PLACEMENT Right 05/30/2014   Procedure: CYSTOSCOPY WITH URETEROSCOPY AND STENT PLACEMENT;  Surgeon: Raynelle Bring, MD;  Location: [facility];  Service: Urology;  Laterality: Right;  . CYSTOSCOPY/RETROGRADE/URETEROSCOPY Left 05/30/2014   Procedure: LEFT RETROGRADE;  Surgeon: Raynelle Bring, MD;  Location: [facility];  Service: Urology;  Laterality: Left;  . GANGLION CYST EXCISION     L ankle  .  HOLMIUM LASER APPLICATION Bilateral [id number]   Procedure: HOLMIUM LASER APPLICATION;  Surgeon: Raynelle Bring, MD;  Location: [facility];  Service: Urology;  Laterality: Bilateral;  . JOINT REPLACEMENT     total knee  . LITHOTRIPSY    . LUMBAR FUSION  09/2011  . RHINOPLASTY    . SPINAL CORD STIMULATOR INSERTION N/A 02/23/2014   Procedure: SPINAL CORD STIMULATOR PLACEMENT ;  Surgeon: Melina Schools, MD;  Location: [place];  Service: Orthopedics;  Laterality: N/A;  . TONSILLECTOMY    . TOTAL KNEE ARTHROPLASTY Right 08/31/2012   Procedure: RIGHT TOTAL KNEE ARTHROPLASTY;  Surgeon: Mauri Pole, MD;  Location: [facility];  Service: Orthopedics;  Laterality: Right;  with LMA  . TUBAL LIGATION      Past Medical Hx:  Past Medical History:  Diagnosis Date  . Anxiety   . Arthritis   . Back pain   . DDD (degenerative disc disease), lumbar   . Depression   . History of kidney stones   . HLD (hyperlipidemia)   . Hx of sepsis 02/2014   DUE TO MULTIPLE KIDNEY STONES  . Hypertension   . Osteoporosis     Oncology Hx:     Endometrial cancer ([place])   12/29/2015 Initial Diagnosis    Endometrial cancer ([place])       Family Hx: No family history on file.  Vitals:  There were no vitals taken for this visit.  Physical Exam: Well-nourished well-developed female in no acute distress.   Neck: Supple, no lymphadenopathy, no thyromegaly.  Lungs: Clear to auscultation.  Cardiac: Regular rate and rhythm.  Abdomen: Obese, soft, nontender, nondistended. No palpable masses. There is no rebound, no guarding. No fluid wave. There is no hepatosplenomegaly.  Groins: No lymphadenopathy  Extremities: No edema  Pelvic: Normal female genitalia. The vagina is atrophic. The cervix is visualized. It is multiparous. Slight thin brown discharge. No gross visible lesions. No active bleeding. Bimanual examination the cervix is palpably normal. The uterus is of normal size shape and consistency there are no adnexal masses. Rectal confirms. There is no nodularity.  Assessment/Plan:  70 year old with a clinical stage I uterine serous carcinoma. There are no obvious contraindications for surgery. We would recommend definitive surgery with robotic hysterectomy, bilateral salpingo-oophorectomy and sentinel lymph node procedure. We will obtain a CT scan of the abdomen and pelvis to rule out metastatic disease prior to her surgery.  We have tentatively scheduled her surgery for September 19 of Dr. Everitt Amber. She understands that she'll have the opportunity to meet Dr. Denman George the morning of surgery.  Risks of surgery including bleeding, infection, injury to surrounding organs, thromboembolic disease, and infection were discussed with the patient. We discussed relative sentinel lymph node assessment and the excellent sensitivity of this procedure and the benefits with regards to operative time and decrease risk of lymphedema. The friend that is with her had sentinel lymph nodes for her breast cancer and confirmed what we discussed today  regarding being sentinel lymph node removal and that made the patient feel very reassured.  She and I also discussed that in many scenarios even in women who have disease confined to the uterus we would recommend adjuvant. This would be secondary to the histology of her tumor and not necessarily related to this stage. Adjuvant therapy could include chemotherapy as well as radiation.  Her questions were elicited in answer to her satisfaction. We will call her with the results of her CT scan.  Brodie Correll A., MD 01/10/2016, 10:28  AM

## 2016-01-16 NOTE — Interval H&P Note (Signed)
History and Physical Interval Note:  01/16/2016 10:42 AM  Kimberly Larsen  has presented today for surgery, with the diagnosis of endometrial cancer  The various methods of treatment have been discussed with the patient and family. After consideration of risks, benefits and other options for treatment, the patient has consented to  Procedure(s): XI ROBOTIC ASSISTED TOTAL HYSTERECTOMY WITH BILATERAL SALPINGO OOPHORECTOMY AND SENTINAL LYMPH NODE BIOPSY (Bilateral) as a surgical intervention .  The patient's history has been reviewed, patient examined, no change in status, stable for surgery.  I have reviewed the patient's chart and labs.  Questions were answered to the patient's satisfaction.     Donaciano Eva

## 2016-01-16 NOTE — Anesthesia Procedure Notes (Signed)
Procedure Name: Intubation Date/Time: 01/16/2016 11:40 AM Performed by: Deliah Boston Pre-anesthesia Checklist: Patient identified, Emergency Drugs available, Suction available and Patient being monitored Patient Re-evaluated:Patient Re-evaluated prior to inductionOxygen Delivery Method: Circle system utilized Preoxygenation: Pre-oxygenation with 100% oxygen Intubation Type: IV induction Ventilation: Mask ventilation without difficulty Laryngoscope Size: Mac and 3 Grade View: Grade I Tube type: Oral Tube size: 7.0 mm Number of attempts: 1 Airway Equipment and Method: Stylet and Oral airway Placement Confirmation: ETT inserted through vocal cords under direct vision,  positive ETCO2 and breath sounds checked- equal and bilateral Secured at: 22 cm Tube secured with: Tape Dental Injury: Teeth and Oropharynx as per pre-operative assessment

## 2016-01-17 DIAGNOSIS — N83202 Unspecified ovarian cyst, left side: Secondary | ICD-10-CM | POA: Diagnosis not present

## 2016-01-17 DIAGNOSIS — D259 Leiomyoma of uterus, unspecified: Secondary | ICD-10-CM | POA: Diagnosis not present

## 2016-01-17 DIAGNOSIS — C541 Malignant neoplasm of endometrium: Secondary | ICD-10-CM | POA: Diagnosis not present

## 2016-01-17 DIAGNOSIS — N841 Polyp of cervix uteri: Secondary | ICD-10-CM | POA: Diagnosis not present

## 2016-01-17 DIAGNOSIS — N83201 Unspecified ovarian cyst, right side: Secondary | ICD-10-CM | POA: Diagnosis not present

## 2016-01-17 DIAGNOSIS — Z7982 Long term (current) use of aspirin: Secondary | ICD-10-CM | POA: Diagnosis not present

## 2016-01-17 LAB — BASIC METABOLIC PANEL
Anion gap: 7 (ref 5–15)
BUN: 18 mg/dL (ref 6–20)
CO2: 32 mmol/L (ref 22–32)
Calcium: 8.8 mg/dL — ABNORMAL LOW (ref 8.9–10.3)
Chloride: 101 mmol/L (ref 101–111)
Creatinine, Ser: 0.83 mg/dL (ref 0.44–1.00)
GFR calc Af Amer: >60 mL/min (ref >60)
GFR calc non Af Amer: >60 mL/min (ref >60)
Glucose, Bld: 124 mg/dL — ABNORMAL HIGH (ref 65–99)
Potassium: 3.7 mmol/L (ref 3.5–5.1)
Sodium: 140 mmol/L (ref 135–145)

## 2016-01-17 LAB — CBC
HCT: 36.1 % (ref 36.0–46.0)
Hemoglobin: 11.4 g/dL — ABNORMAL LOW (ref 12.0–15.0)
MCH: 29.3 pg (ref 26.0–34.0)
MCHC: 31.6 g/dL (ref 30.0–36.0)
MCV: 92.8 fL (ref 78.0–100.0)
Platelets: 239 10*3/uL (ref 150–400)
RBC: 3.89 MIL/uL (ref 3.87–5.11)
RDW: 14.7 % (ref 11.5–15.5)
WBC: 15.7 10*3/uL — ABNORMAL HIGH (ref 4.0–10.5)

## 2016-01-17 MED ORDER — OXYCODONE-ACETAMINOPHEN 5-325 MG PO TABS: 1.0000 | ORAL_TABLET | ORAL | 0 refills | Status: DC | PRN

## 2016-01-17 MED ORDER — SENNA 8.6 MG PO TABS: 1.0000 | ORAL_TABLET | Freq: Every day | ORAL | 0 refills | Status: DC

## 2016-01-17 MED ORDER — IBUPROFEN 800 MG PO TABS: 800.0000 mg | ORAL_TABLET | Freq: Three times a day (TID) | ORAL | 0 refills | Status: DC | PRN

## 2016-01-17 NOTE — Discharge Summary (Signed)
Physician Discharge Summary  [name] MRN: [id number] DOB/AGE: May 27, 1945 70 y.o.  Admit date: 01/16/2016 Discharge date: 01/17/2016  Admission Diagnoses: Endometrial cancer [facility])  Discharge Diagnoses:  Principal Problem:   Endometrial cancer [facility])   Discharged Condition: good  Hospital Course: the patient was admitted on 01/16/16 for robotic assisted hysterectomy, BSO, bilateral pelvic lymphadenectomy for serous endometrial cancer. Surgery was uneventful as was her postoperative course. On POD 1 she was tolerating PO and meeting discharge criteria. At the time of this note she had not yet begun voiding urine.  Consults: None  Significant Diagnostic Studies: labs:  CBC    Component Value Date/Time   WBC 15.7 (H) 01/17/2016 0339   RBC 3.89 01/17/2016 0339   HGB 11.4 (L) 01/17/2016 0339   HCT 36.1 01/17/2016 0339   PLT 239 01/17/2016 0339   MCV 92.8 01/17/2016 0339   MCH 29.3 01/17/2016 0339   MCHC 31.6 01/17/2016 0339   RDW 14.7 01/17/2016 0339   LYMPHSABS 1.6 01/12/2016 1120   MONOABS 0.9 01/12/2016 1120   EOSABS 0.3 01/12/2016 1120   BASOSABS 0.1 01/12/2016 1120     Treatments: surgery: see above  Discharge Exam: Blood pressure (!) 119/50, pulse 63, temperature 97.7 F (36.5 C), temperature source Oral, resp. rate 16, height 5\' 2"  (1.575 m), weight 185 lb 6.4 oz (84.1 kg), SpO2 96 %. General appearance: alert and cooperative Resp: clear to auscultation bilaterally Cardio: regular rate and rhythm, S1, S2 normal, no murmur, click, rub or gallop GI: soft, non-tender; bowel sounds normal; no masses,  no organomegaly Incision/Wound: clean dry and intact  Disposition: 01-Home or Self Care  Discharge Instructions    (HEART FAILURE PATIENTS) Call MD:  Anytime you have any of the following symptoms: 1) 3 pound weight gain in 24 hours or 5 pounds in 1 week 2) shortness of breath, with or without a dry hacking cough 3) swelling in the hands, feet or stomach  4) if you have to sleep on extra pillows at night in order to breathe.    Complete by:  As directed    Call MD for:  difficulty breathing, headache or visual disturbances    Complete by:  As directed    Call MD for:  extreme fatigue    Complete by:  As directed    Call MD for:  hives    Complete by:  As directed    Call MD for:  persistant dizziness or light-headedness    Complete by:  As directed    Call MD for:  persistant nausea and vomiting    Complete by:  As directed    Call MD for:  redness, tenderness, or signs of infection (pain, swelling, redness, odor or green/yellow discharge around incision site)    Complete by:  As directed    Call MD for:  severe uncontrolled pain    Complete by:  As directed    Call MD for:  temperature >100.4    Complete by:  As directed    Diet - low sodium heart healthy    Complete by:  As directed    Diet general    Complete by:  As directed    Driving Restrictions    Complete by:  As directed    No driving for 7 days or until off narcotic pain medication   Increase activity slowly    Complete by:  As directed    Remove dressing in 24 hours    Complete by:  As directed    Sexual Activity Restrictions    Complete by:  As directed    No intercourse for 6 weeks       Medication List    TAKE these medications   amLODipine 10 MG tablet Commonly known as:  NORVASC Take 1 tablet (10 mg total) by mouth daily.   aspirin EC 81 MG tablet Take 81 mg by mouth daily.   DULoxetine 60 MG capsule Commonly known as:  CYMBALTA Take 60 mg by mouth daily.   Fish Oil 600 MG Caps Take 1 capsule by mouth 2 (two) times daily.   fluticasone 50 MCG/ACT nasal spray Commonly known as:  FLONASE Place 1 spray into both nostrils daily.   gabapentin 600 MG tablet Commonly known as:  NEURONTIN Take 600 mg by mouth 3 (three) times daily.   hydrochlorothiazide 12.5 MG capsule Commonly known as:  MICROZIDE Take 2 capsules (25 mg total) by mouth  daily. What changed:  how much to take   HYDROcodone-acetaminophen 5-325 MG tablet Commonly known as:  NORCO/VICODIN Take 1-2 tablets by mouth every 6 (six) hours as needed.   ibuprofen 800 MG tablet Commonly known as:  ADVIL,MOTRIN Take 1 tablet (800 mg total) by mouth every 8 (eight) hours as needed (mild pain).   Melatonin 10 MG Caps Take 10 mg by mouth at bedtime.   metoprolol succinate 25 MG 24 hr tablet Commonly known as:  TOPROL-XL Take 25 mg by mouth daily.   multivitamin with minerals Tabs tablet Take 1 tablet by mouth daily.   ondansetron 4 MG tablet Commonly known as:  ZOFRAN Take 1 tablet (4 mg total) by mouth every 8 (eight) hours as needed for nausea or vomiting.   oxyCODONE-acetaminophen 10-325 MG tablet Commonly known as:  PERCOCET Take 1 tablet by mouth every 6 (six) hours as needed for pain. What changed:  Another medication with the same name was added. Make sure you understand how and when to take each.   oxyCODONE-acetaminophen 5-325 MG tablet Commonly known as:  PERCOCET/ROXICET Take 1-2 tablets by mouth every 4 (four) hours as needed (moderate to severe pain (when tolerating fluids)). What changed:  You were already taking a medication with the same name, and this prescription was added. Make sure you understand how and when to take each.   Potassium Chloride ER 20 MEQ Tbcr Take 20 mEq by mouth daily.   pravastatin 40 MG tablet Commonly known as:  PRAVACHOL Take 1 tablet (40 mg total) by mouth at bedtime.   senna 8.6 MG Tabs tablet Commonly known as:  SENOKOT Take 1 tablet (8.6 mg total) by mouth at bedtime.   SUPER B COMPLEX Tabs Take 1 tablet by mouth daily.   traZODone 100 MG tablet Commonly known as:  DESYREL Take 100 mg by mouth at bedtime.   vitamin C 500 MG tablet Commonly known as:  ASCORBIC ACID Take 500 mg by mouth daily.   Zinc 50 MG Caps Take 1 capsule (50 mg total) by mouth daily.      Follow-up Information    Donaciano Eva, MD Follow up on 02/05/2016.   Specialty:  Obstetrics and Gynecology Why:  at 12:15pm at the [facility] for post-op follow up Contact information: [address] [place] [address] [phone]           Signed: Donaciano Eva 01/17/2016, 8:50 AM

## 2016-01-17 NOTE — Progress Notes (Signed)
1 Day Post-Op Procedure(s) (LRB): XI ROBOTIC ASSISTED TOTAL HYSTERECTOMY WITH BILATERAL SALPINGO OOPHORECTOMY AND BILATERAL PELVIC LYMPH NODE DISSECTION (Bilateral)  Subjective: Patient reports mild gas pains this am.  Getting out of bed with assistance to attempt to urinate.  Pain controlled with PRN medications.  Has not eaten breakfast yet but getting ready to order her meal and no nausea or emesis reported.  Denies chest pain, dyspnea, passing flatus, or having a bowel movement.      Objective: Vital signs in last 24 hours: Temp:  [97.4 F (36.3 C)-98 F (36.7 C)] 97.7 F (36.5 C) (09/20 0546) Pulse Rate:  [60-78] 63 (09/20 0546) Resp:  [0-18] 16 (09/20 0546) BP: (108-160)/(50-71) 119/50 (09/20 0546) SpO2:  [86 %-100 %] 96 % (09/20 0546) Weight:  [185 lb 6.4 oz (84.1 kg)] 185 lb 6.4 oz (84.1 kg) (09/19 0913) Last BM Date: 01/16/16  Intake/Output from previous day: 09/19 0701 - 09/20 0700 In: 12332.5 [P.O.:120; I.V.:12212.5] Out: 2400 [Urine:2350; Blood:50]  Physical Examination: General: alert, cooperative and no distress Resp: clear to auscultation bilaterally Cardio: regular rate and rhythm, S1, S2 normal, no murmur, click, rub or gallop GI: soft, non-tender; bowel sounds normal; no masses,  no organomegaly, incision: lap sites with dermabond without erythema or drainage and abdomen slightly tympanic on percussion Extremities: extremities normal, atraumatic, no cyanosis or edema  Labs: WBC/Hgb/Hct/Plts:  15.7/11.4/36.1/239 (09/20 [id number]) BUN/Cr/glu/ALT/AST/amyl/lip:  18/0.83/--/--/--/--/-- (09/20 [id number])  Assessment: 70 y.o. s/p Procedure(s): XI ROBOTIC ASSISTED TOTAL HYSTERECTOMY WITH BILATERAL SALPINGO OOPHORECTOMY AND BILATERAL PELVIC LYMPH NODE DISSECTION: stable Pain:  Pain is well-controlled on PRN medications.  Heme: Hgb 11.4 and Hct 36.1 this am.  Stable post-op.  CV: BP and HR stable post-op.  GI:  Tolerating po: Yes.  GU: Due to void since foley  removal.  FEN: Stable post-op.  Prophylaxis: pharmacologic prophylaxis (with any of the following: enoxaparin (Lovenox) 40mg  SQ 2 hours prior to surgery then every day) and intermittent pneumatic compression boots.  Plan: Diet as tolerated Plan for discharge later this am per Dr. Denman George if voiding, pain controlled, diet tolerated   LOS: 0 days    Kimberly Larsen DEAL 01/17/2016, 8:56 AM

## 2016-01-17 NOTE — Progress Notes (Signed)
Discharge instructions discussed with patient until no further questions ask. Patient is voiding qs, tolerating regular food and has adequate pain relief with oral pain med. Iv discontinued. Pt discharged

## 2016-01-19 ENCOUNTER — Telehealth: Payer: Self-pay | Admitting: Gynecologic Oncology

## 2016-01-19 NOTE — Telephone Encounter (Signed)
Patient doing well post-op.  Informed of final path.  Informed that we would have her biopsy results reviewed and would discuss her case at the next tumor conference in Oct.  Advised to follow up as scheduled or sooner if needed.  Advised of Dr. Serita Grit recommendations for no additional treatment needed at this time based on the surgical path.  Advised we would be reviewing her original biopsy as well.

## 2016-01-22 ENCOUNTER — Telehealth: Payer: Self-pay | Admitting: Gynecologic Oncology

## 2016-01-22 NOTE — Telephone Encounter (Signed)
Spoke with patient and advised her that [facility] would be making a disk with her CT images and our office would take it to [facility].  Complaining of intermittent lower pelvic pain.  Stating it felt like a kidney stone and resolved after taking oxycodone and walking around.  She has an appt this Wed with [facility].  Bowels and bladder functioning without difficulty.  States the pain eases off after she has been up for awhile so she is not sure if it is her chronic back pain.  Reportable signs and symptoms reviewed.  Advised to call for any needs or concerns.

## 2016-01-25 DIAGNOSIS — N201 Calculus of ureter: Secondary | ICD-10-CM | POA: Diagnosis not present

## 2016-01-26 DIAGNOSIS — Z23 Encounter for immunization: Secondary | ICD-10-CM | POA: Diagnosis not present

## 2016-02-01 ENCOUNTER — Other Ambulatory Visit: Payer: Self-pay | Admitting: Gynecologic Oncology

## 2016-02-01 DIAGNOSIS — C541 Malignant neoplasm of endometrium: Secondary | ICD-10-CM | POA: Diagnosis not present

## 2016-02-05 ENCOUNTER — Ambulatory Visit: Payer: Medicare Other | Attending: Gynecologic Oncology | Admitting: Gynecologic Oncology

## 2016-02-05 ENCOUNTER — Encounter: Payer: Self-pay | Admitting: Gynecologic Oncology

## 2016-02-05 VITALS — BP 107/44 | HR 67 | Temp 98.1°F | Resp 18 | Ht 62.0 in | Wt 192.3 lb

## 2016-02-05 DIAGNOSIS — M810 Age-related osteoporosis without current pathological fracture: Secondary | ICD-10-CM | POA: Insufficient documentation

## 2016-02-05 DIAGNOSIS — Z7982 Long term (current) use of aspirin: Secondary | ICD-10-CM | POA: Diagnosis not present

## 2016-02-05 DIAGNOSIS — F418 Other specified anxiety disorders: Secondary | ICD-10-CM | POA: Diagnosis not present

## 2016-02-05 DIAGNOSIS — Z981 Arthrodesis status: Secondary | ICD-10-CM | POA: Insufficient documentation

## 2016-02-05 DIAGNOSIS — Z96651 Presence of right artificial knee joint: Secondary | ICD-10-CM | POA: Diagnosis not present

## 2016-02-05 DIAGNOSIS — Z888 Allergy status to other drugs, medicaments and biological substances status: Secondary | ICD-10-CM | POA: Insufficient documentation

## 2016-02-05 DIAGNOSIS — Z880 Allergy status to penicillin: Secondary | ICD-10-CM | POA: Diagnosis not present

## 2016-02-05 DIAGNOSIS — E785 Hyperlipidemia, unspecified: Secondary | ICD-10-CM | POA: Insufficient documentation

## 2016-02-05 DIAGNOSIS — K3184 Gastroparesis: Secondary | ICD-10-CM | POA: Insufficient documentation

## 2016-02-05 DIAGNOSIS — C541 Malignant neoplasm of endometrium: Secondary | ICD-10-CM

## 2016-02-05 DIAGNOSIS — Z87442 Personal history of urinary calculi: Secondary | ICD-10-CM | POA: Diagnosis not present

## 2016-02-05 DIAGNOSIS — Z823 Family history of stroke: Secondary | ICD-10-CM | POA: Diagnosis not present

## 2016-02-05 DIAGNOSIS — I10 Essential (primary) hypertension: Secondary | ICD-10-CM | POA: Diagnosis not present

## 2016-02-05 DIAGNOSIS — Z90722 Acquired absence of ovaries, bilateral: Secondary | ICD-10-CM | POA: Diagnosis not present

## 2016-02-05 DIAGNOSIS — Z90710 Acquired absence of both cervix and uterus: Secondary | ICD-10-CM | POA: Diagnosis not present

## 2016-02-05 NOTE — Patient Instructions (Signed)
Follow with Dr Everitt Amber on April 09 , 2018 at 09:15 AM as discussed during your visit today .  Call with any changes , questions or concerns.  Thank you

## 2016-02-05 NOTE — Progress Notes (Signed)
Consult Note: Gyn-Onc  Kimberly Larsen 70 y.o. female  CC:  Chief Complaint  Patient presents with  . endometrial cancer    Post-op follow up visit   Assessment/Plan:  70 year old with stage IA grade 1 endometrioid endometrial adenocarcinoma.  Her endometrial biopsy (originally read as high grade serous) has been reviewed and felt to be more consistent with moderately differentiated endometrial cancer.  Therefore, given the dominant grade 1 disease in her hystectomy specimen, we will ascribe her to a low risk cancer.  Pathology revealed low risk factors for recurrence, therefore no adjuvant therapy is recommended according to NCCN guidelines.  I discussed risk for recurrence and typical symptoms encouraged her to notify us of these should they develop between visits.  I recommend she have follow-up every 6 months for 5 years in accordance with NCCN guidelines. Those visits should include symptom assessment, physical exam and pelvic examination. Pap smears are not indicated or recommended in the routine surveillance of endometrial cancer.  HPI: Patient was originally seen on 01/10/16 in consultation at the request of Dr. Pamala Hurry.   Patient is a very pleasant 70 year old gravida 1 para 1 who has a long history of renal stones and has been seen by urology. She presented to urology and was seen by Dr. Alinda Money on August 30 for complaints of gross hematuria and right lower quadrant pain. However at that time she was noted to have vaginal bleeding and her bladder catheterization revealed no blood within the bladder. She was seen by Dr. Valentino Saxon on 12/29/2015. At that time an endometrial biopsy was performed as well as a Pap smear. Pap smear was unremarkable. 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 Prior to this episode of bleeding she had not had any menses for approximately 16 years.  She's overall doing quite well. She did have some low back pain associated with this  bleeding like if she were having a menstrual cycle. After hearing the results of the biopsy she's had some slight nausea but no emesis. She's been taking ginger root at night which is helping with that. She is quite anxious. She does complain of shortness of breath but it seems to more anxiety related. She can go up a flight of stairs without difficulty. She has difficulty going downstairs that she fell once going down stairs and is now fearful. She cannot walk long distances secondary to pain in her legs from her degenerative joint disease. She is up-to-date on her mammograms having had one in March 2017. Should a colonoscopy in May 2017 that revealed one polyp. It was benign. Is recommended that she have follow-up in 5 years. She has no significant family history for cancer. She's comes accompanied by a friend.   Review of Systems  Constitutional:  Denies fever. Skin: No rash Cardiovascular: No chest pain, shortness of breath except as above, or edema  Pulmonary: No cough  Gastro Intestinal: Slight nausea, vomiting, constipation, or diarrhea reported. No change in bowel movement.  Genitourinary: No frequency, urgency, or dysuria.  + vaginal bleeding, no discharge.  Musculoskeletal: + Low back pain and joint pain in her knees.  Neurologic: No weakness, numbness, or change in gait.  Psychology: Anxious  Current Meds:  Outpatient Encounter Prescriptions as of 02/05/2016  Medication Sig  . amLODipine (NORVASC) 10 MG tablet Take 1 tablet (10 mg total) by mouth daily. (Patient taking differently: Take 5 mg by mouth daily. )  . aspirin EC 81 MG tablet Take 81 mg by mouth daily.  Marland Kitchen  DULoxetine (CYMBALTA) 60 MG capsule Take 60 mg by mouth daily.  . fluticasone (FLONASE) 50 MCG/ACT nasal spray Place 1 spray into both nostrils daily.  . hydrochlorothiazide (MICROZIDE) 12.5 MG capsule Take 2 capsules (25 mg total) by mouth daily. (Patient taking differently: Take 12.5 mg by mouth daily. )  . metoprolol  succinate (TOPROL-XL) 25 MG 24 hr tablet Take 75 mg by mouth daily.   . Multiple Vitamin (MULTIVITAMIN WITH MINERALS) TABS Take 1 tablet by mouth daily.  . Omega-3 Fatty Acids (FISH OIL) 600 MG CAPS Take 1 capsule by mouth 2 (two) times daily.  Marland Kitchen oxyCODONE-acetaminophen (PERCOCET) 10-325 MG per tablet Take 1 tablet by mouth every 6 (six) hours as needed for pain.  . potassium chloride 20 MEQ TBCR Take 20 mEq by mouth daily.  . pravastatin (PRAVACHOL) 40 MG tablet Take 1 tablet (40 mg total) by mouth at bedtime.  . traZODone (DESYREL) 100 MG tablet Take 100 mg by mouth at bedtime.  . vitamin C (ASCORBIC ACID) 500 MG tablet Take 500 mg by mouth daily.  . Zinc 50 MG CAPS Take 1 capsule (50 mg total) by mouth daily.  . B Complex-C (SUPER B COMPLEX) TABS Take 1 tablet by mouth daily. (Patient not taking: Reported on 02/05/2016)  . gabapentin (NEURONTIN) 600 MG tablet Take 600 mg by mouth 3 (three) times daily.  . Melatonin 10 MG CAPS Take 10 mg by mouth at bedtime.  . ondansetron (ZOFRAN) 4 MG tablet Take 1 tablet (4 mg total) by mouth every 8 (eight) hours as needed for nausea or vomiting. (Patient not taking: Reported on 02/05/2016)  . senna (SENOKOT) 8.6 MG TABS tablet Take 1 tablet (8.6 mg total) by mouth at bedtime. (Patient not taking: Reported on 02/05/2016)  . [DISCONTINUED] HYDROcodone-acetaminophen (NORCO/VICODIN) 5-325 MG per tablet Take 1-2 tablets by mouth every 6 (six) hours as needed. (Patient taking differently: Take 1-2 tablets by mouth every 6 (six) hours as needed. )  . [DISCONTINUED] ibuprofen (ADVIL,MOTRIN) 800 MG tablet Take 1 tablet (800 mg total) by mouth every 8 (eight) hours as needed (mild pain).  . [DISCONTINUED] oxyCODONE-acetaminophen (PERCOCET/ROXICET) 5-325 MG tablet Take 1-2 tablets by mouth every 4 (four) hours as needed (moderate to severe pain (when tolerating fluids)).   No facility-administered encounter medications on file as of 02/05/2016.     Allergy:  Allergies   Allergen Reactions  . Augmentin [Amoxicillin-Pot Clavulanate] Nausea Only  . Wellbutrin [Bupropion] Nausea Only    Social Hx:   Social History   Social History  . Marital status: Divorced    Spouse name: N/A  . Number of children: N/A  . Years of education: N/A   Occupational History  . Not on file.   Social History Main Topics  . Smoking status: Never Smoker  . Smokeless tobacco: Never Used  . Alcohol use No  . Drug use: No  . Sexual activity: Yes   Other Topics Concern  . Not on file   Social History Narrative  . No narrative on file    Past Surgical Hx:  Past Surgical History:  Procedure Laterality Date  . BACK SURGERY  2013  . CARPAL TUNNEL RELEASE     R hand  . CYSTOSCOPY WITH URETEROSCOPY AND STENT PLACEMENT Bilateral 03/25/2014   Procedure: CYSTOSCOPY WITH URETEROSCOPY AND STENT PLACEMENT;  Surgeon: Raynelle Bring, MD;  Location: [facility];  Service: Urology;  Laterality: Bilateral;  . CYSTOSCOPY WITH URETEROSCOPY AND STENT PLACEMENT Bilateral 04/18/2014   Procedure: CYSTOSCOPY  WITH URETEROSCOPY AND STENT PLACEMENT,RETROGRADE;  Surgeon: Raynelle Bring, MD;  Location: [facility];  Service: Urology;  Laterality: Bilateral;  . CYSTOSCOPY WITH URETEROSCOPY AND STENT PLACEMENT Right 05/30/2014   Procedure: CYSTOSCOPY WITH URETEROSCOPY AND STENT PLACEMENT;  Surgeon: Raynelle Bring, MD;  Location: [facility];  Service: Urology;  Laterality: Right;  . CYSTOSCOPY/RETROGRADE/URETEROSCOPY Left 05/30/2014   Procedure: LEFT RETROGRADE;  Surgeon: Raynelle Bring, MD;  Location: [facility];  Service: Urology;  Laterality: Left;  . EYE SURGERY     cataract surgery bil  . GANGLION CYST EXCISION     L ankle  . HOLMIUM LASER APPLICATION Bilateral [id number]   Procedure: HOLMIUM LASER APPLICATION;  Surgeon: Raynelle Bring, MD;  Location: [facility];  Service: Urology;  Laterality: Bilateral;  . JOINT REPLACEMENT     total knee  . LITHOTRIPSY    . LUMBAR FUSION  09/2011  . RHINOPLASTY    . ROBOTIC  ASSISTED TOTAL HYSTERECTOMY WITH BILATERAL SALPINGO OOPHERECTOMY Bilateral 01/16/2016   Procedure: XI ROBOTIC ASSISTED TOTAL HYSTERECTOMY WITH BILATERAL SALPINGO OOPHORECTOMY AND BILATERAL PELVIC LYMPH NODE DISSECTION;  Surgeon: Everitt Amber, MD;  Location: [facility];  Service: Gynecology;  Laterality: Bilateral;  . SPINAL CORD STIMULATOR INSERTION N/A 02/23/2014   Procedure: SPINAL CORD STIMULATOR PLACEMENT ;  Surgeon: Melina Schools, MD;  Location: [facility];  Service: Orthopedics;  Laterality: N/A;  . TONSILLECTOMY    . TOTAL KNEE ARTHROPLASTY Right 08/31/2012   Procedure: RIGHT TOTAL KNEE ARTHROPLASTY;  Surgeon: Mauri Pole, MD;  Location: [facility];  Service: Orthopedics;  Laterality: Right;  with LMA  . TUBAL LIGATION      Past Medical Hx:  Past Medical History:  Diagnosis Date  . Anxiety   . Arthritis   . Back pain   . Cancer [facility])    endometrial cancer  . DDD (degenerative disc disease), lumbar   . Depression   . Gastroparesis   . History of kidney stones   . HLD (hyperlipidemia)   . Hx of sepsis 02/2014   DUE TO MULTIPLE KIDNEY STONES  . Hypertension   . Osteoporosis   . Pneumonia    2014    Oncology Hx:    Endometrial cancer ([place])   12/29/2015 Initial Diagnosis    Endometrial cancer ([place])       Family Hx:  Family History  Problem Relation Age of Onset  . Stroke Mother   . Stroke Father     Vitals:  Blood pressure (!) 107/44, pulse 67, temperature 98.1 F (36.7 C), temperature source Oral, resp. rate 18, height 5\' 2"  (1.575 m), weight 192 lb 4.8 oz (87.2 kg), SpO2 96 %.  Physical Exam: Well-nourished well-developed female in no acute distress.   Neck: Supple, no lymphadenopathy, no thyromegaly.  Lungs: Clear to auscultation.  Cardiac: Regular rate and rhythm.  Abdomen: Obese, soft, nontender, nondistended. No palpable masses. There is no rebound, no guarding. No fluid wave. There is no hepatosplenomegaly. Incisions are well healed.  Groins: No  lymphadenopathy  Extremities: No edema  Pelvic: Normal female genitalia. The vagina is atrophic. Vaginal cuff healing normally. No blood or discharge. Cuff in tact.   30 minutes of direct face to face counseling time was spent with the patient. This included discussion about prognosis, therapy recommendations and postoperative side effects and are beyond the scope of routine postoperative care.  Donaciano Eva, MD 02/05/2016, 1:14 PM

## 2016-02-19 ENCOUNTER — Encounter: Payer: Self-pay | Admitting: Gynecologic Oncology

## 2016-02-19 NOTE — Progress Notes (Signed)
Gynecologic Oncology Multi-Disciplinary Disposition Conference Note  Date of the Conference: February 19, 2016  Patient Name: Kimberly Larsen  Referring Provider: Dr. Pamala Hurry Primary GYN Oncologist: Dr. Everitt Amber  Stage/Disposition:  Stage IA Gr 1 endometrioid adenocarcinoma.  Disposition is to close surveillance.   This Multidisciplinary conference took place involving physicians from [place], [place], Radiation Oncology, Pathology, Radiology along with the Gynecologic Oncology Nurse Practitioner and RN.  Comprehensive assessment of the patient's malignancy, staging, need for surgery, chemotherapy, radiation therapy, and need for further testing were reviewed. Supportive measures, both inpatient and following discharge were also discussed. The recommended plan of care is documented. Greater than 35 minutes were spent correlating and coordinating this patient's care.

## 2016-03-28 DIAGNOSIS — D225 Melanocytic nevi of trunk: Secondary | ICD-10-CM | POA: Diagnosis not present

## 2016-03-28 DIAGNOSIS — D485 Neoplasm of uncertain behavior of skin: Secondary | ICD-10-CM | POA: Diagnosis not present

## 2016-03-28 DIAGNOSIS — L814 Other melanin hyperpigmentation: Secondary | ICD-10-CM | POA: Diagnosis not present

## 2016-03-28 DIAGNOSIS — L821 Other seborrheic keratosis: Secondary | ICD-10-CM | POA: Diagnosis not present

## 2016-03-28 DIAGNOSIS — D2239 Melanocytic nevi of other parts of face: Secondary | ICD-10-CM | POA: Diagnosis not present

## 2016-04-01 DIAGNOSIS — M545 Low back pain: Secondary | ICD-10-CM | POA: Diagnosis not present

## 2016-04-01 DIAGNOSIS — Z6834 Body mass index (BMI) 34.0-34.9, adult: Secondary | ICD-10-CM | POA: Diagnosis not present

## 2016-04-01 DIAGNOSIS — I10 Essential (primary) hypertension: Secondary | ICD-10-CM | POA: Diagnosis not present

## 2016-04-01 DIAGNOSIS — E669 Obesity, unspecified: Secondary | ICD-10-CM | POA: Diagnosis not present

## 2016-04-01 DIAGNOSIS — Z13820 Encounter for screening for osteoporosis: Secondary | ICD-10-CM | POA: Diagnosis not present

## 2016-04-01 DIAGNOSIS — Z0000 Encounter for general adult medical examination without abnormal findings: Secondary | ICD-10-CM | POA: Diagnosis not present

## 2016-04-01 DIAGNOSIS — E7800 Pure hypercholesterolemia, unspecified: Secondary | ICD-10-CM | POA: Diagnosis not present

## 2016-04-01 DIAGNOSIS — E049 Nontoxic goiter, unspecified: Secondary | ICD-10-CM | POA: Diagnosis not present

## 2016-04-01 DIAGNOSIS — Z1239 Encounter for other screening for malignant neoplasm of breast: Secondary | ICD-10-CM | POA: Diagnosis not present

## 2016-04-10 DIAGNOSIS — J0100 Acute maxillary sinusitis, unspecified: Secondary | ICD-10-CM | POA: Diagnosis not present

## 2016-04-18 DIAGNOSIS — M9904 Segmental and somatic dysfunction of sacral region: Secondary | ICD-10-CM | POA: Diagnosis not present

## 2016-04-18 DIAGNOSIS — M9901 Segmental and somatic dysfunction of cervical region: Secondary | ICD-10-CM | POA: Diagnosis not present

## 2016-04-18 DIAGNOSIS — M9903 Segmental and somatic dysfunction of lumbar region: Secondary | ICD-10-CM | POA: Diagnosis not present

## 2016-04-18 DIAGNOSIS — M9902 Segmental and somatic dysfunction of thoracic region: Secondary | ICD-10-CM | POA: Diagnosis not present

## 2016-04-18 DIAGNOSIS — S29019A Strain of muscle and tendon of unspecified wall of thorax, initial encounter: Secondary | ICD-10-CM | POA: Diagnosis not present

## 2016-04-18 DIAGNOSIS — M5387 Other specified dorsopathies, lumbosacral region: Secondary | ICD-10-CM | POA: Diagnosis not present

## 2016-04-18 DIAGNOSIS — M5032 Other cervical disc degeneration, mid-cervical region, unspecified level: Secondary | ICD-10-CM | POA: Diagnosis not present

## 2016-04-18 DIAGNOSIS — M542 Cervicalgia: Secondary | ICD-10-CM | POA: Diagnosis not present

## 2016-04-19 DIAGNOSIS — M9901 Segmental and somatic dysfunction of cervical region: Secondary | ICD-10-CM | POA: Diagnosis not present

## 2016-04-19 DIAGNOSIS — S29019A Strain of muscle and tendon of unspecified wall of thorax, initial encounter: Secondary | ICD-10-CM | POA: Diagnosis not present

## 2016-04-19 DIAGNOSIS — M9903 Segmental and somatic dysfunction of lumbar region: Secondary | ICD-10-CM | POA: Diagnosis not present

## 2016-04-19 DIAGNOSIS — M542 Cervicalgia: Secondary | ICD-10-CM | POA: Diagnosis not present

## 2016-04-19 DIAGNOSIS — M5032 Other cervical disc degeneration, mid-cervical region, unspecified level: Secondary | ICD-10-CM | POA: Diagnosis not present

## 2016-04-19 DIAGNOSIS — M5387 Other specified dorsopathies, lumbosacral region: Secondary | ICD-10-CM | POA: Diagnosis not present

## 2016-04-19 DIAGNOSIS — M9904 Segmental and somatic dysfunction of sacral region: Secondary | ICD-10-CM | POA: Diagnosis not present

## 2016-04-19 DIAGNOSIS — M9902 Segmental and somatic dysfunction of thoracic region: Secondary | ICD-10-CM | POA: Diagnosis not present

## 2016-04-24 DIAGNOSIS — M9904 Segmental and somatic dysfunction of sacral region: Secondary | ICD-10-CM | POA: Diagnosis not present

## 2016-04-24 DIAGNOSIS — M9902 Segmental and somatic dysfunction of thoracic region: Secondary | ICD-10-CM | POA: Diagnosis not present

## 2016-04-24 DIAGNOSIS — S29019A Strain of muscle and tendon of unspecified wall of thorax, initial encounter: Secondary | ICD-10-CM | POA: Diagnosis not present

## 2016-04-24 DIAGNOSIS — M5032 Other cervical disc degeneration, mid-cervical region, unspecified level: Secondary | ICD-10-CM | POA: Diagnosis not present

## 2016-04-24 DIAGNOSIS — M5387 Other specified dorsopathies, lumbosacral region: Secondary | ICD-10-CM | POA: Diagnosis not present

## 2016-04-24 DIAGNOSIS — M542 Cervicalgia: Secondary | ICD-10-CM | POA: Diagnosis not present

## 2016-04-24 DIAGNOSIS — M9903 Segmental and somatic dysfunction of lumbar region: Secondary | ICD-10-CM | POA: Diagnosis not present

## 2016-04-24 DIAGNOSIS — M9901 Segmental and somatic dysfunction of cervical region: Secondary | ICD-10-CM | POA: Diagnosis not present

## 2016-04-25 DIAGNOSIS — M9904 Segmental and somatic dysfunction of sacral region: Secondary | ICD-10-CM | POA: Diagnosis not present

## 2016-04-25 DIAGNOSIS — M9901 Segmental and somatic dysfunction of cervical region: Secondary | ICD-10-CM | POA: Diagnosis not present

## 2016-04-25 DIAGNOSIS — S29019A Strain of muscle and tendon of unspecified wall of thorax, initial encounter: Secondary | ICD-10-CM | POA: Diagnosis not present

## 2016-04-25 DIAGNOSIS — M542 Cervicalgia: Secondary | ICD-10-CM | POA: Diagnosis not present

## 2016-04-25 DIAGNOSIS — M5387 Other specified dorsopathies, lumbosacral region: Secondary | ICD-10-CM | POA: Diagnosis not present

## 2016-04-25 DIAGNOSIS — M9903 Segmental and somatic dysfunction of lumbar region: Secondary | ICD-10-CM | POA: Diagnosis not present

## 2016-04-25 DIAGNOSIS — M9902 Segmental and somatic dysfunction of thoracic region: Secondary | ICD-10-CM | POA: Diagnosis not present

## 2016-04-25 DIAGNOSIS — M5032 Other cervical disc degeneration, mid-cervical region, unspecified level: Secondary | ICD-10-CM | POA: Diagnosis not present

## 2016-04-30 DIAGNOSIS — M9904 Segmental and somatic dysfunction of sacral region: Secondary | ICD-10-CM | POA: Diagnosis not present

## 2016-04-30 DIAGNOSIS — S29019A Strain of muscle and tendon of unspecified wall of thorax, initial encounter: Secondary | ICD-10-CM | POA: Diagnosis not present

## 2016-04-30 DIAGNOSIS — M542 Cervicalgia: Secondary | ICD-10-CM | POA: Diagnosis not present

## 2016-04-30 DIAGNOSIS — M9902 Segmental and somatic dysfunction of thoracic region: Secondary | ICD-10-CM | POA: Diagnosis not present

## 2016-04-30 DIAGNOSIS — M9903 Segmental and somatic dysfunction of lumbar region: Secondary | ICD-10-CM | POA: Diagnosis not present

## 2016-04-30 DIAGNOSIS — M5387 Other specified dorsopathies, lumbosacral region: Secondary | ICD-10-CM | POA: Diagnosis not present

## 2016-04-30 DIAGNOSIS — M5032 Other cervical disc degeneration, mid-cervical region, unspecified level: Secondary | ICD-10-CM | POA: Diagnosis not present

## 2016-04-30 DIAGNOSIS — M9901 Segmental and somatic dysfunction of cervical region: Secondary | ICD-10-CM | POA: Diagnosis not present

## 2016-05-01 DIAGNOSIS — M9904 Segmental and somatic dysfunction of sacral region: Secondary | ICD-10-CM | POA: Diagnosis not present

## 2016-05-01 DIAGNOSIS — M5032 Other cervical disc degeneration, mid-cervical region, unspecified level: Secondary | ICD-10-CM | POA: Diagnosis not present

## 2016-05-01 DIAGNOSIS — M9901 Segmental and somatic dysfunction of cervical region: Secondary | ICD-10-CM | POA: Diagnosis not present

## 2016-05-01 DIAGNOSIS — M9903 Segmental and somatic dysfunction of lumbar region: Secondary | ICD-10-CM | POA: Diagnosis not present

## 2016-05-01 DIAGNOSIS — S29019A Strain of muscle and tendon of unspecified wall of thorax, initial encounter: Secondary | ICD-10-CM | POA: Diagnosis not present

## 2016-05-01 DIAGNOSIS — M5387 Other specified dorsopathies, lumbosacral region: Secondary | ICD-10-CM | POA: Diagnosis not present

## 2016-05-01 DIAGNOSIS — M542 Cervicalgia: Secondary | ICD-10-CM | POA: Diagnosis not present

## 2016-05-01 DIAGNOSIS — M9902 Segmental and somatic dysfunction of thoracic region: Secondary | ICD-10-CM | POA: Diagnosis not present

## 2016-05-06 DIAGNOSIS — S29019A Strain of muscle and tendon of unspecified wall of thorax, initial encounter: Secondary | ICD-10-CM | POA: Diagnosis not present

## 2016-05-06 DIAGNOSIS — M9901 Segmental and somatic dysfunction of cervical region: Secondary | ICD-10-CM | POA: Diagnosis not present

## 2016-05-06 DIAGNOSIS — M542 Cervicalgia: Secondary | ICD-10-CM | POA: Diagnosis not present

## 2016-05-06 DIAGNOSIS — M9902 Segmental and somatic dysfunction of thoracic region: Secondary | ICD-10-CM | POA: Diagnosis not present

## 2016-05-06 DIAGNOSIS — M9904 Segmental and somatic dysfunction of sacral region: Secondary | ICD-10-CM | POA: Diagnosis not present

## 2016-05-06 DIAGNOSIS — M5032 Other cervical disc degeneration, mid-cervical region, unspecified level: Secondary | ICD-10-CM | POA: Diagnosis not present

## 2016-05-06 DIAGNOSIS — M9903 Segmental and somatic dysfunction of lumbar region: Secondary | ICD-10-CM | POA: Diagnosis not present

## 2016-05-06 DIAGNOSIS — M5387 Other specified dorsopathies, lumbosacral region: Secondary | ICD-10-CM | POA: Diagnosis not present

## 2016-05-08 DIAGNOSIS — M7751 Other enthesopathy of right foot: Secondary | ICD-10-CM | POA: Diagnosis not present

## 2016-05-08 DIAGNOSIS — M79672 Pain in left foot: Secondary | ICD-10-CM | POA: Diagnosis not present

## 2016-05-08 DIAGNOSIS — M6589 Other synovitis and tenosynovitis, multiple sites: Secondary | ICD-10-CM | POA: Diagnosis not present

## 2016-05-08 DIAGNOSIS — M79671 Pain in right foot: Secondary | ICD-10-CM | POA: Diagnosis not present

## 2016-05-09 DIAGNOSIS — M542 Cervicalgia: Secondary | ICD-10-CM | POA: Diagnosis not present

## 2016-05-09 DIAGNOSIS — M9901 Segmental and somatic dysfunction of cervical region: Secondary | ICD-10-CM | POA: Diagnosis not present

## 2016-05-09 DIAGNOSIS — M5032 Other cervical disc degeneration, mid-cervical region, unspecified level: Secondary | ICD-10-CM | POA: Diagnosis not present

## 2016-05-09 DIAGNOSIS — M9903 Segmental and somatic dysfunction of lumbar region: Secondary | ICD-10-CM | POA: Diagnosis not present

## 2016-05-09 DIAGNOSIS — M9904 Segmental and somatic dysfunction of sacral region: Secondary | ICD-10-CM | POA: Diagnosis not present

## 2016-05-09 DIAGNOSIS — S29019A Strain of muscle and tendon of unspecified wall of thorax, initial encounter: Secondary | ICD-10-CM | POA: Diagnosis not present

## 2016-05-09 DIAGNOSIS — M9902 Segmental and somatic dysfunction of thoracic region: Secondary | ICD-10-CM | POA: Diagnosis not present

## 2016-05-09 DIAGNOSIS — M5387 Other specified dorsopathies, lumbosacral region: Secondary | ICD-10-CM | POA: Diagnosis not present

## 2016-05-10 DIAGNOSIS — M5387 Other specified dorsopathies, lumbosacral region: Secondary | ICD-10-CM | POA: Diagnosis not present

## 2016-05-10 DIAGNOSIS — M9903 Segmental and somatic dysfunction of lumbar region: Secondary | ICD-10-CM | POA: Diagnosis not present

## 2016-05-10 DIAGNOSIS — M542 Cervicalgia: Secondary | ICD-10-CM | POA: Diagnosis not present

## 2016-05-10 DIAGNOSIS — M9904 Segmental and somatic dysfunction of sacral region: Secondary | ICD-10-CM | POA: Diagnosis not present

## 2016-05-10 DIAGNOSIS — M5032 Other cervical disc degeneration, mid-cervical region, unspecified level: Secondary | ICD-10-CM | POA: Diagnosis not present

## 2016-05-10 DIAGNOSIS — S29019A Strain of muscle and tendon of unspecified wall of thorax, initial encounter: Secondary | ICD-10-CM | POA: Diagnosis not present

## 2016-05-10 DIAGNOSIS — M9901 Segmental and somatic dysfunction of cervical region: Secondary | ICD-10-CM | POA: Diagnosis not present

## 2016-05-10 DIAGNOSIS — M9902 Segmental and somatic dysfunction of thoracic region: Secondary | ICD-10-CM | POA: Diagnosis not present

## 2016-05-20 DIAGNOSIS — M5387 Other specified dorsopathies, lumbosacral region: Secondary | ICD-10-CM | POA: Diagnosis not present

## 2016-05-20 DIAGNOSIS — M9904 Segmental and somatic dysfunction of sacral region: Secondary | ICD-10-CM | POA: Diagnosis not present

## 2016-05-20 DIAGNOSIS — M9901 Segmental and somatic dysfunction of cervical region: Secondary | ICD-10-CM | POA: Diagnosis not present

## 2016-05-20 DIAGNOSIS — M9903 Segmental and somatic dysfunction of lumbar region: Secondary | ICD-10-CM | POA: Diagnosis not present

## 2016-05-20 DIAGNOSIS — S29019A Strain of muscle and tendon of unspecified wall of thorax, initial encounter: Secondary | ICD-10-CM | POA: Diagnosis not present

## 2016-05-20 DIAGNOSIS — M9902 Segmental and somatic dysfunction of thoracic region: Secondary | ICD-10-CM | POA: Diagnosis not present

## 2016-05-20 DIAGNOSIS — M542 Cervicalgia: Secondary | ICD-10-CM | POA: Diagnosis not present

## 2016-05-20 DIAGNOSIS — M5032 Other cervical disc degeneration, mid-cervical region, unspecified level: Secondary | ICD-10-CM | POA: Diagnosis not present

## 2016-05-23 DIAGNOSIS — M5387 Other specified dorsopathies, lumbosacral region: Secondary | ICD-10-CM | POA: Diagnosis not present

## 2016-05-23 DIAGNOSIS — M9903 Segmental and somatic dysfunction of lumbar region: Secondary | ICD-10-CM | POA: Diagnosis not present

## 2016-05-23 DIAGNOSIS — M9904 Segmental and somatic dysfunction of sacral region: Secondary | ICD-10-CM | POA: Diagnosis not present

## 2016-05-23 DIAGNOSIS — M542 Cervicalgia: Secondary | ICD-10-CM | POA: Diagnosis not present

## 2016-05-23 DIAGNOSIS — R8271 Bacteriuria: Secondary | ICD-10-CM | POA: Diagnosis not present

## 2016-05-23 DIAGNOSIS — M9901 Segmental and somatic dysfunction of cervical region: Secondary | ICD-10-CM | POA: Diagnosis not present

## 2016-05-23 DIAGNOSIS — N200 Calculus of kidney: Secondary | ICD-10-CM | POA: Diagnosis not present

## 2016-05-23 DIAGNOSIS — M9902 Segmental and somatic dysfunction of thoracic region: Secondary | ICD-10-CM | POA: Diagnosis not present

## 2016-05-23 DIAGNOSIS — M5032 Other cervical disc degeneration, mid-cervical region, unspecified level: Secondary | ICD-10-CM | POA: Diagnosis not present

## 2016-05-23 DIAGNOSIS — S29019A Strain of muscle and tendon of unspecified wall of thorax, initial encounter: Secondary | ICD-10-CM | POA: Diagnosis not present

## 2016-05-24 DIAGNOSIS — M961 Postlaminectomy syndrome, not elsewhere classified: Secondary | ICD-10-CM | POA: Diagnosis not present

## 2016-05-24 DIAGNOSIS — G894 Chronic pain syndrome: Secondary | ICD-10-CM | POA: Diagnosis not present

## 2016-05-27 DIAGNOSIS — M5032 Other cervical disc degeneration, mid-cervical region, unspecified level: Secondary | ICD-10-CM | POA: Diagnosis not present

## 2016-05-27 DIAGNOSIS — M9901 Segmental and somatic dysfunction of cervical region: Secondary | ICD-10-CM | POA: Diagnosis not present

## 2016-05-27 DIAGNOSIS — M5387 Other specified dorsopathies, lumbosacral region: Secondary | ICD-10-CM | POA: Diagnosis not present

## 2016-05-27 DIAGNOSIS — S29019A Strain of muscle and tendon of unspecified wall of thorax, initial encounter: Secondary | ICD-10-CM | POA: Diagnosis not present

## 2016-05-27 DIAGNOSIS — M9904 Segmental and somatic dysfunction of sacral region: Secondary | ICD-10-CM | POA: Diagnosis not present

## 2016-05-27 DIAGNOSIS — M9903 Segmental and somatic dysfunction of lumbar region: Secondary | ICD-10-CM | POA: Diagnosis not present

## 2016-05-27 DIAGNOSIS — M9902 Segmental and somatic dysfunction of thoracic region: Secondary | ICD-10-CM | POA: Diagnosis not present

## 2016-05-27 DIAGNOSIS — M542 Cervicalgia: Secondary | ICD-10-CM | POA: Diagnosis not present

## 2016-05-30 DIAGNOSIS — M5032 Other cervical disc degeneration, mid-cervical region, unspecified level: Secondary | ICD-10-CM | POA: Diagnosis not present

## 2016-05-30 DIAGNOSIS — M9901 Segmental and somatic dysfunction of cervical region: Secondary | ICD-10-CM | POA: Diagnosis not present

## 2016-05-30 DIAGNOSIS — M542 Cervicalgia: Secondary | ICD-10-CM | POA: Diagnosis not present

## 2016-05-30 DIAGNOSIS — S29019A Strain of muscle and tendon of unspecified wall of thorax, initial encounter: Secondary | ICD-10-CM | POA: Diagnosis not present

## 2016-05-30 DIAGNOSIS — M5387 Other specified dorsopathies, lumbosacral region: Secondary | ICD-10-CM | POA: Diagnosis not present

## 2016-05-30 DIAGNOSIS — M9903 Segmental and somatic dysfunction of lumbar region: Secondary | ICD-10-CM | POA: Diagnosis not present

## 2016-05-30 DIAGNOSIS — M9902 Segmental and somatic dysfunction of thoracic region: Secondary | ICD-10-CM | POA: Diagnosis not present

## 2016-05-30 DIAGNOSIS — M9904 Segmental and somatic dysfunction of sacral region: Secondary | ICD-10-CM | POA: Diagnosis not present

## 2016-05-31 DIAGNOSIS — M25572 Pain in left ankle and joints of left foot: Secondary | ICD-10-CM | POA: Diagnosis not present

## 2016-05-31 DIAGNOSIS — R2689 Other abnormalities of gait and mobility: Secondary | ICD-10-CM | POA: Diagnosis not present

## 2016-05-31 DIAGNOSIS — M25571 Pain in right ankle and joints of right foot: Secondary | ICD-10-CM | POA: Diagnosis not present

## 2016-05-31 DIAGNOSIS — M25671 Stiffness of right ankle, not elsewhere classified: Secondary | ICD-10-CM | POA: Diagnosis not present

## 2016-05-31 DIAGNOSIS — M25672 Stiffness of left ankle, not elsewhere classified: Secondary | ICD-10-CM | POA: Diagnosis not present

## 2016-06-03 DIAGNOSIS — M25571 Pain in right ankle and joints of right foot: Secondary | ICD-10-CM | POA: Diagnosis not present

## 2016-06-03 DIAGNOSIS — R2689 Other abnormalities of gait and mobility: Secondary | ICD-10-CM | POA: Diagnosis not present

## 2016-06-03 DIAGNOSIS — M25672 Stiffness of left ankle, not elsewhere classified: Secondary | ICD-10-CM | POA: Diagnosis not present

## 2016-06-03 DIAGNOSIS — M25572 Pain in left ankle and joints of left foot: Secondary | ICD-10-CM | POA: Diagnosis not present

## 2016-06-03 DIAGNOSIS — M25671 Stiffness of right ankle, not elsewhere classified: Secondary | ICD-10-CM | POA: Diagnosis not present

## 2016-06-04 DIAGNOSIS — M25671 Stiffness of right ankle, not elsewhere classified: Secondary | ICD-10-CM | POA: Diagnosis not present

## 2016-06-04 DIAGNOSIS — R2689 Other abnormalities of gait and mobility: Secondary | ICD-10-CM | POA: Diagnosis not present

## 2016-06-04 DIAGNOSIS — M25672 Stiffness of left ankle, not elsewhere classified: Secondary | ICD-10-CM | POA: Diagnosis not present

## 2016-06-04 DIAGNOSIS — M25572 Pain in left ankle and joints of left foot: Secondary | ICD-10-CM | POA: Diagnosis not present

## 2016-06-04 DIAGNOSIS — M25571 Pain in right ankle and joints of right foot: Secondary | ICD-10-CM | POA: Diagnosis not present

## 2016-06-06 ENCOUNTER — Telehealth: Payer: Self-pay

## 2016-06-06 DIAGNOSIS — M9902 Segmental and somatic dysfunction of thoracic region: Secondary | ICD-10-CM | POA: Diagnosis not present

## 2016-06-06 DIAGNOSIS — M9901 Segmental and somatic dysfunction of cervical region: Secondary | ICD-10-CM | POA: Diagnosis not present

## 2016-06-06 DIAGNOSIS — M542 Cervicalgia: Secondary | ICD-10-CM | POA: Diagnosis not present

## 2016-06-06 DIAGNOSIS — M5387 Other specified dorsopathies, lumbosacral region: Secondary | ICD-10-CM | POA: Diagnosis not present

## 2016-06-06 DIAGNOSIS — M9903 Segmental and somatic dysfunction of lumbar region: Secondary | ICD-10-CM | POA: Diagnosis not present

## 2016-06-06 DIAGNOSIS — M9904 Segmental and somatic dysfunction of sacral region: Secondary | ICD-10-CM | POA: Diagnosis not present

## 2016-06-06 DIAGNOSIS — S29019A Strain of muscle and tendon of unspecified wall of thorax, initial encounter: Secondary | ICD-10-CM | POA: Diagnosis not present

## 2016-06-06 DIAGNOSIS — M5032 Other cervical disc degeneration, mid-cervical region, unspecified level: Secondary | ICD-10-CM | POA: Diagnosis not present

## 2016-06-06 NOTE — Telephone Encounter (Signed)
Told Kimberly Larsen that she did not need a prescription for this laxative.  It is an OTC product. Kimberly Larsen verbalized understanding.

## 2016-06-07 DIAGNOSIS — M25571 Pain in right ankle and joints of right foot: Secondary | ICD-10-CM | POA: Diagnosis not present

## 2016-06-07 DIAGNOSIS — M25671 Stiffness of right ankle, not elsewhere classified: Secondary | ICD-10-CM | POA: Diagnosis not present

## 2016-06-07 DIAGNOSIS — M25572 Pain in left ankle and joints of left foot: Secondary | ICD-10-CM | POA: Diagnosis not present

## 2016-06-07 DIAGNOSIS — R2689 Other abnormalities of gait and mobility: Secondary | ICD-10-CM | POA: Diagnosis not present

## 2016-06-07 DIAGNOSIS — M25672 Stiffness of left ankle, not elsewhere classified: Secondary | ICD-10-CM | POA: Diagnosis not present

## 2016-06-10 DIAGNOSIS — R2689 Other abnormalities of gait and mobility: Secondary | ICD-10-CM | POA: Diagnosis not present

## 2016-06-10 DIAGNOSIS — M25571 Pain in right ankle and joints of right foot: Secondary | ICD-10-CM | POA: Diagnosis not present

## 2016-06-10 DIAGNOSIS — M25671 Stiffness of right ankle, not elsewhere classified: Secondary | ICD-10-CM | POA: Diagnosis not present

## 2016-06-10 DIAGNOSIS — M25672 Stiffness of left ankle, not elsewhere classified: Secondary | ICD-10-CM | POA: Diagnosis not present

## 2016-06-10 DIAGNOSIS — M25572 Pain in left ankle and joints of left foot: Secondary | ICD-10-CM | POA: Diagnosis not present

## 2016-06-12 DIAGNOSIS — M25572 Pain in left ankle and joints of left foot: Secondary | ICD-10-CM | POA: Diagnosis not present

## 2016-06-12 DIAGNOSIS — R2689 Other abnormalities of gait and mobility: Secondary | ICD-10-CM | POA: Diagnosis not present

## 2016-06-12 DIAGNOSIS — M25571 Pain in right ankle and joints of right foot: Secondary | ICD-10-CM | POA: Diagnosis not present

## 2016-06-12 DIAGNOSIS — M25671 Stiffness of right ankle, not elsewhere classified: Secondary | ICD-10-CM | POA: Diagnosis not present

## 2016-06-12 DIAGNOSIS — M25672 Stiffness of left ankle, not elsewhere classified: Secondary | ICD-10-CM | POA: Diagnosis not present

## 2016-06-13 DIAGNOSIS — M9903 Segmental and somatic dysfunction of lumbar region: Secondary | ICD-10-CM | POA: Diagnosis not present

## 2016-06-13 DIAGNOSIS — H04123 Dry eye syndrome of bilateral lacrimal glands: Secondary | ICD-10-CM | POA: Diagnosis not present

## 2016-06-13 DIAGNOSIS — M542 Cervicalgia: Secondary | ICD-10-CM | POA: Diagnosis not present

## 2016-06-13 DIAGNOSIS — M9901 Segmental and somatic dysfunction of cervical region: Secondary | ICD-10-CM | POA: Diagnosis not present

## 2016-06-13 DIAGNOSIS — M5032 Other cervical disc degeneration, mid-cervical region, unspecified level: Secondary | ICD-10-CM | POA: Diagnosis not present

## 2016-06-13 DIAGNOSIS — M9902 Segmental and somatic dysfunction of thoracic region: Secondary | ICD-10-CM | POA: Diagnosis not present

## 2016-06-13 DIAGNOSIS — M5387 Other specified dorsopathies, lumbosacral region: Secondary | ICD-10-CM | POA: Diagnosis not present

## 2016-06-13 DIAGNOSIS — S29019A Strain of muscle and tendon of unspecified wall of thorax, initial encounter: Secondary | ICD-10-CM | POA: Diagnosis not present

## 2016-06-13 DIAGNOSIS — M9904 Segmental and somatic dysfunction of sacral region: Secondary | ICD-10-CM | POA: Diagnosis not present

## 2016-06-13 DIAGNOSIS — H25813 Combined forms of age-related cataract, bilateral: Secondary | ICD-10-CM | POA: Diagnosis not present

## 2016-06-14 DIAGNOSIS — R2689 Other abnormalities of gait and mobility: Secondary | ICD-10-CM | POA: Diagnosis not present

## 2016-06-14 DIAGNOSIS — M25571 Pain in right ankle and joints of right foot: Secondary | ICD-10-CM | POA: Diagnosis not present

## 2016-06-14 DIAGNOSIS — M25572 Pain in left ankle and joints of left foot: Secondary | ICD-10-CM | POA: Diagnosis not present

## 2016-06-14 DIAGNOSIS — M25672 Stiffness of left ankle, not elsewhere classified: Secondary | ICD-10-CM | POA: Diagnosis not present

## 2016-06-14 DIAGNOSIS — M25671 Stiffness of right ankle, not elsewhere classified: Secondary | ICD-10-CM | POA: Diagnosis not present

## 2016-06-18 DIAGNOSIS — M25671 Stiffness of right ankle, not elsewhere classified: Secondary | ICD-10-CM | POA: Diagnosis not present

## 2016-06-18 DIAGNOSIS — M25571 Pain in right ankle and joints of right foot: Secondary | ICD-10-CM | POA: Diagnosis not present

## 2016-06-18 DIAGNOSIS — M25672 Stiffness of left ankle, not elsewhere classified: Secondary | ICD-10-CM | POA: Diagnosis not present

## 2016-06-18 DIAGNOSIS — R2689 Other abnormalities of gait and mobility: Secondary | ICD-10-CM | POA: Diagnosis not present

## 2016-06-18 DIAGNOSIS — M25572 Pain in left ankle and joints of left foot: Secondary | ICD-10-CM | POA: Diagnosis not present

## 2016-06-20 DIAGNOSIS — M9902 Segmental and somatic dysfunction of thoracic region: Secondary | ICD-10-CM | POA: Diagnosis not present

## 2016-06-20 DIAGNOSIS — M542 Cervicalgia: Secondary | ICD-10-CM | POA: Diagnosis not present

## 2016-06-20 DIAGNOSIS — M9903 Segmental and somatic dysfunction of lumbar region: Secondary | ICD-10-CM | POA: Diagnosis not present

## 2016-06-20 DIAGNOSIS — S29019A Strain of muscle and tendon of unspecified wall of thorax, initial encounter: Secondary | ICD-10-CM | POA: Diagnosis not present

## 2016-06-20 DIAGNOSIS — M5032 Other cervical disc degeneration, mid-cervical region, unspecified level: Secondary | ICD-10-CM | POA: Diagnosis not present

## 2016-06-20 DIAGNOSIS — M9904 Segmental and somatic dysfunction of sacral region: Secondary | ICD-10-CM | POA: Diagnosis not present

## 2016-06-20 DIAGNOSIS — M9901 Segmental and somatic dysfunction of cervical region: Secondary | ICD-10-CM | POA: Diagnosis not present

## 2016-06-20 DIAGNOSIS — M5387 Other specified dorsopathies, lumbosacral region: Secondary | ICD-10-CM | POA: Diagnosis not present

## 2016-06-21 DIAGNOSIS — R2689 Other abnormalities of gait and mobility: Secondary | ICD-10-CM | POA: Diagnosis not present

## 2016-06-21 DIAGNOSIS — M25571 Pain in right ankle and joints of right foot: Secondary | ICD-10-CM | POA: Diagnosis not present

## 2016-06-21 DIAGNOSIS — M25671 Stiffness of right ankle, not elsewhere classified: Secondary | ICD-10-CM | POA: Diagnosis not present

## 2016-06-21 DIAGNOSIS — M25572 Pain in left ankle and joints of left foot: Secondary | ICD-10-CM | POA: Diagnosis not present

## 2016-06-21 DIAGNOSIS — M25672 Stiffness of left ankle, not elsewhere classified: Secondary | ICD-10-CM | POA: Diagnosis not present

## 2016-06-26 DIAGNOSIS — M25572 Pain in left ankle and joints of left foot: Secondary | ICD-10-CM | POA: Diagnosis not present

## 2016-06-26 DIAGNOSIS — R2689 Other abnormalities of gait and mobility: Secondary | ICD-10-CM | POA: Diagnosis not present

## 2016-06-26 DIAGNOSIS — M25672 Stiffness of left ankle, not elsewhere classified: Secondary | ICD-10-CM | POA: Diagnosis not present

## 2016-06-26 DIAGNOSIS — M25571 Pain in right ankle and joints of right foot: Secondary | ICD-10-CM | POA: Diagnosis not present

## 2016-06-26 DIAGNOSIS — M25671 Stiffness of right ankle, not elsewhere classified: Secondary | ICD-10-CM | POA: Diagnosis not present

## 2016-06-28 DIAGNOSIS — I10 Essential (primary) hypertension: Secondary | ICD-10-CM | POA: Diagnosis not present

## 2016-06-28 DIAGNOSIS — E7800 Pure hypercholesterolemia, unspecified: Secondary | ICD-10-CM | POA: Diagnosis not present

## 2016-06-28 DIAGNOSIS — Z1239 Encounter for other screening for malignant neoplasm of breast: Secondary | ICD-10-CM | POA: Diagnosis not present

## 2016-06-28 DIAGNOSIS — M8589 Other specified disorders of bone density and structure, multiple sites: Secondary | ICD-10-CM | POA: Diagnosis not present

## 2016-07-04 DIAGNOSIS — M9902 Segmental and somatic dysfunction of thoracic region: Secondary | ICD-10-CM | POA: Diagnosis not present

## 2016-07-04 DIAGNOSIS — M542 Cervicalgia: Secondary | ICD-10-CM | POA: Diagnosis not present

## 2016-07-04 DIAGNOSIS — S29019A Strain of muscle and tendon of unspecified wall of thorax, initial encounter: Secondary | ICD-10-CM | POA: Diagnosis not present

## 2016-07-04 DIAGNOSIS — M5387 Other specified dorsopathies, lumbosacral region: Secondary | ICD-10-CM | POA: Diagnosis not present

## 2016-07-04 DIAGNOSIS — M9904 Segmental and somatic dysfunction of sacral region: Secondary | ICD-10-CM | POA: Diagnosis not present

## 2016-07-04 DIAGNOSIS — M9903 Segmental and somatic dysfunction of lumbar region: Secondary | ICD-10-CM | POA: Diagnosis not present

## 2016-07-04 DIAGNOSIS — M9901 Segmental and somatic dysfunction of cervical region: Secondary | ICD-10-CM | POA: Diagnosis not present

## 2016-07-04 DIAGNOSIS — M5032 Other cervical disc degeneration, mid-cervical region, unspecified level: Secondary | ICD-10-CM | POA: Diagnosis not present

## 2016-07-05 DIAGNOSIS — M25672 Stiffness of left ankle, not elsewhere classified: Secondary | ICD-10-CM | POA: Diagnosis not present

## 2016-07-05 DIAGNOSIS — R2689 Other abnormalities of gait and mobility: Secondary | ICD-10-CM | POA: Diagnosis not present

## 2016-07-05 DIAGNOSIS — M25571 Pain in right ankle and joints of right foot: Secondary | ICD-10-CM | POA: Diagnosis not present

## 2016-07-05 DIAGNOSIS — M25671 Stiffness of right ankle, not elsewhere classified: Secondary | ICD-10-CM | POA: Diagnosis not present

## 2016-07-05 DIAGNOSIS — M25572 Pain in left ankle and joints of left foot: Secondary | ICD-10-CM | POA: Diagnosis not present

## 2016-07-07 DIAGNOSIS — J069 Acute upper respiratory infection, unspecified: Secondary | ICD-10-CM | POA: Diagnosis not present

## 2016-07-07 DIAGNOSIS — R05 Cough: Secondary | ICD-10-CM | POA: Diagnosis not present

## 2016-07-18 DIAGNOSIS — J209 Acute bronchitis, unspecified: Secondary | ICD-10-CM | POA: Diagnosis not present

## 2016-07-18 DIAGNOSIS — J019 Acute sinusitis, unspecified: Secondary | ICD-10-CM | POA: Diagnosis not present

## 2016-07-19 DIAGNOSIS — M859 Disorder of bone density and structure, unspecified: Secondary | ICD-10-CM | POA: Diagnosis not present

## 2016-07-19 DIAGNOSIS — Z1231 Encounter for screening mammogram for malignant neoplasm of breast: Secondary | ICD-10-CM | POA: Diagnosis not present

## 2016-07-19 DIAGNOSIS — M810 Age-related osteoporosis without current pathological fracture: Secondary | ICD-10-CM | POA: Diagnosis not present

## 2016-07-21 DIAGNOSIS — R404 Transient alteration of awareness: Secondary | ICD-10-CM | POA: Diagnosis not present

## 2016-07-21 DIAGNOSIS — E860 Dehydration: Secondary | ICD-10-CM | POA: Diagnosis not present

## 2016-07-21 DIAGNOSIS — R531 Weakness: Secondary | ICD-10-CM | POA: Diagnosis not present

## 2016-07-21 DIAGNOSIS — R0602 Shortness of breath: Secondary | ICD-10-CM | POA: Diagnosis not present

## 2016-07-21 DIAGNOSIS — R05 Cough: Secondary | ICD-10-CM | POA: Diagnosis not present

## 2016-07-24 DIAGNOSIS — M5387 Other specified dorsopathies, lumbosacral region: Secondary | ICD-10-CM | POA: Diagnosis not present

## 2016-07-24 DIAGNOSIS — M9901 Segmental and somatic dysfunction of cervical region: Secondary | ICD-10-CM | POA: Diagnosis not present

## 2016-07-24 DIAGNOSIS — M5032 Other cervical disc degeneration, mid-cervical region, unspecified level: Secondary | ICD-10-CM | POA: Diagnosis not present

## 2016-07-24 DIAGNOSIS — M9902 Segmental and somatic dysfunction of thoracic region: Secondary | ICD-10-CM | POA: Diagnosis not present

## 2016-07-24 DIAGNOSIS — M9904 Segmental and somatic dysfunction of sacral region: Secondary | ICD-10-CM | POA: Diagnosis not present

## 2016-07-24 DIAGNOSIS — M542 Cervicalgia: Secondary | ICD-10-CM | POA: Diagnosis not present

## 2016-07-24 DIAGNOSIS — M9903 Segmental and somatic dysfunction of lumbar region: Secondary | ICD-10-CM | POA: Diagnosis not present

## 2016-07-24 DIAGNOSIS — S29019A Strain of muscle and tendon of unspecified wall of thorax, initial encounter: Secondary | ICD-10-CM | POA: Diagnosis not present

## 2016-07-25 DIAGNOSIS — E860 Dehydration: Secondary | ICD-10-CM | POA: Diagnosis not present

## 2016-07-25 DIAGNOSIS — M810 Age-related osteoporosis without current pathological fracture: Secondary | ICD-10-CM | POA: Diagnosis not present

## 2016-07-31 DIAGNOSIS — M2042 Other hammer toe(s) (acquired), left foot: Secondary | ICD-10-CM | POA: Diagnosis not present

## 2016-07-31 DIAGNOSIS — L600 Ingrowing nail: Secondary | ICD-10-CM | POA: Diagnosis not present

## 2016-07-31 DIAGNOSIS — M21612 Bunion of left foot: Secondary | ICD-10-CM | POA: Diagnosis not present

## 2016-08-01 DIAGNOSIS — M9903 Segmental and somatic dysfunction of lumbar region: Secondary | ICD-10-CM | POA: Diagnosis not present

## 2016-08-01 DIAGNOSIS — S29019A Strain of muscle and tendon of unspecified wall of thorax, initial encounter: Secondary | ICD-10-CM | POA: Diagnosis not present

## 2016-08-01 DIAGNOSIS — M9901 Segmental and somatic dysfunction of cervical region: Secondary | ICD-10-CM | POA: Diagnosis not present

## 2016-08-01 DIAGNOSIS — M9904 Segmental and somatic dysfunction of sacral region: Secondary | ICD-10-CM | POA: Diagnosis not present

## 2016-08-01 DIAGNOSIS — M5032 Other cervical disc degeneration, mid-cervical region, unspecified level: Secondary | ICD-10-CM | POA: Diagnosis not present

## 2016-08-01 DIAGNOSIS — M5387 Other specified dorsopathies, lumbosacral region: Secondary | ICD-10-CM | POA: Diagnosis not present

## 2016-08-01 DIAGNOSIS — M9902 Segmental and somatic dysfunction of thoracic region: Secondary | ICD-10-CM | POA: Diagnosis not present

## 2016-08-01 DIAGNOSIS — M542 Cervicalgia: Secondary | ICD-10-CM | POA: Diagnosis not present

## 2016-08-02 ENCOUNTER — Other Ambulatory Visit: Payer: Self-pay | Admitting: Orthopedic Surgery

## 2016-08-04 NOTE — Progress Notes (Signed)
Consult Note: Gyn-Onc  Kimberly Larsen 71 y.o. female  CC:  Chief Complaint  Patient presents with  . endometrial cancer    follow-up   Assessment/Plan:  71 year old with stage IA grade 1 endometrioid endometrial adenocarcinoma s/p surgical staging on 01/16/16.  Her endometrial biopsy (originally read as high grade serous) has been reviewed and felt to be more consistent with moderately differentiated endometrial cancer.  Therefore, given the dominant grade 1 disease in her hystectomy specimen, she was felt to have a low risk cancer.  Pathology revealed low risk factors for recurrence, therefore no adjuvant therapy was recommended according to NCCN guidelines.  I discussed risk for recurrence and typical symptoms encouraged her to notify us of these should they develop between visits.  I recommend she continue to have follow-up every 6 months for 5 years in accordance with NCCN guidelines. Those visits should include symptom assessment, physical exam and pelvic examination. Pap smears are not indicated or recommended in the routine surveillance of endometrial cancer. I recommend she return to see Dr Pamala Hurry in 6 months and myself in 12 months.  HPI: Patient was originally seen on 01/10/16 in consultation at the request of Dr. Pamala Hurry.   Patient is a very pleasant 71 year old gravida 1 para 1 who has a long history of renal stones and has been seen by urology. She presented to urology and was seen by Dr. Alinda Money on August 30 for complaints of gross hematuria and right lower quadrant pain. However at that time she was noted to have vaginal bleeding and her bladder catheterization revealed no blood within the bladder. She was seen by Dr. Valentino Saxon on 12/29/2015. At that time an endometrial biopsy was performed as well as a Pap smear. Pap smear was unremarkable. 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 Prior to this episode of bleeding she had not had any menses for  approximately 16 years.  Interval Hx:  On 01/16/16 she underwent robotic assisted total hysterectomy, BSO and pelvic lymphadenectomy. Final pathology revealed stage IA grade 1 endometrial cancer with inner half myometrial invasion and no LVSI. She was noted to have low risk factors of final pathology and therefore no adjuvant therapy was recommended in accordance with NCCN guidelines.  She presents today for routine follow-up.  Review of Systems  Constitutional:  Denies fever. Skin: No rash Cardiovascular: No chest pain, shortness of breath except as above, or edema  Pulmonary: No cough  Gastro Intestinal: Slight nausea, vomiting, constipation, or diarrhea reported. No change in bowel movement.  Genitourinary: No frequency, urgency, or dysuria.  + vaginal bleeding, no discharge.  Musculoskeletal: + Low back pain and joint pain in her knees.  Neurologic: No weakness, numbness, or change in gait.  Psychology: Anxious  Current Meds:  Outpatient Encounter Prescriptions as of 08/05/2016  Medication Sig  . alendronate (FOSAMAX) 70 MG tablet   . amLODipine (NORVASC) 10 MG tablet Take 1 tablet (10 mg total) by mouth daily. (Patient taking differently: Take 5 mg by mouth daily. )  . aspirin EC 81 MG tablet Take 81 mg by mouth daily.  . B Complex-C (SUPER B COMPLEX) TABS Take 1 tablet by mouth daily.  . DULoxetine (CYMBALTA) 60 MG capsule Take 60 mg by mouth daily.  . fluticasone (FLONASE) 50 MCG/ACT nasal spray Place 1 spray into both nostrils daily.  Marland Kitchen gabapentin (NEURONTIN) 600 MG tablet Take 600 mg by mouth 3 (three) times daily.  . hydrochlorothiazide (MICROZIDE) 12.5 MG capsule Take 2 capsules (25 mg total)  by mouth daily. (Patient taking differently: Take 12.5 mg by mouth daily. )  . Melatonin 10 MG CAPS Take 10 mg by mouth at bedtime.  . metoprolol succinate (TOPROL-XL) 25 MG 24 hr tablet Take 75 mg by mouth daily.   . Multiple Vitamin (MULTIVITAMIN WITH MINERALS) TABS Take 1 tablet by  mouth daily.  . Omega-3 Fatty Acids (FISH OIL) 600 MG CAPS Take 1 capsule by mouth 2 (two) times daily.  . ondansetron (ZOFRAN) 4 MG tablet Take 1 tablet (4 mg total) by mouth every 8 (eight) hours as needed for nausea or vomiting.  Marland Kitchen oxyCODONE-acetaminophen (PERCOCET) 10-325 MG per tablet Take 1 tablet by mouth every 6 (six) hours as needed for pain.  . pravastatin (PRAVACHOL) 40 MG tablet Take 1 tablet (40 mg total) by mouth at bedtime.  . senna (SENOKOT) 8.6 MG TABS tablet Take 1 tablet (8.6 mg total) by mouth at bedtime.  . traZODone (DESYREL) 100 MG tablet Take 100 mg by mouth at bedtime.  . vitamin C (ASCORBIC ACID) 500 MG tablet Take 500 mg by mouth daily.  . Zinc 50 MG CAPS Take 1 capsule (50 mg total) by mouth daily.  . [DISCONTINUED] potassium chloride 20 MEQ TBCR Take 20 mEq by mouth daily.   No facility-administered encounter medications on file as of 08/05/2016.     Allergy:  Allergies  Allergen Reactions  . Augmentin [Amoxicillin-Pot Clavulanate] Nausea Only  . Wellbutrin [Bupropion] Nausea Only    Social Hx:   Social History   Social History  . Marital status: Divorced    Spouse name: N/A  . Number of children: N/A  . Years of education: N/A   Occupational History  . Not on file.   Social History Main Topics  . Smoking status: Never Smoker  . Smokeless tobacco: Never Used  . Alcohol use No  . Drug use: No  . Sexual activity: Yes   Other Topics Concern  . Not on file   Social History Narrative  . No narrative on file    Past Surgical Hx:  Past Surgical History:  Procedure Laterality Date  . BACK SURGERY  2013  . CARPAL TUNNEL RELEASE     R hand  . CYSTOSCOPY WITH URETEROSCOPY AND STENT PLACEMENT Bilateral 03/25/2014   Procedure: CYSTOSCOPY WITH URETEROSCOPY AND STENT PLACEMENT;  Surgeon: Raynelle Bring, MD;  Location: [facility];  Service: Urology;  Laterality: Bilateral;  . CYSTOSCOPY WITH URETEROSCOPY AND STENT PLACEMENT Bilateral 04/18/2014    Procedure: CYSTOSCOPY WITH URETEROSCOPY AND STENT PLACEMENT,RETROGRADE;  Surgeon: Raynelle Bring, MD;  [facility];  Service: Urology;  Laterality: Bilateral;  . CYSTOSCOPY WITH URETEROSCOPY AND STENT PLACEMENT Right 05/30/2014   Procedure: CYSTOSCOPY WITH URETEROSCOPY AND STENT PLACEMENT;  Surgeon: Raynelle Bring, MD;  [facility];  Service: Urology;  Laterality: Right;  . CYSTOSCOPY/RETROGRADE/URETEROSCOPY Left 05/30/2014   Procedure: LEFT RETROGRADE;  Surgeon: Raynelle Bring, MD;  [facility];  Service: Urology;  Laterality: Left;  . EYE SURGERY     cataract surgery bil  . GANGLION CYST EXCISION     L ankle  . HOLMIUM LASER APPLICATION Bilateral 11/91/4782   Procedure: HOLMIUM LASER APPLICATION;  Surgeon: Raynelle Bring, MD;  [facility];  Service: Urology;  Laterality: Bilateral;  . JOINT REPLACEMENT     total knee  . LITHOTRIPSY    . LUMBAR FUSION  09/2011  . RHINOPLASTY    . ROBOTIC ASSISTED TOTAL HYSTERECTOMY WITH BILATERAL SALPINGO OOPHERECTOMY Bilateral 01/16/2016   Procedure: XI  ROBOTIC ASSISTED TOTAL HYSTERECTOMY WITH BILATERAL SALPINGO OOPHORECTOMY AND BILATERAL PELVIC LYMPH NODE DISSECTION;  Surgeon: Everitt Amber, MD;  Location[facility];  Service: Gynecology;  Laterality: Bilateral;  . SPINAL CORD STIMULATOR INSERTION N/A 02/23/2014   Procedure: SPINAL CORD STIMULATOR PLACEMENT ;  Surgeon: Melina Schools, MD;  Location: [place];  Service: Orthopedics;  Laterality: N/A;  . TONSILLECTOMY    . TOTAL KNEE ARTHROPLASTY Right 08/31/2012   Procedure: RIGHT TOTAL KNEE ARTHROPLASTY;  Surgeon: Mauri Pole, MD;  [facility];  Service: Orthopedics;  Laterality: Right;  with LMA  . TUBAL LIGATION      Past Medical Hx:  Past Medical History:  Diagnosis Date  . Anxiety   . Arthritis   . Back pain   . Cancer [facility])    endometrial cancer  . DDD (degenerative disc disease), lumbar   . Depression   . Gastroparesis   . History of kidney stones   . HLD (hyperlipidemia)   . Hx  of sepsis 02/2014   DUE TO MULTIPLE KIDNEY STONES  . Hypertension   . Osteoporosis   . Pneumonia    2014    Oncology Hx:    Endometrial cancer ([place])   12/29/2015 Initial Diagnosis    Endometrial cancer ([place])       Family Hx:  Family History  Problem Relation Age of Onset  . Stroke Mother   . Stroke Father     Vitals:  Blood pressure (!) 120/57, pulse 68, temperature 97.7 F (36.5 C), temperature source Oral, resp. rate 20, weight 184 lb 4.8 oz (83.6 kg).  Physical Exam: Well-nourished well-developed female in no acute distress.   Neck: Supple, no lymphadenopathy, no thyromegaly.  Lungs: Clear to auscultation.  Cardiac: Regular rate and rhythm.  Abdomen: Obese, soft, nontender, nondistended. No palpable masses. There is no rebound, no guarding. No fluid wave. There is no hepatosplenomegaly. Incisions are well healed.  Groins: No lymphadenopathy  Extremities: No edema  Pelvic: Normal female genitalia. The vagina is atrophic. Vaginal cuff healing normally. No blood or discharge. Cuff in tact.   Donaciano Eva, MD 08/05/2016, 10:21 AM

## 2016-08-05 ENCOUNTER — Encounter: Payer: Self-pay | Admitting: Gynecologic Oncology

## 2016-08-05 ENCOUNTER — Ambulatory Visit: Payer: Medicare Other | Attending: Gynecologic Oncology | Admitting: Gynecologic Oncology

## 2016-08-05 VITALS — BP 120/57 | HR 68 | Temp 97.7°F | Resp 20 | Wt 184.3 lb

## 2016-08-05 DIAGNOSIS — Z9851 Tubal ligation status: Secondary | ICD-10-CM | POA: Insufficient documentation

## 2016-08-05 DIAGNOSIS — I10 Essential (primary) hypertension: Secondary | ICD-10-CM | POA: Insufficient documentation

## 2016-08-05 DIAGNOSIS — Z79899 Other long term (current) drug therapy: Secondary | ICD-10-CM | POA: Diagnosis not present

## 2016-08-05 DIAGNOSIS — Z79891 Long term (current) use of opiate analgesic: Secondary | ICD-10-CM | POA: Diagnosis not present

## 2016-08-05 DIAGNOSIS — Z880 Allergy status to penicillin: Secondary | ICD-10-CM | POA: Diagnosis not present

## 2016-08-05 DIAGNOSIS — Z9889 Other specified postprocedural states: Secondary | ICD-10-CM | POA: Insufficient documentation

## 2016-08-05 DIAGNOSIS — Z8542 Personal history of malignant neoplasm of other parts of uterus: Secondary | ICD-10-CM | POA: Diagnosis not present

## 2016-08-05 DIAGNOSIS — Z90722 Acquired absence of ovaries, bilateral: Secondary | ICD-10-CM | POA: Diagnosis not present

## 2016-08-05 DIAGNOSIS — Z87442 Personal history of urinary calculi: Secondary | ICD-10-CM | POA: Diagnosis not present

## 2016-08-05 DIAGNOSIS — C541 Malignant neoplasm of endometrium: Secondary | ICD-10-CM

## 2016-08-05 DIAGNOSIS — Z7982 Long term (current) use of aspirin: Secondary | ICD-10-CM | POA: Insufficient documentation

## 2016-08-05 DIAGNOSIS — Z90710 Acquired absence of both cervix and uterus: Secondary | ICD-10-CM | POA: Diagnosis not present

## 2016-08-05 NOTE — Patient Instructions (Signed)
Follow up with Dr. Pamala Hurry in 6 months and Dr. Denman George in 1 year.

## 2016-08-08 ENCOUNTER — Other Ambulatory Visit: Payer: Self-pay | Admitting: Orthopedic Surgery

## 2016-08-16 DIAGNOSIS — M9904 Segmental and somatic dysfunction of sacral region: Secondary | ICD-10-CM | POA: Diagnosis not present

## 2016-08-16 DIAGNOSIS — M9902 Segmental and somatic dysfunction of thoracic region: Secondary | ICD-10-CM | POA: Diagnosis not present

## 2016-08-16 DIAGNOSIS — M9903 Segmental and somatic dysfunction of lumbar region: Secondary | ICD-10-CM | POA: Diagnosis not present

## 2016-08-16 DIAGNOSIS — M5032 Other cervical disc degeneration, mid-cervical region, unspecified level: Secondary | ICD-10-CM | POA: Diagnosis not present

## 2016-08-16 DIAGNOSIS — M9901 Segmental and somatic dysfunction of cervical region: Secondary | ICD-10-CM | POA: Diagnosis not present

## 2016-08-16 DIAGNOSIS — M542 Cervicalgia: Secondary | ICD-10-CM | POA: Diagnosis not present

## 2016-08-16 DIAGNOSIS — M5387 Other specified dorsopathies, lumbosacral region: Secondary | ICD-10-CM | POA: Diagnosis not present

## 2016-08-16 DIAGNOSIS — S29019A Strain of muscle and tendon of unspecified wall of thorax, initial encounter: Secondary | ICD-10-CM | POA: Diagnosis not present

## 2016-08-21 DIAGNOSIS — S81851A Open bite, right lower leg, initial encounter: Secondary | ICD-10-CM | POA: Diagnosis not present

## 2016-08-27 DIAGNOSIS — L03115 Cellulitis of right lower limb: Secondary | ICD-10-CM | POA: Diagnosis not present

## 2016-08-27 DIAGNOSIS — S81809D Unspecified open wound, unspecified lower leg, subsequent encounter: Secondary | ICD-10-CM | POA: Diagnosis not present

## 2016-08-29 ENCOUNTER — Encounter (HOSPITAL_BASED_OUTPATIENT_CLINIC_OR_DEPARTMENT_OTHER): Payer: Self-pay | Admitting: *Deleted

## 2016-09-02 ENCOUNTER — Encounter (HOSPITAL_BASED_OUTPATIENT_CLINIC_OR_DEPARTMENT_OTHER)
Admission: RE | Admit: 2016-09-02 | Discharge: 2016-09-02 | Disposition: A | Discharge status: Home / Self Care | Payer: Medicare Other | Source: Ambulatory Visit | Attending: Orthopedic Surgery | Admitting: Orthopedic Surgery

## 2016-09-02 DIAGNOSIS — Z7951 Long term (current) use of inhaled steroids: Secondary | ICD-10-CM | POA: Diagnosis not present

## 2016-09-02 DIAGNOSIS — E785 Hyperlipidemia, unspecified: Secondary | ICD-10-CM | POA: Diagnosis not present

## 2016-09-02 DIAGNOSIS — Z96651 Presence of right artificial knee joint: Secondary | ICD-10-CM | POA: Diagnosis not present

## 2016-09-02 DIAGNOSIS — F329 Major depressive disorder, single episode, unspecified: Secondary | ICD-10-CM | POA: Diagnosis not present

## 2016-09-02 DIAGNOSIS — M810 Age-related osteoporosis without current pathological fracture: Secondary | ICD-10-CM | POA: Diagnosis not present

## 2016-09-02 DIAGNOSIS — Z79899 Other long term (current) drug therapy: Secondary | ICD-10-CM | POA: Diagnosis not present

## 2016-09-02 DIAGNOSIS — M797 Fibromyalgia: Secondary | ICD-10-CM | POA: Diagnosis not present

## 2016-09-02 DIAGNOSIS — Z8542 Personal history of malignant neoplasm of other parts of uterus: Secondary | ICD-10-CM | POA: Diagnosis not present

## 2016-09-02 DIAGNOSIS — M21612 Bunion of left foot: Secondary | ICD-10-CM | POA: Diagnosis not present

## 2016-09-02 DIAGNOSIS — Z7982 Long term (current) use of aspirin: Secondary | ICD-10-CM | POA: Diagnosis not present

## 2016-09-02 DIAGNOSIS — M2042 Other hammer toe(s) (acquired), left foot: Secondary | ICD-10-CM | POA: Diagnosis not present

## 2016-09-02 DIAGNOSIS — F419 Anxiety disorder, unspecified: Secondary | ICD-10-CM | POA: Diagnosis not present

## 2016-09-02 DIAGNOSIS — I10 Essential (primary) hypertension: Secondary | ICD-10-CM | POA: Diagnosis not present

## 2016-09-02 DIAGNOSIS — M7742 Metatarsalgia, left foot: Secondary | ICD-10-CM | POA: Diagnosis not present

## 2016-09-02 LAB — BASIC METABOLIC PANEL
Anion gap: 9 (ref 5–15)
BUN: 12 mg/dL (ref 6–20)
CO2: 30 mmol/L (ref 22–32)
Calcium: 9.9 mg/dL (ref 8.9–10.3)
Chloride: 99 mmol/L — ABNORMAL LOW (ref 101–111)
Creatinine, Ser: 0.96 mg/dL (ref 0.44–1.00)
GFR calc Af Amer: >60 mL/min (ref >60)
GFR calc non Af Amer: 59 mL/min — ABNORMAL LOW (ref >60)
Glucose, Bld: 102 mg/dL — ABNORMAL HIGH (ref 65–99)
Potassium: 4.6 mmol/L (ref 3.5–5.1)
Sodium: 138 mmol/L (ref 135–145)

## 2016-09-02 NOTE — Progress Notes (Signed)
EKG performed after patient turned off spinal cord stimulator.  EKG WNL

## 2016-09-04 DIAGNOSIS — M9901 Segmental and somatic dysfunction of cervical region: Secondary | ICD-10-CM | POA: Diagnosis not present

## 2016-09-04 DIAGNOSIS — M542 Cervicalgia: Secondary | ICD-10-CM | POA: Diagnosis not present

## 2016-09-04 DIAGNOSIS — M5032 Other cervical disc degeneration, mid-cervical region, unspecified level: Secondary | ICD-10-CM | POA: Diagnosis not present

## 2016-09-04 DIAGNOSIS — M9902 Segmental and somatic dysfunction of thoracic region: Secondary | ICD-10-CM | POA: Diagnosis not present

## 2016-09-04 DIAGNOSIS — M9903 Segmental and somatic dysfunction of lumbar region: Secondary | ICD-10-CM | POA: Diagnosis not present

## 2016-09-04 DIAGNOSIS — M5387 Other specified dorsopathies, lumbosacral region: Secondary | ICD-10-CM | POA: Diagnosis not present

## 2016-09-04 DIAGNOSIS — M9904 Segmental and somatic dysfunction of sacral region: Secondary | ICD-10-CM | POA: Diagnosis not present

## 2016-09-04 DIAGNOSIS — S29019A Strain of muscle and tendon of unspecified wall of thorax, initial encounter: Secondary | ICD-10-CM | POA: Diagnosis not present

## 2016-09-05 ENCOUNTER — Ambulatory Visit (HOSPITAL_BASED_OUTPATIENT_CLINIC_OR_DEPARTMENT_OTHER)
Admission: RE | Admit: 2016-09-05 | Discharge: 2016-09-05 | Disposition: A | Discharge status: Home / Self Care | Payer: Medicare Other | Source: Ambulatory Visit | Attending: Orthopedic Surgery | Admitting: Orthopedic Surgery

## 2016-09-05 ENCOUNTER — Encounter (HOSPITAL_BASED_OUTPATIENT_CLINIC_OR_DEPARTMENT_OTHER): Payer: Self-pay | Admitting: Anesthesiology

## 2016-09-05 ENCOUNTER — Encounter (HOSPITAL_BASED_OUTPATIENT_CLINIC_OR_DEPARTMENT_OTHER)
Admission: RE | Disposition: A | Discharge status: Home / Self Care | Payer: Self-pay | Source: Ambulatory Visit | Attending: Orthopedic Surgery

## 2016-09-05 ENCOUNTER — Ambulatory Visit (HOSPITAL_BASED_OUTPATIENT_CLINIC_OR_DEPARTMENT_OTHER): Payer: Medicare Other | Admitting: Anesthesiology

## 2016-09-05 DIAGNOSIS — I10 Essential (primary) hypertension: Secondary | ICD-10-CM | POA: Insufficient documentation

## 2016-09-05 DIAGNOSIS — Z7951 Long term (current) use of inhaled steroids: Secondary | ICD-10-CM | POA: Insufficient documentation

## 2016-09-05 DIAGNOSIS — M216X2 Other acquired deformities of left foot: Secondary | ICD-10-CM | POA: Diagnosis not present

## 2016-09-05 DIAGNOSIS — F329 Major depressive disorder, single episode, unspecified: Secondary | ICD-10-CM | POA: Diagnosis not present

## 2016-09-05 DIAGNOSIS — Z7982 Long term (current) use of aspirin: Secondary | ICD-10-CM | POA: Diagnosis not present

## 2016-09-05 DIAGNOSIS — M21612 Bunion of left foot: Secondary | ICD-10-CM | POA: Insufficient documentation

## 2016-09-05 DIAGNOSIS — E785 Hyperlipidemia, unspecified: Secondary | ICD-10-CM | POA: Insufficient documentation

## 2016-09-05 DIAGNOSIS — Z8542 Personal history of malignant neoplasm of other parts of uterus: Secondary | ICD-10-CM | POA: Diagnosis not present

## 2016-09-05 DIAGNOSIS — Z9889 Other specified postprocedural states: Secondary | ICD-10-CM

## 2016-09-05 DIAGNOSIS — G8918 Other acute postprocedural pain: Secondary | ICD-10-CM | POA: Diagnosis not present

## 2016-09-05 DIAGNOSIS — M7742 Metatarsalgia, left foot: Secondary | ICD-10-CM | POA: Insufficient documentation

## 2016-09-05 DIAGNOSIS — F419 Anxiety disorder, unspecified: Secondary | ICD-10-CM | POA: Diagnosis not present

## 2016-09-05 DIAGNOSIS — Z79899 Other long term (current) drug therapy: Secondary | ICD-10-CM | POA: Insufficient documentation

## 2016-09-05 DIAGNOSIS — Z96651 Presence of right artificial knee joint: Secondary | ICD-10-CM | POA: Insufficient documentation

## 2016-09-05 DIAGNOSIS — M2042 Other hammer toe(s) (acquired), left foot: Secondary | ICD-10-CM | POA: Diagnosis not present

## 2016-09-05 DIAGNOSIS — M810 Age-related osteoporosis without current pathological fracture: Secondary | ICD-10-CM | POA: Diagnosis not present

## 2016-09-05 DIAGNOSIS — M797 Fibromyalgia: Secondary | ICD-10-CM | POA: Insufficient documentation

## 2016-09-05 HISTORY — PX: METATARSAL OSTEOTOMY WITH BUNIONECTOMY: SHX5662

## 2016-09-05 HISTORY — PX: HAMMERTOE RECONSTRUCTION WITH WEIL OSTEOTOMY: SHX5631

## 2016-09-05 SURGERY — BUNIONECTOMY, WITH METATARSAL OSTEOTOMY
Anesthesia: General | Site: Foot | Laterality: Left

## 2016-09-05 MED ORDER — SODIUM CHLORIDE 0.9 % IV SOLN: INTRAVENOUS | Status: DC

## 2016-09-05 MED ORDER — CHLORHEXIDINE GLUCONATE 4 % EX LIQD: 60.0000 mL | Freq: Once | CUTANEOUS | Status: DC

## 2016-09-05 MED ORDER — PROMETHAZINE HCL 25 MG/ML IJ SOLN: 6.2500 mg | INTRAMUSCULAR | Status: DC | PRN

## 2016-09-05 MED ORDER — CEFAZOLIN SODIUM-DEXTROSE 2-4 GM/100ML-% IV SOLN
2.0000 g | INTRAVENOUS | Status: AC
  Administered 2016-09-05: 2 g via INTRAVENOUS

## 2016-09-05 MED ORDER — PROPOFOL 10 MG/ML IV BOLUS
INTRAVENOUS | Status: DC | PRN
  Administered 2016-09-05: 50 mg via INTRAVENOUS
  Administered 2016-09-05: 150 mg via INTRAVENOUS

## 2016-09-05 MED ORDER — FENTANYL CITRATE (PF) 100 MCG/2ML IJ SOLN
INTRAMUSCULAR | Status: AC
  Filled 2016-09-05: qty 2

## 2016-09-05 MED ORDER — MIDAZOLAM HCL 2 MG/2ML IJ SOLN
INTRAMUSCULAR | Status: AC
  Filled 2016-09-05: qty 2

## 2016-09-05 MED ORDER — MIDAZOLAM HCL 2 MG/2ML IJ SOLN
1.0000 mg | INTRAMUSCULAR | Status: DC | PRN
  Administered 2016-09-05: 2 mg via INTRAVENOUS

## 2016-09-05 MED ORDER — BUPIVACAINE-EPINEPHRINE (PF) 0.5% -1:200000 IJ SOLN
INTRAMUSCULAR | Status: DC | PRN
  Administered 2016-09-05: 30 mL via PERINEURAL

## 2016-09-05 MED ORDER — DOCUSATE SODIUM 100 MG PO CAPS: 100.0000 mg | ORAL_CAPSULE | Freq: Two times a day (BID) | ORAL | 0 refills | Status: DC

## 2016-09-05 MED ORDER — FENTANYL CITRATE (PF) 100 MCG/2ML IJ SOLN
50.0000 ug | INTRAMUSCULAR | Status: DC | PRN
  Administered 2016-09-05: 50 ug via INTRAVENOUS

## 2016-09-05 MED ORDER — LACTATED RINGERS IV SOLN
INTRAVENOUS | Status: DC
  Administered 2016-09-05 (×2): via INTRAVENOUS

## 2016-09-05 MED ORDER — HYDROCODONE-ACETAMINOPHEN 5-325 MG PO TABS
1.0000 | ORAL_TABLET | Freq: Once | ORAL | Status: AC
  Administered 2016-09-05: 1 via ORAL

## 2016-09-05 MED ORDER — HYDROMORPHONE HCL 1 MG/ML IJ SOLN: 0.2500 mg | INTRAMUSCULAR | Status: DC | PRN

## 2016-09-05 MED ORDER — CEFAZOLIN SODIUM-DEXTROSE 2-4 GM/100ML-% IV SOLN
INTRAVENOUS | Status: AC
  Filled 2016-09-05: qty 100

## 2016-09-05 MED ORDER — SCOPOLAMINE 1 MG/3DAYS TD PT72: 1.0000 | MEDICATED_PATCH | Freq: Once | TRANSDERMAL | Status: DC | PRN

## 2016-09-05 MED ORDER — LIDOCAINE 2% (20 MG/ML) 5 ML SYRINGE
INTRAMUSCULAR | Status: DC | PRN
  Administered 2016-09-05: 20 mg via INTRAVENOUS

## 2016-09-05 MED ORDER — DEXAMETHASONE SODIUM PHOSPHATE 10 MG/ML IJ SOLN
INTRAMUSCULAR | Status: DC | PRN
  Administered 2016-09-05: 10 mg via INTRAVENOUS

## 2016-09-05 MED ORDER — HYDROCODONE-ACETAMINOPHEN 5-325 MG PO TABS
ORAL_TABLET | ORAL | Status: AC
  Filled 2016-09-05: qty 1

## 2016-09-05 MED ORDER — ONDANSETRON HCL 4 MG/2ML IJ SOLN
INTRAMUSCULAR | Status: DC | PRN
  Administered 2016-09-05: 4 mg via INTRAVENOUS

## 2016-09-05 MED ORDER — 0.9 % SODIUM CHLORIDE (POUR BTL) OPTIME
TOPICAL | Status: DC | PRN
  Administered 2016-09-05: 200 mL

## 2016-09-05 MED ORDER — OXYCODONE HCL ER 10 MG PO T12A: 10.0000 mg | EXTENDED_RELEASE_TABLET | Freq: Two times a day (BID) | ORAL | 0 refills | Status: DC

## 2016-09-05 SURGICAL SUPPLY — 82 items
BANDAGE ESMARK 6X9 LF (GAUZE/BANDAGES/DRESSINGS) ×1 IMPLANT
BIT DRILL 1.5X30 QC DISP (BIT) ×1 IMPLANT
BIT DRILL 1.8 CANN MAX VPC (BIT) ×1 IMPLANT
BLADE AVERAGE 25X9 (BLADE) IMPLANT
BLADE LONG MED 25X9 (BLADE) ×2 IMPLANT
BLADE MICRO SAGITTAL (BLADE) ×1 IMPLANT
BLADE OSC/SAG .038X5.5 CUT EDG (BLADE) IMPLANT
BLADE SURG 15 STRL LF DISP TIS (BLADE) ×3 IMPLANT
BLADE SURG 15 STRL SS (BLADE) ×4
BNDG CMPR 9X4 STRL LF SNTH (GAUZE/BANDAGES/DRESSINGS)
BNDG CMPR 9X6 STRL LF SNTH (GAUZE/BANDAGES/DRESSINGS)
BNDG COHESIVE 4X5 TAN STRL (GAUZE/BANDAGES/DRESSINGS) ×2 IMPLANT
BNDG COHESIVE 6X5 TAN STRL LF (GAUZE/BANDAGES/DRESSINGS) IMPLANT
BNDG CONFORM 2 STRL LF (GAUZE/BANDAGES/DRESSINGS) IMPLANT
BNDG CONFORM 3 STRL LF (GAUZE/BANDAGES/DRESSINGS) ×2 IMPLANT
BNDG ESMARK 4X9 LF (GAUZE/BANDAGES/DRESSINGS) IMPLANT
BNDG ESMARK 6X9 LF (GAUZE/BANDAGES/DRESSINGS)
CAP PIN PROTECTOR ORTHO WHT (CAP) IMPLANT
CHLORAPREP W/TINT 26ML (MISCELLANEOUS) ×2 IMPLANT
COVER BACK TABLE 60X90IN (DRAPES) ×2 IMPLANT
CUFF TOURNIQUET SINGLE 24IN (TOURNIQUET CUFF) IMPLANT
CUFF TOURNIQUET SINGLE 34IN LL (TOURNIQUET CUFF) ×1 IMPLANT
DRAPE EXTREMITY T 121X128X90 (DRAPE) ×2 IMPLANT
DRAPE OEC MINIVIEW 54X84 (DRAPES) ×2 IMPLANT
DRAPE U-SHAPE 47X51 STRL (DRAPES) ×2 IMPLANT
DRSG MEPITEL 4X7.2 (GAUZE/BANDAGES/DRESSINGS) ×2 IMPLANT
DRSG PAD ABDOMINAL 8X10 ST (GAUZE/BANDAGES/DRESSINGS) ×2 IMPLANT
ELECT REM PT RETURN 9FT ADLT (ELECTROSURGICAL) ×2
ELECTRODE REM PT RTRN 9FT ADLT (ELECTROSURGICAL) ×1 IMPLANT
GAUZE SPONGE 4X4 12PLY STRL (GAUZE/BANDAGES/DRESSINGS) ×2 IMPLANT
GLOVE BIO SURGEON STRL SZ8 (GLOVE) ×2 IMPLANT
GLOVE BIOGEL PI IND STRL 7.0 (GLOVE) IMPLANT
GLOVE BIOGEL PI IND STRL 8 (GLOVE) ×2 IMPLANT
GLOVE BIOGEL PI INDICATOR 7.0 (GLOVE) ×3
GLOVE BIOGEL PI INDICATOR 8 (GLOVE) ×2
GLOVE ECLIPSE 6.0 STRL STRAW (GLOVE) ×1 IMPLANT
GLOVE ECLIPSE 8.0 STRL XLNG CF (GLOVE) ×2 IMPLANT
GOWN STRL REUS W/ TWL LRG LVL3 (GOWN DISPOSABLE) ×1 IMPLANT
GOWN STRL REUS W/ TWL XL LVL3 (GOWN DISPOSABLE) ×2 IMPLANT
GOWN STRL REUS W/TWL LRG LVL3 (GOWN DISPOSABLE) ×4
GOWN STRL REUS W/TWL XL LVL3 (GOWN DISPOSABLE) ×4
GUIDEWIRE .08 (WIRE) IMPLANT
K-WIRE .054X4 (WIRE) ×1 IMPLANT
K-WIRE COCR 0.9X95 (WIRE) ×4
KWIRE COCR 0.9X95 (WIRE) IMPLANT
NDL HYPO 25X1 1.5 SAFETY (NEEDLE) IMPLANT
NEEDLE HYPO 22GX1.5 SAFETY (NEEDLE) IMPLANT
NEEDLE HYPO 25X1 1.5 SAFETY (NEEDLE) IMPLANT
NS IRRIG 1000ML POUR BTL (IV SOLUTION) ×2 IMPLANT
PACK BASIN DAY SURGERY FS (CUSTOM PROCEDURE TRAY) ×2 IMPLANT
PAD CAST 4YDX4 CTTN HI CHSV (CAST SUPPLIES) ×1 IMPLANT
PADDING CAST ABS 4INX4YD NS (CAST SUPPLIES)
PADDING CAST ABS COTTON 4X4 ST (CAST SUPPLIES) IMPLANT
PADDING CAST COTTON 4X4 STRL (CAST SUPPLIES) ×2
PASSER SUT SWANSON 36MM LOOP (INSTRUMENTS) IMPLANT
PENCIL BUTTON HOLSTER BLD 10FT (ELECTRODE) ×2 IMPLANT
SANITIZER HAND PURELL 535ML FO (MISCELLANEOUS) ×2 IMPLANT
SCREW 2.0 CORTICAL FT 20MM (Screw) ×1 IMPLANT
SCREW CORTICAL 2.0X16 (Screw) ×1 IMPLANT
SCREW HCS TWIST-OFF 2.0X14MM (Screw) ×1 IMPLANT
SCREW MAX VPC  2.5X20 (Screw) ×1 IMPLANT
SCREW MAX VPC 2.5X20 (Screw) IMPLANT
SHEET MEDIUM DRAPE 40X70 STRL (DRAPES) ×2 IMPLANT
SLEEVE SCD COMPRESS KNEE MED (MISCELLANEOUS) ×2 IMPLANT
SPONGE LAP 18X18 X RAY DECT (DISPOSABLE) ×2 IMPLANT
STOCKINETTE 6  STRL (DRAPES) ×1
STOCKINETTE 6 STRL (DRAPES) ×1 IMPLANT
SUCTION FRAZIER HANDLE 10FR (MISCELLANEOUS) ×1
SUCTION TUBE FRAZIER 10FR DISP (MISCELLANEOUS) ×1 IMPLANT
SUT ETHIBOND 3-0 V-5 (SUTURE) IMPLANT
SUT ETHILON 3 0 PS 1 (SUTURE) ×3 IMPLANT
SUT MNCRL AB 3-0 PS2 18 (SUTURE) ×2 IMPLANT
SUT VIC AB 0 SH 27 (SUTURE) IMPLANT
SUT VIC AB 2-0 SH 27 (SUTURE) ×2
SUT VIC AB 2-0 SH 27XBRD (SUTURE) IMPLANT
SUT VICRYL 0 UR6 27IN ABS (SUTURE) IMPLANT
SYR BULB 3OZ (MISCELLANEOUS) ×2 IMPLANT
SYR CONTROL 10ML LL (SYRINGE) IMPLANT
TOWEL OR 17X24 6PK STRL BLUE (TOWEL DISPOSABLE) ×2 IMPLANT
TUBE CONNECTING 20X1/4 (TUBING) ×2 IMPLANT
UNDERPAD 30X30 (UNDERPADS AND DIAPERS) ×2 IMPLANT
YANKAUER SUCT BULB TIP NO VENT (SUCTIONS) IMPLANT

## 2016-09-05 NOTE — H&P (Signed)
Kimberly Larsen is an 71 y.o. female.   Chief Complaint: left foot bunion and 2nd hammertoe HPI: 71 y/o female with left foot bunion and 2nd hammertoe.  She has failed non op treatment and presents today for surgery.  Past Medical History:  Diagnosis Date  . Anxiety   . Arthritis   . Back pain   . Cancer [facility])    endometrial cancer  . DDD (degenerative disc disease), lumbar   . Depression   . Gastroparesis   . History of kidney stones   . HLD (hyperlipidemia)   . Hx of sepsis 02/2014   DUE TO MULTIPLE KIDNEY STONES  . Hypertension   . Osteoporosis   . Pneumonia    2014    Past Surgical History:  Procedure Laterality Date  . BACK SURGERY  2013  . CARPAL TUNNEL RELEASE     R hand  . CYSTOSCOPY WITH URETEROSCOPY AND STENT PLACEMENT Bilateral 03/25/2014   Procedure: CYSTOSCOPY WITH URETEROSCOPY AND STENT PLACEMENT;  Surgeon: Raynelle Bring, MD;  [facility];  Service: Urology;  Laterality: Bilateral;  . CYSTOSCOPY WITH URETEROSCOPY AND STENT PLACEMENT Bilateral 04/18/2014   Procedure: CYSTOSCOPY WITH URETEROSCOPY AND STENT PLACEMENT,RETROGRADE;  Surgeon: Raynelle Bring, MD;  [facility];  Service: Urology;  Laterality: Bilateral;  . CYSTOSCOPY WITH URETEROSCOPY AND STENT PLACEMENT Right 05/30/2014   Procedure: CYSTOSCOPY WITH URETEROSCOPY AND STENT PLACEMENT;  Surgeon: Raynelle Bring, MD;  [facility];  Service: Urology;  Laterality: Right;  . CYSTOSCOPY/RETROGRADE/URETEROSCOPY Left 05/30/2014   Procedure: LEFT RETROGRADE;  Surgeon: Raynelle Bring, MD;  [facility];  Service: Urology;  Laterality: Left;  . EYE SURGERY     cataract surgery bil  . GANGLION CYST EXCISION     L ankle  . HOLMIUM LASER APPLICATION Bilateral 66/09/3014   Procedure: HOLMIUM LASER APPLICATION;  Surgeon: Raynelle Bring, MD;  [facility];  Service: Urology;  Laterality: Bilateral;  . JOINT REPLACEMENT     total knee  . LITHOTRIPSY    . LUMBAR FUSION  09/2011  . RHINOPLASTY    . ROBOTIC  ASSISTED TOTAL HYSTERECTOMY WITH BILATERAL SALPINGO OOPHERECTOMY Bilateral 01/16/2016   Procedure: XI ROBOTIC ASSISTED TOTAL HYSTERECTOMY WITH BILATERAL SALPINGO OOPHORECTOMY AND BILATERAL PELVIC LYMPH NODE DISSECTION;  Surgeon: Everitt Amber, MD;  [facility];  Service: Gynecology;  Laterality: Bilateral;  . SPINAL CORD STIMULATOR INSERTION N/A 02/23/2014   Procedure: SPINAL CORD STIMULATOR PLACEMENT ;  Surgeon: Melina Schools, MD;  Location: [place];  Service: Orthopedics;  Laterality: N/A;  . TONSILLECTOMY    . TOTAL KNEE ARTHROPLASTY Right 08/31/2012   Procedure: RIGHT TOTAL KNEE ARTHROPLASTY;  Surgeon: Mauri Pole, MD;  [facility];  Service: Orthopedics;  Laterality: Right;  with LMA  . TUBAL LIGATION      Family History  Problem Relation Age of Onset  . Stroke Mother   . Stroke Father    Social History:  reports that she has never smoked. She has never used smokeless tobacco. She reports that she does not drink alcohol or use drugs.  Allergies:  Allergies  Allergen Reactions  . Augmentin [Amoxicillin-Pot Clavulanate] Nausea Only  . Wellbutrin [Bupropion] Nausea Only    Medications Prior to Admission  Medication Sig Dispense Refill  . amLODipine (NORVASC) 10 MG tablet Take 1 tablet (10 mg total) by mouth daily. (Patient taking differently: Take 5 mg by mouth daily. ) 30 tablet 0  . amoxicillin-clavulanate (AUGMENTIN) 125-31.25 MG/5ML suspension Take by mouth 3 (three) times  daily.    . aspirin EC 81 MG tablet Take 81 mg by mouth daily.    . DULoxetine (CYMBALTA) 60 MG capsule Take 60 mg by mouth daily.    . fluticasone (FLONASE) 50 MCG/ACT nasal spray Place 1 spray into both nostrils daily.    Marland Kitchen gabapentin (NEURONTIN) 600 MG tablet Take 600 mg by mouth 3 (three) times daily.    . hydrochlorothiazide (MICROZIDE) 12.5 MG capsule Take 2 capsules (25 mg total) by mouth daily. (Patient taking differently: Take 12.5 mg by mouth daily. ) 60 capsule 0  . Melatonin 10 MG CAPS Take  10 mg by mouth at bedtime.    . metoprolol succinate (TOPROL-XL) 25 MG 24 hr tablet Take 75 mg by mouth daily.   0  . Multiple Vitamin (MULTIVITAMIN WITH MINERALS) TABS Take 1 tablet by mouth daily.    . Omega-3 Fatty Acids (FISH OIL) 600 MG CAPS Take 1 capsule by mouth 2 (two) times daily. 30 capsule   . oxyCODONE-acetaminophen (PERCOCET) 10-325 MG per tablet Take 1 tablet by mouth every 6 (six) hours as needed for pain. 30 tablet 0  . pravastatin (PRAVACHOL) 40 MG tablet Take 1 tablet (40 mg total) by mouth at bedtime. 30 tablet 0  . senna (SENOKOT) 8.6 MG TABS tablet Take 1 tablet (8.6 mg total) by mouth at bedtime. 120 each 0  . sulfamethoxazole-trimethoprim (BACTRIM DS,SEPTRA DS) 800-160 MG tablet Take 1 tablet by mouth 2 (two) times daily.    . traZODone (DESYREL) 100 MG tablet Take 100 mg by mouth at bedtime.      No results found for this or any previous visit (from the past 48 hour(s)). No results found.  ROS  No recent f/c/n/v/wt loss  Blood pressure 115/68, pulse 61, temperature 97.9 F (36.6 C), temperature source Oral, resp. rate 18, height 5\' 2"  (1.575 m), weight 82.1 kg (181 lb), SpO2 100 %. Physical Exam  [age] wd female in nad.  A and O x 4.  Mood and affect normal.  EOMI.  resp unlabored.  L foot with healthy skin and moderate bunion deformity.  Pulses are 2+.  5/5 strength in PF and DF of the toes and ankle.  Sens to LT intact at the forefoot.    Assessment/Plan L bunion and 2nd hammertoe - to OR for surgical correction.  The risks and benefits of the alternative treatment options have been discussed in detail.  The patient wishes to proceed with surgery and specifically understands risks of bleeding, infection, nerve damage, blood clots, need for additional surgery, amputation and death.   Wylene Simmer, MD 2016-09-24, 10:51 AM

## 2016-09-05 NOTE — Anesthesia Procedure Notes (Signed)
Anesthesia Regional Block: Popliteal block   Pre-Anesthetic Checklist: ,, timeout performed, Correct Patient, Correct Site, Correct Laterality, Correct Procedure, Correct Position, site marked, Risks and benefits discussed,  Surgical consent,  Pre-op evaluation,  At surgeon's request and post-op pain management  Laterality: Left  Prep: chloraprep       Needles:  Injection technique: Single-shot  Needle Type: Echogenic Needle     Needle Length: 9cm  Needle Gauge: 21     Additional Needles:   Procedures: ultrasound guided,,,,,,,,  Narrative:  Start time: 09/05/2016 10:22 AM End time: 09/05/2016 10:29 AM Injection made incrementally with aspirations every 5 mL.  Performed by: Personally  Anesthesiologist: Suzette Battiest

## 2016-09-05 NOTE — Discharge Instructions (Addendum)
Kimberly Simmer, MD [place]  Please read the following information regarding your care after surgery.  Medications  You only need a prescription for the narcotic pain medicine (ex. oxycodone, Percocet, Norco).  All of the other medicines listed below are available over the counter. X acetominophen (Tylenol) 650 mg every 4-6 hours as you need for minor pain X oxycodone as prescribed for moderate to severe pain X aleve 220mg  - 2 tablets twice daily as needed for pain.   Narcotic pain medicine (ex. oxycodone, Percocet, Vicodin) will cause constipation.  To prevent this problem, take the following medicines while you are taking any pain medicine. X docusate sodium (Colace) 100 mg twice a day X senna (Senokot) 2 tablets twice a day   Weight Bearing X Bear weight only on the heel of your operated foot in the post-op shoe.  Cast / Splint / Dressing X Keep your splint or cast clean and dry.  Dont put anything (coat hanger, pencil, etc) down inside of it.  If it gets damp, use a hair dryer on the cool setting to dry it.  If it gets soaked, call the office to schedule an appointment for a cast change.    After your dressing, cast or splint is removed; you may shower, but do not soak or scrub the wound.  Allow the water to run over it, and then gently pat it dry.  Swelling It is normal for you to have swelling where you had surgery.  To reduce swelling and pain, keep your toes above your nose for at least 3 days after surgery.  It may be necessary to keep your foot or leg elevated for several weeks.  If it hurts, it should be elevated.  Follow Up Call my office at [phone] when you are discharged from the hospital or surgery center to schedule an appointment to be seen two weeks after surgery.  Call my office at [phone] if you develop a fever >101.5 F, nausea, vomiting, bleeding from the surgical site or severe pain.    Regional Anesthesia Blocks  1. Numbness or the  inability to move the "blocked" extremity may last from 3-48 hours after placement. The length of time depends on the medication injected and your individual response to the medication. If the numbness is not going away after 48 hours, call your surgeon.  2. The extremity that is blocked will need to be protected until the numbness is gone and the  Strength has returned. Because you cannot feel it, you will need to take extra care to avoid injury. Because it may be weak, you may have difficulty moving it or using it. You may not know what position it is in without looking at it while the block is in effect.  3. For blocks in the legs and feet, returning to weight bearing and walking needs to be done carefully. You will need to wait until the numbness is entirely gone and the strength has returned. You should be able to move your leg and foot normally before you try and bear weight or walk. You will need someone to be with you when you first try to ensure you do not fall and possibly risk injury.  4. Bruising and tenderness at the needle site are common side effects and will resolve in a few days.  5. Persistent numbness or new problems with movement should be communicated to the surgeon or the [place] [phone] [place] [phone]).   Post  Anesthesia Home Care Instructions  Activity: Get plenty of rest for the remainder of the day. A responsible individual must stay with you for 24 hours following the procedure.  For the next 24 hours, DO NOT: -Drive a car -Paediatric nurse -Drink alcoholic beverages -Take any medication unless instructed by your physician -Make any legal decisions or sign important papers.  Meals: Start with liquid foods such as gelatin or soup. Progress to regular foods as tolerated. Avoid greasy, spicy, heavy foods. If nausea and/or vomiting occur, drink only clear liquids until the nausea and/or vomiting subsides. Call your  physician if vomiting continues.  Special Instructions/Symptoms: Your throat may feel dry or sore from the anesthesia or the breathing tube placed in your throat during surgery. If this causes discomfort, gargle with warm salt water. The discomfort should disappear within 24 hours.  If you had a scopolamine patch placed behind your ear for the management of post- operative nausea and/or vomiting:  1. The medication in the patch is effective for 72 hours, after which it should be removed.  Wrap patch in a tissue and discard in the trash. Wash hands thoroughly with soap and water. 2. You may remove the patch earlier than 72 hours if you experience unpleasant side effects which may include dry mouth, dizziness or visual disturbances. 3. Avoid touching the patch. Wash your hands with soap and water after contact with the patch.   Call your surgeon if you experience:   1.  Fever over 101.0. 2.  Inability to urinate. 3.  Nausea and/or vomiting. 4.  Extreme swelling or bruising at the surgical site. 5.  Continued bleeding from the incision. 6.  Increased pain, redness or drainage from the incision. 7.  Problems related to your pain medication. 8.  Any problems and/or concerns

## 2016-09-05 NOTE — Brief Op Note (Signed)
09/05/2016  12:31 PM  PATIENT:  DESPINA BOAN  71 y.o. female  PRE-OPERATIVE DIAGNOSIS: 1.  Left foot bunion      2.  Left 2nd hammertoe and metatarsalgia  POST-OPERATIVE DIAGNOSIS: Same  Procedure(s): 1.  Left foot modified McBride bunionectomy and chevron osteotomy 2.  Left second metatarsal Weil osteotomy (separate incision) 3.  Left 2nd Hammertoe Correction (separate incision) 4.  Left foot AP and lateral xrays  SURGEON:  Wylene Simmer, MD  ASSISTANT: Mechele Claude, PA-C  ANESTHESIA:   General, regional  EBL:  minimal   TOURNIQUET:   Total Tourniquet Time Documented: Thigh (Left) - 47 minutes Total: Thigh (Left) - 47 minutes  COMPLICATIONS:  None apparent  DISPOSITION:  Extubated, awake and stable to recovery.  DICTATION ID:  [id number]

## 2016-09-05 NOTE — Transfer of Care (Signed)
Immediate Anesthesia Transfer of Care Note  Patient: Kimberly Larsen  Procedure(s) Performed: Procedure(s): Left First Metatarsal Scarf Osteotomy, Modified McBride Bunion Correction (Left) Second Metatarsal Weil Osteotomy and Hammertoe Correction (Left)  Patient Location: PACU  Anesthesia Type:GA combined with regional for post-op pain  Level of Consciousness: awake, alert , oriented and patient cooperative  Airway & Oxygen Therapy: Patient Spontanous Breathing and Patient connected to face mask oxygen  Post-op Assessment: Report given to RN and Post -op Vital signs reviewed and stable  Post vital signs: Reviewed and stable  Last Vitals:  Vitals:   09/05/16 0959  BP: 115/68  Pulse: 61  Resp: 18  Temp: 36.6 C    Last Pain:  Vitals:   09/05/16 1004  TempSrc:   PainSc: 4       Patients Stated Pain Goal: 4 (23/30/07 6226)  Complications: No apparent anesthesia complications

## 2016-09-05 NOTE — Progress Notes (Signed)
Assisted Dr. Rob Fitzgerald with left, ultrasound guided, popliteal block. Side rails up, monitors on throughout procedure. See vital signs in flow sheet. Tolerated Procedure well. 

## 2016-09-05 NOTE — Anesthesia Procedure Notes (Signed)
Procedure Name: LMA Insertion Date/Time: 09/05/2016 11:21 AM Performed by: Lyndee Leo Pre-anesthesia Checklist: Patient identified, Emergency Drugs available, Suction available and Patient being monitored Patient Re-evaluated:Patient Re-evaluated prior to inductionOxygen Delivery Method: Circle system utilized Preoxygenation: Pre-oxygenation with 100% oxygen Intubation Type: IV induction Ventilation: Mask ventilation without difficulty LMA: LMA inserted LMA Size: 4.0 Number of attempts: 1 Airway Equipment and Method: Bite block Placement Confirmation: positive ETCO2 Tube secured with: Tape Dental Injury: Teeth and Oropharynx as per pre-operative assessment

## 2016-09-05 NOTE — Anesthesia Preprocedure Evaluation (Addendum)
Anesthesia Evaluation  Patient identified by MRN, date of birth, ID band Patient awake    Reviewed: Allergy & Precautions, NPO status , Patient's Chart, lab work & pertinent test results, reviewed documented beta blocker date and time   Airway Mallampati: II  TM Distance: >3 FB Neck ROM: Full    Dental   Pulmonary neg pulmonary ROS,    breath sounds clear to auscultation       Cardiovascular hypertension, Pt. on medications and Pt. on home beta blockers  Rhythm:Regular Rate:Normal     Neuro/Psych Anxiety negative neurological ROS     GI/Hepatic negative GI ROS, Neg liver ROS,   Endo/Other  negative endocrine ROS  Renal/GU negative Renal ROS     Musculoskeletal  (+) Arthritis , Fibromyalgia -  Abdominal   Peds  Hematology negative hematology ROS (+)   Anesthesia Other Findings   Reproductive/Obstetrics                            Anesthesia Physical Anesthesia Plan  ASA: II  Anesthesia Plan: General   Post-op Pain Management:  Regional for Post-op pain   Induction: Intravenous  Airway Management Planned: LMA  Additional Equipment:   Intra-op Plan:   Post-operative Plan: Extubation in OR  Informed Consent: I have reviewed the patients History and Physical, chart, labs and discussed the procedure including the risks, benefits and alternatives for the proposed anesthesia with the patient or authorized representative who has indicated his/her understanding and acceptance.   Dental advisory given  Plan Discussed with:   Anesthesia Plan Comments:         Anesthesia Quick Evaluation

## 2016-09-05 NOTE — Anesthesia Postprocedure Evaluation (Signed)
Anesthesia Post Note  Patient: Kimberly Larsen  Procedure(s) Performed: Procedure(s) (LRB): Left First Metatarsal Scarf Osteotomy, Modified McBride Bunion Correction (Left) Second Metatarsal Weil Osteotomy and Hammertoe Correction (Left)  Patient location during evaluation: PACU Anesthesia Type: General Level of consciousness: awake and alert Pain management: pain level controlled Vital Signs Assessment: post-procedure vital signs reviewed and stable Respiratory status: spontaneous breathing, nonlabored ventilation, respiratory function stable and patient connected to nasal cannula oxygen Cardiovascular status: blood pressure returned to baseline and stable Postop Assessment: no signs of nausea or vomiting Anesthetic complications: no       Last Vitals:  Vitals:   09/05/16 1315 09/05/16 1345  BP: (!) 122/58 134/68  Pulse: (!) 59 (!) 59  Resp: (!) 9 16  Temp:  36.5 C    Last Pain:  Vitals:   09/05/16 1345  TempSrc:   PainSc: 4                  Tiajuana Amass

## 2016-09-06 ENCOUNTER — Encounter (HOSPITAL_BASED_OUTPATIENT_CLINIC_OR_DEPARTMENT_OTHER): Payer: Self-pay | Admitting: Orthopedic Surgery

## 2016-09-06 NOTE — Op Note (Signed)
NAME:  Kimberly Larsen, Kimberly Larsen NO.:  [id number]  MEDICAL RECORD NO.:  [id number]  LOCATION:                                 FACILITY:  PHYSICIAN:  Wylene Simmer, MD             DATE OF BIRTH:  DATE OF PROCEDURE:  09/05/2016 DATE OF DISCHARGE:                              OPERATIVE REPORT   PREOPERATIVE DIAGNOSES: 1. Left foot painful bunion deformity. 2. Left second hammertoe and metatarsalgia.  POSTOPERATIVE DIAGNOSES: 1. Left foot painful bunion deformity. 2. Left second hammertoe and metatarsalgia.  PROCEDURES: 1. Left foot modified McBride bunionectomy and chevron osteotomy of     the first metatarsal. 2. Left second metatarsal Weil osteotomy through a separate incision. 3. Left second hammertoe correction through a separate incision. 4. Left foot AP and lateral radiographs.  SURGEON:  Wylene Simmer, MD  ASSISTANT:  Mechele Claude, PA-C.  ANESTHESIA:  General, regional.  ESTIMATED BLOOD LOSS:  Minimal.  TOURNIQUET TIME:  47 minutes at 250 mmHg.  COMPLICATIONS:  None apparent.  DISPOSITION:  Extubated, awake and stable to recovery.  INDICATIONS FOR PROCEDURE:  The patient is a 71 year old woman who has a history of painful left foot bunion deformity.  She has failed nonoperative treatment to date and presents today for surgical correction.  She understands the risks and benefits, the alternative treatment options and elects surgical treatment.  She specifically understands risks of bleeding, infection, nerve damage, blood clots, need for additional surgery, continued pain, nonunion, recurrence of her deformity, amputation and death.  PROCEDURE IN DETAIL:  After preoperative consent was obtained and the correct operative site was identified, the patient was brought to the operating room and placed supine on the operating table.  General anesthesia was induced.  Preoperative antibiotics were administered. Surgical time-out was taken.  Left lower extremity  was prepped and draped in standard sterile fashion with a tourniquet around the thigh. Extremity was exsanguinated and tourniquet was inflated to 250 mmHg.  A curvilinear incision was made at the dorsum of the first webspace. Sharp dissection was carried down through the skin subcutaneous tissue. The intermetatarsal ligament was divided under direct vision.  An arthrotomy was then made between the lateral sesamoid and the metatarsal head.  Perforations were made in the lateral joint capsule and the hallux could then be positioned in 20 degrees of varus passively.  Attention was then turned to the medial forefoot where a longitudinal incision was made.  Dissection was carried down through the skin and subcutaneous tissue.  The medial joint capsule was incised in line with its fibers and elevated plantarly and dorsally.  The oscillating saw was used to resect the hypertrophic medial eminence in line with first metatarsal shaft.  A Chevron osteotomy was then made in the metatarsal head with a long plantar limb.  The osteotomy was mobilized and the head of the metatarsal was translated laterally to correct the intermetatarsal and hallux valgus angles.  The osteotomy was held with a tenaculum and fixed with two 2-mm Biomet mini-frag screws.  The overhanging bone medially was then trimmed with the oscillating saw.  Attention was then turned to the  second metatarsal where a Weil osteotomy was made with the oscillating saw.  A wedge of bone was removed distally and the head of the metatarsal was allowed to retract several millimeters proximally.  The osteotomy was fixed with a 2-mm Biomet FRS screw.  A transverse incision was then made over the PIP joint of the second toe.  Dissection was carried down through the skin, subcutaneous tissue and extensor mechanism.  The head of the proximal phalanx and base of the middle phalanx were resected with the oscillating saw.  The arthrodesis site was  then fixed with a 2.5-mm Biomet VPC screw.  AP and lateral radiographs of the forefoot showed appropriate position and length of all hardware and appropriate correction of the bunion and hammertoe deformities.  Wounds were irrigated copiously.  The extensor tendons were lengthened at the second toe and the dorsal joint capsule was excised to allow appropriate alignment of the toe.  The extensor tendon was repaired with Monocryl.  Subcutaneous tissues were approximated with Monocryl.  The medial joint capsule was closed with imbricating sutures of 2-0 Vicryl.  Subcutaneous tissues were closed with Monocryl.  The skin incisions were all closed with nylon.  Sterile dressings were applied followed by a bunion wrap.  Tourniquet was released after application of the dressings at 47 minutes.  The patient was awakened from anesthesia and transported to the recovery room in stable condition.  FOLLOWUP PLAN:  The patient will be weightbearing as tolerated on her heel in a Darco Wedge-style shoe.  She will follow up with me in the office in 2 weeks for suture removal and conversion to a toe spacer.  Mechele Claude, PA-C, was present and scrubbed for the duration of the case.  His assistance was essential in positioning the patient, prepping and draping, gaining, maintaining exposure, performing the operation, and closing and dressing of the wounds.  RADIOGRAPHS:  AP and lateral radiographs of the left foot show interval correction of bunion and second hammertoe deformities.  Hardware is appropriately positioned and of the appropriate length.  No other acute injuries are noted.     Wylene Simmer, MD     JH/MEDQ  D:  09/05/2016  T:  09/06/2016  Job:  [id number]

## 2016-09-19 DIAGNOSIS — M2042 Other hammer toe(s) (acquired), left foot: Secondary | ICD-10-CM | POA: Diagnosis not present

## 2016-09-19 DIAGNOSIS — M21612 Bunion of left foot: Secondary | ICD-10-CM | POA: Diagnosis not present

## 2016-09-19 DIAGNOSIS — Z4789 Encounter for other orthopedic aftercare: Secondary | ICD-10-CM | POA: Diagnosis not present

## 2016-09-27 DIAGNOSIS — Z79891 Long term (current) use of opiate analgesic: Secondary | ICD-10-CM | POA: Diagnosis not present

## 2016-09-27 DIAGNOSIS — M961 Postlaminectomy syndrome, not elsewhere classified: Secondary | ICD-10-CM | POA: Diagnosis not present

## 2016-09-27 DIAGNOSIS — G894 Chronic pain syndrome: Secondary | ICD-10-CM | POA: Diagnosis not present

## 2016-10-18 DIAGNOSIS — M2042 Other hammer toe(s) (acquired), left foot: Secondary | ICD-10-CM | POA: Diagnosis not present

## 2016-10-18 DIAGNOSIS — Z4789 Encounter for other orthopedic aftercare: Secondary | ICD-10-CM | POA: Diagnosis not present

## 2016-11-11 DIAGNOSIS — Z4789 Encounter for other orthopedic aftercare: Secondary | ICD-10-CM | POA: Diagnosis not present

## 2016-11-11 DIAGNOSIS — M2042 Other hammer toe(s) (acquired), left foot: Secondary | ICD-10-CM | POA: Diagnosis not present

## 2016-11-12 DIAGNOSIS — M50123 Cervical disc disorder at C6-C7 level with radiculopathy: Secondary | ICD-10-CM | POA: Diagnosis not present

## 2016-11-18 DIAGNOSIS — M9902 Segmental and somatic dysfunction of thoracic region: Secondary | ICD-10-CM | POA: Diagnosis not present

## 2016-11-18 DIAGNOSIS — S29019A Strain of muscle and tendon of unspecified wall of thorax, initial encounter: Secondary | ICD-10-CM | POA: Diagnosis not present

## 2016-11-18 DIAGNOSIS — M9904 Segmental and somatic dysfunction of sacral region: Secondary | ICD-10-CM | POA: Diagnosis not present

## 2016-11-18 DIAGNOSIS — M9903 Segmental and somatic dysfunction of lumbar region: Secondary | ICD-10-CM | POA: Diagnosis not present

## 2016-11-18 DIAGNOSIS — M5387 Other specified dorsopathies, lumbosacral region: Secondary | ICD-10-CM | POA: Diagnosis not present

## 2016-11-18 DIAGNOSIS — M542 Cervicalgia: Secondary | ICD-10-CM | POA: Diagnosis not present

## 2016-11-18 DIAGNOSIS — M9901 Segmental and somatic dysfunction of cervical region: Secondary | ICD-10-CM | POA: Diagnosis not present

## 2016-11-18 DIAGNOSIS — M5032 Other cervical disc degeneration, mid-cervical region, unspecified level: Secondary | ICD-10-CM | POA: Diagnosis not present

## 2016-11-20 DIAGNOSIS — M9901 Segmental and somatic dysfunction of cervical region: Secondary | ICD-10-CM | POA: Diagnosis not present

## 2016-11-20 DIAGNOSIS — M9903 Segmental and somatic dysfunction of lumbar region: Secondary | ICD-10-CM | POA: Diagnosis not present

## 2016-11-20 DIAGNOSIS — M5387 Other specified dorsopathies, lumbosacral region: Secondary | ICD-10-CM | POA: Diagnosis not present

## 2016-11-20 DIAGNOSIS — S29019A Strain of muscle and tendon of unspecified wall of thorax, initial encounter: Secondary | ICD-10-CM | POA: Diagnosis not present

## 2016-11-20 DIAGNOSIS — M9902 Segmental and somatic dysfunction of thoracic region: Secondary | ICD-10-CM | POA: Diagnosis not present

## 2016-11-20 DIAGNOSIS — M5032 Other cervical disc degeneration, mid-cervical region, unspecified level: Secondary | ICD-10-CM | POA: Diagnosis not present

## 2016-11-20 DIAGNOSIS — M9904 Segmental and somatic dysfunction of sacral region: Secondary | ICD-10-CM | POA: Diagnosis not present

## 2016-11-20 DIAGNOSIS — M542 Cervicalgia: Secondary | ICD-10-CM | POA: Diagnosis not present

## 2016-11-21 DIAGNOSIS — M5387 Other specified dorsopathies, lumbosacral region: Secondary | ICD-10-CM | POA: Diagnosis not present

## 2016-11-21 DIAGNOSIS — S29019A Strain of muscle and tendon of unspecified wall of thorax, initial encounter: Secondary | ICD-10-CM | POA: Diagnosis not present

## 2016-11-21 DIAGNOSIS — M5032 Other cervical disc degeneration, mid-cervical region, unspecified level: Secondary | ICD-10-CM | POA: Diagnosis not present

## 2016-11-21 DIAGNOSIS — M9902 Segmental and somatic dysfunction of thoracic region: Secondary | ICD-10-CM | POA: Diagnosis not present

## 2016-11-21 DIAGNOSIS — M542 Cervicalgia: Secondary | ICD-10-CM | POA: Diagnosis not present

## 2016-11-21 DIAGNOSIS — M9901 Segmental and somatic dysfunction of cervical region: Secondary | ICD-10-CM | POA: Diagnosis not present

## 2016-11-21 DIAGNOSIS — M9904 Segmental and somatic dysfunction of sacral region: Secondary | ICD-10-CM | POA: Diagnosis not present

## 2016-11-21 DIAGNOSIS — M9903 Segmental and somatic dysfunction of lumbar region: Secondary | ICD-10-CM | POA: Diagnosis not present

## 2016-11-25 DIAGNOSIS — M9902 Segmental and somatic dysfunction of thoracic region: Secondary | ICD-10-CM | POA: Diagnosis not present

## 2016-11-25 DIAGNOSIS — M9903 Segmental and somatic dysfunction of lumbar region: Secondary | ICD-10-CM | POA: Diagnosis not present

## 2016-11-25 DIAGNOSIS — M9901 Segmental and somatic dysfunction of cervical region: Secondary | ICD-10-CM | POA: Diagnosis not present

## 2016-11-25 DIAGNOSIS — M9904 Segmental and somatic dysfunction of sacral region: Secondary | ICD-10-CM | POA: Diagnosis not present

## 2016-11-25 DIAGNOSIS — M542 Cervicalgia: Secondary | ICD-10-CM | POA: Diagnosis not present

## 2016-11-25 DIAGNOSIS — M5032 Other cervical disc degeneration, mid-cervical region, unspecified level: Secondary | ICD-10-CM | POA: Diagnosis not present

## 2016-11-25 DIAGNOSIS — M5387 Other specified dorsopathies, lumbosacral region: Secondary | ICD-10-CM | POA: Diagnosis not present

## 2016-11-25 DIAGNOSIS — S29019A Strain of muscle and tendon of unspecified wall of thorax, initial encounter: Secondary | ICD-10-CM | POA: Diagnosis not present

## 2016-11-27 DIAGNOSIS — M9903 Segmental and somatic dysfunction of lumbar region: Secondary | ICD-10-CM | POA: Diagnosis not present

## 2016-11-27 DIAGNOSIS — S29019A Strain of muscle and tendon of unspecified wall of thorax, initial encounter: Secondary | ICD-10-CM | POA: Diagnosis not present

## 2016-11-27 DIAGNOSIS — M5032 Other cervical disc degeneration, mid-cervical region, unspecified level: Secondary | ICD-10-CM | POA: Diagnosis not present

## 2016-11-27 DIAGNOSIS — M9901 Segmental and somatic dysfunction of cervical region: Secondary | ICD-10-CM | POA: Diagnosis not present

## 2016-11-27 DIAGNOSIS — M5387 Other specified dorsopathies, lumbosacral region: Secondary | ICD-10-CM | POA: Diagnosis not present

## 2016-11-27 DIAGNOSIS — M9902 Segmental and somatic dysfunction of thoracic region: Secondary | ICD-10-CM | POA: Diagnosis not present

## 2016-11-27 DIAGNOSIS — M9904 Segmental and somatic dysfunction of sacral region: Secondary | ICD-10-CM | POA: Diagnosis not present

## 2016-11-27 DIAGNOSIS — M542 Cervicalgia: Secondary | ICD-10-CM | POA: Diagnosis not present

## 2016-11-28 DIAGNOSIS — M9904 Segmental and somatic dysfunction of sacral region: Secondary | ICD-10-CM | POA: Diagnosis not present

## 2016-11-28 DIAGNOSIS — M5032 Other cervical disc degeneration, mid-cervical region, unspecified level: Secondary | ICD-10-CM | POA: Diagnosis not present

## 2016-11-28 DIAGNOSIS — S29019A Strain of muscle and tendon of unspecified wall of thorax, initial encounter: Secondary | ICD-10-CM | POA: Diagnosis not present

## 2016-11-28 DIAGNOSIS — M5387 Other specified dorsopathies, lumbosacral region: Secondary | ICD-10-CM | POA: Diagnosis not present

## 2016-11-28 DIAGNOSIS — M542 Cervicalgia: Secondary | ICD-10-CM | POA: Diagnosis not present

## 2016-11-28 DIAGNOSIS — M9902 Segmental and somatic dysfunction of thoracic region: Secondary | ICD-10-CM | POA: Diagnosis not present

## 2016-11-28 DIAGNOSIS — M9901 Segmental and somatic dysfunction of cervical region: Secondary | ICD-10-CM | POA: Diagnosis not present

## 2016-11-28 DIAGNOSIS — M9903 Segmental and somatic dysfunction of lumbar region: Secondary | ICD-10-CM | POA: Diagnosis not present

## 2016-12-05 DIAGNOSIS — M9904 Segmental and somatic dysfunction of sacral region: Secondary | ICD-10-CM | POA: Diagnosis not present

## 2016-12-05 DIAGNOSIS — S29019A Strain of muscle and tendon of unspecified wall of thorax, initial encounter: Secondary | ICD-10-CM | POA: Diagnosis not present

## 2016-12-05 DIAGNOSIS — M5032 Other cervical disc degeneration, mid-cervical region, unspecified level: Secondary | ICD-10-CM | POA: Diagnosis not present

## 2016-12-05 DIAGNOSIS — M5387 Other specified dorsopathies, lumbosacral region: Secondary | ICD-10-CM | POA: Diagnosis not present

## 2016-12-05 DIAGNOSIS — M9902 Segmental and somatic dysfunction of thoracic region: Secondary | ICD-10-CM | POA: Diagnosis not present

## 2016-12-05 DIAGNOSIS — M9903 Segmental and somatic dysfunction of lumbar region: Secondary | ICD-10-CM | POA: Diagnosis not present

## 2016-12-05 DIAGNOSIS — M9901 Segmental and somatic dysfunction of cervical region: Secondary | ICD-10-CM | POA: Diagnosis not present

## 2016-12-05 DIAGNOSIS — M542 Cervicalgia: Secondary | ICD-10-CM | POA: Diagnosis not present

## 2017-01-06 DIAGNOSIS — M5136 Other intervertebral disc degeneration, lumbar region: Secondary | ICD-10-CM | POA: Diagnosis not present

## 2017-01-06 DIAGNOSIS — M961 Postlaminectomy syndrome, not elsewhere classified: Secondary | ICD-10-CM | POA: Diagnosis not present

## 2017-01-06 DIAGNOSIS — G894 Chronic pain syndrome: Secondary | ICD-10-CM | POA: Diagnosis not present

## 2017-01-09 DIAGNOSIS — M9901 Segmental and somatic dysfunction of cervical region: Secondary | ICD-10-CM | POA: Diagnosis not present

## 2017-01-09 DIAGNOSIS — M9904 Segmental and somatic dysfunction of sacral region: Secondary | ICD-10-CM | POA: Diagnosis not present

## 2017-01-09 DIAGNOSIS — M9903 Segmental and somatic dysfunction of lumbar region: Secondary | ICD-10-CM | POA: Diagnosis not present

## 2017-01-09 DIAGNOSIS — M5387 Other specified dorsopathies, lumbosacral region: Secondary | ICD-10-CM | POA: Diagnosis not present

## 2017-01-09 DIAGNOSIS — M9902 Segmental and somatic dysfunction of thoracic region: Secondary | ICD-10-CM | POA: Diagnosis not present

## 2017-01-09 DIAGNOSIS — M5032 Other cervical disc degeneration, mid-cervical region, unspecified level: Secondary | ICD-10-CM | POA: Diagnosis not present

## 2017-01-09 DIAGNOSIS — S29019A Strain of muscle and tendon of unspecified wall of thorax, initial encounter: Secondary | ICD-10-CM | POA: Diagnosis not present

## 2017-01-09 DIAGNOSIS — M542 Cervicalgia: Secondary | ICD-10-CM | POA: Diagnosis not present

## 2017-01-15 DIAGNOSIS — M5387 Other specified dorsopathies, lumbosacral region: Secondary | ICD-10-CM | POA: Diagnosis not present

## 2017-01-15 DIAGNOSIS — M9904 Segmental and somatic dysfunction of sacral region: Secondary | ICD-10-CM | POA: Diagnosis not present

## 2017-01-15 DIAGNOSIS — M5032 Other cervical disc degeneration, mid-cervical region, unspecified level: Secondary | ICD-10-CM | POA: Diagnosis not present

## 2017-01-15 DIAGNOSIS — M9903 Segmental and somatic dysfunction of lumbar region: Secondary | ICD-10-CM | POA: Diagnosis not present

## 2017-01-15 DIAGNOSIS — M9902 Segmental and somatic dysfunction of thoracic region: Secondary | ICD-10-CM | POA: Diagnosis not present

## 2017-01-15 DIAGNOSIS — S29019A Strain of muscle and tendon of unspecified wall of thorax, initial encounter: Secondary | ICD-10-CM | POA: Diagnosis not present

## 2017-01-15 DIAGNOSIS — M542 Cervicalgia: Secondary | ICD-10-CM | POA: Diagnosis not present

## 2017-01-15 DIAGNOSIS — M9901 Segmental and somatic dysfunction of cervical region: Secondary | ICD-10-CM | POA: Diagnosis not present

## 2017-01-16 DIAGNOSIS — F329 Major depressive disorder, single episode, unspecified: Secondary | ICD-10-CM | POA: Diagnosis not present

## 2017-01-16 DIAGNOSIS — E7800 Pure hypercholesterolemia, unspecified: Secondary | ICD-10-CM | POA: Diagnosis not present

## 2017-01-16 DIAGNOSIS — I10 Essential (primary) hypertension: Secondary | ICD-10-CM | POA: Diagnosis not present

## 2017-01-17 DIAGNOSIS — M9904 Segmental and somatic dysfunction of sacral region: Secondary | ICD-10-CM | POA: Diagnosis not present

## 2017-01-17 DIAGNOSIS — M542 Cervicalgia: Secondary | ICD-10-CM | POA: Diagnosis not present

## 2017-01-17 DIAGNOSIS — M9903 Segmental and somatic dysfunction of lumbar region: Secondary | ICD-10-CM | POA: Diagnosis not present

## 2017-01-17 DIAGNOSIS — M9902 Segmental and somatic dysfunction of thoracic region: Secondary | ICD-10-CM | POA: Diagnosis not present

## 2017-01-17 DIAGNOSIS — M9901 Segmental and somatic dysfunction of cervical region: Secondary | ICD-10-CM | POA: Diagnosis not present

## 2017-01-17 DIAGNOSIS — M5032 Other cervical disc degeneration, mid-cervical region, unspecified level: Secondary | ICD-10-CM | POA: Diagnosis not present

## 2017-01-17 DIAGNOSIS — S29019A Strain of muscle and tendon of unspecified wall of thorax, initial encounter: Secondary | ICD-10-CM | POA: Diagnosis not present

## 2017-01-17 DIAGNOSIS — M5387 Other specified dorsopathies, lumbosacral region: Secondary | ICD-10-CM | POA: Diagnosis not present

## 2017-01-20 DIAGNOSIS — M9903 Segmental and somatic dysfunction of lumbar region: Secondary | ICD-10-CM | POA: Diagnosis not present

## 2017-01-20 DIAGNOSIS — S29019A Strain of muscle and tendon of unspecified wall of thorax, initial encounter: Secondary | ICD-10-CM | POA: Diagnosis not present

## 2017-01-20 DIAGNOSIS — M9904 Segmental and somatic dysfunction of sacral region: Secondary | ICD-10-CM | POA: Diagnosis not present

## 2017-01-20 DIAGNOSIS — M9902 Segmental and somatic dysfunction of thoracic region: Secondary | ICD-10-CM | POA: Diagnosis not present

## 2017-01-20 DIAGNOSIS — M5032 Other cervical disc degeneration, mid-cervical region, unspecified level: Secondary | ICD-10-CM | POA: Diagnosis not present

## 2017-01-20 DIAGNOSIS — M9901 Segmental and somatic dysfunction of cervical region: Secondary | ICD-10-CM | POA: Diagnosis not present

## 2017-01-20 DIAGNOSIS — M542 Cervicalgia: Secondary | ICD-10-CM | POA: Diagnosis not present

## 2017-01-20 DIAGNOSIS — M5387 Other specified dorsopathies, lumbosacral region: Secondary | ICD-10-CM | POA: Diagnosis not present

## 2017-01-22 DIAGNOSIS — M9904 Segmental and somatic dysfunction of sacral region: Secondary | ICD-10-CM | POA: Diagnosis not present

## 2017-01-22 DIAGNOSIS — S29019A Strain of muscle and tendon of unspecified wall of thorax, initial encounter: Secondary | ICD-10-CM | POA: Diagnosis not present

## 2017-01-22 DIAGNOSIS — M9902 Segmental and somatic dysfunction of thoracic region: Secondary | ICD-10-CM | POA: Diagnosis not present

## 2017-01-22 DIAGNOSIS — M5387 Other specified dorsopathies, lumbosacral region: Secondary | ICD-10-CM | POA: Diagnosis not present

## 2017-01-22 DIAGNOSIS — M9901 Segmental and somatic dysfunction of cervical region: Secondary | ICD-10-CM | POA: Diagnosis not present

## 2017-01-22 DIAGNOSIS — M5032 Other cervical disc degeneration, mid-cervical region, unspecified level: Secondary | ICD-10-CM | POA: Diagnosis not present

## 2017-01-22 DIAGNOSIS — M9903 Segmental and somatic dysfunction of lumbar region: Secondary | ICD-10-CM | POA: Diagnosis not present

## 2017-01-22 DIAGNOSIS — M542 Cervicalgia: Secondary | ICD-10-CM | POA: Diagnosis not present

## 2017-01-27 DIAGNOSIS — M542 Cervicalgia: Secondary | ICD-10-CM | POA: Diagnosis not present

## 2017-01-27 DIAGNOSIS — S29019A Strain of muscle and tendon of unspecified wall of thorax, initial encounter: Secondary | ICD-10-CM | POA: Diagnosis not present

## 2017-01-27 DIAGNOSIS — M9904 Segmental and somatic dysfunction of sacral region: Secondary | ICD-10-CM | POA: Diagnosis not present

## 2017-01-27 DIAGNOSIS — M9903 Segmental and somatic dysfunction of lumbar region: Secondary | ICD-10-CM | POA: Diagnosis not present

## 2017-01-27 DIAGNOSIS — M5387 Other specified dorsopathies, lumbosacral region: Secondary | ICD-10-CM | POA: Diagnosis not present

## 2017-01-27 DIAGNOSIS — M9901 Segmental and somatic dysfunction of cervical region: Secondary | ICD-10-CM | POA: Diagnosis not present

## 2017-01-27 DIAGNOSIS — M9902 Segmental and somatic dysfunction of thoracic region: Secondary | ICD-10-CM | POA: Diagnosis not present

## 2017-01-27 DIAGNOSIS — M5032 Other cervical disc degeneration, mid-cervical region, unspecified level: Secondary | ICD-10-CM | POA: Diagnosis not present

## 2017-01-27 DIAGNOSIS — J069 Acute upper respiratory infection, unspecified: Secondary | ICD-10-CM | POA: Diagnosis not present

## 2017-02-03 DIAGNOSIS — M542 Cervicalgia: Secondary | ICD-10-CM | POA: Diagnosis not present

## 2017-02-03 DIAGNOSIS — M9904 Segmental and somatic dysfunction of sacral region: Secondary | ICD-10-CM | POA: Diagnosis not present

## 2017-02-03 DIAGNOSIS — S29019A Strain of muscle and tendon of unspecified wall of thorax, initial encounter: Secondary | ICD-10-CM | POA: Diagnosis not present

## 2017-02-03 DIAGNOSIS — M9902 Segmental and somatic dysfunction of thoracic region: Secondary | ICD-10-CM | POA: Diagnosis not present

## 2017-02-03 DIAGNOSIS — M9901 Segmental and somatic dysfunction of cervical region: Secondary | ICD-10-CM | POA: Diagnosis not present

## 2017-02-03 DIAGNOSIS — M5032 Other cervical disc degeneration, mid-cervical region, unspecified level: Secondary | ICD-10-CM | POA: Diagnosis not present

## 2017-02-03 DIAGNOSIS — M5387 Other specified dorsopathies, lumbosacral region: Secondary | ICD-10-CM | POA: Diagnosis not present

## 2017-02-03 DIAGNOSIS — M9903 Segmental and somatic dysfunction of lumbar region: Secondary | ICD-10-CM | POA: Diagnosis not present

## 2017-02-07 DIAGNOSIS — Z23 Encounter for immunization: Secondary | ICD-10-CM | POA: Diagnosis not present

## 2017-02-10 DIAGNOSIS — M9901 Segmental and somatic dysfunction of cervical region: Secondary | ICD-10-CM | POA: Diagnosis not present

## 2017-02-10 DIAGNOSIS — S29019A Strain of muscle and tendon of unspecified wall of thorax, initial encounter: Secondary | ICD-10-CM | POA: Diagnosis not present

## 2017-02-10 DIAGNOSIS — M5032 Other cervical disc degeneration, mid-cervical region, unspecified level: Secondary | ICD-10-CM | POA: Diagnosis not present

## 2017-02-10 DIAGNOSIS — M9903 Segmental and somatic dysfunction of lumbar region: Secondary | ICD-10-CM | POA: Diagnosis not present

## 2017-02-10 DIAGNOSIS — M5387 Other specified dorsopathies, lumbosacral region: Secondary | ICD-10-CM | POA: Diagnosis not present

## 2017-02-10 DIAGNOSIS — M9904 Segmental and somatic dysfunction of sacral region: Secondary | ICD-10-CM | POA: Diagnosis not present

## 2017-02-10 DIAGNOSIS — M9902 Segmental and somatic dysfunction of thoracic region: Secondary | ICD-10-CM | POA: Diagnosis not present

## 2017-02-10 DIAGNOSIS — M542 Cervicalgia: Secondary | ICD-10-CM | POA: Diagnosis not present

## 2017-02-17 DIAGNOSIS — M9902 Segmental and somatic dysfunction of thoracic region: Secondary | ICD-10-CM | POA: Diagnosis not present

## 2017-02-17 DIAGNOSIS — M5032 Other cervical disc degeneration, mid-cervical region, unspecified level: Secondary | ICD-10-CM | POA: Diagnosis not present

## 2017-02-17 DIAGNOSIS — S29019A Strain of muscle and tendon of unspecified wall of thorax, initial encounter: Secondary | ICD-10-CM | POA: Diagnosis not present

## 2017-02-17 DIAGNOSIS — M9904 Segmental and somatic dysfunction of sacral region: Secondary | ICD-10-CM | POA: Diagnosis not present

## 2017-02-17 DIAGNOSIS — M9901 Segmental and somatic dysfunction of cervical region: Secondary | ICD-10-CM | POA: Diagnosis not present

## 2017-02-17 DIAGNOSIS — M9903 Segmental and somatic dysfunction of lumbar region: Secondary | ICD-10-CM | POA: Diagnosis not present

## 2017-02-17 DIAGNOSIS — M542 Cervicalgia: Secondary | ICD-10-CM | POA: Diagnosis not present

## 2017-02-17 DIAGNOSIS — M5387 Other specified dorsopathies, lumbosacral region: Secondary | ICD-10-CM | POA: Diagnosis not present

## 2017-02-24 DIAGNOSIS — Z124 Encounter for screening for malignant neoplasm of cervix: Secondary | ICD-10-CM | POA: Diagnosis not present

## 2017-02-24 DIAGNOSIS — Z8542 Personal history of malignant neoplasm of other parts of uterus: Secondary | ICD-10-CM | POA: Diagnosis not present

## 2017-02-26 DIAGNOSIS — M9903 Segmental and somatic dysfunction of lumbar region: Secondary | ICD-10-CM | POA: Diagnosis not present

## 2017-02-26 DIAGNOSIS — M9902 Segmental and somatic dysfunction of thoracic region: Secondary | ICD-10-CM | POA: Diagnosis not present

## 2017-02-26 DIAGNOSIS — M5032 Other cervical disc degeneration, mid-cervical region, unspecified level: Secondary | ICD-10-CM | POA: Diagnosis not present

## 2017-02-26 DIAGNOSIS — M5387 Other specified dorsopathies, lumbosacral region: Secondary | ICD-10-CM | POA: Diagnosis not present

## 2017-02-26 DIAGNOSIS — M9904 Segmental and somatic dysfunction of sacral region: Secondary | ICD-10-CM | POA: Diagnosis not present

## 2017-02-26 DIAGNOSIS — M542 Cervicalgia: Secondary | ICD-10-CM | POA: Diagnosis not present

## 2017-02-26 DIAGNOSIS — M9901 Segmental and somatic dysfunction of cervical region: Secondary | ICD-10-CM | POA: Diagnosis not present

## 2017-02-26 DIAGNOSIS — S29019A Strain of muscle and tendon of unspecified wall of thorax, initial encounter: Secondary | ICD-10-CM | POA: Diagnosis not present

## 2017-03-06 DIAGNOSIS — M5387 Other specified dorsopathies, lumbosacral region: Secondary | ICD-10-CM | POA: Diagnosis not present

## 2017-03-06 DIAGNOSIS — M9902 Segmental and somatic dysfunction of thoracic region: Secondary | ICD-10-CM | POA: Diagnosis not present

## 2017-03-06 DIAGNOSIS — S29019A Strain of muscle and tendon of unspecified wall of thorax, initial encounter: Secondary | ICD-10-CM | POA: Diagnosis not present

## 2017-03-06 DIAGNOSIS — M5032 Other cervical disc degeneration, mid-cervical region, unspecified level: Secondary | ICD-10-CM | POA: Diagnosis not present

## 2017-03-06 DIAGNOSIS — M9901 Segmental and somatic dysfunction of cervical region: Secondary | ICD-10-CM | POA: Diagnosis not present

## 2017-03-06 DIAGNOSIS — M9904 Segmental and somatic dysfunction of sacral region: Secondary | ICD-10-CM | POA: Diagnosis not present

## 2017-03-06 DIAGNOSIS — M542 Cervicalgia: Secondary | ICD-10-CM | POA: Diagnosis not present

## 2017-03-06 DIAGNOSIS — M9903 Segmental and somatic dysfunction of lumbar region: Secondary | ICD-10-CM | POA: Diagnosis not present

## 2017-03-19 DIAGNOSIS — M5387 Other specified dorsopathies, lumbosacral region: Secondary | ICD-10-CM | POA: Diagnosis not present

## 2017-03-19 DIAGNOSIS — M542 Cervicalgia: Secondary | ICD-10-CM | POA: Diagnosis not present

## 2017-03-19 DIAGNOSIS — M9903 Segmental and somatic dysfunction of lumbar region: Secondary | ICD-10-CM | POA: Diagnosis not present

## 2017-03-19 DIAGNOSIS — M5032 Other cervical disc degeneration, mid-cervical region, unspecified level: Secondary | ICD-10-CM | POA: Diagnosis not present

## 2017-03-19 DIAGNOSIS — S29019A Strain of muscle and tendon of unspecified wall of thorax, initial encounter: Secondary | ICD-10-CM | POA: Diagnosis not present

## 2017-03-19 DIAGNOSIS — M9901 Segmental and somatic dysfunction of cervical region: Secondary | ICD-10-CM | POA: Diagnosis not present

## 2017-03-19 DIAGNOSIS — M9902 Segmental and somatic dysfunction of thoracic region: Secondary | ICD-10-CM | POA: Diagnosis not present

## 2017-03-19 DIAGNOSIS — M9904 Segmental and somatic dysfunction of sacral region: Secondary | ICD-10-CM | POA: Diagnosis not present

## 2017-04-02 DIAGNOSIS — M5387 Other specified dorsopathies, lumbosacral region: Secondary | ICD-10-CM | POA: Diagnosis not present

## 2017-04-02 DIAGNOSIS — S29019A Strain of muscle and tendon of unspecified wall of thorax, initial encounter: Secondary | ICD-10-CM | POA: Diagnosis not present

## 2017-04-02 DIAGNOSIS — M9904 Segmental and somatic dysfunction of sacral region: Secondary | ICD-10-CM | POA: Diagnosis not present

## 2017-04-02 DIAGNOSIS — M9903 Segmental and somatic dysfunction of lumbar region: Secondary | ICD-10-CM | POA: Diagnosis not present

## 2017-04-02 DIAGNOSIS — M9901 Segmental and somatic dysfunction of cervical region: Secondary | ICD-10-CM | POA: Diagnosis not present

## 2017-04-02 DIAGNOSIS — M5032 Other cervical disc degeneration, mid-cervical region, unspecified level: Secondary | ICD-10-CM | POA: Diagnosis not present

## 2017-04-02 DIAGNOSIS — M9902 Segmental and somatic dysfunction of thoracic region: Secondary | ICD-10-CM | POA: Diagnosis not present

## 2017-04-02 DIAGNOSIS — M542 Cervicalgia: Secondary | ICD-10-CM | POA: Diagnosis not present

## 2017-04-03 DIAGNOSIS — M810 Age-related osteoporosis without current pathological fracture: Secondary | ICD-10-CM | POA: Diagnosis not present

## 2017-04-03 DIAGNOSIS — Z0000 Encounter for general adult medical examination without abnormal findings: Secondary | ICD-10-CM | POA: Diagnosis not present

## 2017-04-03 DIAGNOSIS — Z9181 History of falling: Secondary | ICD-10-CM | POA: Diagnosis not present

## 2017-04-03 DIAGNOSIS — E669 Obesity, unspecified: Secondary | ICD-10-CM | POA: Diagnosis not present

## 2017-04-03 DIAGNOSIS — M545 Low back pain: Secondary | ICD-10-CM | POA: Diagnosis not present

## 2017-04-03 DIAGNOSIS — Z6834 Body mass index (BMI) 34.0-34.9, adult: Secondary | ICD-10-CM | POA: Diagnosis not present

## 2017-04-03 DIAGNOSIS — Z6836 Body mass index (BMI) 36.0-36.9, adult: Secondary | ICD-10-CM | POA: Diagnosis not present

## 2017-04-03 DIAGNOSIS — F325 Major depressive disorder, single episode, in full remission: Secondary | ICD-10-CM | POA: Diagnosis not present

## 2017-04-11 DIAGNOSIS — M5136 Other intervertebral disc degeneration, lumbar region: Secondary | ICD-10-CM | POA: Diagnosis not present

## 2017-04-17 DIAGNOSIS — D1801 Hemangioma of skin and subcutaneous tissue: Secondary | ICD-10-CM | POA: Diagnosis not present

## 2017-04-17 DIAGNOSIS — D2239 Melanocytic nevi of other parts of face: Secondary | ICD-10-CM | POA: Diagnosis not present

## 2017-04-17 DIAGNOSIS — D225 Melanocytic nevi of trunk: Secondary | ICD-10-CM | POA: Diagnosis not present

## 2017-04-17 DIAGNOSIS — L821 Other seborrheic keratosis: Secondary | ICD-10-CM | POA: Diagnosis not present

## 2017-05-08 DIAGNOSIS — R8271 Bacteriuria: Secondary | ICD-10-CM | POA: Diagnosis not present

## 2017-05-08 DIAGNOSIS — N200 Calculus of kidney: Secondary | ICD-10-CM | POA: Diagnosis not present

## 2017-05-12 DIAGNOSIS — H04123 Dry eye syndrome of bilateral lacrimal glands: Secondary | ICD-10-CM | POA: Diagnosis not present

## 2017-05-15 DIAGNOSIS — R51 Headache: Secondary | ICD-10-CM | POA: Diagnosis not present

## 2017-05-15 DIAGNOSIS — R739 Hyperglycemia, unspecified: Secondary | ICD-10-CM | POA: Diagnosis not present

## 2017-05-15 DIAGNOSIS — J209 Acute bronchitis, unspecified: Secondary | ICD-10-CM | POA: Diagnosis not present

## 2017-05-15 DIAGNOSIS — Z7982 Long term (current) use of aspirin: Secondary | ICD-10-CM | POA: Diagnosis not present

## 2017-05-15 DIAGNOSIS — R109 Unspecified abdominal pain: Secondary | ICD-10-CM | POA: Diagnosis not present

## 2017-05-15 DIAGNOSIS — E669 Obesity, unspecified: Secondary | ICD-10-CM | POA: Diagnosis not present

## 2017-05-15 DIAGNOSIS — Z87442 Personal history of urinary calculi: Secondary | ICD-10-CM | POA: Diagnosis not present

## 2017-05-15 DIAGNOSIS — R0902 Hypoxemia: Secondary | ICD-10-CM | POA: Diagnosis not present

## 2017-05-15 DIAGNOSIS — J9811 Atelectasis: Secondary | ICD-10-CM | POA: Diagnosis not present

## 2017-05-15 DIAGNOSIS — F329 Major depressive disorder, single episode, unspecified: Secondary | ICD-10-CM | POA: Diagnosis not present

## 2017-05-15 DIAGNOSIS — R42 Dizziness and giddiness: Secondary | ICD-10-CM | POA: Diagnosis not present

## 2017-05-15 DIAGNOSIS — J9601 Acute respiratory failure with hypoxia: Secondary | ICD-10-CM | POA: Diagnosis not present

## 2017-05-15 DIAGNOSIS — E785 Hyperlipidemia, unspecified: Secondary | ICD-10-CM | POA: Diagnosis not present

## 2017-05-15 DIAGNOSIS — N390 Urinary tract infection, site not specified: Secondary | ICD-10-CM | POA: Diagnosis not present

## 2017-05-15 DIAGNOSIS — E860 Dehydration: Secondary | ICD-10-CM | POA: Diagnosis not present

## 2017-05-15 DIAGNOSIS — N12 Tubulo-interstitial nephritis, not specified as acute or chronic: Secondary | ICD-10-CM | POA: Diagnosis not present

## 2017-05-15 DIAGNOSIS — I10 Essential (primary) hypertension: Secondary | ICD-10-CM | POA: Diagnosis not present

## 2017-05-15 DIAGNOSIS — R531 Weakness: Secondary | ICD-10-CM | POA: Diagnosis not present

## 2017-05-15 DIAGNOSIS — Z79899 Other long term (current) drug therapy: Secondary | ICD-10-CM | POA: Diagnosis not present

## 2017-05-15 DIAGNOSIS — N179 Acute kidney failure, unspecified: Secondary | ICD-10-CM | POA: Diagnosis not present

## 2017-05-15 DIAGNOSIS — N200 Calculus of kidney: Secondary | ICD-10-CM | POA: Diagnosis not present

## 2017-05-15 DIAGNOSIS — E871 Hypo-osmolality and hyponatremia: Secondary | ICD-10-CM | POA: Diagnosis not present

## 2017-05-16 DIAGNOSIS — R109 Unspecified abdominal pain: Secondary | ICD-10-CM | POA: Diagnosis not present

## 2017-05-16 DIAGNOSIS — J9601 Acute respiratory failure with hypoxia: Secondary | ICD-10-CM | POA: Diagnosis present

## 2017-05-16 DIAGNOSIS — E669 Obesity, unspecified: Secondary | ICD-10-CM | POA: Diagnosis not present

## 2017-05-16 DIAGNOSIS — F329 Major depressive disorder, single episode, unspecified: Secondary | ICD-10-CM | POA: Diagnosis present

## 2017-05-16 DIAGNOSIS — N390 Urinary tract infection, site not specified: Secondary | ICD-10-CM | POA: Diagnosis not present

## 2017-05-16 DIAGNOSIS — E871 Hypo-osmolality and hyponatremia: Secondary | ICD-10-CM | POA: Diagnosis present

## 2017-05-16 DIAGNOSIS — I10 Essential (primary) hypertension: Secondary | ICD-10-CM | POA: Diagnosis not present

## 2017-05-16 DIAGNOSIS — R531 Weakness: Secondary | ICD-10-CM | POA: Diagnosis not present

## 2017-05-16 DIAGNOSIS — N179 Acute kidney failure, unspecified: Secondary | ICD-10-CM | POA: Diagnosis present

## 2017-05-16 DIAGNOSIS — E785 Hyperlipidemia, unspecified: Secondary | ICD-10-CM | POA: Diagnosis not present

## 2017-05-16 DIAGNOSIS — Z79899 Other long term (current) drug therapy: Secondary | ICD-10-CM | POA: Diagnosis not present

## 2017-05-16 DIAGNOSIS — J209 Acute bronchitis, unspecified: Secondary | ICD-10-CM | POA: Diagnosis not present

## 2017-05-16 DIAGNOSIS — R0902 Hypoxemia: Secondary | ICD-10-CM | POA: Diagnosis not present

## 2017-05-16 DIAGNOSIS — N200 Calculus of kidney: Secondary | ICD-10-CM | POA: Diagnosis not present

## 2017-05-16 DIAGNOSIS — Z7982 Long term (current) use of aspirin: Secondary | ICD-10-CM | POA: Diagnosis not present

## 2017-05-16 DIAGNOSIS — E860 Dehydration: Secondary | ICD-10-CM | POA: Diagnosis not present

## 2017-05-16 DIAGNOSIS — R739 Hyperglycemia, unspecified: Secondary | ICD-10-CM | POA: Diagnosis not present

## 2017-05-16 DIAGNOSIS — Z87442 Personal history of urinary calculi: Secondary | ICD-10-CM | POA: Diagnosis not present

## 2017-05-26 DIAGNOSIS — J069 Acute upper respiratory infection, unspecified: Secondary | ICD-10-CM | POA: Diagnosis not present

## 2017-05-26 DIAGNOSIS — I10 Essential (primary) hypertension: Secondary | ICD-10-CM | POA: Diagnosis not present

## 2017-05-26 DIAGNOSIS — R531 Weakness: Secondary | ICD-10-CM | POA: Diagnosis not present

## 2017-05-26 DIAGNOSIS — R5381 Other malaise: Secondary | ICD-10-CM | POA: Diagnosis not present

## 2017-05-26 DIAGNOSIS — Z79899 Other long term (current) drug therapy: Secondary | ICD-10-CM | POA: Diagnosis not present

## 2017-05-28 DIAGNOSIS — Z79891 Long term (current) use of opiate analgesic: Secondary | ICD-10-CM

## 2017-05-28 HISTORY — DX: Long term (current) use of opiate analgesic: Z79.891

## 2017-07-04 DIAGNOSIS — M791 Myalgia, unspecified site: Secondary | ICD-10-CM | POA: Diagnosis not present

## 2017-07-04 DIAGNOSIS — R5383 Other fatigue: Secondary | ICD-10-CM | POA: Diagnosis not present

## 2017-07-04 DIAGNOSIS — Z79899 Other long term (current) drug therapy: Secondary | ICD-10-CM | POA: Diagnosis not present

## 2017-07-04 DIAGNOSIS — R350 Frequency of micturition: Secondary | ICD-10-CM | POA: Diagnosis not present

## 2017-07-04 DIAGNOSIS — N3281 Overactive bladder: Secondary | ICD-10-CM | POA: Diagnosis not present

## 2017-07-04 DIAGNOSIS — N3000 Acute cystitis without hematuria: Secondary | ICD-10-CM | POA: Diagnosis not present

## 2017-07-04 DIAGNOSIS — I10 Essential (primary) hypertension: Secondary | ICD-10-CM | POA: Diagnosis not present

## 2017-07-04 DIAGNOSIS — R2689 Other abnormalities of gait and mobility: Secondary | ICD-10-CM | POA: Diagnosis not present

## 2017-07-10 ENCOUNTER — Encounter (HOSPITAL_COMMUNITY): Payer: Self-pay | Admitting: Emergency Medicine

## 2017-07-10 ENCOUNTER — Other Ambulatory Visit: Payer: Self-pay

## 2017-07-10 ENCOUNTER — Emergency Department (HOSPITAL_COMMUNITY): Payer: Medicare Other

## 2017-07-10 ENCOUNTER — Inpatient Hospital Stay (HOSPITAL_COMMUNITY)
Admission: EM | Admit: 2017-07-10 | Discharge: 2017-07-12 | DRG: 683 | Disposition: A | Discharge status: Home / Self Care | Payer: Medicare Other | Attending: Internal Medicine | Admitting: Internal Medicine

## 2017-07-10 ENCOUNTER — Inpatient Hospital Stay (HOSPITAL_COMMUNITY): Payer: Medicare Other

## 2017-07-10 DIAGNOSIS — I959 Hypotension, unspecified: Secondary | ICD-10-CM

## 2017-07-10 DIAGNOSIS — I361 Nonrheumatic tricuspid (valve) insufficiency: Secondary | ICD-10-CM | POA: Diagnosis not present

## 2017-07-10 DIAGNOSIS — H5340 Unspecified visual field defects: Secondary | ICD-10-CM | POA: Diagnosis present

## 2017-07-10 DIAGNOSIS — G8929 Other chronic pain: Secondary | ICD-10-CM | POA: Diagnosis present

## 2017-07-10 DIAGNOSIS — R42 Dizziness and giddiness: Secondary | ICD-10-CM | POA: Diagnosis not present

## 2017-07-10 DIAGNOSIS — N179 Acute kidney failure, unspecified: Principal | ICD-10-CM | POA: Diagnosis present

## 2017-07-10 DIAGNOSIS — Z8542 Personal history of malignant neoplasm of other parts of uterus: Secondary | ICD-10-CM | POA: Diagnosis not present

## 2017-07-10 DIAGNOSIS — Z7983 Long term (current) use of bisphosphonates: Secondary | ICD-10-CM | POA: Diagnosis not present

## 2017-07-10 DIAGNOSIS — N390 Urinary tract infection, site not specified: Secondary | ICD-10-CM | POA: Diagnosis not present

## 2017-07-10 DIAGNOSIS — R55 Syncope and collapse: Secondary | ICD-10-CM | POA: Diagnosis not present

## 2017-07-10 DIAGNOSIS — E785 Hyperlipidemia, unspecified: Secondary | ICD-10-CM | POA: Diagnosis present

## 2017-07-10 DIAGNOSIS — M5136 Other intervertebral disc degeneration, lumbar region: Secondary | ICD-10-CM | POA: Diagnosis present

## 2017-07-10 DIAGNOSIS — M797 Fibromyalgia: Secondary | ICD-10-CM | POA: Diagnosis not present

## 2017-07-10 DIAGNOSIS — D352 Benign neoplasm of pituitary gland: Secondary | ICD-10-CM | POA: Diagnosis present

## 2017-07-10 DIAGNOSIS — E860 Dehydration: Secondary | ICD-10-CM | POA: Diagnosis present

## 2017-07-10 DIAGNOSIS — R109 Unspecified abdominal pain: Secondary | ICD-10-CM | POA: Diagnosis not present

## 2017-07-10 DIAGNOSIS — Z87442 Personal history of urinary calculi: Secondary | ICD-10-CM | POA: Diagnosis not present

## 2017-07-10 DIAGNOSIS — Z823 Family history of stroke: Secondary | ICD-10-CM

## 2017-07-10 DIAGNOSIS — B952 Enterococcus as the cause of diseases classified elsewhere: Secondary | ICD-10-CM | POA: Diagnosis present

## 2017-07-10 DIAGNOSIS — F329 Major depressive disorder, single episode, unspecified: Secondary | ICD-10-CM | POA: Diagnosis present

## 2017-07-10 DIAGNOSIS — E872 Acidosis, unspecified: Secondary | ICD-10-CM

## 2017-07-10 DIAGNOSIS — F419 Anxiety disorder, unspecified: Secondary | ICD-10-CM | POA: Diagnosis present

## 2017-07-10 DIAGNOSIS — H539 Unspecified visual disturbance: Secondary | ICD-10-CM | POA: Diagnosis not present

## 2017-07-10 DIAGNOSIS — M810 Age-related osteoporosis without current pathological fracture: Secondary | ICD-10-CM | POA: Diagnosis present

## 2017-07-10 DIAGNOSIS — Z7982 Long term (current) use of aspirin: Secondary | ICD-10-CM | POA: Diagnosis not present

## 2017-07-10 DIAGNOSIS — I10 Essential (primary) hypertension: Secondary | ICD-10-CM | POA: Diagnosis present

## 2017-07-10 DIAGNOSIS — R220 Localized swelling, mass and lump, head: Secondary | ICD-10-CM | POA: Diagnosis not present

## 2017-07-10 DIAGNOSIS — N200 Calculus of kidney: Secondary | ICD-10-CM | POA: Diagnosis not present

## 2017-07-10 DIAGNOSIS — H538 Other visual disturbances: Secondary | ICD-10-CM | POA: Diagnosis not present

## 2017-07-10 DIAGNOSIS — D497 Neoplasm of unspecified behavior of endocrine glands and other parts of nervous system: Secondary | ICD-10-CM | POA: Diagnosis not present

## 2017-07-10 DIAGNOSIS — R531 Weakness: Secondary | ICD-10-CM | POA: Diagnosis not present

## 2017-07-10 DIAGNOSIS — G894 Chronic pain syndrome: Secondary | ICD-10-CM | POA: Diagnosis present

## 2017-07-10 HISTORY — DX: Urinary tract infection, site not specified: N39.0

## 2017-07-10 HISTORY — DX: Syncope and collapse: R55

## 2017-07-10 HISTORY — DX: Acidosis, unspecified: E87.20

## 2017-07-10 HISTORY — DX: Acidosis: E87.2

## 2017-07-10 LAB — URINALYSIS, ROUTINE W REFLEX MICROSCOPIC
Bilirubin Urine: NEGATIVE
Glucose, UA: NEGATIVE mg/dL
Ketones, ur: 5 mg/dL — AB
Nitrite: NEGATIVE
Protein, ur: 30 mg/dL — AB
Specific Gravity, Urine: 1.017 (ref 1.005–1.030)
pH: 5 (ref 5.0–8.0)

## 2017-07-10 LAB — BASIC METABOLIC PANEL
Anion gap: 12 (ref 5–15)
BUN: 23 mg/dL — ABNORMAL HIGH (ref 6–20)
CO2: 27 mmol/L (ref 22–32)
Calcium: 9.4 mg/dL (ref 8.9–10.3)
Chloride: 99 mmol/L — ABNORMAL LOW (ref 101–111)
Creatinine, Ser: 1.25 mg/dL — ABNORMAL HIGH (ref 0.44–1.00)
GFR calc Af Amer: 50 mL/min — ABNORMAL LOW (ref >60)
GFR calc non Af Amer: 43 mL/min — ABNORMAL LOW (ref >60)
Glucose, Bld: 121 mg/dL — ABNORMAL HIGH (ref 65–99)
Potassium: 4.2 mmol/L (ref 3.5–5.1)
Sodium: 138 mmol/L (ref 135–145)

## 2017-07-10 LAB — DIFFERENTIAL
Basophils Absolute: 0 10*3/uL (ref 0.0–0.1)
Basophils Relative: 0 %
Eosinophils Absolute: 0.1 10*3/uL (ref 0.0–0.7)
Eosinophils Relative: 0 %
Lymphocytes Relative: 3 %
Lymphs Abs: 0.4 10*3/uL — ABNORMAL LOW (ref 0.7–4.0)
Monocytes Absolute: 0.5 10*3/uL (ref 0.1–1.0)
Monocytes Relative: 3 %
Neutro Abs: 16.3 10*3/uL — ABNORMAL HIGH (ref 1.7–7.7)
Neutrophils Relative %: 94 %

## 2017-07-10 LAB — CBC
HCT: 39.6 % (ref 36.0–46.0)
Hemoglobin: 12.9 g/dL (ref 12.0–15.0)
MCH: 31.6 pg (ref 26.0–34.0)
MCHC: 32.6 g/dL (ref 30.0–36.0)
MCV: 97.1 fL (ref 78.0–100.0)
Platelets: 228 10*3/uL (ref 150–400)
RBC: 4.08 MIL/uL (ref 3.87–5.11)
RDW: 13.8 % (ref 11.5–15.5)
WBC: 17.3 10*3/uL — ABNORMAL HIGH (ref 4.0–10.5)

## 2017-07-10 LAB — I-STAT CG4 LACTIC ACID, ED
Lactic Acid, Venous: 2.12 mmol/L (ref 0.5–1.9)
Lactic Acid, Venous: 0.65 mmol/L (ref 0.5–1.9)

## 2017-07-10 LAB — HEPATIC FUNCTION PANEL
ALT: 18 U/L (ref 14–54)
AST: 27 U/L (ref 15–41)
Albumin: 3.4 g/dL — ABNORMAL LOW (ref 3.5–5.0)
Alkaline Phosphatase: 67 U/L (ref 38–126)
Bilirubin, Direct: 0.2 mg/dL (ref 0.1–0.5)
Indirect Bilirubin: 0.6 mg/dL (ref 0.3–0.9)
Total Bilirubin: 0.8 mg/dL (ref 0.3–1.2)
Total Protein: 6.1 g/dL — ABNORMAL LOW (ref 6.5–8.1)

## 2017-07-10 LAB — TSH: TSH: 0.597 u[IU]/mL (ref 0.350–4.500)

## 2017-07-10 LAB — CBG MONITORING, ED: Glucose-Capillary: 111 mg/dL — ABNORMAL HIGH (ref 65–99)

## 2017-07-10 LAB — TROPONIN I: Troponin I: <0.03 ng/mL (ref <0.03)

## 2017-07-10 MED ORDER — DULOXETINE HCL 60 MG PO CPEP
60.0000 mg | ORAL_CAPSULE | Freq: Every day | ORAL | Status: DC
  Administered 2017-07-11 – 2017-07-12 (×2): 60 mg via ORAL
  Filled 2017-07-10 (×2): qty 1

## 2017-07-10 MED ORDER — ONDANSETRON HCL 4 MG PO TABS: 4.0000 mg | ORAL_TABLET | Freq: Four times a day (QID) | ORAL | Status: DC | PRN

## 2017-07-10 MED ORDER — IOPAMIDOL (ISOVUE-300) INJECTION 61%
75.0000 mL | Freq: Once | INTRAVENOUS | Status: AC | PRN
  Administered 2017-07-10: 75 mL via INTRAVENOUS

## 2017-07-10 MED ORDER — MELATONIN 5 MG PO TABS
10.0000 mg | ORAL_TABLET | Freq: Every day | ORAL | Status: DC
  Administered 2017-07-10 – 2017-07-11 (×2): 10 mg via ORAL
  Filled 2017-07-10 (×3): qty 2

## 2017-07-10 MED ORDER — ONDANSETRON HCL 4 MG/2ML IJ SOLN: 4.0000 mg | Freq: Four times a day (QID) | INTRAMUSCULAR | Status: DC | PRN

## 2017-07-10 MED ORDER — SODIUM CHLORIDE 0.9 % IV BOLUS (SEPSIS)
1000.0000 mL | Freq: Once | INTRAVENOUS | Status: AC
  Administered 2017-07-10: 1000 mL via INTRAVENOUS

## 2017-07-10 MED ORDER — ACETAMINOPHEN 325 MG PO TABS: 650.0000 mg | ORAL_TABLET | Freq: Four times a day (QID) | ORAL | Status: DC | PRN

## 2017-07-10 MED ORDER — METOPROLOL SUCCINATE ER 25 MG PO TB24
25.0000 mg | ORAL_TABLET | Freq: Every day | ORAL | Status: DC
  Administered 2017-07-11 – 2017-07-12 (×2): 25 mg via ORAL
  Filled 2017-07-10 (×2): qty 1

## 2017-07-10 MED ORDER — FLUTICASONE PROPIONATE 50 MCG/ACT NA SUSP
1.0000 | Freq: Every day | NASAL | Status: DC
  Administered 2017-07-11 – 2017-07-12 (×2): 1 via NASAL
  Filled 2017-07-10: qty 16

## 2017-07-10 MED ORDER — ENOXAPARIN SODIUM 40 MG/0.4ML ~~LOC~~ SOLN
40.0000 mg | Freq: Every day | SUBCUTANEOUS | Status: DC
  Administered 2017-07-10 – 2017-07-11 (×2): 40 mg via SUBCUTANEOUS
  Filled 2017-07-10 (×2): qty 0.4

## 2017-07-10 MED ORDER — GABAPENTIN 300 MG PO CAPS
600.0000 mg | ORAL_CAPSULE | Freq: Three times a day (TID) | ORAL | Status: DC
  Administered 2017-07-10 – 2017-07-12 (×5): 600 mg via ORAL
  Filled 2017-07-10 (×5): qty 2

## 2017-07-10 MED ORDER — OXYCODONE-ACETAMINOPHEN 10-325 MG PO TABS: 1.0000 | ORAL_TABLET | Freq: Three times a day (TID) | ORAL | Status: DC | PRN

## 2017-07-10 MED ORDER — PIPERACILLIN-TAZOBACTAM 3.375 G IVPB 30 MIN
3.3750 g | Freq: Once | INTRAVENOUS | Status: AC
  Administered 2017-07-10: 3.375 g via INTRAVENOUS
  Filled 2017-07-10: qty 50

## 2017-07-10 MED ORDER — ASPIRIN EC 81 MG PO TBEC
81.0000 mg | DELAYED_RELEASE_TABLET | Freq: Every day | ORAL | Status: DC
  Administered 2017-07-11 – 2017-07-12 (×2): 81 mg via ORAL
  Filled 2017-07-10 (×2): qty 1

## 2017-07-10 MED ORDER — DOCUSATE SODIUM 100 MG PO CAPS
100.0000 mg | ORAL_CAPSULE | Freq: Two times a day (BID) | ORAL | Status: DC
  Administered 2017-07-10 – 2017-07-12 (×4): 100 mg via ORAL
  Filled 2017-07-10 (×5): qty 1

## 2017-07-10 MED ORDER — SODIUM CHLORIDE 0.9 % IV SOLN
2.0000 g | INTRAVENOUS | Status: DC
  Administered 2017-07-10: 2 g via INTRAVENOUS
  Filled 2017-07-10: qty 2

## 2017-07-10 MED ORDER — ALPRAZOLAM 0.25 MG PO TABS
0.2500 mg | ORAL_TABLET | Freq: Three times a day (TID) | ORAL | Status: DC | PRN
  Administered 2017-07-11: 0.25 mg via ORAL
  Filled 2017-07-10: qty 1

## 2017-07-10 MED ORDER — TRAZODONE HCL 100 MG PO TABS
100.0000 mg | ORAL_TABLET | Freq: Every day | ORAL | Status: DC
  Administered 2017-07-10 – 2017-07-11 (×2): 100 mg via ORAL
  Filled 2017-07-10 (×2): qty 1

## 2017-07-10 MED ORDER — ADULT MULTIVITAMIN W/MINERALS CH
1.0000 | ORAL_TABLET | Freq: Every day | ORAL | Status: DC
  Administered 2017-07-11 – 2017-07-12 (×2): 1 via ORAL
  Filled 2017-07-10 (×2): qty 1

## 2017-07-10 MED ORDER — OXYCODONE-ACETAMINOPHEN 5-325 MG PO TABS
1.0000 | ORAL_TABLET | Freq: Three times a day (TID) | ORAL | Status: DC | PRN
  Administered 2017-07-10 – 2017-07-12 (×2): 1 via ORAL
  Filled 2017-07-10 (×2): qty 1

## 2017-07-10 MED ORDER — ACETAMINOPHEN 650 MG RE SUPP: 650.0000 mg | Freq: Four times a day (QID) | RECTAL | Status: DC | PRN

## 2017-07-10 MED ORDER — OXYCODONE HCL 5 MG PO TABS
5.0000 mg | ORAL_TABLET | Freq: Three times a day (TID) | ORAL | Status: DC | PRN
  Administered 2017-07-10 – 2017-07-12 (×2): 5 mg via ORAL
  Filled 2017-07-10 (×2): qty 1

## 2017-07-10 MED ORDER — PRAVASTATIN SODIUM 40 MG PO TABS
40.0000 mg | ORAL_TABLET | Freq: Every day | ORAL | Status: DC
Start: 2017-07-10 — End: 2017-07-12
  Administered 2017-07-10 – 2017-07-11 (×2): 40 mg via ORAL
  Filled 2017-07-10 (×2): qty 1

## 2017-07-10 MED ORDER — OMEGA-3-ACID ETHYL ESTERS 1 G PO CAPS
1.0000 g | ORAL_CAPSULE | Freq: Two times a day (BID) | ORAL | Status: DC
  Administered 2017-07-10 – 2017-07-12 (×4): 1 g via ORAL
  Filled 2017-07-10 (×4): qty 1

## 2017-07-10 MED ORDER — SODIUM CHLORIDE 0.9 % IV SOLN
INTRAVENOUS | Status: AC
  Administered 2017-07-10 – 2017-07-11 (×2): via INTRAVENOUS

## 2017-07-10 MED ORDER — TRAMADOL HCL 50 MG PO TABS: 50.0000 mg | ORAL_TABLET | Freq: Four times a day (QID) | ORAL | Status: DC | PRN

## 2017-07-10 MED ORDER — SENNOSIDES-DOCUSATE SODIUM 8.6-50 MG PO TABS: 1.0000 | ORAL_TABLET | Freq: Every evening | ORAL | Status: DC | PRN

## 2017-07-10 NOTE — H&P (Signed)
History and Physical    Kimberly Larsen QPY:[id number] DOB: [birth year] DOA: 07/10/2017  PCP: Ernestene Kiel, MD   Patient coming from: Home  Chief Complaint: Lightheadedness, Dizziness with Blurred vision, Urinary Urgency   HPI: Kimberly Larsen is a 72 y.o. female with medical history significant of Chronic Back Pain from Hx of Back Surgery, Hx of Endometrial Cancer, Anxiety, Hx of Nephrolithiasis, HLD, HTN, Hx of Gastroparesis and other comorbids who presented with Lightheadedness, Dizziness, Generalized Weakness and Urinary Urgency. Patient states she woke up this AM and it was hard for her to get out of bed but when she did she felt wobbly. She went back to bed and woke up again with dizziness and lightheadedness along with blurred vision. She recently saw her PCP for Urinary Urgency and was diagnosed with UTI and started Macrobid but only took one dose. She has not been eating or drinking as well the last few days and states she has had some nausea but no vomiting. She presented to the ED for evaluation and was found to be hypotensive. She was given 2 liters of IVF with improvement and [facility] was called to admit for UTI.   ED Course: Had blood work done as well as CT Abd/Pelvis and given 2 Liters of IVF and IV Zosyn.   Review of Systems: As per HPI otherwise 10 point review of systems negative.   Past Medical History:  Diagnosis Date  . Anxiety   . Arthritis   . Back pain   . Cancer [facility])    endometrial cancer  . DDD (degenerative disc disease), lumbar   . Depression   . Gastroparesis   . History of kidney stones   . HLD (hyperlipidemia)   . Hx of sepsis 02/2014   DUE TO MULTIPLE KIDNEY STONES  . Hypertension   . Osteoporosis   . Pneumonia    2014  . UTI (urinary tract infection)     Past Surgical History:  Procedure Laterality Date  . BACK SURGERY  2013  . CARPAL TUNNEL RELEASE     R hand  . CYSTOSCOPY WITH URETEROSCOPY AND STENT PLACEMENT Bilateral 03/25/2014   Procedure:  CYSTOSCOPY WITH URETEROSCOPY AND STENT PLACEMENT;  Surgeon: Raynelle Bring, MD;  Location: [facility];  Service: Urology;  Laterality: Bilateral;  . CYSTOSCOPY WITH URETEROSCOPY AND STENT PLACEMENT Bilateral 04/18/2014   Procedure: CYSTOSCOPY WITH URETEROSCOPY AND STENT PLACEMENT,RETROGRADE;  Surgeon: Raynelle Bring, MD;  Location: [facility];  Service: Urology;  Laterality: Bilateral;  . CYSTOSCOPY WITH URETEROSCOPY AND STENT PLACEMENT Right 05/30/2014   Procedure: CYSTOSCOPY WITH URETEROSCOPY AND STENT PLACEMENT;  Surgeon: Raynelle Bring, MD;  Location: [facility];  Service: Urology;  Laterality: Right;  . CYSTOSCOPY/RETROGRADE/URETEROSCOPY Left 05/30/2014   Procedure: LEFT RETROGRADE;  Surgeon: Raynelle Bring, MD;  Location: [facility];  Service: Urology;  Laterality: Left;  . EYE SURGERY     cataract surgery bil  . GANGLION CYST EXCISION     L ankle  . HAMMERTOE RECONSTRUCTION WITH WEIL OSTEOTOMY Left 09/05/2016   Procedure: Second Metatarsal Weil Osteotomy and Hammertoe Correction;  Surgeon: Wylene Simmer, MD;  Location: [place];  Service: Orthopedics;  Laterality: Left;  . HOLMIUM LASER APPLICATION Bilateral 12/45/8099   Procedure: HOLMIUM LASER APPLICATION;  Surgeon: Raynelle Bring, MD;  Location: [facility];  Service: Urology;  Laterality: Bilateral;  . JOINT REPLACEMENT     total knee  . LITHOTRIPSY    . LUMBAR FUSION  09/2011  . METATARSAL OSTEOTOMY WITH BUNIONECTOMY Left  09/05/2016   Procedure: Left First Metatarsal Scarf Osteotomy, Modified McBride Bunion Correction;  Surgeon: Wylene Simmer, MD;  Location: [place];  Service: Orthopedics;  Laterality: Left;  . RHINOPLASTY    . ROBOTIC ASSISTED TOTAL HYSTERECTOMY WITH BILATERAL SALPINGO OOPHERECTOMY Bilateral 01/16/2016   Procedure: XI ROBOTIC ASSISTED TOTAL HYSTERECTOMY WITH BILATERAL SALPINGO OOPHORECTOMY AND BILATERAL PELVIC LYMPH NODE DISSECTION;  Surgeon: Everitt Amber, MD;  Location: [facility];  Service: Gynecology;  Laterality:  Bilateral;  . SPINAL CORD STIMULATOR INSERTION N/A 02/23/2014   Procedure: SPINAL CORD STIMULATOR PLACEMENT ;  Surgeon: Melina Schools, MD;  Location: [place];  Service: Orthopedics;  Laterality: N/A;  . TONSILLECTOMY    . TOTAL KNEE ARTHROPLASTY Right 08/31/2012   Procedure: RIGHT TOTAL KNEE ARTHROPLASTY;  Surgeon: Mauri Pole, MD;  Location: [facility];  Service: Orthopedics;  Laterality: Right;  with LMA  . TUBAL LIGATION     SOCIAL HISTORY  reports that  has never smoked. she has never used smokeless tobacco. She reports that she does not drink alcohol or use drugs.  Allergies  Allergen Reactions  . Augmentin [Amoxicillin-Pot Clavulanate] Nausea Only  . Wellbutrin [Bupropion] Nausea Only   Family History  Problem Relation Age of Onset  . Stroke Mother   . Stroke Father    Prior to Admission medications   Medication Sig Start Date End Date Taking? Authorizing Provider  alendronate (FOSAMAX) 70 MG tablet Take 70 mg by mouth once a week. 06/16/17  Yes [provider]  ALPRAZolam (XANAX) 0.25 MG tablet Take 0.25 mg by mouth 3 (three) times daily as needed. 05/30/17  Yes [provider]  aspirin EC 81 MG tablet Take 81 mg by mouth daily.   Yes [provider]  B Complex Vitamins (B COMPLEX PO) Take 1 tablet by mouth daily.   Yes [provider]  docusate sodium (COLACE) 100 MG capsule Take 1 capsule (100 mg total) by mouth 2 (two) times daily. While taking narcotic pain medicine. 09/05/16  Yes Corky Sing, PA-C  DULoxetine (CYMBALTA) 60 MG capsule Take 60 mg by mouth daily.   Yes [provider]  fluticasone (FLONASE) 50 MCG/ACT nasal spray Place 1 spray into both nostrils daily.   Yes [provider]  gabapentin (NEURONTIN) 600 MG tablet Take 600 mg by mouth 3 (three) times daily.   Yes [provider]  Melatonin 10 MG CAPS Take 10 mg by mouth at bedtime.   Yes [provider]  metoprolol succinate (TOPROL-XL) 25 MG  24 hr tablet Take 25 mg by mouth daily.  06/15/14  Yes [provider]  Multiple Vitamin (MULTIVITAMIN WITH MINERALS) TABS Take 1 tablet by mouth daily. 09/11/12  Yes Judeth Cornfield, NP  Multiple Vitamins-Minerals (HAIR SKIN AND NAILS FORMULA PO) Take 1 tablet by mouth daily.   Yes [provider]  nitrofurantoin, macrocrystal-monohydrate, (MACROBID) 100 MG capsule Take 100 mg by mouth 2 (two) times daily. 07/09/17  Yes [provider]  Omega-3 Fatty Acids (FISH OIL) 600 MG CAPS Take 1 capsule by mouth 2 (two) times daily. 09/11/12  Yes Judeth Cornfield, NP  oxyCODONE-acetaminophen (PERCOCET) 10-325 MG tablet Take 1 tablet by mouth 3 (three) times daily as needed for pain. 06/30/17  Yes [provider]  pravastatin (PRAVACHOL) 40 MG tablet Take 1 tablet (40 mg total) by mouth at bedtime. 09/11/12  Yes Judeth Cornfield, NP  traZODone (DESYREL) 100 MG tablet Take 100 mg by mouth at bedtime.   Yes [provider]  amLODipine (NORVASC) 10 MG tablet Take 1 tablet (10 mg total) by mouth daily. Patient not taking: Reported on 07/10/2017 03/29/14   Jonetta Osgood, MD  hydrochlorothiazide (MICROZIDE) 12.5 MG capsule Take 2 capsules (25 mg total) by mouth daily. Patient not taking: Reported on 07/10/2017 03/29/14   Jonetta Osgood, MD  oxyCODONE (OXYCONTIN) 10 mg 12 hr tablet Take 1 tablet (10 mg total) by mouth every 12 (twelve) hours. For no more than 3 days. Patient not taking: Reported on 07/10/2017 09/05/16   Corky Sing, PA-C  senna (SENOKOT) 8.6 MG TABS tablet Take 1 tablet (8.6 mg total) by mouth at bedtime. Patient not taking: Reported on 07/10/2017 01/17/16   Everitt Amber, MD   Physical Exam: Vitals:   07/10/17 1300 07/10/17 1400 07/10/17 1430 07/10/17 1518  BP: (!) 104/56 (!) 99/49 (!) 97/52 (!) 98/54  Pulse:    71  Resp: 14 18 14 16   Temp:      TempSrc:      SpO2: 94% 97% 98% 98%   Constitutional: WN/WD Caucasian female in NAD and appears calm and  comfortable Eyes:  Lids and conjunctivae normal, sclerae anicteric  ENMT: External Ears, Nose appear normal. Grossly normal hearing. Mucous membranes are moist. Neck: Appears normal, supple, no cervical masses, normal ROM, no appreciable thyromegaly, no JVD Respiratory: Diminished to auscultation bilaterally, no wheezing, rales, rhonchi or crackles. Normal respiratory effort and patient is not tachypenic. No accessory muscle use.  Cardiovascular: RRR, no murmurs / rubs / gallops. S1 and S2 auscultated. No extremity edema.  Abdomen: Soft, non-tender, non-distended. No masses palpated. No appreciable hepatosplenomegaly. Bowel sounds positive.  GU: Deferred. Musculoskeletal: No clubbing / cyanosis of digits/nails. No joint deformity upper and lower extremities. Equal Strength in Upper Extremities  Skin: No rashes, lesions, ulcers on a limited skin eval. No induration; Warm and dry.  Neurologic: CN 2-12 grossly intact with no focal deficits. Sensation intact in all 4 Extremities; Romberg sign and cerebellar reflexes not assessed.  Psychiatric: Normal judgment and insight. Alert and oriented x 3. Normal mood and appropriate affect.   Labs on Admission: I have personally reviewed following labs and imaging studies  CBC: Recent Labs  Lab 07/10/17 1101  WBC 17.3*  NEUTROABS 16.3*  HGB 12.9  HCT 39.6  MCV 97.1  PLT 119   Basic Metabolic Panel: Recent Labs  Lab 07/10/17 1101  NA 138  K 4.2  CL 99*  CO2 27  GLUCOSE 121*  BUN 23*  CREATININE 1.25*  CALCIUM 9.4   GFR: CrCl cannot be calculated (Unknown ideal weight.). Liver Function Tests: Recent Labs  Lab 07/10/17 1104  AST 27  ALT 18  ALKPHOS 67  BILITOT 0.8  PROT 6.1*  ALBUMIN 3.4*   No results for input(s): LIPASE, AMYLASE in the last 168 hours. No results for input(s): AMMONIA in the last 168 hours. Coagulation Profile: No results for input(s): INR, PROTIME in the last 168 hours. Cardiac Enzymes: No results for  input(s): CKTOTAL, CKMB, CKMBINDEX, TROPONINI in the last 168 hours. BNP (last 3 results) No results for input(s): PROBNP in the last 8760 hours. HbA1C: No results for input(s): HGBA1C in the last 72 hours. CBG: Recent Labs  Lab 07/10/17 1057  GLUCAP 111*   Lipid Profile: No results for input(s): CHOL, HDL, LDLCALC, TRIG, CHOLHDL, LDLDIRECT in the last 72 hours. Thyroid Function Tests: No results for input(s): TSH, T4TOTAL, FREET4, T3FREE, THYROIDAB in the last 72 hours. Anemia Panel: No results  for input(s): VITAMINB12, FOLATE, FERRITIN, TIBC, IRON, RETICCTPCT in the last 72 hours. Urine analysis:    Component Value Date/Time   COLORURINE AMBER (A) 07/10/2017 1202   APPEARANCEUR CLOUDY (A) 07/10/2017 1202   LABSPEC 1.017 07/10/2017 1202   PHURINE 5.0 07/10/2017 1202   GLUCOSEU NEGATIVE 07/10/2017 1202   HGBUR SMALL (A) 07/10/2017 1202   BILIRUBINUR NEGATIVE 07/10/2017 1202   KETONESUR 5 (A) 07/10/2017 1202   PROTEINUR 30 (A) 07/10/2017 1202   UROBILINOGEN 0.2 03/25/2014 1142   NITRITE NEGATIVE 07/10/2017 1202   LEUKOCYTESUR LARGE (A) 07/10/2017 1202   Sepsis Labs: !!!!!!!!!!!!!!!!!!!!!!!!!!!!!!!!!!!!!!!!!!!! @LABRCNTIP (procalcitonin:4,lacticidven:4) )No results found for this or any previous visit (from the past 240 hour(s)).   Radiological Exams on Admission: Ct Abdomen Pelvis W Contrast  Result Date: 07/10/2017 CLINICAL DATA:  Diagnosed with urinary tract infection 2 days ago. EXAM: CT ABDOMEN AND PELVIS WITH CONTRAST TECHNIQUE: Multidetector CT imaging of the abdomen and pelvis was performed using the standard protocol following bolus administration of intravenous contrast. CONTRAST:  23mL ISOVUE-300 IOPAMIDOL (ISOVUE-300) INJECTION 61% COMPARISON:  May 16, 2017 FINDINGS: Lower chest: Mild atelectasis of the posterior lung bases are noted. Hepatobiliary: No focal lesion is identified within the liver. The gallbladder is normal. There is mild intra and extrahepatic  biliary ductal dilatation. The common bowel duct measures 11 mm. Pancreas: Unremarkable. No pancreatic ductal dilatation or surrounding inflammatory changes. Spleen: Normal in size without focal abnormality. Adrenals/Urinary Tract: The adrenal glands are normal. There are small nonobstructing stones in both kidneys. Bilateral parapelvic renal cysts are identified. There is no hydronephrosis bilaterally. Cortical cysts are identified in the left kidney. The bladder is normal. Stomach/Bowel: Stomach is within normal limits. The appendix is not seen. No inflammation is noted around the cecum. No evidence of bowel wall thickening, distention, or inflammatory changes. There is diverticulosis of colon without diverticulitis. Vascular/Lymphatic: Aortic atherosclerosis. No enlarged abdominal or pelvic lymph nodes. Reproductive: Status post hysterectomy. No adnexal masses. Other: Small bilateral inguinal herniation of mesenteric fat is identified. There is small umbilical herniation of mesenteric fat. Musculoskeletal: Degenerative and postop changes of the lumbar spine are noted. IMPRESSION: No acute abnormality identified in the abdomen and pelvis. Nonobstructing stones in bilateral kidneys. No hydronephrosis is noted bilaterally. Nonspecific mild dilatation of intra and extrahepatic biliary ducts. Electronically Signed   By: Abelardo Diesel M.D.   On: 07/10/2017 14:21   Dg Chest Portable 1 View  Result Date: 07/10/2017 CLINICAL DATA:  Hypotension EXAM: PORTABLE CHEST 1 VIEW COMPARISON:  May 15, 2017 FINDINGS: The heart size and mediastinal contours are stable. There is no focal infiltrate, pulmonary edema, or pleural effusion. The visualized skeletal structures are unremarkable. IMPRESSION: No active cardiopulmonary disease. Electronically Signed   By: Abelardo Diesel M.D.   On: 07/10/2017 11:24   EKG: Independently reviewed. Showed a Sinus Rhythm with a Rate of 81. No evidence of ST Elevation or Depression in my  interpretation.   Assessment/Plan Active Problems:   * No active hospital problems. *  Near Syncopal Episode with Dizziness and Transient Blurred Vision -In the Setting of Hypotension likely from Infection -Check Blood Cx and Urine Cx -IVF Rehydration -Check ECHOCardiogram, Troponin I x3, Head CT w/o Contrast, TSH and Orthostatic Vital Signs  -PT/OT Evaluation and Treatment -Blurred Vision and Dizziness improved  -C/w Telemetry   Acute Urinary Tract Infection -Patient's WBC was 17.3 on Admission  -Recently Saw PCP and was placed on Macrobid -Urinalysis looked dirty with Cloudy Urine Appearance, Small Hgb, Large Leukocytes, Many  Bacteria, TNTC WBC -Urine Cx and Blood Cx pending -Given IV Zosyn ion ED and will change to IV Cefepime  Lactic Acidosis -Improved -C/w IVF Rehydration   Mild AKI -In the setting of Dehydration and poor po intake -C/w IVF with NS at 100 mL/hr x 20 hours -Repeat CMP in AM   Generalized Weakness -PT/OT Evaluate and Treat   HLD -C/w Pravastatin 40 mg po qHS and Omega 3 Fatty Acids  HTN -Taken off of Most of Her Antihypertensives from PCP  -C/w Metoprolol Succinate 25 mg po Daily   Chronic Back Pain -C/w Oxycodone-Acetaminophen 10-325 mg TIDprn and Gabapentin 600 mg TID, and Duloxetine 60 mg po Daily   Depression and Anxiety -C/w Alprazolam 0.25 mg po TIDprn, Duloxetine, 60 mg po Daily, and Trazodone 100 mg po qHS  DVT prophylaxis: Enoxaparin 40 mg sq Code Status: FULL CODE Family Communication: No family present at bedside  Disposition Plan:  Pending PT Evaluation  Consults called: None Admission status: Inpatient Telemetry   Severity of Illness: The appropriate patient status for this patient is INPATIENT. Inpatient status is judged to be reasonable and necessary in order to provide the required intensity of service to ensure the patient's safety. The patient's presenting symptoms, physical exam findings, and initial radiographic and  laboratory data in the context of their chronic comorbidities is felt to place them at high risk for further clinical deterioration. Furthermore, it is not anticipated that the patient will be medically stable for discharge from the hospital within 2 midnights of admission. The following factors support the patient status of inpatient.   " The patient's presenting symptoms include Dizziness, Transient Blurred Vision, Generalized Weakness, Poor po Intake and Urinary Urgency. " The worrisome physical exam findings include Back Pain. " The initial radiographic and laboratory data are worrisome because of UTI. " The chronic co-morbidities include Chronic Pain, HTN, HLD.  * I certify that at the point of admission it is my clinical judgment that the patient will require inpatient hospital care spanning beyond 2 midnights from the point of admission due to high intensity of service, high risk for further deterioration and high frequency of surveillance required.Kerney Elbe, D.O. [facility] Pager [phone]  If 7PM-7AM, please contact night-coverage [email or web address] Password [facility]  07/10/2017, 4:39 PM

## 2017-07-10 NOTE — ED Notes (Signed)
ED TO INPATIENT HANDOFF REPORT  Name/Age/Gender Kimberly Larsen 72 y.o. female  Code Status Code Status History    Date Active Date Inactive Code Status Order ID Comments User Context   01/16/2016 15:40 01/17/2016 15:38 Full Code [id number]  Lahoma Crocker, MD Inpatient   03/25/2014 19:50 03/29/2014 18:36 Full Code [id number]  Orson Eva, MD Inpatient   02/23/2014 14:49 02/24/2014 13:31 Full Code [id number]  Melina Schools, MD Inpatient   01/19/2014 08:28 01/20/2014 03:31 Full Code [id number]  Jules Schick, MD HOV   08/31/2012 14:09 09/04/2012 16:52 Full Code [id number]  Pricilla Loveless, PA-C Inpatient    Advance Directive Documentation     Most Recent Value  Type of Advance Directive  Healthcare Power of Attorney, Living will  Pre-existing out of facility DNR order (yellow form or pink MOST form)  No data  "MOST" Form in Place?  No data      Home/SNF/Other Home  Chief Complaint weakness   Level of Care/Admitting Diagnosis ED Disposition    ED Disposition Condition Comment   Admit  Hospital Area: [place] [facility] [[id number]]  Level of Care: Telemetry [5]  Admit to tele based on following criteria: Eval of Syncope  Diagnosis: UTI (urinary tract infection) [606301]  Admitting Physician: [place], Anthony [[id number]]  Attending Physician: Raiford Noble LATIF [[id number]]  Estimated length of stay: past midnight tomorrow  Certification:: I certify this patient will need inpatient services for at least 2 midnights  PT Class (Do Not Modify): Inpatient [101]  PT Acc Code (Do Not Modify): Private [1]       Medical History Past Medical History:  Diagnosis Date  . Anxiety   . Arthritis   . Back pain   . Cancer [facility])    endometrial cancer  . DDD (degenerative disc disease), lumbar   . Depression   . Gastroparesis   . History of kidney stones   . HLD (hyperlipidemia)   . Hx of sepsis 02/2014   DUE TO MULTIPLE KIDNEY STONES  . Hypertension   . Osteoporosis   .  Pneumonia    2014  . UTI (urinary tract infection)     Allergies Allergies  Allergen Reactions  . Augmentin [Amoxicillin-Pot Clavulanate] Nausea Only  . Wellbutrin [Bupropion] Nausea Only    IV Location/Drains/Wounds Patient Lines/Drains/Airways Status   Active Line/Drains/Airways    Name:   Placement date:   Placement time:   Site:   Days:   Peripheral IV 07/10/17 Left Hand   07/10/17    1106    Hand   less than 1   Ureteral Drain/Stent Left ureter 6 Fr.   04/18/14    1030    Left ureter   1179   Ureteral Drain/Stent Right ureter 6 Fr.   04/18/14    1101    Right ureter   1179   Ureteral Drain/Stent Right ureter 6 Fr.   05/30/14    1509    Right ureter   1137   Incision (Closed) 01/16/16 Abdomen Other (Comment)   01/16/16    1338     541   Incision (Closed) 09/05/16 Foot Left   09/05/16    1200     308   Incision - 5 Ports Abdomen 1: Umbilicus 2: Medial;Umbilicus;Left 3: Left;Lateral;Umbilicus 4: Left;Upper;Umbilicus 5: Right;Umbilicus   02/54/27    0623     541          Labs/Imaging Results for orders placed or performed during the hospital encounter  of 07/10/17 (from the past 48 hour(s))  CBG monitoring, ED     Status: Abnormal   Collection Time: 07/10/17 10:57 AM  Result Value Ref Range   Glucose-Capillary 111 (H) 65 - 99 mg/dL  Basic metabolic panel     Status: Abnormal   Collection Time: 07/10/17 11:01 AM  Result Value Ref Range   Sodium 138 135 - 145 mmol/L   Potassium 4.2 3.5 - 5.1 mmol/L   Chloride 99 (L) 101 - 111 mmol/L   CO2 27 22 - 32 mmol/L   Glucose, Bld 121 (H) 65 - 99 mg/dL   BUN 23 (H) 6 - 20 mg/dL   Creatinine, Ser 1.25 (H) 0.44 - 1.00 mg/dL   Calcium 9.4 8.9 - 10.3 mg/dL   GFR calc non Af Amer 43 (L) >60 mL/min   GFR calc Af Amer 50 (L) >60 mL/min    Comment: (NOTE) The eGFR has been calculated using the CKD EPI equation. This calculation has not been validated in all clinical situations. eGFR's persistently <60 mL/min signify possible Chronic  Kidney Disease.    Anion gap 12 5 - 15    Comment: Performed at [facility], [place] [address], [place], [place] [address]  CBC     Status: Abnormal   Collection Time: 07/10/17 11:01 AM  Result Value Ref Range   WBC 17.3 (H) 4.0 - 10.5 K/uL   RBC 4.08 3.87 - 5.11 MIL/uL   Hemoglobin 12.9 12.0 - 15.0 g/dL   HCT 39.6 36.0 - 46.0 %   MCV 97.1 78.0 - 100.0 fL   MCH 31.6 26.0 - 34.0 pg   MCHC 32.6 30.0 - 36.0 g/dL   RDW 13.8 11.5 - 15.5 %   Platelets 228 150 - 400 K/uL    Comment: Performed at [facility], [place] [address], [place], [place] [address]  Differential     Status: Abnormal   Collection Time: 07/10/17 11:01 AM  Result Value Ref Range   Neutrophils Relative % 94 %   Neutro Abs 16.3 (H) 1.7 - 7.7 K/uL   Lymphocytes Relative 3 %   Lymphs Abs 0.4 (L) 0.7 - 4.0 K/uL   Monocytes Relative 3 %   Monocytes Absolute 0.5 0.1 - 1.0 K/uL   Eosinophils Relative 0 %   Eosinophils Absolute 0.1 0.0 - 0.7 K/uL   Basophils Relative 0 %   Basophils Absolute 0.0 0.0 - 0.1 K/uL    Comment: Performed at [facility], [place] [address] [place], [place] [address]  Hepatic function panel     Status: Abnormal   Collection Time: 07/10/17 11:04 AM  Result Value Ref Range   Total Protein 6.1 (L) 6.5 - 8.1 g/dL   Albumin 3.4 (L) 3.5 - 5.0 g/dL   AST 27 15 - 41 U/L   ALT 18 14 - 54 U/L   Alkaline Phosphatase 67 38 - 126 U/L   Total Bilirubin 0.8 0.3 - 1.2 mg/dL   Bilirubin, Direct 0.2 0.1 - 0.5 mg/dL   Indirect Bilirubin 0.6 0.3 - 0.9 mg/dL    Comment: Performed at [facility], [place] [address], [place], [place] [address]  TSH     Status: None   Collection Time: 07/10/17 11:06 AM  Result Value Ref Range   TSH 0.597 0.350 - 4.500 uIU/mL    Comment: Performed by a 3rd Generation assay with a functional sensitivity of <=0.01 uIU/mL. Performed at [facility], [place]  [address], [place], [place] [address]    I-Stat CG4 Lactic Acid, ED     Status: Abnormal   Collection Time: 07/10/17 11:09 AM  Result Value Ref Range   Lactic Acid, Venous 2.12 (HH) 0.5 - 1.9 mmol/L   Comment NOTIFIED PHYSICIAN   Urinalysis, Routine w reflex microscopic     Status: Abnormal   Collection Time: 07/10/17 12:02 PM  Result Value Ref Range   Color, Urine AMBER (A) YELLOW    Comment: BIOCHEMICALS MAY BE AFFECTED BY COLOR   APPearance CLOUDY (A) CLEAR   Specific Gravity, Urine 1.017 1.005 - 1.030   pH 5.0 5.0 - 8.0   Glucose, UA NEGATIVE NEGATIVE mg/dL   Hgb urine dipstick SMALL (A) NEGATIVE   Bilirubin Urine NEGATIVE NEGATIVE   Ketones, ur 5 (A) NEGATIVE mg/dL   Protein, ur 30 (A) NEGATIVE mg/dL   Nitrite NEGATIVE NEGATIVE   Leukocytes, UA LARGE (A) NEGATIVE   RBC / HPF 6-30 0 - 5 RBC/hpf   WBC, UA TOO NUMEROUS TO COUNT 0 - 5 WBC/hpf   Bacteria, UA MANY (A) NONE SEEN   Squamous Epithelial / LPF 0-5 (A) NONE SEEN   WBC Clumps PRESENT    Mucus PRESENT    Hyaline Casts, UA PRESENT     Comment: Performed at [facility], [place] [address] [place], [place] [address]  I-Stat CG4 Lactic Acid, ED     Status: None   Collection Time: 07/10/17  1:15 PM  Result Value Ref Range   Lactic Acid, Venous 0.65 0.5 - 1.9 mmol/L   Ct Head Wo Contrast  Result Date: 07/10/2017 CLINICAL DATA:  Dizziness with blurred vision EXAM: CT HEAD WITHOUT CONTRAST TECHNIQUE: Contiguous axial images were obtained from the base of the skull through the vertex without intravenous contrast. COMPARISON:  None. FINDINGS: Brain: The ventricles appear normal in size and configuration. There is a focal mass at the anterior third ventricular level showing increased attenuation. This mass measures 0.8 x 0.8 x 0.6 cm. 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 There is a mass arising from the pituitary. This mass extends into the suprasellar region to the level of the optic chiasm. This mass extends leftward  and invades the cavernous sinus on the left. There is bony remodeling on the left in this area. This mass measures 2.2 cm from superior to inferior dimension, 2.8 cm from right to left dimension, and 1.7 cm from anterior to posterior dimension. Posteriorly, this mass causes a degree of clival remodeling. No other masses are evident. There is no hemorrhage. No midline shift or extra-axial fluid collection. There is slight small vessel disease in the centra semiovale bilaterally. No acute infarct is evident. Vascular: No hyperdense vessel. There is calcification in each carotid siphon. Skull: Bony calvarium appears intact except for the changes in the perisellar region on the left and clivus due to the pituitary mass. Sinuses/Orbits: There is mucosal thickening in several ethmoid air cells. Other visualized paranasal sinuses are clear. Orbits appear symmetric bilaterally. Other: Mastoid air cells are clear. IMPRESSION: 1. Mass arising from the sella extending to the left and superiorly. The most likely differential consideration for this mass is a pituitary macroadenoma. Other lesions including glioma, craniopharyngioma, and metastasis are possibilities for this lesion. Note that there is no appreciable calcification in this mass. This lesion measures approximately 2.2 x 2.8 x 1.7 cm. 2. 8 x 8 x 6 mm focus of decreased attenuation in the anterior  third ventricle. 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 3.  No appreciable hydrocephalus. 4. Mild periventricular small vessel disease. No acute hemorrhage or acute infarct. 5.  There are foci of arterial vascular calcification. 6.  Mucosal thickening in several ethmoid air cells. Comment: Nonemergent MR to evaluate the pituitary and third ventricular region masses pre and post-contrast advised. Electronically Signed   By: Lowella Grip III M.D.   On: 07/10/2017 19:02   Ct Abdomen Pelvis W Contrast  Result Date: 07/10/2017 CLINICAL  DATA:  Diagnosed with urinary tract infection 2 days ago. EXAM: CT ABDOMEN AND PELVIS WITH CONTRAST TECHNIQUE: Multidetector CT imaging of the abdomen and pelvis was performed using the standard protocol following bolus administration of intravenous contrast. CONTRAST:  67m ISOVUE-300 IOPAMIDOL (ISOVUE-300) INJECTION 61% COMPARISON:  May 16, 2017 FINDINGS: Lower chest: Mild atelectasis of the posterior lung bases are noted. Hepatobiliary: No focal lesion is identified within the liver. The gallbladder is normal. There is mild intra and extrahepatic biliary ductal dilatation. The common bowel duct measures 11 mm. Pancreas: Unremarkable. No pancreatic ductal dilatation or surrounding inflammatory changes. Spleen: Normal in size without focal abnormality. Adrenals/Urinary Tract: The adrenal glands are normal. There are small nonobstructing stones in both kidneys. Bilateral parapelvic renal cysts are identified. There is no hydronephrosis bilaterally. Cortical cysts are identified in the left kidney. The bladder is normal. Stomach/Bowel: Stomach is within normal limits. The appendix is not seen. No inflammation is noted around the cecum. No evidence of bowel wall thickening, distention, or inflammatory changes. There is diverticulosis of colon without diverticulitis. Vascular/Lymphatic: Aortic atherosclerosis. No enlarged abdominal or pelvic lymph nodes. Reproductive: Status post hysterectomy. No adnexal masses. Other: Small bilateral inguinal herniation of mesenteric fat is identified. There is small umbilical herniation of mesenteric fat. Musculoskeletal: Degenerative and postop changes of the lumbar spine are noted. IMPRESSION: No acute abnormality identified in the abdomen and pelvis. Nonobstructing stones in bilateral kidneys. No hydronephrosis is noted bilaterally. Nonspecific mild dilatation of intra and extrahepatic biliary ducts. Electronically Signed   By: WAbelardo DieselM.D.   On: 07/10/2017 14:21   Dg  Chest Portable 1 View  Result Date: 07/10/2017 CLINICAL DATA:  Hypotension EXAM: PORTABLE CHEST 1 VIEW COMPARISON:  May 15, 2017 FINDINGS: The heart size and mediastinal contours are stable. There is no focal infiltrate, pulmonary edema, or pleural effusion. The visualized skeletal structures are unremarkable. IMPRESSION: No active cardiopulmonary disease. Electronically Signed   By: WAbelardo DieselM.D.   On: 07/10/2017 11:24    Pending Labs Unresulted Labs (From admission, onward)   Start     Ordered   07/11/17 0500  CBC with Differential/Platelet  Tomorrow morning,   R     07/10/17 2041   07/11/17 0500  Comprehensive metabolic panel  Tomorrow morning,   R     07/10/17 2041   07/11/17 0500  Magnesium  Tomorrow morning,   R     07/10/17 2041   07/11/17 0500  Phosphorus  Tomorrow morning,   R     07/10/17 2041   07/10/17 1104  Urine culture  STAT,   STAT     07/10/17 1103   Signed and Held  CBC  (enoxaparin (LOVENOX)    CrCl >/= 30 ml/min)  Once,   R    Comments:  Baseline for enoxaparin therapy IF NOT ALREADY DRAWN.  Notify MD if PLT < 100 K.    Signed and Held   Signed and Held  Creatinine, serum  (enoxaparin (LOVENOX)    CrCl >/= 30 ml/min)  Once,   R    Comments:  Baseline for enoxaparin therapy IF NOT ALREADY DRAWN.    Signed and Held   Signed and Held  Creatinine, serum  (enoxaparin (LOVENOX)    CrCl >/= 30 ml/min)  Weekly,   R    Comments:  while on enoxaparin therapy    Signed and Held   Signed and Held  Comprehensive metabolic panel  Tomorrow morning,   R     Signed and Held   Signed and Held  CBC  Tomorrow morning,   R     Signed and Held   Signed and Held  Troponin I (q 6hr x 3)  Now then every 6 hours,   R     Signed and Held      Vitals/Pain Today's Vitals   07/10/17 2045 07/10/17 2100 07/10/17 2130 07/10/17 2200  BP: 119/61 98/62 (!) 105/48 115/60  Pulse:      Resp: (!) 21 (!) _0 Temp:      TempSrc:      SpO2: 100% 96% 100% 94%    Isolation  Precautions No active isolations  Medications Medications  ceFEPIme (MAXIPIME) 2 g in sodium chloride 0.9 % 100 mL IVPB (0 g Intravenous Stopped 07/10/17 2040)  sodium chloride 0.9 % bolus 1,000 mL (0 mLs Intravenous Stopped 07/10/17 1201)  piperacillin-tazobactam (ZOSYN) IVPB 3.375 g (0 g Intravenous Stopped 07/10/17 1243)  iopamidol (ISOVUE-300) 61 % injection 75 mL (75 mLs Intravenous Contrast Given 07/10/17 1329)  sodium chloride 0.9 % bolus 1,000 mL (0 mLs Intravenous Stopped 07/10/17 1900)    Mobility walks

## 2017-07-10 NOTE — ED Triage Notes (Addendum)
Per EMS- pt called with c/o weakness x 4 hours and blurred vision. Denies NV.Denies syncope. Pt lives by herself. Dx 2 days ago with UTI.-by PCP , Dr Laqueta Due. Has taken one dosage. Pt is alert, oriented and ambulatory. Pt stated that she saw her doctor last Friday and has taken one pill for her UTI. She had difficulty obtaining medication. Reported that blurred vision stated today

## 2017-07-10 NOTE — ED Provider Notes (Signed)
[place] DEPT Provider Note   CSN: [id number] Arrival date & time: 07/10/17  1028     History   Chief Complaint Chief Complaint  Patient presents with  . Weakness  . Blurred Vision  . Hypotension    HPI Kimberly Larsen is a 72 y.o. female.  HPI Patient presents with lightheadedness and dizziness.  Began this morning when she got up.  States she felt like passing out and vision got blurred.  2 days ago got diagnosed with a urinary tract infection by her primary care doctor.  Has had 1 dose of Macrobid.  States she has seen her urologist also.  No fevers.  No chills.  Has had a mildly decreased oral intake.  Has some chronic flank pain and chronic kidney stones.  No cough.  No nausea or vomiting.  No diarrhea. Past Medical History:  Diagnosis Date  . Anxiety   . Arthritis   . Back pain   . Cancer [facility])    endometrial cancer  . DDD (degenerative disc disease), lumbar   . Depression   . Gastroparesis   . History of kidney stones   . HLD (hyperlipidemia)   . Hx of sepsis 02/2014   DUE TO MULTIPLE KIDNEY STONES  . Hypertension   . Osteoporosis   . Pneumonia    2014  . UTI (urinary tract infection)     Patient Active Problem List   Diagnosis Date Noted  . Endometrial cancer ([place]) 01/10/2016  . Severe sepsis with septic shock ([place]) 03/25/2014  . AKI (acute kidney injury) ([place]) 03/25/2014  . Benign essential HTN 03/25/2014  . Obstructive uropathy 03/25/2014  . Hyperlipidemia 03/25/2014  . Acute postoperative respiratory failure ([place]) 03/25/2014  . Chronic pain 02/23/2014  . Fibromyalgia 09/08/2012  . Insomnia 09/08/2012  . Obesity (BMI 30-39.9) 09/02/2012  . Expected blood loss anemia 09/02/2012  . S/P right TKA 08/31/2012  . HYPERTENSION, BENIGN SYSTEMIC 06/26/2006  . NEPHROLITHIASIS 06/26/2006    Past Surgical History:  Procedure Laterality Date  . BACK SURGERY  2013  . CARPAL TUNNEL RELEASE     R hand  . CYSTOSCOPY WITH  URETEROSCOPY AND STENT PLACEMENT Bilateral 03/25/2014   Procedure: CYSTOSCOPY WITH URETEROSCOPY AND STENT PLACEMENT;  Surgeon: Raynelle Bring, MD;  Location: [facility];  Service: Urology;  Laterality: Bilateral;  . CYSTOSCOPY WITH URETEROSCOPY AND STENT PLACEMENT Bilateral 04/18/2014   Procedure: CYSTOSCOPY WITH URETEROSCOPY AND STENT PLACEMENT,RETROGRADE;  Surgeon: Raynelle Bring, MD;  Location: [facility];  Service: Urology;  Laterality: Bilateral;  . CYSTOSCOPY WITH URETEROSCOPY AND STENT PLACEMENT Right 05/30/2014   Procedure: CYSTOSCOPY WITH URETEROSCOPY AND STENT PLACEMENT;  Surgeon: Raynelle Bring, MD;  Location: [facility];  Service: Urology;  Laterality: Right;  . CYSTOSCOPY/RETROGRADE/URETEROSCOPY Left 05/30/2014   Procedure: LEFT RETROGRADE;  Surgeon: Raynelle Bring, MD;  Location: [facility];  Service: Urology;  Laterality: Left;  . EYE SURGERY     cataract surgery bil  . GANGLION CYST EXCISION     L ankle  . HAMMERTOE RECONSTRUCTION WITH WEIL OSTEOTOMY Left 09/05/2016   Procedure: Second Metatarsal Weil Osteotomy and Hammertoe Correction;  Surgeon: Wylene Simmer, MD;  Location: [place];  Service: Orthopedics;  Laterality: Left;  . HOLMIUM LASER APPLICATION Bilateral 56/38/7564   Procedure: HOLMIUM LASER APPLICATION;  Surgeon: Raynelle Bring, MD;  Location: [facility];  Service: Urology;  Laterality: Bilateral;  . JOINT REPLACEMENT     total knee  . LITHOTRIPSY    . LUMBAR FUSION  09/2011  . METATARSAL OSTEOTOMY WITH BUNIONECTOMY Left 09/05/2016   Procedure: Left First Metatarsal Scarf Osteotomy, Modified McBride Bunion Correction;  Surgeon: Wylene Simmer, MD;  Location: [place];  Service: Orthopedics;  Laterality: Left;  . RHINOPLASTY    . ROBOTIC ASSISTED TOTAL HYSTERECTOMY WITH BILATERAL SALPINGO OOPHERECTOMY Bilateral 01/16/2016   Procedure: XI ROBOTIC ASSISTED TOTAL HYSTERECTOMY WITH BILATERAL SALPINGO OOPHORECTOMY AND BILATERAL PELVIC LYMPH NODE DISSECTION;  Surgeon: Everitt Amber, MD;  Location[facility];  Service: Gynecology;  Laterality: Bilateral;  . SPINAL CORD STIMULATOR INSERTION N/A 02/23/2014   Procedure: SPINAL CORD STIMULATOR PLACEMENT ;  Surgeon: Melina Schools, MD;  Location: [place];  Service: Orthopedics;  Laterality: N/A;  . TONSILLECTOMY    . TOTAL KNEE ARTHROPLASTY Right 08/31/2012   Procedure: RIGHT TOTAL KNEE ARTHROPLASTY;  Surgeon: Mauri Pole, MD;  Location[facility];  Service: Orthopedics;  Laterality: Right;  with LMA  . TUBAL LIGATION      OB History    No data available       Home Medications    Prior to Admission medications   Medication Sig Start Date End Date Taking? Authorizing Provider  alendronate (FOSAMAX) 70 MG tablet Take 70 mg by mouth once a week. 06/16/17  Yes [provider]  ALPRAZolam (XANAX) 0.25 MG tablet Take 0.25 mg by mouth 3 (three) times daily as needed. 05/30/17  Yes [provider]  aspirin EC 81 MG tablet Take 81 mg by mouth daily.   Yes [provider]  B Complex Vitamins (B COMPLEX PO) Take 1 tablet by mouth daily.   Yes [provider]  docusate sodium (COLACE) 100 MG capsule Take 1 capsule (100 mg total) by mouth 2 (two) times daily. While taking narcotic pain medicine. 09/05/16  Yes Corky Sing, PA-C  DULoxetine (CYMBALTA) 60 MG capsule Take 60 mg by mouth daily.   Yes [provider]  fluticasone (FLONASE) 50 MCG/ACT nasal spray Place 1 spray into both nostrils daily.   Yes [provider]  gabapentin (NEURONTIN) 600 MG tablet Take 600 mg by mouth 3 (three) times daily.   Yes [provider]  Melatonin 10 MG CAPS Take 10 mg by mouth at bedtime.   Yes [provider]  metoprolol succinate (TOPROL-XL) 25 MG 24 hr tablet Take 25 mg by mouth daily.  06/15/14  Yes [provider]  Multiple Vitamin (MULTIVITAMIN WITH MINERALS) TABS Take 1 tablet by mouth daily. 09/11/12  Yes Judeth Cornfield, NP  Multiple Vitamins-Minerals (HAIR SKIN  AND NAILS FORMULA PO) Take 1 tablet by mouth daily.   Yes [provider]  nitrofurantoin, macrocrystal-monohydrate, (MACROBID) 100 MG capsule Take 100 mg by mouth 2 (two) times daily. 07/09/17  Yes [provider]  Omega-3 Fatty Acids (FISH OIL) 600 MG CAPS Take 1 capsule by mouth 2 (two) times daily. 09/11/12  Yes Judeth Cornfield, NP  oxyCODONE-acetaminophen (PERCOCET) 10-325 MG tablet Take 1 tablet by mouth 3 (three) times daily as needed for pain. 06/30/17  Yes [provider]  pravastatin (PRAVACHOL) 40 MG tablet Take 1 tablet (40 mg total) by mouth at bedtime. 09/11/12  Yes Judeth Cornfield, NP  traZODone (DESYREL) 100 MG tablet Take 100 mg by mouth at bedtime.   Yes [provider]  amLODipine (NORVASC) 10 MG tablet Take 1 tablet (10 mg total) by mouth daily. Patient not taking: Reported on 07/10/2017 03/29/14   Jonetta Osgood, MD  hydrochlorothiazide (MICROZIDE) 12.5 MG capsule Take 2  capsules (25 mg total) by mouth daily. Patient not taking: Reported on 07/10/2017 03/29/14   Jonetta Osgood, MD  oxyCODONE (OXYCONTIN) 10 mg 12 hr tablet Take 1 tablet (10 mg total) by mouth every 12 (twelve) hours. For no more than 3 days. Patient not taking: Reported on 07/10/2017 09/05/16   Corky Sing, PA-C  senna (SENOKOT) 8.6 MG TABS tablet Take 1 tablet (8.6 mg total) by mouth at bedtime. Patient not taking: Reported on 07/10/2017 01/17/16   Everitt Amber, MD    Family History Family History  Problem Relation Age of Onset  . Stroke Mother   . Stroke Father     Social History Social History   Tobacco Use  . Smoking status: Never Smoker  . Smokeless tobacco: Never Used  Substance Use Topics  . Alcohol use: No  . Drug use: No     Allergies   Augmentin [amoxicillin-pot clavulanate] and Wellbutrin [bupropion]   Review of Systems Review of Systems  Constitutional: Negative for fever.  HENT: Negative for congestion.   Eyes: Positive for visual  disturbance.  Respiratory: Negative for cough.   Cardiovascular: Negative for chest pain.  Gastrointestinal: Negative for abdominal pain.  Genitourinary: Positive for flank pain. Negative for dysuria.  Musculoskeletal: Negative for back pain.  Neurological: Positive for light-headedness. Negative for headaches.  Psychiatric/Behavioral: Negative for confusion.     Physical Exam Updated Vital Signs BP (!) 98/54   Pulse 71   Temp 99.9 F (37.7 C) (Rectal)   Resp 16   SpO2 98%   Physical Exam  Constitutional: She appears well-developed.  HENT:  Head: Atraumatic.  Eyes: Pupils are equal, round, and reactive to light.  Neck: Neck supple.  Cardiovascular: Normal rate.  Pulmonary/Chest: Effort normal.  Abdominal: There is no tenderness.  Genitourinary:  Genitourinary Comments: No CVA tenderness  Musculoskeletal: She exhibits no tenderness.  Neurological: She is alert.  Skin: Skin is warm. Capillary refill takes less than 2 seconds.  Psychiatric: She has a normal mood and affect.     ED Treatments / Results  Labs (all labs ordered are listed, but only abnormal results are displayed) Labs Reviewed  BASIC METABOLIC PANEL - Abnormal; Notable for the following components:      Result Value   Chloride 99 (*)    Glucose, Bld 121 (*)    BUN 23 (*)    Creatinine, Ser 1.25 (*)    GFR calc non Af Amer 43 (*)    GFR calc Af Amer 50 (*)    All other components within normal limits  CBC - Abnormal; Notable for the following components:   WBC 17.3 (*)    All other components within normal limits  URINALYSIS, ROUTINE W REFLEX MICROSCOPIC - Abnormal; Notable for the following components:   Color, Urine AMBER (*)    APPearance CLOUDY (*)    Hgb urine dipstick SMALL (*)    Ketones, ur 5 (*)    Protein, ur 30 (*)    Leukocytes, UA LARGE (*)    Bacteria, UA MANY (*)    Squamous Epithelial / LPF 0-5 (*)    All other components within normal limits  HEPATIC FUNCTION PANEL - Abnormal;  Notable for the following components:   Total Protein 6.1 (*)    Albumin 3.4 (*)    All other components within normal limits  DIFFERENTIAL - Abnormal; Notable for the following components:   Neutro Abs 16.3 (*)    Lymphs Abs 0.4 (*)  All other components within normal limits  CBG MONITORING, ED - Abnormal; Notable for the following components:   Glucose-Capillary 111 (*)    All other components within normal limits  I-STAT CG4 LACTIC ACID, ED - Abnormal; Notable for the following components:   Lactic Acid, Venous 2.12 (*)    All other components within normal limits  URINE CULTURE  I-STAT CG4 LACTIC ACID, ED    EKG  EKG Interpretation None       Radiology Ct Abdomen Pelvis W Contrast  Result Date: 07/10/2017 CLINICAL DATA:  Diagnosed with urinary tract infection 2 days ago. EXAM: CT ABDOMEN AND PELVIS WITH CONTRAST TECHNIQUE: Multidetector CT imaging of the abdomen and pelvis was performed using the standard protocol following bolus administration of intravenous contrast. CONTRAST:  93mL ISOVUE-300 IOPAMIDOL (ISOVUE-300) INJECTION 61% COMPARISON:  May 16, 2017 FINDINGS: Lower chest: Mild atelectasis of the posterior lung bases are noted. Hepatobiliary: No focal lesion is identified within the liver. The gallbladder is normal. There is mild intra and extrahepatic biliary ductal dilatation. The common bowel duct measures 11 mm. Pancreas: Unremarkable. No pancreatic ductal dilatation or surrounding inflammatory changes. Spleen: Normal in size without focal abnormality. Adrenals/Urinary Tract: The adrenal glands are normal. There are small nonobstructing stones in both kidneys. Bilateral parapelvic renal cysts are identified. There is no hydronephrosis bilaterally. Cortical cysts are identified in the left kidney. The bladder is normal. Stomach/Bowel: Stomach is within normal limits. The appendix is not seen. No inflammation is noted around the cecum. No evidence of bowel wall  thickening, distention, or inflammatory changes. There is diverticulosis of colon without diverticulitis. Vascular/Lymphatic: Aortic atherosclerosis. No enlarged abdominal or pelvic lymph nodes. Reproductive: Status post hysterectomy. No adnexal masses. Other: Small bilateral inguinal herniation of mesenteric fat is identified. There is small umbilical herniation of mesenteric fat. Musculoskeletal: Degenerative and postop changes of the lumbar spine are noted. IMPRESSION: No acute abnormality identified in the abdomen and pelvis. Nonobstructing stones in bilateral kidneys. No hydronephrosis is noted bilaterally. Nonspecific mild dilatation of intra and extrahepatic biliary ducts. Electronically Signed   By: Abelardo Diesel M.D.   On: 07/10/2017 14:21   Dg Chest Portable 1 View  Result Date: 07/10/2017 CLINICAL DATA:  Hypotension EXAM: PORTABLE CHEST 1 VIEW COMPARISON:  May 15, 2017 FINDINGS: The heart size and mediastinal contours are stable. There is no focal infiltrate, pulmonary edema, or pleural effusion. The visualized skeletal structures are unremarkable. IMPRESSION: No active cardiopulmonary disease. Electronically Signed   By: Abelardo Diesel M.D.   On: 07/10/2017 11:24    Procedures Procedures (including critical care time)  Medications Ordered in ED Medications  sodium chloride 0.9 % bolus 1,000 mL (not administered)  sodium chloride 0.9 % bolus 1,000 mL (0 mLs Intravenous Stopped 07/10/17 1201)  piperacillin-tazobactam (ZOSYN) IVPB 3.375 g (0 g Intravenous Stopped 07/10/17 1243)  iopamidol (ISOVUE-300) 61 % injection 75 mL (75 mLs Intravenous Contrast Given 07/10/17 1329)     Initial Impression / Assessment and Plan / ED Course  I have reviewed the triage vital signs and the nursing notes.  Pertinent labs & imaging results that were available during my care of the patient were reviewed by me and considered in my medical decision making (see chart for details).     Patient presents  with weakness and blurred vision.  Hypotensive.  Has been diagnosed with urinary tract infection has been on Macrobid.  Appears to have urinary tract infection here.  Initial lactic acid elevated but second 1 returns to  normal.  Blood pressure improved somewhat but has not completely normalized.  White count is elevated.  CT scan done due to previous kidney stones and does not show a urinary obstruction.  Feels somewhat better after some IV fluids.  Will admit to hospitalist.  Has had decreased oral intake and I think dehydration has a component for her hypotension.  Given Zosyn for the UTI and culture sent.  Not empirically treated as a severe sepsis.  Final Clinical Impressions(s) / ED Diagnoses   Final diagnoses:  Lower urinary tract infectious disease  Hypotension, unspecified hypotension type    ED Discharge Orders    None       Davonna Belling, MD 07/10/17 1600

## 2017-07-10 NOTE — ED Notes (Signed)
Bed: [facility] Expected date:  Expected time:  Means of arrival:  Comments: 

## 2017-07-10 NOTE — Progress Notes (Signed)
Pharmacy Antibiotic Note  Kimberly Larsen is a 72 y.o. female admitted on 07/10/2017 with UTI.  Pharmacy has been consulted for cefepime dosing.  Plan: Cefepime 2g IV q24    Temp (24hrs), Avg:99.3 F (37.4 C), Min:98.7 F (37.1 C), Max:99.9 F (37.7 C)  Recent Labs  Lab 07/10/17 1101 07/10/17 1109 07/10/17 1315  WBC 17.3*  --   --   CREATININE 1.25*  --   --   LATICACIDVEN  --  2.12* 0.65    CrCl cannot be calculated (Unknown ideal weight.).    Allergies  Allergen Reactions  . Augmentin [Amoxicillin-Pot Clavulanate] Nausea Only  . Wellbutrin [Bupropion] Nausea Only     Thank you for allowing pharmacy to be a part of this patient's care.  Adrian Saran, PharmD, BCPS Pager [phone] 07/10/2017 6:09 PM

## 2017-07-11 ENCOUNTER — Inpatient Hospital Stay (HOSPITAL_COMMUNITY): Payer: Medicare Other

## 2017-07-11 DIAGNOSIS — I361 Nonrheumatic tricuspid (valve) insufficiency: Secondary | ICD-10-CM

## 2017-07-11 LAB — CBC WITH DIFFERENTIAL/PLATELET
Basophils Absolute: 0 10*3/uL (ref 0.0–0.1)
Basophils Relative: 0 %
Eosinophils Absolute: 0.4 10*3/uL (ref 0.0–0.7)
Eosinophils Relative: 4 %
HCT: 33.5 % — ABNORMAL LOW (ref 36.0–46.0)
Hemoglobin: 10.6 g/dL — ABNORMAL LOW (ref 12.0–15.0)
Lymphocytes Relative: 11 %
Lymphs Abs: 0.9 10*3/uL (ref 0.7–4.0)
MCH: 31.4 pg (ref 26.0–34.0)
MCHC: 31.6 g/dL (ref 30.0–36.0)
MCV: 99.1 fL (ref 78.0–100.0)
Monocytes Absolute: 0.4 10*3/uL (ref 0.1–1.0)
Monocytes Relative: 4 %
Neutro Abs: 6.9 10*3/uL (ref 1.7–7.7)
Neutrophils Relative %: 81 %
Platelets: 154 10*3/uL (ref 150–400)
RBC: 3.38 MIL/uL — ABNORMAL LOW (ref 3.87–5.11)
RDW: 14.6 % (ref 11.5–15.5)
WBC: 8.5 10*3/uL (ref 4.0–10.5)

## 2017-07-11 LAB — TROPONIN I
Troponin I: <0.03 ng/mL (ref <0.03)
Troponin I: <0.03 ng/mL (ref <0.03)

## 2017-07-11 LAB — COMPREHENSIVE METABOLIC PANEL
ALT: 15 U/L (ref 14–54)
AST: 17 U/L (ref 15–41)
Albumin: 2.7 g/dL — ABNORMAL LOW (ref 3.5–5.0)
Alkaline Phosphatase: 53 U/L (ref 38–126)
Anion gap: 7 (ref 5–15)
BUN: 21 mg/dL — ABNORMAL HIGH (ref 6–20)
CO2: 27 mmol/L (ref 22–32)
Calcium: 8.3 mg/dL — ABNORMAL LOW (ref 8.9–10.3)
Chloride: 107 mmol/L (ref 101–111)
Creatinine, Ser: 0.86 mg/dL (ref 0.44–1.00)
GFR calc Af Amer: >60 mL/min (ref >60)
GFR calc non Af Amer: >60 mL/min (ref >60)
Glucose, Bld: 96 mg/dL (ref 65–99)
Potassium: 4 mmol/L (ref 3.5–5.1)
Sodium: 141 mmol/L (ref 135–145)
Total Bilirubin: 0.6 mg/dL (ref 0.3–1.2)
Total Protein: 5.1 g/dL — ABNORMAL LOW (ref 6.5–8.1)

## 2017-07-11 LAB — MAGNESIUM: Magnesium: 2 mg/dL (ref 1.7–2.4)

## 2017-07-11 LAB — ECHOCARDIOGRAM COMPLETE
Height: 61 in
Weight: 3092.8 oz

## 2017-07-11 LAB — PHOSPHORUS: Phosphorus: 2.9 mg/dL (ref 2.5–4.6)

## 2017-07-11 MED ORDER — SODIUM CHLORIDE 0.9 % IV SOLN
2.0000 g | Freq: Two times a day (BID) | INTRAVENOUS | Status: DC
  Administered 2017-07-11 – 2017-07-12 (×3): 2 g via INTRAVENOUS
  Filled 2017-07-11 (×3): qty 2

## 2017-07-11 NOTE — Progress Notes (Signed)
  Echocardiogram 2D Echocardiogram has been performed.  Kimberly Larsen T [name] 07/11/2017, 11:57 AM

## 2017-07-11 NOTE — Evaluation (Signed)
Physical Therapy Evaluation Patient Details Name: Kimberly Larsen MRN: [id number] DOB: 25-Jun-1945 Today's Date: 07/11/2017   History of Present Illness  72 y.o. female with medical history significant of Chronic Back Pain from Hx of Back Surgery, Hx of Endometrial Cancer, Anxiety, Hx of Nephrolithiasis, HLD, HTN, Hx of Gastroparesis and other comorbids who presented with Lightheadedness, Dizziness, Generalized Weakness and Urinary Urgency. Dx of UTI, hypotension, AKI.  Clinical Impression  Pt is independent with mobility. She ambulated 400' without an assistive device, no loss of balance. She is ready to DC home from PT standpoint. No further PT indicated, will sign off. Encouraged pt to ambulate in halls 2-3x/day to minimize deconditioning.     Follow Up Recommendations No PT follow up    Equipment Recommendations  None recommended by PT    Recommendations for Other Services       Precautions / Restrictions Precautions Precautions: None Precaution Comments: pt denies h/o falls in past 1 year Restrictions Weight Bearing Restrictions: No      Mobility  Bed Mobility Overal bed mobility: Independent                Transfers Overall transfer level: Independent                  Ambulation/Gait Ambulation/Gait assistance: Independent Ambulation Distance (Feet): 400 Feet Assistive device: None Gait Pattern/deviations: WFL(Within Functional Limits)     General Gait Details: steady, no loss of balance  Stairs            Wheelchair Mobility    Modified Rankin (Stroke Patients Only)       Balance Overall balance assessment: Independent                                           Pertinent Vitals/Pain Pain Assessment: 0-10 Pain Score: 3  Pain Location: chronic LBP Pain Descriptors / Indicators: Sore Pain Intervention(s): Limited activity within patient's tolerance;Monitored during session    Home Living Family/patient expects to be  discharged to:: Private residence Living Arrangements: Alone Available Help at Discharge: Family;Neighbor;Available PRN/intermittently Type of Home: Apartment Home Access: Stairs to enter   Entrance Stairs-Number of Steps: 1 Home Layout: One level Home Equipment: Walker - 2 wheels;Cane - single point;Bedside commode;Shower seat;Grab bars - tub/shower      Prior Function Level of Independence: Independent               Hand Dominance   Dominant Hand: Right    Extremity/Trunk Assessment   Upper Extremity Assessment Upper Extremity Assessment: Overall WFL for tasks assessed    Lower Extremity Assessment Lower Extremity Assessment: Overall WFL for tasks assessed    Cervical / Trunk Assessment Cervical / Trunk Assessment: Normal  Communication   Communication: No difficulties  Cognition Arousal/Alertness: Awake/alert Behavior During Therapy: WFL for tasks assessed/performed Overall Cognitive Status: Within Functional Limits for tasks assessed                                        General Comments      Exercises     Assessment/Plan    PT Assessment Patent does not need any further PT services  PT Problem List         PT Treatment Interventions      PT Goals (  Current goals can be found in the Care Plan section)  Acute Rehab PT Goals PT Goal Formulation: All assessment and education complete, DC therapy    Frequency     Barriers to discharge        Co-evaluation               AM-PAC PT "6 Clicks" Daily Activity  Outcome Measure Difficulty turning over in bed (including adjusting bedclothes, sheets and blankets)?: None Difficulty moving from lying on back to sitting on the side of the bed? : None Difficulty sitting down on and standing up from a chair with arms (e.g., wheelchair, bedside commode, etc,.)?: None Help needed moving to and from a bed to chair (including a wheelchair)?: None Help needed walking in hospital room?:  None Help needed climbing 3-5 steps with a railing? : A Little 6 Click Score: 23    End of Session Equipment Utilized During Treatment: Gait belt Activity Tolerance: Patient tolerated treatment well Patient left: in chair;with call bell/phone within reach Nurse Communication: Mobility status      Time: 7517-0017 PT Time Calculation (min) (ACUTE ONLY): 23 min   Charges:   PT Evaluation $PT Eval Low Complexity: 1 Low PT Treatments $Gait Training: 8-22 mins   PT G Codes:          Kimberly Larsen 07/11/2017, 10:10 AM [phone]

## 2017-07-11 NOTE — Progress Notes (Signed)
Pharmacy Antibiotic Note  Kimberly Larsen is a 72 y.o. female admitted on 07/10/2017 with UTI.  Pharmacy has been consulted for cefepime dosing.  Plan:  With improvement in renal function, will adjust dose to cefepime 2 gr IV q12h  Monitor clinical course, renal function, cultures as available    Height: 5\' 1"  (154.9 cm) Weight: 193 lb 4.8 oz (87.7 kg) IBW/kg (Calculated) : 47.8  Temp (24hrs), Avg:98.8 F (37.1 C), Min:97.7 F (36.5 C), Max:99.9 F (37.7 C)  Recent Labs  Lab 07/10/17 1101 07/10/17 1109 07/10/17 1315 07/11/17 0413  WBC 17.3*  --   --  8.5  CREATININE 1.25*  --   --  0.86  LATICACIDVEN  --  2.12* 0.65  --     Estimated Creatinine Clearance: 62.2 mL/min (by C-G formula based on SCr of 0.86 mg/dL).    Allergies  Allergen Reactions  . Augmentin [Amoxicillin-Pot Clavulanate] Nausea Only  . Wellbutrin [Bupropion] Nausea Only     Thank you for allowing pharmacy to be a part of this patient's care.  Adrian Saran, PharmD, BCPS Pager [phone] 07/11/2017 7:24 AM

## 2017-07-11 NOTE — Progress Notes (Signed)
Patient has an unsafe Neuro stimulator according to the card she has. SHe cannot have an MRI / SHe has model [id number] implanted 02/23/2014  [employer],  bhj

## 2017-07-11 NOTE — Progress Notes (Signed)
OT Cancellation Note  Patient Details Name: MAE DENUNZIO MRN: [id number] DOB: 06-16-1945   Cancelled Treatment:    Reason Eval/Treat Not Completed: PT screened, no needs identified, will sign off  Akaisha Truman 07/11/2017, 10:17 AM  Lesle Chris, OTR/L [phone] 07/11/2017

## 2017-07-11 NOTE — Progress Notes (Addendum)
PROGRESS NOTE    Kimberly Larsen  HCW:[id number] DOB: [birth year] DOA: 07/10/2017 PCP: Ernestene Kiel, MD   Brief Narrative:  Kimberly Larsen is a 72 y.o. female with medical history significant of Chronic Back Pain from Hx of Back Surgery, Hx of Endometrial Cancer, Anxiety, Hx of Nephrolithiasis, HLD, HTN, Hx of Gastroparesis and other comorbids who presented with Lightheadedness, Dizziness, Generalized Weakness and Urinary Urgency. Patient states she woke up this AM and it was hard for her to get out of bed but when she did she felt wobbly. She went back to bed and woke up again with dizziness and lightheadedness along with blurred vision. She recently saw her PCP for Urinary Urgency and was diagnosed with UTI and started Macrobid but only took one dose. She has not been eating or drinking as well the last few days and states she has had some nausea but no vomiting. She presented to the ED for evaluation and was found to be hypotensive. She was given 2 liters of IVF with improvement and TR[facility] was called to admit for UTI.  Head CT Done for dizziness revealed Mass arising from the Sella extending to the Left and Superiorly. Concern for Pituitary Macroadenoma but unclear what lesion it is. Have ordered an MRI if possible (Given Spinal Cord Stimulator) and have asked Dr. Mickeal Skinner of Neuro-Oncology to review CT Study. Will also get Neurosurgery opinion and I spoke to Dr. Saintclair Halsted who recommended also getting MRA Angio Head.    Assessment & Plan:   Active Problems:   Fibromyalgia   Chronic pain   AKI (acute kidney injury) ([place])   Hyperlipidemia   UTI (urinary tract infection)   Near syncope   Lactic acidosis  Near Syncopal Episode with Dizziness and Transient Blurred Vision -In the Setting of Hypotension likely from Infection but ? 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 Other Lesions including Glioma, Craniopharyngioma, and possible Metastasis -CT Head Showed Mass arising from the sella extending to the left and superiorly. The most likely differential consideration for this mass is a pituitary macroadenoma. Other lesions including glioma, craniopharyngioma, and metastasis are possibilities for this lesion. Note that there is no appreciable calcification in this mass. This lesion measures approximately 2.2 x 2.8 x 1.7 cm. There was also 8 x 8 x 6 mm focus of decreased attenuation in the anterior third ventricle. 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 No appreciable hydrocephalus. Mild periventricular small vessel disease. No acute hemorrhage or acute infarct. There are foci of arterial vascular calcification. Mucosal thickening in several ethmoid air cells. -Will order MRI with/without Contrast of Brain with Pituitary Thin Cuts if possible (Has spinal Cord Stimulator and will need to find out if compatible) -Have asked Dr. Mickeal Skinner of Neuro-Oncology to review Films -Will get Neurosurgery Input to review films as well and weigh in  -Dr. Saintclair Halsted of Neurosurgery  recommending getting MRA Angio of Head as well   Acute Enterococcus Faecalis Urinary Tract Infection -Patient's WBC was 17.3 on Admission and improved to 8.5 -Recently Saw PCP and was placed on Macrobid -Urinalysis looked dirty with Cloudy Urine Appearance, Small Hgb, Large Leukocytes, Many Bacteria, TNTC WBC -Urine Cx showed  >100,000 CFU and Blood Cx never obtained by ED so have reordered  -Given IV Zosyn ion ED and will change to IV Cefepime for now until Sensitivities Result   Lactic Acidosis -Improved -C/w IVF Rehydration as above   Mild AKI, improved -In the setting of Dehydration and poor po intake -Given IVF with NS at 100 mL/hr x 20 hours and is to end tonight  -BUN/Cr went from 23/1.25 -> 21/0.86 -Repeat CMP in AM   Generalized Weakness -PT/OT Evaluate and Treat and recommend no Follow up  HLD -C/w Pravastatin 40 mg po qHS and Omega 3 Fatty Acids  HTN -Taken off of Most of Her Antihypertensives from PCP  -C/w Metoprolol Succinate 25 mg po Daily  -BP remaining on the lower side   Chronic Back Pain -C/w Oxycodone-Acetaminophen 10-325 mg TIDprn, Tramadol 50 mg po q6hprn, Gabapentin 600 mg TID, and Duloxetine 60 mg po Daily   Depression and Anxiety -C/w Alprazolam 0.25 mg po TIDprn, Duloxetine, 60 mg po Daily, and Trazodone 100 mg po qHS  DVT prophylaxis: Enoxaparin 40 mg sq q24h Code Status: FULL CODE Family Communication: No family present at bedside  Disposition Plan: Remain Inpatient for further workup and treatment  Consultants:   Discussed Case with Neuro-Oncology Dr. Mickeal Skinner  Discussed Case with NeuroSurgery Dr. Saintclair Halsted   Procedures:  ECHOCARDIOGRAM ordered and pending read  Antimicrobials:  Anti-infectives (From admission, onward)   Start     Dose/Rate Route Frequency Ordered Stop   07/11/17 0800  ceFEPIme (MAXIPIME) 2 g in sodium chloride 0.9 % 100 mL IVPB     2 g 200 mL/hr over 30 Minutes Intravenous Every 12 hours 07/11/17 0725     07/10/17 2000  ceFEPIme (MAXIPIME) 2 g in sodium chloride 0.9 % 100 mL IVPB  Status:  Discontinued     2 g 200 mL/hr over 30 Minutes Intravenous Every 24 hours 07/10/17 1810 07/11/17 0726   07/10/17 1145  piperacillin-tazobactam (ZOSYN) IVPB 3.375 g     3.375 g 100 mL/hr over 30 Minutes Intravenous  Once 07/10/17 1132 07/10/17 1243       Subjective: Patient seen and examined at bedside and feels improved. No nausea or vomiting. Dizziness has resolved. Has occasional blurred vision in Left Eye. No CP or SOB.   Objective: Vitals:   07/11/17 0500 07/11/17 1008 07/11/17 1100 07/11/17 1311  BP: (!) 121/54   134/90  Pulse: 81   73  Resp: 18   18  Temp: 97.7 F (36.5 C)   98.4 F (36.9 C)  TempSrc: Oral   Oral  SpO2: 97% 94% 95% 94%  Weight: 87.7 kg (193 lb 4.8 oz)     Height:        Intake/Output Summary (Last 24 hours) at 07/11/2017 1323 Last data filed at 07/11/2017 1101 Gross per 24 hour  Intake 1141.67 ml  Output 1350 ml  Net -208.33 ml   Filed Weights   07/10/17 2235 07/11/17 0500  Weight: 87.3 kg (192 lb 8 oz) 87.7 kg (193 lb 4.8 oz)   Examination: Physical Exam:  Constitutional: WN/WD Caucasian female in NAD and appears calm and comfortable Eyes: Lids and conjunctivae normal, sclerae anicteric  ENMT:  External Ears, Nose appear normal. Grossly normal hearing. Mucous membranes are moist.  Neck: Appears normal, supple, no cervical masses, normal ROM, no appreciable thyromegaly, no JVD Respiratory: Clear to auscultation bilaterally, no wheezing, rales, rhonchi or crackles. Normal respiratory effort and patient is not tachypenic. No accessory muscle use.  Cardiovascular: RRR, no murmurs / rubs / gallops. S1 and S2 auscultated. No extremity edema. Abdomen: Soft, non-tender, non-distended. No masses palpated. No appreciable hepatosplenomegaly. Bowel sounds positive x4.  GU: Deferred. Musculoskeletal: No clubbing / cyanosis of digits/nails. No joint deformity upper and lower extremities.  Skin: No rashes, lesions, ulcers. No induration; Warm and dry.  Neurologic: CN 2-12 grossly intact with no focal deficits. Sensation intact in all 4 Extremities. Romberg sign and cerebellar reflexes not assessed.  Psychiatric: Normal judgment and insight. Alert and oriented x 3. Normal mood and appropriate affect.   Data  Reviewed: I have personally reviewed following labs and imaging studies  CBC: Recent Labs  Lab 07/10/17 1101 07/11/17 0413  WBC 17.3* 8.5  NEUTROABS 16.3* 6.9  HGB 12.9 10.6*  HCT 39.6 33.5*  MCV 97.1 99.1  PLT 228 154   Basic Metabolic Panel: Recent Labs  Lab 07/10/17 1101 07/11/17 0413  NA 138 141  K 4.2 4.0  CL 99* 107  CO2 27 27  GLUCOSE 121* 96  BUN 23* 21*  CREATININE 1.25* 0.86  CALCIUM 9.4 8.3*  MG  --  2.0  PHOS  --  2.9   GFR: Estimated Creatinine Clearance: 62.2 mL/min (by C-G formula based on SCr of 0.86 mg/dL). Liver Function Tests: Recent Labs  Lab 07/10/17 1104 07/11/17 0413  AST 27 17  ALT 18 15  ALKPHOS 67 53  BILITOT 0.8 0.6  PROT 6.1* 5.1*  ALBUMIN 3.4* 2.7*   No results for input(s): LIPASE, AMYLASE in the last 168 hours. No results for input(s): AMMONIA in the last 168 hours. Coagulation Profile: No results for input(s): INR, PROTIME in the last 168 hours. Cardiac Enzymes: Recent Labs  Lab 07/10/17 2259 07/11/17 0413 07/11/17 1037  TROPONINI <0.03 <0.03 <0.03   BNP (last 3 results) No results for input(s): PROBNP in the last 8760 hours. HbA1C: No results for input(s): HGBA1C in the last 72 hours. CBG: Recent Labs  Lab 07/10/17 1057  GLUCAP 111*   Lipid Profile: No results for input(s): CHOL, HDL, LDLCALC, TRIG, CHOLHDL, LDLDIRECT in the last 72 hours. Thyroid Function Tests: Recent Labs    07/10/17 1106  TSH 0.597   Anemia Panel: No results for input(s): VITAMINB12, FOLATE, FERRITIN, TIBC, IRON, RETICCTPCT in the last 72 hours. Sepsis Labs: Recent Labs  Lab 07/10/17 1109 07/10/17 1315  LATICACIDVEN 2.12* 0.65    Recent Results (from the past 240 hour(s))  Urine culture     Status: Abnormal (Preliminary result)   Collection Time: 07/10/17 12:02 PM  Result Value Ref Range Status   Specimen Description   Final    URINE, CLEAN CATCH Performed at [facility] [address] [place],  [place] [address]    Special Requests   Final    NONE Performed at [facility], [place] [address] [place], [place] [address]    Culture (A)  Final    >=100,000 COLONIES/mL ENTEROCOCCUS FAECALIS SUSCEPTIBILITIES TO FOLLOW Performed at [facility], [place] [address], [place], [place] [address]    Report Status PENDING  Incomplete     Radiology Studies: Ct Head Wo Contrast  Result Date: 07/10/2017 CLINICAL DATA:  Dizziness with blurred vision EXAM: CT HEAD WITHOUT CONTRAST TECHNIQUE: Contiguous axial images were obtained from the base of the skull through the vertex without intravenous contrast. COMPARISON:  None. FINDINGS: Brain: The ventricles appear normal in size and configuration. There is a focal mass at the anterior third ventricular level showing increased attenuation. This mass measures 0.8 x 0.8 x 0.6 cm. 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 There is a mass arising from the pituitary. This mass extends into the suprasellar region to the level of the optic chiasm. This mass extends leftward and invades the cavernous sinus on the left. There is bony remodeling on the left in this area. This mass measures 2.2 cm from superior to inferior dimension, 2.8 cm from right to left dimension, and 1.7 cm from anterior to posterior dimension. Posteriorly, this mass causes a degree of clival remodeling. No other masses are evident. There is no hemorrhage. No midline shift or extra-axial fluid collection. There is slight small vessel disease in the centra semiovale bilaterally. No acute infarct is evident. Vascular: No hyperdense vessel. There is calcification in each carotid siphon. Skull: Bony calvarium appears intact except for the changes in the perisellar region on the left and clivus due to the pituitary mass. Sinuses/Orbits: There is mucosal thickening in several ethmoid air cells. Other visualized paranasal sinuses are clear. Orbits appear symmetric  bilaterally. Other: Mastoid air cells are clear. IMPRESSION: 1. Mass arising from the sella extending to the left and superiorly. The most likely differential consideration for this mass is a pituitary macroadenoma. Other lesions including glioma, craniopharyngioma, and metastasis are possibilities for this lesion. Note that there is no appreciable calcification in this mass. This lesion measures approximately 2.2 x 2.8 x 1.7 cm. 2. 8 x 8 x 6 mm focus of decreased attenuation in the anterior third ventricle. 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 3.  No appreciable hydrocephalus. 4. Mild periventricular small vessel disease. No acute hemorrhage or acute infarct. 5.  There are foci of arterial vascular calcification. 6.  Mucosal thickening in several ethmoid air cells. Comment: Nonemergent MR to evaluate the pituitary and third ventricular region masses pre and post-contrast advised. Electronically Signed   By: Lowella Grip III M.D.   On: 07/10/2017 19:02   Ct Abdomen Pelvis W Contrast  Result Date: 07/10/2017 CLINICAL DATA:  Diagnosed with urinary tract infection 2 days ago. EXAM: CT ABDOMEN AND PELVIS WITH CONTRAST TECHNIQUE: Multidetector CT imaging of the abdomen and pelvis was performed using the standard protocol following bolus administration of intravenous contrast. CONTRAST:  49mL ISOVUE-300 IOPAMIDOL (ISOVUE-300) INJECTION 61% COMPARISON:  May 16, 2017 FINDINGS: Lower chest: Mild atelectasis of the posterior lung bases are noted. Hepatobiliary: No focal lesion is identified within the liver. The gallbladder is normal. There is mild intra and extrahepatic biliary ductal dilatation. The common bowel duct measures 11 mm. Pancreas: Unremarkable. No pancreatic ductal dilatation or surrounding inflammatory changes. Spleen: Normal in size without focal abnormality. Adrenals/Urinary Tract: The adrenal glands are normal. There are small nonobstructing stones in both  kidneys. Bilateral parapelvic renal cysts are identified. There is no hydronephrosis bilaterally. Cortical cysts are identified in the left kidney. The bladder is normal. Stomach/Bowel: Stomach is within normal limits. The appendix is not seen. No inflammation is noted around the cecum. No evidence of bowel wall thickening, distention, or inflammatory changes. There is diverticulosis of colon without diverticulitis. Vascular/Lymphatic: Aortic atherosclerosis. No enlarged abdominal or pelvic  lymph nodes. Reproductive: Status post hysterectomy. No adnexal masses. Other: Small bilateral inguinal herniation of mesenteric fat is identified. There is small umbilical herniation of mesenteric fat. Musculoskeletal: Degenerative and postop changes of the lumbar spine are noted. IMPRESSION: No acute abnormality identified in the abdomen and pelvis. Nonobstructing stones in bilateral kidneys. No hydronephrosis is noted bilaterally. Nonspecific mild dilatation of intra and extrahepatic biliary ducts. Electronically Signed   By: Abelardo Diesel M.D.   On: 07/10/2017 14:21   Dg Chest Portable 1 View  Result Date: 07/10/2017 CLINICAL DATA:  Hypotension EXAM: PORTABLE CHEST 1 VIEW COMPARISON:  May 15, 2017 FINDINGS: The heart size and mediastinal contours are stable. There is no focal infiltrate, pulmonary edema, or pleural effusion. The visualized skeletal structures are unremarkable. IMPRESSION: No active cardiopulmonary disease. Electronically Signed   By: Abelardo Diesel M.D.   On: 07/10/2017 11:24   Scheduled Meds: . aspirin EC  81 mg Oral Daily  . docusate sodium  100 mg Oral BID  . DULoxetine  60 mg Oral Daily  . enoxaparin (LOVENOX) injection  40 mg Subcutaneous QHS  . fluticasone  1 spray Each Nare Daily  . gabapentin  600 mg Oral TID  . Melatonin  10 mg Oral QHS  . metoprolol succinate  25 mg Oral Daily  . multivitamin with minerals  1 tablet Oral Daily  . omega-3 acid ethyl esters  1 g Oral BID  .  pravastatin  40 mg Oral QHS  . traZODone  100 mg Oral QHS   Continuous Infusions: . sodium chloride 100 mL/hr at 07/11/17 0821  . ceFEPime (MAXIPIME) IV Stopped (07/11/17 0851)    LOS: 1 day   Kerney Elbe, DO [facility] Pager [phone]  If 7PM-7AM, please contact night-coverage [email or web address] Password [email or web address] 07/11/2017, 1:23 PM

## 2017-07-12 ENCOUNTER — Inpatient Hospital Stay (HOSPITAL_COMMUNITY): Payer: Medicare Other

## 2017-07-12 DIAGNOSIS — D497 Neoplasm of unspecified behavior of endocrine glands and other parts of nervous system: Secondary | ICD-10-CM

## 2017-07-12 LAB — COMPREHENSIVE METABOLIC PANEL
ALT: 18 U/L (ref 14–54)
AST: 18 U/L (ref 15–41)
Albumin: 3.4 g/dL — ABNORMAL LOW (ref 3.5–5.0)
Alkaline Phosphatase: 69 U/L (ref 38–126)
Anion gap: 7 (ref 5–15)
BUN: 15 mg/dL (ref 6–20)
CO2: 31 mmol/L (ref 22–32)
Calcium: 9.4 mg/dL (ref 8.9–10.3)
Chloride: 108 mmol/L (ref 101–111)
Creatinine, Ser: 0.7 mg/dL (ref 0.44–1.00)
GFR calc Af Amer: >60 mL/min (ref >60)
GFR calc non Af Amer: >60 mL/min (ref >60)
Glucose, Bld: 97 mg/dL (ref 65–99)
Potassium: 4.1 mmol/L (ref 3.5–5.1)
Sodium: 146 mmol/L — ABNORMAL HIGH (ref 135–145)
Total Bilirubin: 0.6 mg/dL (ref 0.3–1.2)
Total Protein: 6.6 g/dL (ref 6.5–8.1)

## 2017-07-12 LAB — CBC WITH DIFFERENTIAL/PLATELET
Basophils Absolute: 0 10*3/uL (ref 0.0–0.1)
Basophils Relative: 0 %
Eosinophils Absolute: 0.6 10*3/uL (ref 0.0–0.7)
Eosinophils Relative: 6 %
HCT: 40.5 % (ref 36.0–46.0)
Hemoglobin: 12.5 g/dL (ref 12.0–15.0)
Lymphocytes Relative: 18 %
Lymphs Abs: 1.5 10*3/uL (ref 0.7–4.0)
MCH: 30.1 pg (ref 26.0–34.0)
MCHC: 30.9 g/dL (ref 30.0–36.0)
MCV: 97.6 fL (ref 78.0–100.0)
Monocytes Absolute: 0.5 10*3/uL (ref 0.1–1.0)
Monocytes Relative: 6 %
Neutro Abs: 6.1 10*3/uL (ref 1.7–7.7)
Neutrophils Relative %: 70 %
Platelets: 227 10*3/uL (ref 150–400)
RBC: 4.15 MIL/uL (ref 3.87–5.11)
RDW: 14 % (ref 11.5–15.5)
WBC: 8.7 10*3/uL (ref 4.0–10.5)

## 2017-07-12 LAB — URINE CULTURE: Culture: >=100000 — AB

## 2017-07-12 LAB — PHOSPHORUS: Phosphorus: 2.6 mg/dL (ref 2.5–4.6)

## 2017-07-12 LAB — T4, FREE: Free T4: 0.6 ng/dL — ABNORMAL LOW (ref 0.61–1.12)

## 2017-07-12 LAB — MAGNESIUM: Magnesium: 2 mg/dL (ref 1.7–2.4)

## 2017-07-12 MED ORDER — SENNOSIDES-DOCUSATE SODIUM 8.6-50 MG PO TABS
1.0000 | ORAL_TABLET | Freq: Two times a day (BID) | ORAL | Status: DC
  Administered 2017-07-12: 1 via ORAL
  Filled 2017-07-12: qty 1

## 2017-07-12 MED ORDER — BISACODYL 10 MG RE SUPP: 10.0000 mg | Freq: Every day | RECTAL | Status: DC | PRN

## 2017-07-12 MED ORDER — POLYETHYLENE GLYCOL 3350 17 G PO PACK
17.0000 g | PACK | Freq: Two times a day (BID) | ORAL | Status: DC
  Administered 2017-07-12: 17 g via ORAL
  Filled 2017-07-12: qty 1

## 2017-07-12 MED ORDER — AMOXICILLIN 250 MG PO CAPS
500.0000 mg | ORAL_CAPSULE | Freq: Three times a day (TID) | ORAL | Status: DC
  Administered 2017-07-12: 500 mg via ORAL
  Filled 2017-07-12: qty 2

## 2017-07-12 MED ORDER — IOPAMIDOL (ISOVUE-370) INJECTION 76%
INTRAVENOUS | Status: AC
  Filled 2017-07-12: qty 100

## 2017-07-12 MED ORDER — IOPAMIDOL (ISOVUE-300) INJECTION 61%
INTRAVENOUS | Status: AC
  Filled 2017-07-12: qty 100

## 2017-07-12 MED ORDER — IOPAMIDOL (ISOVUE-370) INJECTION 76%
INTRAVENOUS | Status: AC
  Administered 2017-07-12: 150 mL
  Filled 2017-07-12: qty 100

## 2017-07-12 MED ORDER — AMOXICILLIN 500 MG PO CAPS: 500.0000 mg | ORAL_CAPSULE | Freq: Three times a day (TID) | ORAL | 0 refills | Status: AC

## 2017-07-12 MED ORDER — POLYETHYLENE GLYCOL 3350 17 G PO PACK: 17.0000 g | PACK | Freq: Two times a day (BID) | ORAL | 0 refills | Status: DC

## 2017-07-13 LAB — INSULIN-LIKE GROWTH FACTOR: Somatomedin C: 108 ng/mL (ref 38–163)

## 2017-07-14 LAB — LUTEINIZING HORMONE: LH: <0.2 m[IU]/mL

## 2017-07-14 LAB — FOLLICLE STIMULATING HORMONE: FSH: 0.7 m[IU]/mL

## 2017-07-14 LAB — ACTH: C206 ACTH: 15.4 pg/mL (ref 7.2–63.3)

## 2017-07-14 LAB — T3, FREE: T3, Free: 1.4 pg/mL — ABNORMAL LOW (ref 2.0–4.4)

## 2017-07-14 LAB — PROLACTIN: Prolactin: 4548 ng/mL — ABNORMAL HIGH (ref 4.8–23.3)

## 2017-07-16 DIAGNOSIS — D352 Benign neoplasm of pituitary gland: Secondary | ICD-10-CM | POA: Insufficient documentation

## 2017-07-16 DIAGNOSIS — L600 Ingrowing nail: Secondary | ICD-10-CM | POA: Diagnosis not present

## 2017-07-16 DIAGNOSIS — M79672 Pain in left foot: Secondary | ICD-10-CM | POA: Diagnosis not present

## 2017-07-16 HISTORY — DX: Benign neoplasm of pituitary gland: D35.2

## 2017-07-16 LAB — CULTURE, BLOOD (ROUTINE X 2)
Culture: NO GROWTH
Culture: NO GROWTH
Special Requests: ADEQUATE
Special Requests: ADEQUATE

## 2017-07-16 NOTE — Discharge Summary (Signed)
Physician Discharge Summary  Kimberly Larsen GYI:[id number] DOB: 11-Oct-1945 DOA: 07/10/2017  PCP: Ernestene Kiel, MD  Admit date: 07/10/2017 Discharge date: 07/12/2017  Admitted From: Home Disposition: Home  Recommendations for Outpatient Follow-up:  1. Follow up with PCP in 1-2 weeks 2. Follow up with Dr. Minette Brine in Endocrinology as an outpatient in 1-2 weeks  3. Follow up with Neurosurgery Dr. Saintclair Halsted as an outpatient 4. Follow up with Opthalmology as an outpatient for Visual Fields Examination 5. Follow up with Neuro-Oncology Dr. Mickeal Skinner in 1-2 weeks 6. Please obtain CMP/CBC, Mag, Phos in one week 7. Please follow up on the following pending results: [place]: No  Equipment/Devices: None recommended by PT/OT  Discharge Condition: Stable  CODE STATUS: FULL CODE Diet recommendation: Heart Healthy Diet  Brief/Interim Summary: Jackeline Gutknecht Smithis a 72 y.o.femalewith medical history significant ofChronic Back Pain from Hx of Back Surgery, Hx of Endometrial Cancer, Anxiety, Hx of Nephrolithiasis, HLD, HTN, Hx of Gastroparesis and other comorbids who presented with Lightheadedness, Dizziness, Generalized Weakness and Urinary Urgency. Patient states she woke up this AM and it was hard for her to get out of bed but when she did she felt wobbly. She went back to bed and woke up again with dizziness and lightheadedness along with blurred vision. She recently saw her PCP for Urinary Urgency and was diagnosed with UTI and started Macrobid but only took one dose. She has not been eating or drinking as well the last few days and states she has had some nausea but no vomiting. She presented to the ED for evaluation and was found to be hypotensive. She was given 2 liters of IVF with improvement and [facility] was called to admit for UTI.  Head CT Done for dizziness revealed Mass arising from the Sella extending to the Left and Superiorly. Concern for Pituitary Macroadenoma but  unclear what lesion it is. Ordered an MRI if possible (Given Spinal Cord Stimulator) and have asked Dr. Mickeal Skinner of Neuro-Oncology to review CT Study. Also got Neurosurgery's opinion and I spoke to Dr. Saintclair Halsted who recommended also getting MRA Angio Head. Unfortunately patient was unable to under go MRI and MRA because of Spinal Cord Stimulator so a Maxillofacial CT w/wo Contrast and a CTA of the Brain was done which revealed Pituitary macroadenoma measures 35 x 18 x 24 mm and extends into the suprasellar cistern as well as to the LEFT cavernous sinus. There is regional osseous remodeling/erosion. The tumor does exert mild mass effect on the medial LEFT temporal lobe.Displacement of the LEFT internal carotid artery laterally in its cavernous segment, as well as elevation of both anterior cerebral arteries in the midline. No significant vascular compromise is observed.  A Hormone Panel Level was checked because of the Pitutary adenoma and case was discussed with Endocrinology Dr. Buddy Duty who will see the patient as an outpatient in consultation. Dr. Saintclair Halsted reviewed new Imaging studies and recommended obtaining an Opthalmology consultation as well. Her Abx were changed to Amoxicillin 500 mg q8h to cover her E. Faecalis UTI and PT Evaluated her. She was deemed medically stable to D/C Home and will need to follow up with PCP, Neurosurgery, Neuro Oncology, Endocrinology, and Opthalmology at Discharge.   Discharge Diagnoses:  Active Problems:   Fibromyalgia   Chronic pain   AKI (acute kidney injury) ([place])   Hyperlipidemia   UTI (urinary tract infection)   Near syncope   Lactic acidosis  Near Syncopal Episodewith Dizziness and Transient Blurred  Vision, improved  -In the Setting of Hypotension likely from Infection but likely  Now Mass in [place] Blood Cx showed NGTD and Urine Cx grew >100,000 of E Faecalis;  -IVF Rehydration with NS at 100 mL/hr -Checked ECHOCardiogram as below  -Troponin I x3 Negative,   -Head CT w/o Contrast as below -TSH was 0.597 -Checked Orthostatic Vital Signs and was not Orthostati -PT/OT Evaluation and Treatment recommend no follow up -Blurred Vision and Dizziness improved however patient admits to a visual field cut that has been going on for a while now in her Left Eye; Lateral portion of Left Eye blurry to patient occasionally  -On Telemetry while hospitalized -Patient to follow up with Ophthalmology, PCP and Neuro-Oncology at D/C  Suprasellar Pituitary Mass concern for Pituitary Macroadenoma vs. Other Lesions including Glioma, Craniopharyngioma, and possible Metastasis; Suspect Likley Pituitary Macroadenoma -CT Head Showed Mass arising from the sella extending to the left and superiorly. The most likely differential consideration for this mass is a pituitary macroadenoma. Other lesions including glioma, craniopharyngioma, and metastasis are possibilities for this lesion. Note that there is no appreciable calcification in this mass. This lesion measures approximately 2.2 x 2.8 x 1.7 cm. There was also 8 x 8 x 6 mm focus of decreased attenuation in the anterior third ventricle. 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 No appreciable hydrocephalus. Mild periventricular small vessel disease. No acute hemorrhage or acute infarct. There are foci of arterial vascular calcification.Mucosal thickening in several ethmoid air cells. -Ordered MRI with/without Contrast of Brain with Pituitary Thin Cuts and MRA Angio of Head but was unable to be done as she has aspinal Cord Stimulator that is not compatible -Have asked Dr. Mickeal Skinner of Neuro-Oncology to review Films and follow up as an outpatient  -Dr. Saintclair Halsted of Neurosurgery called and recommended getting Face CT w/wo Contrast and a CTA of the Head  -CTA of the Head showed Pituitary macroadenoma measures 35 x 18 x 24 mm and extends into the suprasellar cistern as well as to the LEFT cavernous sinus. There is  regional osseous remodeling/erosion. The tumor does exert mild mass effect on the medial LEFT temporal lobe. Displacement of the LEFT internal carotid artery laterally in its cavernous segment, as well as elevation of both anterior cerebral arteries in the midline. No significant vascular compromise is observed. No intrinsic stenosis of the intracranial circulation. Minor BILATERAL cavernous ICA calcification. -Face CT showed No facial fracture or soft tissue abnormality is evident. Pituitary macroadenoma with skull base involvement described separately under CTA head. -Obtained TSH, ACTH, FSH, LH, Insulin-Growth Like Factor Levels and pending -Discussed Case with Dr. Buddy Duty in Endocrinology and he will see the patient in Consultation as an outpatient -Will need to see Opthalmology as an outpatient as well and gave patient number to [facility] Ophthalmology  -Patient is to follow up with Neurosurgery, Neuro-Oncology, Endocrinology and Ophthalmology at D/C  Acute Enterococcus Faecalis Urinary Tract Infection -Patient's WBC was 17.3 on Admission and improved to 8.5 -Recently Saw PCP and was placed on Macrobid -Urinalysis looked dirty with Cloudy Urine Appearance, Small Hgb, Large Leukocytes, Many Bacteria, TNTC WBC -Urine Cx showed >100,000 CFU and Blood Cx never obtained by ED so have reordered  -Given IV Zosyn ion ED and will changed to IV Cefepime -Abx where then changed to Amoxicillin 500 mg po q8h given Sensitivities -Follow up with PCP    Lactic Acidosis -Improved -C/w IVF Rehydration as above   Mild AKI, improved -  In the setting of Dehydration and poor po intake -Given IVF with NS at 100 mL/hr x 20 hours and is to end tonight  -BUN/Cr went from 23/1.25 -> 21/0.86 -> 15/0.70 -Repeat CMP in AM   Generalized Weakness -PT/OT Evaluate and Treatand recommend no Follow up  HLD -C/w Pravastatin 40 mg po qHS and Omega 3 Fatty Acids  HTN -Taken off of Most of Her  Antihypertensives from PCP  -C/w Metoprolol Succinate 25 mg po Daily  -BP remaining on the lower side   Chronic Back Pain -C/w Oxycodone-Acetaminophen 10-325 mg TIDprn, Tramadol 50 mg po q6hprn, Gabapentin 600 mg TID, and Duloxetine 60 mg po Daily   Depression and Anxiety -C/w Alprazolam 0.25 mg po TIDprn, Duloxetine, 60 mg po Daily, and Trazodone 100 mg po qHS  Discharge Instructions  Discharge Instructions    Call MD for:  difficulty breathing, headache or visual disturbances   Complete by:  As directed    Call MD for:  extreme fatigue   Complete by:  As directed    Call MD for:  hives   Complete by:  As directed    Call MD for:  persistant dizziness or light-headedness   Complete by:  As directed    Call MD for:  persistant nausea and vomiting   Complete by:  As directed    Call MD for:  redness, tenderness, or signs of infection (pain, swelling, redness, odor or green/yellow discharge around incision site)   Complete by:  As directed    Call MD for:  severe uncontrolled pain   Complete by:  As directed    Call MD for:  temperature >100.4   Complete by:  As directed    Diet - low sodium heart healthy   Complete by:  As directed    Discharge instructions   Complete by:  As directed    Follow up with PCP, Neuro-Oncology, Endocrinology, Neurosurgery, and Opthalmology at D/C. Take all medications as prescribed. If symptoms change or worsen please return to the ED for evaluation.   Increase activity slowly   Complete by:  As directed      Allergies as of 07/12/2017      Reactions   Augmentin [amoxicillin-pot Clavulanate] Nausea Only   Wellbutrin [bupropion] Nausea Only      Medication List    STOP taking these medications   amLODipine 10 MG tablet Commonly known as:  NORVASC   hydrochlorothiazide 12.5 MG capsule Commonly known as:  MICROZIDE   nitrofurantoin (macrocrystal-monohydrate) 100 MG capsule Commonly known as:  MACROBID   senna 8.6 MG Tabs  tablet Commonly known as:  SENOKOT     TAKE these medications   alendronate 70 MG tablet Commonly known as:  FOSAMAX Take 70 mg by mouth once a week.   ALPRAZolam 0.25 MG tablet Commonly known as:  XANAX Take 0.25 mg by mouth 3 (three) times daily as needed.   amoxicillin 500 MG capsule Commonly known as:  AMOXIL Take 1 capsule (500 mg total) by mouth every 8 (eight) hours for 7 days.   aspirin EC 81 MG tablet Take 81 mg by mouth daily.   B COMPLEX PO Take 1 tablet by mouth daily.   docusate sodium 100 MG capsule Commonly known as:  COLACE Take 1 capsule (100 mg total) by mouth 2 (two) times daily. While taking narcotic pain medicine.   DULoxetine 60 MG capsule Commonly known as:  CYMBALTA Take 60 mg by mouth daily.   Fish Oil  600 MG Caps Take 1 capsule by mouth 2 (two) times daily.   fluticasone 50 MCG/ACT nasal spray Commonly known as:  FLONASE Place 1 spray into both nostrils daily.   gabapentin 600 MG tablet Commonly known as:  NEURONTIN Take 600 mg by mouth 3 (three) times daily.   HAIR SKIN AND NAILS FORMULA PO Take 1 tablet by mouth daily.   Melatonin 10 MG Caps Take 10 mg by mouth at bedtime.   metoprolol succinate 25 MG 24 hr tablet Commonly known as:  TOPROL-XL Take 25 mg by mouth daily.   multivitamin with minerals Tabs tablet Take 1 tablet by mouth daily.   oxyCODONE 10 mg 12 hr tablet Commonly known as:  OXYCONTIN Take 1 tablet (10 mg total) by mouth every 12 (twelve) hours. For no more than 3 days.   oxyCODONE-acetaminophen 10-325 MG tablet Commonly known as:  PERCOCET Take 1 tablet by mouth 3 (three) times daily as needed for pain.   polyethylene glycol packet Commonly known as:  MIRALAX / GLYCOLAX Take 17 g by mouth 2 (two) times daily.   pravastatin 40 MG tablet Commonly known as:  PRAVACHOL Take 1 tablet (40 mg total) by mouth at bedtime.   traZODone 100 MG tablet Commonly known as:  DESYREL Take 100 mg by mouth at bedtime.       Follow-up Information    Ernestene Kiel, MD. Call.   Specialty:  Internal Medicine Why:  Follow up within 1 week  Contact information: [place]. [place] [address] [phone]        Delrae Rend, MD. Call.   Specialty:  Endocrinology Why:  Follow up in 1-2 weeks for Evaluation by Endocrinologist and for results of Lab panel drawn in [place] information: [address] [place] [address] [phone]        Kary Kos, MD. Call.   Specialty:  Neurosurgery Why:  Follow up in 1-2 weeks for evaluation  Contact information: [address] [place] [address] [phone]        Opthamology, [place]. Call.   Specialty:  Ophthalmology Why:  Call for [facility] information: [place] [address] [phone]        Ventura Sellers, MD Follow up.   Specialties:  Psychiatry, Neurology, Oncology Why:  Call for evaluation for [facility] information: [place] [address] [phone]          Allergies  Allergen Reactions  . Augmentin [Amoxicillin-Pot Clavulanate] Nausea Only  . Wellbutrin [Bupropion] Nausea Only   Consultations:  Case Discussed with Neuro-Oncology Dr. Mickeal Skinner  Case Discussed with Dr. Saintclair Halsted of Neurosurgery  Case Discussed with Dr. Buddy Duty of Endocrinology  Procedures/Studies: Ct Angio Head W Or Wo Contrast  Result Date: 07/12/2017 CLINICAL DATA:  Pituitary tumor. EXAM: CT ANGIOGRAPHY HEAD AND NECK TECHNIQUE: Multidetector CT imaging of the head and neck was performed using the standard protocol during bolus administration of intravenous contrast. Multiplanar CT image reconstructions and MIPs were obtained to evaluate the vascular anatomy. Carotid stenosis measurements (when applicable) are obtained utilizing NASCET criteria, using the distal internal carotid diameter as the denominator. CONTRAST:  121mL  ISOVUE-370 IOPAMIDOL (ISOVUE-370) INJECTION 76% COMPARISON:  CT head 07/10/2017. FINDINGS: Please note that the CTA neck was performed inadvertently as a study of the wrong body part. Patient will not be charged for this study, but is interpreted as part of the  CTA head. An additional 50 mL of contrast was required to complete the CTA head study. CTA NECK Aortic arch: Standard branching. Imaged portion shows no evidence of aneurysm or dissection. No significant stenosis of the major arch vessel origins. Right carotid system: Minor atheromatous change the bifurcation. No evidence of dissection, stenosis (50% or greater) or occlusion. Left carotid system: No significant plaque at the bifurcation. No evidence of dissection, stenosis (50% or greater) or occlusion. Vertebral arteries: Codominant. No evidence of dissection, stenosis (50% or greater) or occlusion. Nonvascular soft tissues: Mild cervical spondylosis. 11 mm LEFT thyroid nodule and 6 mm LEFT thyroid nodule, which do not meet consensus for biopsy. No lung apex lesion. CTA HEAD Anterior circulation: There is elevation of both A1 segment anterior cerebral arteries in the midline due to suprasellar extension of a pituitary macroadenoma. There is lateral displacement of the LEFT internal carotid artery due to cavernous sinus extension of the tumor. Overall the homogeneously enhancing macroadenoma measures approximately 35 x 18 x 24 mm (R-L x A-P x C-C). There is osseous remodeling/erosion of the sella and skull base on the LEFT. There is mild mass effect on the medial LEFT temporal lobe. There is nonstenotic atheromatous change both cavernous internal carotid arteries. No stenosis is seen of the anterior or middle cerebral arteries. No saccular aneurysm, or vascular malformation. Posterior circulation: Unremarkable. No significant stenosis, proximal occlusion, aneurysm, or vascular malformation. Venous sinuses: As permitted by contrast timing, patent. Tumor fills  the LEFT cavernous sinus. Anatomic variants: None of significance. Delayed phase: Other than the pituitary, no abnormal intracranial enhancement. Review of the MIP images confirms the above findings IMPRESSION: Pituitary macroadenoma measures 35 x 18 x 24 mm and extends into the suprasellar cistern as well as to the LEFT cavernous sinus. There is regional osseous remodeling/erosion. The tumor does exert mild mass effect on the medial LEFT temporal lobe. Displacement of the LEFT internal carotid artery laterally in its cavernous segment, as well as elevation of both anterior cerebral arteries in the midline. No significant vascular compromise is observed. No intrinsic stenosis of the intracranial circulation. Minor BILATERAL cavernous ICA calcification. Inadvertent imaging of the incorrect body part (CTA neck) revealed minor atheromatous change, the RIGHT carotid bifurcation, and spondylosis. See discussion above. Electronically Signed   By: Staci Righter M.D.   On: 07/12/2017 13:43   Ct Head Wo Contrast  Result Date: 07/10/2017 CLINICAL DATA:  Dizziness with blurred vision EXAM: CT HEAD WITHOUT CONTRAST TECHNIQUE: Contiguous axial images were obtained from the base of the skull through the vertex without intravenous contrast. COMPARISON:  None. FINDINGS: Brain: The ventricles appear normal in size and configuration. There is a focal mass at the anterior third ventricular level showing increased attenuation. This mass measures 0.8 x 0.8 x 0.6 cm. 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 There is a mass arising from the pituitary. This mass extends into the suprasellar region to the level of the optic chiasm. This mass extends leftward and invades the cavernous sinus on the left. There is bony remodeling on the left in this area. This mass measures 2.2 cm from superior to inferior dimension, 2.8 cm from right to left dimension, and 1.7 cm from anterior to posterior dimension.  Posteriorly, this mass causes a degree of clival remodeling. No other masses are evident. There is no hemorrhage. No midline shift or extra-axial fluid collection. There is slight small vessel disease in the centra semiovale bilaterally. No acute infarct is evident.  Vascular: No hyperdense vessel. There is calcification in each carotid siphon. Skull: Bony calvarium appears intact except for the changes in the perisellar region on the left and clivus due to the pituitary mass. Sinuses/Orbits: There is mucosal thickening in several ethmoid air cells. Other visualized paranasal sinuses are clear. Orbits appear symmetric bilaterally. Other: Mastoid air cells are clear. IMPRESSION: 1. Mass arising from the sella extending to the left and superiorly. The most likely differential consideration for this mass is a pituitary macroadenoma. Other lesions including glioma, craniopharyngioma, and metastasis are possibilities for this lesion. Note that there is no appreciable calcification in this mass. This lesion measures approximately 2.2 x 2.8 x 1.7 cm. 2. 8 x 8 x 6 mm focus of decreased attenuation in the anterior third ventricle. 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 3.  No appreciable hydrocephalus. 4. Mild periventricular small vessel disease. No acute hemorrhage or acute infarct. 5.  There are foci of arterial vascular calcification. 6.  Mucosal thickening in several ethmoid air cells. Comment: Nonemergent MR to evaluate the pituitary and third ventricular region masses pre and post-contrast advised. Electronically Signed   By: Lowella Grip III M.D.   On: 07/10/2017 19:02   Ct Abdomen Pelvis W Contrast  Result Date: 07/10/2017 CLINICAL DATA:  Diagnosed with urinary tract infection 2 days ago. EXAM: CT ABDOMEN AND PELVIS WITH CONTRAST TECHNIQUE: Multidetector CT imaging of the abdomen and pelvis was performed using the standard protocol following bolus administration of  intravenous contrast. CONTRAST:  15mL ISOVUE-300 IOPAMIDOL (ISOVUE-300) INJECTION 61% COMPARISON:  May 16, 2017 FINDINGS: Lower chest: Mild atelectasis of the posterior lung bases are noted. Hepatobiliary: No focal lesion is identified within the liver. The gallbladder is normal. There is mild intra and extrahepatic biliary ductal dilatation. The common bowel duct measures 11 mm. Pancreas: Unremarkable. No pancreatic ductal dilatation or surrounding inflammatory changes. Spleen: Normal in size without focal abnormality. Adrenals/Urinary Tract: The adrenal glands are normal. There are small nonobstructing stones in both kidneys. Bilateral parapelvic renal cysts are identified. There is no hydronephrosis bilaterally. Cortical cysts are identified in the left kidney. The bladder is normal. Stomach/Bowel: Stomach is within normal limits. The appendix is not seen. No inflammation is noted around the cecum. No evidence of bowel wall thickening, distention, or inflammatory changes. There is diverticulosis of colon without diverticulitis. Vascular/Lymphatic: Aortic atherosclerosis. No enlarged abdominal or pelvic lymph nodes. Reproductive: Status post hysterectomy. No adnexal masses. Other: Small bilateral inguinal herniation of mesenteric fat is identified. There is small umbilical herniation of mesenteric fat. Musculoskeletal: Degenerative and postop changes of the lumbar spine are noted. IMPRESSION: No acute abnormality identified in the abdomen and pelvis. Nonobstructing stones in bilateral kidneys. No hydronephrosis is noted bilaterally. Nonspecific mild dilatation of intra and extrahepatic biliary ducts. Electronically Signed   By: Abelardo Diesel M.D.   On: 07/10/2017 14:21   Ct Maxillofacial W Contrast  Result Date: 07/12/2017 CLINICAL DATA:  Mass or swelling of the face. EXAM: CT MAXILLOFACIAL WITH CONTRAST TECHNIQUE: Multidetector CT imaging of the maxillofacial structures was performed with intravenous  contrast. Multiplanar CT image reconstructions were also generated. CONTRAST:  137mL ISOVUE-370 IOPAMIDOL (ISOVUE-370) INJECTION 76% COMPARISON:  CTA head and neck.  CT head. FINDINGS: Osseous: There is osseous remodeling/erosion of the sella and skull base on the LEFT related to a pituitary macroadenoma. No facial fracture is seen. Orbits: BILATERAL cataract extraction. No orbital mass. Superior orbital veins are symmetric. No proptosis. Sinuses: Tumor extends into  the unicompartmental sphenoid sinus, but there is no layering fluid. Paranasal sinuses elsewhere clear. Soft tissues: No facial soft tissue swelling is evident. Limited intracranial: Reported separately. IMPRESSION: No facial fracture or soft tissue abnormality is evident. Pituitary macroadenoma with skull base involvement described separately under CTA head. Electronically Signed   By: Staci Righter M.D.   On: 07/12/2017 13:47   Dg Chest Portable 1 View  Result Date: 07/10/2017 CLINICAL DATA:  Hypotension EXAM: PORTABLE CHEST 1 VIEW COMPARISON:  May 15, 2017 FINDINGS: The heart size and mediastinal contours are stable. There is no focal infiltrate, pulmonary edema, or pleural effusion. The visualized skeletal structures are unremarkable. IMPRESSION: No active cardiopulmonary disease. Electronically Signed   By: Abelardo Diesel M.D.   On: 07/10/2017 11:24    ECHOCARDIOGRAM  ------------------------------------------------------------------- Study Conclusions  - Left ventricle: The cavity size was normal. There was mild   concentric hypertrophy. Systolic function was normal. The   estimated ejection fraction was in the range of 55% to 60%. Wall   motion was normal; there were no regional wall motion   abnormalities. Left ventricular diastolic function parameters   were normal. - Mitral valve: Calcified annulus. Mildly thickened leaflets .   There was mild regurgitation. - Right ventricle: The cavity size was normal. Wall thickness  was   normal. Systolic function was normal. - Right atrium: The atrium was normal in size. - Tricuspid valve: There was mild regurgitation. - Pulmonic valve: There was trivial regurgitation. - Pulmonary arteries: Systolic pressure was moderately increased.   PA peak pressure: 46 mm Hg (S). - Inferior vena cava: The vessel was normal in size. - Pericardium, extracardiac: There was no pericardial effusion.  Impressions:  - No prior study available for comparison.  Subjective: Seen and examined and was improved. Still had some blurred vision in Left eye and a field cut but stated it was intermittent. No lightheadedness or dizziness. Felt well and ready to go home.  Discharge Exam: Vitals:   07/12/17 0516 07/12/17 1344  BP: (!) 153/78 (!) 156/89  Pulse: 79 76  Resp: 18 18  Temp: 97.9 F (36.6 C) 97.9 F (36.6 C)  SpO2: 99% 97%   Vitals:   07/11/17 1311 07/11/17 2121 07/12/17 0516 07/12/17 1344  BP: 134/90 (!) 154/65 (!) 153/78 (!) 156/89  Pulse: 73 72 79 76  Resp: 18 20 18 18   Temp: 98.4 F (36.9 C) 99.2 F (37.3 C) 97.9 F (36.6 C) 97.9 F (36.6 C)  TempSrc: Oral Oral Oral Oral  SpO2: 94% 95% 99% 97%  Weight:   85.1 kg (187 lb 9.6 oz)   Height:       General: Pt is alert, awake, not in acute distress Cardiovascular: RRR, S1/S2 +, no rubs, no gallops Respiratory: CTA bilaterally, no wheezing, no rhonchi Abdominal: Soft, NT, ND, bowel sounds + Extremities: no edema, no cyanosis  The results of significant diagnostics from this hospitalization (including imaging, microbiology, ancillary and laboratory) are listed below for reference.    Microbiology: Recent Results (from the past 240 hour(s))  Urine culture     Status: Abnormal   Collection Time: 07/10/17 12:02 PM  Result Value Ref Range Status   Specimen Description   Final    URINE, CLEAN CATCH Performed at [facility], [place] [address], [place], [place] [address]    Special Requests   Final     NONE Performed at [facility], [place] [address], [place], [place] [address]  Culture >=100,000 COLONIES/mL ENTEROCOCCUS FAECALIS (A)  Final   Report Status 07/12/2017 FINAL  Final   Organism ID, Bacteria ENTEROCOCCUS FAECALIS (A)  Final      Susceptibility   Enterococcus faecalis - MIC*    AMPICILLIN <=2 SENSITIVE Sensitive     LEVOFLOXACIN 0.5 SENSITIVE Sensitive     NITROFURANTOIN <=16 SENSITIVE Sensitive     VANCOMYCIN 2 SENSITIVE Sensitive     * >=100,000 COLONIES/mL ENTEROCOCCUS FAECALIS  Culture, blood (routine x 2)     Status: None (Preliminary result)   Collection Time: 07/11/17  1:51 PM  Result Value Ref Range Status   Specimen Description   Final    BLOOD RIGHT HAND Performed at [facility], [place] [address], [place], [place] [address]    Special Requests   Final    BOTTLES DRAWN AEROBIC AND ANAEROBIC Blood Culture adequate volume Performed at [place] [address] [place], [place] [address]    Culture   Final    NO GROWTH 4 DAYS Performed at [facility], [place] [address] [place], [place] [address]    Report Status PENDING  Incomplete  Culture, blood (routine x 2)     Status: None (Preliminary result)   Collection Time: 07/11/17  3:33 PM  Result Value Ref Range Status   Specimen Description   Final    BLOOD RIGHT HAND Performed at [facility], [place] [address] [place], [place] [address]    Special Requests   Final    BOTTLES DRAWN AEROBIC ONLY Blood Culture adequate volume Performed at [place] [address] [place], [place] [address]    Culture   Final    NO GROWTH 4 DAYS Performed at [facility], [place] [address] [place], [place] [address]    Report Status PENDING  Incomplete    Labs: BNP (last 3 results) No results for input(s): BNP in the last 8760 hours. Basic Metabolic Panel: Recent Labs  Lab 07/10/17 1101 07/11/17 0413 07/12/17 0434  NA 138 141  146*  K 4.2 4.0 4.1  CL 99* 107 108  CO2 27 27 31   GLUCOSE 121* 96 97  BUN 23* 21* 15  CREATININE 1.25* 0.86 0.70  CALCIUM 9.4 8.3* 9.4  MG  --  2.0 2.0  PHOS  --  2.9 2.6   Liver Function Tests: Recent Labs  Lab 07/10/17 1104 07/11/17 0413 07/12/17 0434  AST 27 17 18   ALT 18 15 18   ALKPHOS 67 53 69  BILITOT 0.8 0.6 0.6  PROT 6.1* 5.1* 6.6  ALBUMIN 3.4* 2.7* 3.4*   No results for input(s): LIPASE, AMYLASE in the last 168 hours. No results for input(s): AMMONIA in the last 168 hours. CBC: Recent Labs  Lab 07/10/17 1101 07/11/17 0413 07/12/17 0434  WBC 17.3* 8.5 8.7  NEUTROABS 16.3* 6.9 6.1  HGB 12.9 10.6* 12.5  HCT 39.6 33.5* 40.5  MCV 97.1 99.1 97.6  PLT 228 154 227   Cardiac Enzymes: Recent Labs  Lab 07/10/17 2259 07/11/17 0413 07/11/17 1037  TROPONINI <0.03 <0.03 <0.03   BNP: Invalid input(s): POCBNP CBG: Recent Labs  Lab 07/10/17 1057  GLUCAP 111*   D-Dimer No results for input(s): DDIMER in the last 72 hours. Hgb A1c No results for input(s): HGBA1C in the last 72 hours. Lipid Profile No results for input(s): CHOL, HDL, LDLCALC, TRIG, CHOLHDL, LDLDIRECT in the last 72 hours. Thyroid function studies No results for input(s):  TSH, T4TOTAL, T3FREE, THYROIDAB in the last 72 hours.  Invalid input(s): FREET3 Anemia work up No results for input(s): VITAMINB12, FOLATE, FERRITIN, TIBC, IRON, RETICCTPCT in the last 72 hours. Urinalysis    Component Value Date/Time   COLORURINE AMBER (A) 07/10/2017 1202   APPEARANCEUR CLOUDY (A) 07/10/2017 1202   LABSPEC 1.017 07/10/2017 1202   PHURINE 5.0 07/10/2017 1202   GLUCOSEU NEGATIVE 07/10/2017 1202   HGBUR SMALL (A) 07/10/2017 1202   BILIRUBINUR NEGATIVE 07/10/2017 1202   KETONESUR 5 (A) 07/10/2017 1202   PROTEINUR 30 (A) 07/10/2017 1202   UROBILINOGEN 0.2 03/25/2014 1142   NITRITE NEGATIVE 07/10/2017 1202   LEUKOCYTESUR LARGE (A) 07/10/2017 1202   Sepsis Labs Invalid input(s): PROCALCITONIN,  WBC,   LACTICIDVEN Microbiology Recent Results (from the past 240 hour(s))  Urine culture     Status: Abnormal   Collection Time: 07/10/17 12:02 PM  Result Value Ref Range Status   Specimen Description   Final    URINE, CLEAN CATCH Performed at [facility], [place] [address], [place], [place] [address]    Special Requests   Final    NONE Performed at [facility], [place] [address], [place], [place] [address]    Culture >=100,000 COLONIES/mL ENTEROCOCCUS FAECALIS (A)  Final   Report Status 07/12/2017 FINAL  Final   Organism ID, Bacteria ENTEROCOCCUS FAECALIS (A)  Final      Susceptibility   Enterococcus faecalis - MIC*    AMPICILLIN <=2 SENSITIVE Sensitive     LEVOFLOXACIN 0.5 SENSITIVE Sensitive     NITROFURANTOIN <=16 SENSITIVE Sensitive     VANCOMYCIN 2 SENSITIVE Sensitive     * >=100,000 COLONIES/mL ENTEROCOCCUS FAECALIS  Culture, blood (routine x 2)     Status: None (Preliminary result)   Collection Time: 07/11/17  1:51 PM  Result Value Ref Range Status   Specimen Description   Final    BLOOD RIGHT HAND Performed at [facility], [place] [address], [place], [place] [address]    Special Requests   Final    BOTTLES DRAWN AEROBIC AND ANAEROBIC Blood Culture adequate volume Performed at [place] [address], [place], [place] [address]    Culture   Final    NO GROWTH 4 DAYS Performed at [facility], [place] [address] [place], [place] [address]    Report Status PENDING  Incomplete  Culture, blood (routine x 2)     Status: None (Preliminary result)   Collection Time: 07/11/17  3:33 PM  Result Value Ref Range Status   Specimen Description   Final    BLOOD RIGHT HAND Performed at [facility], [place] [address], [place], [place] [address]    Special Requests   Final    BOTTLES DRAWN AEROBIC ONLY Blood Culture adequate volume Performed at [place] [address] [place], [place]  [address]    Culture   Final    NO GROWTH 4 DAYS Performed at [facility], [place] [address] [place], [place] [address]    Report Status PENDING  Incomplete   Time coordinating discharge: 35 minutes  SIGNED:  Kerney Elbe, DO [facility] 07/16/2017, 12:31 AM Pager [phone]  If 7PM-7AM, please contact night-coverage [email or web address] Password [email or web address]

## 2017-07-21 ENCOUNTER — Other Ambulatory Visit: Payer: Self-pay

## 2017-07-21 DIAGNOSIS — R2689 Other abnormalities of gait and mobility: Secondary | ICD-10-CM | POA: Diagnosis not present

## 2017-07-21 DIAGNOSIS — D352 Benign neoplasm of pituitary gland: Secondary | ICD-10-CM | POA: Diagnosis not present

## 2017-07-21 DIAGNOSIS — N390 Urinary tract infection, site not specified: Secondary | ICD-10-CM | POA: Diagnosis not present

## 2017-07-21 DIAGNOSIS — I10 Essential (primary) hypertension: Secondary | ICD-10-CM | POA: Diagnosis not present

## 2017-07-21 NOTE — Patient Outreach (Signed)
[facility]) Care Management  07/21/2017  GLORIANA PILTZ 24-Jan-1946 [id number]   EMMI- General Discharge RED ON EMMI ALERT Day # 4 Date: 07/18/17 Red Alert Reason: Sad/ hopeless/anxious/ empty? Yes  Telephone call to patient for EMMI red alert.  No answer.  Unable to leave a message.  Plan: RN CM will send letter and attempt patient again within 4 business days.   Jone Baseman, RN, MSN [facility] Care Management Care Management Coordinator Direct Line [phone] Toll Free: [phone]  Fax: [phone]

## 2017-07-22 ENCOUNTER — Other Ambulatory Visit: Payer: Self-pay

## 2017-07-22 NOTE — Patient Outreach (Signed)
[place] [facility]) Care Management  07/22/2017  Kimberly Larsen 10-07-1945 [id number]   EMMI- General Discharge RED ON EMMI ALERT Day # 4 Date:07/18/17 Red Alert Reason: Sad/ hopeless/anxious/ empty? Yes  2nd telephone call to patient for EMMI red alert.  No answer.  Unable to leave a message.  Plan: RN CM will wait return call.  If no response will close case.    Jone Baseman, RN, MSN [facility] Care Management Care Management Coordinator Direct Line [phone] Toll Free: [phone]  Fax: [phone]

## 2017-07-23 ENCOUNTER — Telehealth: Payer: Self-pay | Admitting: Internal Medicine

## 2017-07-23 ENCOUNTER — Encounter: Payer: Self-pay | Admitting: Internal Medicine

## 2017-07-23 ENCOUNTER — Inpatient Hospital Stay: Payer: Medicare Other | Attending: Internal Medicine | Admitting: Internal Medicine

## 2017-07-23 DIAGNOSIS — D352 Benign neoplasm of pituitary gland: Secondary | ICD-10-CM | POA: Diagnosis not present

## 2017-07-23 NOTE — Telephone Encounter (Signed)
Appointments scheduled AVS/Calendar printed per 3/27 los °

## 2017-07-23 NOTE — Progress Notes (Signed)
[facility] [place], [place] [address] [phone]   New Patient Evaluation  Date of Service: 07/23/17 Patient Name: Kimberly Larsen Patient MRN: [id number] Patient DOB: 1945-06-14 Provider: Ventura Sellers, MD  Identifying Statement:  Kimberly Larsen is a 72 y.o. 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    Referring Provider: Ernestene Kiel, MD [address] [place], [place] [address]  History of Present Illness: The patient's records from the referring physician were obtained and reviewed and the patient interviewed to confirm this HPI.  [name] presented to medical attention this month with an episode of dizziness accompanied by blurred vision during an episode of low blood pressure.  A CT head was performed during the inpatient workup, which demonstrated a ~3cm sellar mass with extension into the left cavernous sinus, suggestive of pituitary macroadenoma.  She returned to her baseline after IV hydration and rest, was discharged with follow up in this clinic.  Today she has no neurologic complaints, denies headaches or seizures.  She presents to review treatment options moving forward for her brain tumor.  Medications: Current Outpatient Medications on File Prior to Visit  Medication Sig Dispense Refill  . alendronate (FOSAMAX) 70 MG tablet Take 70 mg by mouth once a week.    . ALPRAZolam (XANAX) 0.25 MG tablet Take 0.25 mg by mouth 3 (three) times daily as needed.    Marland Kitchen aspirin EC 81 MG tablet Take 81 mg by mouth daily.    . B Complex Vitamins (B COMPLEX PO) Take 1 tablet by mouth daily.    Marland Kitchen docusate sodium (COLACE) 100 MG capsule Take 1 capsule (100 mg total) by mouth 2 (two) times daily. While taking narcotic pain medicine. 30 capsule 0  . DULoxetine (CYMBALTA) 60 MG capsule Take 60 mg by mouth daily.    . fluticasone (FLONASE) 50 MCG/ACT nasal spray Place 1 spray into both  nostrils daily.    Marland Kitchen gabapentin (NEURONTIN) 600 MG tablet Take 600 mg by mouth 3 (three) times daily.    . Melatonin 10 MG CAPS Take 10 mg by mouth at bedtime.    . metoprolol succinate (TOPROL-XL) 25 MG 24 hr tablet Take 25 mg by mouth daily.   0  . Multiple Vitamin (MULTIVITAMIN WITH MINERALS) TABS Take 1 tablet by mouth daily.    . Multiple Vitamins-Minerals (HAIR SKIN AND NAILS FORMULA PO) Take 1 tablet by mouth daily.    . Omega-3 Fatty Acids (FISH OIL) 600 MG CAPS Take 1 capsule by mouth 2 (two) times daily. 30 capsule   . oxyCODONE (OXYCONTIN) 10 mg 12 hr tablet Take 1 tablet (10 mg total) by mouth every 12 (twelve) hours. For no more than 3 days. (Patient not taking: Reported on 07/10/2017) 20 tablet 0  . oxyCODONE-acetaminophen (PERCOCET) 10-325 MG tablet Take 1 tablet by mouth 3 (three) times daily as needed for pain.    . polyethylene glycol (MIRALAX / GLYCOLAX) packet Take 17 g by mouth 2 (two) times daily. 14 each 0  . pravastatin (PRAVACHOL) 40 MG tablet Take 1 tablet (40 mg total) by mouth at bedtime. 30 tablet 0  . traZODone (DESYREL) 100 MG tablet Take 100 mg by mouth at bedtime.     No current facility-administered medications on file prior to visit.     Allergies:  Allergies  Allergen Reactions  . Augmentin [Amoxicillin-Pot Clavulanate] Nausea Only  . Wellbutrin [Bupropion] Nausea  Only   Past Medical History:  Past Medical History:  Diagnosis Date  . Anxiety   . Arthritis   . Back pain   . Cancer [facility])    endometrial cancer  . DDD (degenerative disc disease), lumbar   . Depression   . Gastroparesis   . History of kidney stones   . HLD (hyperlipidemia)   . Hx of sepsis 02/2014   DUE TO MULTIPLE KIDNEY STONES  . Hypertension   . Osteoporosis   . Pneumonia    2014  . UTI (urinary tract infection)    Past Surgical History:  Past Surgical History:  Procedure Laterality Date  . BACK SURGERY  2013  . CARPAL TUNNEL RELEASE     R hand  . CYSTOSCOPY WITH  URETEROSCOPY AND STENT PLACEMENT Bilateral 03/25/2014   Procedure: CYSTOSCOPY WITH URETEROSCOPY AND STENT PLACEMENT;  Surgeon: Raynelle Bring, MD;  Location: [facility];  Service: Urology;  Laterality: Bilateral;  . CYSTOSCOPY WITH URETEROSCOPY AND STENT PLACEMENT Bilateral 04/18/2014   Procedure: CYSTOSCOPY WITH URETEROSCOPY AND STENT PLACEMENT,RETROGRADE;  Surgeon: Raynelle Bring, MD;  Location: [facility];  Service: Urology;  Laterality: Bilateral;  . CYSTOSCOPY WITH URETEROSCOPY AND STENT PLACEMENT Right 05/30/2014   Procedure: CYSTOSCOPY WITH URETEROSCOPY AND STENT PLACEMENT;  Surgeon: Raynelle Bring, MD;  [facility]: [facility];  Service: Urology;  Laterality: Right;  . CYSTOSCOPY/RETROGRADE/URETEROSCOPY Left 05/30/2014   Procedure: LEFT RETROGRADE;  Surgeon: Raynelle Bring, MD;  Location: [facility];  Service: Urology;  Laterality: Left;  . EYE SURGERY     cataract surgery bil  . GANGLION CYST EXCISION     L ankle  . HAMMERTOE RECONSTRUCTION WITH WEIL OSTEOTOMY Left 09/05/2016   Procedure: Second Metatarsal Weil Osteotomy and Hammertoe Correction;  Surgeon: Wylene Simmer, MD;  Location: [facility];  Service: Orthopedics;  Laterality: Left;  . HOLMIUM LASER APPLICATION Bilateral 49/70/2637   Procedure: HOLMIUM LASER APPLICATION;  Surgeon: Raynelle Bring, MD;  Location[facility];  Service: Urology;  Laterality: Bilateral;  . JOINT REPLACEMENT     total knee  . LITHOTRIPSY    . LUMBAR FUSION  09/2011  . METATARSAL OSTEOTOMY WITH BUNIONECTOMY Left 09/05/2016   Procedure: Left First Metatarsal Scarf Osteotomy, Modified McBride Bunion Correction;  Surgeon: Wylene Simmer, MD;  Location: [facility];  Service: Orthopedics;  Laterality: Left;  . RHINOPLASTY    . ROBOTIC ASSISTED TOTAL HYSTERECTOMY WITH BILATERAL SALPINGO OOPHERECTOMY Bilateral 01/16/2016   Procedure: XI ROBOTIC ASSISTED TOTAL HYSTERECTOMY WITH BILATERAL SALPINGO OOPHORECTOMY AND BILATERAL PELVIC LYMPH NODE DISSECTION;  Surgeon: Everitt Amber, MD;  Location[facility];  Service: Gynecology;  Laterality: Bilateral;  . SPINAL CORD STIMULATOR INSERTION N/A 02/23/2014   Procedure: SPINAL CORD STIMULATOR PLACEMENT ;  Surgeon: Melina Schools, MD;  Location: [facility];  Service: Orthopedics;  Laterality: N/A;  . TONSILLECTOMY    . TOTAL KNEE ARTHROPLASTY Right 08/31/2012   Procedure: RIGHT TOTAL KNEE ARTHROPLASTY;  Surgeon: Mauri Pole, MD;  Location[facility];  Service: Orthopedics;  Laterality: Right;  with LMA  . TUBAL LIGATION     Social History:  Social History   Socioeconomic History  . Marital status: Divorced    Spouse name: Not on file  . Number of children: Not on file  . Years of education: Not on file  . Highest education level: Not on file  Occupational History  . Not on file  Social Needs  . Financial resource strain: Not on file  . Food insecurity:  Worry: Not on file    Inability: Not on file  . Transportation needs:    Medical: Not on file    Non-medical: Not on file  Tobacco Use  . Smoking status: Never Smoker  . Smokeless tobacco: Never Used  Substance and Sexual Activity  . Alcohol use: No  . Drug use: No  . Sexual activity: Yes  Lifestyle  . Physical activity:    Days per week: Not on file    Minutes per session: Not on file  . Stress: Not on file  Relationships  . Social connections:    Talks on phone: Not on file    Gets together: Not on file    Attends religious service: Not on file    Active member of club or organization: Not on file    Attends meetings of clubs or organizations: Not on file    Relationship status: Not on file  . Intimate partner violence:    Fear of current or ex partner: Not on file    Emotionally abused: Not on file    Physically abused: Not on file    Forced sexual activity: Not on file  Other Topics Concern  . Not on file  Social History Narrative  . Not on file   Family History:  Family History  Problem Relation Age of Onset  . Stroke Mother   . Stroke  Father     Review of Systems: Constitutional: Denies fevers, chills or abnormal weight loss Eyes: Denies blurriness of vision Ears, nose, mouth, throat, and face: Denies mucositis or sore throat Respiratory: Denies cough, dyspnea or wheezes Cardiovascular: Denies palpitation, chest discomfort or lower extremity swelling Gastrointestinal:  Denies nausea, constipation, diarrhea GU: Denies dysuria or incontinence Skin: Denies abnormal skin rashes Neurological: Per HPI Musculoskeletal: Denies joint pain, back or neck discomfort. No decrease in ROM Behavioral/Psych: Denies anxiety, disturbance in thought content, and mood instability  Physical Exam: Vitals:   07/23/17 1058  BP: 106/61  Pulse: 69  Resp: 16  Temp: (!) 97.5 F (36.4 C)  SpO2: 96%   KPS: 90. General: Alert, cooperative, pleasant, in no acute distress Head: Craniotomy scar noted, dry and intact. EENT: No conjunctival injection or scleral icterus. Oral mucosa moist Lungs: Resp effort normal Cardiac: Regular rate and rhythm Abdomen: Soft, non-distended abdomen Skin: No rashes cyanosis or petechiae. Extremities: No clubbing or edema  Neurologic Exam: Mental Status: Awake, alert, attentive to examiner. Oriented to self and environment. Language is fluent with intact comprehension.  Cranial Nerves: Visual acuity is grossly normal. Visual fields are full. Extra-ocular movements intact. No ptosis. Face is symmetric, tongue midline. Motor: Tone and bulk are normal. Power is full in both arms and legs. Reflexes are symmetric, no pathologic reflexes present. Intact finger to nose bilaterally Sensory: Intact to light touch and temperature Gait: Normal and tandem gait is normal.   Labs: I have reviewed the data as listed    Component Value Date/Time   NA 146 (H) 07/12/2017 0434   NA 141 01/10/2016 1412   K 4.1 07/12/2017 0434   K 4.0 01/10/2016 1412   CL 108 07/12/2017 0434   CO2 31 07/12/2017 0434   CO2 30 (H)  01/10/2016 1412   GLUCOSE 97 07/12/2017 0434   GLUCOSE 102 01/10/2016 1412   BUN 15 07/12/2017 0434   BUN 19.3 01/10/2016 1412   CREATININE 0.70 07/12/2017 0434   CREATININE 0.9 01/10/2016 1412   CALCIUM 9.4 07/12/2017 0434   CALCIUM 9.8 01/10/2016 1412  PROT 6.6 07/12/2017 0434   ALBUMIN 3.4 (L) 07/12/2017 0434   AST 18 07/12/2017 0434   ALT 18 07/12/2017 0434   ALKPHOS 69 07/12/2017 0434   BILITOT 0.6 07/12/2017 0434   GFRNONAA >60 07/12/2017 0434   GFRAA >60 07/12/2017 0434   Lab Results  Component Value Date   WBC 8.7 07/12/2017   NEUTROABS 6.1 07/12/2017   HGB 12.5 07/12/2017   HCT 40.5 07/12/2017   MCV 97.6 07/12/2017   PLT 227 07/12/2017   Component     Latest Ref Rng & Units 07/12/2017  Triiodothyronine,Free,Serum     2.0 - 4.4 pg/mL 1.4 (L)  T4,Free(Direct)     0.61 - 1.12 ng/dL 0.60 (L)  FSH     mIU/mL 0.7  Somatomedin C     38 - 163 ng/mL 108  LH     mIU/mL <0.2  Prolactin     4.8 - 23.3 ng/mL 4,548.0 (H)  C206 ACTH     7.2 - 63.3 pg/mL 15.4    Imaging:  Ct Angio Head W Or Wo Contrast  Addendum Date: 07/21/2017   ADDENDUM REPORT: 07/21/2017 15:44 ADDENDUM: As noted on the noncontrast CT head, from 07/10/2017, there is a 8 x 7 x 7 mm hyperdense lesion involving the anterior third ventricle, consistent with a colloid cyst. This does not show significant postcontrast enhancement. This is unchanged from the prior imaging. Electronically Signed   By: Staci Righter M.D.   On: 07/21/2017 15:44   Result Date: 07/21/2017 CLINICAL DATA:  Pituitary tumor. EXAM: CT ANGIOGRAPHY HEAD AND NECK TECHNIQUE: Multidetector CT imaging of the head and neck was performed using the standard protocol during bolus administration of intravenous contrast. Multiplanar CT image reconstructions and MIPs were obtained to evaluate the vascular anatomy. Carotid stenosis measurements (when applicable) are obtained utilizing NASCET criteria, using the distal internal carotid diameter as the  denominator. CONTRAST:  143mL ISOVUE-370 IOPAMIDOL (ISOVUE-370) INJECTION 76% COMPARISON:  CT head 07/10/2017. FINDINGS: Please note that the CTA neck was performed inadvertently as a study of the wrong body part. Patient will not be charged for this study, but is interpreted as part of the CTA head. An additional 50 mL of contrast was required to complete the CTA head study. CTA NECK Aortic arch: Standard branching. Imaged portion shows no evidence of aneurysm or dissection. No significant stenosis of the major arch vessel origins. Right carotid system: Minor atheromatous change the bifurcation. No evidence of dissection, stenosis (50% or greater) or occlusion. Left carotid system: No significant plaque at the bifurcation. No evidence of dissection, stenosis (50% or greater) or occlusion. Vertebral arteries: Codominant. No evidence of dissection, stenosis (50% or greater) or occlusion. Nonvascular soft tissues: Mild cervical spondylosis. 11 mm LEFT thyroid nodule and 6 mm LEFT thyroid nodule, which do not meet consensus for biopsy. No lung apex lesion. CTA HEAD Anterior circulation: There is elevation of both A1 segment anterior cerebral arteries in the midline due to suprasellar extension of a pituitary macroadenoma. There is lateral displacement of the LEFT internal carotid artery due to cavernous sinus extension of the tumor. Overall the homogeneously enhancing macroadenoma measures approximately 35 x 18 x 24 mm (R-L x A-P x C-C). There is osseous remodeling/erosion of the sella and skull base on the LEFT. There is mild mass effect on the medial LEFT temporal lobe. There is nonstenotic atheromatous change both cavernous internal carotid arteries. No stenosis is seen of the anterior or middle cerebral arteries. No saccular aneurysm, or vascular  malformation. Posterior circulation: Unremarkable. No significant stenosis, proximal occlusion, aneurysm, or vascular malformation. Venous sinuses: As permitted by  contrast timing, patent. Tumor fills the LEFT cavernous sinus. Anatomic variants: None of significance. Delayed phase: Other than the pituitary, no abnormal intracranial enhancement. Review of the MIP images confirms the above findings IMPRESSION: Pituitary macroadenoma measures 35 x 18 x 24 mm and extends into the suprasellar cistern as well as to the LEFT cavernous sinus. There is regional osseous remodeling/erosion. The tumor does exert mild mass effect on the medial LEFT temporal lobe. Displacement of the LEFT internal carotid artery laterally in its cavernous segment, as well as elevation of both anterior cerebral arteries in the midline. No significant vascular compromise is observed. No intrinsic stenosis of the intracranial circulation. Minor BILATERAL cavernous ICA calcification. Inadvertent imaging of the incorrect body part (CTA neck) revealed minor atheromatous change, the RIGHT carotid bifurcation, and spondylosis. See discussion above. Electronically Signed: By: Staci Righter M.D. On: 07/12/2017 13:43   Ct Head Wo Contrast  Result Date: 07/10/2017 CLINICAL DATA:  Dizziness with blurred vision EXAM: CT HEAD WITHOUT CONTRAST TECHNIQUE: Contiguous axial images were obtained from the base of the skull through the vertex without intravenous contrast. COMPARISON:  None. FINDINGS: Brain: The ventricles appear normal in size and configuration. There is a focal mass at the anterior third ventricular level showing increased attenuation. This mass measures 0.8 x 0.8 x 0.6 cm. 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 There is a mass arising from the pituitary. This mass extends into the suprasellar region to the level of the optic chiasm. This mass extends leftward and invades the cavernous sinus on the left. There is bony remodeling on the left in this area. This mass measures 2.2 cm from superior to inferior dimension, 2.8 cm from right to left dimension, and 1.7 cm from  anterior to posterior dimension. Posteriorly, this mass causes a degree of clival remodeling. No other masses are evident. There is no hemorrhage. No midline shift or extra-axial fluid collection. There is slight small vessel disease in the centra semiovale bilaterally. No acute infarct is evident. Vascular: No hyperdense vessel. There is calcification in each carotid siphon. Skull: Bony calvarium appears intact except for the changes in the perisellar region on the left and clivus due to the pituitary mass. Sinuses/Orbits: There is mucosal thickening in several ethmoid air cells. Other visualized paranasal sinuses are clear. Orbits appear symmetric bilaterally. Other: Mastoid air cells are clear. IMPRESSION: 1. Mass arising from the sella extending to the left and superiorly. The most likely differential consideration for this mass is a pituitary macroadenoma. Other lesions including glioma, craniopharyngioma, and metastasis are possibilities for this lesion. Note that there is no appreciable calcification in this mass. This lesion measures approximately 2.2 x 2.8 x 1.7 cm. 2. 8 x 8 x 6 mm focus of decreased attenuation in the anterior third ventricle. 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 3.  No appreciable hydrocephalus. 4. Mild periventricular small vessel disease. No acute hemorrhage or acute infarct. 5.  There are foci of arterial vascular calcification. 6.  Mucosal thickening in several ethmoid air cells. Comment: Nonemergent MR to evaluate the pituitary and third ventricular region masses pre and post-contrast advised. Electronically Signed   By: Lowella Grip III M.D.   On: 07/10/2017 19:02   Ct Abdomen Pelvis W Contrast  Result Date: 07/10/2017 CLINICAL DATA:  Diagnosed with urinary tract infection 2 days ago.  EXAM: CT ABDOMEN AND PELVIS WITH CONTRAST TECHNIQUE: Multidetector CT imaging of the abdomen and pelvis was performed using the standard protocol following  bolus administration of intravenous contrast. CONTRAST:  5mL ISOVUE-300 IOPAMIDOL (ISOVUE-300) INJECTION 61% COMPARISON:  May 16, 2017 FINDINGS: Lower chest: Mild atelectasis of the posterior lung bases are noted. Hepatobiliary: No focal lesion is identified within the liver. The gallbladder is normal. There is mild intra and extrahepatic biliary ductal dilatation. The common bowel duct measures 11 mm. Pancreas: Unremarkable. No pancreatic ductal dilatation or surrounding inflammatory changes. Spleen: Normal in size without focal abnormality. Adrenals/Urinary Tract: The adrenal glands are normal. There are small nonobstructing stones in both kidneys. Bilateral parapelvic renal cysts are identified. There is no hydronephrosis bilaterally. Cortical cysts are identified in the left kidney. The bladder is normal. Stomach/Bowel: Stomach is within normal limits. The appendix is not seen. No inflammation is noted around the cecum. No evidence of bowel wall thickening, distention, or inflammatory changes. There is diverticulosis of colon without diverticulitis. Vascular/Lymphatic: Aortic atherosclerosis. No enlarged abdominal or pelvic lymph nodes. Reproductive: Status post hysterectomy. No adnexal masses. Other: Small bilateral inguinal herniation of mesenteric fat is identified. There is small umbilical herniation of mesenteric fat. Musculoskeletal: Degenerative and postop changes of the lumbar spine are noted. IMPRESSION: No acute abnormality identified in the abdomen and pelvis. Nonobstructing stones in bilateral kidneys. No hydronephrosis is noted bilaterally. Nonspecific mild dilatation of intra and extrahepatic biliary ducts. Electronically Signed   By: Abelardo Diesel M.D.   On: 07/10/2017 14:21   Ct Maxillofacial W Contrast  Result Date: 07/12/2017 CLINICAL DATA:  Mass or swelling of the face. EXAM: CT MAXILLOFACIAL WITH CONTRAST TECHNIQUE: Multidetector CT imaging of the maxillofacial structures was  performed with intravenous contrast. Multiplanar CT image reconstructions were also generated. CONTRAST:  15mL ISOVUE-370 IOPAMIDOL (ISOVUE-370) INJECTION 76% COMPARISON:  CTA head and neck.  CT head. FINDINGS: Osseous: There is osseous remodeling/erosion of the sella and skull base on the LEFT related to a pituitary macroadenoma. No facial fracture is seen. Orbits: BILATERAL cataract extraction. No orbital mass. Superior orbital veins are symmetric. No proptosis. Sinuses: Tumor extends into the unicompartmental sphenoid sinus, but there is no layering fluid. Paranasal sinuses elsewhere clear. Soft tissues: No facial soft tissue swelling is evident. Limited intracranial: Reported separately. IMPRESSION: No facial fracture or soft tissue abnormality is evident. Pituitary macroadenoma with skull base involvement described separately under CTA head. Electronically Signed   By: Staci Righter M.D.   On: 07/12/2017 13:47   Dg Chest Portable 1 View  Result Date: 07/10/2017 CLINICAL DATA:  Hypotension EXAM: PORTABLE CHEST 1 VIEW COMPARISON:  May 15, 2017 FINDINGS: The heart size and mediastinal contours are stable. There is no focal infiltrate, pulmonary edema, or pleural effusion. The visualized skeletal structures are unremarkable. IMPRESSION: No active cardiopulmonary disease. Electronically Signed   By: Abelardo Diesel M.D.   On: 07/10/2017 11:24    Pathology:  Assessment/Plan 1. Pituitary macroadenoma [facility])  Ms. Frees is clinically stable.  There is no perceptible visual field impairment at this time.  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  The clinical and radiographic picture is not consistent with metastasis.   Her case was discussed in multidisciplinary tumor board today.  The recommendation is for ASAP referral to endocrinology for first line medical therapy with dopamine agonist.  This recommendation, including rationale for  withholding surgery/radiation at this time, was discussed extensively with the patient and her family.  She may have an upcoming appointment with endocrinologist Dr. Buddy Duty, but this should occur in the next 1-2 weeks.    We appreciate the opportunity to participate in the care of SHARADA ALBORNOZ.   Screening for potential clinical trials was performed and discussed using eligibility criteria for active protocols at [facility], loco-regional tertiary centers, as well as national database available on [email or web address].    The patient is not a candidate for a research protocol at this time due to no suitable study identified.   We recommend she establishes care with endocrinology, and assuming medical therapy is initiated, she can return in 3 months with a contrast enhanced CT head for evaluation.  We spent twenty additional minutes teaching regarding the natural history, biology, and historical experience in the treatment of brain tumors. We then discussed in detail the current recommendations for therapy focusing on the mode of administration, mechanism of action, anticipated toxicities, and quality of life issues associated with this plan. We also provided teaching sheets for the patient to take home as an additional resource.  All questions were answered. The patient knows to call the clinic with any problems, questions or concerns. No barriers to learning were detected.  The total time spent in the encounter was 60 minutes and more than 50% was on counseling and review of test results   Ventura Sellers, MD Medical Director of Neuro-Oncology [facility] [place] 07/23/17 10:56 AM

## 2017-07-24 ENCOUNTER — Other Ambulatory Visit: Payer: Self-pay | Admitting: *Deleted

## 2017-07-25 ENCOUNTER — Other Ambulatory Visit: Payer: Self-pay | Admitting: Internal Medicine

## 2017-07-25 DIAGNOSIS — D352 Benign neoplasm of pituitary gland: Secondary | ICD-10-CM

## 2017-07-30 DIAGNOSIS — Z79891 Long term (current) use of opiate analgesic: Secondary | ICD-10-CM | POA: Diagnosis not present

## 2017-07-30 DIAGNOSIS — M545 Low back pain, unspecified: Secondary | ICD-10-CM

## 2017-07-30 DIAGNOSIS — G894 Chronic pain syndrome: Secondary | ICD-10-CM | POA: Diagnosis not present

## 2017-07-30 DIAGNOSIS — G8929 Other chronic pain: Secondary | ICD-10-CM

## 2017-07-30 DIAGNOSIS — M961 Postlaminectomy syndrome, not elsewhere classified: Secondary | ICD-10-CM | POA: Insufficient documentation

## 2017-07-30 HISTORY — DX: Postlaminectomy syndrome, not elsewhere classified: M96.1

## 2017-07-30 HISTORY — DX: Low back pain, unspecified: M54.50

## 2017-07-30 HISTORY — DX: Other chronic pain: G89.29

## 2017-08-01 DIAGNOSIS — D352 Benign neoplasm of pituitary gland: Secondary | ICD-10-CM | POA: Diagnosis not present

## 2017-08-01 DIAGNOSIS — E038 Other specified hypothyroidism: Secondary | ICD-10-CM | POA: Diagnosis not present

## 2017-08-01 DIAGNOSIS — E230 Hypopituitarism: Secondary | ICD-10-CM | POA: Diagnosis not present

## 2017-08-01 DIAGNOSIS — E221 Hyperprolactinemia: Secondary | ICD-10-CM | POA: Diagnosis not present

## 2017-08-04 ENCOUNTER — Other Ambulatory Visit: Payer: Self-pay

## 2017-08-04 NOTE — Patient Outreach (Signed)
[name] Care Management  08/04/2017  Kimberly Larsen 1946/01/14 [id number]   Multiple attempts to establish contact with patient without success. No response from letter mailed to patient.   Plan: RN CM will close case at this time.   Jone Baseman, RN, MSN [facility] Care Management Care Management Coordinator Direct Line [phone] Toll Free: [phone]  Fax: [phone]

## 2017-08-08 DIAGNOSIS — I10 Essential (primary) hypertension: Secondary | ICD-10-CM | POA: Diagnosis not present

## 2017-08-08 DIAGNOSIS — D352 Benign neoplasm of pituitary gland: Secondary | ICD-10-CM | POA: Diagnosis not present

## 2017-08-08 DIAGNOSIS — H02839 Dermatochalasis of unspecified eye, unspecified eyelid: Secondary | ICD-10-CM | POA: Diagnosis not present

## 2017-08-08 DIAGNOSIS — H18413 Arcus senilis, bilateral: Secondary | ICD-10-CM | POA: Diagnosis not present

## 2017-08-11 DIAGNOSIS — E221 Hyperprolactinemia: Secondary | ICD-10-CM | POA: Diagnosis not present

## 2017-08-11 DIAGNOSIS — J301 Allergic rhinitis due to pollen: Secondary | ICD-10-CM | POA: Diagnosis not present

## 2017-08-11 DIAGNOSIS — E038 Other specified hypothyroidism: Secondary | ICD-10-CM | POA: Diagnosis not present

## 2017-09-05 DIAGNOSIS — E221 Hyperprolactinemia: Secondary | ICD-10-CM | POA: Diagnosis not present

## 2017-09-09 ENCOUNTER — Telehealth: Payer: Self-pay | Admitting: Oncology

## 2017-09-09 DIAGNOSIS — C541 Malignant neoplasm of endometrium: Secondary | ICD-10-CM

## 2017-09-09 NOTE — Telephone Encounter (Signed)
Left a message for patient to discuss genetics referral.  Requested a return call.

## 2017-09-10 NOTE — Telephone Encounter (Addendum)
Kimberly Larsen called back and was informed of her MSI results from her surgery on 01/16/16.  She said she would like to see genetics next month.  Advised her that we will call her with the appointment.  Referral placed for genetics.

## 2017-09-11 ENCOUNTER — Telehealth: Payer: Self-pay | Admitting: Genetics

## 2017-09-11 DIAGNOSIS — D352 Benign neoplasm of pituitary gland: Secondary | ICD-10-CM | POA: Diagnosis not present

## 2017-09-11 DIAGNOSIS — E038 Other specified hypothyroidism: Secondary | ICD-10-CM | POA: Diagnosis not present

## 2017-09-11 DIAGNOSIS — E221 Hyperprolactinemia: Secondary | ICD-10-CM | POA: Diagnosis not present

## 2017-09-11 DIAGNOSIS — E230 Hypopituitarism: Secondary | ICD-10-CM | POA: Diagnosis not present

## 2017-09-11 NOTE — Telephone Encounter (Signed)
Scheduled appt per 5/15 sch messafe - pt is aware of apt date and time.

## 2017-10-01 DIAGNOSIS — J309 Allergic rhinitis, unspecified: Secondary | ICD-10-CM | POA: Diagnosis not present

## 2017-10-01 DIAGNOSIS — Z87440 Personal history of urinary (tract) infections: Secondary | ICD-10-CM | POA: Diagnosis not present

## 2017-10-01 DIAGNOSIS — R5381 Other malaise: Secondary | ICD-10-CM | POA: Diagnosis not present

## 2017-10-02 ENCOUNTER — Other Ambulatory Visit: Payer: Self-pay

## 2017-10-02 ENCOUNTER — Telehealth: Payer: Self-pay

## 2017-10-02 DIAGNOSIS — D352 Benign neoplasm of pituitary gland: Secondary | ICD-10-CM

## 2017-10-02 NOTE — Telephone Encounter (Signed)
Contacted the patient and communicated that a lab appointment has been made at 10:15 on 6/24 prior to the CT scan since Creatinine is required for kidney function for the scan

## 2017-10-02 NOTE — Telephone Encounter (Signed)
Contacted the patient and communicated that a lab appointment has been scheduled at 10:15 on 6/24, prior to the CT scan, because this is necessary for the CT scan. Patient verbalized understanding and stated that she is having labs by her primary MD next week and will ask him to fax them in. She would like a call back if the labs suffice for the CT scan or if she will need to keep the appointment on the 24th.

## 2017-10-08 ENCOUNTER — Telehealth: Payer: Self-pay | Admitting: *Deleted

## 2017-10-08 DIAGNOSIS — E038 Other specified hypothyroidism: Secondary | ICD-10-CM | POA: Diagnosis not present

## 2017-10-08 DIAGNOSIS — D352 Benign neoplasm of pituitary gland: Secondary | ICD-10-CM | POA: Diagnosis not present

## 2017-10-08 NOTE — Telephone Encounter (Signed)
Voicemail received from Kimberly Larsen requesting return call confirming receipt of today's message in reference to Dr. Renda Rolls request for additional lab test.  Lab was drawn, results will be available tomorrow, now Dr, Mickeal Skinner needs to request results be faxed to him.  Message forwarded to collaborative extension for further patient communication and assistance as needed.  Called patient with confirmation of receipt of this communication.  Provided provider information.  Updated care team with Dr. Delrae Rend, [facility].

## 2017-10-09 ENCOUNTER — Other Ambulatory Visit: Payer: Self-pay | Admitting: *Deleted

## 2017-10-15 DIAGNOSIS — I10 Essential (primary) hypertension: Secondary | ICD-10-CM | POA: Diagnosis not present

## 2017-10-15 DIAGNOSIS — M6289 Other specified disorders of muscle: Secondary | ICD-10-CM | POA: Diagnosis not present

## 2017-10-15 DIAGNOSIS — J309 Allergic rhinitis, unspecified: Secondary | ICD-10-CM | POA: Diagnosis not present

## 2017-10-15 DIAGNOSIS — R51 Headache: Secondary | ICD-10-CM | POA: Diagnosis not present

## 2017-10-15 DIAGNOSIS — E785 Hyperlipidemia, unspecified: Secondary | ICD-10-CM | POA: Diagnosis not present

## 2017-10-15 DIAGNOSIS — Z87440 Personal history of urinary (tract) infections: Secondary | ICD-10-CM | POA: Diagnosis not present

## 2017-10-20 ENCOUNTER — Ambulatory Visit (HOSPITAL_COMMUNITY)
Admission: RE | Admit: 2017-10-20 | Discharge: 2017-10-20 | Disposition: A | Discharge status: Home / Self Care | Payer: Medicare Other | Source: Ambulatory Visit | Attending: Internal Medicine | Admitting: Internal Medicine

## 2017-10-20 ENCOUNTER — Other Ambulatory Visit: Payer: Medicare Other

## 2017-10-20 DIAGNOSIS — Q046 Congenital cerebral cysts: Secondary | ICD-10-CM | POA: Insufficient documentation

## 2017-10-20 DIAGNOSIS — D352 Benign neoplasm of pituitary gland: Secondary | ICD-10-CM | POA: Insufficient documentation

## 2017-10-20 MED ORDER — IOHEXOL 300 MG/ML  SOLN
75.0000 mL | Freq: Once | INTRAMUSCULAR | Status: AC | PRN
  Administered 2017-10-20: 75 mL via INTRAVENOUS

## 2017-10-23 ENCOUNTER — Encounter: Payer: Self-pay | Admitting: Internal Medicine

## 2017-10-23 ENCOUNTER — Inpatient Hospital Stay: Payer: Medicare Other | Attending: Internal Medicine | Admitting: Internal Medicine

## 2017-10-23 ENCOUNTER — Telehealth: Payer: Self-pay | Admitting: Internal Medicine

## 2017-10-23 VITALS — BP 148/65 | HR 67 | Temp 97.5°F | Resp 18 | Ht 61.0 in | Wt 195.6 lb

## 2017-10-23 DIAGNOSIS — R519 Headache, unspecified: Secondary | ICD-10-CM

## 2017-10-23 DIAGNOSIS — D352 Benign neoplasm of pituitary gland: Secondary | ICD-10-CM

## 2017-10-23 DIAGNOSIS — R51 Headache: Secondary | ICD-10-CM | POA: Insufficient documentation

## 2017-10-23 HISTORY — DX: Headache, unspecified: R51.9

## 2017-10-23 NOTE — Progress Notes (Signed)
[facility] [place], [place] [address] [phone]   Interval Evaluation  Date of Service: 10/23/17 Patient Name: Kimberly Larsen Patient MRN: [id number] Patient DOB: [birth year] Provider: Ventura Sellers, MD  Identifying Statement:  Kimberly Larsen is a 72 y.o. 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    Referring Provider: Ernestene Kiel, MD [place]. [place],  [address]  Interval History:  Kimberly Larsen presents today after recent MRI brain.  She has been taking cabergoline since our referral to endcorinology for her prolactinoma.  She denies new or progressive neurologic deficits or seizures.  No issues with the medication, as prolactin level has apparently decreased.  She does continue to have daily headaches, dosing at least twice per day with oxycodone for pain relief.  Medications: Current Outpatient Medications on File Prior to Visit  Medication Sig Dispense Refill  . ALPRAZolam (XANAX) 0.25 MG tablet Take 0.25 mg by mouth 3 (three) times daily as needed.    Marland Kitchen aspirin EC 81 MG tablet Take 81 mg by mouth daily.    . B Complex Vitamins (B COMPLEX PO) Take 1 tablet by mouth daily.    . cabergoline (DOSTINEX) 0.5 MG tablet Take 1.5 tablets by mouth 2 (two) times a week. Tuesday's and Fridays  4  . docusate sodium (COLACE) 100 MG capsule Take 1 capsule (100 mg total) by mouth 2 (two) times daily. While taking narcotic pain medicine. 30 capsule 0  . DULoxetine (CYMBALTA) 60 MG capsule Take 60 mg by mouth daily.    . fluticasone (FLONASE) 50 MCG/ACT nasal spray Place 1 spray into both nostrils daily.    Marland Kitchen gabapentin (NEURONTIN) 600 MG tablet Take 600 mg by mouth 3 (three) times daily.    Marland Kitchen levothyroxine (SYNTHROID, LEVOTHROID) 50 MCG tablet Take 1 tablet by mouth daily before breakfast.  5  . Magnesium 250 MG TABS Take 1 tablet by mouth daily.    . Melatonin 10 MG CAPS Take 10  mg by mouth at bedtime.    . metoprolol succinate (TOPROL-XL) 25 MG 24 hr tablet Take 25 mg by mouth daily.   0  . montelukast (SINGULAIR) 10 MG tablet Take 10 mg by mouth daily.  1  . Multiple Vitamin (MULTIVITAMIN WITH MINERALS) TABS Take 1 tablet by mouth daily.    . Multiple Vitamins-Minerals (HAIR SKIN AND NAILS FORMULA PO) Take 1 tablet by mouth daily.    . Omega-3 Fatty Acids (FISH OIL) 600 MG CAPS Take 1 capsule by mouth 2 (two) times daily. 30 capsule   . ondansetron (ZOFRAN) 4 MG tablet Take 1 tablet by mouth every 6 (six) hours as needed.    Marland Kitchen oxyCODONE-acetaminophen (PERCOCET) 10-325 MG tablet Take 1 tablet by mouth 3 (three) times daily as needed for pain.    . polyethylene glycol (MIRALAX / GLYCOLAX) packet Take 17 g by mouth 2 (two) times daily. 14 each 0  . pravastatin (PRAVACHOL) 40 MG tablet Take 1 tablet (40 mg total) by mouth at bedtime. 30 tablet 0  . traZODone (DESYREL) 100 MG tablet Take 100 mg by mouth at bedtime.    Marland Kitchen alendronate (FOSAMAX) 70 MG tablet Take 70 mg by mouth once a week.    . metaxalone (SKELAXIN) 800 MG tablet Take 800 mg by mouth 3 (three) times daily as needed.  2  . naloxone (NARCAN) nasal spray 4 mg/0.1 mL Place 1 spray into  the nose See admin instructions. Take by nasal route every 3 minutes until patient awakes or EMS arrives.     No current facility-administered medications on file prior to visit.     Allergies:  Allergies  Allergen Reactions  . Augmentin [Amoxicillin-Pot Clavulanate] Nausea Only  . Wellbutrin [Bupropion] Nausea Only   Past Medical History:  Past Medical History:  Diagnosis Date  . Anxiety   . Arthritis   . Back pain   . Cancer [facility])    endometrial cancer  . DDD (degenerative disc disease), lumbar   . Depression   . Gastroparesis   . History of kidney stones   . HLD (hyperlipidemia)   . Hx of sepsis 02/2014   DUE TO MULTIPLE KIDNEY STONES  . Hypertension   . Osteoporosis   . Pneumonia    2014  . UTI (urinary  tract infection)    Past Surgical History:  Past Surgical History:  Procedure Laterality Date  . BACK SURGERY  2013  . CARPAL TUNNEL RELEASE     R hand  . CYSTOSCOPY WITH URETEROSCOPY AND STENT PLACEMENT Bilateral 03/25/2014   Procedure: CYSTOSCOPY WITH URETEROSCOPY AND STENT PLACEMENT;  Surgeon: Raynelle Bring, MD;  Location: [facility];  Service: Urology;  Laterality: Bilateral;  . CYSTOSCOPY WITH URETEROSCOPY AND STENT PLACEMENT Bilateral 04/18/2014   Procedure: CYSTOSCOPY WITH URETEROSCOPY AND STENT PLACEMENT,RETROGRADE;  Surgeon: Raynelle Bring, MD;  Location: [facility];  Service: Urology;  Laterality: Bilateral;  . CYSTOSCOPY WITH URETEROSCOPY AND STENT PLACEMENT Right 05/30/2014   Procedure: CYSTOSCOPY WITH URETEROSCOPY AND STENT PLACEMENT;  Surgeon: Raynelle Bring, MD;  Location: [facility];  Service: Urology;  Laterality: Right;  . CYSTOSCOPY/RETROGRADE/URETEROSCOPY Left 05/30/2014   Procedure: LEFT RETROGRADE;  Surgeon: Raynelle Bring, MD;  Location: [facility];  Service: Urology;  Laterality: Left;  . EYE SURGERY     cataract surgery bil  . GANGLION CYST EXCISION     L ankle  . HAMMERTOE RECONSTRUCTION WITH WEIL OSTEOTOMY Left 09/05/2016   Procedure: Second Metatarsal Weil Osteotomy and Hammertoe Correction;  Surgeon: Wylene Simmer, MD;  Location: [place];  Service: Orthopedics;  Laterality: Left;  . HOLMIUM LASER APPLICATION Bilateral 54/12/8117   Procedure: HOLMIUM LASER APPLICATION;  Surgeon: Raynelle Bring, MD;  Location: [facility];  Service: Urology;  Laterality: Bilateral;  . JOINT REPLACEMENT     total knee  . LITHOTRIPSY    . LUMBAR FUSION  09/2011  . METATARSAL OSTEOTOMY WITH BUNIONECTOMY Left 09/05/2016   Procedure: Left First Metatarsal Scarf Osteotomy, Modified McBride Bunion Correction;  Surgeon: Wylene Simmer, MD;  Location: [place];  Service: Orthopedics;  Laterality: Left;  . RHINOPLASTY    . ROBOTIC ASSISTED TOTAL HYSTERECTOMY WITH BILATERAL SALPINGO  OOPHERECTOMY Bilateral 01/16/2016   Procedure: XI ROBOTIC ASSISTED TOTAL HYSTERECTOMY WITH BILATERAL SALPINGO OOPHORECTOMY AND BILATERAL PELVIC LYMPH NODE DISSECTION;  Surgeon: Everitt Amber, MD;  Location: [facility];  Service: Gynecology;  Laterality: Bilateral;  . SPINAL CORD STIMULATOR INSERTION N/A 02/23/2014   Procedure: SPINAL CORD STIMULATOR PLACEMENT ;  Surgeon: Melina Schools, MD;  Location: [place];  Service: Orthopedics;  Laterality: N/A;  . TONSILLECTOMY    . TOTAL KNEE ARTHROPLASTY Right 08/31/2012   Procedure: RIGHT TOTAL KNEE ARTHROPLASTY;  Surgeon: Mauri Pole, MD;  Location: [facility];  Service: Orthopedics;  Laterality: Right;  with LMA  . TUBAL LIGATION     Social History:  Social History   Socioeconomic History  . Marital status: Divorced    Spouse name:  Not on file  . Number of children: Not on file  . Years of education: Not on file  . Highest education level: Not on file  Occupational History  . Not on file  Social Needs  . Financial resource strain: Not on file  . Food insecurity:    Worry: Not on file    Inability: Not on file  . Transportation needs:    Medical: Not on file    Non-medical: Not on file  Tobacco Use  . Smoking status: Never Smoker  . Smokeless tobacco: Never Used  Substance and Sexual Activity  . Alcohol use: No  . Drug use: No  . Sexual activity: Yes  Lifestyle  . Physical activity:    Days per week: Not on file    Minutes per session: Not on file  . Stress: Not on file  Relationships  . Social connections:    Talks on phone: Not on file    Gets together: Not on file    Attends religious service: Not on file    Active member of club or organization: Not on file    Attends meetings of clubs or organizations: Not on file    Relationship status: Not on file  . Intimate partner violence:    Fear of current or ex partner: Not on file    Emotionally abused: Not on file    Physically abused: Not on file    Forced sexual activity: Not on file    Other Topics Concern  . Not on file  Social History Narrative  . Not on file   Family History:  Family History  Problem Relation Age of Onset  . Stroke Mother   . Stroke Father     Review of Systems: Constitutional: Denies fevers, chills or abnormal weight loss Eyes: Denies blurriness of vision Ears, nose, mouth, throat, and face: Denies mucositis or sore throat Respiratory: Denies cough, dyspnea or wheezes Cardiovascular: Denies palpitation, chest discomfort or lower extremity swelling Gastrointestinal:  Denies nausea, constipation, diarrhea GU: Denies dysuria or incontinence Skin: Denies abnormal skin rashes Neurological: Per HPI Musculoskeletal: Back pain Behavioral/Psych: Denies anxiety, disturbance in thought content, and mood instability  Physical Exam: Vitals:   10/23/17 1141  BP: (!) 148/65  Pulse: 67  Resp: 18  Temp: (!) 97.5 F (36.4 C)  SpO2: 99%   KPS: 90. General: Alert, cooperative, pleasant, in no acute distress Head: Craniotomy scar noted, dry and intact. EENT: No conjunctival injection or scleral icterus. Oral mucosa moist Lungs: Resp effort normal Cardiac: Regular rate and rhythm Abdomen: Soft, non-distended abdomen Skin: No rashes cyanosis or petechiae. Extremities: No clubbing or edema  Neurologic Exam: Mental Status: Awake, alert, attentive to examiner. Oriented to self and environment. Language is fluent with intact comprehension.  Cranial Nerves: Visual acuity is grossly normal. Visual fields are full. Extra-ocular movements intact. No ptosis. Face is symmetric, tongue midline. Motor: Tone and bulk are normal. Power is full in both arms and legs. Reflexes are symmetric, no pathologic reflexes present. Intact finger to nose bilaterally Sensory: Intact to light touch and temperature Gait: Normal and tandem gait is normal.   Labs: I have reviewed the data as listed    Component Value Date/Time   NA 146 (H) 07/12/2017 0434   NA 141  01/10/2016 1412   K 4.1 07/12/2017 0434   K 4.0 01/10/2016 1412   CL 108 07/12/2017 0434   CO2 31 07/12/2017 0434   CO2 30 (H) 01/10/2016 1412  GLUCOSE 97 07/12/2017 0434   GLUCOSE 102 01/10/2016 1412   BUN 15 07/12/2017 0434   BUN 19.3 01/10/2016 1412   CREATININE 0.70 07/12/2017 0434   CREATININE 0.9 01/10/2016 1412   CALCIUM 9.4 07/12/2017 0434   CALCIUM 9.8 01/10/2016 1412   PROT 6.6 07/12/2017 0434   ALBUMIN 3.4 (L) 07/12/2017 0434   AST 18 07/12/2017 0434   ALT 18 07/12/2017 0434   ALKPHOS 69 07/12/2017 0434   BILITOT 0.6 07/12/2017 0434   GFRNONAA >60 07/12/2017 0434   GFRAA >60 07/12/2017 0434   Lab Results  Component Value Date   WBC 8.7 07/12/2017   NEUTROABS 6.1 07/12/2017   HGB 12.5 07/12/2017   HCT 40.5 07/12/2017   MCV 97.6 07/12/2017   PLT 227 07/12/2017   Component     Latest Ref Rng & Units 07/12/2017  Triiodothyronine,Free,Serum     2.0 - 4.4 pg/mL 1.4 (L)  T4,Free(Direct)     0.61 - 1.12 ng/dL 0.60 (L)  FSH     mIU/mL 0.7  Somatomedin C     38 - 163 ng/mL 108  LH     mIU/mL <0.2  Prolactin     4.8 - 23.3 ng/mL 4,548.0 (H)  C206 ACTH     7.2 - 63.3 pg/mL 15.4    Imaging:  Imaging:  [facility] Clinician Interpretation: I have personally reviewed the CNS images as listed.  My interpretation, in the context of the patient's clinical presentation, is stable disease  Ct Head W Wo Contrast  Result Date: 10/20/2017 CLINICAL DATA:  Headache.  Follow-up pituitary macro adenoma EXAM: CT HEAD WITHOUT AND WITH CONTRAST TECHNIQUE: Contiguous axial images were obtained from the base of the skull through the vertex without and with intravenous contrast CONTRAST:  21mL OMNIPAQUE IOHEXOL 300 MG/ML  SOLN COMPARISON:  CT 07/10/2017, 07/12/2017 FINDINGS: Brain: Mass lesion arising from the left side of the pituitary and extending into the left cavernous sinus. This shows mild heterogeneous enhancement and measures approximately 13 x 12 x 26 mm. No compression of the  optic chiasm. The mass is smaller compared to the prior study with less extension into the suprasellar cistern. Colloid cyst in the anterior superior third ventricle measures 8.5 mm and is unchanged. Ventricles remain within normal limits. Patchy hypodensity in the cerebral white matter bilaterally consistent with mild chronic microvascular ischemia. No acute infarct. Vascular: Negative for hyperdense vessel. Normal vascular enhancement. Skull: Thinning of the left sella due to pituitary macro adenoma. No other skeletal lesion. Sinuses/Orbits: Paranasal sinuses clear.  Bilateral cataract surgery Other: None IMPRESSION: Pituitary macro adenoma within normal decrease in size. Decreased in tumor extending into the suprasellar cistern. Tumor extends into the left cavernous sinus as noted previously 8.5 mm colloid cyst in the third ventricle unchanged. Negative for hydrocephalus Negative for acute infarct. Electronically Signed   By: Franchot Gallo M.D.   On: 10/20/2017 13:19     Pathology:  Assessment/Plan 1. Pituitary macroadenoma [facility])  Ms. Bojarski is clinically and radiographically stable today regarding her prolactinoma.  She should continue to take cabergoline given the encouraging response thus far.  For headaches, we discussed her chronic use of opiates and its relationship to chronic pain such as chronic daily headache.  She will attempt to (very) carefully wean down the amount of oxycodone she takes.  She will start by using a calendar to track her total intake.  We encouraged her to continue to reach out to [place] regarding the headaches.  We did not  recommend she discontinue either gabapentin or cymbalta at this time although that could be considered once analgesia is decreased appropriately.  We appreciate the opportunity to participate in the care of Kimberly Larsen.   She should return in 4 months with a contrast enhanced CT head for evaluation.  All questions were answered. The patient knows to  call the clinic with any problems, questions or concerns. No barriers to learning were detected.  The total time spent in the encounter was 40 minutes and more than 50% was on counseling and review of test results   Ventura Sellers, MD Medical Director of Neuro-Oncology [facility] [place] 10/23/17 2:06 PM

## 2017-10-23 NOTE — Telephone Encounter (Signed)
Scheduled appt per 6/27 los - gave patient AVS and calender per los.  

## 2017-10-28 ENCOUNTER — Inpatient Hospital Stay: Payer: Medicare Other

## 2017-10-28 ENCOUNTER — Inpatient Hospital Stay: Payer: Medicare Other | Attending: Internal Medicine | Admitting: Genetics

## 2017-10-28 DIAGNOSIS — Z8542 Personal history of malignant neoplasm of other parts of uterus: Secondary | ICD-10-CM | POA: Diagnosis not present

## 2017-10-28 DIAGNOSIS — D352 Benign neoplasm of pituitary gland: Secondary | ICD-10-CM

## 2017-10-28 DIAGNOSIS — R8789 Other abnormal findings in specimens from female genital organs: Secondary | ICD-10-CM | POA: Diagnosis not present

## 2017-10-28 DIAGNOSIS — C541 Malignant neoplasm of endometrium: Secondary | ICD-10-CM

## 2017-10-28 DIAGNOSIS — Z315 Encounter for genetic counseling: Secondary | ICD-10-CM

## 2017-10-28 DIAGNOSIS — Z86010 Personal history of colonic polyps: Secondary | ICD-10-CM | POA: Diagnosis not present

## 2017-10-29 ENCOUNTER — Encounter: Payer: Self-pay | Admitting: Genetics

## 2017-10-29 NOTE — Progress Notes (Addendum)
REFERRING PROVIDER: Dorothyann Gibbs, NP [place], [place] [id number]  PRIMARY PROVIDER:  Ernestene Kiel, MD  PRIMARY REASON FOR VISIT:  1. Endometrial cancer ([place])     HISTORY OF PRESENT ILLNESS:   Kimberly Larsen, a 72 y.o. female, was seen for a [place] cancer genetics consultation at the request of Kimberly Larsen due to a personal history of endometrial cancer.  Kimberly Larsen presents to clinic today to discuss the possibility of a hereditary predisposition to cancer, genetic testing, and to further clarify her future cancer risks, as well as potential cancer risks for family members.   In 2017, at the age of 17, Kimberly Larsen was diagnosed with endometrial adenocarcinoma.  She underwent hysterectomy with BSO.  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  She had IHC testing performed on her endometrial cancer that was abnormal and revealed a loss of MSH6.   CANCER HISTORY:    Endometrial cancer ([place])   12/29/2015 Initial Diagnosis    Endometrial cancer [facility])       Genetic Testing    Patient has genetic testing done for mismatch repair protien. Results revealed patient has the following mutation(s): Abnormal: MSH6: loss of nuclear expression (less than 5% tumor expression).      Menarche was at age 65.  First live birth at age 53.  Ovaries intact: no.  Hysterectomy: yes.  Menopausal status: postmenopausal.  HRT use: 1 yr or less years. Colonoscopy: yes; at age 89 and 41, most recent c-scope revealed 1 polyps, she reports she was told to have another colonocspy in 5 years. . Mammogram within the last year: yes. Number of breast biopsies: 0.  Additional history was obtained to assess for Multiple endocrine Neoplasia 1( MEN1) given her history of a pituitary macroadenoma:  No hx or fhx of hypo/hyper parathyroidism/parathyroid adenomas No phx or fhx of hyper/hypocalcemia No phx or fhx of carcinoid tumors/ other  neuroendocrine tumors  Reported No phx of thyroid nodules phx of hypthyroidism.  No adrenal/Corticool disease reported No myxomas reported  Kimberly Larsen and/or her family has no other features of MEN1 syndrome/Carney Comples so suspicion for this condition is low.    Past Medical History:  Diagnosis Date  . Anxiety   . Arthritis   . Back pain   . Cancer [facility])    endometrial cancer  . DDD (degenerative disc disease), lumbar   . Depression   . Gastroparesis   . History of kidney stones   . HLD (hyperlipidemia)   . Hx of sepsis 02/2014   DUE TO MULTIPLE KIDNEY STONES  . Hypertension   . Osteoporosis   . Pneumonia    2014  . UTI (urinary tract infection)     Past Surgical History:  Procedure Laterality Date  . BACK SURGERY  2013  . CARPAL TUNNEL RELEASE     R hand  . CYSTOSCOPY WITH URETEROSCOPY AND STENT PLACEMENT Bilateral 03/25/2014   Procedure: CYSTOSCOPY WITH URETEROSCOPY AND STENT PLACEMENT;  Surgeon: Raynelle Bring, MD;  Location: [facility];  Service: Urology;  Laterality: Bilateral;  . CYSTOSCOPY WITH URETEROSCOPY AND STENT PLACEMENT Bilateral 04/18/2014   Procedure: CYSTOSCOPY WITH URETEROSCOPY AND STENT PLACEMENT,RETROGRADE;  Surgeon: Raynelle Bring, MD;  Location: [facility];  Service: Urology;  Laterality: Bilateral;  . CYSTOSCOPY WITH URETEROSCOPY AND STENT PLACEMENT Right 05/30/2014   Procedure: CYSTOSCOPY WITH URETEROSCOPY AND STENT PLACEMENT;  Surgeon: Raynelle Bring, MD;  Location: [facility];  Service:  Urology;  Laterality: Right;  . CYSTOSCOPY/RETROGRADE/URETEROSCOPY Left 05/30/2014   Procedure: LEFT RETROGRADE;  Surgeon: Raynelle Bring, MD;  Location: [facility];  Service: Urology;  Laterality: Left;  . EYE SURGERY     cataract surgery bil  . GANGLION CYST EXCISION     L ankle  . HAMMERTOE RECONSTRUCTION WITH WEIL OSTEOTOMY Left 09/05/2016   Procedure: Second Metatarsal Weil Osteotomy and Hammertoe Correction;  Surgeon: Wylene Simmer, MD;  Location: [place];   Service: Orthopedics;  Laterality: Left;  . HOLMIUM LASER APPLICATION Bilateral 56/97/9480   Procedure: HOLMIUM LASER APPLICATION;  Surgeon: Raynelle Bring, MD;  Location: [facility];  Service: Urology;  Laterality: Bilateral;  . JOINT REPLACEMENT     total knee  . LITHOTRIPSY    . LUMBAR FUSION  09/2011  . METATARSAL OSTEOTOMY WITH BUNIONECTOMY Left 09/05/2016   Procedure: Left First Metatarsal Scarf Osteotomy, Modified McBride Bunion Correction;  Surgeon: Wylene Simmer, MD;  Location: [place];  Service: Orthopedics;  Laterality: Left;  . RHINOPLASTY    . ROBOTIC ASSISTED TOTAL HYSTERECTOMY WITH BILATERAL SALPINGO OOPHERECTOMY Bilateral 01/16/2016   Procedure: XI ROBOTIC ASSISTED TOTAL HYSTERECTOMY WITH BILATERAL SALPINGO OOPHORECTOMY AND BILATERAL PELVIC LYMPH NODE DISSECTION;  Surgeon: Everitt Amber, MD;  Location[facility];  Service: Gynecology;  Laterality: Bilateral;  . SPINAL CORD STIMULATOR INSERTION N/A 02/23/2014   Procedure: SPINAL CORD STIMULATOR PLACEMENT ;  Surgeon: Melina Schools, MD;  Location: [place];  Service: Orthopedics;  Laterality: N/A;  . TONSILLECTOMY    . TOTAL KNEE ARTHROPLASTY Right 08/31/2012   Procedure: RIGHT TOTAL KNEE ARTHROPLASTY;  Surgeon: Mauri Pole, MD;  [facility];  Service: Orthopedics;  Laterality: Right;  with LMA  . TUBAL LIGATION      Social History   Socioeconomic History  . Marital status: Divorced    Spouse name: Not on file  . Number of children: Not on file  . Years of education: Not on file  . Highest education level: Not on file  Occupational History  . Not on file  Social Needs  . Financial resource strain: Not on file  . Food insecurity:    Worry: Not on file    Inability: Not on file  . Transportation needs:    Medical: Not on file    Non-medical: Not on file  Tobacco Use  . Smoking status: Never Smoker  . Smokeless tobacco: Never Used  Substance and Sexual Activity  . Alcohol use: No  . Drug use: No  . Sexual  activity: Yes  Lifestyle  . Physical activity:    Days per week: Not on file    Minutes per session: Not on file  . Stress: Not on file  Relationships  . Social connections:    Talks on phone: Not on file    Gets together: Not on file    Attends religious service: Not on file    Active member of club or organization: Not on file    Attends meetings of clubs or organizations: Not on file    Relationship status: Not on file  Other Topics Concern  . Not on file  Social History Narrative  . Not on file     FAMILY HISTORY:  We obtained a detailed, 4-generation family history.  Significant diagnoses are listed below: Family History  Problem Relation Age of Onset  . Stroke Mother   . Emphysema Mother   . COPD Mother   . Stroke Father   . Heart disease  Father   . Emphysema Father    Kimberly Larsen has a 66 year-old son with no history of cancer. Kimberly Larsen has a 60 year-old brother who has no history of cancer, but has heart disease.   Kimberly Larsen father: died at 60 due to heart disease, emphysema, CHD Paternal Aunts/Uncles: 2 paternal aunts, 2 paternal uncles. Deceased, limited information about these reltaives.  Paternal cousins: limited info/unk, most are still living Paternal grandfather: deceased, unk cause Paternal grandmother:died in her 88's unk cause  Kimberly Larsen's mother: died at 57 due to strokes.  She also had COPD and Emphysema.  She had a DNC and hysterectomy in her 55's.  Maternal Aunts/Uncles: 2 maternal aunts, 4 maternal uncles, and 3 maternal half auntsa nd 3 maternal half uncles.  Limited information available about these relatives.  Maternal cousins: no known history of cancer, limited info Maternal grandfather: died in his 49's, unk cause Maternal grandmother:died in her 95's unk cause  Kimberly Larsen is unaware of previous family history of genetic testing for hereditary cancer risks. Patient's maternal ancestors are of Northern European/[place] descent, and paternal  ancestors are of Northern European descent. There is no reported Ashkenazi Jewish ancestry. There is no known consanguinity.  GENETIC COUNSELING ASSESSMENT: LEISEL PINETTE is a 72 y.o. female with a personal history which is somewhat suggestive of a Hereditary Cancer Predisposition Syndrome. We, therefore, discussed and recommended the following at today's visit.   DISCUSSION: We reviewed the characteristics, features and inheritance patterns of hereditary cancer syndromes. We also discussed genetic testing, including the appropriate family members to test, the process of testing, insurance coverage and turn-around-time for results. We discussed the implications of a negative, positive and/or variant of uncertain significant result. We recommended Kimberly Larsen pursue genetic testing for the TumorNextLynch + CancerNext gene panel.   Tumornext Lynch analyzes somatic mutations in the Lynch syndrome genes paired with germline testing (cancernext). The CancerNext gene panel offered by [employer] includes sequencing and rearrangement analysis for the following 34 genes:   APC, ATM, BARD1, BMPR1A, BRCA1, BRCA2, BRIP1, CDH1, CDK4, CDKN2A, CHEK2, DICER1, EPCAM, GREM1, HOXB13MLH1, MRE11A, MSH2, MSH6, MUTYH, NBN, NF1, PALB2, PMS2, POLD1, POLE, PTEN, RAD50, RAD51D, SMAD4, SMARCA4, STK11, and TP53.   We discussed that only 5-10% of cancers are associated with a Hereditary Cancer Predisposition Syndrome.  The most common hereditary cancer syndrome associated with colon cancer is Lynch Syndrome.  Lynch Syndrome is caused by mutations in the genes: MLH1, MSH2, MSH6, PMS2 and EPCAM.  This syndrome increases the risk for colon, uterine, ovarian and stomach cancers, as well as others.  Families with Lynch Syndrome tend to have multiple family members with these cancers, typically diagnosed under age 54, and diagnoses in multiple generations.    We discussed that there are several other genes that are associated with an  increased risk for colon cancer and increased polyp burden (MUTYH, APC, POLE, CHEK2, etc.) We also dicussed that there are many genes that cause many different types of cancer risks.    Kimberly Larsen had intact Immuno-histo chemistry (IHC) performed on her tumor on that was abnormal and showed loss of MSH6. This could be due to somatic or germline mutations.  Analysis of the tumor and germline samples can be performed to identify the cause of this abnormal IHC result.   We discussed that if she is found to have a mutation in one of these genes, it may impact future medical management recommendations such as increased cancer screenings and consideration  of risk reducing surgeries.  A positive result could also have implications for the patient's family members.  A Negative result would mean we were unable to identify a hereditary component to her cancer, but does not rule out the possibility of a hereditary basis for her cancer.  There could be mutations that are undetectable by current technology, or in genes not yet tested or identified to increase cancer risk.    We discussed the potential to find a Variant of Uncertain Significance or VUS.  These are variants that have not yet been identified as pathogenic or benign, and it is unknown if this variant is associated with increased cancer risk or if this is a normal finding.  Most VUS's are reclassified to benign or likely benign.   It should not be used to make medical management decisions. With time, we suspect the lab will determine the significance of any VUS's identified if any.   Based on Kimberly Larsen's personal history of cancer, she meets medical criteria for genetic testing. Despite that she meets criteria, she may still have an out of pocket cost. The laboratory can provide her with an estimate of her OOP cost.   PLAN: After considering the risks, benefits, and limitations, Kimberly Larsen  provided informed consent to pursue genetic testing and the blood  sample was sent to [facility] for analysis of the TumorNext Lynch + CancerNext Panel. Results should be available within approximately 4-6 weeks' time, at which point they will be disclosed by telephone to Kimberly Larsen, as will any additional recommendations warranted by these results. Ms. Gehrig will receive a summary of her genetic counseling visit and a copy of her results once available. This information will also be available in Epic. We encouraged Ms. Jobst to remain in contact with cancer genetics annually so that we can continuously update the family history and inform her of any changes in cancer genetics and testing that may be of benefit for her family. Ms. Callies questions were answered to her satisfaction today. Our contact information was provided should additional questions or concerns arise.  Lastly, we encouraged Ms. Ruscitti to remain in contact with cancer genetics annually so that we can continuously update the family history and inform her of any changes in cancer genetics and testing that may be of benefit for this family.   Ms.  Arterburn questions were answered to her satisfaction today. Our contact information was provided should additional questions or concerns arise. Thank you for the referral and allowing [place] to share in the care of your patient.   Tana Felts, MS, [facility] Certified Genetic Counselor [email or web address] phone: [phone]  The patient was seen for a total of 40 minutes in face-to-face genetic counseling.

## 2017-11-03 ENCOUNTER — Telehealth: Payer: Self-pay | Admitting: *Deleted

## 2017-11-03 NOTE — Telephone Encounter (Signed)
Patient called to ask if she could proceed with getting routine mammogram and if it was ok to take Estroven supplement for night sweats.  Per Dr Mickeal Skinner ok.  LM to advise patient on her voicemail

## 2017-11-07 DIAGNOSIS — D443 Neoplasm of uncertain behavior of pituitary gland: Secondary | ICD-10-CM | POA: Diagnosis not present

## 2017-11-07 DIAGNOSIS — H02839 Dermatochalasis of unspecified eye, unspecified eyelid: Secondary | ICD-10-CM | POA: Diagnosis not present

## 2017-11-07 DIAGNOSIS — Z961 Presence of intraocular lens: Secondary | ICD-10-CM | POA: Diagnosis not present

## 2017-11-07 DIAGNOSIS — H18413 Arcus senilis, bilateral: Secondary | ICD-10-CM | POA: Diagnosis not present

## 2017-11-11 DIAGNOSIS — D352 Benign neoplasm of pituitary gland: Secondary | ICD-10-CM | POA: Diagnosis not present

## 2017-11-15 ENCOUNTER — Emergency Department (HOSPITAL_COMMUNITY): Payer: Medicare Other

## 2017-11-15 ENCOUNTER — Encounter (HOSPITAL_COMMUNITY): Payer: Self-pay

## 2017-11-15 ENCOUNTER — Emergency Department (HOSPITAL_COMMUNITY)
Admission: EM | Admit: 2017-11-15 | Discharge: 2017-11-15 | Disposition: A | Discharge status: Home / Self Care | Payer: Medicare Other | Attending: Emergency Medicine | Admitting: Emergency Medicine

## 2017-11-15 DIAGNOSIS — Z96651 Presence of right artificial knee joint: Secondary | ICD-10-CM | POA: Insufficient documentation

## 2017-11-15 DIAGNOSIS — J984 Other disorders of lung: Secondary | ICD-10-CM | POA: Diagnosis not present

## 2017-11-15 DIAGNOSIS — I10 Essential (primary) hypertension: Secondary | ICD-10-CM | POA: Insufficient documentation

## 2017-11-15 DIAGNOSIS — Z7902 Long term (current) use of antithrombotics/antiplatelets: Secondary | ICD-10-CM | POA: Insufficient documentation

## 2017-11-15 DIAGNOSIS — K2970 Gastritis, unspecified, without bleeding: Secondary | ICD-10-CM

## 2017-11-15 DIAGNOSIS — Z79899 Other long term (current) drug therapy: Secondary | ICD-10-CM | POA: Insufficient documentation

## 2017-11-15 DIAGNOSIS — C541 Malignant neoplasm of endometrium: Secondary | ICD-10-CM | POA: Insufficient documentation

## 2017-11-15 DIAGNOSIS — R079 Chest pain, unspecified: Secondary | ICD-10-CM | POA: Diagnosis not present

## 2017-11-15 DIAGNOSIS — Z7982 Long term (current) use of aspirin: Secondary | ICD-10-CM | POA: Insufficient documentation

## 2017-11-15 DIAGNOSIS — R0602 Shortness of breath: Secondary | ICD-10-CM | POA: Diagnosis not present

## 2017-11-15 DIAGNOSIS — R7989 Other specified abnormal findings of blood chemistry: Secondary | ICD-10-CM | POA: Diagnosis not present

## 2017-11-15 DIAGNOSIS — R0789 Other chest pain: Secondary | ICD-10-CM | POA: Diagnosis not present

## 2017-11-15 LAB — URINALYSIS, ROUTINE W REFLEX MICROSCOPIC
Bilirubin Urine: NEGATIVE
Glucose, UA: NEGATIVE mg/dL
Hgb urine dipstick: NEGATIVE
Ketones, ur: NEGATIVE mg/dL
Leukocytes, UA: NEGATIVE
Nitrite: NEGATIVE
Protein, ur: NEGATIVE mg/dL
Specific Gravity, Urine: 1.012 (ref 1.005–1.030)
pH: 7 (ref 5.0–8.0)

## 2017-11-15 LAB — BASIC METABOLIC PANEL
Anion gap: 9 (ref 5–15)
BUN: 17 mg/dL (ref 8–23)
CO2: 30 mmol/L (ref 22–32)
Calcium: 11.1 mg/dL — ABNORMAL HIGH (ref 8.9–10.3)
Chloride: 101 mmol/L (ref 98–111)
Creatinine, Ser: 0.99 mg/dL (ref 0.44–1.00)
GFR calc Af Amer: >60 mL/min (ref >60)
GFR calc non Af Amer: 57 mL/min — ABNORMAL LOW (ref >60)
Glucose, Bld: 108 mg/dL — ABNORMAL HIGH (ref 70–99)
Potassium: 5 mmol/L (ref 3.5–5.1)
Sodium: 140 mmol/L (ref 135–145)

## 2017-11-15 LAB — D-DIMER, QUANTITATIVE: D-Dimer, Quant: 0.61 ug/mL-FEU — ABNORMAL HIGH (ref 0.00–0.50)

## 2017-11-15 LAB — HEPATIC FUNCTION PANEL
ALT: 17 U/L (ref 0–44)
AST: 24 U/L (ref 15–41)
Albumin: 3.7 g/dL (ref 3.5–5.0)
Alkaline Phosphatase: 89 U/L (ref 38–126)
Bilirubin, Direct: <0.1 mg/dL (ref 0.0–0.2)
Total Bilirubin: 0.6 mg/dL (ref 0.3–1.2)
Total Protein: 6.6 g/dL (ref 6.5–8.1)

## 2017-11-15 LAB — I-STAT TROPONIN, ED
Troponin i, poc: 0 ng/mL (ref 0.00–0.08)
Troponin i, poc: 0 ng/mL (ref 0.00–0.08)

## 2017-11-15 LAB — LIPASE, BLOOD: Lipase: 40 U/L (ref 11–51)

## 2017-11-15 LAB — CBC
HCT: 44 % (ref 36.0–46.0)
Hemoglobin: 14 g/dL (ref 12.0–15.0)
MCH: 31.1 pg (ref 26.0–34.0)
MCHC: 31.8 g/dL (ref 30.0–36.0)
MCV: 97.8 fL (ref 78.0–100.0)
Platelets: 242 10*3/uL (ref 150–400)
RBC: 4.5 MIL/uL (ref 3.87–5.11)
RDW: 13.2 % (ref 11.5–15.5)
WBC: 8.8 10*3/uL (ref 4.0–10.5)

## 2017-11-15 MED ORDER — IOPAMIDOL (ISOVUE-370) INJECTION 76%
100.0000 mL | Freq: Once | INTRAVENOUS | Status: AC | PRN
  Administered 2017-11-15: 100 mL via INTRAVENOUS

## 2017-11-15 MED ORDER — FAMOTIDINE IN NACL 20-0.9 MG/50ML-% IV SOLN
20.0000 mg | Freq: Once | INTRAVENOUS | Status: AC
  Administered 2017-11-15: 20 mg via INTRAVENOUS
  Filled 2017-11-15: qty 50

## 2017-11-15 MED ORDER — OMEPRAZOLE 20 MG PO CPDR: 20.0000 mg | DELAYED_RELEASE_CAPSULE | Freq: Every day | ORAL | 0 refills | Status: DC

## 2017-11-15 MED ORDER — GI COCKTAIL ~~LOC~~
30.0000 mL | Freq: Once | ORAL | Status: AC
Start: 2017-11-15 — End: 2017-11-15
  Administered 2017-11-15: 30 mL via ORAL
  Filled 2017-11-15: qty 30

## 2017-11-15 NOTE — ED Triage Notes (Signed)
Pt presents for evaluation of chest discomfort starting last night. Pt reports went to [facility] and was told to come here. Pt states initially felt like reflux, thought it was related to large amount of tomatoes she had been eating. Pt reports took tums throughout night with no improvement.

## 2017-11-15 NOTE — ED Provider Notes (Signed)
[facility] Provider Note   CSN: [id number] Arrival date & time: 11/15/17  1424     History   Chief Complaint Chief Complaint  Patient presents with  . Chest Pain    HPI Kimberly Larsen is a 72 y.o. female with a past medical history of hypertension, hyperlipidemia, endometrial cancer, chronic pain syndrome, who presents to ED for evaluation of intermittent chest discomfort/indigestion since last night.  She does report increasing the amount of tomatoes that she eats in her diet.  She took Tums last night with no improvement in her symptoms.  States that the symptoms got worse earlier today after she drank a beverage and ate some crackers.  She can feel a large amount of reflux in her chest area.  Reports nausea, shortness of breath and cough productive with mucus.  Denies any hemoptysis, abdominal pain, history of MI, PE, recent surgeries, recent prolonged travel.  She denies any alcohol, tobacco or other drug use.  HPI  Past Medical History:  Diagnosis Date  . Anxiety   . Arthritis   . Back pain   . Cancer [facility])    endometrial cancer  . DDD (degenerative disc disease), lumbar   . Depression   . Gastroparesis   . History of kidney stones   . HLD (hyperlipidemia)   . Hx of sepsis 02/2014   DUE TO MULTIPLE KIDNEY STONES  . Hypertension   . Osteoporosis   . Pneumonia    2014  . UTI (urinary tract infection)     Patient Active Problem List   Diagnosis Date Noted  . Chronic daily headache 10/23/2017  . Pituitary macroadenoma ([place]) 07/23/2017  . UTI (urinary tract infection) 07/10/2017  . Near syncope 07/10/2017  . Lactic acidosis 07/10/2017  . Endometrial cancer ([place]) 01/10/2016  . Severe sepsis with septic shock ([place]) 03/25/2014  . AKI (acute kidney injury) ([place]) 03/25/2014  . Benign essential HTN 03/25/2014  . Obstructive uropathy 03/25/2014  . Hyperlipidemia 03/25/2014  . Acute postoperative respiratory failure ([place])  03/25/2014  . Chronic pain 02/23/2014  . Fibromyalgia 09/08/2012  . Insomnia 09/08/2012  . Obesity (BMI 30-39.9) 09/02/2012  . Expected blood loss anemia 09/02/2012  . S/P right TKA 08/31/2012  . HYPERTENSION, BENIGN SYSTEMIC 06/26/2006  . NEPHROLITHIASIS 06/26/2006    Past Surgical History:  Procedure Laterality Date  . BACK SURGERY  2013  . CARPAL TUNNEL RELEASE     R hand  . CYSTOSCOPY WITH URETEROSCOPY AND STENT PLACEMENT Bilateral 03/25/2014   Procedure: CYSTOSCOPY WITH URETEROSCOPY AND STENT PLACEMENT;  Surgeon: Raynelle Bring, MD;  Location: [facility];  Service: Urology;  Laterality: Bilateral;  . CYSTOSCOPY WITH URETEROSCOPY AND STENT PLACEMENT Bilateral 04/18/2014   Procedure: CYSTOSCOPY WITH URETEROSCOPY AND STENT PLACEMENT,RETROGRADE;  Surgeon: Raynelle Bring, MD;  Location: [facility];  Service: Urology;  Laterality: Bilateral;  . CYSTOSCOPY WITH URETEROSCOPY AND STENT PLACEMENT Right 05/30/2014   Procedure: CYSTOSCOPY WITH URETEROSCOPY AND STENT PLACEMENT;  Surgeon: Raynelle Bring, MD;  Location: [facility];  Service: Urology;  Laterality: Right;  . CYSTOSCOPY/RETROGRADE/URETEROSCOPY Left 05/30/2014   Procedure: LEFT RETROGRADE;  Surgeon: Raynelle Bring, MD;  Location: [facility];  Service: Urology;  Laterality: Left;  . EYE SURGERY     cataract surgery bil  . GANGLION CYST EXCISION     L ankle  . HAMMERTOE RECONSTRUCTION WITH WEIL OSTEOTOMY Left 09/05/2016   Procedure: Second Metatarsal Weil Osteotomy and Hammertoe Correction;  Surgeon: Wylene Simmer, MD;  Location: [facility];  Service: Orthopedics;  Laterality: Left;  . HOLMIUM LASER APPLICATION Bilateral 70/35/0093   Procedure: HOLMIUM LASER APPLICATION;  Surgeon: Raynelle Bring, MD;  Location: [facility];  Service: Urology;  Laterality: Bilateral;  . JOINT REPLACEMENT     total knee  . LITHOTRIPSY    . LUMBAR FUSION  09/2011  . METATARSAL OSTEOTOMY WITH BUNIONECTOMY Left 09/05/2016   Procedure: Left First Metatarsal Scarf  Osteotomy, Modified McBride Bunion Correction;  Surgeon: Wylene Simmer, MD;  Location: [facility];  Service: Orthopedics;  Laterality: Left;  . RHINOPLASTY    . ROBOTIC ASSISTED TOTAL HYSTERECTOMY WITH BILATERAL SALPINGO OOPHERECTOMY Bilateral 01/16/2016   Procedure: XI ROBOTIC ASSISTED TOTAL HYSTERECTOMY WITH BILATERAL SALPINGO OOPHORECTOMY AND BILATERAL PELVIC LYMPH NODE DISSECTION;  Surgeon: Everitt Amber, MD;  Location: [facility];  Service: Gynecology;  Laterality: Bilateral;  . SPINAL CORD STIMULATOR INSERTION N/A 02/23/2014   Procedure: SPINAL CORD STIMULATOR PLACEMENT ;  Surgeon: Melina Schools, MD;  Location: [facility];  Service: Orthopedics;  Laterality: N/A;  . TONSILLECTOMY    . TOTAL KNEE ARTHROPLASTY Right 08/31/2012   Procedure: RIGHT TOTAL KNEE ARTHROPLASTY;  Surgeon: Mauri Pole, MD;  Location[facility];  Service: Orthopedics;  Laterality: Right;  with LMA  . TUBAL LIGATION       OB History   None      Home Medications    Prior to Admission medications   Medication Sig Start Date End Date Taking? Authorizing Provider  ALPRAZolam (XANAX) 0.25 MG tablet Take 0.25 mg by mouth 3 (three) times daily as needed for anxiety.  05/30/17  Yes [provider]  aspirin EC 81 MG tablet Take 81 mg by mouth daily.   Yes [provider]  B Complex Vitamins (B COMPLEX PO) Take 1 tablet by mouth daily.   Yes [provider]  cabergoline (DOSTINEX) 0.5 MG tablet Take 1 mg by mouth 2 (two) times a week.   Yes [provider]  docusate sodium (COLACE) 100 MG capsule Take 1 capsule (100 mg total) by mouth 2 (two) times daily. While taking narcotic pain medicine. Patient taking differently: Take 100 mg by mouth See admin instructions. Take 100 mg by mouth at bedtime and may take an additional 100 mg once a day as needed if still needed for constipation 09/05/16  Yes Corky Sing, PA-C  DULoxetine (CYMBALTA) 60 MG capsule Take 60 mg by mouth daily.   Yes  [provider]  fluticasone (FLONASE) 50 MCG/ACT nasal spray Place 1 spray into both nostrils daily.   Yes [provider]  gabapentin (NEURONTIN) 600 MG tablet Take 600 mg by mouth 3 (three) times daily.   Yes [provider]  levothyroxine (SYNTHROID, LEVOTHROID) 50 MCG tablet Take 50 mcg by mouth daily before breakfast.  10/12/17  Yes [provider]  Magnesium 250 MG TABS Take 250 mg by mouth daily.    Yes [provider]  Melatonin 10 MG CAPS Take 10 mg by mouth at bedtime.   Yes [provider]  metaxalone (SKELAXIN) 800 MG tablet Take 400 mg by mouth 3 (three) times daily as needed (muscle pain or spasms).  10/16/17  Yes [provider]  metoprolol succinate (TOPROL-XL) 25 MG 24 hr tablet Take 25 mg by mouth daily.  06/15/14  Yes [provider]  montelukast (SINGULAIR) 10 MG tablet Take 10 mg by mouth at bedtime.  10/06/17  Yes [provider]  Multiple Vitamin (MULTIVITAMIN WITH MINERALS) TABS Take 1 tablet  by mouth daily. 09/11/12  Yes Judeth Cornfield, NP  Multiple Vitamins-Minerals (HAIR SKIN AND NAILS FORMULA PO) Take 1 capsule by mouth daily.    Yes [provider]  naloxone (NARCAN) nasal spray 4 mg/0.1 mL Place 1 spray into the nose See admin instructions. Take by nasal route every 3 minutes until patient awakes or EMS arrives.   Yes [provider]  Omega-3 Fatty Acids (FISH OIL) 600 MG CAPS Take 1 capsule by mouth 2 (two) times daily. Patient taking differently: Take 600 mg by mouth 2 (two) times daily.  09/11/12  Yes Judeth Cornfield, NP  oxyCODONE-acetaminophen (PERCOCET) 10-325 MG tablet Take 1 tablet by mouth 3 (three) times daily as needed for pain. 06/30/17  Yes [provider]  pravastatin (PRAVACHOL) 40 MG tablet Take 1 tablet (40 mg total) by mouth at bedtime. 09/11/12  Yes Judeth Cornfield, NP  Propylene Glycol (SYSTANE BALANCE) 0.6 % SOLN Place 1-2 drops into both eyes 4 (four)  times daily.    Yes [provider]  traZODone (DESYREL) 100 MG tablet Take 100 mg by mouth at bedtime.   Yes [provider]  alendronate (FOSAMAX) 70 MG tablet Take 70 mg by mouth once a week. 06/16/17   [provider]  omeprazole (PRILOSEC) 20 MG capsule Take 1 capsule (20 mg total) by mouth daily. 11/15/17   Britt Theard, PA-C  ondansetron (ZOFRAN) 4 MG tablet Take 1 tablet by mouth every 6 (six) hours as needed for nausea or vomiting.     [provider]  polyethylene glycol (MIRALAX / GLYCOLAX) packet Take 17 g by mouth 2 (two) times daily. Patient taking differently: Take 17 g by mouth 2 (two) times daily as needed for mild constipation.  07/12/17   Kerney Elbe, DO    Family History Family History  Problem Relation Age of Onset  . Stroke Mother   . Emphysema Mother   . COPD Mother   . Stroke Father   . Heart disease Father   . Emphysema Father     Social History Social History   Tobacco Use  . Smoking status: Never Smoker  . Smokeless tobacco: Never Used  Substance Use Topics  . Alcohol use: No  . Drug use: No     Allergies   Augmentin [amoxicillin-pot clavulanate] and Wellbutrin [bupropion]   Review of Systems Review of Systems  Constitutional: Negative for appetite change, chills and fever.  HENT: Negative for ear pain, rhinorrhea, sneezing and sore throat.   Eyes: Negative for photophobia and visual disturbance.  Respiratory: Positive for cough and shortness of breath. Negative for chest tightness and wheezing.   Cardiovascular: Negative for chest pain and palpitations.  Gastrointestinal: Positive for nausea. Negative for abdominal pain, blood in stool, constipation, diarrhea and vomiting.       + indigestion  Genitourinary: Negative for dysuria, hematuria and urgency.  Musculoskeletal: Negative for myalgias.  Skin: Negative for rash.  Neurological: Negative for dizziness, weakness and light-headedness.      Physical Exam Updated Vital Signs BP (!) 184/80   Pulse 66   Temp 98 F (36.7 C) (Oral)   Resp 16   SpO2 95%   Physical Exam  Constitutional: She appears well-developed and well-nourished. No distress.  HENT:  Head: Normocephalic and atraumatic.  Nose: Nose normal.  Eyes: Conjunctivae and EOM are normal. Left eye exhibits no discharge. No scleral icterus.  Neck: Normal range of motion. Neck supple.  Cardiovascular: Normal rate, regular rhythm, normal heart sounds  and intact distal pulses. Exam reveals no gallop and no friction rub.  No murmur heard. Pulmonary/Chest: Effort normal and breath sounds normal. No respiratory distress.  Abdominal: Soft. Bowel sounds are normal. She exhibits no distension. There is no tenderness. There is no guarding.  Musculoskeletal: Normal range of motion. She exhibits no edema.  No lower extremity edema, erythema or calf tenderness bilaterally.  Neurological: She is alert. She exhibits normal muscle tone. Coordination normal.  Skin: Skin is warm and dry. No rash noted.  Psychiatric: She has a normal mood and affect.  Nursing note and vitals reviewed.    ED Treatments / Results  Labs (all labs ordered are listed, but only abnormal results are displayed) Labs Reviewed  BASIC METABOLIC PANEL - Abnormal; Notable for the following components:      Result Value   Glucose, Bld 108 (*)    Calcium 11.1 (*)    GFR calc non Af Amer 57 (*)    All other components within normal limits  URINALYSIS, ROUTINE W REFLEX MICROSCOPIC - Abnormal; Notable for the following components:   APPearance CLOUDY (*)    All other components within normal limits  D-DIMER, QUANTITATIVE (NOT AT [facility]) - Abnormal; Notable for the following components:   D-Dimer, Quant 0.61 (*)    All other components within normal limits  URINE CULTURE  CBC  LIPASE, BLOOD  HEPATIC FUNCTION PANEL  I-STAT TROPONIN, ED  I-STAT TROPONIN, ED    EKG EKG  Interpretation  Date/Time:  Saturday November 15 2017 14:31:23 EDT Ventricular Rate:  67 PR Interval:  426 QRS Duration: 68 QT Interval:  374 QTC Calculation: 395 R Axis:   33 Text Interpretation:  Sinus rhythm Abnormal ECG Confirmed by Davonna Belling [id number]) on 11/15/2017 3:16:36 PM   Radiology Dg Chest 2 View  Result Date: 11/15/2017 CLINICAL DATA:  Left-sided heaviness. EXAM: CHEST - 2 VIEW COMPARISON:  07/10/2017 and 05/15/2017 FINDINGS: Lungs are adequately inflated with linear scarring over the left base. Mild undulation of the medial aspect of the right hemidiaphragm unchanged. This is not well demonstrated on the lateral view. Cardiomediastinal silhouette and remainder of the exam is unchanged. IMPRESSION: No acute findings. Electronically Signed   By: Marin Olp M.D.   On: 11/15/2017 15:15   Ct Angio Chest Pe W/cm &/or Wo Cm  Result Date: 11/15/2017 CLINICAL DATA:  Chest pain. Pulmonary thromboembolism suspected. Intermediate probability. Positive D-dimer. EXAM: CT ANGIOGRAPHY CHEST WITH CONTRAST TECHNIQUE: Multidetector CT imaging of the chest was performed using the standard protocol during bolus administration of intravenous contrast. Multiplanar CT image reconstructions and MIPs were obtained to evaluate the vascular anatomy. CONTRAST:  161mL ISOVUE-370 IOPAMIDOL (ISOVUE-370) INJECTION 76% COMPARISON:  Chest CT 05/16/2017 FINDINGS: Cardiovascular: No filling defects within the pulmonary arteries to suggest acute pulmonary embolism. No acute findings of the aorta or great vessels. No pericardial fluid. Mediastinum/Nodes: No axillary supraclavicular adenopathy. No mediastinal hilar adenopathy. No pericardial effusion. Esophagus normal. Small esophageal diverticulum just above the GE junction measures 12 mm (image 108/5). Lungs/Pleura: No pulmonary infarction. No pulmonary edema. No infiltrate. No bronchiectasis. Mild peribronchial thickening in the left lower lobe. Upper Abdomen:  Limited view of the liver, kidneys, pancreas are unremarkable. Normal adrenal glands. Musculoskeletal: Degenerative osteophytosis of the spine. Spinal stimulator device in the thoracic spine central canal Review of the MIP images confirms the above findings. IMPRESSION: 1. No evidence acute pulmonary embolism. 2. Mild bronchial thickening in the lower lobes suggests mild bronchitis. 3. Small esophageal diverticulum. Electronically Signed  By: Suzy Bouchard M.D.   On: 11/15/2017 19:32    Procedures Procedures (including critical care time)  Medications Ordered in ED Medications  gi cocktail (Maalox,Lidocaine,Donnatal) (30 mLs Oral Given 11/15/17 1623)  famotidine (PEPCID) IVPB 20 mg premix (0 mg Intravenous Stopped 11/15/17 1832)  iopamidol (ISOVUE-370) 76 % injection 100 mL (100 mLs Intravenous Contrast Given 11/15/17 1833)     Initial Impression / Assessment and Plan / ED Course  I have reviewed the triage vital signs and the nursing notes.  Pertinent labs & imaging results that were available during my care of the patient were reviewed by me and considered in my medical decision making (see chart for details).     72 year old female with past medical history of hypertension, hyperlipidemia, endometrial cancer, chronic pain syndrome presents for evaluation of intermittent chest discomfort/indigestion since last night.  Reports increasing the amount of tomatoes that she eats in her diet.  No improvement with Tums.  Symptoms got worse earlier today after eating and drinking.  Reports nausea, shortness of breath and cough productive with mucus.  Denies any alcohol, tobacco or other drug use.  On physical exam she is overall well-appearing.  She is not tachycardic, tachypneic or hypoxic.  No lower extremity edema or calf tenderness bilaterally.  EKG shows sinus rhythm.  Initial and delta troponin both negative.  CBC, CMP, lipase unremarkable.  Urine negative.  Chest x-ray unremarkable.  D-dimer  elevated 2.61.  CT of the chest is negative for acute abnormality.  Patient given GI cocktail, Pepcid with complete resolution of her symptoms.  States that she feels much better than when she entered the ED.  Vital signs remain stable.  She is comfortable with discharge home with trial of PPI.  Advised to follow-up with PCP and to return to ED for any severe worsening symptoms. Patient discussed with and seen by my attending, Dr. Alvino Chapel.  Portions of this note were generated with Lobby[employer]. Dictation errors may occur despite best attempts at proofreading.   Final Clinical Impressions(s) / ED Diagnoses   Final diagnoses:  Gastritis without bleeding, unspecified chronicity, unspecified gastritis type    ED Discharge Orders        Ordered    omeprazole (PRILOSEC) 20 MG capsule  Daily     11/15/17 2004       Delia Heady, Hershal Coria 11/15/17 Jonelle Sports, MD 11/16/17 [phone]

## 2017-11-15 NOTE — Discharge Instructions (Signed)
Return to ED for worsening symptoms including severe chest pain or shortness of breath, vomiting up blood, coughing up blood, lightheadedness or loss of consciousness.

## 2017-11-15 NOTE — ED Notes (Signed)
Patient Alert and oriented to baseline. Stable and ambulatory to baseline. Patient verbalized understanding of the discharge instructions.  Patient belongings were taken by the patient.   

## 2017-11-15 NOTE — ED Notes (Signed)
Iv team at bedside  

## 2017-11-17 LAB — URINE CULTURE: Culture: 40000 — AB

## 2017-11-18 ENCOUNTER — Telehealth: Payer: Self-pay | Admitting: Emergency Medicine

## 2017-11-18 NOTE — Telephone Encounter (Signed)
Post ED Visit - Positive Culture Follow-up  Culture report reviewed by antimicrobial stewardship pharmacist:  []  Elenor Quinones, Pharm.D. []  Heide Guile, Pharm.D., BCPS AQ-ID []  Parks Neptune, Pharm.D., BCPS []  Alycia Rossetti, Pharm.D., BCPS []  [place], [place].D., BCPS, AAHIVP []  Legrand Como, Pharm.D., BCPS, AAHIVP [x]  Salome Arnt, PharmD, BCPS []  Johnnette Gourd, PharmD, BCPS []  Hughes Better, PharmD, BCPS []  Leeroy Cha, PharmD  Positive urine culture Treated with none, asymptomatic, no further patient follow-up is required at this time.  Hazle Nordmann 11/18/2017, 11:34 AM

## 2017-11-20 DIAGNOSIS — Z1231 Encounter for screening mammogram for malignant neoplasm of breast: Secondary | ICD-10-CM | POA: Diagnosis not present

## 2017-11-25 DIAGNOSIS — M6289 Other specified disorders of muscle: Secondary | ICD-10-CM | POA: Diagnosis not present

## 2017-11-25 DIAGNOSIS — Z1389 Encounter for screening for other disorder: Secondary | ICD-10-CM | POA: Diagnosis not present

## 2017-11-25 DIAGNOSIS — L299 Pruritus, unspecified: Secondary | ICD-10-CM | POA: Diagnosis not present

## 2017-11-25 DIAGNOSIS — Z79899 Other long term (current) drug therapy: Secondary | ICD-10-CM | POA: Diagnosis not present

## 2017-11-25 DIAGNOSIS — Z0000 Encounter for general adult medical examination without abnormal findings: Secondary | ICD-10-CM | POA: Diagnosis not present

## 2017-11-25 DIAGNOSIS — E785 Hyperlipidemia, unspecified: Secondary | ICD-10-CM | POA: Diagnosis not present

## 2017-11-25 DIAGNOSIS — Z1331 Encounter for screening for depression: Secondary | ICD-10-CM | POA: Diagnosis not present

## 2017-11-25 DIAGNOSIS — R7303 Prediabetes: Secondary | ICD-10-CM | POA: Diagnosis not present

## 2017-11-26 DIAGNOSIS — H04123 Dry eye syndrome of bilateral lacrimal glands: Secondary | ICD-10-CM | POA: Diagnosis not present

## 2017-11-26 DIAGNOSIS — H25813 Combined forms of age-related cataract, bilateral: Secondary | ICD-10-CM | POA: Diagnosis not present

## 2017-12-03 DIAGNOSIS — T85698D Other mechanical complication of other specified internal prosthetic devices, implants and grafts, subsequent encounter: Secondary | ICD-10-CM | POA: Diagnosis not present

## 2017-12-03 DIAGNOSIS — M545 Low back pain: Secondary | ICD-10-CM | POA: Diagnosis not present

## 2017-12-03 DIAGNOSIS — M961 Postlaminectomy syndrome, not elsewhere classified: Secondary | ICD-10-CM | POA: Diagnosis not present

## 2017-12-16 ENCOUNTER — Telehealth: Payer: Self-pay | Admitting: Genetics

## 2017-12-16 DIAGNOSIS — M961 Postlaminectomy syndrome, not elsewhere classified: Secondary | ICD-10-CM | POA: Diagnosis not present

## 2017-12-16 DIAGNOSIS — M545 Low back pain: Secondary | ICD-10-CM | POA: Diagnosis not present

## 2017-12-17 ENCOUNTER — Ambulatory Visit: Payer: Self-pay | Admitting: Genetics

## 2017-12-17 ENCOUNTER — Encounter: Payer: Self-pay | Admitting: Genetics

## 2017-12-17 DIAGNOSIS — Z1379 Encounter for other screening for genetic and chromosomal anomalies: Secondary | ICD-10-CM

## 2017-12-17 DIAGNOSIS — C541 Malignant neoplasm of endometrium: Secondary | ICD-10-CM

## 2017-12-17 HISTORY — DX: Encounter for other screening for genetic and chromosomal anomalies: Z13.79

## 2017-12-17 NOTE — Progress Notes (Signed)
HPI:  Kimberly Larsen was previously seen in the [facility] clinic on 10/28/2017 due to a personal history of uterine cancer that had abnormal ICH staining, and concerns regarding a hereditary predisposition to cancer. Please refer to our prior cancer genetics clinic note for more information regarding Kimberly Larsen's medical, social and family histories, and our assessment and recommendations, at the time. [name] recent genetic test results were disclosed to her, as well as recommendations warranted by these results. These results and recommendations are discussed in more detail below.  CANCER HISTORY:    Endometrial cancer ([place])   12/29/2015 Initial Diagnosis    Endometrial cancer [facility])     Genetic Testing    Patient has genetic testing done for mismatch repair protien. Results revealed patient has the following mutation(s): Abnormal: MSH6: loss of nuclear expression (less than 5% tumor expression).   In 2017, at the age of 32, Kimberly Larsen was diagnosed with endometrial adenocarcinoma.  She underwent hysterectomy with BSO.  She has more recently been diagnosed with a pituitary macroadenoma and is being followed by Dr. Mickeal Skinner.  She had IHC testing performed on her endometrial cancer that was abnormal and revealed a loss of MSH6.   FAMILY HISTORY:  We obtained a detailed, 4-generation family history.  Significant diagnoses are listed below: Family History  Problem Relation Age of Onset  . Stroke Mother   . Emphysema Mother   . COPD Mother   . Stroke Father   . Heart disease Father   . Emphysema Father    Kimberly Larsen has a 44 year-old son with no history of cancer. Kimberly Larsen has a 10 year-old brother who has no history of cancer, but has heart disease.   Kimberly Larsen father: died at 76 due to heart disease, emphysema, CHD Paternal Aunts/Uncles: 2 paternal aunts, 2 paternal uncles. Deceased, limited information about these reltaives.  Paternal cousins: limited info/unk, most are still  living Paternal grandfather: deceased, unk cause Paternal grandmother:died in her 60's unk cause  Kimberly Larsen's mother: died at 15 due to strokes.  She also had COPD and Emphysema.  She had a DNC and hysterectomy in her 6's.  Maternal Aunts/Uncles: 2 maternal aunts, 4 maternal uncles, and 3 maternal half auntsa nd 3 maternal half uncles.  Limited information available about these relatives.  Maternal cousins: no known history of cancer, limited info Maternal grandfather: died in his 9's, unk cause Maternal grandmother:died in her 73's unk cause  Kimberly Larsen is unaware of previous family history of genetic testing for hereditary cancer risks. Patient's maternal ancestors are of Northern European/[place] descent, and paternal ancestors are of Northern European descent. There is no reported Ashkenazi Jewish ancestry. There is no known consanguinity.  GENETIC TEST RESULTS: Genetic testing performed through [facility] CancerNext Panel was reported out on 12/15/2017.    Germline results: Negaitve, no pathogenic variants identified.    The CancerNext gene panel offered by [facility] [employer] includes sequencing and rearrangement analysis for the following 32 genes:   APC, ATM, BARD1, BMPR1A, BRCA1, BRCA2, BRIP1, CDH1, CDK4, CDKN2A, CHEK2, DICER1, EPCAM, GREM1, HOXB13MLH1, MRE11A, MSH2, MSH6, MUTYH, NBN, NF1, PALB2, PMS2, POLD1, POLE, PTEN, RAD50, RAD51D, SMAD4, SMARCA4, STK11, and TP53.   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  A variant of uncertain significance in the gene MSH6 was also identified [id number].   Microsatellite Instability: MSI High MLH1 Promoter Hypermehtylation: Absent    ADDITIONAL GENETIC TESTING: We discussed with Kimberly Larsen  that there are other genes that are associated with increased cancer risk that can be analyzed. The laboratories that offer this testing look at these additional genes via a hereditary cancer gene panel.  Should Kimberly Larsen wish to pursue additional genetic testing, we are happy to discuss and coordinate this testing, at any time.    CANCER SCREENING RECOMMENDATIONS: Ms. Domke test results are most consistent with a sporadic cause for her uterine cancer and previously abnormal MMR IHC results.  Lynch syndrome is unlikely, and no other hereditary cancer risks were identified in the genes analyzed.   While reassuring, this does not definitively rule out a hereditary predisposition to cancer. It is still possible that there could be genetic mutations that are undetectable by current technology, or genetic mutations in genes that have not been tested or identified to increase cancer risk.  Therefore, it is recommended she continue to follow the cancer management and screening guidelines provided by her oncology and primary healthcare provider. An individual's cancer risk is not determined by genetic test results alone.  Overall cancer risk assessment includes additional factors such as personal medical history, family history, etc.  These should be used to make a personalized plan for cancer prevention and surveillance.    RECOMMENDATIONS FOR FAMILY MEMBERS:  Relatives in this family might be at some increased risk of developing cancer, over the general population risk, simply due to the family history of cancer.  We recommended women in this family have a yearly mammogram beginning at age 34, or 35 years younger than the earliest onset of cancer, an annual clinical breast exam, and perform monthly breast self-exams. Women in this family should also have a gynecological exam as recommended by their primary provider. All family members should have a colonoscopy by age 83 (or as directed by their doctors).  All family members should inform their physicians about the family history of cancer so their doctors can make the most appropriate screening recommendations for them.   FOLLOW-UP: Lastly, we discussed with Ms.  Larsen that cancer genetics is a rapidly advancing field and it is possible that new genetic tests will be appropriate for her and/or her family members in the future. We encouraged her to remain in contact with cancer genetics on an annual basis so we can update her personal and family histories and let her know of advances in cancer genetics that may benefit this family.   Our contact number was provided. Ms. Ende questions were answered to her satisfaction, and she knows she is welcome to call us at anytime with additional questions or concerns.   Ferol Luz, MS, [facility] Certified Genetic Counselor [email or web address]

## 2017-12-17 NOTE — Telephone Encounter (Signed)
Revealed genetic test results: Germline results were negative.   This normal result is reassuring and indicates that it is unlikely [name]'s cancer is due to a hereditary cause.  It is unlikely that there is an increased risk of another cancer due to a mutation in one of these genes.  However, genetic testing is not perfect, and cannot definitively rule out a hereditary cause.  It will be important for her to keep in contact with genetics to learn if any additional testing may be needed in the future.     Tumor testing results: 1 somatic pathogenic variant and 1 somatic variant of uncertain significance were identified in MSH6.  This mutation in MSH6 may be contributing to the absent MSH6 protein on IHC.   These results mean Lynch Syndrome is unlikely and no other hereditary predisposition to cancer was identified.  Kimberly Larsen should continue following her provider's recommendations regarding cancer screening and management.

## 2017-12-19 DIAGNOSIS — D352 Benign neoplasm of pituitary gland: Secondary | ICD-10-CM | POA: Diagnosis not present

## 2017-12-19 DIAGNOSIS — E038 Other specified hypothyroidism: Secondary | ICD-10-CM | POA: Diagnosis not present

## 2017-12-19 DIAGNOSIS — E221 Hyperprolactinemia: Secondary | ICD-10-CM | POA: Diagnosis not present

## 2018-01-01 MED ORDER — FULVESTRANT 250 MG/5ML IM SOLN
INTRAMUSCULAR | Status: AC
  Filled 2018-01-01: qty 10

## 2018-01-08 DIAGNOSIS — M1712 Unilateral primary osteoarthritis, left knee: Secondary | ICD-10-CM | POA: Diagnosis not present

## 2018-01-08 DIAGNOSIS — M25562 Pain in left knee: Secondary | ICD-10-CM | POA: Diagnosis not present

## 2018-01-08 DIAGNOSIS — Z96651 Presence of right artificial knee joint: Secondary | ICD-10-CM | POA: Diagnosis not present

## 2018-01-08 HISTORY — DX: Pain in left knee: M25.562

## 2018-01-15 ENCOUNTER — Other Ambulatory Visit: Payer: Self-pay | Admitting: *Deleted

## 2018-01-15 DIAGNOSIS — D352 Benign neoplasm of pituitary gland: Secondary | ICD-10-CM

## 2018-01-16 DIAGNOSIS — D352 Benign neoplasm of pituitary gland: Secondary | ICD-10-CM | POA: Diagnosis not present

## 2018-01-16 DIAGNOSIS — E221 Hyperprolactinemia: Secondary | ICD-10-CM | POA: Diagnosis not present

## 2018-02-02 DIAGNOSIS — Z23 Encounter for immunization: Secondary | ICD-10-CM | POA: Diagnosis not present

## 2018-02-18 MED ORDER — ONDANSETRON HCL 4 MG/2ML IJ SOLN
INTRAMUSCULAR | Status: AC
  Filled 2018-02-18: qty 2

## 2018-02-19 ENCOUNTER — Other Ambulatory Visit: Payer: Self-pay | Admitting: Radiation Therapy

## 2018-02-20 ENCOUNTER — Inpatient Hospital Stay: Payer: Medicare Other | Attending: Internal Medicine

## 2018-02-20 ENCOUNTER — Ambulatory Visit (HOSPITAL_COMMUNITY)
Admission: RE | Admit: 2018-02-20 | Discharge: 2018-02-20 | Disposition: A | Discharge status: Home / Self Care | Payer: Medicare Other | Source: Ambulatory Visit | Attending: Internal Medicine | Admitting: Internal Medicine

## 2018-02-20 ENCOUNTER — Ambulatory Visit (HOSPITAL_COMMUNITY): Payer: Medicare Other

## 2018-02-20 DIAGNOSIS — Z79899 Other long term (current) drug therapy: Secondary | ICD-10-CM | POA: Diagnosis not present

## 2018-02-20 DIAGNOSIS — D352 Benign neoplasm of pituitary gland: Secondary | ICD-10-CM | POA: Diagnosis not present

## 2018-02-20 DIAGNOSIS — M545 Low back pain: Secondary | ICD-10-CM | POA: Diagnosis not present

## 2018-02-20 DIAGNOSIS — R51 Headache: Secondary | ICD-10-CM | POA: Insufficient documentation

## 2018-02-20 DIAGNOSIS — G8929 Other chronic pain: Secondary | ICD-10-CM | POA: Insufficient documentation

## 2018-02-20 LAB — CREATININE (CANCER CENTER ONLY)
Creatinine: 0.94 mg/dL (ref 0.44–1.00)
GFR, Est AFR Am: >60 mL/min (ref >60)
GFR, Estimated: >60 mL/min (ref >60)

## 2018-02-20 MED ORDER — IOHEXOL 300 MG/ML  SOLN
75.0000 mL | Freq: Once | INTRAMUSCULAR | Status: AC | PRN
  Administered 2018-02-20: 75 mL via INTRAVENOUS

## 2018-02-20 MED ORDER — SODIUM CHLORIDE 0.9 % IJ SOLN
INTRAMUSCULAR | Status: AC
  Filled 2018-02-20: qty 50

## 2018-02-23 ENCOUNTER — Telehealth: Payer: Self-pay | Admitting: Internal Medicine

## 2018-02-23 ENCOUNTER — Inpatient Hospital Stay (HOSPITAL_BASED_OUTPATIENT_CLINIC_OR_DEPARTMENT_OTHER): Payer: Medicare Other | Admitting: Internal Medicine

## 2018-02-23 VITALS — BP 135/65 | HR 65 | Temp 98.2°F | Resp 18 | Wt 202.0 lb

## 2018-02-23 DIAGNOSIS — G8929 Other chronic pain: Secondary | ICD-10-CM | POA: Diagnosis not present

## 2018-02-23 DIAGNOSIS — Z79899 Other long term (current) drug therapy: Secondary | ICD-10-CM | POA: Diagnosis not present

## 2018-02-23 DIAGNOSIS — D352 Benign neoplasm of pituitary gland: Secondary | ICD-10-CM

## 2018-02-23 DIAGNOSIS — M545 Low back pain: Secondary | ICD-10-CM

## 2018-02-23 DIAGNOSIS — R51 Headache: Secondary | ICD-10-CM | POA: Diagnosis not present

## 2018-02-23 NOTE — Telephone Encounter (Signed)
Gave pt avs and calendar  °

## 2018-02-23 NOTE — Progress Notes (Signed)
[facility] [place], [place] [address] [phone]   Interval Evaluation  Date of Service: 02/23/18 Patient Name: Kimberly Larsen Patient MRN: [id number] Patient DOB: 1945-07-30 Provider: Ventura Sellers, MD  Identifying Statement:  Kimberly Larsen is a 72 y.o. 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    Referring Provider: Ernestene Kiel, MD [address] [place], [place] [address]  Interval History:  Kimberly Larsen presents today after recent MRI brain.  She has been taking cabergoline since our referral to endcorinology for her prolactinoma.  Daily headaches have improved though she continues to take oxycodone 2x per day due to chronic back pain.  Medications: Current Outpatient Medications on File Prior to Visit  Medication Sig Dispense Refill  . alendronate (FOSAMAX) 70 MG tablet Take 70 mg by mouth once a week.    . ALPRAZolam (XANAX) 0.25 MG tablet Take 0.25 mg by mouth 3 (three) times daily as needed for anxiety.     Marland Kitchen aspirin EC 81 MG tablet Take 81 mg by mouth daily.    . B Complex Vitamins (B COMPLEX PO) Take 1 tablet by mouth daily.    . cabergoline (DOSTINEX) 0.5 MG tablet Take 1 mg by mouth 2 (two) times a week.    . docusate sodium (COLACE) 100 MG capsule Take 1 capsule (100 mg total) by mouth 2 (two) times daily. While taking narcotic pain medicine. (Patient taking differently: Take 100 mg by mouth See admin instructions. Take 100 mg by mouth at bedtime and may take an additional 100 mg once a day as needed if still needed for constipation) 30 capsule 0  . DULoxetine (CYMBALTA) 60 MG capsule Take 60 mg by mouth daily.    . fluticasone (FLONASE) 50 MCG/ACT nasal spray Place 1 spray into both nostrils daily.    Marland Kitchen gabapentin (NEURONTIN) 600 MG tablet Take 600 mg by mouth 3 (three) times daily.    Marland Kitchen levothyroxine (SYNTHROID, LEVOTHROID) 50 MCG tablet Take 50 mcg by mouth daily  before breakfast.   5  . Magnesium 250 MG TABS Take 250 mg by mouth daily.     . Melatonin 10 MG CAPS Take 10 mg by mouth at bedtime.    . metaxalone (SKELAXIN) 800 MG tablet Take 400 mg by mouth 3 (three) times daily as needed (muscle pain or spasms).   2  . metoprolol succinate (TOPROL-XL) 25 MG 24 hr tablet Take 25 mg by mouth daily.   0  . montelukast (SINGULAIR) 10 MG tablet Take 10 mg by mouth at bedtime.   1  . Multiple Vitamin (MULTIVITAMIN WITH MINERALS) TABS Take 1 tablet by mouth daily.    . Multiple Vitamins-Minerals (HAIR SKIN AND NAILS FORMULA PO) Take 1 capsule by mouth daily.     . naloxone (NARCAN) nasal spray 4 mg/0.1 mL Place 1 spray into the nose See admin instructions. Take by nasal route every 3 minutes until patient awakes or EMS arrives.    . Omega-3 Fatty Acids (FISH OIL) 600 MG CAPS Take 1 capsule by mouth 2 (two) times daily. (Patient taking differently: Take 600 mg by mouth 2 (two) times daily. ) 30 capsule   . omeprazole (PRILOSEC) 20 MG capsule Take 1 capsule (20 mg total) by mouth daily. 30 capsule 0  . ondansetron (ZOFRAN) 4 MG tablet Take 1 tablet by mouth every 6 (six) hours as needed for nausea or vomiting.     Marland Kitchen  oxyCODONE-acetaminophen (PERCOCET) 10-325 MG tablet Take 1 tablet by mouth 3 (three) times daily as needed for pain.    . polyethylene glycol (MIRALAX / GLYCOLAX) packet Take 17 g by mouth 2 (two) times daily. (Patient taking differently: Take 17 g by mouth 2 (two) times daily as needed for mild constipation. ) 14 each 0  . pravastatin (PRAVACHOL) 40 MG tablet Take 1 tablet (40 mg total) by mouth at bedtime. 30 tablet 0  . Propylene Glycol (SYSTANE BALANCE) 0.6 % SOLN Place 1-2 drops into both eyes 4 (four) times daily.     . traZODone (DESYREL) 100 MG tablet Take 100 mg by mouth at bedtime.     No current facility-administered medications on file prior to visit.     Allergies:  Allergies  Allergen Reactions  . Augmentin [Amoxicillin-Pot  Clavulanate] Nausea Only  . Wellbutrin [Bupropion] Nausea Only   Past Medical History:  Past Medical History:  Diagnosis Date  . Anxiety   . Arthritis   . Back pain   . Cancer [facility])    endometrial cancer  . DDD (degenerative disc disease), lumbar   . Depression   . Gastroparesis   . History of kidney stones   . HLD (hyperlipidemia)   . Hx of sepsis 02/2014   DUE TO MULTIPLE KIDNEY STONES  . Hypertension   . Osteoporosis   . Pneumonia    2014  . UTI (urinary tract infection)    Past Surgical History:  Past Surgical History:  Procedure Laterality Date  . BACK SURGERY  2013  . CARPAL TUNNEL RELEASE     R hand  . CYSTOSCOPY WITH URETEROSCOPY AND STENT PLACEMENT Bilateral 03/25/2014   Procedure: CYSTOSCOPY WITH URETEROSCOPY AND STENT PLACEMENT;  Surgeon: Raynelle Bring, MD;  Location: [facility];  Service: Urology;  Laterality: Bilateral;  . CYSTOSCOPY WITH URETEROSCOPY AND STENT PLACEMENT Bilateral 04/18/2014   Procedure: CYSTOSCOPY WITH URETEROSCOPY AND STENT PLACEMENT,RETROGRADE;  Surgeon: Raynelle Bring, MD;  Location: [facility];  Service: Urology;  Laterality: Bilateral;  . CYSTOSCOPY WITH URETEROSCOPY AND STENT PLACEMENT Right 05/30/2014   Procedure: CYSTOSCOPY WITH URETEROSCOPY AND STENT PLACEMENT;  Surgeon: Raynelle Bring, MD;  Location[facility];  Service: Urology;  Laterality: Right;  . CYSTOSCOPY/RETROGRADE/URETEROSCOPY Left 05/30/2014   Procedure: LEFT RETROGRADE;  Surgeon: Raynelle Bring, MD;  Location: [facility];  Service: Urology;  Laterality: Left;  . EYE SURGERY     cataract surgery bil  . GANGLION CYST EXCISION     L ankle  . HAMMERTOE RECONSTRUCTION WITH WEIL OSTEOTOMY Left 09/05/2016   Procedure: Second Metatarsal Weil Osteotomy and Hammertoe Correction;  Surgeon: Wylene Simmer, MD;  Location: [place];  Service: Orthopedics;  Laterality: Left;  . HOLMIUM LASER APPLICATION Bilateral 27/78/2423   Procedure: HOLMIUM LASER APPLICATION;  Surgeon: Raynelle Bring, MD;   Location[facility];  Service: Urology;  Laterality: Bilateral;  . JOINT REPLACEMENT     total knee  . LITHOTRIPSY    . LUMBAR FUSION  09/2011  . METATARSAL OSTEOTOMY WITH BUNIONECTOMY Left 09/05/2016   Procedure: Left First Metatarsal Scarf Osteotomy, Modified McBride Bunion Correction;  Surgeon: Wylene Simmer, MD;  Location: [place];  Service: Orthopedics;  Laterality: Left;  . RHINOPLASTY    . ROBOTIC ASSISTED TOTAL HYSTERECTOMY WITH BILATERAL SALPINGO OOPHERECTOMY Bilateral 01/16/2016   Procedure: XI ROBOTIC ASSISTED TOTAL HYSTERECTOMY WITH BILATERAL SALPINGO OOPHORECTOMY AND BILATERAL PELVIC LYMPH NODE DISSECTION;  Surgeon: Everitt Amber, MD;  Location[facility];  Service: Gynecology;  Laterality: Bilateral;  .  SPINAL CORD STIMULATOR INSERTION N/A 02/23/2014   Procedure: SPINAL CORD STIMULATOR PLACEMENT ;  Surgeon: Melina Schools, MD;  Location: [facility] [place];  Service: Orthopedics;  Laterality: N/A;  . TONSILLECTOMY    . TOTAL KNEE ARTHROPLASTY Right 08/31/2012   Procedure: RIGHT TOTAL KNEE ARTHROPLASTY;  Surgeon: Mauri Pole, MD;  Location[facility];  Service: Orthopedics;  Laterality: Right;  with LMA  . TUBAL LIGATION     Social History:  Social History   Socioeconomic History  . Marital status: Divorced    Spouse name: Not on file  . Number of children: Not on file  . Years of education: Not on file  . Highest education level: Not on file  Occupational History  . Not on file  Social Needs  . Financial resource strain: Not on file  . Food insecurity:    Worry: Not on file    Inability: Not on file  . Transportation needs:    Medical: Not on file    Non-medical: Not on file  Tobacco Use  . Smoking status: Never Smoker  . Smokeless tobacco: Never Used  Substance and Sexual Activity  . Alcohol use: No  . Drug use: No  . Sexual activity: Yes  Lifestyle  . Physical activity:    Days per week: Not on file    Minutes per session: Not on file  . Stress: Not on file    Relationships  . Social connections:    Talks on phone: Not on file    Gets together: Not on file    Attends religious service: Not on file    Active member of club or organization: Not on file    Attends meetings of clubs or organizations: Not on file    Relationship status: Not on file  . Intimate partner violence:    Fear of current or ex partner: Not on file    Emotionally abused: Not on file    Physically abused: Not on file    Forced sexual activity: Not on file  Other Topics Concern  . Not on file  Social History Narrative  . Not on file   Family History:  Family History  Problem Relation Age of Onset  . Stroke Mother   . Emphysema Mother   . COPD Mother   . Stroke Father   . Heart disease Father   . Emphysema Father     Review of Systems: Constitutional: Denies fevers, chills or abnormal weight loss Eyes: Denies blurriness of vision Ears, nose, mouth, throat, and face: Denies mucositis or sore throat Respiratory: Denies cough, dyspnea or wheezes Cardiovascular: Denies palpitation, chest discomfort or lower extremity swelling Gastrointestinal:  Denies nausea, constipation, diarrhea GU: Denies dysuria or incontinence Skin: Denies abnormal skin rashes Neurological: Per HPI Musculoskeletal: Back pain Behavioral/Psych: Denies anxiety, disturbance in thought content, and mood instability  Physical Exam: Vitals:   02/23/18 1117  BP: 135/65  Pulse: 65  Resp: 18  Temp: 98.2 F (36.8 C)  SpO2: 96%   KPS: 90. General: Alert, cooperative, pleasant, in no acute distress Head: Craniotomy scar noted, dry and intact. EENT: No conjunctival injection or scleral icterus. Oral mucosa moist Lungs: Resp effort normal Cardiac: Regular rate and rhythm Abdomen: Soft, non-distended abdomen Skin: No rashes cyanosis or petechiae. Extremities: No clubbing or edema  Neurologic Exam: Mental Status: Awake, alert, attentive to examiner. Oriented to self and environment.  Language is fluent with intact comprehension.  Cranial Nerves: Visual acuity is grossly normal. Visual fields are  full. Extra-ocular movements intact. No ptosis. Face is symmetric, tongue midline. Motor: Tone and bulk are normal. Power is full in both arms and legs. Reflexes are symmetric, no pathologic reflexes present. Intact finger to nose bilaterally Sensory: Intact to light touch and temperature Gait: Normal and tandem gait is normal.   Labs: I have reviewed the data as listed    Component Value Date/Time   NA 140 11/15/2017 1442   NA 141 01/10/2016 1412   K 5.0 11/15/2017 1442   K 4.0 01/10/2016 1412   CL 101 11/15/2017 1442   CO2 30 11/15/2017 1442   CO2 30 (H) 01/10/2016 1412   GLUCOSE 108 (H) 11/15/2017 1442   GLUCOSE 102 01/10/2016 1412   BUN 17 11/15/2017 1442   BUN 19.3 01/10/2016 1412   CREATININE 0.94 02/20/2018 1033   CREATININE 0.9 01/10/2016 1412   CALCIUM 11.1 (H) 11/15/2017 1442   CALCIUM 9.8 01/10/2016 1412   PROT 6.6 11/15/2017 1442   ALBUMIN 3.7 11/15/2017 1442   AST 24 11/15/2017 1442   ALT 17 11/15/2017 1442   ALKPHOS 89 11/15/2017 1442   BILITOT 0.6 11/15/2017 1442   GFRNONAA >60 02/20/2018 1033   GFRAA >60 02/20/2018 1033   Lab Results  Component Value Date   WBC 8.8 11/15/2017   NEUTROABS 6.1 07/12/2017   HGB 14.0 11/15/2017   HCT 44.0 11/15/2017   MCV 97.8 11/15/2017   PLT 242 11/15/2017   Component     Latest Ref Rng & Units 07/12/2017  Triiodothyronine,Free,Serum     2.0 - 4.4 pg/mL 1.4 (L)  T4,Free(Direct)     0.61 - 1.12 ng/dL 0.60 (L)  FSH     mIU/mL 0.7  Somatomedin C     38 - 163 ng/mL 108  LH     mIU/mL <0.2  Prolactin     4.8 - 23.3 ng/mL 4,548.0 (H)  C206 ACTH     7.2 - 63.3 pg/mL 15.4    Imaging:  [facility] Clinician Interpretation: I have personally reviewed the CNS images as listed.  My interpretation, in the context of the patient's clinical presentation, is stable disease  Ct Head W Wo Contrast  Result Date:  02/20/2018 CLINICAL DATA:  Pituitary macro adenoma. Headaches. EXAM: CT HEAD WITHOUT AND WITH CONTRAST TECHNIQUE: Contiguous axial images were obtained from the base of the skull through the vertex without and with intravenous contrast CONTRAST:  15mL OMNIPAQUE IOHEXOL 300 MG/ML  SOLN COMPARISON:  10/20/2017 FINDINGS: Brain: There is no evidence of acute infarct, intracranial hemorrhage, midline shift, or extra-axial fluid collection. An 8 mm hyperdense colloid cyst near the foramina of [place] is unchanged, and the ventricles are unchanged in size without evidence of hydrocephalus. Scattered cerebral white matter hypodensities are unchanged and nonspecific but compatible with mild chronic small vessel ischemic disease. 20 x 26 x 12 mm heterogeneously enhancing mass involving the sella and left cavernous sinus has not significantly changed in size, and there is no significant suprasellar extension. Vascular: Calcified atherosclerosis at the skull base. Patent major dural venous sinuses. Skull: No fracture or focal osseous lesion. Sinuses/Orbits: Visualized paranasal sinuses and mastoid air cells are clear. Bilateral cataract extraction. Other: None. IMPRESSION: 1. Unchanged pituitary macroadenoma invading the left cavernous sinus. 2. Unchanged 8 mm colloid cyst without hydrocephalus. 3. No evidence of acute intracranial abnormality. Electronically Signed   By: Logan Bores M.D.   On: 02/20/2018 14:44     Assessment/Plan 1. Pituitary macroadenoma [facility])  Kimberly Larsen is clinically and radiographically  stable today regarding her prolactinoma.  She should continue to take cabergoline via endocrinology.  For headaches and chronic pain, we advised she decrease or discontinue gabapentin due to polypharmacy and reliance on opiates.  If this is not tolerated she can resume at 600mg  BID.   We appreciate the opportunity to participate in the care of Kimberly Larsen.   She should return in 6 months with a contrast  enhanced CT head for evaluation.  All questions were answered. The patient knows to call the clinic with any problems, questions or concerns. No barriers to learning were detected.  The total time spent in the encounter was 40 minutes and more than 50% was on counseling and review of test results   Ventura Sellers, MD Medical Director of Neuro-Oncology [facility] [place] 02/23/18 11:02 AM

## 2018-02-25 DIAGNOSIS — I10 Essential (primary) hypertension: Secondary | ICD-10-CM | POA: Diagnosis not present

## 2018-02-25 DIAGNOSIS — K130 Diseases of lips: Secondary | ICD-10-CM | POA: Diagnosis not present

## 2018-02-25 DIAGNOSIS — F324 Major depressive disorder, single episode, in partial remission: Secondary | ICD-10-CM | POA: Diagnosis not present

## 2018-02-25 DIAGNOSIS — K219 Gastro-esophageal reflux disease without esophagitis: Secondary | ICD-10-CM | POA: Diagnosis not present

## 2018-02-25 DIAGNOSIS — Z6838 Body mass index (BMI) 38.0-38.9, adult: Secondary | ICD-10-CM | POA: Diagnosis not present

## 2018-03-05 DIAGNOSIS — M25562 Pain in left knee: Secondary | ICD-10-CM | POA: Diagnosis not present

## 2018-03-05 DIAGNOSIS — M1712 Unilateral primary osteoarthritis, left knee: Secondary | ICD-10-CM | POA: Diagnosis not present

## 2018-03-12 ENCOUNTER — Other Ambulatory Visit: Payer: Self-pay | Admitting: *Deleted

## 2018-03-18 DIAGNOSIS — D352 Benign neoplasm of pituitary gland: Secondary | ICD-10-CM | POA: Diagnosis not present

## 2018-03-19 DIAGNOSIS — Z789 Other specified health status: Secondary | ICD-10-CM | POA: Diagnosis not present

## 2018-03-19 DIAGNOSIS — Z7189 Other specified counseling: Secondary | ICD-10-CM | POA: Diagnosis not present

## 2018-04-03 DIAGNOSIS — E221 Hyperprolactinemia: Secondary | ICD-10-CM | POA: Diagnosis not present

## 2018-04-03 DIAGNOSIS — D352 Benign neoplasm of pituitary gland: Secondary | ICD-10-CM | POA: Diagnosis not present

## 2018-04-03 DIAGNOSIS — E038 Other specified hypothyroidism: Secondary | ICD-10-CM | POA: Diagnosis not present

## 2018-04-06 DIAGNOSIS — G894 Chronic pain syndrome: Secondary | ICD-10-CM | POA: Diagnosis not present

## 2018-04-06 DIAGNOSIS — M545 Low back pain: Secondary | ICD-10-CM | POA: Diagnosis not present

## 2018-04-06 DIAGNOSIS — M961 Postlaminectomy syndrome, not elsewhere classified: Secondary | ICD-10-CM | POA: Diagnosis not present

## 2018-04-06 DIAGNOSIS — Z79891 Long term (current) use of opiate analgesic: Secondary | ICD-10-CM | POA: Diagnosis not present

## 2018-04-20 DIAGNOSIS — D225 Melanocytic nevi of trunk: Secondary | ICD-10-CM | POA: Diagnosis not present

## 2018-04-20 DIAGNOSIS — D1801 Hemangioma of skin and subcutaneous tissue: Secondary | ICD-10-CM | POA: Diagnosis not present

## 2018-04-20 DIAGNOSIS — L821 Other seborrheic keratosis: Secondary | ICD-10-CM | POA: Diagnosis not present

## 2018-04-20 DIAGNOSIS — D2239 Melanocytic nevi of other parts of face: Secondary | ICD-10-CM | POA: Diagnosis not present

## 2018-04-20 DIAGNOSIS — D485 Neoplasm of uncertain behavior of skin: Secondary | ICD-10-CM | POA: Diagnosis not present

## 2018-04-27 DIAGNOSIS — R52 Pain, unspecified: Secondary | ICD-10-CM | POA: Diagnosis not present

## 2018-04-27 DIAGNOSIS — M25561 Pain in right knee: Secondary | ICD-10-CM | POA: Diagnosis not present

## 2018-04-27 DIAGNOSIS — E860 Dehydration: Secondary | ICD-10-CM | POA: Diagnosis not present

## 2018-04-27 DIAGNOSIS — I10 Essential (primary) hypertension: Secondary | ICD-10-CM | POA: Diagnosis not present

## 2018-04-30 ENCOUNTER — Telehealth: Payer: Self-pay | Admitting: *Deleted

## 2018-04-30 DIAGNOSIS — M9902 Segmental and somatic dysfunction of thoracic region: Secondary | ICD-10-CM | POA: Diagnosis not present

## 2018-04-30 DIAGNOSIS — M9904 Segmental and somatic dysfunction of sacral region: Secondary | ICD-10-CM | POA: Diagnosis not present

## 2018-04-30 DIAGNOSIS — M5032 Other cervical disc degeneration, mid-cervical region, unspecified level: Secondary | ICD-10-CM | POA: Diagnosis not present

## 2018-04-30 DIAGNOSIS — M9901 Segmental and somatic dysfunction of cervical region: Secondary | ICD-10-CM | POA: Diagnosis not present

## 2018-04-30 DIAGNOSIS — M9903 Segmental and somatic dysfunction of lumbar region: Secondary | ICD-10-CM | POA: Diagnosis not present

## 2018-04-30 NOTE — Telephone Encounter (Signed)
Patient called and rescheduled her appt to 1/15 due to back pain

## 2018-05-01 ENCOUNTER — Inpatient Hospital Stay: Payer: Medicare Other | Admitting: Gynecologic Oncology

## 2018-05-04 DIAGNOSIS — M9904 Segmental and somatic dysfunction of sacral region: Secondary | ICD-10-CM | POA: Diagnosis not present

## 2018-05-04 DIAGNOSIS — M9903 Segmental and somatic dysfunction of lumbar region: Secondary | ICD-10-CM | POA: Diagnosis not present

## 2018-05-04 DIAGNOSIS — M5032 Other cervical disc degeneration, mid-cervical region, unspecified level: Secondary | ICD-10-CM | POA: Diagnosis not present

## 2018-05-04 DIAGNOSIS — M9902 Segmental and somatic dysfunction of thoracic region: Secondary | ICD-10-CM | POA: Diagnosis not present

## 2018-05-04 DIAGNOSIS — M9901 Segmental and somatic dysfunction of cervical region: Secondary | ICD-10-CM | POA: Diagnosis not present

## 2018-05-07 DIAGNOSIS — M9904 Segmental and somatic dysfunction of sacral region: Secondary | ICD-10-CM | POA: Diagnosis not present

## 2018-05-07 DIAGNOSIS — M9903 Segmental and somatic dysfunction of lumbar region: Secondary | ICD-10-CM | POA: Diagnosis not present

## 2018-05-07 DIAGNOSIS — M9902 Segmental and somatic dysfunction of thoracic region: Secondary | ICD-10-CM | POA: Diagnosis not present

## 2018-05-07 DIAGNOSIS — M5032 Other cervical disc degeneration, mid-cervical region, unspecified level: Secondary | ICD-10-CM | POA: Diagnosis not present

## 2018-05-07 DIAGNOSIS — M9901 Segmental and somatic dysfunction of cervical region: Secondary | ICD-10-CM | POA: Diagnosis not present

## 2018-05-11 DIAGNOSIS — M5416 Radiculopathy, lumbar region: Secondary | ICD-10-CM | POA: Diagnosis not present

## 2018-05-11 DIAGNOSIS — M5417 Radiculopathy, lumbosacral region: Secondary | ICD-10-CM | POA: Diagnosis not present

## 2018-05-12 DIAGNOSIS — D443 Neoplasm of uncertain behavior of pituitary gland: Secondary | ICD-10-CM | POA: Diagnosis not present

## 2018-05-12 DIAGNOSIS — Z961 Presence of intraocular lens: Secondary | ICD-10-CM | POA: Diagnosis not present

## 2018-05-12 DIAGNOSIS — H02839 Dermatochalasis of unspecified eye, unspecified eyelid: Secondary | ICD-10-CM | POA: Diagnosis not present

## 2018-05-12 DIAGNOSIS — H18413 Arcus senilis, bilateral: Secondary | ICD-10-CM | POA: Diagnosis not present

## 2018-05-13 ENCOUNTER — Inpatient Hospital Stay: Payer: Medicare Other | Attending: Internal Medicine | Admitting: Gynecologic Oncology

## 2018-05-13 ENCOUNTER — Encounter: Payer: Self-pay | Admitting: Gynecologic Oncology

## 2018-05-13 VITALS — BP 123/66 | HR 68 | Temp 98.4°F | Resp 18 | Ht 62.0 in | Wt 202.0 lb

## 2018-05-13 DIAGNOSIS — Z90722 Acquired absence of ovaries, bilateral: Secondary | ICD-10-CM | POA: Insufficient documentation

## 2018-05-13 DIAGNOSIS — Z90710 Acquired absence of both cervix and uterus: Secondary | ICD-10-CM | POA: Insufficient documentation

## 2018-05-13 DIAGNOSIS — C541 Malignant neoplasm of endometrium: Secondary | ICD-10-CM | POA: Insufficient documentation

## 2018-05-13 NOTE — Progress Notes (Signed)
Consult Note: Gyn-Onc  Kimberly Larsen 73 y.o. female  CC:  Chief Complaint  Patient presents with  . Endometrial cancer [facility])   Assessment/Plan:  73 year old with stage IA grade 1 endometrioid endometrial adenocarcinoma s/p surgical staging on 01/16/16.  Her endometrial biopsy (originally read as high grade serous) has been reviewed and felt to be more consistent with moderately differentiated endometrial cancer.  Therefore, given the dominant grade 1 disease in her hystectomy specimen, she was felt to have a low risk cancer.  Pathology revealed low risk factors for recurrence, therefore no adjuvant therapy was recommended according to NCCN guidelines.  I discussed risk for recurrence and typical symptoms encouraged her to notify us of these should they develop between visits.  I recommend she continue to have follow-up every 6 months for 5 years in accordance with NCCN guidelines. Those visits should include symptom assessment, physical exam and pelvic examination. Pap smears are not indicated or recommended in the routine surveillance of endometrial cancer. I recommend she return to see Dr Pamala Hurry in 6 months and myself in 12 months.  HPI: Patient was originally seen on 01/10/16 in consultation at the request of Dr. Pamala Hurry.   Patient is a very pleasant 73 year old gravida 1 para 1 who has a long history of renal stones and has been seen by urology. She presented to urology and was seen by Dr. Alinda Money on August 30 for complaints of gross hematuria and right lower quadrant pain. However at that time she was noted to have vaginal bleeding and her bladder catheterization revealed no blood within the bladder. She was seen by Dr. Valentino Saxon on 12/29/2015. At that time an endometrial biopsy was performed as well as a Pap smear. Pap smear was unremarkable. 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 Prior to this episode of bleeding she had not had any menses for approximately  16 years.  Interval Hx:  On 01/16/16 she underwent robotic assisted total hysterectomy, BSO and pelvic lymphadenectomy. Final pathology revealed stage IA grade 1 endometrial cancer with inner half myometrial invasion and no LVSI. She was noted to have low risk factors of final pathology and therefore no adjuvant therapy was recommended in accordance with NCCN guidelines.  She presents today for routine follow-up.  She has been diagnosed with a pituitary tumor (treated medically) and S1 neuropathy since I last saw her. No symptoms of recurrence.  Review of Systems  Constitutional:  Denies fever. Skin: No rash Cardiovascular: No chest pain, shortness of breath except as above, or edema  Pulmonary: No cough  Gastro Intestinal: Slight nausea, vomiting, constipation, or diarrhea reported. No change in bowel movement.  Genitourinary: No frequency, urgency, or dysuria.  no vaginal bleeding, no discharge.  Musculoskeletal: + Low back pain and joint pain in her knees.  Neurologic: No weakness, numbness, or change in gait.  Psychology: Anxious  Current Meds:  Outpatient Encounter Medications as of 05/13/2018  Medication Sig  . ALPRAZolam (XANAX) 0.25 MG tablet Take 0.25 mg by mouth 3 (three) times daily as needed for anxiety.   Marland Kitchen aspirin EC 81 MG tablet Take 81 mg by mouth daily.  . B Complex Vitamins (B COMPLEX PO) Take 1 tablet by mouth daily.  . cabergoline (DOSTINEX) 0.5 MG tablet Take 0.5 mg by mouth 2 (two) times a week. Patient takes 3 tablets on Tues and 3 tablets on Friday  . DULoxetine (CYMBALTA) 30 MG capsule Take 30 mg by mouth daily.   . DULoxetine (CYMBALTA) 60 MG capsule Take  60 mg by mouth daily.  . fluticasone (FLONASE) 50 MCG/ACT nasal spray Place 1 spray into both nostrils daily.  Marland Kitchen gabapentin (NEURONTIN) 600 MG tablet Take 600 mg by mouth 3 (three) times daily.  Marland Kitchen levothyroxine (SYNTHROID, LEVOTHROID) 50 MCG tablet Take 50 mcg by mouth daily before breakfast.   . Magnesium  500 MG TABS Take 250 mg by mouth daily.   . Melatonin 10 MG CAPS Take 10 mg by mouth at bedtime.  . metoprolol succinate (TOPROL-XL) 25 MG 24 hr tablet Take 25 mg by mouth daily.   . montelukast (SINGULAIR) 10 MG tablet Take 10 mg by mouth at bedtime.   . Multiple Vitamin (MULTIVITAMIN WITH MINERALS) TABS Take 1 tablet by mouth daily.  . naloxone (NARCAN) nasal spray 4 mg/0.1 mL Place 1 spray into the nose See admin instructions. Take by nasal route every 3 minutes until patient awakes or EMS arrives.  . Omega-3 Fatty Acids (FISH OIL) 600 MG CAPS Take 1 capsule by mouth 2 (two) times daily. (Patient taking differently: Take 600 mg by mouth 2 (two) times daily. )  . omeprazole (PRILOSEC) 20 MG capsule Take 1 capsule (20 mg total) by mouth daily.  . ondansetron (ZOFRAN) 4 MG tablet Take 1 tablet by mouth every 6 (six) hours as needed for nausea or vomiting.   Marland Kitchen oxyCODONE-acetaminophen (PERCOCET) 10-325 MG tablet Take 1 tablet by mouth 3 (three) times daily as needed for pain.  . polyethylene glycol (MIRALAX / GLYCOLAX) packet Take 17 g by mouth 2 (two) times daily. (Patient taking differently: Take 17 g by mouth 2 (two) times daily as needed for mild constipation. )  . pravastatin (PRAVACHOL) 40 MG tablet Take 1 tablet (40 mg total) by mouth at bedtime.  Marland Kitchen Propylene Glycol (SYSTANE BALANCE) 0.6 % SOLN Place 1-2 drops into both eyes 4 (four) times daily.   . traZODone (DESYREL) 100 MG tablet Take 100 mg by mouth at bedtime.  . Multiple Vitamins-Minerals (HAIR SKIN AND NAILS FORMULA PO) Take 1 capsule by mouth daily.   . [DISCONTINUED] docusate sodium (COLACE) 100 MG capsule Take 1 capsule (100 mg total) by mouth 2 (two) times daily. While taking narcotic pain medicine. (Patient not taking: Reported on 05/13/2018)   No facility-administered encounter medications on file as of 05/13/2018.     Allergy:  Allergies  Allergen Reactions  . Augmentin [Amoxicillin-Pot Clavulanate] Nausea Only  . Wellbutrin  [Bupropion] Nausea Only    Social Hx:   Social History   Socioeconomic History  . Marital status: Divorced    Spouse name: Not on file  . Number of children: Not on file  . Years of education: Not on file  . Highest education level: Not on file  Occupational History  . Not on file  Social Needs  . Financial resource strain: Not on file  . Food insecurity:    Worry: Not on file    Inability: Not on file  . Transportation needs:    Medical: Not on file    Non-medical: Not on file  Tobacco Use  . Smoking status: Never Smoker  . Smokeless tobacco: Never Used  Substance and Sexual Activity  . Alcohol use: No  . Drug use: No  . Sexual activity: Yes  Lifestyle  . Physical activity:    Days per week: Not on file    Minutes per session: Not on file  . Stress: Not on file  Relationships  . Social connections:    Talks on phone:  Not on file    Gets together: Not on file    Attends religious service: Not on file    Active member of club or organization: Not on file    Attends meetings of clubs or organizations: Not on file    Relationship status: Not on file  . Intimate partner violence:    Fear of current or ex partner: Not on file    Emotionally abused: Not on file    Physically abused: Not on file    Forced sexual activity: Not on file  Other Topics Concern  . Not on file  Social History Narrative  . Not on file    Past Surgical Hx:  Past Surgical History:  Procedure Laterality Date  . BACK SURGERY  2013  . CARPAL TUNNEL RELEASE     R hand  . CYSTOSCOPY WITH URETEROSCOPY AND STENT PLACEMENT Bilateral 03/25/2014   Procedure: CYSTOSCOPY WITH URETEROSCOPY AND STENT PLACEMENT;  Surgeon: Raynelle Bring, MD;  Location: [facility];  Service: Urology;  Laterality: Bilateral;  . CYSTOSCOPY WITH URETEROSCOPY AND STENT PLACEMENT Bilateral 04/18/2014   Procedure: CYSTOSCOPY WITH URETEROSCOPY AND STENT PLACEMENT,RETROGRADE;  Surgeon: Raynelle Bring, MD;  Location: [facility];  Service:  Urology;  Laterality: Bilateral;  . CYSTOSCOPY WITH URETEROSCOPY AND STENT PLACEMENT Right 05/30/2014   Procedure: CYSTOSCOPY WITH URETEROSCOPY AND STENT PLACEMENT;  Surgeon: Raynelle Bring, MD;  Location: [facility];  Service: Urology;  Laterality: Right;  . CYSTOSCOPY/RETROGRADE/URETEROSCOPY Left 05/30/2014   Procedure: LEFT RETROGRADE;  Surgeon: Raynelle Bring, MD;  Location: [facility];  Service: Urology;  Laterality: Left;  . EYE SURGERY     cataract surgery bil  . GANGLION CYST EXCISION     L ankle  . HAMMERTOE RECONSTRUCTION WITH WEIL OSTEOTOMY Left 09/05/2016   Procedure: Second Metatarsal Weil Osteotomy and Hammertoe Correction;  Surgeon: Wylene Simmer, MD;  Location: [place];  Service: Orthopedics;  Laterality: Left;  . HOLMIUM LASER APPLICATION Bilateral 41/66/0630   Procedure: HOLMIUM LASER APPLICATION;  Surgeon: Raynelle Bring, MD;  Location: [facility];  Service: Urology;  Laterality: Bilateral;  . JOINT REPLACEMENT     total knee  . LITHOTRIPSY    . LUMBAR FUSION  09/2011  . METATARSAL OSTEOTOMY WITH BUNIONECTOMY Left 09/05/2016   Procedure: Left First Metatarsal Scarf Osteotomy, Modified McBride Bunion Correction;  Surgeon: Wylene Simmer, MD;  Location: [place];  Service: Orthopedics;  Laterality: Left;  . RHINOPLASTY    . ROBOTIC ASSISTED TOTAL HYSTERECTOMY WITH BILATERAL SALPINGO OOPHERECTOMY Bilateral 01/16/2016   Procedure: XI ROBOTIC ASSISTED TOTAL HYSTERECTOMY WITH BILATERAL SALPINGO OOPHORECTOMY AND BILATERAL PELVIC LYMPH NODE DISSECTION;  Surgeon: Everitt Amber, MD;  Location: [facility];  Service: Gynecology;  Laterality: Bilateral;  . SPINAL CORD STIMULATOR INSERTION N/A 02/23/2014   Procedure: SPINAL CORD STIMULATOR PLACEMENT ;  Surgeon: Melina Schools, MD;  Location: [place];  Service: Orthopedics;  Laterality: N/A;  . TONSILLECTOMY    . TOTAL KNEE ARTHROPLASTY Right 08/31/2012   Procedure: RIGHT TOTAL KNEE ARTHROPLASTY;  Surgeon: Mauri Pole, MD;  Location: [facility];  Service: Orthopedics;  Laterality: Right;  with LMA  . TUBAL LIGATION      Past Medical Hx:  Past Medical History:  Diagnosis Date  . Anxiety   . Arthritis   . Back pain   . Cancer [facility])    endometrial cancer  . DDD (degenerative disc disease), lumbar   . Depression   . Gastroparesis   . History of kidney stones   .  HLD (hyperlipidemia)   . Hx of sepsis 02/2014   DUE TO MULTIPLE KIDNEY STONES  . Hypertension   . Osteoporosis   . Pneumonia    2014  . UTI (urinary tract infection)     Oncology Hx:    Endometrial cancer ([place])   12/29/2015 Initial Diagnosis    Endometrial cancer [facility])     Genetic Testing    Patient has genetic testing done for mismatch repair protien. Results revealed patient has the following mutation(s): Abnormal: MSH6: loss of nuclear expression (less than 5% tumor expression).     Family Hx:  Family History  Problem Relation Age of Onset  . Stroke Mother   . Emphysema Mother   . COPD Mother   . Stroke Father   . Heart disease Father   . Emphysema Father     Vitals:  Blood pressure 123/66, pulse 68, temperature 98.4 F (36.9 C), temperature source Oral, resp. rate 18, height 5' 2" (1.575 m), weight 202 lb (91.6 kg), SpO2 96 %.  Physical Exam: Well-nourished well-developed female in no acute distress.   Neck: Supple, no lymphadenopathy, no thyromegaly.  Lungs: Clear to auscultation.  Cardiac: Regular rate and rhythm.  Abdomen: Obese, soft, nontender, nondistended. No palpable masses. There is no rebound, no guarding. No fluid wave. There is no hepatosplenomegaly. Incisions are well healed.  Groins: No lymphadenopathy  Extremities: No edema  Pelvic: Normal female genitalia. The vagina is atrophic. Vaginal cuff healing normally. No blood or discharge. Cuff in tact.   Thereasa Solo, MD 05/13/2018, 1:01 PM

## 2018-05-13 NOTE — Patient Instructions (Addendum)
Please notify Dr Denman George at phone number [phone] if you notice vaginal bleeding, new pelvic or abdominal pains, bloating, feeling full easy, or a change in bladder or bowel function.   Please return to see Dr Denman George or Dr Valentino Saxon in July, 2020. Then Dr Denman George in January, 2020.

## 2018-05-20 DIAGNOSIS — R8279 Other abnormal findings on microbiological examination of urine: Secondary | ICD-10-CM | POA: Diagnosis not present

## 2018-05-20 DIAGNOSIS — N200 Calculus of kidney: Secondary | ICD-10-CM | POA: Diagnosis not present

## 2018-05-27 DIAGNOSIS — M9904 Segmental and somatic dysfunction of sacral region: Secondary | ICD-10-CM | POA: Diagnosis not present

## 2018-05-27 DIAGNOSIS — M9903 Segmental and somatic dysfunction of lumbar region: Secondary | ICD-10-CM | POA: Diagnosis not present

## 2018-05-27 DIAGNOSIS — M9901 Segmental and somatic dysfunction of cervical region: Secondary | ICD-10-CM | POA: Diagnosis not present

## 2018-05-27 DIAGNOSIS — M9902 Segmental and somatic dysfunction of thoracic region: Secondary | ICD-10-CM | POA: Diagnosis not present

## 2018-05-27 DIAGNOSIS — M5032 Other cervical disc degeneration, mid-cervical region, unspecified level: Secondary | ICD-10-CM | POA: Diagnosis not present

## 2018-06-02 ENCOUNTER — Telehealth: Payer: Self-pay | Admitting: Oncology

## 2018-06-02 NOTE — Telephone Encounter (Signed)
Kimberly Larsen called to schedule an appointment with Dr. Denman George for July 2020.  Advised her that the schedule is not open yet and to call back in March.  She verbalized agreement.

## 2018-06-23 DIAGNOSIS — J189 Pneumonia, unspecified organism: Secondary | ICD-10-CM | POA: Diagnosis not present

## 2018-07-08 DIAGNOSIS — E038 Other specified hypothyroidism: Secondary | ICD-10-CM | POA: Diagnosis not present

## 2018-07-08 DIAGNOSIS — D352 Benign neoplasm of pituitary gland: Secondary | ICD-10-CM | POA: Diagnosis not present

## 2018-07-09 DIAGNOSIS — Z01818 Encounter for other preprocedural examination: Secondary | ICD-10-CM | POA: Diagnosis not present

## 2018-07-09 DIAGNOSIS — I10 Essential (primary) hypertension: Secondary | ICD-10-CM | POA: Diagnosis not present

## 2018-07-09 DIAGNOSIS — R05 Cough: Secondary | ICD-10-CM | POA: Diagnosis not present

## 2018-07-09 DIAGNOSIS — Z6838 Body mass index (BMI) 38.0-38.9, adult: Secondary | ICD-10-CM | POA: Diagnosis not present

## 2018-07-09 DIAGNOSIS — Z8701 Personal history of pneumonia (recurrent): Secondary | ICD-10-CM | POA: Diagnosis not present

## 2018-07-09 DIAGNOSIS — E669 Obesity, unspecified: Secondary | ICD-10-CM | POA: Diagnosis not present

## 2018-07-09 DIAGNOSIS — Z79899 Other long term (current) drug therapy: Secondary | ICD-10-CM | POA: Diagnosis not present

## 2018-07-09 DIAGNOSIS — I700 Atherosclerosis of aorta: Secondary | ICD-10-CM | POA: Diagnosis not present

## 2018-07-28 ENCOUNTER — Encounter (HOSPITAL_COMMUNITY): Admission: RE | Payer: Self-pay | Source: Home / Self Care

## 2018-07-28 ENCOUNTER — Inpatient Hospital Stay (HOSPITAL_COMMUNITY): Admission: RE | Admit: 2018-07-28 | Payer: Medicare Other | Source: Home / Self Care | Admitting: Orthopedic Surgery

## 2018-07-28 SURGERY — ARTHROPLASTY, KNEE, TOTAL
Anesthesia: Spinal | Laterality: Left

## 2018-08-07 ENCOUNTER — Other Ambulatory Visit: Payer: Self-pay | Admitting: *Deleted

## 2018-08-07 DIAGNOSIS — D352 Benign neoplasm of pituitary gland: Secondary | ICD-10-CM

## 2018-08-24 ENCOUNTER — Ambulatory Visit: Payer: Medicare Other | Admitting: Internal Medicine

## 2018-09-03 DIAGNOSIS — Z5181 Encounter for therapeutic drug level monitoring: Secondary | ICD-10-CM | POA: Diagnosis not present

## 2018-09-03 DIAGNOSIS — M5416 Radiculopathy, lumbar region: Secondary | ICD-10-CM | POA: Diagnosis not present

## 2018-09-03 DIAGNOSIS — Z79899 Other long term (current) drug therapy: Secondary | ICD-10-CM | POA: Diagnosis not present

## 2018-09-07 ENCOUNTER — Other Ambulatory Visit: Payer: Self-pay | Admitting: *Deleted

## 2018-09-07 DIAGNOSIS — D352 Benign neoplasm of pituitary gland: Secondary | ICD-10-CM

## 2018-09-17 DIAGNOSIS — M961 Postlaminectomy syndrome, not elsewhere classified: Secondary | ICD-10-CM | POA: Diagnosis not present

## 2018-09-17 DIAGNOSIS — M5416 Radiculopathy, lumbar region: Secondary | ICD-10-CM | POA: Diagnosis not present

## 2018-09-22 ENCOUNTER — Other Ambulatory Visit: Payer: Self-pay | Admitting: Radiation Therapy

## 2018-09-22 DIAGNOSIS — Z79899 Other long term (current) drug therapy: Secondary | ICD-10-CM | POA: Diagnosis not present

## 2018-09-22 DIAGNOSIS — I10 Essential (primary) hypertension: Secondary | ICD-10-CM | POA: Diagnosis not present

## 2018-09-22 DIAGNOSIS — E7800 Pure hypercholesterolemia, unspecified: Secondary | ICD-10-CM | POA: Diagnosis not present

## 2018-09-22 DIAGNOSIS — F324 Major depressive disorder, single episode, in partial remission: Secondary | ICD-10-CM | POA: Diagnosis not present

## 2018-09-22 DIAGNOSIS — K219 Gastro-esophageal reflux disease without esophagitis: Secondary | ICD-10-CM | POA: Diagnosis not present

## 2018-09-22 DIAGNOSIS — Z1239 Encounter for other screening for malignant neoplasm of breast: Secondary | ICD-10-CM | POA: Diagnosis not present

## 2018-09-22 DIAGNOSIS — E669 Obesity, unspecified: Secondary | ICD-10-CM | POA: Diagnosis not present

## 2018-09-22 DIAGNOSIS — Z6839 Body mass index (BMI) 39.0-39.9, adult: Secondary | ICD-10-CM | POA: Diagnosis not present

## 2018-09-23 ENCOUNTER — Ambulatory Visit (HOSPITAL_COMMUNITY)
Admission: RE | Admit: 2018-09-23 | Discharge: 2018-09-23 | Disposition: A | Discharge status: Home / Self Care | Payer: Medicare Other | Source: Ambulatory Visit | Attending: Internal Medicine | Admitting: Internal Medicine

## 2018-09-23 ENCOUNTER — Other Ambulatory Visit: Payer: Self-pay

## 2018-09-23 DIAGNOSIS — D352 Benign neoplasm of pituitary gland: Secondary | ICD-10-CM | POA: Insufficient documentation

## 2018-09-23 LAB — POCT I-STAT CREATININE: Creatinine, Ser: 0.9 mg/dL (ref 0.44–1.00)

## 2018-09-23 MED ORDER — IOHEXOL 300 MG/ML  SOLN
75.0000 mL | Freq: Once | INTRAMUSCULAR | Status: AC | PRN
  Administered 2018-09-23: 11:00:00 75 mL via INTRAVENOUS

## 2018-09-23 MED ORDER — SODIUM CHLORIDE (PF) 0.9 % IJ SOLN
INTRAMUSCULAR | Status: AC
  Filled 2018-09-23: qty 50

## 2018-09-28 ENCOUNTER — Telehealth: Payer: Self-pay | Admitting: Internal Medicine

## 2018-09-28 ENCOUNTER — Inpatient Hospital Stay: Payer: Medicare Other | Attending: Internal Medicine | Admitting: Internal Medicine

## 2018-09-28 ENCOUNTER — Inpatient Hospital Stay: Payer: Medicare Other

## 2018-09-28 ENCOUNTER — Other Ambulatory Visit: Payer: Self-pay

## 2018-09-28 VITALS — BP 140/79 | HR 78 | Temp 98.7°F | Resp 18 | Ht 62.0 in | Wt 208.9 lb

## 2018-09-28 DIAGNOSIS — D352 Benign neoplasm of pituitary gland: Secondary | ICD-10-CM | POA: Diagnosis not present

## 2018-09-28 DIAGNOSIS — M545 Low back pain: Secondary | ICD-10-CM | POA: Diagnosis not present

## 2018-09-28 DIAGNOSIS — G8929 Other chronic pain: Secondary | ICD-10-CM | POA: Insufficient documentation

## 2018-09-28 DIAGNOSIS — R5383 Other fatigue: Secondary | ICD-10-CM | POA: Diagnosis not present

## 2018-09-28 NOTE — Telephone Encounter (Signed)
Scheduled appt per 6/1 los. 

## 2018-09-28 NOTE — Progress Notes (Signed)
[facility] [place], [place] [address] [phone]   Interval Evaluation  Date of Service: 09/28/18 Patient Name: Kimberly Larsen Patient MRN: [id number] Patient DOB: 08/09/1945 Provider: Ventura Sellers, MD  Identifying Statement:  Kimberly Larsen is a 73 y.o. 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  She continues to take cabergoline through Dr. Buddy Duty.  No longer complains of headaches.  Continues to take oxycodone 2x per day due to chronic back pain.  Medications: Current Outpatient Medications on File Prior to Visit  Medication Sig Dispense Refill   ALPRAZolam (XANAX) 0.25 MG tablet Take 0.25 mg by mouth 3 (three) times daily as needed for anxiety.      aspirin EC 81 MG tablet Take 81 mg by mouth daily.     B Complex Vitamins (B COMPLEX PO) Take 1 tablet by mouth daily.     cabergoline (DOSTINEX) 0.5 MG tablet Take 0.5 mg by mouth 2 (two) times a week. Patient takes 3 tablets on Tues and 3 tablets on Friday     DULoxetine (CYMBALTA) 30 MG capsule Take 30 mg by mouth daily.      DULoxetine (CYMBALTA) 60 MG capsule Take 60 mg by mouth daily.     fluticasone (FLONASE) 50 MCG/ACT nasal spray Place 1 spray into both nostrils daily.     gabapentin (NEURONTIN) 600 MG tablet Take 600 mg by mouth 3 (three) times daily.     levothyroxine (SYNTHROID, LEVOTHROID) 50 MCG tablet Take 50 mcg by mouth daily before breakfast.   5   Magnesium 500 MG TABS Take 250 mg by mouth daily.      Melatonin 10 MG CAPS Take 10 mg by mouth at bedtime.     metoprolol succinate (TOPROL-XL) 25 MG 24 hr tablet Take 25 mg by mouth daily.   0   montelukast (SINGULAIR) 10 MG tablet Take 10 mg by mouth at bedtime.   1   Multiple Vitamin (MULTIVITAMIN WITH MINERALS) TABS Take 1 tablet by mouth daily.     Multiple Vitamins-Minerals (HAIR SKIN AND NAILS FORMULA PO) Take 1 capsule by mouth  daily.      naloxone (NARCAN) nasal spray 4 mg/0.1 mL Place 1 spray into the nose See admin instructions. Take by nasal route every 3 minutes until patient awakes or EMS arrives.     Omega-3 Fatty Acids (FISH OIL) 600 MG CAPS Take 1 capsule by mouth 2 (two) times daily. (Patient taking differently: Take 600 mg by mouth 2 (two) times daily. ) 30 capsule    omeprazole (PRILOSEC) 20 MG capsule Take 1 capsule (20 mg total) by mouth daily. 30 capsule 0   ondansetron (ZOFRAN) 4 MG tablet Take 1 tablet by mouth every 6 (six) hours as needed for nausea or vomiting.      oxyCODONE-acetaminophen (PERCOCET) 10-325 MG tablet Take 1 tablet by mouth 3 (three) times daily as needed for pain.     polyethylene glycol (MIRALAX / GLYCOLAX) packet Take 17 g by mouth 2 (two) times daily. (Patient taking differently: Take 17 g by mouth 2 (two) times daily as needed for mild constipation. ) 14 each 0   pravastatin (PRAVACHOL) 40 MG tablet Take 1 tablet (40 mg total) by mouth at bedtime. 30 tablet 0   Propylene Glycol (SYSTANE BALANCE) 0.6 % SOLN Place 1-2 drops into both eyes 4 (four) times daily.  traZODone (DESYREL) 100 MG tablet Take 100 mg by mouth at bedtime.     No current facility-administered medications on file prior to visit.     Allergies:  Allergies  Allergen Reactions   Augmentin [Amoxicillin-Pot Clavulanate] Nausea Only   Wellbutrin [Bupropion] Nausea Only   Past Medical History:  Past Medical History:  Diagnosis Date   Anxiety    Arthritis    Back pain    Cancer ([place])    endometrial cancer   DDD (degenerative disc disease), lumbar    Depression    Gastroparesis    History of kidney stones    HLD (hyperlipidemia)    Hx of sepsis 02/2014   DUE TO MULTIPLE KIDNEY STONES   Hypertension    Osteoporosis    Pneumonia    2014   UTI (urinary tract infection)    Past Surgical History:  Past Surgical History:  Procedure Laterality Date   BACK SURGERY  2013    CARPAL TUNNEL RELEASE     R hand   CYSTOSCOPY WITH URETEROSCOPY AND STENT PLACEMENT Bilateral 03/25/2014   Procedure: CYSTOSCOPY WITH URETEROSCOPY AND STENT PLACEMENT;  Surgeon: Raynelle Bring, MD;  Location: [facility];  Service: Urology;  Laterality: Bilateral;   CYSTOSCOPY WITH URETEROSCOPY AND STENT PLACEMENT Bilateral 04/18/2014   Procedure: CYSTOSCOPY WITH URETEROSCOPY AND STENT PLACEMENT,RETROGRADE;  Surgeon: Raynelle Bring, MD;  Location: [facility];  Service: Urology;  Laterality: Bilateral;   CYSTOSCOPY WITH URETEROSCOPY AND STENT PLACEMENT Right 05/30/2014   Procedure: CYSTOSCOPY WITH URETEROSCOPY AND STENT PLACEMENT;  Surgeon: Raynelle Bring, MD;  Location: [facility];  Service: Urology;  Laterality: Right;   CYSTOSCOPY/RETROGRADE/URETEROSCOPY Left 05/30/2014   Procedure: LEFT RETROGRADE;  Surgeon: Raynelle Bring, MD;  Location: [facility];  Service: Urology;  Laterality: Left;   EYE SURGERY     cataract surgery bil   GANGLION CYST EXCISION     L ankle   HAMMERTOE RECONSTRUCTION WITH WEIL OSTEOTOMY Left 09/05/2016   Procedure: Second Metatarsal Weil Osteotomy and Hammertoe Correction;  Surgeon: Wylene Simmer, MD;  Location: [facility];  Service: Orthopedics;  Laterality: Left;   HOLMIUM LASER APPLICATION Bilateral 62/83/1517   Procedure: HOLMIUM LASER APPLICATION;  Surgeon: Raynelle Bring, MD;  Location: [facility];  Service: Urology;  Laterality: Bilateral;   JOINT REPLACEMENT     total knee   LITHOTRIPSY     LUMBAR FUSION  09/2011   METATARSAL OSTEOTOMY WITH BUNIONECTOMY Left 09/05/2016   Procedure: Left First Metatarsal Scarf Osteotomy, Modified McBride Bunion Correction;  Surgeon: Wylene Simmer, MD;  Location: [facility];  Service: Orthopedics;  Laterality: Left;   RHINOPLASTY     ROBOTIC ASSISTED TOTAL HYSTERECTOMY WITH BILATERAL SALPINGO OOPHERECTOMY Bilateral 01/16/2016   Procedure: XI ROBOTIC ASSISTED TOTAL HYSTERECTOMY WITH BILATERAL SALPINGO OOPHORECTOMY AND  BILATERAL PELVIC LYMPH NODE DISSECTION;  Surgeon: Everitt Amber, MD;  Location: [facility];  Service: Gynecology;  Laterality: Bilateral;   SPINAL CORD STIMULATOR INSERTION N/A 02/23/2014   Procedure: SPINAL CORD STIMULATOR PLACEMENT ;  Surgeon: Melina Schools, MD;  Location: [facility];  Service: Orthopedics;  Laterality: N/A;   TONSILLECTOMY     TOTAL KNEE ARTHROPLASTY Right 08/31/2012   Procedure: RIGHT TOTAL KNEE ARTHROPLASTY;  Surgeon: Mauri Pole, MD;  Location: [facility];  Service: Orthopedics;  Laterality: Right;  with LMA   TUBAL LIGATION     Social History:  Social History   Socioeconomic History   Marital status: Divorced    Spouse name: Not on file   Number  of children: Not on file   Years of education: Not on file   Highest education level: Not on file  Occupational History   Not on file  Social Needs   Financial resource strain: Not on file   Food insecurity:    Worry: Not on file    Inability: Not on file   Transportation needs:    Medical: Not on file    Non-medical: Not on file  Tobacco Use   Smoking status: Never Smoker   Smokeless tobacco: Never Used  Substance and Sexual Activity   Alcohol use: No   Drug use: No   Sexual activity: Yes  Lifestyle   Physical activity:    Days per week: Not on file    Minutes per session: Not on file   Stress: Not on file  Relationships   Social connections:    Talks on phone: Not on file    Gets together: Not on file    Attends religious service: Not on file    Active member of club or organization: Not on file    Attends meetings of clubs or organizations: Not on file    Relationship status: Not on file   Intimate partner violence:    Fear of current or ex partner: Not on file    Emotionally abused: Not on file    Physically abused: Not on file    Forced sexual activity: Not on file  Other Topics Concern   Not on file  Social History Narrative   Not on file   Family History:  Family History    Problem Relation Age of Onset   Stroke Mother    Emphysema Mother    COPD Mother    Stroke Father    Heart disease Father    Emphysema Father     Review of Systems: Constitutional: Denies fevers, chills or abnormal weight loss Eyes: Denies blurriness of vision Ears, nose, mouth, throat, and face: Denies mucositis or sore throat Respiratory: Denies cough, dyspnea or wheezes Cardiovascular: Denies palpitation, chest discomfort or lower extremity swelling Gastrointestinal:  Denies nausea, constipation, diarrhea GU: Denies dysuria or incontinence Skin: Denies abnormal skin rashes Neurological: Per HPI Musculoskeletal: Back pain Behavioral/Psych: Denies anxiety, disturbance in thought content, and mood instability  Physical Exam: Vitals:   09/28/18 0934  BP: 140/79  Pulse: 78  Resp: 18  Temp: 98.7 F (37.1 C)  SpO2: 97%   KPS: 90. General: Alert, cooperative, pleasant, in no acute distress Head: Craniotomy scar noted, dry and intact. EENT: No conjunctival injection or scleral icterus. Oral mucosa moist Lungs: Resp effort normal Cardiac: Regular rate and rhythm Abdomen: Soft, non-distended abdomen Skin: No rashes cyanosis or petechiae. Extremities: No clubbing or edema  Neurologic Exam: Mental Status: Awake, alert, attentive to examiner. Oriented to self and environment. Language is fluent with intact comprehension.  Cranial Nerves: Visual acuity is grossly normal. Visual fields are full. Extra-ocular movements intact. No ptosis. Face is symmetric, tongue midline. Motor: Tone and bulk are normal. Power is full in both arms and legs. Reflexes are symmetric, no pathologic reflexes present. Intact finger to nose bilaterally Sensory: Intact to light touch and temperature Gait: Normal and tandem gait is normal.   Labs: I have reviewed the data as listed    Component Value Date/Time   NA 140 11/15/2017 1442   NA 141 01/10/2016 1412   K 5.0 11/15/2017 1442   K 4.0  01/10/2016 1412   CL 101 11/15/2017 1442   CO2  30 11/15/2017 1442   CO2 30 (H) 01/10/2016 1412   GLUCOSE 108 (H) 11/15/2017 1442   GLUCOSE 102 01/10/2016 1412   BUN 17 11/15/2017 1442   BUN 19.3 01/10/2016 1412   CREATININE 0.90 09/23/2018 1119   CREATININE 0.94 02/20/2018 1033   CREATININE 0.9 01/10/2016 1412   CALCIUM 11.1 (H) 11/15/2017 1442   CALCIUM 9.8 01/10/2016 1412   PROT 6.6 11/15/2017 1442   ALBUMIN 3.7 11/15/2017 1442   AST 24 11/15/2017 1442   ALT 17 11/15/2017 1442   ALKPHOS 89 11/15/2017 1442   BILITOT 0.6 11/15/2017 1442   GFRNONAA >60 02/20/2018 1033   GFRAA >60 02/20/2018 1033   Lab Results  Component Value Date   WBC 8.8 11/15/2017   NEUTROABS 6.1 07/12/2017   HGB 14.0 11/15/2017   HCT 44.0 11/15/2017   MCV 97.8 11/15/2017   PLT 242 11/15/2017   Component     Latest Ref Rng & Units 07/12/2017  Triiodothyronine,Free,Serum     2.0 - 4.4 pg/mL 1.4 (L)  T4,Free(Direct)     0.61 - 1.12 ng/dL 0.60 (L)  FSH     mIU/mL 0.7  Somatomedin C     38 - 163 ng/mL 108  LH     mIU/mL <0.2  Prolactin     4.8 - 23.3 ng/mL 4,548.0 (H)  C206 ACTH     7.2 - 63.3 pg/mL 15.4    Imaging:  [facility] Clinician Interpretation: I have personally reviewed the CNS images as listed.  My interpretation, in the context of the patient's clinical presentation, is stable disease  Ct Head W & Wo Contrast  Result Date: 09/23/2018 CLINICAL DATA:  Follow-up pituitary macro adenoma. EXAM: CT HEAD WITHOUT AND WITH CONTRAST TECHNIQUE: Contiguous axial images were obtained from the base of the skull through the vertex without and with intravenous contrast CONTRAST:  27mL OMNIPAQUE IOHEXOL 300 MG/ML  SOLN COMPARISON:  02/20/2018.  10/20/2017.  07/12/2017.  07/10/2017. FINDINGS: Brain: Brain parenchyma itself shows minimal small vessel change of the hemispheric white matter without evidence of recent infarction or large vessel infarction. 8 mm colloid cyst of the anterior third ventricle is  unchanged without obstructive hydrocephalus. Ventricular size is normal. No extra-axial collection. Pituitary macro adenoma with left cavernous sinus invasion appears unchanged measuring approximately 27 x 12 x 21 mm. No suprasellar extension presently. Vascular: There is atherosclerotic calcification of the major vessels at the base of the brain. Skull: Negative Sinuses/Orbits: Clear/normal Other: None IMPRESSION: Stable examination since the immediate prior exam. Approximately 27 x 12 x 21 mm pituitary macro adenoma with left cavernous sinus invasion and chronic skull base remodeling. 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 Electronically Signed   By: Nelson Chimes M.D.   On: 09/23/2018 16:42     Assessment/Plan 1. Pituitary macroadenoma [facility])  Ms. Barcelo is clinically and radiographically stable today regarding her prolactinoma.  She should continue to take cabergoline via endocrinology.  For fatigue we recommended trial of gabapentin discontination given adequate coverage of other pain medications.  We appreciate the opportunity to participate in the care of Kimberly Larsen.   She should return in 6 months with a contrast enhanced CT head for evaluation.  All questions were answered. The patient knows to call the clinic with any problems, questions or concerns. No barriers to learning were detected.  The total time spent in the encounter was 25 minutes and more than 50% was on counseling and review of test results  Ventura Sellers, MD Medical Director of Neuro-Oncology [facility] 09/28/18 9:27 AM

## 2018-10-01 DIAGNOSIS — G894 Chronic pain syndrome: Secondary | ICD-10-CM | POA: Diagnosis not present

## 2018-10-01 DIAGNOSIS — Z79899 Other long term (current) drug therapy: Secondary | ICD-10-CM | POA: Diagnosis not present

## 2018-10-01 DIAGNOSIS — Z5181 Encounter for therapeutic drug level monitoring: Secondary | ICD-10-CM | POA: Diagnosis not present

## 2018-10-02 ENCOUNTER — Telehealth: Payer: Self-pay | Admitting: *Deleted

## 2018-10-02 NOTE — Telephone Encounter (Signed)
Patient called and schedule a follow in August instead on July due to surgery.

## 2018-10-08 DIAGNOSIS — E221 Hyperprolactinemia: Secondary | ICD-10-CM | POA: Diagnosis not present

## 2018-10-08 DIAGNOSIS — D352 Benign neoplasm of pituitary gland: Secondary | ICD-10-CM | POA: Diagnosis not present

## 2018-10-08 DIAGNOSIS — E038 Other specified hypothyroidism: Secondary | ICD-10-CM | POA: Diagnosis not present

## 2018-10-09 ENCOUNTER — Emergency Department (HOSPITAL_COMMUNITY): Payer: Medicare Other

## 2018-10-09 ENCOUNTER — Encounter (HOSPITAL_COMMUNITY): Payer: Self-pay | Admitting: Emergency Medicine

## 2018-10-09 ENCOUNTER — Other Ambulatory Visit: Payer: Self-pay

## 2018-10-09 ENCOUNTER — Emergency Department (HOSPITAL_COMMUNITY)
Admission: EM | Admit: 2018-10-09 | Discharge: 2018-10-09 | Disposition: A | Discharge status: Home / Self Care | Payer: Medicare Other | Attending: Emergency Medicine | Admitting: Emergency Medicine

## 2018-10-09 DIAGNOSIS — R5382 Chronic fatigue, unspecified: Secondary | ICD-10-CM | POA: Diagnosis not present

## 2018-10-09 DIAGNOSIS — R0609 Other forms of dyspnea: Secondary | ICD-10-CM | POA: Diagnosis not present

## 2018-10-09 DIAGNOSIS — R799 Abnormal finding of blood chemistry, unspecified: Secondary | ICD-10-CM | POA: Insufficient documentation

## 2018-10-09 DIAGNOSIS — I10 Essential (primary) hypertension: Secondary | ICD-10-CM | POA: Diagnosis not present

## 2018-10-09 DIAGNOSIS — R0600 Dyspnea, unspecified: Secondary | ICD-10-CM | POA: Diagnosis not present

## 2018-10-09 DIAGNOSIS — R531 Weakness: Secondary | ICD-10-CM | POA: Diagnosis not present

## 2018-10-09 DIAGNOSIS — Z20828 Contact with and (suspected) exposure to other viral communicable diseases: Secondary | ICD-10-CM | POA: Diagnosis not present

## 2018-10-09 DIAGNOSIS — R5383 Other fatigue: Secondary | ICD-10-CM | POA: Diagnosis not present

## 2018-10-09 DIAGNOSIS — R110 Nausea: Secondary | ICD-10-CM | POA: Diagnosis not present

## 2018-10-09 DIAGNOSIS — Z79899 Other long term (current) drug therapy: Secondary | ICD-10-CM | POA: Diagnosis not present

## 2018-10-09 LAB — T4, FREE: Free T4: 0.79 ng/dL (ref 0.61–1.12)

## 2018-10-09 LAB — BASIC METABOLIC PANEL
Anion gap: 9 (ref 5–15)
BUN: 20 mg/dL (ref 8–23)
CO2: 27 mmol/L (ref 22–32)
Calcium: 9 mg/dL (ref 8.9–10.3)
Chloride: 103 mmol/L (ref 98–111)
Creatinine, Ser: 0.92 mg/dL (ref 0.44–1.00)
GFR calc Af Amer: >60 mL/min (ref >60)
GFR calc non Af Amer: >60 mL/min (ref >60)
Glucose, Bld: 102 mg/dL — ABNORMAL HIGH (ref 70–99)
Potassium: 3.9 mmol/L (ref 3.5–5.1)
Sodium: 139 mmol/L (ref 135–145)

## 2018-10-09 LAB — POCT I-STAT EG7
Acid-Base Excess: 6 mmol/L — ABNORMAL HIGH (ref 0.0–2.0)
Bicarbonate: 31 mmol/L — ABNORMAL HIGH (ref 20.0–28.0)
Calcium, Ion: 1.15 mmol/L (ref 1.15–1.40)
HCT: 39 % (ref 36.0–46.0)
Hemoglobin: 13.3 g/dL (ref 12.0–15.0)
O2 Saturation: 94 %
Potassium: 4 mmol/L (ref 3.5–5.1)
Sodium: 139 mmol/L (ref 135–145)
TCO2: 32 mmol/L (ref 22–32)
pCO2, Ven: 46.5 mmHg (ref 44.0–60.0)
pH, Ven: 7.432 — ABNORMAL HIGH (ref 7.250–7.430)
pO2, Ven: 69 mmHg — ABNORMAL HIGH (ref 32.0–45.0)

## 2018-10-09 LAB — HEPATIC FUNCTION PANEL
ALT: 37 U/L (ref 0–44)
AST: 35 U/L (ref 15–41)
Albumin: 3.5 g/dL (ref 3.5–5.0)
Alkaline Phosphatase: 96 U/L (ref 38–126)
Bilirubin, Direct: 0.1 mg/dL (ref 0.0–0.2)
Indirect Bilirubin: 0.3 mg/dL (ref 0.3–0.9)
Total Bilirubin: 0.4 mg/dL (ref 0.3–1.2)
Total Protein: 6 g/dL — ABNORMAL LOW (ref 6.5–8.1)

## 2018-10-09 LAB — TROPONIN I: Troponin I: <0.03 ng/mL (ref <0.03)

## 2018-10-09 LAB — URINALYSIS, ROUTINE W REFLEX MICROSCOPIC
Bilirubin Urine: NEGATIVE
Glucose, UA: NEGATIVE mg/dL
Hgb urine dipstick: NEGATIVE
Ketones, ur: NEGATIVE mg/dL
Nitrite: NEGATIVE
Protein, ur: NEGATIVE mg/dL
Specific Gravity, Urine: 1.016 (ref 1.005–1.030)
pH: 7 (ref 5.0–8.0)

## 2018-10-09 LAB — CBC WITH DIFFERENTIAL/PLATELET
Abs Immature Granulocytes: 0.02 10*3/uL (ref 0.00–0.07)
Basophils Absolute: 0.1 10*3/uL (ref 0.0–0.1)
Basophils Relative: 1 %
Eosinophils Absolute: 0.1 10*3/uL (ref 0.0–0.5)
Eosinophils Relative: 2 %
HCT: 38.3 % (ref 36.0–46.0)
Hemoglobin: 12.1 g/dL (ref 12.0–15.0)
Immature Granulocytes: 1 %
Lymphocytes Relative: 26 %
Lymphs Abs: 1.1 10*3/uL (ref 0.7–4.0)
MCH: 30.4 pg (ref 26.0–34.0)
MCHC: 31.6 g/dL (ref 30.0–36.0)
MCV: 96.2 fL (ref 80.0–100.0)
Monocytes Absolute: 0.5 10*3/uL (ref 0.1–1.0)
Monocytes Relative: 11 %
Neutro Abs: 2.6 10*3/uL (ref 1.7–7.7)
Neutrophils Relative %: 59 %
Platelets: 162 10*3/uL (ref 150–400)
RBC: 3.98 MIL/uL (ref 3.87–5.11)
RDW: 13.6 % (ref 11.5–15.5)
WBC: 4.4 10*3/uL (ref 4.0–10.5)
nRBC: 0 % (ref 0.0–0.2)

## 2018-10-09 LAB — LIPASE, BLOOD: Lipase: 31 U/L (ref 11–51)

## 2018-10-09 LAB — TSH: TSH: 0.675 u[IU]/mL (ref 0.350–4.500)

## 2018-10-09 LAB — BRAIN NATRIURETIC PEPTIDE: B Natriuretic Peptide: 41.2 pg/mL (ref 0.0–100.0)

## 2018-10-09 MED ORDER — LACTATED RINGERS IV BOLUS
1000.0000 mL | Freq: Once | INTRAVENOUS | Status: AC
  Administered 2018-10-09: 1000 mL via INTRAVENOUS

## 2018-10-09 NOTE — ED Notes (Signed)
Patient verbalizes understanding of discharge instructions. Opportunity for questioning and answers were provided. Armband removed by staff, pt discharged from ED in wheelchair.  

## 2018-10-09 NOTE — ED Notes (Signed)
Pt ambulated to restroom with steady gait Tolerated well

## 2018-10-09 NOTE — ED Triage Notes (Addendum)
Pt coming from [facility] in [place]. Pt states she was told her EKG looked funny and needs to go straight to the hospital and have blood work done.  Pt denies chest pain  Pt denies SOB  Pt endorses posterior neck pain X1 week Pt endorses feeling exteremly fatigued X 2 weeks

## 2018-10-09 NOTE — Discharge Instructions (Signed)

## 2018-10-09 NOTE — ED Provider Notes (Signed)
[facility] EMERGENCY DEPARTMENT Provider Note   CSN: [id number] Arrival date & time: 10/09/18  1431    History   Chief Complaint Chief Complaint  Patient presents with   Abnormal Lab    HPI Kimberly Larsen is a 73 y.o. female.     The history is provided by the patient and medical records.  Abnormal Lab Weakness Severity:  Mild Onset quality:  Gradual Duration:  1 week Timing:  Intermittent Progression:  Worsening Chronicity:  New Context: decreased sleep and stress   Context: not alcohol use, not change in medication, not dehydration, not increased activity and not recent infection   Relieved by:  Nothing Worsened by:  Activity, standing and stress Ineffective treatments:  Rest, medication, pain relief, lying down, drinking fluids, eating and sleep Associated symptoms: abdominal pain, frequency, headaches, myalgias, nausea and shortness of breath   Associated symptoms: no chest pain, no cough, no diarrhea, no difficulty walking, no falls, no fever, no hematochezia, no loss of consciousness, no melena, no seizures and no vomiting   Risk factors: recent stressors   Risk factors: no congestive heart failure, no coronary artery disease, no heart disease and no new medications     Past Medical History:  Diagnosis Date   Anxiety    Arthritis    Back pain    Cancer (HCC)    endometrial cancer   DDD (degenerative disc disease), lumbar    Depression    Gastroparesis    History of kidney stones    HLD (hyperlipidemia)    Hx of sepsis 02/2014   DUE TO MULTIPLE KIDNEY STONES   Hypertension    Osteoporosis    Pneumonia    2014   UTI (urinary tract infection)     Patient Active Problem List   Diagnosis Date Noted   Genetic testing 12/17/2017   Chronic daily headache 10/23/2017   Pituitary macroadenoma ([place]) 07/23/2017   UTI (urinary tract infection) 07/10/2017   Near syncope 07/10/2017   Lactic acidosis 07/10/2017    Endometrial cancer ([place]) 01/10/2016   Severe sepsis with septic shock ([place]) 03/25/2014   AKI (acute kidney injury) ([place]) 03/25/2014   Benign essential HTN 03/25/2014   Obstructive uropathy 03/25/2014   Hyperlipidemia 03/25/2014   Acute postoperative respiratory failure ([place]) 03/25/2014   Chronic pain 02/23/2014   Fibromyalgia 09/08/2012   Insomnia 09/08/2012   Obesity (BMI 30-39.9) 09/02/2012   Expected blood loss anemia 09/02/2012   S/P right TKA 08/31/2012   HYPERTENSION, BENIGN SYSTEMIC 06/26/2006   NEPHROLITHIASIS 06/26/2006    Past Surgical History:  Procedure Laterality Date   BACK SURGERY  2013   CARPAL TUNNEL RELEASE     R hand   CYSTOSCOPY WITH URETEROSCOPY AND STENT PLACEMENT Bilateral 03/25/2014   Procedure: CYSTOSCOPY WITH URETEROSCOPY AND STENT PLACEMENT;  Surgeon: Raynelle Bring, MD;  Location: [facility];  Service: Urology;  Laterality: Bilateral;   CYSTOSCOPY WITH URETEROSCOPY AND STENT PLACEMENT Bilateral 04/18/2014   Procedure: CYSTOSCOPY WITH URETEROSCOPY AND STENT PLACEMENT,RETROGRADE;  Surgeon: Raynelle Bring, MD;  Location: [facility];  Service: Urology;  Laterality: Bilateral;   CYSTOSCOPY WITH URETEROSCOPY AND STENT PLACEMENT Right 05/30/2014   Procedure: CYSTOSCOPY WITH URETEROSCOPY AND STENT PLACEMENT;  Surgeon: Raynelle Bring, MD;  Location: [facility];  Service: Urology;  Laterality: Right;   CYSTOSCOPY/RETROGRADE/URETEROSCOPY Left 05/30/2014   Procedure: LEFT RETROGRADE;  Surgeon: Raynelle Bring, MD;  Location: [facility];  Service: Urology;  Laterality: Left;   EYE SURGERY     cataract surgery  bil   GANGLION CYST EXCISION     L ankle   HAMMERTOE RECONSTRUCTION WITH WEIL OSTEOTOMY Left 09/05/2016   Procedure: Second Metatarsal Weil Osteotomy and Hammertoe Correction;  Surgeon: Wylene Simmer, MD;  Location: [place];  Service: Orthopedics;  Laterality: Left;   HOLMIUM LASER APPLICATION Bilateral 70/96/2836   Procedure: HOLMIUM LASER  APPLICATION;  Surgeon: Raynelle Bring, MD;  Location: [facility];  Service: Urology;  Laterality: Bilateral;   JOINT REPLACEMENT     total knee   LITHOTRIPSY     LUMBAR FUSION  09/2011   METATARSAL OSTEOTOMY WITH BUNIONECTOMY Left 09/05/2016   Procedure: Left First Metatarsal Scarf Osteotomy, Modified McBride Bunion Correction;  Surgeon: Wylene Simmer, MD;  Location: [place];  Service: Orthopedics;  Laterality: Left;   RHINOPLASTY     ROBOTIC ASSISTED TOTAL HYSTERECTOMY WITH BILATERAL SALPINGO OOPHERECTOMY Bilateral 01/16/2016   Procedure: XI ROBOTIC ASSISTED TOTAL HYSTERECTOMY WITH BILATERAL SALPINGO OOPHORECTOMY AND BILATERAL PELVIC LYMPH NODE DISSECTION;  Surgeon: Everitt Amber, MD;  Location: [facility];  Service: Gynecology;  Laterality: Bilateral;   SPINAL CORD STIMULATOR INSERTION N/A 02/23/2014   Procedure: SPINAL CORD STIMULATOR PLACEMENT ;  Surgeon: Melina Schools, MD;  Location: [place];  Service: Orthopedics;  Laterality: N/A;   TONSILLECTOMY     TOTAL KNEE ARTHROPLASTY Right 08/31/2012   Procedure: RIGHT TOTAL KNEE ARTHROPLASTY;  Surgeon: Mauri Pole, MD;  Location: [facility];  Service: Orthopedics;  Laterality: Right;  with LMA   TUBAL LIGATION       OB History   No obstetric history on file.      Home Medications    Prior to Admission medications   Medication Sig Start Date End Date Taking? Authorizing Provider  ALPRAZolam (XANAX) 0.25 MG tablet Take 0.25 mg by mouth 3 (three) times daily as needed for anxiety.  05/30/17  Yes [provider]  aspirin EC 81 MG tablet Take 81 mg by mouth daily.   Yes [provider]  B Complex Vitamins (B COMPLEX PO) Take 1 tablet by mouth daily.   Yes [provider]  cabergoline (DOSTINEX) 0.5 MG tablet Take 1.5 mg by mouth See admin instructions. Patient takes tablets 1.5 mg on Tuesday and friday   Yes [provider]  Cholecalciferol (VITAMIN D3) 50 MCG (2000 UT) TABS Take 2,000 mg by mouth  daily at 12 noon.   Yes [provider]  DULoxetine (CYMBALTA) 30 MG capsule Take 90 mg by mouth daily.    Yes [provider]  fluticasone (FLONASE) 50 MCG/ACT nasal spray Place 1 spray into both nostrils daily.   Yes [provider]  gabapentin (NEURONTIN) 600 MG tablet Take 600 mg by mouth 3 (three) times daily.   Yes [provider]  levothyroxine (SYNTHROID, LEVOTHROID) 50 MCG tablet Take 50 mcg by mouth daily before breakfast.  10/12/17  Yes [provider]  Magnesium 500 MG TABS Take 250 mg by mouth daily.    Yes [provider]  Melatonin 10 MG CAPS Take 10 mg by mouth at bedtime.   Yes [provider]  metoprolol succinate (TOPROL-XL) 25 MG 24 hr tablet Take 25 mg by mouth daily.  06/15/14  Yes [provider]  montelukast (SINGULAIR) 10 MG tablet Take 10 mg by mouth at bedtime.  10/06/17  Yes [provider]  Multiple Vitamin (MULTIVITAMIN WITH MINERALS) TABS Take 1 tablet by mouth daily. 09/11/12  Yes Judeth Cornfield, NP  Multiple Vitamins-Minerals [facility] SKIN AND  NAILS FORMULA PO) Take 1 capsule by mouth daily.    Yes [provider]  naloxone (NARCAN) nasal spray 4 mg/0.1 mL Place 1 spray into the nose See admin instructions. Take by nasal route every 3 minutes until patient awakes or EMS arrives.   Yes [provider]  Omega-3 Fatty Acids (FISH OIL) 600 MG CAPS Take 1 capsule by mouth 2 (two) times daily. Patient taking differently: Take 1,000 mg by mouth 2 (two) times daily.  09/11/12  Yes Judeth Cornfield, NP  omeprazole (PRILOSEC) 20 MG capsule Take 1 capsule (20 mg total) by mouth daily. 11/15/17  Yes Khatri, Hina, PA-C  ondansetron (ZOFRAN) 4 MG tablet Take 1 tablet by mouth every 6 (six) hours as needed for nausea or vomiting.    Yes [provider]  oxyCODONE-acetaminophen (PERCOCET) 10-325 MG tablet Take 1 tablet by mouth 3 (three) times daily as needed for pain. 06/30/17  Yes [provider]  polyethylene glycol (MIRALAX / GLYCOLAX) packet Take 17 g by mouth 2 (two) times daily. Patient taking differently: Take 17 g by mouth 2 (two) times daily as needed for mild constipation.  07/12/17  Yes Sheikh, Omair Latif, DO  pravastatin (PRAVACHOL) 40 MG tablet Take 1 tablet (40 mg total) by mouth at bedtime. 09/11/12  Yes Judeth Cornfield, NP  Propylene Glycol (SYSTANE BALANCE) 0.6 % SOLN Place 1-2 drops into both eyes 4 (four) times daily.    Yes [provider]  tiZANidine (ZANAFLEX) 2 MG tablet Take 2 mg by mouth 2 (two) times a day. 09/08/18  Yes [provider]  traZODone (DESYREL) 100 MG tablet Take 100 mg by mouth at bedtime.   Yes [provider]    Family History Family History  Problem Relation Age of Onset   Stroke Mother    Emphysema Mother    COPD Mother    Stroke Father    Heart disease Father    Emphysema Father     Social History Social History   Tobacco Use   Smoking status: Never Smoker   Smokeless tobacco: Never Used  Substance Use Topics   Alcohol use: No   Drug use: No     Allergies   Augmentin [amoxicillin-pot clavulanate] and Wellbutrin [bupropion]   Review of Systems Review of Systems  Constitutional: Negative for fever.  Respiratory: Positive for shortness of breath. Negative for cough.   Cardiovascular: Negative for chest pain.  Gastrointestinal: Positive for abdominal pain and nausea. Negative for diarrhea, hematochezia, melena and vomiting.  Genitourinary: Positive for frequency.  Musculoskeletal: Positive for myalgias. Negative for falls.  Neurological: Positive for weakness and headaches. Negative for seizures and loss of consciousness.  All other systems reviewed and are negative.    Physical Exam Updated Vital Signs BP (!) 144/90 (BP Location: Right Arm)    Pulse 68    Temp 98.7 F (37.1 C) (Oral)    Resp 19    Ht 5\' 2"  (1.575 m)    Wt 94 kg    SpO2 98%    BMI 37.90 kg/m    Physical Exam Vitals signs and nursing note reviewed.  Constitutional:      Appearance: She is well-developed. She is not toxic-appearing.  HENT:     Head: Normocephalic and atraumatic.     Mouth/Throat:     Mouth: Mucous membranes are moist.     Comments: Uvula midline No obvious asymmetric palatine elevation No obvious swellings posterior oropharynx No obvious swellings appreciated on neck exam  Patient able to move neck in all directions without difficulty, tolerating secretions appropriately  Eyes:     Extraocular Movements: Extraocular movements intact.     Conjunctiva/sclera: Conjunctivae normal.     Pupils: Pupils are equal, round, and reactive to light.  Neck:     Musculoskeletal: Normal range of motion and neck supple. No muscular tenderness.  Cardiovascular:     Rate and Rhythm: Normal rate.  Pulmonary:     Effort: Pulmonary effort is normal. No respiratory distress.     Breath sounds: No stridor. No wheezing or rhonchi.  Abdominal:     Palpations: Abdomen is soft.     Tenderness: There is no abdominal tenderness. There is no guarding or rebound.     Comments: No abdominal tenderness, no overt signs of peritonitis  Musculoskeletal:     Right lower leg: No edema.     Left lower leg: No edema.  Skin:    General: Skin is warm and dry.     Capillary Refill: Capillary refill takes less than 2 seconds.     Findings: No rash.  Neurological:     Mental Status: She is alert and oriented to person, place, and time.      ED Treatments / Results  Labs (all labs ordered are listed, but only abnormal results are displayed) Labs Reviewed  BASIC METABOLIC PANEL - Abnormal; Notable for the following components:      Result Value   Glucose, Bld 102 (*)    All other components within normal limits  HEPATIC FUNCTION PANEL - Abnormal; Notable for the following components:   Total Protein 6.0 (*)    All other components within normal limits  URINALYSIS, ROUTINE W REFLEX  MICROSCOPIC - Abnormal; Notable for the following components:   APPearance HAZY (*)    Leukocytes,Ua TRACE (*)    Bacteria, UA RARE (*)    All other components within normal limits  POCT I-STAT EG7 - Abnormal; Notable for the following components:   pH, Ven 7.432 (*)    pO2, Ven 69.0 (*)    Bicarbonate 31.0 (*)    Acid-Base Excess 6.0 (*)    All other components within normal limits  NOVEL CORONAVIRUS, NAA (HOSPITAL ORDER, SEND-OUT TO REF LAB)  TSH  T4, FREE  LIPASE, BLOOD  BRAIN NATRIURETIC PEPTIDE  TROPONIN I  CBC WITH DIFFERENTIAL/PLATELET  CBC WITH DIFFERENTIAL/PLATELET  I-STAT VENOUS BLOOD GAS, ED    EKG EKG Interpretation  Date/Time:  Friday October 09 2018 15:50:52 EDT Ventricular Rate:  69 PR Interval:  164 QRS Duration: 91 QT Interval:  408 QTC Calculation: 438 R Axis:   69 Text Interpretation:  Sinus rhythm Consider left atrial enlargement Anteroseptal infarct, old no ischemic changes Confirmed by Theotis Burrow [phone]) on 10/09/2018 4:13:59 PM   Radiology Dg Chest 2 View  Result Date: 10/09/2018 CLINICAL DATA:  Nausea and fatigue for 1 week EXAM: CHEST - 2 VIEW COMPARISON:  07/09/2018 FINDINGS: Cardiac shadow is stable. Spinal stimulator is again seen and stable. The lungs are well aerated without focal infiltrate or sizable effusion. Degenerative changes of the thoracic spine are noted. IMPRESSION: No acute abnormality noted. Electronically Signed   By: Inez Catalina M.D.   On: 10/09/2018 16:20    Procedures Procedures (including critical care time)  Medications Ordered in ED Medications  lactated ringers bolus 1,000 mL (0 mLs Intravenous Stopped 10/09/18 1820)     Initial Impression / Assessment and Plan / ED Course  I have reviewed the  triage vital signs and the nursing notes.  Pertinent labs & imaging results that were available during my care of the patient were reviewed by me and considered in my medical decision making (see chart for details).         Medical Decision Making: Kimberly Larsen is a 73 y.o. female who presented to the ED today with after being seen at outside hospital clinic, abnormal EKG, intermittent fatigue over the last week.  Past medical history significant for hypertension, fibromyalgia, hyperlipidemia, endometrial cancer, urinary tract infections, prolactinoma, arthritis, depression, osteoporosis, history of thyroid disease Reviewed and confirmed nursing documentation for past medical history, family history, social history.  On my initial exam, the pt was calm, cooperative, conversant, follows commands appropriately, GCS 15, not tachycardic, not hypotensive, afebrile, no increased work of breathing or respiratory distress, no signs of impending respiratory failure.   Patient is having no active chest pain.  Less concerned about intermittent fatigue, intermittent shortness of breath, intermittent urinary symptoms, grossly just feeling like she is operating at "80%" instead of 100%. EKG changes on EKG done at outside clinic showed nonspecific ST segment changes 2, 3, aVF, artifact present, will repeat.  No concerning symptoms for ACS. The CXR assists in r/o Pneumonia, Pneumothorax, Esophageal Tears.  No focal lung findings suggestive of pneumonia or pneumothorax. The patient does not appear to have a Pulmonary Embolism based on the Wells Score and and there is no apparent asymmetric upper extremity or lower extremity edema/swelling suggestive of DVT. No obvious signs of volume overload on clinical exam, will obtain BNP, no history of heart failure Will obtain thyroid studies given patient's history of thyroid disease Will obtain CBC to assess for anemia, patient denies any blood in the cough, emesis, diarrhea, stool, urine Will obtain urine studies given intermittent urinary symptoms, history of UTIs Ingestion/overdose versus withdrawal versus intoxication seem less likely given history, no physical exam findings concerning  for ingestion or withdrawal Polypharmacy could be a potential etiology as patient is on multiple medications, no recent dosage changes, medication adjustments or new medications  EKG (my interpretation):      NS rhythm with a rate of  84.      QRS 92. QTc 444. PR 164.      Non specific ST segment changes in leads II, III, Avf, no obvious reciprocal changes, artifact present, will obtain repeat EKG      No acute changes suggestive of hyperkalemia.      No WPW, LQTS, or Brugada's Syndrome. When compared to previous ECGs from 20/8/19 change no new concerning changes found.  Repeat EKG shows no nonspecific ST segment changes in leads II, III, aVF  All radiology and laboratory studies reviewed independently and with my attending physician, agree with reading provided by radiologist unless otherwise noted.   Upon reassessing patient, patient was calm, resting comfortably, no new complaints White blood cells 4.4, hemoglobin 12.1, urinalysis showed no significant evidence of infection, BNP 41, COVID testing negative, pH 7.43, creatinine 0.92, troponin negative Based on the above findings, I believe patient is hemodynamically stable for discharge Patient educated about specific return precautions for given chief complaint and symptoms.  Patient educated about follow-up with PCP.  Patient expressed understanding of return precautions and need for follow-up.  Patient discharged. The above care was discussed with and agreed upon by my attending physician. Emergency Department Medication Summary:  Medications  lactated ringers bolus 1,000 mL (0 mLs Intravenous Stopped 10/09/18 1820)   Final Clinical Impressions(s) / ED  Diagnoses   Final diagnoses:  Weakness    ED Discharge Orders    None       Lonzo Candy, MD 10/09/18 1927    Rex Kras Wenda Overland, MD 10/11/18 2149

## 2018-10-10 LAB — NOVEL CORONAVIRUS, NAA (HOSP ORDER, SEND-OUT TO REF LAB; TAT 18-24 HRS): SARS-CoV-2, NAA: NOT DETECTED

## 2018-10-19 DIAGNOSIS — Z6839 Body mass index (BMI) 39.0-39.9, adult: Secondary | ICD-10-CM | POA: Diagnosis not present

## 2018-10-19 DIAGNOSIS — R5383 Other fatigue: Secondary | ICD-10-CM | POA: Diagnosis not present

## 2018-10-20 DIAGNOSIS — H02839 Dermatochalasis of unspecified eye, unspecified eyelid: Secondary | ICD-10-CM | POA: Diagnosis not present

## 2018-10-20 DIAGNOSIS — Z961 Presence of intraocular lens: Secondary | ICD-10-CM | POA: Diagnosis not present

## 2018-10-20 DIAGNOSIS — D443 Neoplasm of uncertain behavior of pituitary gland: Secondary | ICD-10-CM | POA: Diagnosis not present

## 2018-10-20 DIAGNOSIS — I10 Essential (primary) hypertension: Secondary | ICD-10-CM | POA: Diagnosis not present

## 2018-10-22 NOTE — H&P (Signed)
TOTAL KNEE ADMISSION H&P  Patient is being admitted for left total knee arthroplasty.  Subjective:  Chief Complaint:   Left knee primary OA / pain  HPI: Kimberly Larsen, 73 y.o. female, has a history of pain and functional disability in the left knee due to arthritis and has failed non-surgical conservative treatments for greater than 12 weeks to include NSAID's and/or analgesics and activity modification.  Onset of symptoms was gradual, starting < 1 year ago with gradually worsening course since that time. The patient noted prior procedures on the knee to include  arthroplasty on the right knee 2014.  Patient currently rates pain in the left knee(s) at 6 out of 10 with activity. Patient has worsening of pain with activity and weight bearing, pain that interferes with activities of daily living, pain with passive range of motion, crepitus and joint swelling.  Patient has evidence of periarticular osteophytes and joint space narrowing by imaging studies. There is no active infection.  Risks, benefits and expectations were discussed with the patient.  Risks including but not limited to the risk of anesthesia, blood clots, nerve damage, blood vessel damage, failure of the prosthesis, infection and up to and including death.  Patient understand the risks, benefits and expectations and wishes to proceed with surgery.   PCP: Ernestene Kiel, MD  D/C Plans:       Home   Post-op Meds:       No Rx given  Tranexamic Acid:      To be given - IV   Decadron:      Is to be given  FYI:      ASA  Oxy (10/325 tid prior to surgery)  Zanaflex  DME:   Pt equipment arranged  PT:   OPPT arranged  Pharmacy: [facility] [place] [facility] [address]    Patient Active Problem List   Diagnosis Date Noted  . Genetic testing 12/17/2017  . Chronic daily headache 10/23/2017  . Pituitary macroadenoma ([place]) 07/23/2017  . UTI (urinary tract infection) 07/10/2017  . Near syncope 07/10/2017  . Lactic acidosis 07/10/2017   . Endometrial cancer ([place]) 01/10/2016  . Severe sepsis with septic shock ([place]) 03/25/2014  . AKI (acute kidney injury) ([place]) 03/25/2014  . Benign essential HTN 03/25/2014  . Obstructive uropathy 03/25/2014  . Hyperlipidemia 03/25/2014  . Acute postoperative respiratory failure ([place]) 03/25/2014  . Chronic pain 02/23/2014  . Fibromyalgia 09/08/2012  . Insomnia 09/08/2012  . Obesity (BMI 30-39.9) 09/02/2012  . Expected blood loss anemia 09/02/2012  . S/P right TKA 08/31/2012  . HYPERTENSION, BENIGN SYSTEMIC 06/26/2006  . NEPHROLITHIASIS 06/26/2006   Past Medical History:  Diagnosis Date  . Anxiety   . Arthritis   . Back pain   . Cancer [facility])    endometrial cancer  . DDD (degenerative disc disease), lumbar   . Depression   . Gastroparesis   . History of kidney stones   . HLD (hyperlipidemia)   . Hx of sepsis 02/2014   DUE TO MULTIPLE KIDNEY STONES  . Hypertension   . Osteoporosis   . Pneumonia    2014  . UTI (urinary tract infection)     Past Surgical History:  Procedure Laterality Date  . BACK SURGERY  2013  . CARPAL TUNNEL RELEASE     R hand  . CYSTOSCOPY WITH URETEROSCOPY AND STENT PLACEMENT Bilateral 03/25/2014   Procedure: CYSTOSCOPY WITH URETEROSCOPY AND STENT PLACEMENT;  Surgeon: Raynelle Bring, MD;  Location: [facility];  Service: Urology;  Laterality: Bilateral;  .  CYSTOSCOPY WITH URETEROSCOPY AND STENT PLACEMENT Bilateral 04/18/2014   Procedure: CYSTOSCOPY WITH URETEROSCOPY AND STENT PLACEMENT,RETROGRADE;  Surgeon: Raynelle Bring, MD;  Location: [facility];  Service: Urology;  Laterality: Bilateral;  . CYSTOSCOPY WITH URETEROSCOPY AND STENT PLACEMENT Right 05/30/2014   Procedure: CYSTOSCOPY WITH URETEROSCOPY AND STENT PLACEMENT;  Surgeon: Raynelle Bring, MD;  Location: [facility];  Service: Urology;  Laterality: Right;  . CYSTOSCOPY/RETROGRADE/URETEROSCOPY Left 05/30/2014   Procedure: LEFT RETROGRADE;  Surgeon: Raynelle Bring, MD;  Location: [facility];  Service: Urology;  Laterality:  Left;  . EYE SURGERY     cataract surgery bil  . GANGLION CYST EXCISION     L ankle  . HAMMERTOE RECONSTRUCTION WITH WEIL OSTEOTOMY Left 09/05/2016   Procedure: Second Metatarsal Weil Osteotomy and Hammertoe Correction;  Surgeon: Wylene Simmer, MD;  Location: [place];  Service: Orthopedics;  Laterality: Left;  . HOLMIUM LASER APPLICATION Bilateral 69/[id number]/9528   Procedure: HOLMIUM LASER APPLICATION;  Surgeon: Raynelle Bring, MD;  Location: [facility];  Service: Urology;  Laterality: Bilateral;  . JOINT REPLACEMENT     total knee  . LITHOTRIPSY    . LUMBAR FUSION  09/2011  . METATARSAL OSTEOTOMY WITH BUNIONECTOMY Left 09/05/2016   Procedure: Left First Metatarsal Scarf Osteotomy, Modified McBride Bunion Correction;  Surgeon: Wylene Simmer, MD;  Location: [place];  Service: Orthopedics;  Laterality: Left;  . RHINOPLASTY    . ROBOTIC ASSISTED TOTAL HYSTERECTOMY WITH BILATERAL SALPINGO OOPHERECTOMY Bilateral 01/16/2016   Procedure: XI ROBOTIC ASSISTED TOTAL HYSTERECTOMY WITH BILATERAL SALPINGO OOPHORECTOMY AND BILATERAL PELVIC LYMPH NODE DISSECTION;  Surgeon: Everitt Amber, MD;  Location: [facility];  Service: Gynecology;  Laterality: Bilateral;  . SPINAL CORD STIMULATOR INSERTION N/A 02/23/2014   Procedure: SPINAL CORD STIMULATOR PLACEMENT ;  Surgeon: Melina Schools, MD;  Location: [place];  Service: Orthopedics;  Laterality: N/A;  . TONSILLECTOMY    . TOTAL KNEE ARTHROPLASTY Right 08/31/2012   Procedure: RIGHT TOTAL KNEE ARTHROPLASTY;  Surgeon: Mauri Pole, MD;  Location: [facility];  Service: Orthopedics;  Laterality: Right;  with LMA  . TUBAL LIGATION      No current facility-administered medications for this encounter.    Current Outpatient Medications  Medication Sig Dispense Refill Last Dose  . ALPRAZolam (XANAX) 0.25 MG tablet Take 0.25 mg by mouth 3 (three) times daily as needed for anxiety.    10/08/2018 at prn  . aspirin EC 81 MG tablet Take 81 mg by mouth daily.    10/09/2018 at Unknown time  . B Complex Vitamins (B COMPLEX PO) Take 1 tablet by mouth daily.   10/08/2018 at Unknown time  . cabergoline (DOSTINEX) 0.5 MG tablet Take 1.5 mg by mouth See admin instructions. Patient takes tablets 1.5 mg on Tuesday and friday   10/09/2018 at Unknown time  . Cholecalciferol (VITAMIN D3) 50 MCG (2000 UT) TABS Take 2,000 mg by mouth daily at 12 noon.   10/09/2018 at Unknown time  . DULoxetine (CYMBALTA) 30 MG capsule Take 90 mg by mouth daily.    10/09/2018 at Unknown time  . fluticasone (FLONASE) 50 MCG/ACT nasal spray Place 1 spray into both nostrils daily.   10/09/2018 at Unknown time  . gabapentin (NEURONTIN) 600 MG tablet Take 600 mg by mouth 3 (three) times daily.   10/09/2018 at Unknown time  . levothyroxine (SYNTHROID, LEVOTHROID) 50 MCG tablet Take 50 mcg by mouth daily before breakfast.   5 10/09/2018 at 0800  . Magnesium 500 MG TABS Take 250 mg by  mouth daily.    10/09/2018 at Unknown time  . Melatonin 10 MG CAPS Take 10 mg by mouth at bedtime.   10/08/2018 at Unknown time  . metoprolol succinate (TOPROL-XL) 25 MG 24 hr tablet Take 25 mg by mouth daily.   0 10/09/2018 at 0800  . montelukast (SINGULAIR) 10 MG tablet Take 10 mg by mouth at bedtime.   1 10/08/2018 at Unknown time  . Multiple Vitamin (MULTIVITAMIN WITH MINERALS) TABS Take 1 tablet by mouth daily.   10/09/2018 at Unknown time  . Multiple Vitamins-Minerals (HAIR SKIN AND NAILS FORMULA PO) Take 1 capsule by mouth daily.    10/09/2018 at Unknown time  . naloxone (NARCAN) nasal spray 4 mg/0.1 mL Place 1 spray into the nose See admin instructions. Take by nasal route every 3 minutes until patient awakes or EMS arrives.   unknown at prn  . Omega-3 Fatty Acids (FISH OIL) 600 MG CAPS Take 1 capsule by mouth 2 (two) times daily. (Patient taking differently: Take 1,000 mg by mouth 2 (two) times daily. ) 30 capsule  10/09/2018 at Unknown time  . omeprazole (PRILOSEC) 20 MG capsule Take 1 capsule (20 mg total) by mouth  daily. 30 capsule 0 10/09/2018 at Unknown time  . ondansetron (ZOFRAN) 4 MG tablet Take 1 tablet by mouth every 6 (six) hours as needed for nausea or vomiting.    10/09/2018 at prn  . oxyCODONE-acetaminophen (PERCOCET) 10-325 MG tablet Take 1 tablet by mouth 3 (three) times daily as needed for pain.   10/09/2018 at prn  . polyethylene glycol (MIRALAX / GLYCOLAX) packet Take 17 g by mouth 2 (two) times daily. (Patient taking differently: Take 17 g by mouth 2 (two) times daily as needed for mild constipation. ) 14 each 0 10/08/2018 at prn  . pravastatin (PRAVACHOL) 40 MG tablet Take 1 tablet (40 mg total) by mouth at bedtime. 30 tablet 0 10/08/2018 at Unknown time  . Propylene Glycol (SYSTANE BALANCE) 0.6 % SOLN Place 1-2 drops into both eyes 4 (four) times daily.    Past Week at Unknown time  . tiZANidine (ZANAFLEX) 2 MG tablet Take 2 mg by mouth 2 (two) times a day.   10/09/2018 at Unknown time  . traZODone (DESYREL) 100 MG tablet Take 100 mg by mouth at bedtime.   10/08/2018 at Unknown time   Allergies  Allergen Reactions  . Augmentin [Amoxicillin-Pot Clavulanate] Nausea Only  . Wellbutrin [Bupropion] Nausea Only    Social History   Tobacco Use  . Smoking status: Never Smoker  . Smokeless tobacco: Never Used  Substance Use Topics  . Alcohol use: No    Family History  Problem Relation Age of Onset  . Stroke Mother   . Emphysema Mother   . COPD Mother   . Stroke Father   . Heart disease Father   . Emphysema Father      Review of Systems  Constitutional: Positive for malaise/fatigue.  HENT: Negative.   Eyes: Negative.   Respiratory: Negative.   Cardiovascular: Negative.   Gastrointestinal: Positive for constipation and nausea.  Genitourinary: Negative.   Musculoskeletal: Positive for back pain and joint pain.  Skin: Negative.   Neurological: Negative.   Endo/Heme/Allergies: Negative.   Psychiatric/Behavioral: Positive for depression. The patient is nervous/anxious.      Objective:  Physical Exam  Constitutional: She is oriented to person, place, and time. She appears well-developed.  HENT:  Head: Normocephalic.  Eyes: Pupils are equal, round, and reactive to light.  Neck:  Neck supple. No JVD present. No tracheal deviation present. No thyromegaly present.  Cardiovascular: Normal rate, regular rhythm and intact distal pulses.  Respiratory: Effort normal and breath sounds normal. No respiratory distress. She has no wheezes.  GI: Soft. There is no abdominal tenderness. There is no guarding.  Musculoskeletal:     Left knee: She exhibits swelling and bony tenderness. She exhibits no ecchymosis, no deformity, no laceration and no erythema. Tenderness found.  Lymphadenopathy:    She has no cervical adenopathy.  Neurological: She is alert and oriented to person, place, and time.  Skin: Skin is warm and dry.  Psychiatric: She has a normal mood and affect.      Labs:  Estimated body mass index is 37.9 kg/m as calculated from the following:   Height as of 10/09/18: 5\' 2"  (1.575 m).   Weight as of 10/09/18: 94 kg.   Imaging Review Plain radiographs demonstrate severe degenerative joint disease of the left knee(s). The overall alignment is neutral. The bone quality appears to be good for age and reported activity level.      Assessment/Plan:  End stage arthritis, left knee   The patient history, physical examination, clinical judgment of the provider and imaging studies are consistent with end stage degenerative joint disease of the left knee(s) and total knee arthroplasty is deemed medically necessary. The treatment options including medical management, injection therapy arthroscopy and arthroplasty were discussed at length. The risks and benefits of total knee arthroplasty were presented and reviewed. The risks due to aseptic loosening, infection, stiffness, patella tracking problems, thromboembolic complications and other imponderables were  discussed. The patient acknowledged the explanation, agreed to proceed with the plan and consent was signed. Patient is being admitted for inpatient treatment for surgery, pain control, PT, OT, prophylactic antibiotics, VTE prophylaxis, progressive ambulation and ADL's and discharge planning. The patient is planning to be discharged home.     Patient's anticipated LOS is less than 2 midnights, meeting these requirements: - Younger than 63 - Lives within 1 hour of care - Has a competent adult at home to recover with post-op recover - NO history of  - Chronic pain requiring opiods  - Diabetes  - Coronary Artery Disease  - Heart failure  - Heart attack  - Stroke  - DVT/VTE  - Cardiac arrhythmia  - Respiratory Failure/COPD  - Renal failure  - Anemia  - Advanced Liver disease      [place] Pugh. Ijeoma Loor   PA-C  10/22/2018, 4:37 PM

## 2018-10-28 ENCOUNTER — Ambulatory Visit: Payer: Medicare Other | Admitting: Gynecologic Oncology

## 2018-10-29 DIAGNOSIS — M5136 Other intervertebral disc degeneration, lumbar region: Secondary | ICD-10-CM | POA: Diagnosis not present

## 2018-10-29 DIAGNOSIS — M961 Postlaminectomy syndrome, not elsewhere classified: Secondary | ICD-10-CM | POA: Diagnosis not present

## 2018-11-04 NOTE — Patient Instructions (Addendum)
YOU NEED TO HAVE A COVID 19 TEST ON_Friday 7/10______ @_2 :30______, THIS TEST MUST BE DONE BEFORE SURGERY, COME TO [place] ENTRANCE. ONCE YOUR COVID TEST IS COMPLETED, PLEASE BEGIN THE QUARANTINE INSTRUCTIONS AS OUTLINED IN YOUR HANDOUT.                Kimberly Larsen     Your procedure is scheduled on: 11-10-2018   Report to [facility] Main  Entrance  Report to [place] at 5:30  AM      Call this number if you have problems the morning of surgery [phone]     Remember: [place], NO [place].    NO SOLID FOOD AFTER MIDNIGHT THE NIGHT PRIOR TO SURGERY.  NOTHING BY MOUTH EXCEPT CLEAR LIQUIDS UNTIL  4:15 .  PLEASE FINISH ENSURE DRINK PER SURGEON ORDER 3 HOURS PRIOR TO SCHEDULED SURGERY TIME WHICH NEEDS TO BE COMPLETED AT 4:15 AM.   CLEAR LIQUID DIET   Foods Allowed                                                                     Foods Excluded  Coffee and tea, regular and decaf                             liquids that you cannot  Plain Jell-O in any flavor                                             see through such as: Fruit ices (not with fruit pulp)                                     milk, soups, orange juice  Iced Popsicles                                    All solid food Carbonated beverages, regular and diet                                    Cranberry, grape and apple juices Sports drinks like Gatorade Lightly seasoned clear broth or consume(fat free) Sugar, honey syrup  Sample Menu Breakfast                                Lunch                                     Supper Cranberry juice                    Beef broth  Chicken broth Jell-O                                     Grape juice                           Apple juice Coffee or tea                        Jell-O                                      Popsicle                                                 Coffee or tea                        Coffee or tea  _____________________________________________________________________    Take these medicines the morning of surgery with A SIP OF WATER: Gabapentin, Cymbalta, toprol XL, Flonase, Synthroid, prilosec                                You may not have any metal on your body including hair pins and              piercings               Do not wear jewelry, make-up, lotions, powders or perfumes, deodorant             Do not wear nail polish.  Do not shave  48 hours prior to surgery.                Do not bring valuables to the hospital. [place].  Contacts, dentures or bridgework may not be worn into surgery.      : _____________________________________________________________________             [facility] - Preparing for Surgery Before surgery, you can play an important role .  Because skin is not sterile, your skin needs to be as free of germs as possible.   You can reduce the number of germs on your skin by washing with CHG (chlorahexidine gluconate) soap before surgery.   CHG is an antiseptic cleaner which kills germs and bonds with the skin to continue killing germs even after washing. Please DO NOT use if you have an allergy to CHG or antibacterial soaps .  If your skin becomes reddened/irritated stop using the CHG and inform your nurse when you arrive at Short Stay. Do not shave (including legs and underarms) for at least 48 hours prior to the first CHG shower.   Please follow these instructions carefully:  1.  Shower with CHG Soap the night before surgery and the  morning of Surgery.  2.  If you choose to wash your hair, wash your hair first as usual with your  normal  shampoo.  3.  After you shampoo, rinse your hair  and body thoroughly to remove the  shampoo.                                        4.  Use CHG as you would any other liquid soap.   You can apply chg directly  to the skin and wash                       Gently with a scrungie or clean washcloth.  5.  Apply the CHG Soap to your body ONLY FROM THE NECK DOWN.   Do not use on face/ open                           Wound or open sores. Avoid contact with eyes, ears mouth and genitals (private parts).                       Wash face,  Genitals (private parts) with your normal soap.             6.  Wash thoroughly, paying special attention to the area where your surgery  will be performed.  7.  Thoroughly rinse your body with warm water from the neck down.  8.  DO NOT shower/wash with your normal soap after using and rinsing off  the CHG Soap.                9.  Pat yourself dry with a clean towel.            10.  Wear clean pajamas.            11.  Place clean sheets on your bed the night of your first shower and do not  sleep with pets.  Day of Surgery : Do not apply any lotions/deodorants the morning of surgery.  Please wear clean clothes to the hospital/surgery center.   FAILURE TO FOLLOW THESE INSTRUCTIONS MAY RESULT IN THE CANCELLATION OF YOUR SURGERY PATIENT SIGNATURE_________________________________  NURSE SIGNATURE__________________________________  ________________________________________________________________________   Kimberly Larsen  An incentive spirometer is a tool that can help keep your lungs clear and active. This tool measures how well you are filling your lungs with each breath. Taking long deep breaths may help reverse or decrease the chance of developing breathing (pulmonary) problems (especially infection) following:  A long period of time when you are unable to move or be active. BEFORE THE PROCEDURE   If the spirometer includes an indicator to show your best effort, your nurse or respiratory therapist will set it to a desired goal.  If possible, sit up straight or lean slightly forward. Try not to slouch.  Hold the incentive spirometer  in an upright position. INSTRUCTIONS FOR USE  1. Sit on the edge of your bed if possible, or sit up as far as you can in bed or on a chair. 2. Hold the incentive spirometer in an upright position. 3. Breathe out normally. 4. Place the mouthpiece in your mouth and seal your lips tightly around it. 5. Breathe in slowly and as deeply as possible, raising the piston or the ball toward the top of the column. 6. Hold your breath for 3-5 seconds or for as long as possible. Allow the piston or ball to fall to the bottom of the column. 7. Remove the mouthpiece from  your mouth and breathe out normally. 8. Rest for a few seconds and repeat Steps 1 through 7 at least 10 times every 1-2 hours when you are awake. Take your time and take a few normal breaths between deep breaths. 9. The spirometer may include an indicator to show your best effort. Use the indicator as a goal to work toward during each repetition. 10. After each set of 10 deep breaths, practice coughing to be sure your lungs are clear. If you have an incision (the cut made at the time of surgery), support your incision when coughing by placing a pillow or rolled up towels firmly against it. Once you are able to get out of bed, walk around indoors and cough well. You may stop using the incentive spirometer when instructed by your caregiver.  RISKS AND COMPLICATIONS  Take your time so you do not get dizzy or light-headed.  If you are in pain, you may need to take or ask for pain medication before doing incentive spirometry. It is harder to take a deep breath if you are having pain. AFTER USE  Rest and breathe slowly and easily.  It can be helpful to keep track of a log of your progress. Your caregiver can provide you with a simple table to help with this. If you are using the spirometer at home, follow these instructions: [place] IF:   You are having difficultly using the spirometer.  You have trouble using the spirometer as  often as instructed.  Your pain medication is not giving enough relief while using the spirometer.  You develop fever of 100.5 F (38.1 C) or higher. SEEK IMMEDIATE MEDICAL CARE IF:   You cough up bloody sputum that had not been present before.  You develop fever of 102 F (38.9 C) or greater.  You develop worsening pain at or near the incision site. MAKE SURE YOU:   Understand these instructions.  Will watch your condition.  Will get help right away if you are not doing well or get worse. Document Released: 08/26/2006 Document Revised: 07/08/2011 Document Reviewed: 10/27/2006 Exit[employer] Patient Information 2014 Exit[employer], [place].   ________________________________________________________________________  WHAT IS A BLOOD TRANSFUSION? Blood Transfusion Information  A transfusion is the replacement of blood or some of its parts. Blood is made up of multiple cells which provide different functions.  Red blood cells carry oxygen and are used for blood loss replacement.  White blood cells fight against infection.  Platelets control bleeding.  Plasma helps clot blood.  Other blood products are available for specialized needs, such as hemophilia or other clotting disorders. BEFORE THE TRANSFUSION  Who gives blood for transfusions?   Healthy volunteers who are fully evaluated to make sure their blood is safe. This is blood bank blood. Transfusion therapy is the safest it has ever been in the practice of medicine. Before blood is taken from a donor, a complete history is taken to make sure that person has no history of diseases nor engages in risky social behavior (examples are intravenous drug use or sexual activity with multiple partners). The donor's travel history is screened to minimize risk of transmitting infections, such as malaria. The donated blood is tested for signs of infectious diseases, such as HIV and hepatitis. The blood is then tested to be sure it is compatible with  you in order to minimize the chance of a transfusion reaction. If you or a relative donates blood, this is often done in anticipation of surgery and is  not appropriate for emergency situations. It takes many days to process the donated blood. RISKS AND COMPLICATIONS Although transfusion therapy is very safe and saves many lives, the main dangers of transfusion include:   Getting an infectious disease.  Developing a transfusion reaction. This is an allergic reaction to something in the blood you were given. Every precaution is taken to prevent this. The decision to have a blood transfusion has been considered carefully by your caregiver before blood is given. Blood is not given unless the benefits outweigh the risks. AFTER THE TRANSFUSION  Right after receiving a blood transfusion, you will usually feel much better and more energetic. This is especially true if your red blood cells have gotten low (anemic). The transfusion raises the level of the red blood cells which carry oxygen, and this usually causes an energy increase.  The nurse administering the transfusion will monitor you carefully for complications. HOME CARE INSTRUCTIONS  No special instructions are needed after a transfusion. You may find your energy is better. Speak with your caregiver about any limitations on activity for underlying diseases you may have. SEEK MEDICAL CARE IF:   Your condition is not improving after your transfusion.  You develop redness or irritation at the intravenous (IV) site. SEEK IMMEDIATE MEDICAL CARE IF:  Any of the following symptoms occur over the next 12 hours:  Shaking chills.  You have a temperature by mouth above 102 F (38.9 C), not controlled by medicine.  Chest, back, or muscle pain.  People around you feel you are not acting correctly or are confused.  Shortness of breath or difficulty breathing.  Dizziness and fainting.  You get a rash or develop hives.  You have a decrease in  urine output.  Your urine turns a dark color or changes to pink, red, or brown. Any of the following symptoms occur over the next 10 days:  You have a temperature by mouth above 102 F (38.9 C), not controlled by medicine.  Shortness of breath.  Weakness after normal activity.  The white part of the eye turns yellow (jaundice).  You have a decrease in the amount of urine or are urinating less often.  Your urine turns a dark color or changes to pink, red, or brown. Document Released: 04/12/2000 Document Revised: 07/08/2011 Document Reviewed: 11/30/2007 [facility] Patient Information 2014 [place], [place].  _______________________________________________________________________

## 2018-11-04 NOTE — Progress Notes (Signed)
EKG 10-09-18 Epic ECHO 07-11-17 Epic CHEST XRAY 10-09-18 EPIC

## 2018-11-06 ENCOUNTER — Other Ambulatory Visit (HOSPITAL_COMMUNITY)
Admission: RE | Admit: 2018-11-06 | Discharge: 2018-11-06 | Disposition: A | Discharge status: Home / Self Care | Payer: Medicare Other | Source: Ambulatory Visit | Attending: Orthopedic Surgery | Admitting: Orthopedic Surgery

## 2018-11-06 ENCOUNTER — Encounter (HOSPITAL_COMMUNITY): Payer: Self-pay

## 2018-11-06 ENCOUNTER — Other Ambulatory Visit: Payer: Self-pay

## 2018-11-06 ENCOUNTER — Encounter (HOSPITAL_COMMUNITY)
Admission: RE | Admit: 2018-11-06 | Discharge: 2018-11-06 | Disposition: A | Discharge status: Home / Self Care | Payer: Medicare Other | Source: Ambulatory Visit | Attending: Orthopedic Surgery | Admitting: Orthopedic Surgery

## 2018-11-06 DIAGNOSIS — Z1159 Encounter for screening for other viral diseases: Secondary | ICD-10-CM | POA: Diagnosis not present

## 2018-11-06 DIAGNOSIS — Z01812 Encounter for preprocedural laboratory examination: Secondary | ICD-10-CM | POA: Diagnosis not present

## 2018-11-06 DIAGNOSIS — M1712 Unilateral primary osteoarthritis, left knee: Secondary | ICD-10-CM | POA: Insufficient documentation

## 2018-11-06 LAB — BASIC METABOLIC PANEL
Anion gap: 8 (ref 5–15)
BUN: 19 mg/dL (ref 8–23)
CO2: 29 mmol/L (ref 22–32)
Calcium: 9.5 mg/dL (ref 8.9–10.3)
Chloride: 103 mmol/L (ref 98–111)
Creatinine, Ser: 0.83 mg/dL (ref 0.44–1.00)
GFR calc Af Amer: >60 mL/min (ref >60)
GFR calc non Af Amer: >60 mL/min (ref >60)
Glucose, Bld: 107 mg/dL — ABNORMAL HIGH (ref 70–99)
Potassium: 4.1 mmol/L (ref 3.5–5.1)
Sodium: 140 mmol/L (ref 135–145)

## 2018-11-06 LAB — SURGICAL PCR SCREEN
MRSA, PCR: NEGATIVE
Staphylococcus aureus: NEGATIVE

## 2018-11-06 LAB — CBC
HCT: 44.2 % (ref 36.0–46.0)
Hemoglobin: 13.5 g/dL (ref 12.0–15.0)
MCH: 30.1 pg (ref 26.0–34.0)
MCHC: 30.5 g/dL (ref 30.0–36.0)
MCV: 98.4 fL (ref 80.0–100.0)
Platelets: 269 10*3/uL (ref 150–400)
RBC: 4.49 MIL/uL (ref 3.87–5.11)
RDW: 13.7 % (ref 11.5–15.5)
WBC: 7.7 10*3/uL (ref 4.0–10.5)
nRBC: 0 % (ref 0.0–0.2)

## 2018-11-06 NOTE — Progress Notes (Signed)
Konrad Felix PA  Patient stopped ASA and supplements 7/7 per Dr. Aurea Graff office. Dr. Iven Finn is her PCP and manages her medications

## 2018-11-07 LAB — SARS CORONAVIRUS 2 (TAT 6-24 HRS): SARS Coronavirus 2: NEGATIVE

## 2018-11-09 NOTE — Anesthesia Preprocedure Evaluation (Addendum)
Anesthesia Evaluation  Patient identified by MRN, date of birth, ID band Patient awake    Reviewed: Allergy & Precautions, NPO status , Patient's Chart, lab work & pertinent test results, reviewed documented beta blocker date and time   History of Anesthesia Complications Negative for: history of anesthetic complications  Airway Mallampati: II  TM Distance: >3 FB Neck ROM: Full    Dental no notable dental hx.    Pulmonary neg pulmonary ROS,    Pulmonary exam normal        Cardiovascular hypertension, Pt. on home beta blockers and Pt. on medications Normal cardiovascular exam  TTE 06/2017: mild concentric hypertrophy, EF 55-60%, mild MR, mild TR, PASP moderately increased (85mmHg)     Neuro/Psych  Headaches, Anxiety Depression Spinal cord stimulator in place, images reviewed. S/P L3-L5 decompression, posterior lateral arthrodesis L3-4    GI/Hepatic Neg liver ROS, GERD  Medicated and Controlled,Gastroparesis   Endo/Other  Hypothyroidism   Renal/GU negative Renal ROS     Musculoskeletal  (+) Arthritis , Fibromyalgia -, narcotic dependent  Abdominal   Peds  Hematology negative hematology ROS (+)   Anesthesia Other Findings Day of surgery medications reviewed with the patient.  Reproductive/Obstetrics                           Anesthesia Physical Anesthesia Plan  ASA: III  Anesthesia Plan: Spinal   Post-op Pain Management:  Regional for Post-op pain   Induction:   PONV Risk Score and Plan: 3 and Treatment may vary due to age or medical condition, Ondansetron, Propofol infusion and Dexamethasone  Airway Management Planned: Natural Airway and Simple Face Mask  Additional Equipment:   Intra-op Plan:   Post-operative Plan:   Informed Consent: I have reviewed the patients History and Physical, chart, labs and discussed the procedure including the risks, benefits and alternatives for the  proposed anesthesia with the patient or authorized representative who has indicated his/her understanding and acceptance.     Dental advisory given  Plan Discussed with: CRNA  Anesthesia Plan Comments:        Anesthesia Quick Evaluation

## 2018-11-10 ENCOUNTER — Encounter (HOSPITAL_COMMUNITY): Payer: Self-pay | Admitting: *Deleted

## 2018-11-10 ENCOUNTER — Other Ambulatory Visit: Payer: Self-pay

## 2018-11-10 ENCOUNTER — Observation Stay (HOSPITAL_COMMUNITY)
Admission: RE | Admit: 2018-11-10 | Discharge: 2018-11-11 | Disposition: A | Discharge status: Home / Self Care | Payer: Medicare Other | Attending: Orthopedic Surgery | Admitting: Orthopedic Surgery

## 2018-11-10 ENCOUNTER — Inpatient Hospital Stay (HOSPITAL_COMMUNITY): Payer: Medicare Other | Admitting: Anesthesiology

## 2018-11-10 ENCOUNTER — Encounter (HOSPITAL_COMMUNITY)
Admission: RE | Disposition: A | Discharge status: Home / Self Care | Payer: Self-pay | Source: Home / Self Care | Attending: Orthopedic Surgery

## 2018-11-10 ENCOUNTER — Inpatient Hospital Stay (HOSPITAL_COMMUNITY): Payer: Medicare Other | Admitting: Physician Assistant

## 2018-11-10 DIAGNOSIS — I10 Essential (primary) hypertension: Secondary | ICD-10-CM | POA: Insufficient documentation

## 2018-11-10 DIAGNOSIS — Z96652 Presence of left artificial knee joint: Secondary | ICD-10-CM

## 2018-11-10 DIAGNOSIS — K219 Gastro-esophageal reflux disease without esophagitis: Secondary | ICD-10-CM | POA: Diagnosis not present

## 2018-11-10 DIAGNOSIS — M1712 Unilateral primary osteoarthritis, left knee: Principal | ICD-10-CM | POA: Insufficient documentation

## 2018-11-10 DIAGNOSIS — M549 Dorsalgia, unspecified: Secondary | ICD-10-CM | POA: Insufficient documentation

## 2018-11-10 DIAGNOSIS — Z79891 Long term (current) use of opiate analgesic: Secondary | ICD-10-CM | POA: Diagnosis not present

## 2018-11-10 DIAGNOSIS — C541 Malignant neoplasm of endometrium: Secondary | ICD-10-CM | POA: Diagnosis not present

## 2018-11-10 DIAGNOSIS — Z79899 Other long term (current) drug therapy: Secondary | ICD-10-CM | POA: Insufficient documentation

## 2018-11-10 DIAGNOSIS — Z8542 Personal history of malignant neoplasm of other parts of uterus: Secondary | ICD-10-CM | POA: Insufficient documentation

## 2018-11-10 DIAGNOSIS — G8929 Other chronic pain: Secondary | ICD-10-CM | POA: Insufficient documentation

## 2018-11-10 DIAGNOSIS — Z7982 Long term (current) use of aspirin: Secondary | ICD-10-CM | POA: Insufficient documentation

## 2018-11-10 DIAGNOSIS — F329 Major depressive disorder, single episode, unspecified: Secondary | ICD-10-CM | POA: Diagnosis not present

## 2018-11-10 DIAGNOSIS — Z9682 Presence of neurostimulator: Secondary | ICD-10-CM | POA: Insufficient documentation

## 2018-11-10 DIAGNOSIS — E669 Obesity, unspecified: Secondary | ICD-10-CM | POA: Diagnosis present

## 2018-11-10 DIAGNOSIS — E039 Hypothyroidism, unspecified: Secondary | ICD-10-CM | POA: Diagnosis not present

## 2018-11-10 DIAGNOSIS — F419 Anxiety disorder, unspecified: Secondary | ICD-10-CM | POA: Diagnosis not present

## 2018-11-10 DIAGNOSIS — E785 Hyperlipidemia, unspecified: Secondary | ICD-10-CM | POA: Diagnosis not present

## 2018-11-10 DIAGNOSIS — Z79890 Hormone replacement therapy (postmenopausal): Secondary | ICD-10-CM | POA: Diagnosis not present

## 2018-11-10 DIAGNOSIS — G8918 Other acute postprocedural pain: Secondary | ICD-10-CM | POA: Diagnosis not present

## 2018-11-10 DIAGNOSIS — Z96651 Presence of right artificial knee joint: Secondary | ICD-10-CM | POA: Insufficient documentation

## 2018-11-10 DIAGNOSIS — M797 Fibromyalgia: Secondary | ICD-10-CM | POA: Diagnosis not present

## 2018-11-10 HISTORY — PX: TOTAL KNEE ARTHROPLASTY: SHX125

## 2018-11-10 HISTORY — DX: Presence of left artificial knee joint: Z96.652

## 2018-11-10 LAB — TYPE AND SCREEN
ABO/RH(D): O POS
Antibody Screen: NEGATIVE

## 2018-11-10 SURGERY — ARTHROPLASTY, KNEE, TOTAL
Anesthesia: Spinal | Site: Knee | Laterality: Left

## 2018-11-10 MED ORDER — HYDROMORPHONE HCL 1 MG/ML IJ SOLN
INTRAMUSCULAR | Status: AC
  Filled 2018-11-10: qty 1

## 2018-11-10 MED ORDER — 0.9 % SODIUM CHLORIDE (POUR BTL) OPTIME
TOPICAL | Status: DC | PRN
  Administered 2018-11-10: 1000 mL

## 2018-11-10 MED ORDER — ONDANSETRON HCL 4 MG/2ML IJ SOLN: 4.0000 mg | Freq: Four times a day (QID) | INTRAMUSCULAR | Status: DC | PRN

## 2018-11-10 MED ORDER — PRAVASTATIN SODIUM 20 MG PO TABS
40.0000 mg | ORAL_TABLET | Freq: Every day | ORAL | Status: DC
  Administered 2018-11-10: 40 mg via ORAL
  Filled 2018-11-10: qty 2

## 2018-11-10 MED ORDER — BUPIVACAINE-EPINEPHRINE (PF) 0.25% -1:200000 IJ SOLN
INTRAMUSCULAR | Status: DC | PRN
  Administered 2018-11-10: 30 mL

## 2018-11-10 MED ORDER — DEXAMETHASONE SODIUM PHOSPHATE 10 MG/ML IJ SOLN
10.0000 mg | Freq: Once | INTRAMUSCULAR | Status: AC
  Administered 2018-11-11: 10 mg via INTRAVENOUS
  Filled 2018-11-10: qty 1

## 2018-11-10 MED ORDER — PROPOFOL 10 MG/ML IV BOLUS
INTRAVENOUS | Status: AC
  Filled 2018-11-10: qty 60

## 2018-11-10 MED ORDER — PHENYLEPHRINE HCL (PRESSORS) 10 MG/ML IV SOLN
INTRAVENOUS | Status: DC | PRN
  Administered 2018-11-10: 120 ug via INTRAVENOUS
  Administered 2018-11-10 (×2): 80 ug via INTRAVENOUS

## 2018-11-10 MED ORDER — TRANEXAMIC ACID-NACL 1000-0.7 MG/100ML-% IV SOLN
1000.0000 mg | Freq: Once | INTRAVENOUS | Status: AC
  Administered 2018-11-10: 1000 mg via INTRAVENOUS
  Filled 2018-11-10: qty 100

## 2018-11-10 MED ORDER — METOPROLOL SUCCINATE ER 25 MG PO TB24
25.0000 mg | ORAL_TABLET | Freq: Every day | ORAL | Status: DC
  Administered 2018-11-11: 25 mg via ORAL
  Filled 2018-11-10: qty 1

## 2018-11-10 MED ORDER — HYDROMORPHONE HCL 1 MG/ML IJ SOLN
INTRAMUSCULAR | Status: AC
  Administered 2018-11-10: 0.5 mg via INTRAVENOUS
  Filled 2018-11-10: qty 1

## 2018-11-10 MED ORDER — OXYCODONE HCL 5 MG PO TABS
10.0000 mg | ORAL_TABLET | Freq: Once | ORAL | Status: AC | PRN
  Administered 2018-11-10: 10 mg via ORAL

## 2018-11-10 MED ORDER — KETAMINE HCL 10 MG/ML IJ SOLN
INTRAMUSCULAR | Status: DC | PRN
  Administered 2018-11-10: 30 mg via INTRAVENOUS

## 2018-11-10 MED ORDER — MIDAZOLAM HCL 2 MG/2ML IJ SOLN
INTRAMUSCULAR | Status: AC
  Filled 2018-11-10: qty 2

## 2018-11-10 MED ORDER — OMEPRAZOLE 20 MG PO CPDR
20.0000 mg | DELAYED_RELEASE_CAPSULE | Freq: Every day | ORAL | Status: DC
  Administered 2018-11-11: 20 mg via ORAL
  Filled 2018-11-10: qty 1

## 2018-11-10 MED ORDER — OXYCODONE HCL 5 MG PO TABS: 5.0000 mg | ORAL_TABLET | Freq: Once | ORAL | Status: DC | PRN

## 2018-11-10 MED ORDER — OXYCODONE HCL 5 MG PO TABS
ORAL_TABLET | ORAL | Status: AC
  Filled 2018-11-10: qty 2

## 2018-11-10 MED ORDER — SODIUM CHLORIDE 0.9 % IV SOLN
INTRAVENOUS | Status: DC | PRN
  Administered 2018-11-10: 08:00:00 50 ug/min via INTRAVENOUS

## 2018-11-10 MED ORDER — LIDOCAINE 2% (20 MG/ML) 5 ML SYRINGE
INTRAMUSCULAR | Status: AC
  Filled 2018-11-10: qty 5

## 2018-11-10 MED ORDER — LEVOTHYROXINE SODIUM 50 MCG PO TABS
50.0000 ug | ORAL_TABLET | Freq: Every day | ORAL | Status: DC
  Administered 2018-11-11: 50 ug via ORAL
  Filled 2018-11-10: qty 1

## 2018-11-10 MED ORDER — SODIUM CHLORIDE 0.9 % IR SOLN
Status: DC | PRN
  Administered 2018-11-10: 1000 mL

## 2018-11-10 MED ORDER — OXYCODONE HCL 5 MG/5ML PO SOLN: 5.0000 mg | Freq: Once | ORAL | Status: DC | PRN

## 2018-11-10 MED ORDER — ONDANSETRON HCL 4 MG/2ML IJ SOLN
INTRAMUSCULAR | Status: DC | PRN
  Administered 2018-11-10: 4 mg via INTRAVENOUS

## 2018-11-10 MED ORDER — CEFAZOLIN SODIUM-DEXTROSE 2-4 GM/100ML-% IV SOLN
2.0000 g | INTRAVENOUS | Status: AC
  Administered 2018-11-10: 2 g via INTRAVENOUS
  Filled 2018-11-10: qty 100

## 2018-11-10 MED ORDER — SODIUM CHLORIDE 0.9 % IV SOLN
INTRAVENOUS | Status: DC
  Administered 2018-11-10 – 2018-11-11 (×2): via INTRAVENOUS

## 2018-11-10 MED ORDER — LACTATED RINGERS IV SOLN
INTRAVENOUS | Status: DC | PRN
  Administered 2018-11-10 (×2): via INTRAVENOUS

## 2018-11-10 MED ORDER — EPHEDRINE 5 MG/ML INJ
INTRAVENOUS | Status: AC
  Filled 2018-11-10: qty 10

## 2018-11-10 MED ORDER — ACETAMINOPHEN 10 MG/ML IV SOLN
INTRAVENOUS | Status: AC
  Filled 2018-11-10: qty 100

## 2018-11-10 MED ORDER — DOCUSATE SODIUM 100 MG PO CAPS
100.0000 mg | ORAL_CAPSULE | Freq: Two times a day (BID) | ORAL | Status: DC
  Administered 2018-11-10 – 2018-11-11 (×3): 100 mg via ORAL
  Filled 2018-11-10 (×3): qty 1

## 2018-11-10 MED ORDER — MONTELUKAST SODIUM 10 MG PO TABS
10.0000 mg | ORAL_TABLET | Freq: Every day | ORAL | Status: DC
  Administered 2018-11-10: 10 mg via ORAL
  Filled 2018-11-10: qty 1

## 2018-11-10 MED ORDER — PHENOL 1.4 % MT LIQD: 1.0000 | OROMUCOSAL | Status: DC | PRN

## 2018-11-10 MED ORDER — BUPIVACAINE-EPINEPHRINE (PF) 0.5% -1:200000 IJ SOLN
INTRAMUSCULAR | Status: DC | PRN
  Administered 2018-11-10: 15 mL via PERINEURAL

## 2018-11-10 MED ORDER — BISACODYL 10 MG RE SUPP: 10.0000 mg | Freq: Every day | RECTAL | Status: DC | PRN

## 2018-11-10 MED ORDER — OXYCODONE HCL 5 MG PO TABS: 5.0000 mg | ORAL_TABLET | ORAL | Status: DC | PRN

## 2018-11-10 MED ORDER — ALPRAZOLAM 0.25 MG PO TABS: 0.2500 mg | ORAL_TABLET | Freq: Three times a day (TID) | ORAL | Status: DC | PRN

## 2018-11-10 MED ORDER — SUGAMMADEX SODIUM 200 MG/2ML IV SOLN
INTRAVENOUS | Status: DC | PRN
  Administered 2018-11-10: 200 mg via INTRAVENOUS

## 2018-11-10 MED ORDER — CHLORHEXIDINE GLUCONATE 4 % EX LIQD: 60.0000 mL | Freq: Once | CUTANEOUS | Status: DC

## 2018-11-10 MED ORDER — ARTIFICIAL TEARS OP OINT
TOPICAL_OINTMENT | OPHTHALMIC | Status: AC
  Filled 2018-11-10: qty 3.5

## 2018-11-10 MED ORDER — METOCLOPRAMIDE HCL 5 MG/ML IJ SOLN: 5.0000 mg | Freq: Three times a day (TID) | INTRAMUSCULAR | Status: DC | PRN

## 2018-11-10 MED ORDER — DIPHENHYDRAMINE HCL 12.5 MG/5ML PO ELIX: 12.5000 mg | ORAL_SOLUTION | ORAL | Status: DC | PRN

## 2018-11-10 MED ORDER — ACETAMINOPHEN 10 MG/ML IV SOLN
1000.0000 mg | Freq: Once | INTRAVENOUS | Status: DC | PRN
  Administered 2018-11-10: 1000 mg via INTRAVENOUS

## 2018-11-10 MED ORDER — HYDROMORPHONE HCL 2 MG/ML IJ SOLN
INTRAMUSCULAR | Status: AC
  Filled 2018-11-10: qty 1

## 2018-11-10 MED ORDER — HYDROMORPHONE HCL 1 MG/ML IJ SOLN
INTRAMUSCULAR | Status: DC | PRN
  Administered 2018-11-10: 0.5 mg via INTRAVENOUS
  Administered 2018-11-10: 1 mg via INTRAVENOUS
  Administered 2018-11-10: 0.5 mg via INTRAVENOUS

## 2018-11-10 MED ORDER — POLYETHYLENE GLYCOL 3350 17 G PO PACK
17.0000 g | PACK | Freq: Two times a day (BID) | ORAL | Status: DC
  Administered 2018-11-10 – 2018-11-11 (×2): 17 g via ORAL
  Filled 2018-11-10 (×2): qty 1

## 2018-11-10 MED ORDER — MAGNESIUM CITRATE PO SOLN: 1.0000 | Freq: Once | ORAL | Status: DC | PRN

## 2018-11-10 MED ORDER — ROCURONIUM BROMIDE 100 MG/10ML IV SOLN
INTRAVENOUS | Status: DC | PRN
  Administered 2018-11-10: 70 mg via INTRAVENOUS

## 2018-11-10 MED ORDER — KETOROLAC TROMETHAMINE 30 MG/ML IJ SOLN
INTRAMUSCULAR | Status: DC | PRN
  Administered 2018-11-10: 30 mg via INTRAMUSCULAR

## 2018-11-10 MED ORDER — HYDROMORPHONE HCL 1 MG/ML IJ SOLN
0.2500 mg | INTRAMUSCULAR | Status: DC | PRN
  Administered 2018-11-10 (×3): 0.5 mg via INTRAVENOUS

## 2018-11-10 MED ORDER — ONDANSETRON HCL 4 MG/2ML IJ SOLN
INTRAMUSCULAR | Status: AC
  Filled 2018-11-10: qty 2

## 2018-11-10 MED ORDER — DEXAMETHASONE SODIUM PHOSPHATE 10 MG/ML IJ SOLN
10.0000 mg | Freq: Once | INTRAMUSCULAR | Status: AC
  Administered 2018-11-10: 10 mg via INTRAVENOUS

## 2018-11-10 MED ORDER — DEXAMETHASONE SODIUM PHOSPHATE 10 MG/ML IJ SOLN
INTRAMUSCULAR | Status: AC
  Filled 2018-11-10: qty 1

## 2018-11-10 MED ORDER — CEFAZOLIN SODIUM-DEXTROSE 2-4 GM/100ML-% IV SOLN
2.0000 g | Freq: Four times a day (QID) | INTRAVENOUS | Status: AC
  Administered 2018-11-10 (×2): 2 g via INTRAVENOUS
  Filled 2018-11-10 (×2): qty 100

## 2018-11-10 MED ORDER — PROPOFOL 500 MG/50ML IV EMUL
INTRAVENOUS | Status: DC | PRN
  Administered 2018-11-10: 50 ug/kg/min via INTRAVENOUS

## 2018-11-10 MED ORDER — LIDOCAINE HCL (CARDIAC) PF 100 MG/5ML IV SOSY
PREFILLED_SYRINGE | INTRAVENOUS | Status: DC | PRN
  Administered 2018-11-10: 50 mg via INTRAVENOUS

## 2018-11-10 MED ORDER — ALUM & MAG HYDROXIDE-SIMETH 200-200-20 MG/5ML PO SUSP: 15.0000 mL | ORAL | Status: DC | PRN

## 2018-11-10 MED ORDER — ASPIRIN 81 MG PO CHEW
81.0000 mg | CHEWABLE_TABLET | Freq: Two times a day (BID) | ORAL | Status: DC
  Administered 2018-11-10 – 2018-11-11 (×2): 81 mg via ORAL
  Filled 2018-11-10 (×2): qty 1

## 2018-11-10 MED ORDER — ROCURONIUM BROMIDE 10 MG/ML (PF) SYRINGE
PREFILLED_SYRINGE | INTRAVENOUS | Status: AC
  Filled 2018-11-10: qty 10

## 2018-11-10 MED ORDER — TRAZODONE HCL 100 MG PO TABS
100.0000 mg | ORAL_TABLET | Freq: Every day | ORAL | Status: DC
  Administered 2018-11-10: 100 mg via ORAL
  Filled 2018-11-10: qty 1

## 2018-11-10 MED ORDER — FLUTICASONE PROPIONATE 50 MCG/ACT NA SUSP
1.0000 | Freq: Every day | NASAL | Status: DC
  Administered 2018-11-11: 1 via NASAL
  Filled 2018-11-10: qty 16

## 2018-11-10 MED ORDER — CELECOXIB 200 MG PO CAPS
200.0000 mg | ORAL_CAPSULE | Freq: Two times a day (BID) | ORAL | Status: DC
  Administered 2018-11-10 – 2018-11-11 (×2): 200 mg via ORAL
  Filled 2018-11-10 (×2): qty 1

## 2018-11-10 MED ORDER — DULOXETINE HCL 60 MG PO CPEP
90.0000 mg | ORAL_CAPSULE | Freq: Every day | ORAL | Status: DC
  Administered 2018-11-11: 90 mg via ORAL
  Filled 2018-11-10: qty 1

## 2018-11-10 MED ORDER — FENTANYL CITRATE (PF) 100 MCG/2ML IJ SOLN
INTRAMUSCULAR | Status: DC | PRN
  Administered 2018-11-10 (×2): 50 ug via INTRAVENOUS

## 2018-11-10 MED ORDER — MIDAZOLAM HCL 5 MG/5ML IJ SOLN
INTRAMUSCULAR | Status: DC | PRN
  Administered 2018-11-10 (×2): 1 mg via INTRAVENOUS

## 2018-11-10 MED ORDER — GABAPENTIN 600 MG PO TABS
600.0000 mg | ORAL_TABLET | Freq: Three times a day (TID) | ORAL | Status: DC
Start: 2018-11-10 — End: 2018-11-10
  Filled 2018-11-10: qty 1

## 2018-11-10 MED ORDER — NON FORMULARY: 20.0000 mg | Freq: Every day | Status: DC

## 2018-11-10 MED ORDER — KETAMINE HCL 10 MG/ML IJ SOLN
INTRAMUSCULAR | Status: AC
  Filled 2018-11-10: qty 1

## 2018-11-10 MED ORDER — BUPIVACAINE-EPINEPHRINE (PF) 0.25% -1:200000 IJ SOLN
INTRAMUSCULAR | Status: AC
  Filled 2018-11-10: qty 30

## 2018-11-10 MED ORDER — OXYCODONE HCL 5 MG/5ML PO SOLN: 5.0000 mg | Freq: Once | ORAL | Status: AC | PRN

## 2018-11-10 MED ORDER — PHENYLEPHRINE 40 MCG/ML (10ML) SYRINGE FOR IV PUSH (FOR BLOOD PRESSURE SUPPORT)
PREFILLED_SYRINGE | INTRAVENOUS | Status: DC | PRN
  Administered 2018-11-10: 80 ug via INTRAVENOUS

## 2018-11-10 MED ORDER — ACETAMINOPHEN 500 MG PO TABS
1000.0000 mg | ORAL_TABLET | Freq: Four times a day (QID) | ORAL | Status: AC
  Administered 2018-11-10 – 2018-11-11 (×4): 1000 mg via ORAL
  Filled 2018-11-10 (×4): qty 2

## 2018-11-10 MED ORDER — FENTANYL CITRATE (PF) 100 MCG/2ML IJ SOLN
INTRAMUSCULAR | Status: AC
  Filled 2018-11-10: qty 2

## 2018-11-10 MED ORDER — PROMETHAZINE HCL 25 MG/ML IJ SOLN: 6.2500 mg | INTRAMUSCULAR | Status: DC | PRN

## 2018-11-10 MED ORDER — MENTHOL 3 MG MT LOZG: 1.0000 | LOZENGE | OROMUCOSAL | Status: DC | PRN

## 2018-11-10 MED ORDER — SODIUM CHLORIDE (PF) 0.9 % IJ SOLN
INTRAMUSCULAR | Status: DC | PRN
  Administered 2018-11-10: 30 mL

## 2018-11-10 MED ORDER — EPHEDRINE SULFATE-NACL 50-0.9 MG/10ML-% IV SOSY
PREFILLED_SYRINGE | INTRAVENOUS | Status: DC | PRN
  Administered 2018-11-10 (×3): 10 mg via INTRAVENOUS
  Administered 2018-11-10: 15 mg via INTRAVENOUS

## 2018-11-10 MED ORDER — GABAPENTIN 300 MG PO CAPS
600.0000 mg | ORAL_CAPSULE | Freq: Three times a day (TID) | ORAL | Status: DC
  Administered 2018-11-10 – 2018-11-11 (×3): 600 mg via ORAL
  Filled 2018-11-10 (×3): qty 2

## 2018-11-10 MED ORDER — ONDANSETRON HCL 4 MG PO TABS: 4.0000 mg | ORAL_TABLET | Freq: Four times a day (QID) | ORAL | Status: DC | PRN

## 2018-11-10 MED ORDER — PROPOFOL 10 MG/ML IV BOLUS
INTRAVENOUS | Status: DC | PRN
  Administered 2018-11-10: 180 mg via INTRAVENOUS

## 2018-11-10 MED ORDER — TIZANIDINE HCL 4 MG PO TABS
2.0000 mg | ORAL_TABLET | Freq: Three times a day (TID) | ORAL | Status: DC | PRN
  Administered 2018-11-10 – 2018-11-11 (×2): 2 mg via ORAL
  Filled 2018-11-10 (×2): qty 1

## 2018-11-10 MED ORDER — PHENYLEPHRINE HCL (PRESSORS) 10 MG/ML IV SOLN
INTRAVENOUS | Status: AC
  Filled 2018-11-10: qty 1

## 2018-11-10 MED ORDER — POVIDONE-IODINE 10 % EX SWAB: 2.0000 "application " | Freq: Once | CUTANEOUS | Status: DC

## 2018-11-10 MED ORDER — METOCLOPRAMIDE HCL 5 MG PO TABS: 5.0000 mg | ORAL_TABLET | Freq: Three times a day (TID) | ORAL | Status: DC | PRN

## 2018-11-10 MED ORDER — KETOROLAC TROMETHAMINE 30 MG/ML IJ SOLN
INTRAMUSCULAR | Status: AC
  Filled 2018-11-10: qty 1

## 2018-11-10 MED ORDER — TRANEXAMIC ACID-NACL 1000-0.7 MG/100ML-% IV SOLN
1000.0000 mg | INTRAVENOUS | Status: AC
  Administered 2018-11-10: 08:00:00 1000 mg via INTRAVENOUS
  Filled 2018-11-10: qty 100

## 2018-11-10 MED ORDER — OXYCODONE HCL 5 MG PO TABS
10.0000 mg | ORAL_TABLET | ORAL | Status: DC | PRN
  Administered 2018-11-10 – 2018-11-11 (×4): 10 mg via ORAL
  Filled 2018-11-10 (×4): qty 2

## 2018-11-10 MED ORDER — HYDROMORPHONE HCL 1 MG/ML IJ SOLN
0.5000 mg | INTRAMUSCULAR | Status: DC | PRN
  Administered 2018-11-10 – 2018-11-11 (×2): 0.5 mg via INTRAVENOUS
  Filled 2018-11-10 (×3): qty 1

## 2018-11-10 MED ORDER — FERROUS SULFATE 325 (65 FE) MG PO TABS
325.0000 mg | ORAL_TABLET | Freq: Two times a day (BID) | ORAL | Status: DC
  Administered 2018-11-10 – 2018-11-11 (×2): 325 mg via ORAL
  Filled 2018-11-10 (×2): qty 1

## 2018-11-10 MED ORDER — SODIUM CHLORIDE (PF) 0.9 % IJ SOLN
INTRAMUSCULAR | Status: AC
  Filled 2018-11-10: qty 50

## 2018-11-10 SURGICAL SUPPLY — 61 items
ADH SKN CLS APL DERMABOND .7 (GAUZE/BANDAGES/DRESSINGS) ×1
ATTUNE MED ANAT PAT 35 KNEE (Knees) ×1 IMPLANT
ATTUNE PSFEM LTSZ5 NARCEM KNEE (Femur) ×1 IMPLANT
ATTUNE PSRP INSR SZ5 6 KNEE (Insert) ×1 IMPLANT
BAG SPEC THK2 15X12 ZIP CLS (MISCELLANEOUS) ×1
BAG ZIPLOCK 12X15 (MISCELLANEOUS) ×1 IMPLANT
BANDAGE ACE 6X5 VEL STRL LF (GAUZE/BANDAGES/DRESSINGS) ×2 IMPLANT
BASE TIBIAL ROT PLAT SZ 5 KNEE (Knees) IMPLANT
BLADE SAW SGTL 11.0X1.19X90.0M (BLADE) IMPLANT
BLADE SAW SGTL 13.0X1.19X90.0M (BLADE) ×2 IMPLANT
BLADE SURG SZ10 CARB STEEL (BLADE) ×4 IMPLANT
BOWL SMART MIX CTS (DISPOSABLE) ×2 IMPLANT
BSPLAT TIB 5 CMNT ROT PLAT STR (Knees) ×1 IMPLANT
CEMENT HV SMART SET (Cement) ×2 IMPLANT
COVER SURGICAL LIGHT HANDLE (MISCELLANEOUS) ×2 IMPLANT
COVER WAND RF STERILE (DRAPES) IMPLANT
CUFF TOURN SGL QUICK 34 (TOURNIQUET CUFF) ×2
CUFF TRNQT CYL 34X4.125X (TOURNIQUET CUFF) ×1 IMPLANT
DECANTER SPIKE VIAL GLASS SM (MISCELLANEOUS) ×4 IMPLANT
DERMABOND ADVANCED (GAUZE/BANDAGES/DRESSINGS) ×1
DERMABOND ADVANCED .7 DNX12 (GAUZE/BANDAGES/DRESSINGS) ×1 IMPLANT
DRAPE U-SHAPE 47X51 STRL (DRAPES) ×2 IMPLANT
DRESSING AQUACEL AG SP 3.5X10 (GAUZE/BANDAGES/DRESSINGS) ×1 IMPLANT
DRSG AQUACEL AG SP 3.5X10 (GAUZE/BANDAGES/DRESSINGS) ×2
DURAPREP 26ML APPLICATOR (WOUND CARE) ×4 IMPLANT
ELECT REM PT RETURN 15FT ADLT (MISCELLANEOUS) ×2 IMPLANT
GLOVE BIO SURGEON STRL SZ 6 (GLOVE) ×2 IMPLANT
GLOVE BIOGEL PI IND STRL 6.5 (GLOVE) ×1 IMPLANT
GLOVE BIOGEL PI IND STRL 7.5 (GLOVE) ×1 IMPLANT
GLOVE BIOGEL PI IND STRL 8.5 (GLOVE) ×1 IMPLANT
GLOVE BIOGEL PI INDICATOR 6.5 (GLOVE) ×1
GLOVE BIOGEL PI INDICATOR 7.5 (GLOVE) ×1
GLOVE BIOGEL PI INDICATOR 8.5 (GLOVE) ×1
GLOVE ECLIPSE 8.0 STRL XLNG CF (GLOVE) ×1 IMPLANT
GLOVE ORTHO TXT STRL SZ7.5 (GLOVE) ×1 IMPLANT
GOWN STRL REUS W/ TWL LRG LVL3 (GOWN DISPOSABLE) ×1 IMPLANT
GOWN STRL REUS W/TWL 2XL LVL3 (GOWN DISPOSABLE) ×1 IMPLANT
GOWN STRL REUS W/TWL LRG LVL3 (GOWN DISPOSABLE) ×5 IMPLANT
HANDPIECE INTERPULSE COAX TIP (DISPOSABLE) ×2
HOLDER FOLEY CATH W/STRAP (MISCELLANEOUS) ×1 IMPLANT
KIT TURNOVER KIT A (KITS) ×1 IMPLANT
MANIFOLD NEPTUNE II (INSTRUMENTS) ×2 IMPLANT
NDL SAFETY ECLIPSE 18X1.5 (NEEDLE) IMPLANT
NEEDLE HYPO 18GX1.5 SHARP (NEEDLE)
NS IRRIG 1000ML POUR BTL (IV SOLUTION) ×2 IMPLANT
PACK TOTAL KNEE CUSTOM (KITS) ×2 IMPLANT
PROTECTOR NERVE ULNAR (MISCELLANEOUS) ×2 IMPLANT
SET HNDPC FAN SPRY TIP SCT (DISPOSABLE) ×1 IMPLANT
SET PAD KNEE POSITIONER (MISCELLANEOUS) ×2 IMPLANT
SUT MNCRL AB 4-0 PS2 18 (SUTURE) ×2 IMPLANT
SUT STRATAFIX PDS+ 0 24IN (SUTURE) ×2 IMPLANT
SUT VIC AB 1 CT1 36 (SUTURE) ×2 IMPLANT
SUT VIC AB 2-0 CT1 27 (SUTURE) ×6
SUT VIC AB 2-0 CT1 TAPERPNT 27 (SUTURE) ×3 IMPLANT
SYR 3ML LL SCALE MARK (SYRINGE) ×2 IMPLANT
TIBIAL BASE ROT PLAT SZ 5 KNEE (Knees) ×2 IMPLANT
TRAY FOLEY MTR SLVR 14FR STAT (SET/KITS/TRAYS/PACK) ×1 IMPLANT
TRAY FOLEY MTR SLVR 16FR STAT (SET/KITS/TRAYS/PACK) ×1 IMPLANT
WATER STERILE IRR 1000ML POUR (IV SOLUTION) ×4 IMPLANT
WRAP KNEE MAXI GEL POST OP (GAUZE/BANDAGES/DRESSINGS) ×2 IMPLANT
YANKAUER SUCT BULB TIP 10FT TU (MISCELLANEOUS) ×2 IMPLANT

## 2018-11-10 NOTE — Evaluation (Signed)
Physical Therapy Evaluation Patient Details Name: Kimberly Larsen MRN: [id number] DOB: 1945-07-26 Today's Date: 11/10/2018   History of Present Illness  L TKA  Clinical Impression  Pt is s/p TKA resulting in the deficits listed below (see PT Problem List). Pt ambulated 72' with RW, no loss of balance. Initiated TKA HEP, good progress expected.  Pt will benefit from skilled PT to increase their independence and safety with mobility to allow discharge to the venue listed below.      Follow Up Recommendations Follow surgeon's recommendation for DC plan and follow-up therapies    Equipment Recommendations  Rolling walker with 5" wheels    Recommendations for Other Services       Precautions / Restrictions Precautions Precautions: Knee Precaution Comments: reviewed no pillow under knee Restrictions Weight Bearing Restrictions: No Other Position/Activity Restrictions: WBAT      Mobility  Bed Mobility Overal bed mobility: Needs Assistance Bed Mobility: Supine to Sit     Supine to sit: HOB elevated;Min assist     General bed mobility comments: min A LLE  Transfers Overall transfer level: Needs assistance Equipment used: Rolling walker (2 wheeled) Transfers: Sit to/from Stand Sit to Stand: Min assist         General transfer comment: min A to rise, VCs hand placement  Ambulation/Gait Ambulation/Gait assistance: Min guard Gait Distance (Feet): 70 Feet Assistive device: Rolling walker (2 wheeled) Gait Pattern/deviations: Step-to pattern Gait velocity: decr   General Gait Details: VCs sequencing, no loss of balance  Stairs            Wheelchair Mobility    Modified Rankin (Stroke Patients Only)       Balance Overall balance assessment: Modified Independent                                           Pertinent Vitals/Pain Pain Assessment: No/denies pain Pain Intervention(s): Limited activity within patient's tolerance;Monitored during  session;Premedicated before session;Ice applied    Home Living Family/patient expects to be discharged to:: Private residence Living Arrangements: Alone Available Help at Discharge: Neighbor;Available 24 hours/day   Home Access: Stairs to enter   Entrance Stairs-Number of Steps: 1   Home Equipment: Toilet riser;Cane - single point;Walker - standard      Prior Function Level of Independence: Independent               Hand Dominance        Extremity/Trunk Assessment   Upper Extremity Assessment Upper Extremity Assessment: Overall WFL for tasks assessed    Lower Extremity Assessment Lower Extremity Assessment: LLE deficits/detail LLE Deficits / Details: SLR 3/5, 0-45* AAROM knee LLE Sensation: WNL LLE Coordination: WNL    Cervical / Trunk Assessment Cervical / Trunk Assessment: Normal  Communication   Communication: No difficulties  Cognition Arousal/Alertness: Awake/alert Behavior During Therapy: WFL for tasks assessed/performed Overall Cognitive Status: Within Functional Limits for tasks assessed                                        General Comments      Exercises Total Joint Exercises Ankle Circles/Pumps: Both;10 reps;Supine Quad Sets: AROM;Left;5 reps;Supine Heel Slides: AAROM;Left;5 reps;Supine Goniometric ROM: 0-45* AAROM L knee   Assessment/Plan    PT Assessment Patient needs continued PT services  PT  Problem List Decreased strength;Decreased range of motion;Decreased activity tolerance;Decreased balance;Pain;Decreased knowledge of use of DME;Decreased mobility       PT Treatment Interventions DME instruction;Gait training;Stair training;Functional mobility training;Therapeutic exercise;Therapeutic activities;Balance training;Patient/family education    PT Goals (Current goals can be found in the Care Plan section)  Acute Rehab PT Goals Patient Stated Goal: walk independently PT Goal Formulation: With patient Time For Goal  Achievement: 11/17/18 Potential to Achieve Goals: Good    Frequency 7X/week   Barriers to discharge        Co-evaluation               AM-PAC PT "6 Clicks" Mobility  Outcome Measure Help needed turning from your back to your side while in a flat bed without using bedrails?: A Little Help needed moving from lying on your back to sitting on the side of a flat bed without using bedrails?: A Little Help needed moving to and from a bed to a chair (including a wheelchair)?: A Little Help needed standing up from a chair using your arms (e.g., wheelchair or bedside chair)?: A Little   Help needed climbing 3-5 steps with a railing? : A Lot 6 Click Score: 14    End of Session Equipment Utilized During Treatment: Gait belt Activity Tolerance: Patient tolerated treatment well Patient left: in chair;with call bell/phone within reach;with chair alarm set Nurse Communication: Mobility status PT Visit Diagnosis: Muscle weakness (generalized) (M62.81);Difficulty in walking, not elsewhere classified (R26.2);Pain Pain - Right/Left: Left Pain - part of body: Knee    Time: 7628-3151 PT Time Calculation (min) (ACUTE ONLY): 20 min   Charges:   PT Evaluation $PT Eval Low Complexity: 1 Low        Blondell Reveal Kistler PT 11/10/2018  Acute Rehabilitation Services Pager [phone] Office [phone]

## 2018-11-10 NOTE — Interval H&P Note (Signed)
History and Physical Interval Note:  11/10/2018 7:00 AM  Kimberly Larsen  has presented today for surgery, with the diagnosis of Left knee osteoarthritis.  The various methods of treatment have been discussed with the patient and family. After consideration of risks, benefits and other options for treatment, the patient has consented to  Procedure(s) with comments: TOTAL KNEE ARTHROPLASTY (Left) - 70 mins as a surgical intervention.  The patient's history has been reviewed, patient examined, no change in status, stable for surgery.  I have reviewed the patient's chart and labs.  Questions were answered to the patient's satisfaction.     Mauri Pole

## 2018-11-10 NOTE — Anesthesia Postprocedure Evaluation (Addendum)
Anesthesia Post Note  Patient: Kimberly Larsen  Procedure(s) Performed: TOTAL KNEE ARTHROPLASTY (Left Knee)     Patient location during evaluation: PACU Anesthesia Type: General Level of consciousness: awake and alert Pain management: pain level controlled Vital Signs Assessment: post-procedure vital signs reviewed and stable Respiratory status: spontaneous breathing, nonlabored ventilation and respiratory function stable Cardiovascular status: blood pressure returned to baseline and stable Postop Assessment: no apparent nausea or vomiting Anesthetic complications: no    Last Vitals:  Vitals:   11/10/18 1030 11/10/18 1045  BP: 140/75 104/87  Pulse: 81 78  Resp: 17 14  Temp:  37.2 C  SpO2: 100% 97%    Last Pain:  Vitals:   11/10/18 1045  TempSrc:   PainSc: [place]

## 2018-11-10 NOTE — Anesthesia Procedure Notes (Addendum)
Anesthesia Regional Block: Adductor canal block   Pre-Anesthetic Checklist: ,, timeout performed, Correct Patient, Correct Site, Correct Laterality, Correct Procedure, Correct Position, site marked, Risks and benefits discussed, pre-op evaluation,  At surgeon's request and post-op pain management  Laterality: Left  Prep: Maximum Sterile Barrier Precautions used, chloraprep       Needles:  Injection technique: Single-shot  Needle Type: Echogenic Stimulator Needle     Needle Length: 9cm  Needle Gauge: 22     Additional Needles:   Procedures:,,,, ultrasound used (permanent image in chart),,,,  Narrative:  Start time: 11/10/2018 7:07 AM End time: 11/10/2018 7:09 AM Injection made incrementally with aspirations every 5 mL.  Performed by: Personally  Anesthesiologist: Brennan Bailey, MD  Additional Notes: Risks, benefits, and alternative discussed. Patient gave consent for procedure. Patient prepped and draped in sterile fashion. Sedation administered, patient remains easily responsive to voice. Relevant anatomy identified with ultrasound guidance. Local anesthetic given in 5cc increments with no signs or symptoms of intravascular injection. No pain or paraesthesias with injection. Patient monitored throughout procedure with signs of LAST or immediate complications. Tolerated well. Ultrasound image placed in chart.  Tawny Asal, MD

## 2018-11-10 NOTE — Transfer of Care (Signed)
Immediate Anesthesia Transfer of Care Note  Patient: Kimberly Larsen  Procedure(s) Performed: Procedure(s) with comments: TOTAL KNEE ARTHROPLASTY (Left) - 70 mins  Patient Location: PACU  Anesthesia Type:General and Regional  Level of Consciousness: Patient easily awoken, sedated, comfortable, cooperative, following commands, responds to stimulation.   Airway & Oxygen Therapy: Patient spontaneously breathing, ventilating well, oxygen via simple oxygen mask.  Post-op Assessment: Report given to PACU RN, vital signs reviewed and stable, moving all extremities.   Post vital signs: Reviewed and stable.  Complications: No apparent anesthesia complications  Last Vitals:  Vitals Value Taken Time  BP 143/54 11/10/18 0930  Temp 36.4 C 11/10/18 0930  Pulse 86 11/10/18 0935  Resp 16 11/10/18 0935  SpO2 95 % 11/10/18 0935  Vitals shown include unvalidated device data.  Last Pain:  Vitals:   11/10/18 0546  TempSrc: Oral         Complications: No apparent anesthesia complications

## 2018-11-10 NOTE — Anesthesia Procedure Notes (Signed)
Spinal  Patient location during procedure: OR Start time: 11/10/2018 7:22 AM End time: 11/10/2018 7:27 AM Staffing Anesthesiologist: Brennan Bailey, MD Performed: anesthesiologist  Preanesthetic Checklist Completed: patient identified, surgical consent, pre-op evaluation, timeout performed, IV checked, risks and benefits discussed and monitors and equipment checked Spinal Block Patient position: sitting Prep: site prepped and draped and DuraPrep Patient monitoring: cardiac monitor, continuous pulse ox and blood pressure Approach: midline Location: L3-4 Injection technique: single-shot Needle Needle type: Whitacre  Needle gauge: 22 G Needle length: 9 cm Additional Notes Risks, benefits, and alternative discussed. Patient gave consent to procedure. Prepped and draped in sitting position. First attempt at L3-4 midline, scar tissue noted with poor tactile feedback, unable to access intrathecal space. Second attempt right paramedian unsuccessful. Converted plan to GETA. Tawny Asal, MD

## 2018-11-10 NOTE — Op Note (Signed)
NAME:  Kimberly Larsen                      MEDICAL RECORD NO.:  [id number]                             FACILITY:  [facility]      PHYSICIAN:  Pietro Cassis. Alvan Dame, M.D.  DATE OF BIRTH:  23-Jun-1945      DATE OF PROCEDURE:  11/10/2018                                     OPERATIVE REPORT         PREOPERATIVE DIAGNOSIS:  Left knee osteoarthritis.      POSTOPERATIVE DIAGNOSIS:  Left knee osteoarthritis.      FINDINGS:  The patient was noted to have complete loss of cartilage and   bone-on-bone arthritis with associated osteophytes in the medial and pateloofemoral compartments of   the knee with a significant synovitis and associated effusion.  The patient had failed months of conservative treatment including medications, injection therapy, activity modification.     PROCEDURE:  Left total knee replacement.      COMPONENTS USED:  DePuy Attune rotating platform posterior stabilized knee   system, a size 5N femur, 5 tibia, size 6 mm PS AOX insert, and 35 anatomic patellar   button.      SURGEON:  Pietro Cassis. Alvan Dame, M.D.      ASSISTANT:  Griffith Citron, PA-C.      ANESTHESIA:  General and Regional.      SPECIMENS:  None.      COMPLICATION:  None.      DRAINS:  None.  EBL: <100cc      TOURNIQUET TIME:   Total Tourniquet Time Documented: Thigh (Left) - 29 minutes Total: Thigh (Left) - 29 minutes  .      The patient was stable to the recovery room.      INDICATION FOR PROCEDURE:  Kimberly Larsen is a 73 y.o. female patient of   mine.  The patient had been seen, evaluated, and treated for months conservatively in the   office with medication, activity modification, and injections.  The patient had   radiographic changes of bone-on-bone arthritis with endplate sclerosis and osteophytes noted.  Based on the radiographic changes and failed conservative measures, the patient   decided to proceed with definitive treatment, total knee replacement.  Risks of infection, DVT, component failure, need for  revision surgery, neurovascular injury were reviewed in the office setting.  The postop course was reviewed stressing the efforts to maximize post-operative satisfaction and function.  Consent was obtained for benefit of pain   relief.      PROCEDURE IN DETAIL:  The patient was brought to the operative theater.   Once adequate anesthesia, preoperative antibiotics, 2 gm of Ancef,1 gm of Tranexamic Acid, and 10 mg of Decadron administered, the patient was positioned supine with a left thigh tourniquet placed.  The  left lower extremity was prepped and draped in sterile fashion.  A time-   out was performed identifying the patient, planned procedure, and the appropriate extremity.      The left lower extremity was placed in the [facility] leg holder.  The leg was   exsanguinated, tourniquet elevated to 250 mmHg.  A midline incision was   made  followed by median parapatellar arthrotomy.  Following initial   exposure, attention was first directed to the patella.  Precut   measurement was noted to be 23 mm.  I resected down to 13 mm and used a   35 anatomic patellar button to restore patellar height as well as cover the cut surface.      The lug holes were drilled and a metal shim was placed to protect the   patella from retractors and saw blade during the procedure.      At this point, attention was now directed to the femur.  The femoral   canal was opened with a drill, irrigated to try to prevent fat emboli.  An   intramedullary rod was passed at 3 degrees valgus, 9 mm of bone was   resected off the distal femur.  Following this resection, the tibia was   subluxated anteriorly.  Using the extramedullary guide, 2 mm of bone was resected off   the proximal medial tibia.  We confirmed the gap would be   stable medially and laterally with a size 5 spacer block as well as confirmed that the tibial cut was perpendicular in the coronal plane, checking with an alignment rod.      Once this was done, I  sized the femur to be a size 5 in the anterior-   posterior dimension, chose a narrow component based on medial and   lateral dimension.  The size 5 rotation block was then pinned in   position anterior referenced using the C-clamp to set rotation.  The   anterior, posterior, and  chamfer cuts were made without difficulty nor   notching making certain that I was along the anterior cortex to help   with flexion gap stability.      The final box cut was made off the lateral aspect of distal femur.      At this point, the tibia was sized to be a size 5.  The size 5 tray was   then pinned in position through the medial third of the tubercle,   drilled, and keel punched.  Trial reduction was now carried with a 5 femur,  5 tibia, a size 5 then 6 mm PS insert, and the 35 anatomic patella botton.  The knee was brought to full extension with good flexion stability with the patella   tracking through the trochlea without application of pressure.  Given   all these findings the trial components removed.  Final components were   opened and cement was mixed.  The knee was irrigated with normal saline solution and pulse lavage.  The synovial lining was   then injected with 30 cc of 0.25% Marcaine with epinephrine, 1 cc of Toradol and 30 cc of NS for a total of 61 cc.     Final implants were then cemented onto cleaned and dried cut surfaces of bone with the knee brought to extension with a size 6 mm PS trial insert.      Once the cement had fully cured, excess cement was removed   throughout the knee.  I confirmed that I was satisfied with the range of   motion and stability, and the final size 6 mm PS AOX insert was chosen.  It was   placed into the knee.      The tourniquet had been let down at 29 minutes.  No significant   hemostasis was required.  The extensor mechanism was then reapproximated using #1  Vicryl and #1 Stratafix sutures with the knee   in flexion.  The   remaining wound was closed  with 2-0 Vicryl and running 4-0 Monocryl.   The knee was cleaned, dried, dressed sterilely using Dermabond and   Aquacel dressing.  The patient was then   brought to recovery room in stable condition, tolerating the procedure   well.   Please note that Physician Assistant, Griffith Citron, PA-C was present for the entirety of the case, and was utilized for pre-operative positioning, peri-operative retractor management, general facilitation of the procedure and for primary wound closure at the end of the case.              Pietro Cassis Alvan Dame, M.D.    11/10/2018 8:50 AM

## 2018-11-10 NOTE — Anesthesia Procedure Notes (Signed)
Procedure Name: Intubation Date/Time: 11/10/2018 7:41 AM Performed by: Deliah Boston, CRNA Pre-anesthesia Checklist: Patient identified, Emergency Drugs available, Suction available and Patient being monitored Patient Re-evaluated:Patient Re-evaluated prior to induction Oxygen Delivery Method: Circle system utilized Preoxygenation: Pre-oxygenation with 100% oxygen Induction Type: IV induction Ventilation: Mask ventilation without difficulty Laryngoscope Size: Mac and 3 Grade View: Grade I Tube type: Oral Tube size: 7.0 mm Number of attempts: 1 Airway Equipment and Method: Stylet and Oral airway Placement Confirmation: ETT inserted through vocal cords under direct vision,  positive ETCO2 and breath sounds checked- equal and bilateral Secured at: 21 cm Tube secured with: Tape Dental Injury: Teeth and Oropharynx as per pre-operative assessment

## 2018-11-10 NOTE — Discharge Instructions (Signed)

## 2018-11-10 NOTE — Care Plan (Signed)
Ortho Bundle Case Management Note  Patient Details  Name: Kimberly Larsen MRN: [id number] Date of Birth: [birth year]  L TKA scheduled on 11-10-18 DCP:  Home with dtr and neighbor.  1 story apt with 1 ste. DME:  RW ordered through [facility].  Has handicap accessible toilet. PT:  [facility].  PT eval scheduled on 11-13-18.                   DME Arranged:  Gilford Rile rolling DME Agency:  [employer]  HH Arranged:  NA [place] Agency:  NA  Additional Comments: Please contact me with any questions of if this plan should need to change.  Marianne Sofia, RN,CCM [facility]  [phone] 11/10/2018, 9:19 AM

## 2018-11-11 ENCOUNTER — Encounter (HOSPITAL_COMMUNITY): Payer: Self-pay | Admitting: Orthopedic Surgery

## 2018-11-11 DIAGNOSIS — F419 Anxiety disorder, unspecified: Secondary | ICD-10-CM | POA: Diagnosis not present

## 2018-11-11 DIAGNOSIS — E785 Hyperlipidemia, unspecified: Secondary | ICD-10-CM | POA: Diagnosis not present

## 2018-11-11 DIAGNOSIS — E669 Obesity, unspecified: Secondary | ICD-10-CM

## 2018-11-11 DIAGNOSIS — K219 Gastro-esophageal reflux disease without esophagitis: Secondary | ICD-10-CM | POA: Diagnosis not present

## 2018-11-11 DIAGNOSIS — M1712 Unilateral primary osteoarthritis, left knee: Secondary | ICD-10-CM | POA: Diagnosis not present

## 2018-11-11 DIAGNOSIS — I10 Essential (primary) hypertension: Secondary | ICD-10-CM | POA: Diagnosis not present

## 2018-11-11 DIAGNOSIS — F329 Major depressive disorder, single episode, unspecified: Secondary | ICD-10-CM | POA: Diagnosis not present

## 2018-11-11 HISTORY — DX: Obesity, unspecified: E66.9

## 2018-11-11 LAB — BASIC METABOLIC PANEL
Anion gap: 8 (ref 5–15)
BUN: 23 mg/dL (ref 8–23)
CO2: 28 mmol/L (ref 22–32)
Calcium: 8.8 mg/dL — ABNORMAL LOW (ref 8.9–10.3)
Chloride: 105 mmol/L (ref 98–111)
Creatinine, Ser: 0.9 mg/dL (ref 0.44–1.00)
GFR calc Af Amer: >60 mL/min (ref >60)
GFR calc non Af Amer: >60 mL/min (ref >60)
Glucose, Bld: 122 mg/dL — ABNORMAL HIGH (ref 70–99)
Potassium: 4.7 mmol/L (ref 3.5–5.1)
Sodium: 141 mmol/L (ref 135–145)

## 2018-11-11 LAB — CBC
HCT: 36.4 % (ref 36.0–46.0)
Hemoglobin: 10.9 g/dL — ABNORMAL LOW (ref 12.0–15.0)
MCH: 29.9 pg (ref 26.0–34.0)
MCHC: 29.9 g/dL — ABNORMAL LOW (ref 30.0–36.0)
MCV: 99.7 fL (ref 80.0–100.0)
Platelets: 205 10*3/uL (ref 150–400)
RBC: 3.65 MIL/uL — ABNORMAL LOW (ref 3.87–5.11)
RDW: 13.3 % (ref 11.5–15.5)
WBC: 13.6 10*3/uL — ABNORMAL HIGH (ref 4.0–10.5)
nRBC: 0 % (ref 0.0–0.2)

## 2018-11-11 MED ORDER — HYDROMORPHONE HCL 2 MG PO TABS
2.0000 mg | ORAL_TABLET | ORAL | Status: DC
  Administered 2018-11-11: 2 mg via ORAL
  Filled 2018-11-11: qty 1

## 2018-11-11 MED ORDER — POLYETHYLENE GLYCOL 3350 17 G PO PACK: 17.0000 g | PACK | Freq: Two times a day (BID) | ORAL | 0 refills | Status: AC

## 2018-11-11 MED ORDER — FUROSEMIDE 20 MG PO TABS
10.0000 mg | ORAL_TABLET | Freq: Once | ORAL | Status: AC
  Administered 2018-11-11: 10 mg via ORAL
  Filled 2018-11-11: qty 1

## 2018-11-11 MED ORDER — ACETAMINOPHEN 500 MG PO TABS: 1000.0000 mg | ORAL_TABLET | Freq: Three times a day (TID) | ORAL | 0 refills | Status: DC

## 2018-11-11 MED ORDER — HYDROMORPHONE HCL 2 MG PO TABS: 2.0000 mg | ORAL_TABLET | ORAL | 0 refills | Status: DC | PRN

## 2018-11-11 MED ORDER — ASPIRIN 81 MG PO CHEW: 81.0000 mg | CHEWABLE_TABLET | Freq: Two times a day (BID) | ORAL | 0 refills | Status: AC

## 2018-11-11 MED ORDER — DOCUSATE SODIUM 100 MG PO CAPS: 100.0000 mg | ORAL_CAPSULE | Freq: Two times a day (BID) | ORAL | 0 refills | Status: DC

## 2018-11-11 MED ORDER — FERROUS SULFATE 325 (65 FE) MG PO TABS: 325.0000 mg | ORAL_TABLET | Freq: Three times a day (TID) | ORAL | 0 refills | Status: DC

## 2018-11-11 MED ORDER — TIZANIDINE HCL 2 MG PO TABS: 2.0000 mg | ORAL_TABLET | Freq: Two times a day (BID) | ORAL | 0 refills | Status: DC

## 2018-11-11 NOTE — Progress Notes (Signed)
     Subjective: 1 Day Post-Op Procedure(s) (LRB): TOTAL KNEE ARTHROPLASTY (Left)   Patient reports pain as mild, pain controlled. No events throughout the night.  Pain is not fully controlled, but she is on regular Oxycodone for chronic back pain.Will adjust her meds to Dilaudid, but discussed getting her back on Oxycodone as soon as possible. Ready to be discharged home if pain is controlled and she does well with PT.    Patient's anticipated LOS is less than 2 midnights, meeting these requirements: - Lives within 1 hour of care - Has a competent adult at home to recover with post-op recover - NO history of  - Diabetes  - Coronary Artery Disease  - Heart failure  - Heart attack  - Stroke  - DVT/VTE  - Cardiac arrhythmia  - Respiratory Failure/COPD  - Renal failure  - Anemia  - Advanced Liver disease    Objective:   VITALS:   Vitals:   11/11/18 0110 11/11/18 0439  BP: (!) 116/53 (!) 116/47  Pulse: 70 80  Resp: 16 18  Temp: 98.5 F (36.9 C) 98.1 F (36.7 C)  SpO2: 98% 98%    Dorsiflexion/Plantar flexion intact Incision: dressing C/D/I No cellulitis present Compartment soft  LABS Recent Labs    11/11/18 0319  HGB 10.9*  HCT 36.4  WBC 13.6*  PLT 205    Recent Labs    11/11/18 0319  NA 141  K 4.7  BUN 23  CREATININE 0.90  GLUCOSE 122*     Assessment/Plan: 1 Day Post-Op Procedure(s) (LRB): TOTAL KNEE ARTHROPLASTY (Left) Foley cath Advance diet Up with therapy D/C IV fluids Discharge home Follow up in 2 weeks at [facility] Follow up with OLIN,Tamkia Temples D in 2 weeks.  Contact information:  [facility]) [address] [place] [phone]    Obese (BMI 30-39.9) Estimated body mass index is 37.3 kg/m as calculated from the following:   Height as of this encounter: 5\' 2"  (1.575 m).   Weight as of this encounter: 92.5 kg. Patient also counseled  that weight may inhibit the healing process Patient counseled that losing weight will help with future health issues     [place] Pugh. Brytni Dray   PAC  11/11/2018, 8:56 AM

## 2018-11-11 NOTE — Progress Notes (Signed)
Physical Therapy Treatment Patient Details Name: Kimberly Larsen MRN: [id number] DOB: February 13, 1946 Today's Date: 11/11/2018    History of Present Illness L TKA    PT Comments    Pt has met PT goals and is ready to DC home from PT standpoint. She ambulated 200' with RW, completed stair training, and demonstrates understanding of HEP.   Follow Up Recommendations  Follow surgeon's recommendation for DC plan and follow-up therapies     Equipment Recommendations  Rolling walker with 5" wheels    Recommendations for Other Services       Precautions / Restrictions Precautions Precautions: Knee Precaution Comments: reviewed no pillow under knee Restrictions Weight Bearing Restrictions: No LLE Weight Bearing: Weight bearing as tolerated Other Position/Activity Restrictions: WBAT    Mobility  Bed Mobility               General bed mobility comments: up in recliner  Transfers Overall transfer level: Needs assistance Equipment used: Rolling walker (2 wheeled) Transfers: Sit to/from Stand Sit to Stand: Supervision         General transfer comment: VCs hand placement  Ambulation/Gait Ambulation/Gait assistance: Supervision Gait Distance (Feet): 200 Feet Assistive device: Rolling walker (2 wheeled) Gait Pattern/deviations: Step-to pattern Gait velocity: decr   General Gait Details: good sequencing,  no loss of balance   Stairs Stairs: Yes Stairs assistance: Min guard Stair Management: No rails;Backwards;With walker Number of Stairs: 1 General stair comments: 1 STE x 2 trials, VCs sequencing   Wheelchair Mobility    Modified Rankin (Stroke Patients Only)       Balance Overall balance assessment: Modified Independent                                          Cognition Arousal/Alertness: Awake/alert Behavior During Therapy: WFL for tasks assessed/performed Overall Cognitive Status: Within Functional Limits for tasks assessed                                        Exercises Total Joint Exercises Ankle Circles/Pumps: Both;10 reps;Supine Quad Sets: AROM;Left;5 reps;Supine Short Arc Quad: AROM;Left;10 reps;Supine Heel Slides: AAROM;Left;Supine;10 reps Hip ABduction/ADduction: AROM;Left;10 reps;Supine Straight Leg Raises: AROM;Left;5 reps;Supine Long Arc Quad: AROM;Left;10 reps;Seated Knee Flexion: AAROM;AROM;Left;10 reps;Seated Goniometric ROM: 0-70* AAROM L knee    General Comments        Pertinent Vitals/Pain Pain Score: 4  Pain Location: L knee Pain Descriptors / Indicators: Sore Pain Intervention(s): Limited activity within patient's tolerance;Monitored during session;Premedicated before session;Ice applied    Home Living                      Prior Function            PT Goals (current goals can now be found in the care plan section) Acute Rehab PT Goals Patient Stated Goal: walk independently PT Goal Formulation: With patient Time For Goal Achievement: 11/17/18 Potential to Achieve Goals: Good Progress towards PT goals: Progressing toward goals    Frequency    7X/week      PT Plan Current plan remains appropriate    Co-evaluation              AM-PAC PT "6 Clicks" Mobility   Outcome Measure  Help needed turning from your back to your side  while in a flat bed without using bedrails?: None Help needed moving from lying on your back to sitting on the side of a flat bed without using bedrails?: None Help needed moving to and from a bed to a chair (including a wheelchair)?: None Help needed standing up from a chair using your arms (e.g., wheelchair or bedside chair)?: None Help needed to walk in hospital room?: None Help needed climbing 3-5 steps with a railing? : A Little 6 Click Score: 23    End of Session Equipment Utilized During Treatment: Gait belt Activity Tolerance: Patient tolerated treatment well Patient left: in chair;with call bell/phone within  reach;with chair alarm set Nurse Communication: Mobility status PT Visit Diagnosis: Muscle weakness (generalized) (M62.81);Difficulty in walking, not elsewhere classified (R26.2);Pain Pain - Right/Left: Left Pain - part of body: Knee     Time: 4707-6151 PT Time Calculation (min) (ACUTE ONLY): 24 min  Charges:  $Gait Training: 8-22 mins $Therapeutic Exercise: 8-22 mins                     Blondell Reveal Kistler PT 11/11/2018  Acute Rehabilitation Services Pager [phone] Office [phone]

## 2018-11-11 NOTE — Progress Notes (Signed)
Patient discharged to home w/ daughter. Given all belongings, instructions, equipment. Verbalized understanding of all instructions. Escorted to pov via w/c.

## 2018-11-13 DIAGNOSIS — I10 Essential (primary) hypertension: Secondary | ICD-10-CM | POA: Diagnosis not present

## 2018-11-13 DIAGNOSIS — M25562 Pain in left knee: Secondary | ICD-10-CM | POA: Diagnosis not present

## 2018-11-13 DIAGNOSIS — M25662 Stiffness of left knee, not elsewhere classified: Secondary | ICD-10-CM | POA: Diagnosis not present

## 2018-11-17 DIAGNOSIS — I10 Essential (primary) hypertension: Secondary | ICD-10-CM | POA: Diagnosis not present

## 2018-11-17 DIAGNOSIS — M25662 Stiffness of left knee, not elsewhere classified: Secondary | ICD-10-CM | POA: Diagnosis not present

## 2018-11-17 DIAGNOSIS — M25562 Pain in left knee: Secondary | ICD-10-CM | POA: Diagnosis not present

## 2018-11-17 NOTE — Discharge Summary (Signed)
Physician Discharge Summary  [name] MRN: [id number] DOB/AGE: May 25, 1945 73 y.o.  Admit date: 11/10/2018 Discharge date: 11/11/2018   Procedures:  Procedure(s) (LRB): TOTAL KNEE ARTHROPLASTY (Left)  Attending Physician:  Dr. Paralee Larsen   Admission Diagnoses:   Left knee primary OA / pain  Discharge Diagnoses:  Principal Problem:   S/P left TKA Active Problems:   Obese  Past Medical History:  Diagnosis Date   Anxiety    Arthritis    Back pain    Cancer ([place])    endometrial cancer   DDD (degenerative disc disease), lumbar    Depression    Gastroparesis    History of kidney stones    HLD (hyperlipidemia)    Hx of sepsis 02/2014   DUE TO MULTIPLE KIDNEY STONES   Hypertension    Osteoporosis    Pneumonia    2014   UTI (urinary tract infection)     HPI:    Kimberly Larsen, 73 y.o. female, has a history of pain and functional disability in the left knee due to arthritis and has failed non-surgical conservative treatments for greater than 12 weeks to include NSAID's and/or analgesics and activity modification.  Onset of symptoms was gradual, starting < 1 year ago with gradually worsening course since that time. The patient noted prior procedures on the knee to include  arthroplasty on the right knee 2014.  Patient currently rates pain in the left knee(s) at 6 out of 10 with activity. Patient has worsening of pain with activity and weight bearing, pain that interferes with activities of daily living, pain with passive range of motion, crepitus and joint swelling.  Patient has evidence of periarticular osteophytes and joint space narrowing by imaging studies. There is no active infection.  Risks, benefits and expectations were discussed with the patient.  Risks including but not limited to the risk of anesthesia, blood clots, nerve damage, blood vessel damage, failure of the prosthesis, infection and up to and including death.  Patient understand the  risks, benefits and expectations and wishes to proceed with surgery.    PCP: Kimberly Kiel, MD   Discharged Condition: good  Hospital Course:  Patient underwent the above stated procedure on 11/10/2018. Patient tolerated the procedure well and brought to the recovery room in good condition and subsequently to the floor.  POD #1 BP: 116/47 ; Pulse: 80 ; Temp: 98.1 F (36.7 C) ; Resp: 18 Patient reports pain as mild, pain controlled. No events throughout the night.  Pain is not fully controlled, but she is on regular Oxycodone for chronic back pain.Will adjust her meds to Dilaudid, but discussed getting her back on Oxycodone as soon as possible. Ready to be discharged home. Dorsiflexion/plantar flexion intact, incision: dressing C/D/I, no cellulitis present and compartment soft.   LABS  Basename    HGB     10.9  HCT     36.4    Discharge Exam: General appearance: alert, cooperative and no distress Extremities: Homans sign is negative, no sign of DVT, no edema, redness or tenderness in the calves or thighs and no ulcers, gangrene or trophic changes  Disposition:  Home with follow up in 2 weeks   [place]. Go on 11/13/2018.   Why: You are scheduled for a physical therapy appointment on 11-13-18 at 10:00 am.  Arrival time is 9:40 am.  Contact information: Kimberly Larsen [id number] [phone]  Kimberly Orleans, PA-C. Go on 11/25/2018.   Specialty: Orthopedic Surgery Why: You are scheduled for a post-operative appointment on 11-25-18 at 11:00 am.  Contact information: [address] [place] [address] [phone]           Discharge Instructions    Call MD / Call 911   Complete by: As directed    If you experience chest pain or shortness of breath, CALL 911 and be transported to the hospital emergency room.  If you develope a fever above 101 F, pus (white drainage) or increased  drainage or redness at the wound, or calf pain, call your surgeon's office.   Change dressing   Complete by: As directed    Maintain surgical dressing until follow up in the clinic. If the edges start to pull up, may reinforce with tape. If the dressing is no longer working, may remove and cover with gauze and tape, but must keep the area dry and clean.  Call with any questions or concerns.   Constipation Prevention   Complete by: As directed    Drink plenty of fluids.  Prune juice may be helpful.  You may use a stool softener, such as Colace (over the counter) 100 mg twice a day.  Use MiraLax (over the counter) for constipation as needed.   Diet - low sodium heart healthy   Complete by: As directed    Discharge instructions   Complete by: As directed    Maintain surgical dressing until follow up in the clinic. If the edges start to pull up, may reinforce with tape. If the dressing is no longer working, may remove and cover with gauze and tape, but must keep the area dry and clean.  [facility]. Call with any questions or concerns.   Increase activity slowly as tolerated   Complete by: As directed    Weight bearing as tolerated with assist device (walker, cane, etc) as directed, use it as long as suggested by your surgeon or therapist, typically at least 4-6 weeks.   TED hose   Complete by: As directed    Use stockings (TED hose) for 2 weeks on both leg(s).  You may remove them at night for sleeping.      Allergies as of 11/11/2018      Reactions   Augmentin [amoxicillin-pot Clavulanate] Nausea Only   Wellbutrin [bupropion] Nausea Only      Medication List    STOP taking these medications   aspirin EC 81 MG tablet Replaced by: aspirin 81 MG chewable tablet   oxyCODONE-acetaminophen 10-325 MG tablet Commonly known as: PERCOCET     TAKE these medications   acetaminophen 500 MG tablet Commonly known as: TYLENOL Take 2 tablets (1,000 mg total) by  mouth every 8 (eight) hours.   ALPRAZolam 0.25 MG tablet Commonly known as: XANAX Take 0.25 mg by mouth 3 (three) times daily as needed for anxiety.   aspirin 81 MG chewable tablet Commonly known as: Aspirin Childrens Chew 1 tablet (81 mg total) by mouth 2 (two) times daily. Take for 4 weeks, then resume regular dose. Replaces: aspirin EC 81 MG tablet   B COMPLEX PO Take 1 tablet by mouth daily.   cabergoline 0.5 MG tablet Commonly known as: DOSTINEX Take 1.5 mg by mouth See admin instructions. Patient takes tablets 1.5 mg on Tuesday and friday   docusate sodium 100 MG capsule Commonly known as: Colace Take 1 capsule (100 mg total)  by mouth 2 (two) times daily.   DULoxetine 30 MG capsule Commonly known as: CYMBALTA Take 90 mg by mouth daily.   ferrous sulfate 325 (65 FE) MG tablet Commonly known as: FerrouSul Take 1 tablet (325 mg total) by mouth 3 (three) times daily with meals for 14 days.   Fish Oil 600 MG Caps Take 1 capsule by mouth 2 (two) times daily. What changed: how much to take   fluticasone 50 MCG/ACT nasal spray Commonly known as: FLONASE Place 1 spray into both nostrils daily.   gabapentin 600 MG tablet Commonly known as: NEURONTIN Take 600 mg by mouth 3 (three) times daily.   HAIR SKIN AND NAILS FORMULA PO Take 1 capsule by mouth daily.   HYDROmorphone 2 MG tablet Commonly known as: Dilaudid Take 1-2 tablets (2-4 mg total) by mouth every 4 (four) hours as needed for severe pain.   levothyroxine 50 MCG tablet Commonly known as: SYNTHROID Take 50 mcg by mouth daily before breakfast.   Magnesium 500 MG Tabs Take 250 mg by mouth daily.   Melatonin 10 MG Caps Take 10 mg by mouth at bedtime.   metoprolol succinate 25 MG 24 hr tablet Commonly known as: TOPROL-XL Take 25 mg by mouth daily.   montelukast 10 MG tablet Commonly known as: SINGULAIR Take 10 mg by mouth at bedtime.   multivitamin with minerals Tabs tablet Take 1 tablet by mouth  daily.   Narcan 4 MG/0.1ML Liqd nasal spray kit Generic drug: naloxone Place 1 spray into the nose See admin instructions. Take by nasal route every 3 minutes until patient awakes or EMS arrives.   omeprazole 20 MG capsule Commonly known as: PRILOSEC Take 1 capsule (20 mg total) by mouth daily.   ondansetron 4 MG tablet Commonly known as: ZOFRAN Take 1 tablet by mouth every 6 (six) hours as needed for nausea or vomiting.   polyethylene glycol 17 g packet Commonly known as: MIRALAX / GLYCOLAX Take 17 g by mouth 2 (two) times daily. What changed:   when to take this  reasons to take this   pravastatin 40 MG tablet Commonly known as: PRAVACHOL Take 1 tablet (40 mg total) by mouth at bedtime.   Systane Balance 0.6 % Soln Generic drug: Propylene Glycol Place 1-2 drops into both eyes 4 (four) times daily.   tiZANidine 2 MG tablet Commonly known as: ZANAFLEX Take 1 tablet (2 mg total) by mouth 2 (two) times a day.   traZODone 100 MG tablet Commonly known as: DESYREL Take 100 mg by mouth at bedtime.   Vitamin D3 50 MCG (2000 UT) Tabs Take 2,000 mg by mouth daily at 12 noon.            Discharge Care Instructions  (From admission, onward)         Start     Ordered   11/11/18 0000  Change dressing    Comments: Maintain surgical dressing until follow up in the clinic. If the edges start to pull up, may reinforce with tape. If the dressing is no longer working, may remove and cover with gauze and tape, but must keep the area dry and clean.  Call with any questions or concerns.   11/11/18 3500           Signed: [place]. Terrie Grajales   PA-C  11/17/2018, 8:11 AM

## 2018-11-19 DIAGNOSIS — M25662 Stiffness of left knee, not elsewhere classified: Secondary | ICD-10-CM | POA: Diagnosis not present

## 2018-11-19 DIAGNOSIS — M25562 Pain in left knee: Secondary | ICD-10-CM | POA: Diagnosis not present

## 2018-11-24 DIAGNOSIS — M25562 Pain in left knee: Secondary | ICD-10-CM | POA: Diagnosis not present

## 2018-11-24 DIAGNOSIS — M25662 Stiffness of left knee, not elsewhere classified: Secondary | ICD-10-CM | POA: Diagnosis not present

## 2018-11-26 ENCOUNTER — Other Ambulatory Visit: Payer: Self-pay

## 2018-11-26 ENCOUNTER — Other Ambulatory Visit (HOSPITAL_COMMUNITY): Payer: Self-pay | Admitting: Orthopedic Surgery

## 2018-11-26 ENCOUNTER — Ambulatory Visit (HOSPITAL_COMMUNITY)
Admission: RE | Admit: 2018-11-26 | Discharge: 2018-11-26 | Disposition: A | Discharge status: Home / Self Care | Payer: Medicare Other | Source: Ambulatory Visit | Attending: Internal Medicine | Admitting: Internal Medicine

## 2018-11-26 DIAGNOSIS — M7989 Other specified soft tissue disorders: Secondary | ICD-10-CM

## 2018-11-26 DIAGNOSIS — M79662 Pain in left lower leg: Secondary | ICD-10-CM | POA: Diagnosis not present

## 2018-11-27 DIAGNOSIS — M25562 Pain in left knee: Secondary | ICD-10-CM | POA: Diagnosis not present

## 2018-11-27 DIAGNOSIS — M25662 Stiffness of left knee, not elsewhere classified: Secondary | ICD-10-CM | POA: Diagnosis not present

## 2018-12-01 DIAGNOSIS — M25562 Pain in left knee: Secondary | ICD-10-CM | POA: Diagnosis not present

## 2018-12-01 DIAGNOSIS — M25662 Stiffness of left knee, not elsewhere classified: Secondary | ICD-10-CM | POA: Diagnosis not present

## 2018-12-03 DIAGNOSIS — M25662 Stiffness of left knee, not elsewhere classified: Secondary | ICD-10-CM | POA: Diagnosis not present

## 2018-12-03 DIAGNOSIS — M25562 Pain in left knee: Secondary | ICD-10-CM | POA: Diagnosis not present

## 2018-12-04 ENCOUNTER — Encounter (HOSPITAL_COMMUNITY): Payer: Self-pay | Admitting: Emergency Medicine

## 2018-12-04 ENCOUNTER — Emergency Department (HOSPITAL_COMMUNITY)
Admission: EM | Admit: 2018-12-04 | Discharge: 2018-12-05 | Disposition: A | Discharge status: Home / Self Care | Payer: Medicare Other | Attending: Emergency Medicine | Admitting: Emergency Medicine

## 2018-12-04 ENCOUNTER — Other Ambulatory Visit: Payer: Self-pay

## 2018-12-04 DIAGNOSIS — N390 Urinary tract infection, site not specified: Secondary | ICD-10-CM | POA: Diagnosis not present

## 2018-12-04 DIAGNOSIS — Z87891 Personal history of nicotine dependence: Secondary | ICD-10-CM | POA: Diagnosis not present

## 2018-12-04 DIAGNOSIS — N289 Disorder of kidney and ureter, unspecified: Secondary | ICD-10-CM | POA: Insufficient documentation

## 2018-12-04 DIAGNOSIS — Z79899 Other long term (current) drug therapy: Secondary | ICD-10-CM | POA: Diagnosis not present

## 2018-12-04 DIAGNOSIS — Z7982 Long term (current) use of aspirin: Secondary | ICD-10-CM | POA: Insufficient documentation

## 2018-12-04 DIAGNOSIS — Z87442 Personal history of urinary calculi: Secondary | ICD-10-CM | POA: Insufficient documentation

## 2018-12-04 DIAGNOSIS — R319 Hematuria, unspecified: Secondary | ICD-10-CM | POA: Diagnosis present

## 2018-12-04 DIAGNOSIS — I10 Essential (primary) hypertension: Secondary | ICD-10-CM | POA: Insufficient documentation

## 2018-12-04 DIAGNOSIS — Z8542 Personal history of malignant neoplasm of other parts of uterus: Secondary | ICD-10-CM | POA: Insufficient documentation

## 2018-12-04 LAB — CBC
HCT: 40.8 % (ref 36.0–46.0)
Hemoglobin: 12.6 g/dL (ref 12.0–15.0)
MCH: 30.1 pg (ref 26.0–34.0)
MCHC: 30.9 g/dL (ref 30.0–36.0)
MCV: 97.4 fL (ref 80.0–100.0)
Platelets: 312 10*3/uL (ref 150–400)
RBC: 4.19 MIL/uL (ref 3.87–5.11)
RDW: 13.6 % (ref 11.5–15.5)
WBC: 13.4 10*3/uL — ABNORMAL HIGH (ref 4.0–10.5)
nRBC: 0 % (ref 0.0–0.2)

## 2018-12-04 LAB — URINALYSIS, ROUTINE W REFLEX MICROSCOPIC
Bilirubin Urine: NEGATIVE
Glucose, UA: NEGATIVE mg/dL
Ketones, ur: NEGATIVE mg/dL
Nitrite: NEGATIVE
Protein, ur: 100 mg/dL — AB
Specific Gravity, Urine: 1.024 (ref 1.005–1.030)
WBC, UA: >50 WBC/hpf — ABNORMAL HIGH (ref 0–5)
pH: 5 (ref 5.0–8.0)

## 2018-12-04 LAB — BASIC METABOLIC PANEL
Anion gap: 9 (ref 5–15)
BUN: 21 mg/dL (ref 8–23)
CO2: 31 mmol/L (ref 22–32)
Calcium: 9.7 mg/dL (ref 8.9–10.3)
Chloride: 100 mmol/L (ref 98–111)
Creatinine, Ser: 1.36 mg/dL — ABNORMAL HIGH (ref 0.44–1.00)
GFR calc Af Amer: 46 mL/min — ABNORMAL LOW (ref >60)
GFR calc non Af Amer: 39 mL/min — ABNORMAL LOW (ref >60)
Glucose, Bld: 105 mg/dL — ABNORMAL HIGH (ref 70–99)
Potassium: 4.9 mmol/L (ref 3.5–5.1)
Sodium: 140 mmol/L (ref 135–145)

## 2018-12-04 MED ORDER — NITROFURANTOIN MONOHYD MACRO 100 MG PO CAPS: 100.0000 mg | ORAL_CAPSULE | Freq: Two times a day (BID) | ORAL | 0 refills | Status: DC

## 2018-12-04 MED ORDER — NITROFURANTOIN MONOHYD MACRO 100 MG PO CAPS
100.0000 mg | ORAL_CAPSULE | Freq: Once | ORAL | Status: AC
  Administered 2018-12-05: 100 mg via ORAL
  Filled 2018-12-04: qty 1

## 2018-12-04 MED ORDER — OXYCODONE-ACETAMINOPHEN 5-325 MG PO TABS
1.0000 | ORAL_TABLET | ORAL | Status: DC | PRN
  Administered 2018-12-04: 1 via ORAL
  Filled 2018-12-04: qty 1

## 2018-12-04 NOTE — ED Notes (Signed)
Pt came to [facility] first to request some where to put her legs up and for pain medicine. Assisted Pt in getting a second chair to put her legs up on and RN notified about Pt requesting pain meds.

## 2018-12-04 NOTE — ED Triage Notes (Signed)
Pt here for eval of hematuria and bladder spasms that started today. Pt has pain to left flank.

## 2018-12-04 NOTE — ED Notes (Signed)
Pt approached this tech stating that she recently had knee surgery and is experiencing a lot of pain due to not bringing her pain medication. Pt state that she typically takes a 10mg  percocet and 325 acetaminophen. Triage nurse informed.

## 2018-12-04 NOTE — ED Provider Notes (Signed)
[place] EMERGENCY DEPARTMENT Provider Note   CSN: [id number] Arrival date & time: 12/04/18  1643    History   Chief Complaint Chief Complaint  Patient presents with  . Hematuria    HPI Kimberly Larsen is a 73 y.o. female.   The history is provided by the patient.  Hematuria  She has history of hypertension, hyperlipidemia, endometrial cancer, frequent urinary tract infections and comes in with blood in her urine today.  She is also noted that she had some urge incontinence.  There has been some dull suprapubic pain which she rates at 5/10.  She denies nausea or vomiting and denies fever or chills.  There has also been some mild pain in the left flank.   Past Medical History:  Diagnosis Date  . Anxiety   . Arthritis   . Back pain   . Cancer [facility])    endometrial cancer  . DDD (degenerative disc disease), lumbar   . Depression   . Gastroparesis   . History of kidney stones   . HLD (hyperlipidemia)   . Hx of sepsis 02/2014   DUE TO MULTIPLE KIDNEY STONES  . Hypertension   . Osteoporosis   . Pneumonia    2014  . UTI (urinary tract infection)     Patient Active Problem List   Diagnosis Date Noted  . Obese 11/11/2018  . S/P left TKA 11/10/2018  . Genetic testing 12/17/2017  . Chronic daily headache 10/23/2017  . Pituitary macroadenoma ([place]) 07/23/2017  . UTI (urinary tract infection) 07/10/2017  . Near syncope 07/10/2017  . Lactic acidosis 07/10/2017  . Endometrial cancer ([place]) 01/10/2016  . Severe sepsis with septic shock ([place]) 03/25/2014  . AKI (acute kidney injury) ([place]) 03/25/2014  . Benign essential HTN 03/25/2014  . Obstructive uropathy 03/25/2014  . Hyperlipidemia 03/25/2014  . Acute postoperative respiratory failure ([place]) 03/25/2014  . Chronic pain 02/23/2014  . Fibromyalgia 09/08/2012  . Insomnia 09/08/2012  . Obesity (BMI 30-39.9) 09/02/2012  . Expected blood loss anemia 09/02/2012  . S/P right TKA 08/31/2012  . HYPERTENSION,  BENIGN SYSTEMIC 06/26/2006  . NEPHROLITHIASIS 06/26/2006    Past Surgical History:  Procedure Laterality Date  . BACK SURGERY  2013  . CARPAL TUNNEL RELEASE     R hand  . CYSTOSCOPY WITH URETEROSCOPY AND STENT PLACEMENT Bilateral 03/25/2014   Procedure: CYSTOSCOPY WITH URETEROSCOPY AND STENT PLACEMENT;  Surgeon: Raynelle Bring, MD;  Location: [facility];  Service: Urology;  Laterality: Bilateral;  . CYSTOSCOPY WITH URETEROSCOPY AND STENT PLACEMENT Bilateral 04/18/2014   Procedure: CYSTOSCOPY WITH URETEROSCOPY AND STENT PLACEMENT,RETROGRADE;  Surgeon: Raynelle Bring, MD;  Location: [facility];  Service: Urology;  Laterality: Bilateral;  . CYSTOSCOPY WITH URETEROSCOPY AND STENT PLACEMENT Right 05/30/2014   Procedure: CYSTOSCOPY WITH URETEROSCOPY AND STENT PLACEMENT;  Surgeon: Raynelle Bring, MD;  Location: [facility];  Service: Urology;  Laterality: Right;  . CYSTOSCOPY/RETROGRADE/URETEROSCOPY Left 05/30/2014   Procedure: LEFT RETROGRADE;  Surgeon: Raynelle Bring, MD;  Location: [facility];  Service: Urology;  Laterality: Left;  . EYE SURGERY     cataract surgery bil  . GANGLION CYST EXCISION     L ankle  . HAMMERTOE RECONSTRUCTION WITH WEIL OSTEOTOMY Left 09/05/2016   Procedure: Second Metatarsal Weil Osteotomy and Hammertoe Correction;  Surgeon: Wylene Simmer, MD;  Location: [place];  Service: Orthopedics;  Laterality: Left;  . HOLMIUM LASER APPLICATION Bilateral 63/14/9702   Procedure: HOLMIUM LASER APPLICATION;  Surgeon: Raynelle Bring, MD;  Location: [facility];  Service: Urology;  Laterality: Bilateral;  . JOINT REPLACEMENT     total knee  . LITHOTRIPSY    . LUMBAR FUSION  09/2011  . METATARSAL OSTEOTOMY WITH BUNIONECTOMY Left 09/05/2016   Procedure: Left First Metatarsal Scarf Osteotomy, Modified McBride Bunion Correction;  Surgeon: Wylene Simmer, MD;  Location: [place];  Service: Orthopedics;  Laterality: Left;  . RHINOPLASTY    . ROBOTIC ASSISTED TOTAL HYSTERECTOMY WITH  BILATERAL SALPINGO OOPHERECTOMY Bilateral 01/16/2016   Procedure: XI ROBOTIC ASSISTED TOTAL HYSTERECTOMY WITH BILATERAL SALPINGO OOPHORECTOMY AND BILATERAL PELVIC LYMPH NODE DISSECTION;  Surgeon: Everitt Amber, MD;  Location: [facility];  Service: Gynecology;  Laterality: Bilateral;  . SPINAL CORD STIMULATOR INSERTION N/A 02/23/2014   Procedure: SPINAL CORD STIMULATOR PLACEMENT ;  Surgeon: Melina Schools, MD;  Location: [place];  Service: Orthopedics;  Laterality: N/A;  . TONSILLECTOMY    . TOTAL KNEE ARTHROPLASTY Right 08/31/2012   Procedure: RIGHT TOTAL KNEE ARTHROPLASTY;  Surgeon: Mauri Pole, MD;  Location[facility];  Service: Orthopedics;  Laterality: Right;  with LMA  . TOTAL KNEE ARTHROPLASTY Left 11/10/2018   Procedure: TOTAL KNEE ARTHROPLASTY;  Surgeon: Paralee Cancel, MD;  Location: [facility];  Service: Orthopedics;  Laterality: Left;  70 mins  . TUBAL LIGATION       OB History   No obstetric history on file.      Home Medications    Prior to Admission medications   Medication Sig Start Date End Date Taking? Authorizing Provider  acetaminophen (TYLENOL) 500 MG tablet Take 2 tablets (1,000 mg total) by mouth every 8 (eight) hours. 11/11/18   Danae Orleans, PA-C  ALPRAZolam Duanne Moron) 0.25 MG tablet Take 0.25 mg by mouth 3 (three) times daily as needed for anxiety.  05/30/17   [provider]  aspirin (ASPIRIN CHILDRENS) 81 MG chewable tablet Chew 1 tablet (81 mg total) by mouth 2 (two) times daily. Take for 4 weeks, then resume regular dose. 11/11/18 12/11/18  Danae Orleans, PA-C  B Complex Vitamins (B COMPLEX PO) Take 1 tablet by mouth daily.    [provider]  cabergoline (DOSTINEX) 0.5 MG tablet Take 1.5 mg by mouth See admin instructions. Patient takes tablets 1.5 mg on Tuesday and friday    [provider]  Cholecalciferol (VITAMIN D3) 50 MCG (2000 UT) TABS Take 2,000 mg by mouth daily at 12 noon.    [provider]  docusate sodium (COLACE) 100 MG capsule  Take 1 capsule (100 mg total) by mouth 2 (two) times daily. 11/11/18   Danae Orleans, PA-C  DULoxetine (CYMBALTA) 30 MG capsule Take 90 mg by mouth daily.     [provider]  ferrous sulfate (FERROUSUL) 325 (65 FE) MG tablet Take 1 tablet (325 mg total) by mouth 3 (three) times daily with meals for 14 days. 11/11/18 11/25/18  Danae Orleans, PA-C  fluticasone (FLONASE) 50 MCG/ACT nasal spray Place 1 spray into both nostrils daily.    [provider]  gabapentin (NEURONTIN) 600 MG tablet Take 600 mg by mouth 3 (three) times daily.    [provider]  HYDROmorphone (DILAUDID) 2 MG tablet Take 1-2 tablets (2-4 mg total) by mouth every 4 (four) hours as needed for severe pain. 11/11/18   Danae Orleans, PA-C  levothyroxine (SYNTHROID, LEVOTHROID) 50 MCG tablet Take 50 mcg by mouth daily before breakfast.  10/12/17   [provider]  Magnesium 500 MG TABS Take 250 mg by mouth daily.     [provider]  Melatonin 10 MG CAPS Take 10 mg by mouth at bedtime.    [provider]  metoprolol succinate (TOPROL-XL) 25 MG 24 hr tablet Take 25 mg by mouth daily.  06/15/14   [provider]  montelukast (SINGULAIR) 10 MG tablet Take 10 mg by mouth at bedtime.  10/06/17   [provider]  Multiple Vitamin (MULTIVITAMIN WITH MINERALS) TABS Take 1 tablet by mouth daily. 09/11/12   Judeth Cornfield, NP  Multiple Vitamins-Minerals (HAIR SKIN AND NAILS FORMULA PO) Take 1 capsule by mouth daily.     [provider]  naloxone [facility]) nasal spray 4 mg/0.1 mL Place 1 spray into the nose See admin instructions. Take by nasal route every 3 minutes until patient awakes or EMS arrives.    [provider]  Omega-3 Fatty Acids (FISH OIL) 600 MG CAPS Take 1 capsule by mouth 2 (two) times daily. Patient taking differently: Take 1,000 mg by mouth 2 (two) times daily.  09/11/12   Judeth Cornfield, NP  omeprazole (PRILOSEC) 20 MG capsule Take 1 capsule (20  mg total) by mouth daily. 11/15/17   Khatri, Hina, PA-C  ondansetron (ZOFRAN) 4 MG tablet Take 1 tablet by mouth every 6 (six) hours as needed for nausea or vomiting.     [provider]  polyethylene glycol (MIRALAX / GLYCOLAX) 17 g packet Take 17 g by mouth 2 (two) times daily. 11/11/18   Danae Orleans, PA-C  pravastatin (PRAVACHOL) 40 MG tablet Take 1 tablet (40 mg total) by mouth at bedtime. 09/11/12   Judeth Cornfield, NP  Propylene Glycol (SYSTANE BALANCE) 0.6 % SOLN Place 1-2 drops into both eyes 4 (four) times daily.     [provider]  tiZANidine (ZANAFLEX) 2 MG tablet Take 1 tablet (2 mg total) by mouth 2 (two) times a day. 11/11/18   Danae Orleans, PA-C  traZODone (DESYREL) 100 MG tablet Take 100 mg by mouth at bedtime.    [provider]    Family History Family History  Problem Relation Age of Onset  . Stroke Mother   . Emphysema Mother   . COPD Mother   . Stroke Father   . Heart disease Father   . Emphysema Father     Social History Social History   Tobacco Use  . Smoking status: Never Smoker  . Smokeless tobacco: Never Used  Substance Use Topics  . Alcohol use: No  . Drug use: No     Allergies   Augmentin [amoxicillin-pot clavulanate] and Wellbutrin [bupropion]   Review of Systems Review of Systems  Genitourinary: Positive for hematuria.  All other systems reviewed and are negative.    Physical Exam Updated Vital Signs BP (!) 151/64 (BP Location: Left Arm)   Pulse 80   Temp 98.1 F (36.7 C) (Oral)   Resp 18   SpO2 97%   Physical Exam Vitals signs and nursing note reviewed.    73 year old female, resting comfortably and in no acute distress. Vital signs are significant for elevated blood pressure. Oxygen saturation is 97%, which is normal. Head is normocephalic and atraumatic. PERRLA, EOMI. Oropharynx is clear. Neck is nontender and supple without adenopathy or JVD. Back is nontender in the midline.  There is mild left  CVA tenderness. Lungs are clear without rales, wheezes, or rhonchi. Chest is nontender. Heart has regular rate and rhythm without murmur. Abdomen is soft, flat, nontender without masses or hepatosplenomegaly and peristalsis is normoactive. Extremities have no cyanosis or edema,  full range of motion is present. Skin is warm and dry without rash. Neurologic: Mental status is normal, cranial nerves are intact, there are no motor or sensory deficits.  ED Treatments / Results  Labs (all labs ordered are listed, but only abnormal results are displayed) Labs Reviewed  URINALYSIS, ROUTINE W REFLEX MICROSCOPIC - Abnormal; Notable for the following components:      Result Value   APPearance TURBID (*)    Hgb urine dipstick LARGE (*)    Protein, ur 100 (*)    Leukocytes,Ua MODERATE (*)    WBC, UA >50 (*)    Bacteria, UA FEW (*)    All other components within normal limits  CBC - Abnormal; Notable for the following components:   WBC 13.4 (*)    All other components within normal limits  BASIC METABOLIC PANEL - Abnormal; Notable for the following components:   Glucose, Bld 105 (*)    Creatinine, Ser 1.36 (*)    GFR calc non Af Amer 39 (*)    GFR calc Af Amer 46 (*)    All other components within normal limits   Procedures Procedures   Medications Ordered in ED Medications  oxyCODONE-acetaminophen (PERCOCET/ROXICET) 5-325 MG per tablet 1 tablet (1 tablet Oral Given 12/04/18 2047)  nitrofurantoin (macrocrystal-monohydrate) (MACROBID) capsule 100 mg (has no administration in time range)     Initial Impression / Assessment and Plan / ED Course  I have reviewed the triage vital signs and the nursing notes.  Pertinent lab results that were available during my care of the patient were reviewed by me and considered in my medical decision making (see chart for details).  Urinary tract infection with hematuria.  Urinalysis shows 11-20 RBCs and >50 WBCs.  Old records are reviewed, and she has  had several urinary tract infections which have usually been due to enterococcus.  All of her previous isolates have been sensitive to nitrofurantoin so she is discharged with prescription for same.  Urine is sent for culture.  Labs do show some mild renal insufficiency.  Patient is advised to drink lots of fluids and follow-up with PCP in 1 week at which time creatinine can be rechecked.  Return precautions discussed.  Final Clinical Impressions(s) / ED Diagnoses   Final diagnoses:  Urinary tract infection with hematuria, site unspecified  Renal insufficiency    ED Discharge Orders         Ordered    nitrofurantoin, macrocrystal-monohydrate, (MACROBID) 100 MG capsule  2 times daily     12/04/18 9977           Delora Fuel, MD 41/42/39 0001

## 2018-12-04 NOTE — Discharge Instructions (Signed)
Drink plenty of fluids.  Take acetaminophen 500 mg as needed for pain.  Return if you start running a fever or start vomiting.  Culture report should be available on Monday. If it shows you need to be on a different antibiotic, we will call you.

## 2018-12-05 DIAGNOSIS — N390 Urinary tract infection, site not specified: Secondary | ICD-10-CM | POA: Diagnosis not present

## 2018-12-06 DIAGNOSIS — R531 Weakness: Secondary | ICD-10-CM | POA: Diagnosis not present

## 2018-12-06 DIAGNOSIS — Z03818 Encounter for observation for suspected exposure to other biological agents ruled out: Secondary | ICD-10-CM | POA: Diagnosis not present

## 2018-12-06 DIAGNOSIS — M542 Cervicalgia: Secondary | ICD-10-CM | POA: Diagnosis not present

## 2018-12-06 DIAGNOSIS — B9620 Unspecified Escherichia coli [E. coli] as the cause of diseases classified elsewhere: Secondary | ICD-10-CM | POA: Diagnosis present

## 2018-12-06 DIAGNOSIS — R509 Fever, unspecified: Secondary | ICD-10-CM | POA: Diagnosis not present

## 2018-12-06 DIAGNOSIS — N390 Urinary tract infection, site not specified: Secondary | ICD-10-CM | POA: Diagnosis not present

## 2018-12-06 DIAGNOSIS — I10 Essential (primary) hypertension: Secondary | ICD-10-CM | POA: Diagnosis not present

## 2018-12-06 DIAGNOSIS — G8929 Other chronic pain: Secondary | ICD-10-CM | POA: Diagnosis present

## 2018-12-06 DIAGNOSIS — R0902 Hypoxemia: Secondary | ICD-10-CM | POA: Diagnosis not present

## 2018-12-06 DIAGNOSIS — E039 Hypothyroidism, unspecified: Secondary | ICD-10-CM | POA: Diagnosis present

## 2018-12-06 DIAGNOSIS — Z79899 Other long term (current) drug therapy: Secondary | ICD-10-CM | POA: Diagnosis not present

## 2018-12-06 DIAGNOSIS — R791 Abnormal coagulation profile: Secondary | ICD-10-CM | POA: Diagnosis present

## 2018-12-06 DIAGNOSIS — R52 Pain, unspecified: Secondary | ICD-10-CM | POA: Diagnosis not present

## 2018-12-06 DIAGNOSIS — N179 Acute kidney failure, unspecified: Secondary | ICD-10-CM | POA: Diagnosis not present

## 2018-12-06 DIAGNOSIS — R6520 Severe sepsis without septic shock: Secondary | ICD-10-CM | POA: Diagnosis not present

## 2018-12-06 DIAGNOSIS — N3001 Acute cystitis with hematuria: Secondary | ICD-10-CM | POA: Diagnosis not present

## 2018-12-06 DIAGNOSIS — E785 Hyperlipidemia, unspecified: Secondary | ICD-10-CM | POA: Diagnosis not present

## 2018-12-06 DIAGNOSIS — W19XXXA Unspecified fall, initial encounter: Secondary | ICD-10-CM | POA: Diagnosis not present

## 2018-12-06 DIAGNOSIS — A419 Sepsis, unspecified organism: Secondary | ICD-10-CM | POA: Diagnosis not present

## 2018-12-06 DIAGNOSIS — Z7982 Long term (current) use of aspirin: Secondary | ICD-10-CM | POA: Diagnosis not present

## 2018-12-06 DIAGNOSIS — S299XXA Unspecified injury of thorax, initial encounter: Secondary | ICD-10-CM | POA: Diagnosis not present

## 2018-12-06 DIAGNOSIS — R0682 Tachypnea, not elsewhere classified: Secondary | ICD-10-CM | POA: Diagnosis present

## 2018-12-06 DIAGNOSIS — E860 Dehydration: Secondary | ICD-10-CM | POA: Diagnosis not present

## 2018-12-06 DIAGNOSIS — K219 Gastro-esophageal reflux disease without esophagitis: Secondary | ICD-10-CM | POA: Diagnosis present

## 2018-12-06 DIAGNOSIS — A4150 Gram-negative sepsis, unspecified: Secondary | ICD-10-CM | POA: Diagnosis not present

## 2018-12-06 DIAGNOSIS — Z79891 Long term (current) use of opiate analgesic: Secondary | ICD-10-CM | POA: Diagnosis not present

## 2018-12-06 DIAGNOSIS — R42 Dizziness and giddiness: Secondary | ICD-10-CM | POA: Diagnosis not present

## 2018-12-06 DIAGNOSIS — F418 Other specified anxiety disorders: Secondary | ICD-10-CM | POA: Diagnosis present

## 2018-12-07 DIAGNOSIS — A4150 Gram-negative sepsis, unspecified: Secondary | ICD-10-CM

## 2018-12-07 DIAGNOSIS — N179 Acute kidney failure, unspecified: Secondary | ICD-10-CM | POA: Diagnosis not present

## 2018-12-07 DIAGNOSIS — E860 Dehydration: Secondary | ICD-10-CM | POA: Diagnosis not present

## 2018-12-07 DIAGNOSIS — N390 Urinary tract infection, site not specified: Secondary | ICD-10-CM | POA: Diagnosis not present

## 2018-12-07 DIAGNOSIS — I10 Essential (primary) hypertension: Secondary | ICD-10-CM | POA: Diagnosis not present

## 2018-12-07 LAB — URINE CULTURE: Culture: >=100000 — AB

## 2018-12-08 ENCOUNTER — Telehealth: Payer: Self-pay | Admitting: Emergency Medicine

## 2018-12-08 NOTE — Telephone Encounter (Signed)
Post ED Visit - Positive Culture Follow-up  Culture report reviewed by antimicrobial stewardship pharmacist: [place] Team []  Elenor Quinones, Pharm.D. []  Heide Guile, Pharm.D., BCPS AQ-ID []  Parks Neptune, Pharm.D., BCPS []  Alycia Rossetti, Pharm.D., BCPS []  [place], Pharm.D., BCPS, AAHIVP []  Legrand Como, Pharm.D., BCPS, AAHIVP []  Salome Arnt, PharmD, BCPS []  Johnnette Gourd, PharmD, BCPS []  Hughes Better, PharmD, BCPS []  Leeroy Cha, PharmD []  Laqueta Linden, PharmD, BCPS []  Albertina Parr, PharmD Eddie Candle PharmD  [place] Team []  Leodis Sias, PharmD []  Lindell Spar, PharmD []  Royetta Asal, PharmD []  Graylin Shiver, Rph []  Rema Fendt) Glennon Mac, PharmD []  Arlyn Dunning, PharmD []  Netta Cedars, PharmD []  Dia Sitter, PharmD []  Leone Haven, PharmD []  Gretta Arab, PharmD []  Theodis Shove, PharmD []  Peggyann Juba, PharmD []  Reuel Boom, PharmD   Positive urine culture Treated with nitrofurantoin, organism sensitive to the same and no further patient follow-up is required at this time.  Hazle Nordmann 12/08/2018, 12:21 PM

## 2018-12-09 ENCOUNTER — Ambulatory Visit: Payer: Medicare Other | Admitting: Gynecologic Oncology

## 2018-12-10 DIAGNOSIS — M25562 Pain in left knee: Secondary | ICD-10-CM | POA: Diagnosis not present

## 2018-12-10 DIAGNOSIS — M25662 Stiffness of left knee, not elsewhere classified: Secondary | ICD-10-CM | POA: Diagnosis not present

## 2018-12-11 DIAGNOSIS — N3000 Acute cystitis without hematuria: Secondary | ICD-10-CM | POA: Diagnosis not present

## 2018-12-14 DIAGNOSIS — R319 Hematuria, unspecified: Secondary | ICD-10-CM | POA: Diagnosis not present

## 2018-12-14 DIAGNOSIS — Z09 Encounter for follow-up examination after completed treatment for conditions other than malignant neoplasm: Secondary | ICD-10-CM | POA: Diagnosis not present

## 2018-12-14 DIAGNOSIS — Z79899 Other long term (current) drug therapy: Secondary | ICD-10-CM | POA: Diagnosis not present

## 2018-12-14 DIAGNOSIS — N390 Urinary tract infection, site not specified: Secondary | ICD-10-CM | POA: Diagnosis not present

## 2018-12-14 DIAGNOSIS — Z6838 Body mass index (BMI) 38.0-38.9, adult: Secondary | ICD-10-CM | POA: Diagnosis not present

## 2018-12-15 DIAGNOSIS — M25562 Pain in left knee: Secondary | ICD-10-CM | POA: Diagnosis not present

## 2018-12-15 DIAGNOSIS — M25662 Stiffness of left knee, not elsewhere classified: Secondary | ICD-10-CM | POA: Diagnosis not present

## 2018-12-17 DIAGNOSIS — M25662 Stiffness of left knee, not elsewhere classified: Secondary | ICD-10-CM | POA: Diagnosis not present

## 2018-12-17 DIAGNOSIS — M25562 Pain in left knee: Secondary | ICD-10-CM | POA: Diagnosis not present

## 2018-12-18 DIAGNOSIS — E7800 Pure hypercholesterolemia, unspecified: Secondary | ICD-10-CM | POA: Diagnosis not present

## 2018-12-18 DIAGNOSIS — R7301 Impaired fasting glucose: Secondary | ICD-10-CM | POA: Diagnosis not present

## 2018-12-18 DIAGNOSIS — Z79899 Other long term (current) drug therapy: Secondary | ICD-10-CM | POA: Diagnosis not present

## 2018-12-22 DIAGNOSIS — M25562 Pain in left knee: Secondary | ICD-10-CM | POA: Diagnosis not present

## 2018-12-22 DIAGNOSIS — M25662 Stiffness of left knee, not elsewhere classified: Secondary | ICD-10-CM | POA: Diagnosis not present

## 2018-12-23 DIAGNOSIS — Z0000 Encounter for general adult medical examination without abnormal findings: Secondary | ICD-10-CM | POA: Diagnosis not present

## 2018-12-23 DIAGNOSIS — E2839 Other primary ovarian failure: Secondary | ICD-10-CM | POA: Diagnosis not present

## 2018-12-23 DIAGNOSIS — Z1231 Encounter for screening mammogram for malignant neoplasm of breast: Secondary | ICD-10-CM | POA: Diagnosis not present

## 2018-12-23 DIAGNOSIS — Z1331 Encounter for screening for depression: Secondary | ICD-10-CM | POA: Diagnosis not present

## 2018-12-23 DIAGNOSIS — E669 Obesity, unspecified: Secondary | ICD-10-CM | POA: Diagnosis not present

## 2018-12-23 DIAGNOSIS — Z1339 Encounter for screening examination for other mental health and behavioral disorders: Secondary | ICD-10-CM | POA: Diagnosis not present

## 2018-12-23 DIAGNOSIS — Z6838 Body mass index (BMI) 38.0-38.9, adult: Secondary | ICD-10-CM | POA: Diagnosis not present

## 2018-12-24 DIAGNOSIS — Z471 Aftercare following joint replacement surgery: Secondary | ICD-10-CM | POA: Diagnosis not present

## 2018-12-24 DIAGNOSIS — Z96652 Presence of left artificial knee joint: Secondary | ICD-10-CM | POA: Diagnosis not present

## 2018-12-24 DIAGNOSIS — M25562 Pain in left knee: Secondary | ICD-10-CM | POA: Diagnosis not present

## 2018-12-24 DIAGNOSIS — M25662 Stiffness of left knee, not elsewhere classified: Secondary | ICD-10-CM | POA: Diagnosis not present

## 2018-12-29 DIAGNOSIS — M25562 Pain in left knee: Secondary | ICD-10-CM | POA: Diagnosis not present

## 2018-12-29 DIAGNOSIS — M25662 Stiffness of left knee, not elsewhere classified: Secondary | ICD-10-CM | POA: Diagnosis not present

## 2018-12-31 DIAGNOSIS — M25662 Stiffness of left knee, not elsewhere classified: Secondary | ICD-10-CM | POA: Diagnosis not present

## 2018-12-31 DIAGNOSIS — M25562 Pain in left knee: Secondary | ICD-10-CM | POA: Diagnosis not present

## 2019-01-05 DIAGNOSIS — M25562 Pain in left knee: Secondary | ICD-10-CM | POA: Diagnosis not present

## 2019-01-05 DIAGNOSIS — M25662 Stiffness of left knee, not elsewhere classified: Secondary | ICD-10-CM | POA: Diagnosis not present

## 2019-01-07 DIAGNOSIS — M25662 Stiffness of left knee, not elsewhere classified: Secondary | ICD-10-CM | POA: Diagnosis not present

## 2019-01-07 DIAGNOSIS — M25562 Pain in left knee: Secondary | ICD-10-CM | POA: Diagnosis not present

## 2019-01-14 DIAGNOSIS — Z23 Encounter for immunization: Secondary | ICD-10-CM | POA: Diagnosis not present

## 2019-01-22 ENCOUNTER — Other Ambulatory Visit: Payer: Self-pay

## 2019-01-22 ENCOUNTER — Inpatient Hospital Stay: Payer: Medicare Other | Attending: Gynecologic Oncology | Admitting: Gynecologic Oncology

## 2019-01-22 ENCOUNTER — Encounter: Payer: Self-pay | Admitting: Gynecologic Oncology

## 2019-01-22 VITALS — BP 150/63 | HR 70 | Temp 98.3°F | Resp 18 | Ht 62.0 in | Wt 205.0 lb

## 2019-01-22 DIAGNOSIS — Z90710 Acquired absence of both cervix and uterus: Secondary | ICD-10-CM

## 2019-01-22 DIAGNOSIS — F419 Anxiety disorder, unspecified: Secondary | ICD-10-CM | POA: Diagnosis not present

## 2019-01-22 DIAGNOSIS — F329 Major depressive disorder, single episode, unspecified: Secondary | ICD-10-CM | POA: Insufficient documentation

## 2019-01-22 DIAGNOSIS — I10 Essential (primary) hypertension: Secondary | ICD-10-CM | POA: Insufficient documentation

## 2019-01-22 DIAGNOSIS — Z79899 Other long term (current) drug therapy: Secondary | ICD-10-CM | POA: Diagnosis not present

## 2019-01-22 DIAGNOSIS — E785 Hyperlipidemia, unspecified: Secondary | ICD-10-CM | POA: Insufficient documentation

## 2019-01-22 DIAGNOSIS — C541 Malignant neoplasm of endometrium: Secondary | ICD-10-CM

## 2019-01-22 DIAGNOSIS — L680 Hirsutism: Secondary | ICD-10-CM | POA: Diagnosis not present

## 2019-01-22 DIAGNOSIS — Z90722 Acquired absence of ovaries, bilateral: Secondary | ICD-10-CM | POA: Diagnosis not present

## 2019-01-22 DIAGNOSIS — M5136 Other intervertebral disc degeneration, lumbar region: Secondary | ICD-10-CM | POA: Diagnosis not present

## 2019-01-22 DIAGNOSIS — M810 Age-related osteoporosis without current pathological fracture: Secondary | ICD-10-CM | POA: Insufficient documentation

## 2019-01-22 DIAGNOSIS — K3184 Gastroparesis: Secondary | ICD-10-CM | POA: Insufficient documentation

## 2019-01-22 DIAGNOSIS — M1990 Unspecified osteoarthritis, unspecified site: Secondary | ICD-10-CM | POA: Diagnosis not present

## 2019-01-22 NOTE — Progress Notes (Signed)
Follow-up Note: Gyn-Onc  Kimberly Larsen 73 y.o. female  CC:  Chief Complaint  Patient presents with  . endometrial cancer   Assessment/Plan:  73 year old with stage IA grade 1 endometrioid endometrial adenocarcinoma s/p surgical staging on 01/16/16.  Her endometrial biopsy (originally read as high grade serous) has been reviewed and felt to be more consistent with moderately differentiated endometrial cancer.  Therefore, given the dominant grade 1 disease in her hystectomy specimen, she was felt to have a low risk cancer.  Pathology revealed low risk factors for recurrence, therefore no adjuvant therapy was recommended according to NCCN guidelines.  I discussed risk for recurrence and typical symptoms encouraged her to notify us of these should they develop between visits.  I recommend she continue to have follow-up every 6 months for 5 years in accordance with NCCN guidelines. Those visits should include symptom assessment, physical exam and pelvic examination. Pap smears are not indicated or recommended in the routine surveillance of endometrial cancer. I recommend she return to see Dr Pamala Hurry in 6 months and myself in 12 months.   Hirsuitism - hx of pituitary tumor but her prolactin levels are normal. Will send free testosterone to see if this is elevated.    HPI: Patient was originally seen on 01/10/16 in consultation at the request of Dr. Pamala Hurry.   Patient is a very pleasant 73 year old gravida 1 para 1 who has a long history of renal stones and has been seen by urology. She presented to urology and was seen by Dr. Alinda Money on August 30 for complaints of gross hematuria and right lower quadrant pain. However at that time she was noted to have vaginal bleeding and her bladder catheterization revealed no blood within the bladder. She was seen by Dr. Valentino Saxon on 12/29/2015. At that time an endometrial biopsy was performed as well as a Pap smear. Pap smear was unremarkable. Endometrial  biopsy revealed a high-grade uterine serous carcinoma. Prior to this episode of bleeding she had not had any menses for approximately 16 years.  On 01/16/16 she underwent robotic assisted total hysterectomy, BSO and pelvic lymphadenectomy. Final pathology revealed stage IA grade 1 endometrial cancer with inner half myometrial invasion and no LVSI. She was noted to have low risk factors of final pathology and therefore no adjuvant therapy was recommended in accordance with NCCN guidelines.  Interval Hx:  She presents today for routine follow-up.  She has been diagnosed with a pituitary tumor (treated medically) and S1 neuropathy since I last saw her. No symptoms of recurrence.  She reports increasing hirsuitism. This is despite her prolactinoma being stable (based on serum prolactin levels).   Review of Systems  Constitutional:  Denies fever. Skin: No rash, + hirsuitism Cardiovascular: No chest pain, shortness of breath except as above, or edema  Pulmonary: No cough  Gastro Intestinal: Slight nausea, vomiting, constipation, or diarrhea reported. No change in bowel movement.  Genitourinary: No frequency, urgency, or dysuria.  no vaginal bleeding, no discharge.  Musculoskeletal: + Low back pain and joint pain in her knees.  Neurologic: No weakness, numbness, or change in gait.  Psychology: Anxious  Current Meds:  Outpatient Encounter Medications as of 01/22/2019  Medication Sig  . ALPRAZolam (XANAX) 0.25 MG tablet Take 0.25 mg by mouth 3 (three) times daily as needed for anxiety.   . B Complex Vitamins (B COMPLEX PO) Take 1 tablet by mouth daily.  . cabergoline (DOSTINEX) 0.5 MG tablet Take 1.5 mg by mouth See admin instructions. Patient takes tablets  1.5 mg on Tuesday and friday  . Cholecalciferol (VITAMIN D3) 50 MCG (2000 UT) TABS Take 2,000 mg by mouth daily at 12 noon.  . docusate sodium (COLACE) 100 MG capsule Take 1 capsule (100 mg total) by mouth 2 (two) times daily.  . DULoxetine  (CYMBALTA) 30 MG capsule Take 90 mg by mouth daily.   . fluticasone (FLONASE) 50 MCG/ACT nasal spray Place 1 spray into both nostrils daily.  Marland Kitchen gabapentin (NEURONTIN) 600 MG tablet Take 600 mg by mouth 3 (three) times daily.  Marland Kitchen levothyroxine (SYNTHROID, LEVOTHROID) 50 MCG tablet Take 50 mcg by mouth daily before breakfast.   . Magnesium 500 MG TABS Take 250 mg by mouth daily.   . Melatonin 10 MG CAPS Take 10 mg by mouth at bedtime.  . metoprolol succinate (TOPROL-XL) 25 MG 24 hr tablet Take 25 mg by mouth daily.   . montelukast (SINGULAIR) 10 MG tablet Take 10 mg by mouth at bedtime.   . Multiple Vitamin (MULTIVITAMIN WITH MINERALS) TABS Take 1 tablet by mouth daily.  . Multiple Vitamins-Minerals (HAIR SKIN AND NAILS FORMULA PO) Take 1 capsule by mouth daily.   . Omega-3 Fatty Acids (FISH OIL) 600 MG CAPS Take 1 capsule by mouth 2 (two) times daily. (Patient taking differently: Take 1,000 mg by mouth 2 (two) times daily. )  . omeprazole (PRILOSEC) 20 MG capsule Take 1 capsule (20 mg total) by mouth daily.  . ondansetron (ZOFRAN) 4 MG tablet Take 1 tablet by mouth every 6 (six) hours as needed for nausea or vomiting.   . polyethylene glycol (MIRALAX / GLYCOLAX) 17 g packet Take 17 g by mouth 2 (two) times daily.  . pravastatin (PRAVACHOL) 40 MG tablet Take 1 tablet (40 mg total) by mouth at bedtime.  Marland Kitchen Propylene Glycol (SYSTANE BALANCE) 0.6 % SOLN Place 1-2 drops into both eyes 4 (four) times daily.   Marland Kitchen tiZANidine (ZANAFLEX) 2 MG tablet Take 1 tablet (2 mg total) by mouth 2 (two) times a day.  . traZODone (DESYREL) 100 MG tablet Take 100 mg by mouth at bedtime.  . ferrous sulfate (FERROUSUL) 325 (65 FE) MG tablet Take 1 tablet (325 mg total) by mouth 3 (three) times daily with meals for 14 days.  . naloxone (NARCAN) nasal spray 4 mg/0.1 mL Place 1 spray into the nose See admin instructions. Take by nasal route every 3 minutes until patient awakes or EMS arrives.  . nitrofurantoin,  macrocrystal-monohydrate, (MACROBID) 100 MG capsule Take 1 capsule (100 mg total) by mouth 2 (two) times daily. (Patient not taking: Reported on 01/22/2019)  . [DISCONTINUED] acetaminophen (TYLENOL) 500 MG tablet Take 2 tablets (1,000 mg total) by mouth every 8 (eight) hours. (Patient not taking: Reported on 01/22/2019)  . [DISCONTINUED] HYDROmorphone (DILAUDID) 2 MG tablet Take 1-2 tablets (2-4 mg total) by mouth every 4 (four) hours as needed for severe pain. (Patient not taking: Reported on 01/22/2019)   No facility-administered encounter medications on file as of 01/22/2019.     Allergy:  Allergies  Allergen Reactions  . Augmentin [Amoxicillin-Pot Clavulanate] Nausea Only  . Wellbutrin [Bupropion] Nausea Only    Social Hx:   Social History   Socioeconomic History  . Marital status: Divorced    Spouse name: Not on file  . Number of children: Not on file  . Years of education: Not on file  . Highest education level: Not on file  Occupational History  . Not on file  Social Needs  . Financial resource strain:  Not on file  . Food insecurity    Worry: Not on file    Inability: Not on file  . Transportation needs    Medical: Not on file    Non-medical: Not on file  Tobacco Use  . Smoking status: Never Smoker  . Smokeless tobacco: Never Used  Substance and Sexual Activity  . Alcohol use: No  . Drug use: No  . Sexual activity: Yes  Lifestyle  . Physical activity    Days per week: Not on file    Minutes per session: Not on file  . Stress: Not on file  Relationships  . Social Herbalist on phone: Not on file    Gets together: Not on file    Attends religious service: Not on file    Active member of club or organization: Not on file    Attends meetings of clubs or organizations: Not on file    Relationship status: Not on file  . Intimate partner violence    Fear of current or ex partner: Not on file    Emotionally abused: Not on file    Physically abused: Not on  file    Forced sexual activity: Not on file  Other Topics Concern  . Not on file  Social History Narrative  . Not on file    Past Surgical Hx:  Past Surgical History:  Procedure Laterality Date  . BACK SURGERY  2013  . CARPAL TUNNEL RELEASE     R hand  . CYSTOSCOPY WITH URETEROSCOPY AND STENT PLACEMENT Bilateral 03/25/2014   Procedure: CYSTOSCOPY WITH URETEROSCOPY AND STENT PLACEMENT;  Surgeon: Raynelle Bring, MD;  Location: [facility];  Service: Urology;  Laterality: Bilateral;  . CYSTOSCOPY WITH URETEROSCOPY AND STENT PLACEMENT Bilateral 04/18/2014   Procedure: CYSTOSCOPY WITH URETEROSCOPY AND STENT PLACEMENT,RETROGRADE;  Surgeon: Raynelle Bring, MD;  Location: [facility];  Service: Urology;  Laterality: Bilateral;  . CYSTOSCOPY WITH URETEROSCOPY AND STENT PLACEMENT Right 05/30/2014   Procedure: CYSTOSCOPY WITH URETEROSCOPY AND STENT PLACEMENT;  Surgeon: Raynelle Bring, MD;  Location: [facility];  Service: Urology;  Laterality: Right;  . CYSTOSCOPY/RETROGRADE/URETEROSCOPY Left 05/30/2014   Procedure: LEFT RETROGRADE;  Surgeon: Raynelle Bring, MD;  Location: [facility];  Service: Urology;  Laterality: Left;  . EYE SURGERY     cataract surgery bil  . GANGLION CYST EXCISION     L ankle  . HAMMERTOE RECONSTRUCTION WITH WEIL OSTEOTOMY Left 09/05/2016   Procedure: Second Metatarsal Weil Osteotomy and Hammertoe Correction;  Surgeon: Wylene Simmer, MD;  Location: [facility];  Service: Orthopedics;  Laterality: Left;  . HOLMIUM LASER APPLICATION Bilateral 99/24/2683   Procedure: HOLMIUM LASER APPLICATION;  Surgeon: Raynelle Bring, MD;  Location: [facility];  Service: Urology;  Laterality: Bilateral;  . JOINT REPLACEMENT     total knee  . LITHOTRIPSY    . LUMBAR FUSION  09/2011  . METATARSAL OSTEOTOMY WITH BUNIONECTOMY Left 09/05/2016   Procedure: Left First Metatarsal Scarf Osteotomy, Modified McBride Bunion Correction;  Surgeon: Wylene Simmer, MD;  Location: [facility];  Service:  Orthopedics;  Laterality: Left;  . RHINOPLASTY    . ROBOTIC ASSISTED TOTAL HYSTERECTOMY WITH BILATERAL SALPINGO OOPHERECTOMY Bilateral 01/16/2016   Procedure: XI ROBOTIC ASSISTED TOTAL HYSTERECTOMY WITH BILATERAL SALPINGO OOPHORECTOMY AND BILATERAL PELVIC LYMPH NODE DISSECTION;  Surgeon: Everitt Amber, MD;  Location: [facility];  Service: Gynecology;  Laterality: Bilateral;  . SPINAL CORD STIMULATOR INSERTION N/A 02/23/2014   Procedure: SPINAL CORD STIMULATOR PLACEMENT ;  Surgeon:  Melina Schools, MD;  Location: [facility];  Service: Orthopedics;  Laterality: N/A;  . TONSILLECTOMY    . TOTAL KNEE ARTHROPLASTY Right 08/31/2012   Procedure: RIGHT TOTAL KNEE ARTHROPLASTY;  Surgeon: Mauri Pole, MD;  Location: [facility];  Service: Orthopedics;  Laterality: Right;  with LMA  . TOTAL KNEE ARTHROPLASTY Left 11/10/2018   Procedure: TOTAL KNEE ARTHROPLASTY;  Surgeon: Paralee Cancel, MD;  Location: [facility];  Service: Orthopedics;  Laterality: Left;  70 mins  . TUBAL LIGATION      Past Medical Hx:  Past Medical History:  Diagnosis Date  . Anxiety   . Arthritis   . Back pain   . Cancer [facility])    endometrial cancer  . DDD (degenerative disc disease), lumbar   . Depression   . Gastroparesis   . History of kidney stones   . HLD (hyperlipidemia)   . Hx of sepsis 02/2014   DUE TO MULTIPLE KIDNEY STONES  . Hypertension   . Osteoporosis   . Pneumonia    2014  . UTI (urinary tract infection)     Oncology Hx:  Oncology History  Endometrial cancer ([place])  12/29/2015 Initial Diagnosis   Endometrial cancer [facility])    Genetic Testing   Patient has genetic testing done for mismatch repair protien. Results revealed patient has the following mutation(s): Abnormal: MSH6: loss of nuclear expression (less than 5% tumor expression).     Family Hx:  Family History  Problem Relation Age of Onset  . Stroke Mother   . Emphysema Mother   . COPD Mother   . Stroke Father   . Heart disease Father   . Emphysema Father      Vitals:  Blood pressure (!) 150/63, pulse 70, temperature 98.3 F (36.8 C), temperature source Oral, resp. rate 18, height 5' 2" (1.575 m), weight 205 lb (93 kg), SpO2 97 %.  Physical Exam: Well-nourished well-developed female in no acute distress.   Neck: Supple, no lymphadenopathy, no thyromegaly.  Lungs: Clear to auscultation.  Cardiac: Regular rate and rhythm.  Abdomen: Obese, soft, nontender, nondistended. No palpable masses. There is no rebound, no guarding. No fluid wave. There is no hepatosplenomegaly. Incisions are well healed.  Groins: No lymphadenopathy  Extremities: No edema  Pelvic: Normal female genitalia. The vagina is atrophic. Vaginal cuff normal. No blood or discharge. Cuff in tact.   Thereasa Solo, MD 01/22/2019, 4:35 PM

## 2019-01-22 NOTE — Patient Instructions (Signed)
Dr Denman George has ordered a testosterone blood test given your facial hair development.  Please notify Dr Denman George at phone number [phone] if you notice vaginal bleeding, new pelvic or abdominal pains, bloating, feeling full easy, or a change in bladder or bowel function.   Please contact Dr Serita Grit office (at [phone]) in June, 2021 to request an appointment with her for September, 2021.  Please visit Dr Earl Many office in 6 months for an annual visit.

## 2019-01-26 ENCOUNTER — Inpatient Hospital Stay: Payer: Medicare Other

## 2019-01-26 ENCOUNTER — Other Ambulatory Visit: Payer: Self-pay

## 2019-01-26 ENCOUNTER — Other Ambulatory Visit: Payer: Medicare Other

## 2019-01-26 DIAGNOSIS — Z90722 Acquired absence of ovaries, bilateral: Secondary | ICD-10-CM | POA: Diagnosis not present

## 2019-01-26 DIAGNOSIS — L680 Hirsutism: Secondary | ICD-10-CM

## 2019-01-26 DIAGNOSIS — F419 Anxiety disorder, unspecified: Secondary | ICD-10-CM | POA: Diagnosis not present

## 2019-01-26 DIAGNOSIS — Z90710 Acquired absence of both cervix and uterus: Secondary | ICD-10-CM | POA: Diagnosis not present

## 2019-01-26 DIAGNOSIS — C541 Malignant neoplasm of endometrium: Secondary | ICD-10-CM | POA: Diagnosis not present

## 2019-01-26 DIAGNOSIS — Z79899 Other long term (current) drug therapy: Secondary | ICD-10-CM | POA: Diagnosis not present

## 2019-01-28 ENCOUNTER — Other Ambulatory Visit: Payer: Self-pay | Admitting: Internal Medicine

## 2019-01-28 DIAGNOSIS — D352 Benign neoplasm of pituitary gland: Secondary | ICD-10-CM

## 2019-01-28 LAB — TESTOSTERONE, FREE, TOTAL, SHBG
Sex Hormone Binding: 30.5 nmol/L (ref 17.3–125.0)
Testosterone, Free: 2.7 pg/mL (ref 0.0–4.2)
Testosterone: <3 ng/dL — ABNORMAL LOW (ref 3–41)

## 2019-02-01 ENCOUNTER — Other Ambulatory Visit: Payer: Self-pay | Admitting: Radiation Therapy

## 2019-02-01 DIAGNOSIS — H04123 Dry eye syndrome of bilateral lacrimal glands: Secondary | ICD-10-CM | POA: Diagnosis not present

## 2019-02-01 DIAGNOSIS — H25813 Combined forms of age-related cataract, bilateral: Secondary | ICD-10-CM | POA: Diagnosis not present

## 2019-02-03 DIAGNOSIS — M961 Postlaminectomy syndrome, not elsewhere classified: Secondary | ICD-10-CM | POA: Diagnosis not present

## 2019-02-03 DIAGNOSIS — Z79891 Long term (current) use of opiate analgesic: Secondary | ICD-10-CM | POA: Diagnosis not present

## 2019-02-08 ENCOUNTER — Telehealth: Payer: Self-pay | Admitting: *Deleted

## 2019-02-08 NOTE — Telephone Encounter (Signed)
Patient called and wanted to discuss her lab results with someone; wanted to know what the results meant. Message forwarded to [facility] APP

## 2019-02-08 NOTE — Telephone Encounter (Signed)
Told Kimberly Larsen that since she had a pituitary tumor in the past and increase in hirsutism Dr. Denman George was checking her testosterone level to see if it were elevated to make sure that the tumor was not progressing. Pt verbalized understanding.

## 2019-02-17 DIAGNOSIS — C44719 Basal cell carcinoma of skin of left lower limb, including hip: Secondary | ICD-10-CM | POA: Diagnosis not present

## 2019-02-19 DIAGNOSIS — E2839 Other primary ovarian failure: Secondary | ICD-10-CM | POA: Diagnosis not present

## 2019-02-19 DIAGNOSIS — M85851 Other specified disorders of bone density and structure, right thigh: Secondary | ICD-10-CM | POA: Diagnosis not present

## 2019-02-19 DIAGNOSIS — Z1231 Encounter for screening mammogram for malignant neoplasm of breast: Secondary | ICD-10-CM | POA: Diagnosis not present

## 2019-02-25 DIAGNOSIS — M961 Postlaminectomy syndrome, not elsewhere classified: Secondary | ICD-10-CM | POA: Diagnosis not present

## 2019-02-25 DIAGNOSIS — M5136 Other intervertebral disc degeneration, lumbar region: Secondary | ICD-10-CM | POA: Diagnosis not present

## 2019-03-05 DIAGNOSIS — Z01419 Encounter for gynecological examination (general) (routine) without abnormal findings: Secondary | ICD-10-CM | POA: Diagnosis not present

## 2019-03-05 DIAGNOSIS — Z124 Encounter for screening for malignant neoplasm of cervix: Secondary | ICD-10-CM | POA: Diagnosis not present

## 2019-03-12 DIAGNOSIS — Z20828 Contact with and (suspected) exposure to other viral communicable diseases: Secondary | ICD-10-CM | POA: Diagnosis not present

## 2019-03-15 IMAGING — CT CT ANGIO CHEST
2 of 7 series · 18 of 46 positions shown · IV contrast (APPLIED)
Comparison: Chest CT 05/16/2017

CLINICAL DATA: Chest pain. Pulmonary thromboembolism suspected.
Intermediate probability. Positive D-dimer.

EXAM:
CT ANGIOGRAPHY CHEST WITH CONTRAST
TECHNIQUE: Multidetector CT imaging of the chest was performed using the
standard protocol during bolus administration of intravenous
contrast. Multiplanar CT image reconstructions and MIPs were
obtained to evaluate the vascular anatomy.
CONTRAST:  100mL P4RBSI-419 IOPAMIDOL (P4RBSI-419) INJECTION 76%

[Series 7: thins · axial · 0.71mm/px · z∈[+1122,+1369]mm · 15 of 400 slices shown]
[im 23/400  lung]
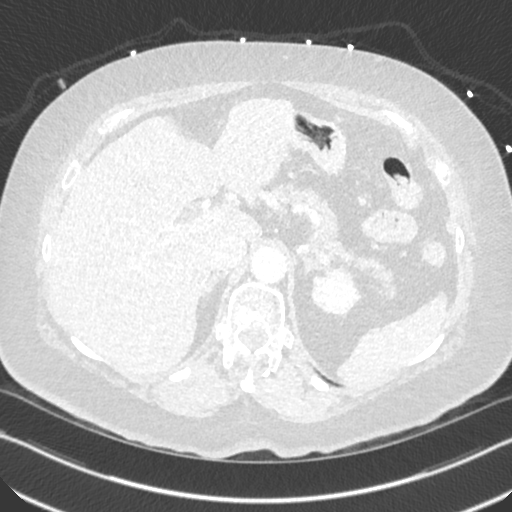
[im 45/400  soft-tissue]
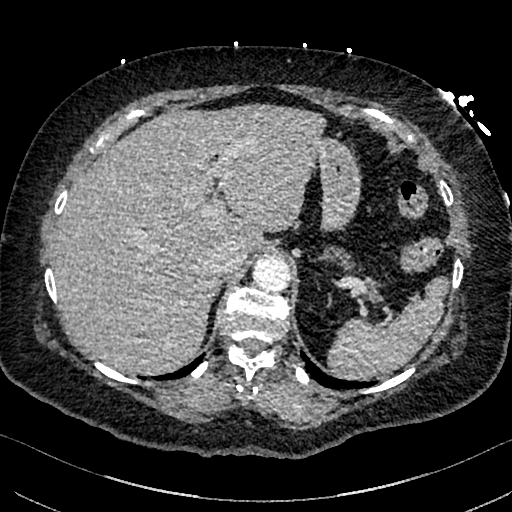
[im 67/400  lung]
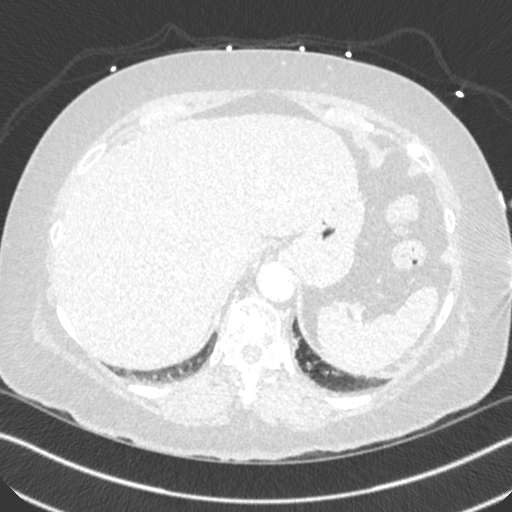
[im 89/400  soft-tissue]
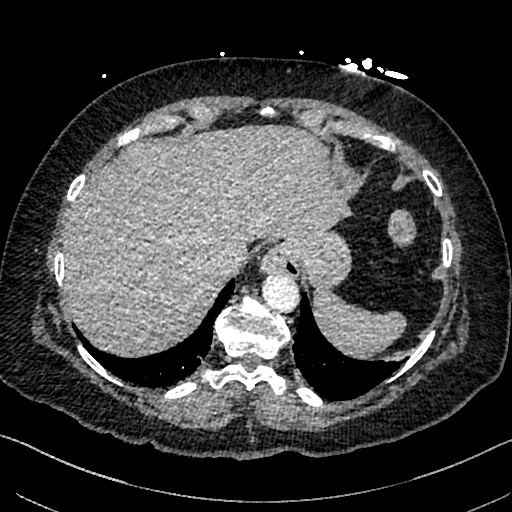
[im 134/400  lung]
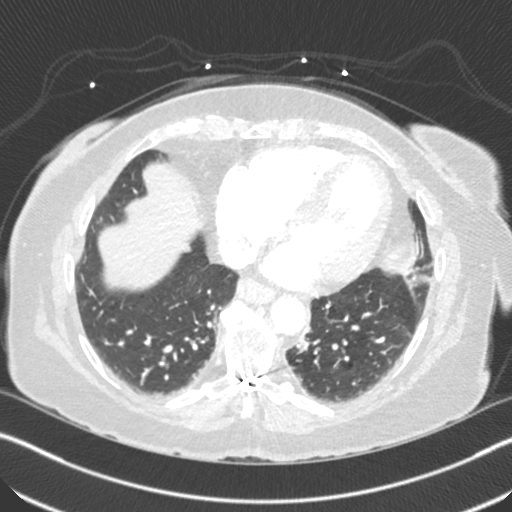
[im 156/400  soft-tissue]
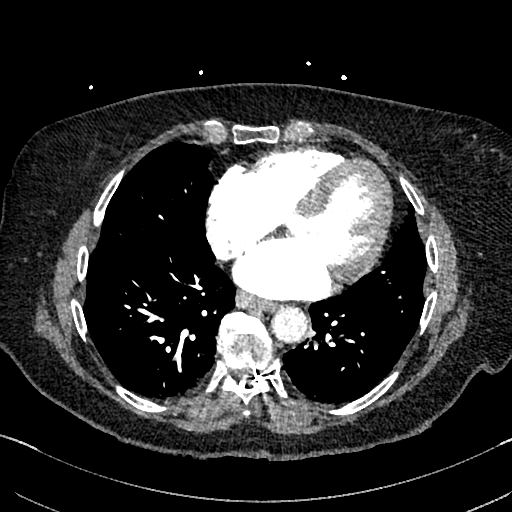
[im 178/400  lung]
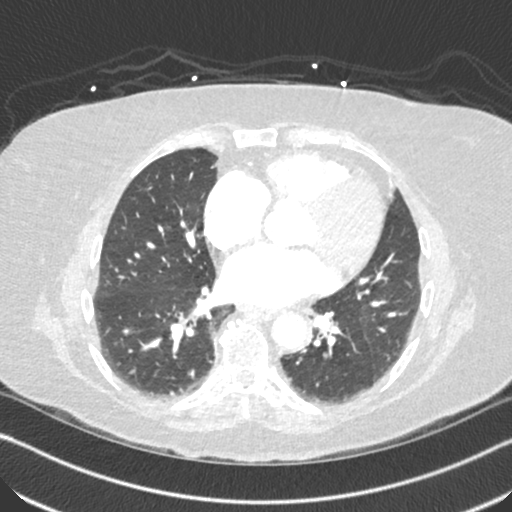
[im 200/400  soft-tissue]
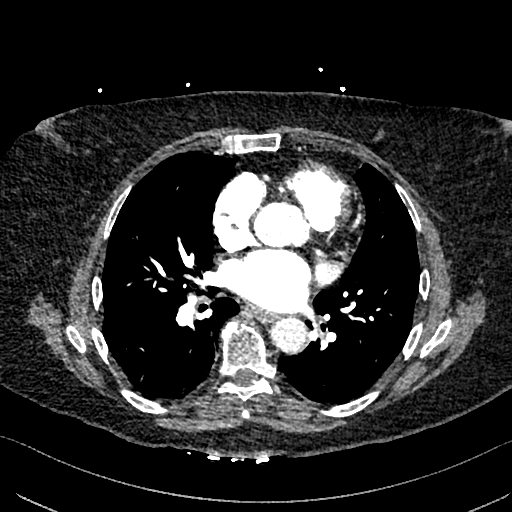
[im 222/400  lung]
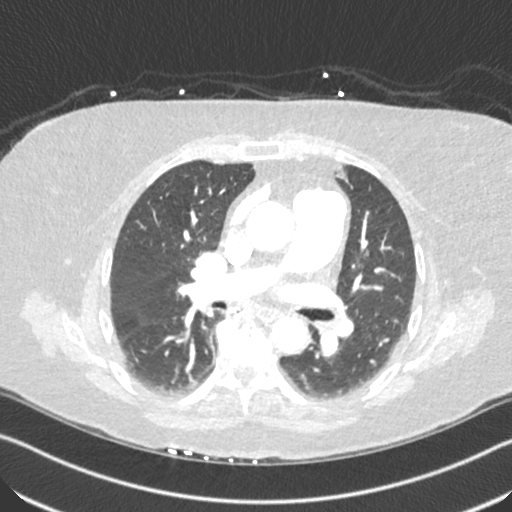
[im 244/400  soft-tissue]
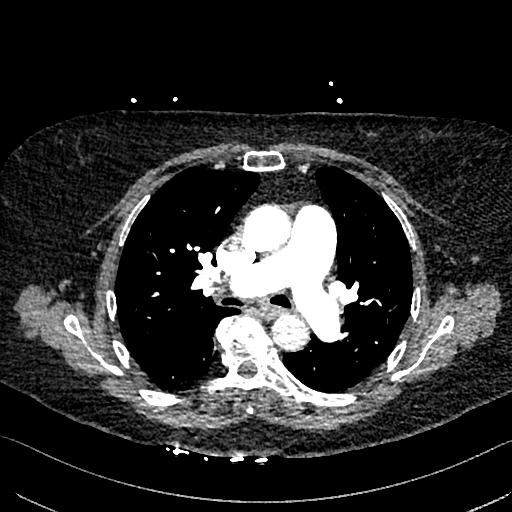
[im 267/400  lung]
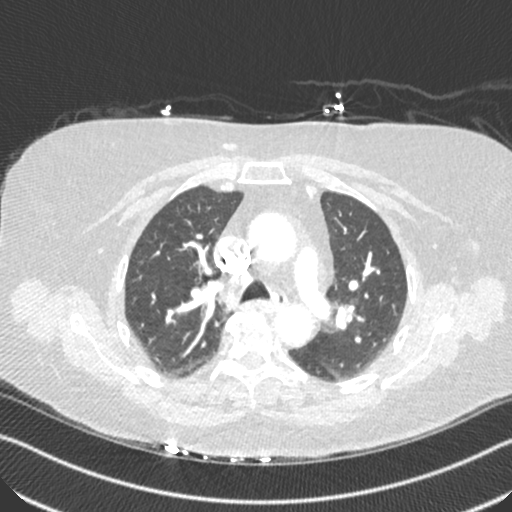
[im 311/400  soft-tissue]
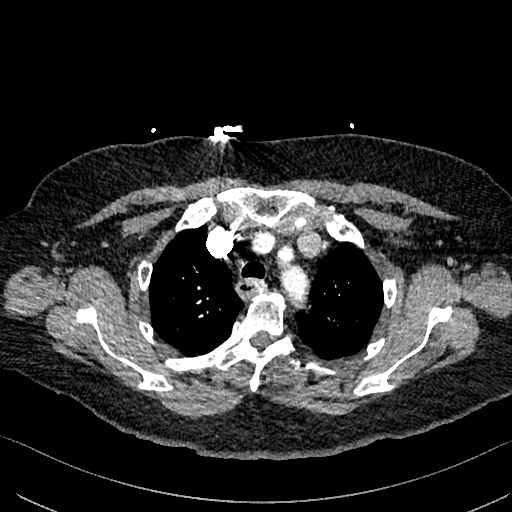
[im 333/400  lung]
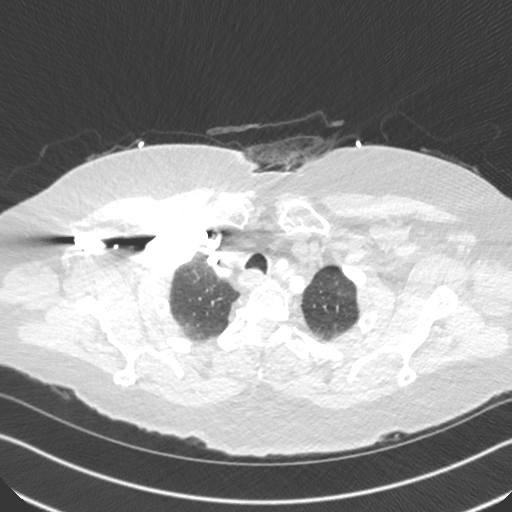
[im 355/400  soft-tissue]
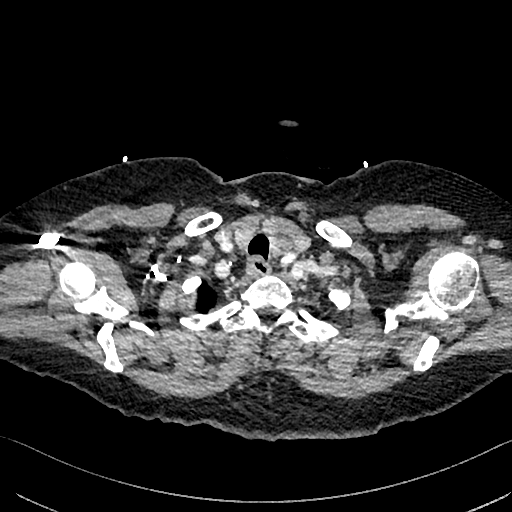
[im 377/400  lung]
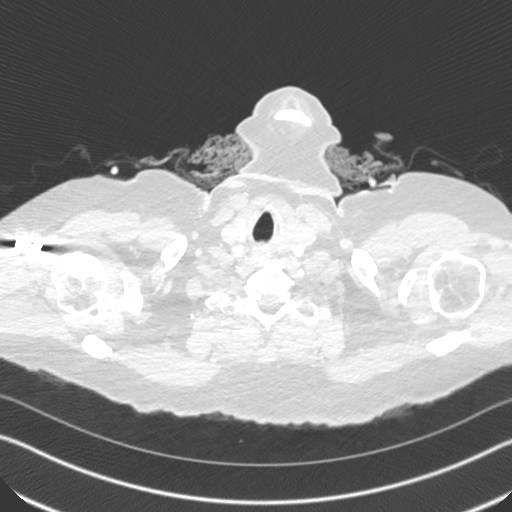

[Series 8: cor · coronal · 0.59mm/px · 3 of 151 slices shown]
[im 38/151  soft-tissue]
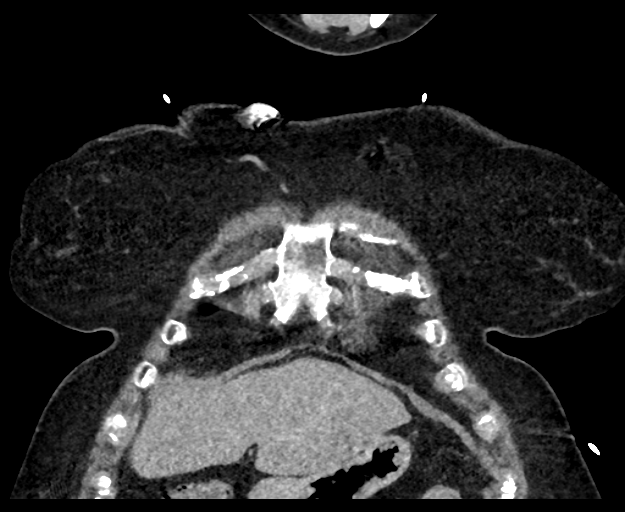
[im 76/151  soft-tissue]
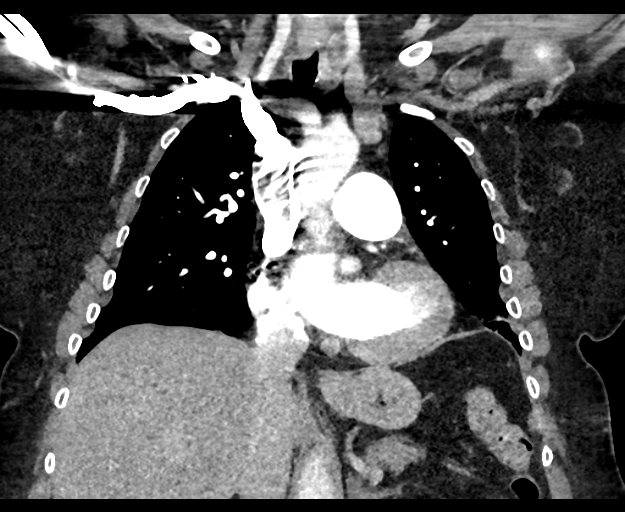
[im 113/151  soft-tissue]
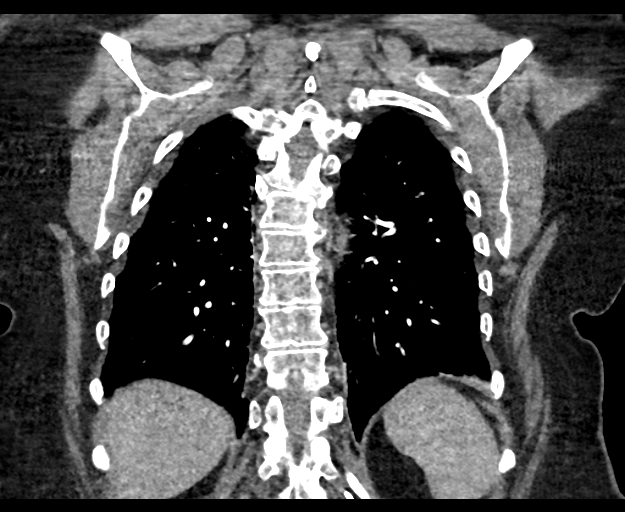

[18 of 46 positions shown; findings below may reference images not displayed]

FINDINGS: Cardiovascular: No filling defects within the pulmonary arteries to
suggest acute pulmonary embolism. No acute findings of the aorta or
great vessels. No pericardial fluid.

Mediastinum/Nodes: No axillary supraclavicular adenopathy. No
mediastinal hilar adenopathy. No pericardial effusion.

Esophagus normal. Small esophageal diverticulum just above the GE
junction measures 12 mm (image 108/5).

Lungs/Pleura: No pulmonary infarction. No pulmonary edema. No
infiltrate. No bronchiectasis. Mild peribronchial thickening in the
left lower lobe.

Upper Abdomen: Limited view of the liver, kidneys, pancreas are
unremarkable. Normal adrenal glands.

Musculoskeletal: Degenerative osteophytosis of the spine. Spinal
stimulator device in the thoracic spine central canal

Review of the MIP images confirms the above findings.
IMPRESSION: 1. No evidence acute pulmonary embolism.
2. Mild bronchial thickening in the lower lobes suggests mild
bronchitis.
3. Small esophageal diverticulum.

## 2019-03-22 ENCOUNTER — Telehealth: Payer: Self-pay | Admitting: Internal Medicine

## 2019-03-22 ENCOUNTER — Other Ambulatory Visit: Payer: Self-pay | Admitting: *Deleted

## 2019-03-22 DIAGNOSIS — Z79899 Other long term (current) drug therapy: Secondary | ICD-10-CM | POA: Diagnosis not present

## 2019-03-22 DIAGNOSIS — Z6838 Body mass index (BMI) 38.0-38.9, adult: Secondary | ICD-10-CM | POA: Diagnosis not present

## 2019-03-22 DIAGNOSIS — K219 Gastro-esophageal reflux disease without esophagitis: Secondary | ICD-10-CM | POA: Diagnosis not present

## 2019-03-22 DIAGNOSIS — I700 Atherosclerosis of aorta: Secondary | ICD-10-CM | POA: Diagnosis not present

## 2019-03-22 DIAGNOSIS — E039 Hypothyroidism, unspecified: Secondary | ICD-10-CM | POA: Diagnosis not present

## 2019-03-22 DIAGNOSIS — I10 Essential (primary) hypertension: Secondary | ICD-10-CM | POA: Diagnosis not present

## 2019-03-22 DIAGNOSIS — E785 Hyperlipidemia, unspecified: Secondary | ICD-10-CM | POA: Diagnosis not present

## 2019-03-22 NOTE — Telephone Encounter (Signed)
Scheduled appt per 11/23 sch message - unable to reach pt . Left message with appt date and time   

## 2019-03-26 ENCOUNTER — Other Ambulatory Visit: Payer: Self-pay

## 2019-03-26 DIAGNOSIS — D352 Benign neoplasm of pituitary gland: Secondary | ICD-10-CM

## 2019-03-29 ENCOUNTER — Inpatient Hospital Stay: Payer: Medicare Other | Attending: Internal Medicine

## 2019-03-29 ENCOUNTER — Ambulatory Visit (HOSPITAL_COMMUNITY)
Admission: RE | Admit: 2019-03-29 | Discharge: 2019-03-29 | Disposition: A | Discharge status: Home / Self Care | Payer: Medicare Other | Source: Ambulatory Visit | Attending: Internal Medicine | Admitting: Internal Medicine

## 2019-03-29 ENCOUNTER — Other Ambulatory Visit: Payer: Self-pay | Admitting: Internal Medicine

## 2019-03-29 ENCOUNTER — Other Ambulatory Visit: Payer: Self-pay

## 2019-03-29 ENCOUNTER — Ambulatory Visit (HOSPITAL_COMMUNITY): Admission: RE | Admit: 2019-03-29 | Payer: Medicare Other | Source: Ambulatory Visit

## 2019-03-29 DIAGNOSIS — D352 Benign neoplasm of pituitary gland: Secondary | ICD-10-CM

## 2019-03-29 DIAGNOSIS — C541 Malignant neoplasm of endometrium: Secondary | ICD-10-CM | POA: Diagnosis not present

## 2019-03-29 LAB — CMP (CANCER CENTER ONLY)
ALT: 25 U/L (ref 0–44)
AST: 23 U/L (ref 15–41)
Albumin: 3.7 g/dL (ref 3.5–5.0)
Alkaline Phosphatase: 109 U/L (ref 38–126)
Anion gap: 9 (ref 5–15)
BUN: 16 mg/dL (ref 8–23)
CO2: 30 mmol/L (ref 22–32)
Calcium: 9.2 mg/dL (ref 8.9–10.3)
Chloride: 105 mmol/L (ref 98–111)
Creatinine: 0.86 mg/dL (ref 0.44–1.00)
GFR, Est AFR Am: >60 mL/min (ref >60)
GFR, Estimated: >60 mL/min (ref >60)
Glucose, Bld: 104 mg/dL — ABNORMAL HIGH (ref 70–99)
Potassium: 4.3 mmol/L (ref 3.5–5.1)
Sodium: 144 mmol/L (ref 135–145)
Total Bilirubin: 0.5 mg/dL (ref 0.3–1.2)
Total Protein: 6.5 g/dL (ref 6.5–8.1)

## 2019-03-29 MED ORDER — IOHEXOL 300 MG/ML  SOLN
75.0000 mL | Freq: Once | INTRAMUSCULAR | Status: AC | PRN
  Administered 2019-03-29: 75 mL via INTRAVENOUS

## 2019-03-29 MED ORDER — SODIUM CHLORIDE (PF) 0.9 % IJ SOLN
INTRAMUSCULAR | Status: AC
  Filled 2019-03-29: qty 50

## 2019-03-30 ENCOUNTER — Inpatient Hospital Stay: Payer: Medicare Other | Attending: Gynecologic Oncology | Admitting: Internal Medicine

## 2019-03-30 ENCOUNTER — Other Ambulatory Visit: Payer: Self-pay

## 2019-03-30 VITALS — BP 124/64 | HR 71 | Temp 98.3°F | Resp 20 | Ht 62.0 in | Wt 211.3 lb

## 2019-03-30 DIAGNOSIS — R5383 Other fatigue: Secondary | ICD-10-CM | POA: Diagnosis not present

## 2019-03-30 DIAGNOSIS — M545 Low back pain: Secondary | ICD-10-CM | POA: Insufficient documentation

## 2019-03-30 DIAGNOSIS — D352 Benign neoplasm of pituitary gland: Secondary | ICD-10-CM

## 2019-03-30 DIAGNOSIS — G8929 Other chronic pain: Secondary | ICD-10-CM | POA: Insufficient documentation

## 2019-03-30 NOTE — Progress Notes (Signed)
[facility] [place], [place] [address] [phone]   Interval Evaluation  Date of Service: 03/30/19 Patient Name: Kimberly Larsen Patient MRN: [id number] Patient DOB: 07/20/1945 Provider: Ventura Sellers, MD  Identifying Statement:  Kimberly Larsen is a 73 y.o. female with sellar macroadenoma   Interval History:  [name] presents today after recent CT head.  She continues to take cabergoline through Dr. Buddy Duty.  Headache symptoms have recurred recently.  Continues to take oxycodone 2-3x per day due to chronic back pain.  Has been weaning Cymbalta per PCP.  Medications: Current Outpatient Medications on File Prior to Visit  Medication Sig Dispense Refill  . ALPRAZolam (XANAX) 0.25 MG tablet Take 0.25 mg by mouth 3 (three) times daily as needed for anxiety.     . B Complex Vitamins (B COMPLEX PO) Take 1 tablet by mouth daily.    . cabergoline (DOSTINEX) 0.5 MG tablet Take 1.5 mg by mouth See admin instructions. Patient takes tablets 1.5 mg on Tuesday and friday    . Cholecalciferol (VITAMIN D3) 50 MCG (2000 UT) TABS Take 2,000 mg by mouth daily at 12 noon.    . docusate sodium (COLACE) 100 MG capsule Take 1 capsule (100 mg total) by mouth 2 (two) times daily. 28 capsule 0  . DULoxetine (CYMBALTA) 30 MG capsule Take 90 mg by mouth daily.     . ferrous sulfate (FERROUSUL) 325 (65 FE) MG tablet Take 1 tablet (325 mg total) by mouth 3 (three) times daily with meals for 14 days. 42 tablet 0  . fluticasone (FLONASE) 50 MCG/ACT nasal spray Place 1 spray into both nostrils daily.    Marland Kitchen gabapentin (NEURONTIN) 600 MG tablet Take 600 mg by mouth 3 (three) times daily.    Marland Kitchen levothyroxine (SYNTHROID, LEVOTHROID) 50 MCG tablet Take 50 mcg by mouth daily before breakfast.   5  . Magnesium 500 MG TABS Take 250 mg by mouth daily.     . Melatonin 10 MG CAPS Take 10 mg by mouth at bedtime.    . metoprolol succinate (TOPROL-XL) 25 MG 24 hr tablet Take  25 mg by mouth daily.   0  . montelukast (SINGULAIR) 10 MG tablet Take 10 mg by mouth at bedtime.   1  . Multiple Vitamin (MULTIVITAMIN WITH MINERALS) TABS Take 1 tablet by mouth daily.    . Multiple Vitamins-Minerals (HAIR SKIN AND NAILS FORMULA PO) Take 1 capsule by mouth daily.     . naloxone (NARCAN) nasal spray 4 mg/0.1 mL Place 1 spray into the nose See admin instructions. Take by nasal route every 3 minutes until patient awakes or EMS arrives.    . nitrofurantoin, macrocrystal-monohydrate, (MACROBID) 100 MG capsule Take 1 capsule (100 mg total) by mouth 2 (two) times daily. (Patient not taking: Reported on 01/22/2019) 14 capsule 0  . Omega-3 Fatty Acids (FISH OIL) 600 MG CAPS Take 1 capsule by mouth 2 (two) times daily. (Patient taking differently: Take 1,000 mg by mouth 2 (two) times daily. ) 30 capsule   . omeprazole (PRILOSEC) 20 MG capsule Take 1 capsule (20 mg total) by mouth daily. 30 capsule 0  . ondansetron (ZOFRAN) 4 MG tablet Take 1 tablet by mouth every 6 (six) hours as needed for nausea or vomiting.     . polyethylene glycol (MIRALAX / GLYCOLAX) 17 g packet Take 17 g by mouth 2 (two) times daily. 28 packet 0  . pravastatin (PRAVACHOL)  40 MG tablet Take 1 tablet (40 mg total) by mouth at bedtime. 30 tablet 0  . Propylene Glycol (SYSTANE BALANCE) 0.6 % SOLN Place 1-2 drops into both eyes 4 (four) times daily.     Marland Kitchen tiZANidine (ZANAFLEX) 2 MG tablet Take 1 tablet (2 mg total) by mouth 2 (two) times a day. 30 tablet 0  . traZODone (DESYREL) 100 MG tablet Take 100 mg by mouth at bedtime.     No current facility-administered medications on file prior to visit.     Allergies:  Allergies  Allergen Reactions  . Augmentin [Amoxicillin-Pot Clavulanate] Nausea Only  . Wellbutrin [Bupropion] Nausea Only   Past Medical History:  Past Medical History:  Diagnosis Date  . Anxiety   . Arthritis   . Back pain   . Cancer [facility])    endometrial cancer  . DDD (degenerative disc disease),  lumbar   . Depression   . Gastroparesis   . History of kidney stones   . HLD (hyperlipidemia)   . Hx of sepsis 02/2014   DUE TO MULTIPLE KIDNEY STONES  . Hypertension   . Osteoporosis   . Pneumonia    2014  . UTI (urinary tract infection)    Past Surgical History:  Past Surgical History:  Procedure Laterality Date  . BACK SURGERY  2013  . CARPAL TUNNEL RELEASE     R hand  . CYSTOSCOPY WITH URETEROSCOPY AND STENT PLACEMENT Bilateral 03/25/2014   Procedure: CYSTOSCOPY WITH URETEROSCOPY AND STENT PLACEMENT;  Surgeon: Raynelle Bring, MD;  Location: [facility];  Service: Urology;  Laterality: Bilateral;  . CYSTOSCOPY WITH URETEROSCOPY AND STENT PLACEMENT Bilateral 04/18/2014   Procedure: CYSTOSCOPY WITH URETEROSCOPY AND STENT PLACEMENT,RETROGRADE;  Surgeon: Raynelle Bring, MD;  Location: [facility];  Service: Urology;  Laterality: Bilateral;  . CYSTOSCOPY WITH URETEROSCOPY AND STENT PLACEMENT Right 05/30/2014   Procedure: CYSTOSCOPY WITH URETEROSCOPY AND STENT PLACEMENT;  Surgeon: Raynelle Bring, MD;  Location: [facility];  Service: Urology;  Laterality: Right;  . CYSTOSCOPY/RETROGRADE/URETEROSCOPY Left 05/30/2014   Procedure: LEFT RETROGRADE;  Surgeon: Raynelle Bring, MD;  Location: [facility];  Service: Urology;  Laterality: Left;  . EYE SURGERY     cataract surgery bil  . GANGLION CYST EXCISION     L ankle  . HAMMERTOE RECONSTRUCTION WITH WEIL OSTEOTOMY Left 09/05/2016   Procedure: Second Metatarsal Weil Osteotomy and Hammertoe Correction;  Surgeon: Wylene Simmer, MD;  Location: [facility];  Service: Orthopedics;  Laterality: Left;  . HOLMIUM LASER APPLICATION Bilateral [id number]   Procedure: HOLMIUM LASER APPLICATION;  Surgeon: Raynelle Bring, MD;  Location: [facility];  Service: Urology;  Laterality: Bilateral;  . JOINT REPLACEMENT     total knee  . LITHOTRIPSY    . LUMBAR FUSION  09/2011  . METATARSAL OSTEOTOMY WITH BUNIONECTOMY Left 09/05/2016   Procedure: Left First Metatarsal Scarf  Osteotomy, Modified McBride Bunion Correction;  Surgeon: Wylene Simmer, MD;  Location: [facility];  Service: Orthopedics;  Laterality: Left;  . RHINOPLASTY    . ROBOTIC ASSISTED TOTAL HYSTERECTOMY WITH BILATERAL SALPINGO OOPHERECTOMY Bilateral 01/16/2016   Procedure: XI ROBOTIC ASSISTED TOTAL HYSTERECTOMY WITH BILATERAL SALPINGO OOPHORECTOMY AND BILATERAL PELVIC LYMPH NODE DISSECTION;  Surgeon: Everitt Amber, MD;  Location: [facility];  Service: Gynecology;  Laterality: Bilateral;  . SPINAL CORD STIMULATOR INSERTION N/A 02/23/2014   Procedure: SPINAL CORD STIMULATOR PLACEMENT ;  Surgeon: Melina Schools, MD;  Location: [facility];  Service: Orthopedics;  Laterality: N/A;  . TONSILLECTOMY    . TOTAL  KNEE ARTHROPLASTY Right 08/31/2012   Procedure: RIGHT TOTAL KNEE ARTHROPLASTY;  Surgeon: Mauri Pole, MD;  Location: [facility];  Service: Orthopedics;  Laterality: Right;  with LMA  . TOTAL KNEE ARTHROPLASTY Left 11/10/2018   Procedure: TOTAL KNEE ARTHROPLASTY;  Surgeon: Paralee Cancel, MD;  Location: [facility];  Service: Orthopedics;  Laterality: Left;  70 mins  . TUBAL LIGATION     Social History:  Social History   Socioeconomic History  . Marital status: Divorced    Spouse name: Not on file  . Number of children: Not on file  . Years of education: Not on file  . Highest education level: Not on file  Occupational History  . Not on file  Social Needs  . Financial resource strain: Not on file  . Food insecurity    Worry: Not on file    Inability: Not on file  . Transportation needs    Medical: Not on file    Non-medical: Not on file  Tobacco Use  . Smoking status: Never Smoker  . Smokeless tobacco: Never Used  Substance and Sexual Activity  . Alcohol use: No  . Drug use: No  . Sexual activity: Yes  Lifestyle  . Physical activity    Days per week: Not on file    Minutes per session: Not on file  . Stress: Not on file  Relationships  . Social Herbalist on phone: Not on file     Gets together: Not on file    Attends religious service: Not on file    Active member of club or organization: Not on file    Attends meetings of clubs or organizations: Not on file    Relationship status: Not on file  . Intimate partner violence    Fear of current or ex partner: Not on file    Emotionally abused: Not on file    Physically abused: Not on file    Forced sexual activity: Not on file  Other Topics Concern  . Not on file  Social History Narrative  . Not on file   Family History:  Family History  Problem Relation Age of Onset  . Stroke Mother   . Emphysema Mother   . COPD Mother   . Stroke Father   . Heart disease Father   . Emphysema Father     Review of Systems: Constitutional: Denies fevers, chills or abnormal weight loss Eyes: Denies blurriness of vision Ears, nose, mouth, throat, and face: Denies mucositis or sore throat Respiratory: Denies cough, dyspnea or wheezes Cardiovascular: Denies palpitation, chest discomfort or lower extremity swelling Gastrointestinal:  Denies nausea, constipation, diarrhea GU: Denies dysuria or incontinence Skin: Denies abnormal skin rashes Neurological: Per HPI Musculoskeletal: Back pain, knee pain Behavioral/Psych: +anxiety  Physical Exam: Vitals:   03/30/19 1028  BP: 124/64  Pulse: 71  Resp: 20  Temp: 98.3 F (36.8 C)  SpO2: 96%   KPS: 90. General: Alert, cooperative, pleasant, in no acute distress Head: Craniotomy scar noted, dry and intact. EENT: No conjunctival injection or scleral icterus. Oral mucosa moist Lungs: Resp effort normal Cardiac: Regular rate and rhythm Abdomen: Soft, non-distended abdomen Skin: No rashes cyanosis or petechiae. Extremities: No clubbing or edema  Neurologic Exam: Mental Status: Awake, alert, attentive to examiner. Oriented to self and environment. Language is fluent with intact comprehension.  Cranial Nerves: Visual acuity is grossly normal. Visual fields are full.  Extra-ocular movements intact. No ptosis. Face is symmetric, tongue midline. Motor: Tone  and bulk are normal. Power is full in both arms and legs. Reflexes are symmetric, no pathologic reflexes present. Intact finger to nose bilaterally Sensory: Intact to light touch and temperature Gait: Normal and tandem gait is normal.   Labs: I have reviewed the data as listed    Component Value Date/Time   NA 144 03/29/2019 1040   NA 141 01/10/2016 1412   K 4.3 03/29/2019 1040   K 4.0 01/10/2016 1412   CL 105 03/29/2019 1040   CO2 30 03/29/2019 1040   CO2 30 (H) 01/10/2016 1412   GLUCOSE 104 (H) 03/29/2019 1040   GLUCOSE 102 01/10/2016 1412   BUN 16 03/29/2019 1040   BUN 19.3 01/10/2016 1412   CREATININE 0.86 03/29/2019 1040   CREATININE 0.9 01/10/2016 1412   CALCIUM 9.2 03/29/2019 1040   CALCIUM 9.8 01/10/2016 1412   PROT 6.5 03/29/2019 1040   ALBUMIN 3.7 03/29/2019 1040   AST 23 03/29/2019 1040   ALT 25 03/29/2019 1040   ALKPHOS 109 03/29/2019 1040   BILITOT 0.5 03/29/2019 1040   GFRNONAA >60 03/29/2019 1040   GFRAA >60 03/29/2019 1040   Lab Results  Component Value Date   WBC 13.4 (H) 12/04/2018   NEUTROABS 2.6 10/09/2018   HGB 12.6 12/04/2018   HCT 40.8 12/04/2018   MCV 97.4 12/04/2018   PLT 312 12/04/2018   Component     Latest Ref Rng & Units 07/12/2017  Triiodothyronine,Free,Serum     2.0 - 4.4 pg/mL 1.4 (L)  T4,Free(Direct)     0.61 - 1.12 ng/dL 0.60 (L)  FSH     mIU/mL 0.7  Somatomedin C     38 - 163 ng/mL 108  LH     mIU/mL <0.2  Prolactin     4.8 - 23.3 ng/mL 4,548.0 (H)  C206 ACTH     7.2 - 63.3 pg/mL 15.4    Imaging:  [facility] Clinician Interpretation: I have personally reviewed the CNS images as listed.  My interpretation, in the context of the patient's clinical presentation, is stable disease  Ct Head W Contrast  Result Date: 03/29/2019 CLINICAL DATA:  Follow-up pituitary macro adenoma EXAM: CT HEAD WITH CONTRAST TECHNIQUE: Contiguous axial images  were obtained from the base of the skull through the vertex with intravenous contrast. CONTRAST:  32mL OMNIPAQUE IOHEXOL 300 MG/ML  SOLN COMPARISON:  CT head 09/23/2018 FINDINGS: Brain: 8 mm colloid cyst in the anterior superior third ventricle is unchanged. No hydrocephalus. Negative for acute infarct. Patchy white matter hypodensity bilaterally consistent with chronic microvascular ischemia, mild. No acute hemorrhage Mass lesion in the left sella with depression remodeling of the floor of the sella on the left. The mass shows progressive low-density on CT consistent with necrosis. The mass measures approximately 18 x 9 mm on coronal images. There is displacement of the pituitary and infundibulum to the right. There appears to be extension into the left cavernous sinus, unchanged. No compression of the optic chiasm. No suprasellar extension. Vascular: Normal arterial and venous enhancement. Skull: No acute skull abnormality. Remodeling of the floor of the sella on the left due to tumor. Sinuses/Orbits: Visualized paranasal sinuses clear. Bilateral cataract surgery. Other: None IMPRESSION: Left-sided pituitary tumor similar in size. Progressive low-density necrosis within the tumor. The lesion is consistent with a cystic macro adenoma with extension into the left cavernous sinus. No compression of the optic chiasm. Normal pituitary displaced to the right 8 mm colloid cyst unchanged. Electronically Signed   By: Franchot Gallo M.D.  On: 03/29/2019 13:00     Assessment/Plan 1. Pituitary macroadenoma [facility])  Ms. Hench is clinically and radiographically stable today regarding her prolactinoma.  She should continue to take cabergoline via endocrinology.  For fatigue we recommended continuing planned wean of cymbalta, followed by gradual taper/withdrawal of gabapentin if tolerable.    We appreciate the opportunity to participate in the care of VELENCIA HO.   She should return again in 6 months with a  contrast enhanced CT head for evaluation.  All questions were answered. The patient knows to call the clinic with any problems, questions or concerns. No barriers to learning were detected.  The total time spent in the encounter was 25 minutes and more than 50% was on counseling and review of test results   Ventura Sellers, MD Medical Director of Neuro-Oncology [facility] at [facility] 03/30/19 10:22 AM

## 2019-03-31 ENCOUNTER — Telehealth: Payer: Self-pay | Admitting: Internal Medicine

## 2019-03-31 NOTE — Telephone Encounter (Signed)
Scheduled appt per 12/1 los.  Left a vm of the appt date and time.

## 2019-04-05 ENCOUNTER — Inpatient Hospital Stay: Payer: Medicare Other

## 2019-04-06 DIAGNOSIS — Z20828 Contact with and (suspected) exposure to other viral communicable diseases: Secondary | ICD-10-CM | POA: Diagnosis not present

## 2019-04-11 DIAGNOSIS — R0989 Other specified symptoms and signs involving the circulatory and respiratory systems: Secondary | ICD-10-CM | POA: Diagnosis not present

## 2019-04-11 DIAGNOSIS — B9689 Other specified bacterial agents as the cause of diseases classified elsewhere: Secondary | ICD-10-CM | POA: Diagnosis not present

## 2019-04-11 DIAGNOSIS — Z20828 Contact with and (suspected) exposure to other viral communicable diseases: Secondary | ICD-10-CM | POA: Diagnosis not present

## 2019-04-11 DIAGNOSIS — J329 Chronic sinusitis, unspecified: Secondary | ICD-10-CM | POA: Diagnosis not present

## 2019-05-10 DIAGNOSIS — M5136 Other intervertebral disc degeneration, lumbar region: Secondary | ICD-10-CM | POA: Insufficient documentation

## 2019-05-10 HISTORY — DX: Other intervertebral disc degeneration, lumbar region: M51.36

## 2019-06-01 DIAGNOSIS — Z20828 Contact with and (suspected) exposure to other viral communicable diseases: Secondary | ICD-10-CM | POA: Diagnosis not present

## 2019-06-16 DIAGNOSIS — G894 Chronic pain syndrome: Secondary | ICD-10-CM | POA: Diagnosis not present

## 2019-06-16 DIAGNOSIS — M961 Postlaminectomy syndrome, not elsewhere classified: Secondary | ICD-10-CM | POA: Diagnosis not present

## 2019-06-22 DIAGNOSIS — R221 Localized swelling, mass and lump, neck: Secondary | ICD-10-CM | POA: Diagnosis not present

## 2019-06-23 DIAGNOSIS — R221 Localized swelling, mass and lump, neck: Secondary | ICD-10-CM | POA: Diagnosis not present

## 2019-06-23 DIAGNOSIS — E038 Other specified hypothyroidism: Secondary | ICD-10-CM | POA: Diagnosis not present

## 2019-06-29 DIAGNOSIS — F324 Major depressive disorder, single episode, in partial remission: Secondary | ICD-10-CM | POA: Diagnosis not present

## 2019-06-29 DIAGNOSIS — E785 Hyperlipidemia, unspecified: Secondary | ICD-10-CM | POA: Diagnosis not present

## 2019-06-29 DIAGNOSIS — D352 Benign neoplasm of pituitary gland: Secondary | ICD-10-CM | POA: Diagnosis not present

## 2019-06-29 DIAGNOSIS — M170 Bilateral primary osteoarthritis of knee: Secondary | ICD-10-CM | POA: Diagnosis not present

## 2019-06-29 DIAGNOSIS — R21 Rash and other nonspecific skin eruption: Secondary | ICD-10-CM | POA: Diagnosis not present

## 2019-06-29 DIAGNOSIS — Z1331 Encounter for screening for depression: Secondary | ICD-10-CM | POA: Diagnosis not present

## 2019-06-29 DIAGNOSIS — I700 Atherosclerosis of aorta: Secondary | ICD-10-CM | POA: Diagnosis not present

## 2019-06-29 DIAGNOSIS — Z8542 Personal history of malignant neoplasm of other parts of uterus: Secondary | ICD-10-CM | POA: Diagnosis not present

## 2019-06-29 DIAGNOSIS — Z6839 Body mass index (BMI) 39.0-39.9, adult: Secondary | ICD-10-CM | POA: Diagnosis not present

## 2019-06-29 DIAGNOSIS — I10 Essential (primary) hypertension: Secondary | ICD-10-CM | POA: Diagnosis not present

## 2019-07-06 DIAGNOSIS — I959 Hypotension, unspecified: Secondary | ICD-10-CM | POA: Diagnosis not present

## 2019-07-06 DIAGNOSIS — I888 Other nonspecific lymphadenitis: Secondary | ICD-10-CM | POA: Diagnosis not present

## 2019-07-06 DIAGNOSIS — L040 Acute lymphadenitis of face, head and neck: Secondary | ICD-10-CM | POA: Diagnosis not present

## 2019-07-06 DIAGNOSIS — B9689 Other specified bacterial agents as the cause of diseases classified elsewhere: Secondary | ICD-10-CM | POA: Diagnosis not present

## 2019-07-06 DIAGNOSIS — I951 Orthostatic hypotension: Secondary | ICD-10-CM | POA: Diagnosis not present

## 2019-07-06 DIAGNOSIS — N3000 Acute cystitis without hematuria: Secondary | ICD-10-CM | POA: Diagnosis not present

## 2019-07-06 DIAGNOSIS — R918 Other nonspecific abnormal finding of lung field: Secondary | ICD-10-CM | POA: Diagnosis not present

## 2019-07-06 DIAGNOSIS — R21 Rash and other nonspecific skin eruption: Secondary | ICD-10-CM | POA: Diagnosis not present

## 2019-07-12 DIAGNOSIS — R531 Weakness: Secondary | ICD-10-CM | POA: Diagnosis not present

## 2019-07-12 DIAGNOSIS — Z87440 Personal history of urinary (tract) infections: Secondary | ICD-10-CM | POA: Diagnosis not present

## 2019-07-12 DIAGNOSIS — F324 Major depressive disorder, single episode, in partial remission: Secondary | ICD-10-CM | POA: Diagnosis not present

## 2019-07-12 DIAGNOSIS — G4700 Insomnia, unspecified: Secondary | ICD-10-CM | POA: Diagnosis not present

## 2019-07-12 DIAGNOSIS — Z6837 Body mass index (BMI) 37.0-37.9, adult: Secondary | ICD-10-CM | POA: Diagnosis not present

## 2019-07-15 ENCOUNTER — Other Ambulatory Visit: Payer: Self-pay

## 2019-07-15 ENCOUNTER — Emergency Department (HOSPITAL_COMMUNITY)
Admission: EM | Admit: 2019-07-15 | Discharge: 2019-07-16 | Disposition: A | Discharge status: Home / Self Care | Payer: Medicare Other | Attending: Emergency Medicine | Admitting: Emergency Medicine

## 2019-07-15 ENCOUNTER — Encounter (HOSPITAL_COMMUNITY): Payer: Self-pay | Admitting: Emergency Medicine

## 2019-07-15 DIAGNOSIS — R319 Hematuria, unspecified: Secondary | ICD-10-CM | POA: Diagnosis not present

## 2019-07-15 DIAGNOSIS — N390 Urinary tract infection, site not specified: Secondary | ICD-10-CM | POA: Diagnosis not present

## 2019-07-15 DIAGNOSIS — Z96653 Presence of artificial knee joint, bilateral: Secondary | ICD-10-CM | POA: Diagnosis not present

## 2019-07-15 DIAGNOSIS — R102 Pelvic and perineal pain: Secondary | ICD-10-CM | POA: Diagnosis present

## 2019-07-15 DIAGNOSIS — I10 Essential (primary) hypertension: Secondary | ICD-10-CM | POA: Insufficient documentation

## 2019-07-15 DIAGNOSIS — Z79899 Other long term (current) drug therapy: Secondary | ICD-10-CM | POA: Diagnosis not present

## 2019-07-15 DIAGNOSIS — R58 Hemorrhage, not elsewhere classified: Secondary | ICD-10-CM | POA: Diagnosis not present

## 2019-07-15 DIAGNOSIS — R1030 Lower abdominal pain, unspecified: Secondary | ICD-10-CM | POA: Diagnosis not present

## 2019-07-15 DIAGNOSIS — R1084 Generalized abdominal pain: Secondary | ICD-10-CM | POA: Diagnosis not present

## 2019-07-15 LAB — URINALYSIS, ROUTINE W REFLEX MICROSCOPIC
Bilirubin Urine: NEGATIVE
Glucose, UA: NEGATIVE mg/dL
Ketones, ur: NEGATIVE mg/dL
Nitrite: POSITIVE — AB
Protein, ur: 100 mg/dL — AB
Specific Gravity, Urine: 1.025 (ref 1.005–1.030)
WBC, UA: >50 WBC/hpf — ABNORMAL HIGH (ref 0–5)
pH: 6 (ref 5.0–8.0)

## 2019-07-15 LAB — COMPREHENSIVE METABOLIC PANEL
ALT: 22 U/L (ref 0–44)
AST: 25 U/L (ref 15–41)
Albumin: 3.6 g/dL (ref 3.5–5.0)
Alkaline Phosphatase: 93 U/L (ref 38–126)
Anion gap: 9 (ref 5–15)
BUN: 22 mg/dL (ref 8–23)
CO2: 27 mmol/L (ref 22–32)
Calcium: 9.4 mg/dL (ref 8.9–10.3)
Chloride: 104 mmol/L (ref 98–111)
Creatinine, Ser: 0.97 mg/dL (ref 0.44–1.00)
GFR calc Af Amer: >60 mL/min (ref >60)
GFR calc non Af Amer: 59 mL/min — ABNORMAL LOW (ref >60)
Glucose, Bld: 112 mg/dL — ABNORMAL HIGH (ref 70–99)
Potassium: 4.4 mmol/L (ref 3.5–5.1)
Sodium: 140 mmol/L (ref 135–145)
Total Bilirubin: 1.2 mg/dL (ref 0.3–1.2)
Total Protein: 6.3 g/dL — ABNORMAL LOW (ref 6.5–8.1)

## 2019-07-15 LAB — CBC
HCT: 44.2 % (ref 36.0–46.0)
Hemoglobin: 14.2 g/dL (ref 12.0–15.0)
MCH: 31.2 pg (ref 26.0–34.0)
MCHC: 32.1 g/dL (ref 30.0–36.0)
MCV: 97.1 fL (ref 80.0–100.0)
Platelets: 293 10*3/uL (ref 150–400)
RBC: 4.55 MIL/uL (ref 3.87–5.11)
RDW: 14.2 % (ref 11.5–15.5)
WBC: 12.7 10*3/uL — ABNORMAL HIGH (ref 4.0–10.5)
nRBC: 0 % (ref 0.0–0.2)

## 2019-07-15 LAB — LIPASE, BLOOD: Lipase: 24 U/L (ref 11–51)

## 2019-07-15 MED ORDER — LACTATED RINGERS IV BOLUS
1000.0000 mL | Freq: Once | INTRAVENOUS | Status: AC
  Administered 2019-07-16: 1000 mL via INTRAVENOUS

## 2019-07-15 MED ORDER — SODIUM CHLORIDE 0.9 % IV SOLN
1.0000 g | Freq: Once | INTRAVENOUS | Status: AC
  Administered 2019-07-16: 1 g via INTRAVENOUS
  Filled 2019-07-15: qty 10

## 2019-07-15 MED ORDER — SODIUM CHLORIDE 0.9% FLUSH: 3.0000 mL | Freq: Once | INTRAVENOUS | Status: DC

## 2019-07-15 NOTE — ED Triage Notes (Signed)
Pt here via [facility] with co abd pain rates a 4 that started this morning right below her belly button. Pt states she has new onset of urinary incontinence and has just gotten over a UTI.

## 2019-07-16 DIAGNOSIS — N390 Urinary tract infection, site not specified: Secondary | ICD-10-CM | POA: Diagnosis not present

## 2019-07-16 DIAGNOSIS — Z6837 Body mass index (BMI) 37.0-37.9, adult: Secondary | ICD-10-CM | POA: Diagnosis not present

## 2019-07-16 MED ORDER — PHENAZOPYRIDINE HCL 100 MG PO TABS
95.0000 mg | ORAL_TABLET | Freq: Once | ORAL | Status: AC
  Administered 2019-07-16: 100 mg via ORAL
  Filled 2019-07-16: qty 1

## 2019-07-16 MED ORDER — PHENAZOPYRIDINE HCL 200 MG PO TABS: 200.0000 mg | ORAL_TABLET | Freq: Three times a day (TID) | ORAL | 0 refills | Status: DC | PRN

## 2019-07-16 MED ORDER — ONDANSETRON HCL 4 MG PO TABS: 4.0000 mg | ORAL_TABLET | Freq: Three times a day (TID) | ORAL | 0 refills | Status: DC | PRN

## 2019-07-16 MED ORDER — CEPHALEXIN 500 MG PO CAPS: 500.0000 mg | ORAL_CAPSULE | Freq: Four times a day (QID) | ORAL | 0 refills | Status: DC

## 2019-07-16 NOTE — ED Provider Notes (Signed)
Emergency Department Provider Note   I have reviewed the triage vital signs and the nursing notes.   HISTORY  Chief Complaint Abdominal Pain   HPI Kimberly Larsen is a 74 y.o. female who presents to the emergency department today with suprapubic discomfort.  She states  she finished a 5-day course of Keflex approximately week ago for urinary tract infection.  She had persistent suprapubic pain, dysuria and abnormal colored urine.  She talked to her primary care office today who told her to come back again in the morning to get checked again but she came here because last time she had blood in her urine she was diagnosed with cancer she did not know how long she could wait to get this figured out.  She states that she has some incontinence but it sounds like it is urge incontinence.  She has no neurologic changes.  No fevers but has had some nausea just generalized weakness.  She states that she had another urinalysis done on Monday which was negative.  She has a history of hysterectomy secondary to the uterine cancer but still follows with Dr. Alinda Money with urology for kidney stones. Does have some chronic back pain but nothing similar to renal colic she has had in past.  No other associated or modifying symptoms.    Past Medical History:  Diagnosis Date  . Anxiety   . Arthritis   . Back pain   . Cancer [facility])    endometrial cancer  . DDD (degenerative disc disease), lumbar   . Depression   . Gastroparesis   . History of kidney stones   . HLD (hyperlipidemia)   . Hx of sepsis 02/2014   DUE TO MULTIPLE KIDNEY STONES  . Hypertension   . Osteoporosis   . Pneumonia    2014  . UTI (urinary tract infection)     Patient Active Problem List   Diagnosis Date Noted  . Obese 11/11/2018  . S/P left TKA 11/10/2018  . Genetic testing 12/17/2017  . Chronic daily headache 10/23/2017  . Pituitary macroadenoma ([place]) 07/23/2017  . UTI (urinary tract infection) 07/10/2017  . Near syncope  07/10/2017  . Lactic acidosis 07/10/2017  . Endometrial cancer ([place]) 01/10/2016  . Severe sepsis with septic shock ([place]) 03/25/2014  . AKI (acute kidney injury) ([place]) 03/25/2014  . Benign essential HTN 03/25/2014  . Obstructive uropathy 03/25/2014  . Hyperlipidemia 03/25/2014  . Acute postoperative respiratory failure ([place]) 03/25/2014  . Chronic pain 02/23/2014  . Fibromyalgia 09/08/2012  . Insomnia 09/08/2012  . Obesity (BMI 30-39.9) 09/02/2012  . Expected blood loss anemia 09/02/2012  . S/P right TKA 08/31/2012  . HYPERTENSION, BENIGN SYSTEMIC 06/26/2006  . NEPHROLITHIASIS 06/26/2006    Past Surgical History:  Procedure Laterality Date  . BACK SURGERY  2013  . CARPAL TUNNEL RELEASE     R hand  . CYSTOSCOPY WITH URETEROSCOPY AND STENT PLACEMENT Bilateral 03/25/2014   Procedure: CYSTOSCOPY WITH URETEROSCOPY AND STENT PLACEMENT;  Surgeon: Raynelle Bring, MD;  Location: [facility];  Service: Urology;  Laterality: Bilateral;  . CYSTOSCOPY WITH URETEROSCOPY AND STENT PLACEMENT Bilateral 04/18/2014   Procedure: CYSTOSCOPY WITH URETEROSCOPY AND STENT PLACEMENT,RETROGRADE;  Surgeon: Raynelle Bring, MD;  Location: [facility];  Service: Urology;  Laterality: Bilateral;  . CYSTOSCOPY WITH URETEROSCOPY AND STENT PLACEMENT Right 05/30/2014   Procedure: CYSTOSCOPY WITH URETEROSCOPY AND STENT PLACEMENT;  Surgeon: Raynelle Bring, MD;  Location: [facility];  Service: Urology;  Laterality: Right;  . CYSTOSCOPY/RETROGRADE/URETEROSCOPY Left 05/30/2014  Procedure: LEFT RETROGRADE;  Surgeon: Raynelle Bring, MD;  Location: [facility];  Service: Urology;  Laterality: Left;  . EYE SURGERY     cataract surgery bil  . GANGLION CYST EXCISION     L ankle  . HAMMERTOE RECONSTRUCTION WITH WEIL OSTEOTOMY Left 09/05/2016   Procedure: Second Metatarsal Weil Osteotomy and Hammertoe Correction;  Surgeon: Wylene Simmer, MD;  Location: [place];  Service: Orthopedics;  Laterality: Left;  . HOLMIUM LASER APPLICATION  Bilateral [id number]   Procedure: HOLMIUM LASER APPLICATION;  Surgeon: Raynelle Bring, MD;  Location: [facility];  Service: Urology;  Laterality: Bilateral;  . JOINT REPLACEMENT     total knee  . LITHOTRIPSY    . LUMBAR FUSION  09/2011  . METATARSAL OSTEOTOMY WITH BUNIONECTOMY Left 09/05/2016   Procedure: Left First Metatarsal Scarf Osteotomy, Modified McBride Bunion Correction;  Surgeon: Wylene Simmer, MD;  Location: [place];  Service: Orthopedics;  Laterality: Left;  . RHINOPLASTY    . ROBOTIC ASSISTED TOTAL HYSTERECTOMY WITH BILATERAL SALPINGO OOPHERECTOMY Bilateral 01/16/2016   Procedure: XI ROBOTIC ASSISTED TOTAL HYSTERECTOMY WITH BILATERAL SALPINGO OOPHORECTOMY AND BILATERAL PELVIC LYMPH NODE DISSECTION;  Surgeon: Everitt Amber, MD;  Location[facility];  Service: Gynecology;  Laterality: Bilateral;  . SPINAL CORD STIMULATOR INSERTION N/A 02/23/2014   Procedure: SPINAL CORD STIMULATOR PLACEMENT ;  Surgeon: Melina Schools, MD;  Location: [place];  Service: Orthopedics;  Laterality: N/A;  . TONSILLECTOMY    . TOTAL KNEE ARTHROPLASTY Right 08/31/2012   Procedure: RIGHT TOTAL KNEE ARTHROPLASTY;  Surgeon: Mauri Pole, MD;  [facility];  Service: Orthopedics;  Laterality: Right;  with LMA  . TOTAL KNEE ARTHROPLASTY Left 11/10/2018   Procedure: TOTAL KNEE ARTHROPLASTY;  Surgeon: Paralee Cancel, MD;  Location[facility];  Service: Orthopedics;  Laterality: Left;  70 mins  . TUBAL LIGATION      Current Outpatient Rx  . Order #: [id number] Class: Historical Med  . Order #: [id number] Class: Historical Med  . Order #: [id number] Class: Historical Med  . Order #: [id number] Class: Historical Med  . Order #: [id number] Class: Normal  . Order #: [id number] Class: Historical Med  . Order #: [id number] Class: Normal  . Order #: [id number] Class: Historical Med  . Order #: [id number] Class: Historical Med  . Order #: [id number] Class: Historical Med  . Order #: [id number] Class: Historical Med  . Order #:  [id number] Class: Historical Med  . Order #: [id number] Class: Historical Med  . Order #: [id number] Class: Historical Med  . Order #: [id number] Class: OTC  . Order #: [id number] Class: Historical Med  . Order #: [id number] Class: Historical Med  . Order #: [id number] Class: OTC  . Order #: [id number] Class: Print  . Order #: [id number] Class: Historical Med  . Order #: [id number] Class: Normal  . Order #: [id number] Class: Fax  . Order #: [id number] Class: Normal  . Order #: [id number] Class: Historical Med  . Order #: [id number] Class: Normal  . Order #: [id number] Class: Normal  . Order #: [id number] Class: Print    Allergies Augmentin [amoxicillin-pot clavulanate] and Wellbutrin [bupropion]  Family History  Problem Relation Age of Onset  . Stroke Mother   . Emphysema Mother   . COPD Mother   . Stroke Father   . Heart disease Father   . Emphysema Father     Social History Social History   Tobacco Use  . Smoking status: Never Smoker  . Smokeless tobacco: Never Used  Substance Use Topics  . Alcohol use: No  . Drug use: No    Review of Systems  All other  systems negative except as documented in the HPI. All pertinent positives and negatives as reviewed in the HPI. ____________________________________________   PHYSICAL EXAM:  VITAL SIGNS: ED Triage Vitals  Enc Vitals Group     BP 07/15/19 1726 126/70     Pulse Rate 07/15/19 1726 78     Resp 07/15/19 1726 17     Temp 07/15/19 1726 98.8 F (37.1 C)     Temp Source 07/15/19 1726 Oral     SpO2 07/15/19 1726 96 %     Weight 07/15/19 1723 194 lb 3.2 oz (88.1 kg)     Height 07/15/19 1723 5\' 1"  (1.549 m)    Constitutional: Alert and oriented. Well appearing and in no acute distress. Eyes: Conjunctivae are normal. PERRL. EOMI. Head: Atraumatic. Nose: No congestion/rhinnorhea. Mouth/Throat: Mucous membranes are moist.  Oropharynx non-erythematous. Neck: No stridor.  No meningeal signs.   Cardiovascular: Normal rate, regular rhythm. Good  peripheral circulation. Grossly normal heart sounds.   Respiratory: Normal respiratory effort.  No retractions. Lungs CTAB. Gastrointestinal: Soft and mild suprapubic tenderness to palpation. No distention.  Musculoskeletal: No lower extremity tenderness nor edema. No gross deformities of extremities.  No CVA tenderness. Neurologic:  Normal speech and language. No gross focal neurologic deficits are appreciated.  Skin:  Skin is warm, dry and intact. No rash noted.   ____________________________________________   LABS (all labs ordered are listed, but only abnormal results are displayed)  Labs Reviewed  COMPREHENSIVE METABOLIC PANEL - Abnormal; Notable for the following components:      Result Value   Glucose, Bld 112 (*)    Total Protein 6.3 (*)    GFR calc non Af Amer 59 (*)    All other components within normal limits  CBC - Abnormal; Notable for the following components:   WBC 12.7 (*)    All other components within normal limits  URINALYSIS, ROUTINE W REFLEX MICROSCOPIC - Abnormal; Notable for the following components:   APPearance TURBID (*)    Hgb urine dipstick LARGE (*)    Protein, ur 100 (*)    Nitrite POSITIVE (*)    Leukocytes,Ua MODERATE (*)    WBC, UA >50 (*)    Bacteria, UA MANY (*)    All other components within normal limits  URINE CULTURE  LIPASE, BLOOD   ____________________________________________  EKG   EKG Interpretation  Date/Time:    Ventricular Rate:    PR Interval:    QRS Duration:   QT Interval:    QTC Calculation:   R Axis:     Text Interpretation:         ____________________________________________  RADIOLOGY  No results found.  ____________________________________________   PROCEDURES  Procedure(s) performed:   Procedures   ____________________________________________   INITIAL IMPRESSION / ASSESSMENT AND PLAN / ED COURSE Patient with recurrent urinary tract infection.  We will give her IV antibiotics and do a  prolonged course with urology follow-up.  Also will try symptomatic care with some Pyridium.  Fluids for dehydration.  Reevaluate for likely discharge.  Reevaluation, urinating normally. Spasms improved. Plan for d/c w/ urology follow up and pcp follow up fo urine..  Pertinent labs & imaging results that were available during my care of the patient were reviewed by me and considered in my medical decision making (see chart for details).  A medical screening exam was performed and I feel the patient has had an appropriate workup for their chief complaint at this time and likelihood of emergent condition existing  is low. They have been counseled on decision, discharge, follow up and which symptoms necessitate immediate return to the emergency department. They or their family verbally stated understanding and agreement with plan and discharged in stable condition.   ____________________________________________  FINAL CLINICAL IMPRESSION(S) / ED DIAGNOSES  Final diagnoses:  Urinary tract infection with hematuria, site unspecified     MEDICATIONS GIVEN DURING THIS VISIT:  Medications  sodium chloride flush (NS) 0.9 % injection 3 mL (has no administration in time range)  cefTRIAXone (ROCEPHIN) 1 g in sodium chloride 0.9 % 100 mL IVPB (0 g Intravenous Stopped 07/16/19 0052)  lactated ringers bolus 1,000 mL (0 mLs Intravenous Stopped 07/16/19 0213)  phenazopyridine (PYRIDIUM) tablet 100 mg (100 mg Oral Given 07/16/19 0053)     NEW OUTPATIENT MEDICATIONS STARTED DURING THIS VISIT:  Discharge Medication List as of 07/16/2019  1:50 AM    START taking these medications   Details  cephALEXin (KEFLEX) 500 MG capsule Take 1 capsule (500 mg total) by mouth 4 (four) times daily., Starting Fri 07/16/2019, Normal    phenazopyridine (PYRIDIUM) 200 MG tablet Take 1 tablet (200 mg total) by mouth 3 (three) times daily as needed for pain., Starting Fri 07/16/2019, Print        Note:  This note was prepared  with assistance of Dragon voice recognition software. Occasional wrong-word or sound-a-like substitutions may have occurred due to the inherent limitations of voice recognition software.   Lita Flynn, Corene Cornea, MD 07/16/19 [phone]

## 2019-07-19 LAB — URINE CULTURE: Culture: >=100000 — AB

## 2019-07-20 ENCOUNTER — Telehealth: Payer: Self-pay

## 2019-07-20 NOTE — Telephone Encounter (Signed)
Post ED Visit - Positive Culture Follow-up  Culture report reviewed by antimicrobial stewardship pharmacist: [place] Team []  Elenor Quinones, Pharm.D. []  Heide Guile, Pharm.D., BCPS AQ-ID []  Parks Neptune, Pharm.D., BCPS []  Alycia Rossetti, Pharm.D., BCPS []  [place], [place].D., BCPS, AAHIVP []  Legrand Como, Pharm.D., BCPS, AAHIVP []  Salome Arnt, PharmD, BCPS []  Johnnette Gourd, PharmD, BCPS []  Hughes Better, PharmD, BCPS []  Leeroy Cha, PharmD []  Laqueta Linden, PharmD, BCPS []  Albertina Parr, PharmD Pharm D [place] Team []  Leodis Sias, PharmD []  Lindell Spar, PharmD []  Royetta Asal, PharmD []  Graylin Shiver, Rph []  Rema Fendt) Glennon Mac, PharmD []  Arlyn Dunning, PharmD []  Netta Cedars, PharmD []  Dia Sitter, PharmD []  Leone Haven, PharmD []  Gretta Arab, PharmD []  Theodis Shove, PharmD []  Peggyann Juba, PharmD []  Reuel Boom, PharmD   Positive urine culture Treated with Cephalexin, organism sensitive to the same and no further patient follow-up is required at this time.  [name] 07/20/2019, 9:39 AM

## 2019-07-27 DIAGNOSIS — N200 Calculus of kidney: Secondary | ICD-10-CM | POA: Diagnosis not present

## 2019-07-27 DIAGNOSIS — N3000 Acute cystitis without hematuria: Secondary | ICD-10-CM | POA: Diagnosis not present

## 2019-07-29 DIAGNOSIS — M5136 Other intervertebral disc degeneration, lumbar region: Secondary | ICD-10-CM | POA: Diagnosis not present

## 2019-08-05 DIAGNOSIS — R531 Weakness: Secondary | ICD-10-CM | POA: Diagnosis not present

## 2019-08-05 DIAGNOSIS — N200 Calculus of kidney: Secondary | ICD-10-CM | POA: Diagnosis not present

## 2019-08-05 DIAGNOSIS — R109 Unspecified abdominal pain: Secondary | ICD-10-CM | POA: Diagnosis not present

## 2019-08-05 DIAGNOSIS — A770 Spotted fever due to Rickettsia rickettsii: Secondary | ICD-10-CM | POA: Diagnosis not present

## 2019-08-09 ENCOUNTER — Encounter (HOSPITAL_COMMUNITY): Payer: Self-pay | Admitting: Emergency Medicine

## 2019-08-09 ENCOUNTER — Other Ambulatory Visit: Payer: Self-pay

## 2019-08-09 ENCOUNTER — Emergency Department (HOSPITAL_COMMUNITY)
Admission: EM | Admit: 2019-08-09 | Discharge: 2019-08-09 | Disposition: A | Discharge status: Home / Self Care | Payer: Medicare Other | Attending: Emergency Medicine | Admitting: Emergency Medicine

## 2019-08-09 DIAGNOSIS — Z5321 Procedure and treatment not carried out due to patient leaving prior to being seen by health care provider: Secondary | ICD-10-CM | POA: Insufficient documentation

## 2019-08-09 DIAGNOSIS — A770 Spotted fever due to Rickettsia rickettsii: Secondary | ICD-10-CM | POA: Diagnosis not present

## 2019-08-09 DIAGNOSIS — R519 Headache, unspecified: Secondary | ICD-10-CM | POA: Diagnosis not present

## 2019-08-09 NOTE — ED Notes (Signed)
Pt turned labels in to registration and left 

## 2019-08-09 NOTE — ED Triage Notes (Signed)
Pt reports that had rocky [place] spotted fever since Feb and [facility] just notified her. Posterior headache x 3-4 days. On doxycycline and was told by [facility] health if not feeling any better to seek medical attention. Taking cipro for UTI.   No fevers and had spotting.

## 2019-08-11 DIAGNOSIS — A770 Spotted fever due to Rickettsia rickettsii: Secondary | ICD-10-CM | POA: Diagnosis not present

## 2019-08-17 DIAGNOSIS — R8271 Bacteriuria: Secondary | ICD-10-CM | POA: Diagnosis not present

## 2019-08-17 DIAGNOSIS — N200 Calculus of kidney: Secondary | ICD-10-CM | POA: Diagnosis not present

## 2019-08-19 ENCOUNTER — Other Ambulatory Visit: Payer: Self-pay

## 2019-08-19 ENCOUNTER — Ambulatory Visit (INDEPENDENT_AMBULATORY_CARE_PROVIDER_SITE_OTHER): Payer: Medicare Other | Admitting: Cardiology

## 2019-08-19 ENCOUNTER — Encounter: Payer: Self-pay | Admitting: Cardiology

## 2019-08-19 VITALS — BP 130/72 | HR 71 | Temp 97.5°F | Ht 61.0 in | Wt 202.4 lb

## 2019-08-19 DIAGNOSIS — I10 Essential (primary) hypertension: Secondary | ICD-10-CM

## 2019-08-19 DIAGNOSIS — E782 Mixed hyperlipidemia: Secondary | ICD-10-CM

## 2019-08-19 DIAGNOSIS — G894 Chronic pain syndrome: Secondary | ICD-10-CM

## 2019-08-19 DIAGNOSIS — R0609 Other forms of dyspnea: Secondary | ICD-10-CM | POA: Insufficient documentation

## 2019-08-19 DIAGNOSIS — R0600 Dyspnea, unspecified: Secondary | ICD-10-CM | POA: Insufficient documentation

## 2019-08-19 NOTE — Progress Notes (Signed)
Cardiology Consultation:    Date:  08/19/2019   ID:  Kimberly Larsen, Kimberly Larsen [birth year], MRN [id number]  PCP:  Ernestene Kiel, MD  Cardiologist:  Jenne Campus, MD   Referring MD: Lind Guest, NP   No chief complaint on file. I am weak and tired  History of Present Illness:    Kimberly Larsen is a 74 y.o. female who is being seen today for the evaluation of fatigue and dyspnea on exertion at the request of Lind Guest, NP.  She was referred to [place] because of fatigue and tiredness.  Over.  Of last few weeks to months she started experiencing fatigue and tiredness.  She was also recently diagnosed with [facility] spotted fever and she was given doxycycline for it.  Denies have any chest pain tightness squeezing pressure burning chest but walking will bring shortness of breath.  Sometimes when she sort of breath she describes some uneasy sensation in the chest.  Denies having any swelling of lower extremities no dizziness no passing out no palpitations.  She is trying to be active but this profound fatigue prevent her from doing it.  Past Medical History:  Diagnosis Date  . Acute postoperative respiratory failure ([facility]) 03/25/2014  . AKI (acute kidney injury) ([place]) 03/25/2014  . Anxiety   . Aortic atherosclerosis ([place])   . Arthritis   . Back pain   . Benign essential HTN 03/25/2014  . Cancer [facility])    endometrial cancer  . Chronic daily headache 10/23/2017  . Chronic low back pain 07/30/2017  . Chronic pain syndrome 02/23/2014  . DDD (degenerative disc disease), lumbar   . Degeneration of lumbar intervertebral disc 05/10/2019  . Depression   . Endometrial cancer ([place]) 01/10/2016  . Expected blood loss anemia 09/02/2012  . Fibromyalgia 09/08/2012  . Gastroparesis   . Genetic testing 12/17/2017   TumorNext [facility] + CancerNext was ordered. The CancerNext gene panel offered by [employer] includes sequencing and rearrangement analysis for the following 32 genes:   APC, ATM, BARD1,  BMPR1A, BRCA1, BRCA2, BRIP1, CDH1, CDK4, CDKN2A, CHEK2, DICER1, EPCAM, GREM1, HOXB13MLH1, MRE11A, MSH2, MSH6, MUTYH, NBN, NF1, PALB2, PMS2, POLD1, POLE, PTEN, RAD50, RAD51D, SMAD4, SMARCA4, STK11, and TP53.   Ger  . History of kidney stones   . HLD (hyperlipidemia)   . Hx of sepsis 02/2014   DUE TO MULTIPLE KIDNEY STONES  . Hyperlipemia   . Hyperlipidemia 03/25/2014  . Hypertension   . HYPERTENSION, BENIGN SYSTEMIC 06/26/2006   Qualifier: Diagnosis of  By: Herma Ard    . Insomnia 09/08/2012  . Lactic acidosis 07/10/2017  . Long-term current use of opiate analgesic 05/28/2017  . Lumbar post-laminectomy syndrome 07/30/2017  . Near syncope 07/10/2017  . NEPHROLITHIASIS 06/26/2006   Qualifier: Diagnosis of  By: Herma Ard    . Obese 11/11/2018  . Obesity (BMI 30-39.9) 09/02/2012  . Obstructive uropathy 03/25/2014  . Osteoporosis   . Pain in left knee 01/08/2018  . Pneumonia    2014  . Prolactinoma ([place]) 07/16/2017  . S/P left TKA 11/10/2018  . S/P right TKA 08/31/2012  . Severe sepsis with septic shock ([place]) 03/25/2014  . UTI (urinary tract infection)     Past Surgical History:  Procedure Laterality Date  . BACK SURGERY  2013  . CARPAL TUNNEL RELEASE     R hand  . CYSTOSCOPY WITH URETEROSCOPY AND STENT PLACEMENT Bilateral 03/25/2014   Procedure: CYSTOSCOPY WITH URETEROSCOPY AND STENT PLACEMENT;  Surgeon: Raynelle Bring, MD;  Location: [facility];  Service: Urology;  Laterality: Bilateral;  . CYSTOSCOPY WITH URETEROSCOPY AND STENT PLACEMENT Bilateral 04/18/2014   Procedure: CYSTOSCOPY WITH URETEROSCOPY AND STENT PLACEMENT,RETROGRADE;  Surgeon: Raynelle Bring, MD;  Location: [facility];  Service: Urology;  Laterality: Bilateral;  . CYSTOSCOPY WITH URETEROSCOPY AND STENT PLACEMENT Right 05/30/2014   Procedure: CYSTOSCOPY WITH URETEROSCOPY AND STENT PLACEMENT;  Surgeon: Raynelle Bring, MD;  Location[facility];  Service: Urology;  Laterality: Right;  . CYSTOSCOPY/RETROGRADE/URETEROSCOPY Left  05/30/2014   Procedure: LEFT RETROGRADE;  Surgeon: Raynelle Bring, MD;  Location[facility];  Service: Urology;  Laterality: Left;  . EYE SURGERY     cataract surgery bil  . GANGLION CYST EXCISION     L ankle  . HAMMERTOE RECONSTRUCTION WITH WEIL OSTEOTOMY Left 09/05/2016   Procedure: Second Metatarsal Weil Osteotomy and Hammertoe Correction;  Surgeon: Wylene Simmer, MD;  Location: [place];  Service: Orthopedics;  Laterality: Left;  . HOLMIUM LASER APPLICATION Bilateral 16/01/9603   Procedure: HOLMIUM LASER APPLICATION;  Surgeon: Raynelle Bring, MD;  [facility];  Service: Urology;  Laterality: Bilateral;  . JOINT REPLACEMENT     total knee  . LITHOTRIPSY    . LUMBAR FUSION  09/2011  . METATARSAL OSTEOTOMY WITH BUNIONECTOMY Left 09/05/2016   Procedure: Left First Metatarsal Scarf Osteotomy, Modified McBride Bunion Correction;  Surgeon: Wylene Simmer, MD;  Location: [place];  Service: Orthopedics;  Laterality: Left;  . RHINOPLASTY    . ROBOTIC ASSISTED TOTAL HYSTERECTOMY WITH BILATERAL SALPINGO OOPHERECTOMY Bilateral 01/16/2016   Procedure: XI ROBOTIC ASSISTED TOTAL HYSTERECTOMY WITH BILATERAL SALPINGO OOPHORECTOMY AND BILATERAL PELVIC LYMPH NODE DISSECTION;  Surgeon: Everitt Amber, MD;  [facility];  Service: Gynecology;  Laterality: Bilateral;  . SPINAL CORD STIMULATOR INSERTION N/A 02/23/2014   Procedure: SPINAL CORD STIMULATOR PLACEMENT ;  Surgeon: Melina Schools, MD;  Location: [place];  Service: Orthopedics;  Laterality: N/A;  . TONSILLECTOMY    . TOTAL KNEE ARTHROPLASTY Right 08/31/2012   Procedure: RIGHT TOTAL KNEE ARTHROPLASTY;  Surgeon: Mauri Pole, MD;  [facility];  Service: Orthopedics;  Laterality: Right;  with LMA  . TOTAL KNEE ARTHROPLASTY Left 11/10/2018   Procedure: TOTAL KNEE ARTHROPLASTY;  Surgeon: Paralee Cancel, MD;  [facility];  Service: Orthopedics;  Laterality: Left;  70 mins  . TUBAL LIGATION      Current Medications: Current  Meds  Medication Sig  . ALPRAZolam (XANAX) 0.25 MG tablet Take 0.25 mg by mouth 3 (three) times daily as needed for anxiety.   Marland Kitchen aspirin 81 MG chewable tablet Chew 1 tablet by mouth daily.  . B Complex Vitamins (B COMPLEX PO) Take 1 tablet by mouth daily.  . cabergoline (DOSTINEX) 0.5 MG tablet Take 1.5 mg by mouth See admin instructions. Patient takes tablets 1.5 mg on Tuesday and friday  . cabergoline (DOSTINEX) 0.5 MG tablet Take 1-2 tablets by mouth 2 (two) times a week.  . Calcium Citrate-Vitamin D 250-200 MG-UNIT TABS Take 1 tablet by mouth in the morning and at bedtime.  . celecoxib (CELEBREX) 200 MG capsule Take 200 mg by mouth daily.  . Cholecalciferol (VITAMIN D3) 125 MCG (5000 UT) CAPS Take 1 capsule by mouth daily.  Marland Kitchen docusate sodium (COLACE) 50 MG capsule Take 50 mg by mouth daily as needed.  . DULoxetine (CYMBALTA) 30 MG capsule Take 30 mg by mouth daily.  . DULoxetine (CYMBALTA) 60 MG capsule Take 60 mg by mouth daily.  . ferrous sulfate (FERROUSUL) 325 (65  FE) MG tablet Take 1 tablet (325 mg total) by mouth 3 (three) times daily with meals for 14 days. (Patient taking differently: Take 325 mg by mouth daily with breakfast. )  . fluticasone (FLONASE) 50 MCG/ACT nasal spray Place 1 spray into both nostrils daily.  Marland Kitchen gabapentin (NEURONTIN) 600 MG tablet Take 600 mg by mouth 2 (two) times daily.  . Magnesium 500 MG TABS Take 250 mg by mouth daily.   . Melatonin 10 MG CAPS Take 10 mg by mouth at bedtime.  . metoprolol succinate (TOPROL-XL) 25 MG 24 hr tablet Take 25 mg by mouth daily.   . montelukast (SINGULAIR) 10 MG tablet Take 10 mg by mouth at bedtime.   . Multiple Vitamin (MULTIVITAMIN WITH MINERALS) TABS Take 1 tablet by mouth daily.  . Multiple Vitamins-Minerals (HAIR SKIN AND NAILS FORMULA PO) Take 1 capsule by mouth daily.   . naloxone (NARCAN) nasal spray 4 mg/0.1 mL Place 1 spray into the nose See admin instructions. Take by nasal route every 3 minutes until patient awakes  or EMS arrives.  . Omega-3 Fatty Acids (FISH OIL) 1000 MG CAPS Take 1 capsule by mouth daily.  Marland Kitchen omeprazole (PRILOSEC) 20 MG capsule Take 1 capsule (20 mg total) by mouth daily.  . ondansetron (ZOFRAN) 4 MG tablet Take 1 tablet (4 mg total) by mouth every 8 (eight) hours as needed for nausea or vomiting.  Marland Kitchen oxyCODONE-acetaminophen (PERCOCET) 10-325 MG tablet Take 1 tablet by mouth 3 (three) times daily as needed for pain.   . polyethylene glycol (MIRALAX / GLYCOLAX) 17 g packet Take 17 g by mouth 2 (two) times daily.  . pravastatin (PRAVACHOL) 40 MG tablet Take 1 tablet (40 mg total) by mouth at bedtime.  Marland Kitchen tiZANidine (ZANAFLEX) 2 MG tablet Take 1 tablet by mouth 3 (three) times daily as needed.  . traZODone (DESYREL) 100 MG tablet Take 150 mg by mouth at bedtime.   . [DISCONTINUED] Multiple Vitamin (MULTIVITAMIN) tablet Take 1 tablet by mouth daily.  . [DISCONTINUED] omeprazole (PRILOSEC) 20 MG capsule Take 20 mg by mouth daily.     Allergies:   Augmentin [amoxicillin-pot clavulanate] and Wellbutrin [bupropion]   Social History   Socioeconomic History  . Marital status: Divorced    Spouse name: Not on file  . Number of children: Not on file  . Years of education: Not on file  . Highest education level: Not on file  Occupational History  . Not on file  Tobacco Use  . Smoking status: Never Smoker  . Smokeless tobacco: Never Used  Substance and Sexual Activity  . Alcohol use: No  . Drug use: No  . Sexual activity: Yes  Other Topics Concern  . Not on file  Social History Narrative  . Not on file   Social Determinants of Health   Financial Resource Strain:   . Difficulty of Paying Living Expenses:   Food Insecurity:   . Worried About Charity fundraiser in the Last Year:   . Arboriculturist in the Last Year:   Transportation Needs:   . Film/video editor (Medical):   Marland Kitchen Lack of Transportation (Non-Medical):   Physical Activity:   . Days of Exercise per Week:   .  Minutes of Exercise per Session:   Stress:   . Feeling of Stress :   Social Connections:   . Frequency of Communication with Friends and Family:   . Frequency of Social Gatherings with Friends and Family:   .  Attends Religious Services:   . Active Member of Clubs or Organizations:   . Attends Archivist Meetings:   Marland Kitchen Marital Status:      Family History: The patient's family history includes COPD in her mother; Emphysema in her father and mother; Heart disease in her father; Stroke in her father and mother. ROS:   Please see the history of present illness.    All 14 point review of systems negative except as described per history of present illness.  EKGs/Labs/Other Studies Reviewed:    The following studies were reviewed today:   EKG:  EKG is  ordered today.  The ekg ordered today demonstrates EKG showed normal sinus rhythm, septal infarct, nonspecific ST segment changes  Recent Labs: 10/09/2018: B Natriuretic Peptide 41.2; TSH 0.675 07/15/2019: ALT 22; BUN 22; Creatinine, Ser 0.97; Hemoglobin 14.2; Platelets 293; Potassium 4.4; Sodium 140  Recent Lipid Panel No results found for: CHOL, TRIG, HDL, CHOLHDL, VLDL, LDLCALC, LDLDIRECT  Physical Exam:    VS:  BP 130/72   Pulse 71   Temp (!) 97.5 F (36.4 C)   Ht 5' 1"  (1.549 m)   Wt 202 lb 6.4 oz (91.8 kg)   SpO2 96%   BMI 38.24 kg/m     Wt Readings from Last 3 Encounters:  08/19/19 202 lb 6.4 oz (91.8 kg)  07/15/19 194 lb 3.2 oz (88.1 kg)  03/30/19 211 lb 4.8 oz (95.8 kg)     GEN:  Well nourished, well developed in no acute distress HEENT: Normal NECK: No JVD; No carotid bruits LYMPHATICS: No lymphadenopathy CARDIAC: RRR, no murmurs, no rubs, no gallops RESPIRATORY:  Clear to auscultation without rales, wheezing or rhonchi  ABDOMEN: Soft, non-tender, non-distended MUSCULOSKELETAL:  No edema; No deformity  SKIN: Warm and d an HDL of 36 this is from 21st ry NEUROLOGIC:  Alert and oriented x 3 PSYCHIATRIC:   Normal affect   ASSESSMENT:    1. Dyspnea on exertion   2. HYPERTENSION, BENIGN SYSTEMIC   3. Mixed hyperlipidemia   4. Chronic pain syndrome    PLAN:    In order of problems listed above:  1. Dyspnea on exertion.  Her EKG on top of that showed possibility of anterior septal wall MI.  Obviously this is very concerning I will ask her to have an echocardiogram to assess left ventricle ejection fraction.  As a part of evaluation I will also ask you to have Lexiscan to rule out any residual coronary artery disease.  She is taking aspirin which I will continue.  Since she denies having any typical chest pain tightness squeezing pressure burning chest I will not alter any of her medications. 2. Dyslipidemia I did review her K PN which shows me LDL of 59.  This is from 21 August of 2020. 3. Chronic pain syndrome.  Noted. 4. Essential hypertension blood pressure appears to be well controlled.   We will see her I will talk more about risk factors modifications.   Medication Adjustments/Labs and Tests Ordered: Current medicines are reviewed at length with the patient today.  Concerns regarding medicines are outlined above.  Orders Placed This Encounter  Procedures  . Myocardial Perfusion Imaging  . EKG 12-Lead  . ECHOCARDIOGRAM COMPLETE   No orders of the defined types were placed in this encounter.   Signed, Park Liter, MD, [facility]. 08/19/2019 11:59 AM    [place]

## 2019-08-19 NOTE — Patient Instructions (Signed)
Medication Instructions:  Your physician recommends that you continue on your current medications as directed. Please refer to the Current Medication list given to you today.  *If you need a refill on your cardiac medications before your next appointment, please call your pharmacy*   Lab Work: None If you have labs (blood work) drawn today and your tests are completely normal, you will receive your results only by: [place] (if you have MyChart) OR A paper copy in the mail If you have any lab test that is abnormal or we need to change your treatment, we will call you to review the results.   Testing/Procedures: Your physician has requested that you have an echocardiogram. Echocardiography is a painless test that uses sound waves to create images of your heart. It provides your doctor with information about the size and shape of your heart and how well your heart's chambers and valves are working. This procedure takes approximately one hour. There are no restrictions for this procedure.  Your physician has requested that you have a lexiscan myoview. For further information please visit [email or web address]. Please follow instruction sheet, as given.  You are scheduled for a Myocardial Perfusion Imaging Study on -------  At --------.   Please arrive 15 minutes prior to your appointment time for registration and insurance purposes.   The test will take approximately 3 to 4 hours to complete; you may bring reading material. If someone comes with you to your appointment, they will need to remain in the main lobby due to limited space in the testing area.   If you are pregnant or breastfeeding, please notify the nuclear lab prior to your appointment.   How to prepare for your Myocardial Perfusion test:   Do not eat or drink 3 hours prior to your test, except you may have water.    Do not consume products containing caffeine (regular or decaffeinated) 12 hours prior to your test (ex:  coffee, chocolate, soda, tea)   Do bring a list of your current medications with you. If not listed below, you may take your medications as normal.   Bring any held medication to your appointment, as you may be required to take it once the test is complete.   Do wear comfortable clothes (no dresses or overalls) and walking shoes. Tennis shoes are preferred. No heels or open toed shoes.  Do not wear cologne, perfume, aftershave or lotions (deodorant is allowed).   If these instructions are not followed, you test will have to be rescheduled.   Please report to [address] for your test. If you have questions or concerns about your appointment, please call the Nuclear Lab at [phone].  If you cannot keep your appointment, please provide 24 hour notification to the Nuclear lab to avoid a possible $50 charge to your account.       Follow-Up: At [facility], you and your health needs are our priority.  As part of our continuing mission to provide you with exceptional heart care, we have created designated Provider Care Teams.  These Care Teams include your primary Cardiologist (physician) and Advanced Practice Providers (APPs -  Physician Assistants and Nurse Practitioners) who all work together to provide you with the care you need, when you need it.  We recommend signing up for the patient portal called "MyChart".  Sign up information is provided on this After Visit Summary.  MyChart is used to connect with patients for Virtual Visits (Telemedicine).  Patients are  able to view lab/test results, encounter notes, upcoming appointments, etc.  Non-urgent messages can be sent to your provider as well.   To learn more about what you can do with MyChart, go to [email or web address].    Your next appointment:   6 week(s)  The format for your next appointment:   In Person  Provider:   Jenne Campus, MD   Other Instructions

## 2019-08-23 ENCOUNTER — Telehealth: Payer: Self-pay | Admitting: Cardiology

## 2019-08-23 NOTE — Telephone Encounter (Signed)
New Message    Pt is calling to let Dr Agustin Cree know that she has a spinal cord stimulator in her back. She says she cannot remove it but can turn it off.  She is wondering if this is okay for her to have her stress test done     Please advise

## 2019-08-24 ENCOUNTER — Telehealth (HOSPITAL_COMMUNITY): Payer: Self-pay | Admitting: *Deleted

## 2019-08-24 NOTE — Telephone Encounter (Signed)
I do not see any problem with this.

## 2019-08-24 NOTE — Telephone Encounter (Signed)
Left message for patient to return call.

## 2019-08-24 NOTE — Telephone Encounter (Signed)
Patient given detailed instructions per Myocardial Perfusion Study Information Sheet for the test on 08/26/19 . Patient notified to arrive 15 minutes early and that it is imperative to arrive on time for appointment to keep from having the test rescheduled.  If you need to cancel or reschedule your appointment, please call the office within 24 hours of your appointment. . Patient verbalized understanding. Kirstie Peri

## 2019-08-25 NOTE — Telephone Encounter (Signed)
Spoke to the patient and let her know that Dr. Bettina Gavia stated he did not see a problem with her having the spinal cord stimulator during her stress test. She verbalizes understanding and does not have any other issues or concerns at this time.    Encouraged patient to call back with any questions or concerns.

## 2019-08-26 ENCOUNTER — Ambulatory Visit (INDEPENDENT_AMBULATORY_CARE_PROVIDER_SITE_OTHER): Payer: Medicare Other

## 2019-08-26 ENCOUNTER — Other Ambulatory Visit: Payer: Self-pay

## 2019-08-26 DIAGNOSIS — R0600 Dyspnea, unspecified: Secondary | ICD-10-CM

## 2019-08-26 DIAGNOSIS — R0609 Other forms of dyspnea: Secondary | ICD-10-CM

## 2019-08-26 LAB — MYOCARDIAL PERFUSION IMAGING
LV dias vol: 88 mL (ref 46–106)
LV sys vol: 35 mL
Peak HR: 88 {beats}/min
Rest HR: 65 {beats}/min
SDS: 1
SRS: 1
SSS: 2
TID: 1.01

## 2019-08-26 MED ORDER — TECHNETIUM TC 99M TETROFOSMIN IV KIT
32.1000 | PACK | Freq: Once | INTRAVENOUS | Status: AC | PRN
  Administered 2019-08-26: 32.1 via INTRAVENOUS

## 2019-08-26 MED ORDER — TECHNETIUM TC 99M TETROFOSMIN IV KIT
10.2000 | PACK | Freq: Once | INTRAVENOUS | Status: AC | PRN
  Administered 2019-08-26: 10.2 via INTRAVENOUS

## 2019-08-26 MED ORDER — REGADENOSON 0.4 MG/5ML IV SOLN
0.4000 mg | Freq: Once | INTRAVENOUS | Status: AC
  Administered 2019-08-26: 0.4 mg via INTRAVENOUS

## 2019-08-30 DIAGNOSIS — Z79899 Other long term (current) drug therapy: Secondary | ICD-10-CM | POA: Diagnosis not present

## 2019-08-30 DIAGNOSIS — G894 Chronic pain syndrome: Secondary | ICD-10-CM | POA: Diagnosis not present

## 2019-08-30 DIAGNOSIS — M961 Postlaminectomy syndrome, not elsewhere classified: Secondary | ICD-10-CM | POA: Diagnosis not present

## 2019-08-30 DIAGNOSIS — Z5181 Encounter for therapeutic drug level monitoring: Secondary | ICD-10-CM | POA: Diagnosis not present

## 2019-09-01 DIAGNOSIS — M545 Low back pain: Secondary | ICD-10-CM | POA: Diagnosis not present

## 2019-09-01 DIAGNOSIS — M961 Postlaminectomy syndrome, not elsewhere classified: Secondary | ICD-10-CM | POA: Diagnosis not present

## 2019-09-02 ENCOUNTER — Telehealth: Payer: Self-pay | Admitting: Cardiology

## 2019-09-02 NOTE — Telephone Encounter (Signed)
Left message for patient to return call.

## 2019-09-02 NOTE — Telephone Encounter (Signed)
Follow up ° ° °Patient is returning your call. Please call. ° ° ° °

## 2019-09-02 NOTE — Telephone Encounter (Signed)
   Pt calling would like to speak with Dr. Wendy Poet nurse, she said she has question with her stress test results. Pt said if can cell her back after 3 pm  Please call

## 2019-09-03 NOTE — Telephone Encounter (Signed)
Patient informed of results.  

## 2019-09-06 DIAGNOSIS — M545 Low back pain: Secondary | ICD-10-CM | POA: Diagnosis not present

## 2019-09-06 DIAGNOSIS — M961 Postlaminectomy syndrome, not elsewhere classified: Secondary | ICD-10-CM | POA: Diagnosis not present

## 2019-09-08 ENCOUNTER — Encounter: Payer: Self-pay | Admitting: Emergency Medicine

## 2019-09-08 ENCOUNTER — Telehealth: Payer: Self-pay | Admitting: Cardiology

## 2019-09-08 NOTE — Telephone Encounter (Signed)
   Pt called back, she said she already spoke with [facility] and discuss her stress test result. She no longer have any question  Please advise

## 2019-09-08 NOTE — Telephone Encounter (Signed)
Left message to call back  

## 2019-09-08 NOTE — Telephone Encounter (Signed)
New Message    Pt called after hours line and is returning a call for her results     Please call back

## 2019-09-09 ENCOUNTER — Ambulatory Visit (INDEPENDENT_AMBULATORY_CARE_PROVIDER_SITE_OTHER): Payer: Medicare Other

## 2019-09-09 ENCOUNTER — Other Ambulatory Visit: Payer: Self-pay

## 2019-09-09 DIAGNOSIS — R0600 Dyspnea, unspecified: Secondary | ICD-10-CM | POA: Diagnosis not present

## 2019-09-09 DIAGNOSIS — R0609 Other forms of dyspnea: Secondary | ICD-10-CM

## 2019-09-10 DIAGNOSIS — M545 Low back pain: Secondary | ICD-10-CM | POA: Diagnosis not present

## 2019-09-10 DIAGNOSIS — M961 Postlaminectomy syndrome, not elsewhere classified: Secondary | ICD-10-CM | POA: Diagnosis not present

## 2019-09-13 ENCOUNTER — Other Ambulatory Visit: Payer: Self-pay | Admitting: *Deleted

## 2019-09-13 DIAGNOSIS — M791 Myalgia, unspecified site: Secondary | ICD-10-CM | POA: Diagnosis not present

## 2019-09-13 DIAGNOSIS — D352 Benign neoplasm of pituitary gland: Secondary | ICD-10-CM

## 2019-09-13 DIAGNOSIS — M545 Low back pain: Secondary | ICD-10-CM | POA: Diagnosis not present

## 2019-09-13 DIAGNOSIS — D692 Other nonthrombocytopenic purpura: Secondary | ICD-10-CM | POA: Diagnosis not present

## 2019-09-13 DIAGNOSIS — E7800 Pure hypercholesterolemia, unspecified: Secondary | ICD-10-CM | POA: Diagnosis not present

## 2019-09-13 DIAGNOSIS — Z6838 Body mass index (BMI) 38.0-38.9, adult: Secondary | ICD-10-CM | POA: Diagnosis not present

## 2019-09-13 DIAGNOSIS — M961 Postlaminectomy syndrome, not elsewhere classified: Secondary | ICD-10-CM | POA: Diagnosis not present

## 2019-09-13 DIAGNOSIS — F324 Major depressive disorder, single episode, in partial remission: Secondary | ICD-10-CM | POA: Diagnosis not present

## 2019-09-13 DIAGNOSIS — Z79899 Other long term (current) drug therapy: Secondary | ICD-10-CM | POA: Diagnosis not present

## 2019-09-15 DIAGNOSIS — M545 Low back pain: Secondary | ICD-10-CM | POA: Diagnosis not present

## 2019-09-15 DIAGNOSIS — M961 Postlaminectomy syndrome, not elsewhere classified: Secondary | ICD-10-CM | POA: Diagnosis not present

## 2019-09-17 ENCOUNTER — Other Ambulatory Visit: Payer: Self-pay | Admitting: *Deleted

## 2019-09-17 DIAGNOSIS — D352 Benign neoplasm of pituitary gland: Secondary | ICD-10-CM

## 2019-09-20 DIAGNOSIS — M961 Postlaminectomy syndrome, not elsewhere classified: Secondary | ICD-10-CM | POA: Diagnosis not present

## 2019-09-20 DIAGNOSIS — M545 Low back pain: Secondary | ICD-10-CM | POA: Diagnosis not present

## 2019-09-21 ENCOUNTER — Other Ambulatory Visit: Payer: Self-pay | Admitting: Radiation Therapy

## 2019-09-21 DIAGNOSIS — E870 Hyperosmolality and hypernatremia: Secondary | ICD-10-CM | POA: Diagnosis not present

## 2019-09-22 DIAGNOSIS — M961 Postlaminectomy syndrome, not elsewhere classified: Secondary | ICD-10-CM | POA: Diagnosis not present

## 2019-09-22 DIAGNOSIS — M545 Low back pain: Secondary | ICD-10-CM | POA: Diagnosis not present

## 2019-09-28 DIAGNOSIS — M545 Low back pain: Secondary | ICD-10-CM | POA: Diagnosis not present

## 2019-09-28 DIAGNOSIS — M961 Postlaminectomy syndrome, not elsewhere classified: Secondary | ICD-10-CM | POA: Diagnosis not present

## 2019-09-30 DIAGNOSIS — M545 Low back pain: Secondary | ICD-10-CM | POA: Diagnosis not present

## 2019-09-30 DIAGNOSIS — M961 Postlaminectomy syndrome, not elsewhere classified: Secondary | ICD-10-CM | POA: Diagnosis not present

## 2019-10-01 ENCOUNTER — Other Ambulatory Visit: Payer: Self-pay

## 2019-10-01 ENCOUNTER — Inpatient Hospital Stay: Payer: Medicare Other | Attending: Internal Medicine

## 2019-10-01 ENCOUNTER — Encounter (HOSPITAL_COMMUNITY): Payer: Self-pay

## 2019-10-01 ENCOUNTER — Ambulatory Visit (HOSPITAL_COMMUNITY)
Admission: RE | Admit: 2019-10-01 | Discharge: 2019-10-01 | Disposition: A | Discharge status: Home / Self Care | Payer: Medicare Other | Source: Ambulatory Visit | Attending: Internal Medicine | Admitting: Internal Medicine

## 2019-10-01 DIAGNOSIS — M545 Low back pain: Secondary | ICD-10-CM | POA: Insufficient documentation

## 2019-10-01 DIAGNOSIS — D352 Benign neoplasm of pituitary gland: Secondary | ICD-10-CM | POA: Diagnosis not present

## 2019-10-01 DIAGNOSIS — G8929 Other chronic pain: Secondary | ICD-10-CM | POA: Insufficient documentation

## 2019-10-01 LAB — CMP (CANCER CENTER ONLY)
ALT: 21 U/L (ref 0–44)
AST: 18 U/L (ref 15–41)
Albumin: 3.7 g/dL (ref 3.5–5.0)
Alkaline Phosphatase: 97 U/L (ref 38–126)
Anion gap: 10 (ref 5–15)
BUN: 13 mg/dL (ref 8–23)
CO2: 30 mmol/L (ref 22–32)
Calcium: 9.9 mg/dL (ref 8.9–10.3)
Chloride: 104 mmol/L (ref 98–111)
Creatinine: 0.91 mg/dL (ref 0.44–1.00)
GFR, Est AFR Am: >60 mL/min
GFR, Estimated: >60 mL/min
Glucose, Bld: 109 mg/dL — ABNORMAL HIGH (ref 70–99)
Potassium: 4.1 mmol/L (ref 3.5–5.1)
Sodium: 144 mmol/L (ref 135–145)
Total Bilirubin: 0.7 mg/dL (ref 0.3–1.2)
Total Protein: 6.6 g/dL (ref 6.5–8.1)

## 2019-10-01 MED ORDER — IOHEXOL 300 MG/ML  SOLN
75.0000 mL | Freq: Once | INTRAMUSCULAR | Status: AC | PRN
  Administered 2019-10-01: 75 mL via INTRAVENOUS

## 2019-10-01 MED ORDER — SODIUM CHLORIDE (PF) 0.9 % IJ SOLN
INTRAMUSCULAR | Status: AC
  Filled 2019-10-01: qty 50

## 2019-10-04 ENCOUNTER — Inpatient Hospital Stay: Payer: Medicare Other

## 2019-10-04 DIAGNOSIS — M961 Postlaminectomy syndrome, not elsewhere classified: Secondary | ICD-10-CM | POA: Diagnosis not present

## 2019-10-04 DIAGNOSIS — J301 Allergic rhinitis due to pollen: Secondary | ICD-10-CM | POA: Diagnosis not present

## 2019-10-04 DIAGNOSIS — F324 Major depressive disorder, single episode, in partial remission: Secondary | ICD-10-CM | POA: Diagnosis not present

## 2019-10-04 DIAGNOSIS — M545 Low back pain: Secondary | ICD-10-CM | POA: Diagnosis not present

## 2019-10-05 ENCOUNTER — Other Ambulatory Visit: Payer: Self-pay

## 2019-10-05 ENCOUNTER — Ambulatory Visit: Payer: Medicare Other | Admitting: Internal Medicine

## 2019-10-05 ENCOUNTER — Inpatient Hospital Stay (HOSPITAL_BASED_OUTPATIENT_CLINIC_OR_DEPARTMENT_OTHER): Payer: Medicare Other | Admitting: Internal Medicine

## 2019-10-05 VITALS — BP 162/70 | HR 65 | Temp 97.9°F | Resp 18 | Wt 197.4 lb

## 2019-10-05 DIAGNOSIS — D352 Benign neoplasm of pituitary gland: Secondary | ICD-10-CM | POA: Diagnosis not present

## 2019-10-05 MED ORDER — AMITRIPTYLINE HCL 75 MG PO TABS: 75.0000 mg | ORAL_TABLET | Freq: Every day | ORAL | 3 refills | Status: DC

## 2019-10-05 NOTE — Progress Notes (Signed)
[facility] [place],  [id number] [phone]   Interval Evaluation  Date of Service: 10/05/19 Patient Name: Kimberly Larsen Patient MRN: [id number] Patient DOB: 23-Jun-1945 Provider: Ventura Sellers, MD  Identifying Statement:  Kimberly Larsen is a 74 y.o. 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  She continues to take cabergoline through Dr. Buddy Duty.  Headache symptoms have improved.  Continues to experience chronic back pain, 1 month ago stopped all pain meds due to poor efficacy.  Does complain of pain in bottoms of feet in addition to lower back.  Also still struggling with depression symptoms.  Medications: Current Outpatient Medications on File Prior to Visit  Medication Sig Dispense Refill  . ALPRAZolam (XANAX) 0.25 MG tablet Take 0.25 mg by mouth 3 (three) times daily as needed for anxiety.     Marland Kitchen aspirin 81 MG chewable tablet Chew 1 tablet by mouth daily.    . B Complex Vitamins (B COMPLEX PO) Take 1 tablet by mouth daily.    . cabergoline (DOSTINEX) 0.5 MG tablet Take 1.5 mg by mouth See admin instructions. Patient takes tablets 1.5 mg on Tuesday and friday    . Calcium Citrate-Vitamin D 250-200 MG-UNIT TABS Take 1 tablet by mouth in the morning and at bedtime.    . celecoxib (CELEBREX) 200 MG capsule Take 200 mg by mouth daily.    . Cholecalciferol (VITAMIN D3) 125 MCG (5000 UT) CAPS Take 1 capsule by mouth daily.    Marland Kitchen docusate sodium (COLACE) 50 MG capsule Take 50 mg by mouth daily as needed.    . fluticasone (FLONASE) 50 MCG/ACT nasal spray Place 1 spray into both nostrils daily.    . Magnesium 500 MG TABS Take 250 mg by mouth daily.     . Melatonin 10 MG CAPS Take 10 mg by mouth at bedtime.    . metoprolol succinate (TOPROL-XL) 25 MG 24 hr tablet Take 25 mg by mouth daily.   0  . montelukast (SINGULAIR) 10 MG tablet Take 10 mg by mouth at bedtime.   1  .  Multiple Vitamin (MULTIVITAMIN WITH MINERALS) TABS Take 1 tablet by mouth daily.    . Multiple Vitamins-Minerals (HAIR SKIN AND NAILS FORMULA PO) Take 1 capsule by mouth daily.     . Omega-3 Fatty Acids (FISH OIL) 1000 MG CAPS Take 1 capsule by mouth daily.    Marland Kitchen omeprazole (PRILOSEC) 20 MG capsule Take 1 capsule (20 mg total) by mouth daily. 30 capsule 0  . ondansetron (ZOFRAN) 4 MG tablet Take 1 tablet (4 mg total) by mouth every 8 (eight) hours as needed for nausea or vomiting. 30 tablet 0  . polyethylene glycol (MIRALAX / GLYCOLAX) 17 g packet Take 17 g by mouth 2 (two) times daily. 28 packet 0  . pravastatin (PRAVACHOL) 40 MG tablet Take 1 tablet (40 mg total) by mouth at bedtime. 30 tablet 0  . tiZANidine (ZANAFLEX) 2 MG tablet Take 1 tablet by mouth 3 (three) times daily as needed.    . traZODone (DESYREL) 100 MG tablet Take 150 mg by mouth at bedtime.     . cabergoline (DOSTINEX) 0.5 MG tablet Take 1-2 tablets by mouth 2 (two) times a week.    . DULoxetine (CYMBALTA) 30 MG capsule Take 30 mg by mouth daily.    . DULoxetine (CYMBALTA) 60 MG capsule Take 60 mg by  mouth daily.    . ferrous sulfate (FERROUSUL) 325 (65 FE) MG tablet Take 1 tablet (325 mg total) by mouth 3 (three) times daily with meals for 14 days. (Patient taking differently: Take 325 mg by mouth daily with breakfast. ) 42 tablet 0  . gabapentin (NEURONTIN) 600 MG tablet Take 600 mg by mouth 2 (two) times daily.    . naloxone (NARCAN) nasal spray 4 mg/0.1 mL Place 1 spray into the nose See admin instructions. Take by nasal route every 3 minutes until patient awakes or EMS arrives.    Marland Kitchen oxyCODONE-acetaminophen (PERCOCET) 10-325 MG tablet Take 1 tablet by mouth 3 (three) times daily as needed for pain.      No current facility-administered medications on file prior to visit.    Allergies:  Allergies  Allergen Reactions  . Augmentin [Amoxicillin-Pot Clavulanate] Nausea Only  . Wellbutrin [Bupropion] Nausea Only   Past  Medical History:  Past Medical History:  Diagnosis Date  . Acute postoperative respiratory failure ([place]) 03/25/2014  . AKI (acute kidney injury) ([place]) 03/25/2014  . Anxiety   . Aortic atherosclerosis ([place])   . Arthritis   . Back pain   . Benign essential HTN 03/25/2014  . Cancer [facility])    endometrial cancer  . Chronic daily headache 10/23/2017  . Chronic low back pain 07/30/2017  . Chronic pain syndrome 02/23/2014  . DDD (degenerative disc disease), lumbar   . Degeneration of lumbar intervertebral disc 05/10/2019  . Depression   . Endometrial cancer ([place]) 01/10/2016  . Expected blood loss anemia 09/02/2012  . Fibromyalgia 09/08/2012  . Gastroparesis   . Genetic testing 12/17/2017   TumorNext [facility] + CancerNext was ordered. The CancerNext gene panel offered by [employer] includes sequencing and rearrangement analysis for the following 32 genes:   APC, ATM, BARD1, BMPR1A, BRCA1, BRCA2, BRIP1, CDH1, CDK4, CDKN2A, CHEK2, DICER1, EPCAM, GREM1, HOXB13MLH1, MRE11A, MSH2, MSH6, MUTYH, NBN, NF1, PALB2, PMS2, POLD1, POLE, PTEN, RAD50, RAD51D, SMAD4, SMARCA4, STK11, and TP53.   Ger  . History of kidney stones   . HLD (hyperlipidemia)   . Hx of sepsis 02/2014   DUE TO MULTIPLE KIDNEY STONES  . Hyperlipemia   . Hyperlipidemia 03/25/2014  . Hypertension   . HYPERTENSION, BENIGN SYSTEMIC 06/26/2006   Qualifier: Diagnosis of  By: Herma Ard    . Insomnia 09/08/2012  . Lactic acidosis 07/10/2017  . Long-term current use of opiate analgesic 05/28/2017  . Lumbar post-laminectomy syndrome 07/30/2017  . Near syncope 07/10/2017  . NEPHROLITHIASIS 06/26/2006   Qualifier: Diagnosis of  By: Herma Ard    . Obese 11/11/2018  . Obesity (BMI 30-39.9) 09/02/2012  . Obstructive uropathy 03/25/2014  . Osteoporosis   . Pain in left knee 01/08/2018  . Pneumonia    2014  . Prolactinoma ([place]) 07/16/2017  . S/P left TKA 11/10/2018  . S/P right TKA 08/31/2012  . Severe sepsis with septic shock ([place])  03/25/2014  . UTI (urinary tract infection)    Past Surgical History:  Past Surgical History:  Procedure Laterality Date  . BACK SURGERY  2013  . CARPAL TUNNEL RELEASE     R hand  . CYSTOSCOPY WITH URETEROSCOPY AND STENT PLACEMENT Bilateral 03/25/2014   Procedure: CYSTOSCOPY WITH URETEROSCOPY AND STENT PLACEMENT;  Surgeon: Raynelle Bring, MD;  Location: [facility];  Service: Urology;  Laterality: Bilateral;  . CYSTOSCOPY WITH URETEROSCOPY AND STENT PLACEMENT Bilateral 04/18/2014   Procedure: CYSTOSCOPY WITH URETEROSCOPY AND STENT PLACEMENT,RETROGRADE;  Surgeon: Raynelle Bring, MD;  Location:  [facility];  Service: Urology;  Laterality: Bilateral;  . CYSTOSCOPY WITH URETEROSCOPY AND STENT PLACEMENT Right 05/30/2014   Procedure: CYSTOSCOPY WITH URETEROSCOPY AND STENT PLACEMENT;  Surgeon: Raynelle Bring, MD;  Location: [facility];  Service: Urology;  Laterality: Right;  . CYSTOSCOPY/RETROGRADE/URETEROSCOPY Left 05/30/2014   Procedure: LEFT RETROGRADE;  Surgeon: Raynelle Bring, MD;  Location: [facility];  Service: Urology;  Laterality: Left;  . EYE SURGERY     cataract surgery bil  . GANGLION CYST EXCISION     L ankle  . HAMMERTOE RECONSTRUCTION WITH WEIL OSTEOTOMY Left 09/05/2016   Procedure: Second Metatarsal Weil Osteotomy and Hammertoe Correction;  Surgeon: Wylene Simmer, MD;  Location: [facility];  Service: Orthopedics;  Laterality: Left;  . HOLMIUM LASER APPLICATION Bilateral 16/94/5038   Procedure: HOLMIUM LASER APPLICATION;  Surgeon: Raynelle Bring, MD;  [facility];  Service: Urology;  Laterality: Bilateral;  . JOINT REPLACEMENT     total knee  . LITHOTRIPSY    . LUMBAR FUSION  09/2011  . METATARSAL OSTEOTOMY WITH BUNIONECTOMY Left 09/05/2016   Procedure: Left First Metatarsal Scarf Osteotomy, Modified McBride Bunion Correction;  Surgeon: Wylene Simmer, MD;  Location: [facility];  Service: Orthopedics;  Laterality: Left;  . RHINOPLASTY    . ROBOTIC ASSISTED TOTAL HYSTERECTOMY  WITH BILATERAL SALPINGO OOPHERECTOMY Bilateral 01/16/2016   Procedure: XI ROBOTIC ASSISTED TOTAL HYSTERECTOMY WITH BILATERAL SALPINGO OOPHORECTOMY AND BILATERAL PELVIC LYMPH NODE DISSECTION;  Surgeon: Everitt Amber, MD;  [facility];  Service: Gynecology;  Laterality: Bilateral;  . SPINAL CORD STIMULATOR INSERTION N/A 02/23/2014   Procedure: SPINAL CORD STIMULATOR PLACEMENT ;  Surgeon: Melina Schools, MD;  Location: [facility];  Service: Orthopedics;  Laterality: N/A;  . TONSILLECTOMY    . TOTAL KNEE ARTHROPLASTY Right 08/31/2012   Procedure: RIGHT TOTAL KNEE ARTHROPLASTY;  Surgeon: Mauri Pole, MD;  [facility];  Service: Orthopedics;  Laterality: Right;  with LMA  . TOTAL KNEE ARTHROPLASTY Left 11/10/2018   Procedure: TOTAL KNEE ARTHROPLASTY;  Surgeon: Paralee Cancel, MD;  [facility];  Service: Orthopedics;  Laterality: Left;  70 mins  . TUBAL LIGATION     Social History:  Social History   Socioeconomic History  . Marital status: Divorced    Spouse name: Not on file  . Number of children: Not on file  . Years of education: Not on file  . Highest education level: Not on file  Occupational History  . Not on file  Tobacco Use  . Smoking status: Never Smoker  . Smokeless tobacco: Never Used  Substance and Sexual Activity  . Alcohol use: No  . Drug use: No  . Sexual activity: Yes  Other Topics Concern  . Not on file  Social History Narrative  . Not on file   Social Determinants of Health   Financial Resource Strain:   . Difficulty of Paying Living Expenses:   Food Insecurity:   . Worried About Charity fundraiser in the Last Year:   . Arboriculturist in the Last Year:   Transportation Needs:   . Film/video editor (Medical):   Marland Kitchen Lack of Transportation (Non-Medical):   Physical Activity:   . Days of Exercise per Week:   . Minutes of Exercise per Session:   Stress:   . Feeling of Stress :   Social Connections:   . Frequency of Communication with Friends and  Family:   . Frequency of Social Gatherings with Friends and Family:   .  Attends Religious Services:   . Active Member of Clubs or Organizations:   . Attends Archivist Meetings:   Marland Kitchen Marital Status:   Intimate Partner Violence:   . Fear of Current or Ex-Partner:   . Emotionally Abused:   Marland Kitchen Physically Abused:   . Sexually Abused:    Family History:  Family History  Problem Relation Age of Onset  . Stroke Mother   . Emphysema Mother   . COPD Mother   . Stroke Father   . Heart disease Father   . Emphysema Father     Review of Systems: Constitutional: Denies fevers, chills or abnormal weight loss Eyes: Denies blurriness of vision Ears, nose, mouth, throat, and face: Denies mucositis or sore throat Respiratory: Denies cough, dyspnea or wheezes Cardiovascular: Denies palpitation, chest discomfort or lower extremity swelling Gastrointestinal:  Denies nausea, constipation, diarrhea GU: Denies dysuria or incontinence Skin: Denies abnormal skin rashes Neurological: Per HPI Musculoskeletal: Back pain, knee pain Behavioral/Psych: +anxiety  Physical Exam: Vitals:   10/05/19 1127  BP: (!) 162/70  Pulse: 65  Resp: 18  Temp: 97.9 F (36.6 C)  SpO2: 95%   KPS: 90. General: Alert, cooperative, pleasant, in no acute distress Head: Craniotomy scar noted, dry and intact. EENT: No conjunctival injection or scleral icterus. Oral mucosa moist Lungs: Resp effort normal Cardiac: Regular rate and rhythm Abdomen: Soft, non-distended abdomen Skin: No rashes cyanosis or petechiae. Extremities: No clubbing or edema  Neurologic Exam: Mental Status: Awake, alert, attentive to examiner. Oriented to self and environment. Language is fluent with intact comprehension.  Cranial Nerves: Visual acuity is grossly normal. Visual fields are full. Extra-ocular movements intact. No ptosis. Face is symmetric, tongue midline. Motor: Tone and bulk are normal. Power is full in both arms and legs.  Reflexes are symmetric, no pathologic reflexes present. Intact finger to nose bilaterally Sensory: Intact to light touch and temperature Gait: Normal and tandem gait is normal.   Labs: I have reviewed the data as listed    Component Value Date/Time   NA 144 10/01/2019 1132   NA 141 01/10/2016 1412   K 4.1 10/01/2019 1132   K 4.0 01/10/2016 1412   CL 104 10/01/2019 1132   CO2 30 10/01/2019 1132   CO2 30 (H) 01/10/2016 1412   GLUCOSE 109 (H) 10/01/2019 1132   GLUCOSE 102 01/10/2016 1412   BUN 13 10/01/2019 1132   BUN 19.3 01/10/2016 1412   CREATININE 0.91 10/01/2019 1132   CREATININE 0.9 01/10/2016 1412   CALCIUM 9.9 10/01/2019 1132   CALCIUM 9.8 01/10/2016 1412   PROT 6.6 10/01/2019 1132   ALBUMIN 3.7 10/01/2019 1132   AST 18 10/01/2019 1132   ALT 21 10/01/2019 1132   ALKPHOS 97 10/01/2019 1132   BILITOT 0.7 10/01/2019 1132   GFRNONAA >60 10/01/2019 1132   GFRAA >60 10/01/2019 1132   Lab Results  Component Value Date   WBC 12.7 (H) 07/15/2019   NEUTROABS 2.6 10/09/2018   HGB 14.2 07/15/2019   HCT 44.2 07/15/2019   MCV 97.1 07/15/2019   PLT 293 07/15/2019   Component     Latest Ref Rng & Units 07/12/2017  Triiodothyronine,Free,Serum     2.0 - 4.4 pg/mL 1.4 (L)  T4,Free(Direct)     0.61 - 1.12 ng/dL 0.60 (L)  FSH     mIU/mL 0.7  Somatomedin C     38 - 163 ng/mL 108  LH     mIU/mL <0.2  Prolactin     4.8 -  23.3 ng/mL 4,548.0 (H)  C206 ACTH     7.2 - 63.3 pg/mL 15.4    Imaging:  [facility] Clinician Interpretation: I have personally reviewed the CNS images as listed.  My interpretation, in the context of the patient's clinical presentation, is stable disease  CT Head W Wo Contrast  Result Date: 10/02/2019 CLINICAL DATA:  Benign pituitary tumor.  Depression.  Leg weakness. EXAM: CT HEAD WITHOUT AND WITH CONTRAST TECHNIQUE: Contiguous axial images were obtained from the base of the skull through the vertex without and with intravenous contrast CONTRAST:  21m  OMNIPAQUE IOHEXOL 300 MG/ML  SOLN COMPARISON:  09/23/2018 FINDINGS: Brain: No change since the previous study. Brain parenchyma does not show any acute finding. There is minimal small vessel change of the hemispheric white matter. 8 mm colloid cyst of the anterior third ventricle is again demonstrated without apparent change or obstructive hydrocephalus. Ventricular size is normal. No extra-axial collection. Redemonstrated is a pituitary macro adenoma with left cavernous sinus extension. Approximate measurements are unchanged at 27 x 12 x 21 mm. No suprasellar extension. Skull base remodeling is unchanged Vascular: There is atherosclerotic calcification of the major vessels at the base of the brain. Skull: Negative Sinuses/Orbits: Clear/normal Other: None IMPRESSION: No change. Pituitary tumor measuring approximately 27 x 12 x 21 mm with left cavernous sinus extension and chronic skull base remodeling. No change in an 8 mm colloid cyst of the anterior third ventricle. Electronically Signed   By: MNelson ChimesM.D.   On: 10/02/2019 08:35   ECHOCARDIOGRAM COMPLETE  Result Date: 09/09/2019    ECHOCARDIOGRAM REPORT   Patient Name:   Kimberly SELMERDate of Exam: 09/09/2019 Medical Rec #:  [id number]    Height:       61.0 in Accession #:    [id number]   Weight:       202.0 lb Date of Birth:  903/02/1946     BSA:          1.897 m Patient Age:    89years      BP:           130/72 mmHg Patient Gender: F             HR:           66 bpm. Exam Location:  [place] Procedure: 2D Echo, Cardiac Doppler and Color Doppler Indications:    Dyspnea 786.09 / R06.00  History:        Patient has prior history of Echocardiogram examinations, most                 recent 07/11/2017. Signs/Symptoms:DOE; Risk Factors:Hypertension,                 Dyslipidemia and Non-Smoker. Sepsis.  Sonographer:    JVickie EpleyRDCS Referring Phys: [id number] 1. Left ventricular ejection fraction, by estimation, is 55 to 60%. The  left ventricle has normal function. The left ventricle has no regional wall motion abnormalities. Left ventricular diastolic parameters were normal.  2. Right ventricular systolic function is normal. The right ventricular size is normal. There is normal pulmonary artery systolic pressure.  3. The mitral valve is normal in structure. Mild mitral valve regurgitation. No evidence of mitral stenosis.  4. The aortic valve is normal in structure. Aortic valve regurgitation is not visualized. No aortic stenosis is present.  5. The inferior vena cava is normal in size with greater than 50% respiratory variability,  suggesting right atrial pressure of 3 mmHg. FINDINGS  Left Ventricle: Left ventricular ejection fraction, by estimation, is 55 to 60%. The left ventricle has normal function. The left ventricle has no regional wall motion abnormalities. The left ventricular internal cavity size was normal in size. There is  no left ventricular hypertrophy. Left ventricular diastolic parameters were normal. Right Ventricle: The right ventricular size is normal. No increase in right ventricular wall thickness. Right ventricular systolic function is normal. There is normal pulmonary artery systolic pressure. The tricuspid regurgitant velocity is 2.41 m/s, and  with an assumed right atrial pressure of 3 mmHg, the estimated right ventricular systolic pressure is 00.1 mmHg. Left Atrium: Left atrial size was normal in size. Right Atrium: Right atrial size was normal in size. Pericardium: There is no evidence of pericardial effusion. Mitral Valve: The mitral valve is normal in structure. Normal mobility of the mitral valve leaflets. Mild mitral annular calcification. Mild mitral valve regurgitation. No evidence of mitral valve stenosis. Tricuspid Valve: The tricuspid valve is normal in structure. Tricuspid valve regurgitation is mild . No evidence of tricuspid stenosis. Aortic Valve: The aortic valve is normal in structure. Aortic valve  regurgitation is not visualized. No aortic stenosis is present. Pulmonic Valve: The pulmonic valve was not well visualized. Pulmonic valve regurgitation is not visualized. No evidence of pulmonic stenosis. Aorta: The aortic root is normal in size and structure. Venous: The inferior vena cava is normal in size with greater than 50% respiratory variability, suggesting right atrial pressure of 3 mmHg. IAS/Shunts: No atrial level shunt detected by color flow Doppler.  LEFT VENTRICLE PLAX 2D LVIDd:         4.58 cm      Diastology LVIDs:         3.09 cm      LV e' lateral:   6.64 cm/s LV PW:         0.82 cm      LV E/e' lateral: 13.4 LV IVS:        0.82 cm      LV e' medial:    5.55 cm/s LVOT diam:     2.10 cm      LV E/e' medial:  16.1 LV SV:         79 LV SV Index:   42 LVOT Area:     3.46 cm  LV Volumes (MOD) LV vol d, MOD A2C: 123.0 ml LV vol d, MOD A4C: 111.0 ml LV vol s, MOD A2C: 61.4 ml LV vol s, MOD A4C: 50.7 ml LV SV MOD A2C:     61.6 ml LV SV MOD A4C:     111.0 ml LV SV MOD BP:      62.6 ml RIGHT VENTRICLE RV S prime:     11.60 cm/s TAPSE (M-mode): 2.3 cm LEFT ATRIUM             Index       RIGHT ATRIUM           Index LA diam:        3.90 cm 2.06 cm/m  RA Area:     12.60 cm LA Vol (A2C):   43.6 ml 22.99 ml/m RA Volume:   26.00 ml  13.71 ml/m LA Vol (A4C):   47.3 ml 24.94 ml/m LA Biplane Vol: 45.9 ml 24.20 ml/m  AORTIC VALVE LVOT Vmax:   99.00 cm/s LVOT Vmean:  64.600 cm/s LVOT VTI:    0.229 m  AORTA Ao Root diam:  3.10 cm MITRAL VALVE                TRICUSPID VALVE MV Area (PHT): 3.31 cm     TR Peak grad:   23.2 mmHg MV Decel Time: 229 msec     TR Vmax:        241.00 cm/s MV E velocity: 89.20 cm/s MV A velocity: 124.00 cm/s  SHUNTS MV E/A ratio:  0.72         Systemic VTI:  0.23 m                             Systemic Diam: 2.10 cm Kardie Tobb DO Electronically signed by Berniece Salines DO Signature Date/Time: 09/09/2019/12:45:56 PM    Final      Assessment/Plan 1. Pituitary macroadenoma [facility])  Ms.  Rosello is clinically and radiographically stable today regarding her prolactinoma.  She should continue to take cabergoline via endocrinology.  For chronic pain, neuropathic pain, and depression symptoms we offered trial of Elavil 25m HS.  This can be further titrated by our office or by her PCP.  We appreciate the opportunity to participate in the care of ANEYRA PETTIE   She should return again in 1 year with a contrast enhanced CT head for evaluation, or sooner if needed.  All questions were answered. The patient knows to call the clinic with any problems, questions or concerns. No barriers to learning were detected.  The total time spent in the encounter was 30 minutes and more than 50% was on counseling and review of test results   ZVentura Sellers MD Medical Director of Neuro-Oncology [facility] [place]06/08/21 2:54 PM

## 2019-10-06 ENCOUNTER — Telehealth: Payer: Self-pay | Admitting: Internal Medicine

## 2019-10-06 DIAGNOSIS — M961 Postlaminectomy syndrome, not elsewhere classified: Secondary | ICD-10-CM | POA: Diagnosis not present

## 2019-10-06 DIAGNOSIS — M545 Low back pain: Secondary | ICD-10-CM | POA: Diagnosis not present

## 2019-10-06 NOTE — Telephone Encounter (Signed)
Scheduled appt per 6/8 los.  Spoke with pt and they are aware of the appt date and time.

## 2019-10-07 DIAGNOSIS — M5136 Other intervertebral disc degeneration, lumbar region: Secondary | ICD-10-CM | POA: Diagnosis not present

## 2019-10-11 ENCOUNTER — Other Ambulatory Visit: Payer: Self-pay

## 2019-10-14 DIAGNOSIS — R531 Weakness: Secondary | ICD-10-CM | POA: Diagnosis not present

## 2019-10-14 DIAGNOSIS — R0602 Shortness of breath: Secondary | ICD-10-CM | POA: Diagnosis not present

## 2019-10-14 DIAGNOSIS — N390 Urinary tract infection, site not specified: Secondary | ICD-10-CM | POA: Diagnosis not present

## 2019-10-18 ENCOUNTER — Encounter: Payer: Self-pay | Admitting: Cardiology

## 2019-10-18 ENCOUNTER — Other Ambulatory Visit: Payer: Self-pay

## 2019-10-18 ENCOUNTER — Ambulatory Visit (INDEPENDENT_AMBULATORY_CARE_PROVIDER_SITE_OTHER): Payer: Medicare Other | Admitting: Cardiology

## 2019-10-18 VITALS — BP 118/78 | HR 100 | Ht 61.0 in | Wt 195.0 lb

## 2019-10-18 DIAGNOSIS — I10 Essential (primary) hypertension: Secondary | ICD-10-CM

## 2019-10-18 DIAGNOSIS — M797 Fibromyalgia: Secondary | ICD-10-CM

## 2019-10-18 DIAGNOSIS — R0600 Dyspnea, unspecified: Secondary | ICD-10-CM

## 2019-10-18 DIAGNOSIS — R0609 Other forms of dyspnea: Secondary | ICD-10-CM

## 2019-10-18 NOTE — Patient Instructions (Signed)
Medication Instructions:  Your physician recommends that you continue on your current medications as directed. Please refer to the Current Medication list given to you today.  *If you need a refill on your cardiac medications before your next appointment, please call your pharmacy*   Lab Work: None.  If you have labs (blood work) drawn today and your tests are completely normal, you will receive your results only by: Marland Kitchen MyChart Message (if you have MyChart) OR . A paper copy in the mail If you have any lab test that is abnormal or we need to change your treatment, we will call you to review the results.   Testing/Procedures: None.    Follow-Up: At [facility], you and your health needs are our priority.  As part of our continuing mission to provide you with exceptional heart care, we have created designated Provider Care Teams.  These Care Teams include your primary Cardiologist (physician) and Advanced Practice Providers (APPs -  Physician Assistants and Nurse Practitioners) who all work together to provide you with the care you need, when you need it.  We recommend signing up for the patient portal called "MyChart".  Sign up information is provided on this After Visit Summary.  MyChart is used to connect with patients for Virtual Visits (Telemedicine).  Patients are able to view lab/test results, encounter notes, upcoming appointments, etc.  Non-urgent messages can be sent to your provider as well.   To learn more about what you can do with MyChart, go to [email or web address].    Your next appointment:   1 year(s)  The format for your next appointment:   In Person  Provider:      Other Instructions

## 2019-10-18 NOTE — Progress Notes (Signed)
Cardiology Office Note:    Date:  10/18/2019   ID:  Kimberly Larsen, DOB [birth year], MRN [id number]  PCP:  Ernestene Kiel, MD  Cardiologist:  Jenne Campus, MD    Referring MD: Ernestene Kiel, MD   Chief Complaint  Patient presents with  . Follow-up  Still complaining of being weak tired and exhausted  History of Present Illness:    Kimberly Larsen is a 74 y.o. female who was referred to [place] because of fatigue tiredness and shortness of breath.  We did quite extensive cardiac evaluation which included echocardiogram which showed preserved left ventricle ejection fraction, stress test showed no evidence of ischemia.  She is here to talk about it still complaining of tired fatigue.  Recently she was discovered to have [facility] spotted fever and date received series of antibiotics.  But still in spite of that feeling very weak tired and exhausted.  Past Medical History:  Diagnosis Date  . Acute postoperative respiratory failure ([place]) 03/25/2014  . AKI (acute kidney injury) ([place]) 03/25/2014  . Anxiety   . Aortic atherosclerosis ([place])   . Arthritis   . Back pain   . Benign essential HTN 03/25/2014  . Cancer [facility])    endometrial cancer  . Chronic daily headache 10/23/2017  . Chronic low back pain 07/30/2017  . Chronic pain syndrome 02/23/2014  . DDD (degenerative disc disease), lumbar   . Degeneration of lumbar intervertebral disc 05/10/2019  . Depression   . Endometrial cancer ([place]) 01/10/2016  . Expected blood loss anemia 09/02/2012  . Fibromyalgia 09/08/2012  . Gastroparesis   . Genetic testing 12/17/2017   TumorNext [facility] + CancerNext was ordered. The CancerNext gene panel offered by [employer] includes sequencing and rearrangement analysis for the following 32 genes:   APC, ATM, BARD1, BMPR1A, BRCA1, BRCA2, BRIP1, CDH1, CDK4, CDKN2A, CHEK2, DICER1, EPCAM, GREM1, HOXB13MLH1, MRE11A, MSH2, MSH6, MUTYH, NBN, NF1, PALB2, PMS2, POLD1, POLE, PTEN, RAD50, RAD51D, SMAD4,  SMARCA4, STK11, and TP53.   Ger  . History of kidney stones   . HLD (hyperlipidemia)   . Hx of sepsis 02/2014   DUE TO MULTIPLE KIDNEY STONES  . Hyperlipemia   . Hyperlipidemia 03/25/2014  . Hypertension   . HYPERTENSION, BENIGN SYSTEMIC 06/26/2006   Qualifier: Diagnosis of  By: Herma Ard    . Insomnia 09/08/2012  . Lactic acidosis 07/10/2017  . Long-term current use of opiate analgesic 05/28/2017  . Lumbar post-laminectomy syndrome 07/30/2017  . Near syncope 07/10/2017  . NEPHROLITHIASIS 06/26/2006   Qualifier: Diagnosis of  By: Herma Ard    . Obese 11/11/2018  . Obesity (BMI 30-39.9) 09/02/2012  . Obstructive uropathy 03/25/2014  . Osteoporosis   . Pain in left knee 01/08/2018  . Pneumonia    2014  . Prolactinoma ([place]) 07/16/2017  . S/P left TKA 11/10/2018  . S/P right TKA 08/31/2012  . Severe sepsis with septic shock ([place]) 03/25/2014  . UTI (urinary tract infection)     Past Surgical History:  Procedure Laterality Date  . BACK SURGERY  2013  . CARPAL TUNNEL RELEASE     R hand  . CYSTOSCOPY WITH URETEROSCOPY AND STENT PLACEMENT Bilateral 03/25/2014   Procedure: CYSTOSCOPY WITH URETEROSCOPY AND STENT PLACEMENT;  Surgeon: Raynelle Bring, MD;  Location: [facility];  Service: Urology;  Laterality: Bilateral;  . CYSTOSCOPY WITH URETEROSCOPY AND STENT PLACEMENT Bilateral 04/18/2014   Procedure: CYSTOSCOPY WITH URETEROSCOPY AND STENT PLACEMENT,RETROGRADE;  Surgeon: Raynelle Bring, MD;  Location: [facility];  Service: Urology;  Laterality: Bilateral;  . CYSTOSCOPY WITH URETEROSCOPY AND STENT PLACEMENT Right 05/30/2014   Procedure: CYSTOSCOPY WITH URETEROSCOPY AND STENT PLACEMENT;  Surgeon: Raynelle Bring, MD;  Location: [facility];  Service: Urology;  Laterality: Right;  . CYSTOSCOPY/RETROGRADE/URETEROSCOPY Left 05/30/2014   Procedure: LEFT RETROGRADE;  Surgeon: Raynelle Bring, MD;  Location[facility];  Service: Urology;  Laterality: Left;  . EYE SURGERY     cataract surgery bil  . GANGLION CYST  EXCISION     L ankle  . HAMMERTOE RECONSTRUCTION WITH WEIL OSTEOTOMY Left 09/05/2016   Procedure: Second Metatarsal Weil Osteotomy and Hammertoe Correction;  Surgeon: Wylene Simmer, MD;  Location: [facility];  Service: Orthopedics;  Laterality: Left;  . HOLMIUM LASER APPLICATION Bilateral 34/28/7681   Procedure: HOLMIUM LASER APPLICATION;  Surgeon: Raynelle Bring, MD;  Location[facility];  Service: Urology;  Laterality: Bilateral;  . JOINT REPLACEMENT     total knee  . LITHOTRIPSY    . LUMBAR FUSION  09/2011  . METATARSAL OSTEOTOMY WITH BUNIONECTOMY Left 09/05/2016   Procedure: Left First Metatarsal Scarf Osteotomy, Modified McBride Bunion Correction;  Surgeon: Wylene Simmer, MD;  Location: [facility];  Service: Orthopedics;  Laterality: Left;  . RHINOPLASTY    . ROBOTIC ASSISTED TOTAL HYSTERECTOMY WITH BILATERAL SALPINGO OOPHERECTOMY Bilateral 01/16/2016   Procedure: XI ROBOTIC ASSISTED TOTAL HYSTERECTOMY WITH BILATERAL SALPINGO OOPHORECTOMY AND BILATERAL PELVIC LYMPH NODE DISSECTION;  Surgeon: Everitt Amber, MD;  [facility];  Service: Gynecology;  Laterality: Bilateral;  . SPINAL CORD STIMULATOR INSERTION N/A 02/23/2014   Procedure: SPINAL CORD STIMULATOR PLACEMENT ;  Surgeon: Melina Schools, MD;  Location: [place];  Service: Orthopedics;  Laterality: N/A;  . TONSILLECTOMY    . TOTAL KNEE ARTHROPLASTY Right 08/31/2012   Procedure: RIGHT TOTAL KNEE ARTHROPLASTY;  Surgeon: Mauri Pole, MD;  [facility];  Service: Orthopedics;  Laterality: Right;  with LMA  . TOTAL KNEE ARTHROPLASTY Left 11/10/2018   Procedure: TOTAL KNEE ARTHROPLASTY;  Surgeon: Paralee Cancel, MD;  [facility];  Service: Orthopedics;  Laterality: Left;  70 mins  . TUBAL LIGATION      Current Medications: Current Meds  Medication Sig  . ALPRAZolam (XANAX) 0.25 MG tablet Take 0.25 mg by mouth 3 (three) times daily as needed for anxiety.   Marland Kitchen amitriptyline (ELAVIL) 75 MG tablet Take 1 tablet (75  mg total) by mouth at bedtime.  Marland Kitchen aspirin 81 MG chewable tablet Chew 1 tablet by mouth daily.  . B Complex Vitamins (B COMPLEX PO) Take 1 tablet by mouth daily.  . cabergoline (DOSTINEX) 0.5 MG tablet Take 1.5 mg by mouth See admin instructions. Patient takes tablets 1.5 mg on Tuesday and friday  . Calcium Citrate-Vitamin D 250-200 MG-UNIT TABS Take 1 tablet by mouth in the morning and at bedtime.  . Cholecalciferol (VITAMIN D3) 125 MCG (5000 UT) CAPS Take 1 capsule by mouth daily.  Marland Kitchen docusate sodium (COLACE) 50 MG capsule Take 50 mg by mouth daily as needed.  . fluticasone (FLONASE) 50 MCG/ACT nasal spray Place 1 spray into both nostrils daily.  Marland Kitchen levothyroxine (SYNTHROID) 25 MCG tablet Take 25 mcg by mouth daily.  . Magnesium 500 MG TABS Take 250 mg by mouth daily.   . Melatonin 10 MG CAPS Take 10 mg by mouth at bedtime.  . metoprolol succinate (TOPROL-XL) 25 MG 24 hr tablet Take 25 mg by mouth daily.   . montelukast (SINGULAIR) 10 MG tablet Take 10 mg by mouth at bedtime.   Marland Kitchen  Multiple Vitamin (MULTIVITAMIN WITH MINERALS) TABS Take 1 tablet by mouth daily.  . Multiple Vitamins-Minerals (HAIR SKIN AND NAILS FORMULA PO) Take 1 capsule by mouth daily.   . naloxone (NARCAN) nasal spray 4 mg/0.1 mL Place 1 spray into the nose See admin instructions. Take by nasal route every 3 minutes until patient awakes or EMS arrives.  . Omega-3 Fatty Acids (FISH OIL) 1000 MG CAPS Take 1 capsule by mouth daily.  Marland Kitchen omeprazole (PRILOSEC) 20 MG capsule Take 1 capsule (20 mg total) by mouth daily.  . ondansetron (ZOFRAN) 4 MG tablet Take 1 tablet (4 mg total) by mouth every 8 (eight) hours as needed for nausea or vomiting.  . polyethylene glycol (MIRALAX / GLYCOLAX) 17 g packet Take 17 g by mouth 2 (two) times daily.  . pravastatin (PRAVACHOL) 40 MG tablet Take 1 tablet (40 mg total) by mouth at bedtime.  Marland Kitchen tiZANidine (ZANAFLEX) 2 MG tablet Take 1 tablet by mouth 3 (three) times daily as needed.  . traZODone  (DESYREL) 100 MG tablet Take 150 mg by mouth at bedtime.      Allergies:   Augmentin [amoxicillin-pot clavulanate] and Wellbutrin [bupropion]   Social History   Socioeconomic History  . Marital status: Divorced    Spouse name: Not on file  . Number of children: Not on file  . Years of education: Not on file  . Highest education level: Not on file  Occupational History  . Not on file  Tobacco Use  . Smoking status: Never Smoker  . Smokeless tobacco: Never Used  Vaping Use  . Vaping Use: Never used  Substance and Sexual Activity  . Alcohol use: No  . Drug use: No  . Sexual activity: Yes  Other Topics Concern  . Not on file  Social History Narrative  . Not on file   Social Determinants of Health   Financial Resource Strain:   . Difficulty of Paying Living Expenses:   Food Insecurity:   . Worried About Charity fundraiser in the Last Year:   . Arboriculturist in the Last Year:   Transportation Needs:   . Film/video editor (Medical):   Marland Kitchen Lack of Transportation (Non-Medical):   Physical Activity:   . Days of Exercise per Week:   . Minutes of Exercise per Session:   Stress:   . Feeling of Stress :   Social Connections:   . Frequency of Communication with Friends and Family:   . Frequency of Social Gatherings with Friends and Family:   . Attends Religious Services:   . Active Member of Clubs or Organizations:   . Attends Archivist Meetings:   Marland Kitchen Marital Status:      Family History: The patient's family history includes COPD in her mother; Emphysema in her father and mother; Heart disease in her father; Stroke in her father and mother. ROS:   Please see the history of present illness.    All 14 point review of systems negative except as described per history of present illness  EKGs/Labs/Other Studies Reviewed:      Recent Labs: 07/15/2019: Hemoglobin 14.2; Platelets 293 10/01/2019: ALT 21; BUN 13; Creatinine 0.91; Potassium 4.1; Sodium 144  Recent  Lipid Panel No results found for: CHOL, TRIG, HDL, CHOLHDL, VLDL, LDLCALC, LDLDIRECT  Physical Exam:    VS:  BP 118/78 (BP Location: Right Arm, Patient Position: Sitting, Cuff Size: Normal)   Pulse 100   Ht 5' 1"  (1.549 m)  Wt 195 lb (88.5 kg)   SpO2 95%   BMI 36.84 kg/m     Wt Readings from Last 3 Encounters:  10/18/19 195 lb (88.5 kg)  10/05/19 197 lb 6.4 oz (89.5 kg)  08/26/19 202 lb (91.6 kg)     GEN:  Well nourished, well developed in no acute distress HEENT: Normal NECK: No JVD; No carotid bruits LYMPHATICS: No lymphadenopathy CARDIAC: RRR, no murmurs, no rubs, no gallops RESPIRATORY:  Clear to auscultation without rales, wheezing or rhonchi  ABDOMEN: Soft, non-tender, non-distended MUSCULOSKELETAL:  No edema; No deformity  SKIN: Warm and dry LOWER EXTREMITIES: no swelling NEUROLOGIC:  Alert and oriented x 3 PSYCHIATRIC:  Normal affect   ASSESSMENT:    1. Benign essential HTN   2. Fibromyalgia   3. Dyspnea on exertion    PLAN:    In order of problems listed above:  1. Cardiac work-up so Far is negative.  Stress test negative, echocardiogram showed preserved left ventricle ejection fraction.  Her fatigue tiredness look like it is not related to her heart.  We may need to investigate B12 level as well as vitamin D3.  She also complained of having some dry mouth may be she does have some rheumatological problem. 2. Fibromyalgia: Follow-up by internal medicine team 3. Benign essential hypertension well-controlled 4. Dyspnea and exertion: Probably multifactorial.  We will continue present management.   Medication Adjustments/Labs and Tests Ordered: Current medicines are reviewed at length with the patient today.  Concerns regarding medicines are outlined above.  No orders of the defined types were placed in this encounter.  Medication changes: No orders of the defined types were placed in this encounter.   Signed, Park Liter, MD, [facility] 10/18/2019 2:11  PM    [place]

## 2019-10-20 DIAGNOSIS — B9689 Other specified bacterial agents as the cause of diseases classified elsewhere: Secondary | ICD-10-CM | POA: Diagnosis not present

## 2019-10-20 DIAGNOSIS — R531 Weakness: Secondary | ICD-10-CM | POA: Diagnosis not present

## 2019-10-20 DIAGNOSIS — N3000 Acute cystitis without hematuria: Secondary | ICD-10-CM | POA: Diagnosis not present

## 2019-10-28 ENCOUNTER — Other Ambulatory Visit: Payer: Self-pay | Admitting: Internal Medicine

## 2019-10-28 NOTE — Telephone Encounter (Signed)
Recently filled.  Pharmacy is asking for a 90 day refill.

## 2019-11-03 DIAGNOSIS — N132 Hydronephrosis with renal and ureteral calculous obstruction: Secondary | ICD-10-CM | POA: Diagnosis not present

## 2019-11-03 DIAGNOSIS — R8271 Bacteriuria: Secondary | ICD-10-CM | POA: Diagnosis not present

## 2019-11-04 ENCOUNTER — Other Ambulatory Visit: Payer: Self-pay | Admitting: Urology

## 2019-11-19 DIAGNOSIS — E860 Dehydration: Secondary | ICD-10-CM | POA: Diagnosis not present

## 2019-11-19 DIAGNOSIS — Z87442 Personal history of urinary calculi: Secondary | ICD-10-CM | POA: Diagnosis not present

## 2019-11-19 DIAGNOSIS — M5431 Sciatica, right side: Secondary | ICD-10-CM | POA: Diagnosis not present

## 2019-11-19 NOTE — Patient Instructions (Addendum)
DUE TO COVID-19 ONLY ONE VISITOR IS ALLOWED TO COME WITH YOU AND STAY IN THE WAITING ROOM ONLY DURING PRE OP AND PROCEDURE DAY OF SURGERY. THE 1 VISITOR MAY VISIT WITH YOU AFTER SURGERY IN YOUR PRIVATE ROOM DURING VISITING HOURS ONLY!  YOU NEED TO HAVE A COVID 19 TEST ON 11-22-19 @ 12:40 PM, THIS TEST MUST BE DONE BEFORE SURGERY, COME  [address], [place] , [id number].  ([place]) ONCE YOUR COVID TEST IS COMPLETED, PLEASE BEGIN THE QUARANTINE INSTRUCTIONS AS OUTLINED IN YOUR HANDOUT.                JOZELYN KUWAHARA  11/19/2019   Your procedure is scheduled on: 11-25-19   Report to [facility] Main  Entrance    Report to Admitting at 10:45 AM     Call this number if you have problems the morning of surgery [phone]    Remember: Do not eat food or drink liquids :After Midnight.     Take these medicines the morning of surgery with A SIP OF WATER: Alprazolam (Xanax), Levothyroxine (Synthroid), Metoprolol Succinate (Toprol-XL), and Oxy IR, prn  BRUSH YOUR TEETH MORNING OF SURGERY AND RINSE YOUR MOUTH OUT, NO CHEWING GUM CANDY OR MINTS.                             You may not have any metal on your body including hair pins and              piercings     Do not wear jewelry, make-up, lotions, powders or perfumes, deodorant              Do not wear nail polish on your fingernails.  Do not shave  48 hours prior to surgery.             Do not bring valuables to the hospital. [place].  Contacts, dentures or bridgework may not be worn into surgery.       Patients discharged the day of surgery will not be allowed to drive home. IF YOU ARE HAVING SURGERY AND GOING HOME THE SAME DAY, YOU MUST HAVE AN ADULT TO DRIVE YOU HOME AND BE WITH YOU FOR 24 HOURS. YOU MAY GO HOME BY TAXI OR UBER OR ORTHERWISE, BUT AN ADULT MUST ACCOMPANY YOU HOME AND STAY WITH YOU FOR 24 HOURS.  Name and phone number of your driver:  [employer] [phone]  Special Instructions: N/A              Please read over the following fact sheets you were given: _____________________________________________________________________             [facility] - Preparing for Surgery Before surgery, you can play an important role.  Because skin is not sterile, your skin needs to be as free of germs as possible.  You can reduce the number of germs on your skin by washing with CHG (chlorahexidine gluconate) soap before surgery.  CHG is an antiseptic cleaner which kills germs and bonds with the skin to continue killing germs even after washing. Please DO NOT use if you have an allergy to CHG or antibacterial soaps.  If your skin becomes reddened/irritated stop using the CHG and inform your nurse when you arrive at Short Stay. Do not shave (including legs  and underarms) for at least 48 hours prior to the first CHG shower.  You may shave your face/neck. Please follow these instructions carefully:  1.  Shower with CHG Soap the night before surgery and the  morning of Surgery.  2.  If you choose to wash your hair, wash your hair first as usual with your  normal  shampoo.  3.  After you shampoo, rinse your hair and body thoroughly to remove the  shampoo.                           4.  Use CHG as you would any other liquid soap.  You can apply chg directly  to the skin and wash                       Gently with a scrungie or clean washcloth.  5.  Apply the CHG Soap to your body ONLY FROM THE NECK DOWN.   Do not use on face/ open                           Wound or open sores. Avoid contact with eyes, ears mouth and genitals (private parts).                       Wash face,  Genitals (private parts) with your normal soap.             6.  Wash thoroughly, paying special attention to the area where your surgery  will be performed.  7.  Thoroughly rinse your body with warm water from the neck down.  8.  DO NOT shower/wash with your normal soap  after using and rinsing off  the CHG Soap.                9.  Pat yourself dry with a clean towel.            10.  Wear clean pajamas.            11.  Place clean sheets on your bed the night of your first shower and do not  sleep with pets. Day of Surgery : Do not apply any lotions/deodorants the morning of surgery.  Please wear clean clothes to the hospital/surgery center.  FAILURE TO FOLLOW THESE INSTRUCTIONS MAY RESULT IN THE CANCELLATION OF YOUR SURGERY PATIENT SIGNATURE_________________________________  NURSE SIGNATURE__________________________________  ________________________________________________________________________

## 2019-11-19 NOTE — Progress Notes (Addendum)
COVID Vaccine Completed: Date COVID Vaccine completed: COVID vaccine manufacturer: [place]   PCP - Ernestene Kiel, MD Cardiologist - Jenne Campus, MD w/F/u 1 year  Spinal Stimulator in place. Pt reports that she will turn it off for the procedure  Chest x-ray -  EKG - 08-19-19 Stress Test - 08-26-19 ECHO - 09-09-19 Cardiac Cath -   Sleep Study -  CPAP -   Fasting Blood Sugar -  Checks Blood Sugar _____ times a day  Blood Thinner Instructions: Aspirin Instructions: 81 mg Last dose 11-20-19 Last Dose:  Anesthesia review:   Patient denies shortness of breath, fever, cough and chest pain at PAT appointment   Patient verbalized understanding of instructions that were given to them at the PAT appointment. Patient was also instructed that they will need to review over the PAT instructions again at home before surgery.

## 2019-11-22 ENCOUNTER — Other Ambulatory Visit: Payer: Self-pay

## 2019-11-22 ENCOUNTER — Other Ambulatory Visit (HOSPITAL_COMMUNITY)
Admission: RE | Admit: 2019-11-22 | Discharge: 2019-11-22 | Disposition: A | Discharge status: Home / Self Care | Payer: Medicare Other | Source: Ambulatory Visit | Attending: Urology | Admitting: Urology

## 2019-11-22 ENCOUNTER — Encounter (HOSPITAL_COMMUNITY)
Admission: RE | Admit: 2019-11-22 | Discharge: 2019-11-22 | Disposition: A | Discharge status: Home / Self Care | Payer: Medicare Other | Source: Ambulatory Visit | Attending: Urology | Admitting: Urology

## 2019-11-22 ENCOUNTER — Encounter (HOSPITAL_COMMUNITY): Payer: Self-pay

## 2019-11-22 DIAGNOSIS — Z01812 Encounter for preprocedural laboratory examination: Secondary | ICD-10-CM | POA: Diagnosis not present

## 2019-11-22 DIAGNOSIS — Z20822 Contact with and (suspected) exposure to covid-19: Secondary | ICD-10-CM | POA: Insufficient documentation

## 2019-11-22 LAB — CBC
HCT: 42.7 % (ref 36.0–46.0)
Hemoglobin: 13.5 g/dL (ref 12.0–15.0)
MCH: 32 pg (ref 26.0–34.0)
MCHC: 31.6 g/dL (ref 30.0–36.0)
MCV: 101.2 fL — ABNORMAL HIGH (ref 80.0–100.0)
Platelets: 217 10*3/uL (ref 150–400)
RBC: 4.22 MIL/uL (ref 3.87–5.11)
RDW: 12.9 % (ref 11.5–15.5)
WBC: 8.9 10*3/uL (ref 4.0–10.5)
nRBC: 0 % (ref 0.0–0.2)

## 2019-11-22 LAB — BASIC METABOLIC PANEL
Anion gap: 7 (ref 5–15)
BUN: 23 mg/dL (ref 8–23)
CO2: 31 mmol/L (ref 22–32)
Calcium: 9.4 mg/dL (ref 8.9–10.3)
Chloride: 102 mmol/L (ref 98–111)
Creatinine, Ser: 0.71 mg/dL (ref 0.44–1.00)
GFR calc Af Amer: >60 mL/min (ref >60)
GFR calc non Af Amer: >60 mL/min (ref >60)
Glucose, Bld: 107 mg/dL — ABNORMAL HIGH (ref 70–99)
Potassium: 4.2 mmol/L (ref 3.5–5.1)
Sodium: 140 mmol/L (ref 135–145)

## 2019-11-22 LAB — SARS CORONAVIRUS 2 (TAT 6-24 HRS): SARS Coronavirus 2: NEGATIVE

## 2019-11-24 DIAGNOSIS — Z471 Aftercare following joint replacement surgery: Secondary | ICD-10-CM | POA: Diagnosis not present

## 2019-11-24 DIAGNOSIS — Z96652 Presence of left artificial knee joint: Secondary | ICD-10-CM | POA: Diagnosis not present

## 2019-11-24 DIAGNOSIS — E038 Other specified hypothyroidism: Secondary | ICD-10-CM | POA: Diagnosis not present

## 2019-11-24 DIAGNOSIS — D352 Benign neoplasm of pituitary gland: Secondary | ICD-10-CM | POA: Diagnosis not present

## 2019-11-24 DIAGNOSIS — E221 Hyperprolactinemia: Secondary | ICD-10-CM | POA: Diagnosis not present

## 2019-11-24 NOTE — H&P (Signed)
Office Visit Report     11/03/2019   --------------------------------------------------------------------------------   Kimberly Larsen  MRN: [id number]  DOB: 1946-03-27, 74 year old Female  SSN: [id number]   PRIMARY CARE:  Olin Hauser A. Burnett Sheng, MD  REFERRING:  Raynelle Bring, Eduardo Osier  PROVIDER:  Raynelle Bring, M.D.  TREATING:  Daine Gravel, NP  LOCATION:  [facility] [phone]     --------------------------------------------------------------------------------   CC/HPI: 1. Bilateral renal calculi  2. History of recurrent UTIs   [name] returns today due to the fact that she is seeing decreased urine output recently despite which he feels is appropriate hydration. She denies any significant flank pain or hematuria. She denies any dysuria or urinary frequency/urgency. Notably, her prior infections and episodes of sepsis have occurred when she has relatively little symptoms of a urinary tract infection. She presents today mainly to make sure that her renal function is okay considering her decreased urine output. She recently has been treated with doxycycline for [facility] spotted fever. She has completed this course as of yesterday.   11/03/19: Kimberly Larsen is a 74 year old female with a past medical history of bilateral nephrolithiasis and recurrent UTIs. She presents today stating she just has not felt well after recovering from [facility] spotted fever. She reports left sided flank discomfort, intermittent nausea, frequency of urination and fatigue that is severe. She denies vomiting, fevers, chills and gross hematuria.     ALLERGIES: Augmentin - upset stomach Wellbutrin - upset stomach    MEDICATIONS: Hydrochlorothiazide 25 mg tablet  Levaquin 750 mg tablet  Levothyroxine Sodium  Omeprazole  Amitriptyline Hcl 75 mg tablet  Amlodipine Besylate 5 mg tablet  Aspir 81 81 mg tablet, delayed release Oral  Cabergoline 0.5 mg tablet  Calcium Citrate - Vitamin D   Cymbalta 60 mg capsule,delayed release Oral  Fish Oil 300 mg (120 mg-180 mg)-1,000 mg capsule,delayed release Oral  Fluticasone Propionate 50 mcg/actuation spray, suspension Nasal  Fosamax  Hair, Skin, Nails  Lamisil  Melatonin 10 mg tablet  Metoprolol Succinate 25 mg tablet, extended release 24 hr Oral  Montelukast Sodium  Multivitamins tablet Oral  Ondansetron Hcl  Oxycodone-Acetaminophen 10 mg-325 mg tablet  Pilocarpine Hcl 5 mg tablet  Pravastatin Sodium 40 mg tablet Oral  Ranitidine Hcl  Stool Softner  Super B Complex tablet Oral  Trazodone Hcl 100 mg tablet Oral  Vitamin B-12  Vitamin C  Xanax 0.25 mg tablet  Zinc 50 mg tablet     GU PSH: Catheterize For Residual - 2017 Cysto Uretero Lithotripsy - 2016 Cystoscopy Insert Stent - 2016, 2016, 2015 ESWL - 2015 Hysterectomy - 2017, 2017 Ureteroscopic stone removal - 2016       PSH Notes: Arthrocentesis Aspirate Ganglion Cyst Of Intermediate Joint     NON-GU PSH: Back surgery Drain/inject Joint/bursa - 2015 Knee replacement, Left Reconstruct Nose - 2015 Remove Tonsils - 2015 Revise Knee Joint - 2015 Stimulate Spinal Cord - 2015 Total Knee Replacement - 2015 Tubal Ligation - 2015     GU PMH: Acute Cystitis/UTI - 12/11/2018 Renal calculus - 2018 Ureteral calculus, Left, It appears that left ureteral calculus has passed. She understands if she develops any acute changes ie pain, hematuria, or fever RTC for KUB and possible repeat CT urogram - 2017 Endometrium Cancer - 2017 Abnormal uterine and vaginal bleeding, unspecified (Acute), Refer to GYN - 2017 Urinary Calculus, Unspec, Urolithiasis - 2016 Ureteral obstruction secondary to calculous, Ureteral stone with hydronephrosis - 2016 Kidney Failure, acute,  Unspec, Acute kidney injury - 2015 Urinary Tract Inf, Unspec site, Urinary tract infection - 2015 Uterine Cancer, part Unspec      PMH Notes:   1) Urolithiasis: She has a history of recurrent urolithiasis  and has undergone treatment with SWL and ureteroscopy in the 1990s. She initially presented to me in November 2015 with bilateral ureteral and renal stones and sepsis requiring emergent stent placement and ICU admission.   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  Does not use smokeless tobacco. Has never drank.  Does not use drugs. Drinks 2 caffeinated drinks per day. 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    REVIEW OF SYSTEMS:    GU Review Female:   Patient reports frequent urination. Patient denies hard to postpone urination, burning /pain with urination, get up at night to urinate, leakage of urine, stream starts and stops, trouble starting your stream, have to strain to urinate, and being pregnant.  Gastrointestinal (Upper):   Patient reports nausea. Patient denies vomiting and indigestion/ heartburn.  Gastrointestinal (Lower):   Patient denies diarrhea and constipation.  Constitutional:   Patient reports fatigue. Patient denies fever, night sweats, and weight loss.  Skin:   Patient denies itching and  skin rash/ lesion.  Eyes:   Patient denies blurred vision and double vision.  Ears/ Nose/ Throat:   Patient denies sore throat and sinus problems.  Hematologic/Lymphatic:   Patient reports easy bruising. Patient denies swollen glands.  Cardiovascular:   Patient denies leg swelling and chest pains.  Respiratory:   Patient denies cough and shortness of breath.  Endocrine:   Patient denies excessive thirst.  Musculoskeletal:   Patient denies back pain and joint pain.  Neurological:   Patient denies headaches and dizziness.  Psychologic:   Patient denies depression and anxiety.   VITAL SIGNS:      11/03/2019 12:39 PM  Weight 191.4 lb / 86.82 kg  Height 61 in / 154.94 cm  BP 136/81 mmHg  Pulse 93 /min  Temperature 97.3 F / 36.2 C  BMI 36.2 kg/m   GU PHYSICAL EXAMINATION:      Notes: LEft CVA tenderness   MULTI-SYSTEM PHYSICAL EXAMINATION:    Constitutional: Well-nourished. No physical deformities. Normally developed. Good grooming.   Respiratory: Labored breathing. No use of accessory muscles.   Cardiovascular: Normal temperature, normal extremity pulses, no swelling, no varicosities.  Skin: No paleness, no jaundice, no cyanosis. No lesion, no ulcer, no rash.   Neurologic / Psychiatric: Oriented to time, oriented to place, oriented to person. No depression, no anxiety, no agitation.  Gastrointestinal: No mass, no tenderness, no rigidity, non obese abdomen.     Complexity of Data:  Source Of History:  Patient, Medical Record  Summary  Records Review:   Previous Doctor Records, Previous Hospital Records, Previous Patient Records  Urine Test Review:   Urinalysis, Urine Culture  X-Ray Review: KUB: Reviewed Films. Discussed With Patient.  C.T. Abdomen/Pelvis: Reviewed Films. Discussed With Patient.     11/03/19 11/03/19  General Chemistry  BUN 17 mg/dL   Creatinine 0.6 mg/dL   eGFR African American 106.3    eGFR Non-Afr. American 91.7    Urinalysis  Urine Appearance Clear  Clear    Urine Color Yellow  Yellow   Urine Glucose Neg mg/dL Neg   Urine Bilirubin Neg mg/dL Neg   Urine Ketones Neg mg/dL Neg   Urine Specific Gravity 1.025  1.025   Urine Blood Neg ery/uL Neg   Urine pH 6.0  6.0   Urine Protein Trace mg/dL Trace   Urine Urobilinogen 0.2 mg/dL 0.2   Urine Nitrites Neg  Neg   Urine Leukocyte Esterase Neg leu/uL Neg    PROCEDURES:         C.T. Urogram - [id number]      Patient confirmed No Neulasta OnPro Device.          KUB - [id number]  A single view of the abdomen is obtained. Spinal stimulation device noted. Bilateral renal shadows are well visualized. Along the expected course of the left near the UPJ there appears to be a 5-6 mm opacity consistent with an obstructing calculus. There is a slight overlying bowel gas pattern and the spinal stimulation also obscures some of the view. There does appear to be another small opacity within the left renal shadow.      Patient confirmed No Neulasta OnPro Device.            Urinalysis - 81003 Dipstick Dipstick Cont'd  Color: Yellow Bilirubin: Neg  Appearance: Clear Ketones: Neg  Specific Gravity: 1.025 Blood: Neg  pH: 6.0 Protein: Trace  Glucose: Neg Urobilinogen: 0.2    Nitrites: Neg    Leukocyte Esterase: Neg    Notes:      ASSESSMENT:      ICD-10 Details  1 GU:   Ureteral obstruction secondary to calculous - N13.2 Left, Acute, Systemic Symptoms   PLAN:            Medications New Meds: Tamsulosin Hcl 0.4 mg capsule 1 capsule PO Q HS   #20  0 Refill(s)  Oxycodone Hcl 5 mg tablet 1-2 tablet PO Q 6 H PRN Severe pain  #20  0 Refill(s)            Orders Labs BUN/Creatinine, CULTURE, URINE  X-Rays: C.T. Stone Protocol Without Contrast    KUB  X-Ray Notes: History:  Hematuria: Yes/No  Patient to see MD after exam: Yes/No  Previous exam: CT / IVP/ US/ KUB/ None  When:  Where:  Diabetic: Yes/ No  BUN/ Creatine:  Date of last BUN Creatinine:  Weight in pounds:  Allergy- Contrasts/  Shellfish: Yes/ No  Conflicting diabetic meds: Yes/ No  Oral contrast and instructions given to patient:   Prior Authorization #: NPCR            Schedule Return Visit/Planned Activity: Next Available Appointment - Schedule Surgery          Document Letter(s):  Created for Patient: Clinical Summary         Notes:   Urinalysis is not concerning for a an infectious today. Will send for precautionary culture. CT scan was obtained which did show a 5 mm obstructing UPJ stone  causing severe hydronephrosis. A KUB was obtained. However due to spinal stimulation, it was determined that a ureteroscopy over an ESWL would be most beneficial. Ureteroscopy risks discussed in detail today. She understands that there is a risk of bleeding, infection and injury to surrounding structures as well as possibility of procedure failure. She agreed to plan, and a surgical posting sheet was handed to Dr. Lynne Logan scheduler. She was given a strainer to strain her urine as well as a prescription for tamsulosin and pain medication. BUN and creatinine was drawn today. I will notify her if she has had any decrease to her renal function that would cause the procedure to be emergent. Strict return precautions advised in the interval including fever, chills, worsening pain, uncontrolled nausea or vomiting.        Next Appointment:      Next Appointment: 11/25/2019 12:45 PM    Appointment Type: Surgery     Location: [facility] [phone]    Provider: Raynelle Bring, M.D.    Reason for Visit: WL/OP CYSTO, LEFT URS WITH HLL, RPG, STENT PLACEMENT      * Signed by Daine Gravel, NP on 11/05/19 at 7:46 AM (EDT)*     The information contained in this medical record document is considered private and confidential patient information

## 2019-11-25 ENCOUNTER — Ambulatory Visit (HOSPITAL_COMMUNITY): Payer: Medicare Other | Admitting: Certified Registered"

## 2019-11-25 ENCOUNTER — Encounter (HOSPITAL_COMMUNITY): Payer: Self-pay | Admitting: Urology

## 2019-11-25 ENCOUNTER — Encounter (HOSPITAL_COMMUNITY)
Admission: RE | Disposition: A | Discharge status: Home / Self Care | Payer: Self-pay | Source: Home / Self Care | Attending: Urology

## 2019-11-25 ENCOUNTER — Ambulatory Visit (HOSPITAL_COMMUNITY): Payer: Medicare Other

## 2019-11-25 ENCOUNTER — Ambulatory Visit (HOSPITAL_COMMUNITY)
Admission: RE | Admit: 2019-11-25 | Discharge: 2019-11-25 | Disposition: A | Discharge status: Home / Self Care | Payer: Medicare Other | Attending: Urology | Admitting: Urology

## 2019-11-25 DIAGNOSIS — Z7982 Long term (current) use of aspirin: Secondary | ICD-10-CM | POA: Insufficient documentation

## 2019-11-25 DIAGNOSIS — Z79890 Hormone replacement therapy (postmenopausal): Secondary | ICD-10-CM | POA: Insufficient documentation

## 2019-11-25 DIAGNOSIS — N131 Hydronephrosis with ureteral stricture, not elsewhere classified: Secondary | ICD-10-CM | POA: Diagnosis not present

## 2019-11-25 DIAGNOSIS — I10 Essential (primary) hypertension: Secondary | ICD-10-CM | POA: Diagnosis not present

## 2019-11-25 DIAGNOSIS — E7800 Pure hypercholesterolemia, unspecified: Secondary | ICD-10-CM | POA: Diagnosis not present

## 2019-11-25 DIAGNOSIS — N201 Calculus of ureter: Secondary | ICD-10-CM | POA: Insufficient documentation

## 2019-11-25 DIAGNOSIS — M1990 Unspecified osteoarthritis, unspecified site: Secondary | ICD-10-CM | POA: Insufficient documentation

## 2019-11-25 DIAGNOSIS — F418 Other specified anxiety disorders: Secondary | ICD-10-CM | POA: Insufficient documentation

## 2019-11-25 DIAGNOSIS — Z96659 Presence of unspecified artificial knee joint: Secondary | ICD-10-CM | POA: Diagnosis not present

## 2019-11-25 DIAGNOSIS — Z79899 Other long term (current) drug therapy: Secondary | ICD-10-CM | POA: Insufficient documentation

## 2019-11-25 DIAGNOSIS — E039 Hypothyroidism, unspecified: Secondary | ICD-10-CM | POA: Diagnosis not present

## 2019-11-25 HISTORY — PX: CYSTOSCOPY/URETEROSCOPY/HOLMIUM LASER/STENT PLACEMENT: SHX6546

## 2019-11-25 SURGERY — CYSTOSCOPY/URETEROSCOPY/HOLMIUM LASER/STENT PLACEMENT
Anesthesia: General | Laterality: Left

## 2019-11-25 MED ORDER — DEXAMETHASONE SODIUM PHOSPHATE 10 MG/ML IJ SOLN
INTRAMUSCULAR | Status: DC | PRN
  Administered 2019-11-25: 4 mg via INTRAVENOUS

## 2019-11-25 MED ORDER — ONDANSETRON HCL 4 MG/2ML IJ SOLN
INTRAMUSCULAR | Status: DC | PRN
  Administered 2019-11-25: 4 mg via INTRAVENOUS

## 2019-11-25 MED ORDER — PROPOFOL 10 MG/ML IV BOLUS
INTRAVENOUS | Status: AC
  Filled 2019-11-25: qty 20

## 2019-11-25 MED ORDER — FENTANYL CITRATE (PF) 100 MCG/2ML IJ SOLN
INTRAMUSCULAR | Status: DC | PRN
  Administered 2019-11-25: 25 ug via INTRAVENOUS
  Administered 2019-11-25: 50 ug via INTRAVENOUS
  Administered 2019-11-25: 25 ug via INTRAVENOUS

## 2019-11-25 MED ORDER — CHLORHEXIDINE GLUCONATE 0.12 % MT SOLN
15.0000 mL | Freq: Once | OROMUCOSAL | Status: AC
  Administered 2019-11-25: 15 mL via OROMUCOSAL

## 2019-11-25 MED ORDER — PROPOFOL 10 MG/ML IV BOLUS
INTRAVENOUS | Status: DC | PRN
  Administered 2019-11-25: 170 mg via INTRAVENOUS

## 2019-11-25 MED ORDER — ACETAMINOPHEN 10 MG/ML IV SOLN: 1000.0000 mg | Freq: Once | INTRAVENOUS | Status: DC | PRN

## 2019-11-25 MED ORDER — CEFAZOLIN SODIUM-DEXTROSE 2-4 GM/100ML-% IV SOLN
2.0000 g | Freq: Once | INTRAVENOUS | Status: AC
  Administered 2019-11-25: 2 g via INTRAVENOUS
  Filled 2019-11-25: qty 100

## 2019-11-25 MED ORDER — HYDROCODONE-ACETAMINOPHEN 5-325 MG PO TABS: 1.0000 | ORAL_TABLET | Freq: Four times a day (QID) | ORAL | 0 refills | Status: DC | PRN

## 2019-11-25 MED ORDER — LIDOCAINE 2% (20 MG/ML) 5 ML SYRINGE
INTRAMUSCULAR | Status: DC | PRN
  Administered 2019-11-25: 40 mg via INTRAVENOUS

## 2019-11-25 MED ORDER — FENTANYL CITRATE (PF) 100 MCG/2ML IJ SOLN: 25.0000 ug | INTRAMUSCULAR | Status: DC | PRN

## 2019-11-25 MED ORDER — LACTATED RINGERS IV SOLN: INTRAVENOUS | Status: DC

## 2019-11-25 MED ORDER — FENTANYL CITRATE (PF) 100 MCG/2ML IJ SOLN
INTRAMUSCULAR | Status: AC
  Filled 2019-11-25: qty 2

## 2019-11-25 MED ORDER — SODIUM CHLORIDE 0.9 % IR SOLN
Status: DC | PRN
  Administered 2019-11-25: 3000 mL

## 2019-11-25 MED ORDER — PHENAZOPYRIDINE HCL 200 MG PO TABS
200.0000 mg | ORAL_TABLET | Freq: Three times a day (TID) | ORAL | 0 refills | Status: DC | PRN
Start: 2019-11-25 — End: 2019-12-04

## 2019-11-25 MED ORDER — ORAL CARE MOUTH RINSE: 15.0000 mL | Freq: Once | OROMUCOSAL | Status: AC

## 2019-11-25 MED ORDER — IOHEXOL 300 MG/ML  SOLN
INTRAMUSCULAR | Status: DC | PRN
  Administered 2019-11-25: 3 mL

## 2019-11-25 MED ORDER — ONDANSETRON HCL 4 MG/2ML IJ SOLN
INTRAMUSCULAR | Status: AC
  Filled 2019-11-25: qty 2

## 2019-11-25 MED ORDER — ONDANSETRON HCL 4 MG/2ML IJ SOLN: 4.0000 mg | Freq: Once | INTRAMUSCULAR | Status: DC | PRN

## 2019-11-25 MED ORDER — LIDOCAINE 2% (20 MG/ML) 5 ML SYRINGE
INTRAMUSCULAR | Status: AC
  Filled 2019-11-25: qty 5

## 2019-11-25 SURGICAL SUPPLY — 22 items
BAG URO CATCHER STRL LF (MISCELLANEOUS) ×2 IMPLANT
BASKET ZERO TIP NITINOL 2.4FR (BASKET) IMPLANT
BSKT STON RTRVL ZERO TP 2.4FR (BASKET)
CATH INTERMIT  6FR 70CM (CATHETERS) ×1 IMPLANT
CLOTH BEACON ORANGE TIMEOUT ST (SAFETY) ×2 IMPLANT
EXTRACTOR STONE 1.7FRX115CM (UROLOGICAL SUPPLIES) ×1 IMPLANT
GLOVE BIOGEL M STRL SZ7.5 (GLOVE) ×2 IMPLANT
GOWN STRL REUS W/TWL LRG LVL3 (GOWN DISPOSABLE) ×4 IMPLANT
GUIDEWIRE ANG ZIPWIRE 038X150 (WIRE) IMPLANT
GUIDEWIRE STR DUAL SENSOR (WIRE) ×2 IMPLANT
IV NS 1000ML (IV SOLUTION) ×2
IV NS 1000ML BAXH (IV SOLUTION) ×1 IMPLANT
KIT TURNOVER KIT A (KITS) IMPLANT
LASER FIB FLEXIVA PULSE ID 365 (Laser) IMPLANT
MANIFOLD NEPTUNE II (INSTRUMENTS) ×2 IMPLANT
PACK CYSTO (CUSTOM PROCEDURE TRAY) ×2 IMPLANT
SHEATH URETERAL 12FRX35CM (MISCELLANEOUS) IMPLANT
STENT URET 6FRX24 CONTOUR (STENTS) ×1 IMPLANT
TRACTIP FLEXIVA PULS ID 200XHI (Laser) IMPLANT
TRACTIP FLEXIVA PULSE ID 200 (Laser) ×2
TUBING CONNECTING 10 (TUBING) ×2 IMPLANT
TUBING UROLOGY SET (TUBING) ×2 IMPLANT

## 2019-11-25 NOTE — Anesthesia Preprocedure Evaluation (Addendum)
Anesthesia Evaluation  Patient identified by MRN, date of birth, ID band Patient awake    Reviewed: Allergy & Precautions, NPO status , Patient's Chart, lab work & pertinent test results  Airway Mallampati: III  TM Distance: >3 FB Neck ROM: Full    Dental no notable dental hx.    Pulmonary neg pulmonary ROS,    Pulmonary exam normal breath sounds clear to auscultation       Cardiovascular hypertension, Pt. on home beta blockers Normal cardiovascular exam Rhythm:Regular Rate:Normal  ECHO: 1. Left ventricular ejection fraction, by estimation, is 55 to 60%. The left ventricle has normal function. The left ventricle has no regional wall motion abnormalities. Left ventricular diastolic parameters were normal. 2. Right ventricular systolic function is normal. The right ventricular size is normal. There is normal pulmonary artery systolic pressure. 3. The mitral valve is normal in structure. Mild mitral valve regurgitation. No evidence of mitral stenosis. 4. The aortic valve is normal in structure. Aortic valve regurgitation is not visualized. No aortic stenosis is present. 5. The inferior vena cava is normal in size with greater than 50% respiratory variability, suggesting right atrial pressure of 3 mmHg.   Neuro/Psych  Headaches, PSYCHIATRIC DISORDERS Anxiety Depression    GI/Hepatic negative GI ROS, (+)     substance abuse  ,   Endo/Other  Hypothyroidism   Renal/GU negative Renal ROS     Musculoskeletal  (+) Arthritis , Fibromyalgia -, narcotic dependentLumbar post-laminectomy syndrome   Abdominal (+) + obese,   Peds  Hematology HLD   Anesthesia Other Findings LEFT URETEROPELVIC JUNCTION STONE WITH HYDRONEPHROSIS  Reproductive/Obstetrics                            Anesthesia Physical Anesthesia Plan  ASA: III  Anesthesia Plan: General   Post-op Pain Management:    Induction:  Intravenous  PONV Risk Score and Plan:   Airway Management Planned:   Additional Equipment:   Intra-op Plan:   Post-operative Plan: Extubation in OR  Informed Consent: I have reviewed the patients History and Physical, chart, labs and discussed the procedure including the risks, benefits and alternatives for the proposed anesthesia with the patient or authorized representative who has indicated his/her understanding and acceptance.     Dental advisory given  Plan Discussed with: CRNA  Anesthesia Plan Comments:         Anesthesia Quick Evaluation

## 2019-11-25 NOTE — Discharge Instructions (Addendum)
1. You may see some blood in the urine and may have some burning with urination for 48-72 hours. You also may notice that you have to urinate more frequently or urgently after your procedure which is normal.  2. You should call should you develop an inability urinate, fever > 101, persistent nausea and vomiting that prevents you from eating or drinking to stay hydrated.  3. If you have a stent, you will likely urinate more frequently and urgently until the stent is removed and you may experience some discomfort/pain in the lower abdomen and flank especially when urinating. You may take pain medication prescribed to you if needed for pain. You may also intermittently have blood in the urine until the stent is removed.              Remove stent by pulling the string on Monday morning  11/29/2019

## 2019-11-25 NOTE — Interval H&P Note (Signed)
History and Physical Interval Note:  11/25/2019 12:00 PM  Kimberly Larsen  has presented today for surgery, with the diagnosis of LEFT URETEROPELVIC JUNCTION STONE WITH HYDRONEPHROSIS.  The various methods of treatment have been discussed with the patient and family. After consideration of risks, benefits and other options for treatment, the patient has consented to  Procedure(s): CYSTOSCOPY/RETROGRADE/URETEROSCOPY/HOLMIUM LASER/STENT PLACEMENT (Left) as a surgical intervention.  The patient's history has been reviewed, patient examined, no change in status, stable for surgery.  I have reviewed the patient's chart and labs.  Questions were answered to the patient's satisfaction.     [employer]

## 2019-11-25 NOTE — Anesthesia Postprocedure Evaluation (Signed)
Anesthesia Post Note  Patient: Kimberly Larsen  Procedure(s) Performed: CYSTOSCOPY/RETROGRADE/URETEROSCOPY/HOLMIUM LASER/STENT PLACEMENT (Left )     Patient location during evaluation: PACU Anesthesia Type: General Level of consciousness: awake Pain management: pain level controlled Vital Signs Assessment: post-procedure vital signs reviewed and stable Respiratory status: spontaneous breathing, nonlabored ventilation, respiratory function stable and patient connected to nasal cannula oxygen Cardiovascular status: blood pressure returned to baseline and stable Postop Assessment: no apparent nausea or vomiting Anesthetic complications: no   No complications documented.  Last Vitals:  Vitals:   11/25/19 1400 11/25/19 1453  BP: (!) 152/67 (!) 155/89  Pulse: 65 63  Resp: (!) 11 18  Temp:    SpO2: 93% 93%    Last Pain:  Vitals:   11/25/19 1345  TempSrc:   PainSc: Asleep                 Sheamus Hasting P Jamirah Zelaya

## 2019-11-25 NOTE — Op Note (Signed)
Preoperative diagnosis: Left ureteral calculus  Postoperative diagnosis: Left ureteral calculus  Procedure:  1. Cystoscopy 2. Left ureteroscopy and stone removal 3. Ureteroscopic laser lithotripsy 4. Left ureteral stent placement (6 x 24 with string) 5. Left retrograde pyelography with interpretation  Surgeon: Pryor Curia. M.D.  Anesthesia: General  Complications: None  Intraoperative findings: Left retrograde pyelography demonstrated a filling defect within the proximal left ureter consistent with the patient's known calculus without other abnormalities.  EBL: Minimal  Specimens: 1. Left ureteral calculus  Disposition of specimens: [facility] for stone analysis  Indication: Kimberly Larsen is a 74 y.o. year old patient with urolithiasis and a symptomatic left ureteral 5 mm stone. After reviewing the management options for treatment, the patient elected to proceed with the above surgical procedure(s). We have discussed the potential benefits and risks of the procedure, side effects of the proposed treatment, the likelihood of the patient achieving the goals of the procedure, and any potential problems that might occur during the procedure or recuperation. Informed consent has been obtained.  Description of procedure:  The patient was taken to the operating room and general anesthesia was induced.  The patient was placed in the dorsal lithotomy position, prepped and draped in the usual sterile fashion, and preoperative antibiotics were administered. A preoperative time-out was performed.   Cystourethroscopy was performed.  The patient's urethra was examined and was normal. The bladder was then systematically examined in its entirety. There was no evidence for any bladder tumors, stones, or other mucosal pathology.    Attention then turned to the left ureteral orifice and a ureteral catheter was used to intubate the ureteral orifice.  Omnipaque contrast was  injected through the ureteral catheter and a retrograde pyelogram was performed with findings as dictated above.  A 0.38 sensor guidewire was then advanced up the left ureter into the renal pelvis under fluoroscopic guidance. The 6 Fr semirigid ureteroscope was then advanced into the ureter next to the guidewire and the calculus was identified.   The stone was then fragmented with the 200 micron holmium laser fiber on a setting of 0.6 J and frequency of 6 Hz.   All stones were then removed from the ureter with an N gauge basket.  Reinspection of the ureter revealed no remaining visible stones or fragments.   The wire was then backloaded through the cystoscope and a ureteral stent was advance over the wire using Seldinger technique.  The stent was positioned appropriately under fluoroscopic and cystoscopic guidance.  The wire was then removed with an adequate stent curl noted in the renal pelvis as well as in the bladder.  The bladder was then emptied and the procedure ended.  The patient appeared to tolerate the procedure well and without complications.  The patient was able to be awakened and transferred to the recovery unit in satisfactory condition.

## 2019-11-25 NOTE — Transfer of Care (Signed)
Immediate Anesthesia Transfer of Care Note  Patient: Kimberly Larsen  Procedure(s) Performed: CYSTOSCOPY/RETROGRADE/URETEROSCOPY/HOLMIUM LASER/STENT PLACEMENT (Left )  Patient Location: PACU  Anesthesia Type:General  Level of Consciousness: drowsy and patient cooperative  Airway & Oxygen Therapy: Patient Spontanous Breathing and Patient connected to face mask oxygen  Post-op Assessment: Report given to RN and Post -op Vital signs reviewed and stable  Post vital signs: Reviewed and stable  Last Vitals:  Vitals Value Taken Time  BP 158/77 11/25/19 1318  Temp    Pulse 69 11/25/19 1321  Resp 13 11/25/19 1321  SpO2 100 % 11/25/19 1321  Vitals shown include unvalidated device data.  Last Pain:  Vitals:   11/25/19 1059  TempSrc: Oral         Complications: No complications documented.

## 2019-11-25 NOTE — Anesthesia Procedure Notes (Signed)
Procedure Name: LMA Insertion Date/Time: 11/25/2019 12:32 PM Performed by: Eben Burow, CRNA Pre-anesthesia Checklist: Patient identified, Emergency Drugs available, Suction available, Patient being monitored and Timeout performed Patient Re-evaluated:Patient Re-evaluated prior to induction Oxygen Delivery Method: Circle system utilized Preoxygenation: Pre-oxygenation with 100% oxygen Induction Type: IV induction Ventilation: Mask ventilation without difficulty LMA: LMA inserted LMA Size: 4.0 Number of attempts: 1 Tube secured with: Tape Dental Injury: Teeth and Oropharynx as per pre-operative assessment

## 2019-11-26 ENCOUNTER — Encounter (HOSPITAL_COMMUNITY): Payer: Self-pay | Admitting: Urology

## 2019-11-29 DIAGNOSIS — S199XXA Unspecified injury of neck, initial encounter: Secondary | ICD-10-CM | POA: Diagnosis not present

## 2019-11-29 DIAGNOSIS — R52 Pain, unspecified: Secondary | ICD-10-CM | POA: Diagnosis not present

## 2019-11-29 DIAGNOSIS — I10 Essential (primary) hypertension: Secondary | ICD-10-CM | POA: Diagnosis not present

## 2019-11-29 DIAGNOSIS — M542 Cervicalgia: Secondary | ICD-10-CM | POA: Diagnosis not present

## 2019-11-29 DIAGNOSIS — E7800 Pure hypercholesterolemia, unspecified: Secondary | ICD-10-CM | POA: Diagnosis not present

## 2019-11-29 DIAGNOSIS — R0789 Other chest pain: Secondary | ICD-10-CM | POA: Diagnosis not present

## 2019-11-29 DIAGNOSIS — Z79891 Long term (current) use of opiate analgesic: Secondary | ICD-10-CM | POA: Diagnosis not present

## 2019-11-29 DIAGNOSIS — Z79899 Other long term (current) drug therapy: Secondary | ICD-10-CM | POA: Diagnosis not present

## 2019-11-29 DIAGNOSIS — S299XXA Unspecified injury of thorax, initial encounter: Secondary | ICD-10-CM | POA: Diagnosis not present

## 2019-11-29 DIAGNOSIS — W19XXXA Unspecified fall, initial encounter: Secondary | ICD-10-CM | POA: Diagnosis not present

## 2019-11-29 DIAGNOSIS — M1990 Unspecified osteoarthritis, unspecified site: Secondary | ICD-10-CM | POA: Diagnosis not present

## 2019-11-29 DIAGNOSIS — D352 Benign neoplasm of pituitary gland: Secondary | ICD-10-CM | POA: Diagnosis not present

## 2019-11-29 DIAGNOSIS — S0990XA Unspecified injury of head, initial encounter: Secondary | ICD-10-CM | POA: Diagnosis not present

## 2019-11-29 DIAGNOSIS — R296 Repeated falls: Secondary | ICD-10-CM | POA: Diagnosis not present

## 2019-11-29 DIAGNOSIS — R0600 Dyspnea, unspecified: Secondary | ICD-10-CM | POA: Diagnosis not present

## 2019-11-29 DIAGNOSIS — S20211A Contusion of right front wall of thorax, initial encounter: Secondary | ICD-10-CM | POA: Diagnosis not present

## 2019-12-01 DIAGNOSIS — M549 Dorsalgia, unspecified: Secondary | ICD-10-CM | POA: Diagnosis not present

## 2019-12-01 DIAGNOSIS — Q383 Other congenital malformations of tongue: Secondary | ICD-10-CM | POA: Diagnosis not present

## 2019-12-01 DIAGNOSIS — K117 Disturbances of salivary secretion: Secondary | ICD-10-CM | POA: Diagnosis not present

## 2019-12-03 ENCOUNTER — Emergency Department (HOSPITAL_COMMUNITY)
Admission: EM | Admit: 2019-12-03 | Discharge: 2019-12-04 | Disposition: A | Discharge status: Home / Self Care | Payer: Medicare Other | Attending: Emergency Medicine | Admitting: Emergency Medicine

## 2019-12-03 ENCOUNTER — Encounter (HOSPITAL_COMMUNITY): Payer: Self-pay

## 2019-12-03 ENCOUNTER — Other Ambulatory Visit: Payer: Self-pay

## 2019-12-03 DIAGNOSIS — Z96652 Presence of left artificial knee joint: Secondary | ICD-10-CM | POA: Diagnosis not present

## 2019-12-03 DIAGNOSIS — F419 Anxiety disorder, unspecified: Secondary | ICD-10-CM | POA: Insufficient documentation

## 2019-12-03 DIAGNOSIS — N3000 Acute cystitis without hematuria: Secondary | ICD-10-CM | POA: Insufficient documentation

## 2019-12-03 DIAGNOSIS — Z7982 Long term (current) use of aspirin: Secondary | ICD-10-CM | POA: Diagnosis not present

## 2019-12-03 DIAGNOSIS — R531 Weakness: Secondary | ICD-10-CM

## 2019-12-03 DIAGNOSIS — Z79899 Other long term (current) drug therapy: Secondary | ICD-10-CM | POA: Diagnosis not present

## 2019-12-03 DIAGNOSIS — I10 Essential (primary) hypertension: Secondary | ICD-10-CM | POA: Insufficient documentation

## 2019-12-03 DIAGNOSIS — M6281 Muscle weakness (generalized): Secondary | ICD-10-CM | POA: Diagnosis not present

## 2019-12-03 DIAGNOSIS — N200 Calculus of kidney: Secondary | ICD-10-CM | POA: Diagnosis not present

## 2019-12-03 DIAGNOSIS — E860 Dehydration: Secondary | ICD-10-CM | POA: Insufficient documentation

## 2019-12-03 DIAGNOSIS — R0902 Hypoxemia: Secondary | ICD-10-CM | POA: Diagnosis not present

## 2019-12-03 DIAGNOSIS — B9689 Other specified bacterial agents as the cause of diseases classified elsewhere: Secondary | ICD-10-CM | POA: Diagnosis not present

## 2019-12-03 LAB — URINALYSIS, ROUTINE W REFLEX MICROSCOPIC
Bilirubin Urine: NEGATIVE
Glucose, UA: NEGATIVE mg/dL
Hgb urine dipstick: NEGATIVE
Ketones, ur: NEGATIVE mg/dL
Nitrite: NEGATIVE
Protein, ur: 30 mg/dL — AB
Specific Gravity, Urine: 1.02 (ref 1.005–1.030)
WBC, UA: >50 WBC/hpf — ABNORMAL HIGH (ref 0–5)
pH: 8 (ref 5.0–8.0)

## 2019-12-03 LAB — BASIC METABOLIC PANEL
Anion gap: 12 (ref 5–15)
BUN: 17 mg/dL (ref 8–23)
CO2: 26 mmol/L (ref 22–32)
Calcium: 9.8 mg/dL (ref 8.9–10.3)
Chloride: 99 mmol/L (ref 98–111)
Creatinine, Ser: 1.09 mg/dL — ABNORMAL HIGH (ref 0.44–1.00)
GFR calc Af Amer: 59 mL/min — ABNORMAL LOW (ref >60)
GFR calc non Af Amer: 51 mL/min — ABNORMAL LOW (ref >60)
Glucose, Bld: 114 mg/dL — ABNORMAL HIGH (ref 70–99)
Potassium: 4.6 mmol/L (ref 3.5–5.1)
Sodium: 137 mmol/L (ref 135–145)

## 2019-12-03 LAB — CBC
HCT: 43.2 % (ref 36.0–46.0)
Hemoglobin: 13.8 g/dL (ref 12.0–15.0)
MCH: 31.6 pg (ref 26.0–34.0)
MCHC: 31.9 g/dL (ref 30.0–36.0)
MCV: 98.9 fL (ref 80.0–100.0)
Platelets: 340 10*3/uL (ref 150–400)
RBC: 4.37 MIL/uL (ref 3.87–5.11)
RDW: 13.2 % (ref 11.5–15.5)
WBC: 10.8 10*3/uL — ABNORMAL HIGH (ref 4.0–10.5)
nRBC: 0 % (ref 0.0–0.2)

## 2019-12-03 MED ORDER — SODIUM CHLORIDE 0.9% FLUSH
3.0000 mL | Freq: Once | INTRAVENOUS | Status: AC
  Administered 2019-12-04: 3 mL via INTRAVENOUS

## 2019-12-03 NOTE — ED Triage Notes (Addendum)
Pt arrives to ED w/ c/o generalized weakness and fatigue x 1 week. Pt states she has hx of frequent UTIs and this feels similar. Pt denies cough, fever, sob. Pt states she did have mechanical fall one week ago. Pt denies head trauma during that fall, denies blood thinners. EMS VSS.

## 2019-12-04 DIAGNOSIS — M6281 Muscle weakness (generalized): Secondary | ICD-10-CM | POA: Diagnosis not present

## 2019-12-04 LAB — CBG MONITORING, ED: Glucose-Capillary: 110 mg/dL — ABNORMAL HIGH (ref 70–99)

## 2019-12-04 MED ORDER — CEPHALEXIN 500 MG PO CAPS
500.0000 mg | ORAL_CAPSULE | Freq: Three times a day (TID) | ORAL | 0 refills | Status: DC
Start: 2019-12-04 — End: 2020-02-15

## 2019-12-04 MED ORDER — ALPRAZOLAM 0.25 MG PO TABS
0.5000 mg | ORAL_TABLET | Freq: Once | ORAL | Status: AC
  Administered 2019-12-04: 0.5 mg via ORAL
  Filled 2019-12-04: qty 2

## 2019-12-04 MED ORDER — LACTATED RINGERS IV BOLUS
1000.0000 mL | Freq: Once | INTRAVENOUS | Status: AC
  Administered 2019-12-04: 1000 mL via INTRAVENOUS

## 2019-12-04 NOTE — ED Provider Notes (Signed)
[facility] Provider Note   CSN: [id number] Arrival date & time: 12/03/19  1551     History Chief Complaint  Patient presents with  . Weakness    [facility] is a 74 y.o. female.  The history is provided by the patient.  Weakness Severity:  Moderate Onset quality:  Gradual Duration:  2 weeks Timing:  Intermittent Progression:  Worsening Chronicity:  New Relieved by:  None tried Worsened by:  Nothing Associated symptoms: falls   Associated symptoms: no abdominal pain, no chest pain, no cough, no dysuria, no fever, no headaches, no loss of consciousness, no stroke symptoms, no syncope and no vomiting   Risk factors: no new medications    Patient with history of hypertension, anxiety, kidney stones, frequent UTIs presents with generalized weakness.  Patient reports over the past 2 weeks she has had increasing generalized fatigue.  She reports decreased appetite and feeling dehydrated.  No chest pain or shortness of breath or cough.  No fevers or vomiting.  No focal weakness.  No syncope.  Patient suspect that she had a UTI.  Patient had ureteral stent placed on July 29 without difficulty.  She was instructed by her urologist to remove it on August 3. Denies any dysuria, denies incontinence, only has brief abdominal cramping. She does report recent falls.  She reports at least one of them was when she slipped off the bed.  She denies any syncope.  She was seen in outside hospital and reportedly had negative CT head.  No falls since that ER visit Patient reports recent constipation and she took meds for this and since then has had loose stool.    Past Medical History:  Diagnosis Date  . Acute postoperative respiratory failure ([place]) 03/25/2014  . AKI (acute kidney injury) ([place]) 03/25/2014  . Anxiety   . Aortic atherosclerosis ([place])   . Arthritis   . Back pain   . Benign essential HTN 03/25/2014  . Cancer [facility])    endometrial cancer  .  Chronic daily headache 10/23/2017  . Chronic low back pain 07/30/2017  . Chronic pain syndrome 02/23/2014  . DDD (degenerative disc disease), lumbar   . Degeneration of lumbar intervertebral disc 05/10/2019  . Depression   . Endometrial cancer ([place]) 01/10/2016  . Expected blood loss anemia 09/02/2012  . Fibromyalgia 09/08/2012  . Gastroparesis   . Genetic testing 12/17/2017   TumorNext [facility] + CancerNext was ordered. The CancerNext gene panel offered by [employer] includes sequencing and rearrangement analysis for the following 32 genes:   APC, ATM, BARD1, BMPR1A, BRCA1, BRCA2, BRIP1, CDH1, CDK4, CDKN2A, CHEK2, DICER1, EPCAM, GREM1, HOXB13MLH1, MRE11A, MSH2, MSH6, MUTYH, NBN, NF1, PALB2, PMS2, POLD1, POLE, PTEN, RAD50, RAD51D, SMAD4, SMARCA4, STK11, and TP53.   Ger  . History of kidney stones   . HLD (hyperlipidemia)   . Hx of sepsis 02/2014   DUE TO MULTIPLE KIDNEY STONES  . Hyperlipemia   . Hyperlipidemia 03/25/2014  . Hypertension   . HYPERTENSION, BENIGN SYSTEMIC 06/26/2006   Qualifier: Diagnosis of  By: Herma Ard    . Insomnia 09/08/2012  . Lactic acidosis 07/10/2017  . Long-term current use of opiate analgesic 05/28/2017  . Lumbar post-laminectomy syndrome 07/30/2017  . Near syncope 07/10/2017  . NEPHROLITHIASIS 06/26/2006   Qualifier: Diagnosis of  By: Herma Ard    . Obese 11/11/2018  . Obesity (BMI 30-39.9) 09/02/2012  . Obstructive uropathy 03/25/2014  . Osteoporosis   . Pain in left knee  01/08/2018  . Pneumonia    2014  . Prolactinoma ([place]) 07/16/2017  . S/P left TKA 11/10/2018  . S/P right TKA 08/31/2012  . Severe sepsis with septic shock ([place]) 03/25/2014  . UTI (urinary tract infection)     Patient Active Problem List   Diagnosis Date Noted  . Dyspnea on exertion 08/19/2019  . Degeneration of lumbar intervertebral disc 05/10/2019  . Obese 11/11/2018  . S/P left TKA 11/10/2018  . Pain in left knee 01/08/2018  . Genetic testing 12/17/2017  . Chronic daily  headache 10/23/2017  . Chronic low back pain 07/30/2017  . Lumbar post-laminectomy syndrome 07/30/2017  . Prolactinoma ([place]) 07/16/2017  . UTI (urinary tract infection) 07/10/2017  . Near syncope 07/10/2017  . Lactic acidosis 07/10/2017  . Long-term current use of opiate analgesic 05/28/2017  . Endometrial cancer ([place]) 01/10/2016  . Severe sepsis with septic shock ([place]) 03/25/2014  . AKI (acute kidney injury) ([place]) 03/25/2014  . Benign essential HTN 03/25/2014  . Obstructive uropathy 03/25/2014  . Hyperlipidemia 03/25/2014  . Acute postoperative respiratory failure ([place]) 03/25/2014  . Chronic pain syndrome 02/23/2014  . Fibromyalgia 09/08/2012  . Insomnia 09/08/2012  . Obesity (BMI 30-39.9) 09/02/2012  . Expected blood loss anemia 09/02/2012  . S/P right TKA 08/31/2012  . HYPERTENSION, BENIGN SYSTEMIC 06/26/2006  . NEPHROLITHIASIS 06/26/2006    Past Surgical History:  Procedure Laterality Date  . BACK SURGERY  2013  . CARPAL TUNNEL RELEASE     R hand  . CYSTOSCOPY WITH URETEROSCOPY AND STENT PLACEMENT Bilateral 03/25/2014   Procedure: CYSTOSCOPY WITH URETEROSCOPY AND STENT PLACEMENT;  Surgeon: Raynelle Bring, MD;  Location: [facility];  Service: Urology;  Laterality: Bilateral;  . CYSTOSCOPY WITH URETEROSCOPY AND STENT PLACEMENT Bilateral 04/18/2014   Procedure: CYSTOSCOPY WITH URETEROSCOPY AND STENT PLACEMENT,RETROGRADE;  Surgeon: Raynelle Bring, MD;  Location: [facility];  Service: Urology;  Laterality: Bilateral;  . CYSTOSCOPY WITH URETEROSCOPY AND STENT PLACEMENT Right 05/30/2014   Procedure: CYSTOSCOPY WITH URETEROSCOPY AND STENT PLACEMENT;  Surgeon: Raynelle Bring, MD;  Location: [facility];  Service: Urology;  Laterality: Right;  . CYSTOSCOPY/RETROGRADE/URETEROSCOPY Left 05/30/2014   Procedure: LEFT RETROGRADE;  Surgeon: Raynelle Bring, MD;  Location: [facility];  Service: Urology;  Laterality: Left;  . CYSTOSCOPY/URETEROSCOPY/HOLMIUM LASER/STENT PLACEMENT Left 11/25/2019   Procedure:  CYSTOSCOPY/RETROGRADE/URETEROSCOPY/HOLMIUM LASER/STENT PLACEMENT;  Surgeon: Raynelle Bring, MD;  Location: [facility];  Service: Urology;  Laterality: Left;  . EYE SURGERY     cataract surgery bil  . GANGLION CYST EXCISION     L ankle  . HAMMERTOE RECONSTRUCTION WITH WEIL OSTEOTOMY Left 09/05/2016   Procedure: Second Metatarsal Weil Osteotomy and Hammertoe Correction;  Surgeon: Wylene Simmer, MD;  Location: [place];  Service: Orthopedics;  Laterality: Left;  . HOLMIUM LASER APPLICATION Bilateral 67/89/3810   Procedure: HOLMIUM LASER APPLICATION;  Surgeon: Raynelle Bring, MD;  Location[facility];  Service: Urology;  Laterality: Bilateral;  . JOINT REPLACEMENT     total knee  . LITHOTRIPSY    . LUMBAR FUSION  09/2011  . METATARSAL OSTEOTOMY WITH BUNIONECTOMY Left 09/05/2016   Procedure: Left First Metatarsal Scarf Osteotomy, Modified McBride Bunion Correction;  Surgeon: Wylene Simmer, MD;  Location: [place];  Service: Orthopedics;  Laterality: Left;  . RHINOPLASTY    . ROBOTIC ASSISTED TOTAL HYSTERECTOMY WITH BILATERAL SALPINGO OOPHERECTOMY Bilateral 01/16/2016   Procedure: XI ROBOTIC ASSISTED TOTAL HYSTERECTOMY WITH BILATERAL SALPINGO OOPHORECTOMY AND BILATERAL PELVIC LYMPH NODE DISSECTION;  Surgeon: Everitt Amber, MD;  Location[facility];  Service: Gynecology;  Laterality: Bilateral;  . SPINAL CORD STIMULATOR INSERTION N/A 02/23/2014   Procedure: SPINAL CORD STIMULATOR PLACEMENT ;  Surgeon: Melina Schools, MD;  Location: [place];  Service: Orthopedics;  Laterality: N/A;  . TONSILLECTOMY    . TOTAL KNEE ARTHROPLASTY Right 08/31/2012   Procedure: RIGHT TOTAL KNEE ARTHROPLASTY;  Surgeon: Mauri Pole, MD;  [facility];  Service: Orthopedics;  Laterality: Right;  with LMA  . TOTAL KNEE ARTHROPLASTY Left 11/10/2018   Procedure: TOTAL KNEE ARTHROPLASTY;  Surgeon: Paralee Cancel, MD;  [facility];  Service: Orthopedics;  Laterality: Left;  70 mins  . TUBAL LIGATION        OB History   No obstetric history on file.     Family History  Problem Relation Age of Onset  . Stroke Mother   . Emphysema Mother   . COPD Mother   . Stroke Father   . Heart disease Father   . Emphysema Father     Social History   Tobacco Use  . Smoking status: Never Smoker  . Smokeless tobacco: Never Used  Vaping Use  . Vaping Use: Never used  Substance Use Topics  . Alcohol use: No  . Drug use: No    Home Medications Prior to Admission medications   Medication Sig Start Date End Date Taking? Authorizing Provider  ALPRAZolam (XANAX) 0.25 MG tablet Take 0.5 mg by mouth 3 (three) times daily as needed for anxiety.  05/30/17  Yes [provider]  amitriptyline (ELAVIL) 75 MG tablet TAKE 1 TABLET BY MOUTH AT BEDTIME. Patient taking differently: Take 75 mg by mouth at bedtime.  10/28/19  Yes Vaslow, Acey Lav, MD  aspirin 81 MG chewable tablet Chew 1 tablet by mouth daily.   Yes [provider]  cabergoline (DOSTINEX) 0.5 MG tablet Take 1 mg by mouth See admin instructions. Patient takes tablets 1 mg on Tuesday and friday   Yes [provider]  docusate sodium (COLACE) 50 MG capsule Take 50 mg by mouth at bedtime.    Yes [provider]  fluticasone (FLONASE) 50 MCG/ACT nasal spray Place 1 spray into both nostrils daily as needed for allergies.    Yes [provider]  levothyroxine (SYNTHROID) 25 MCG tablet Take 25 mcg by mouth daily before breakfast.  09/02/19  Yes [provider]  Melatonin 10 MG CAPS Take 10 mg by mouth at bedtime.   Yes [provider]  metoprolol succinate (TOPROL-XL) 25 MG 24 hr tablet Take 25 mg by mouth daily.  06/15/14  Yes [provider]  montelukast (SINGULAIR) 10 MG tablet Take 10 mg by mouth at bedtime.  10/06/17  Yes [provider]  Omega-3 Fatty Acids (FISH OIL) 1000 MG CAPS Take 1 capsule by mouth in the morning and at bedtime.    Yes [provider]   omeprazole (PRILOSEC) 20 MG capsule Take 1 capsule (20 mg total) by mouth daily. Patient taking differently: Take 20 mg by mouth at bedtime.  11/15/17  Yes Khatri, Hina, PA-C  pilocarpine (SALAGEN) 5 MG tablet Take 5 mg by mouth 4 (four) times daily. 10/28/19  Yes [provider]  polyethylene glycol (MIRALAX / GLYCOLAX) 17 g packet Take 17 g by mouth 2 (two) times daily. Patient taking differently: Take 17 g by mouth daily as needed for moderate constipation.  11/11/18  Yes Babish, Rodman Key, PA-C  pravastatin (PRAVACHOL) 40 MG tablet Take 1 tablet (40 mg total) by mouth at bedtime. 09/11/12  Yes Judeth Cornfield, NP  tamsulosin (FLOMAX) 0.4 MG CAPS capsule Take 0.4 mg by mouth at bedtime.   Yes [provider]  tiZANidine (ZANAFLEX) 2 MG tablet Take 1 tablet by mouth 3 (three) times daily as needed for muscle spasms.    Yes [provider]  traZODone (DESYREL) 100 MG tablet Take 150 mg by mouth at bedtime.    Yes [provider]  HYDROcodone-acetaminophen (NORCO/VICODIN) 5-325 MG tablet Take 1-2 tablets by mouth every 6 (six) hours as needed. Patient not taking: Reported on 12/04/2019 11/25/19   Raynelle Bring, MD  Multiple Vitamin (MULTIVITAMIN WITH MINERALS) TABS Take 1 tablet by mouth daily. Patient not taking: Reported on 11/22/2019 09/11/12   Judeth Cornfield, NP  ondansetron (ZOFRAN) 4 MG tablet Take 1 tablet (4 mg total) by mouth every 8 (eight) hours as needed for nausea or vomiting. Patient not taking: Reported on 12/04/2019 07/16/19   Mesner, Corene Cornea, MD  phenazopyridine (PYRIDIUM) 200 MG tablet Take 1 tablet (200 mg total) by mouth 3 (three) times daily as needed for pain. Patient not taking: Reported on 12/04/2019 11/25/19   Raynelle Bring, MD    Allergies    Augmentin [amoxicillin-pot clavulanate] and Wellbutrin [bupropion]  Review of Systems   Review of Systems  Constitutional: Positive for appetite change and fatigue. Negative for fever.  Respiratory: Negative  for cough.   Cardiovascular: Negative for chest pain and syncope.  Gastrointestinal: Negative for abdominal pain and vomiting.  Genitourinary: Negative for dysuria.  Musculoskeletal: Positive for falls. Negative for back pain and neck pain.  Neurological: Positive for weakness. Negative for loss of consciousness, syncope and headaches.  Psychiatric/Behavioral: The patient is nervous/anxious.   All other systems reviewed and are negative.   Physical Exam Updated Vital Signs BP 133/68 (BP Location: Right Arm)   Pulse 88   Temp 98.2 F (36.8 C) (Oral)   Resp 14   Ht 1.549 m (5' 1" )   Wt 90 kg   SpO2 96%   BMI 37.48 kg/m   Physical Exam CONSTITUTIONAL: elderly, mildly anxious HEAD: Normocephalic/atraumatic EYES: EOMI/PERRL, no nystagmus,no ptosis ENMT: Mucous membranes dry NECK: supple no meningeal signs CV: S1/S2 noted, no murmurs/rubs/gallops noted LUNGS: Lungs are clear to auscultation bilaterally, no apparent distress ABDOMEN: soft, nontender, no rebound or guarding GU:no cva tenderness NEURO:Awake/alert, face symmetric, no arm or leg drift is noted Equal 5/5 strength with shoulder abduction, elbow flex/extension, wrist flex/extension in upper extremities and equal hand grips bilaterally Equal 5/5 strength with hip flexion,knee flex/extension, foot dorsi/plantar flexion Cranial nerves 3/4/5/6/11/04/08/11/12 tested and intact Sensation to light touch intact in all extremities EXTREMITIES: pulses normal, full ROM, no deformities SKIN: warm, color normal PSYCH: no abnormalities of mood noted   ED Results / Procedures / Treatments   Labs (all labs ordered are listed, but only abnormal results are displayed) Labs Reviewed  BASIC METABOLIC PANEL - Abnormal; Notable for the following components:      Result Value   Glucose, Bld 114 (*)    Creatinine, Ser 1.09 (*)    GFR calc non Af Amer 51 (*)    GFR calc Af Amer 59 (*)    All other components within normal limits  CBC -  Abnormal; Notable for the following components:   WBC 10.8 (*)    All other components within normal limits  URINALYSIS, ROUTINE W REFLEX MICROSCOPIC - Abnormal; Notable for the following components:   Color, Urine AMBER (*)    APPearance HAZY (*)  Protein, ur 30 (*)    Leukocytes,Ua MODERATE (*)    WBC, UA >50 (*)    Bacteria, UA RARE (*)    Non Squamous Epithelial 0-5 (*)    All other components within normal limits  CBG MONITORING, ED - Abnormal; Notable for the following components:   Glucose-Capillary 110 (*)    All other components within normal limits  URINE CULTURE    EKG EKG Interpretation  Date/Time:  Friday December 03 2019 16:03:46 EDT Ventricular Rate:  92 PR Interval:  166 QRS Duration: 90 QT Interval:  348 QTC Calculation: 430 R Axis:   52 Text Interpretation: Normal sinus rhythm Anterior infarct , age undetermined Abnormal ECG No significant change since last tracing Interpretation limited secondary to artifact Confirmed by Ripley Fraise [phone]) on 12/04/2019 12:34:15 AM   Radiology No results found.  Procedures Procedures   Medications Ordered in ED Medications  sodium chloride flush (NS) 0.9 % injection 3 mL (3 mLs Intravenous Given 12/04/19 0037)  lactated ringers bolus 1,000 mL (0 mLs Intravenous Stopped 12/04/19 0248)  ALPRAZolam Duanne Moron) tablet 0.5 mg (0.5 mg Oral Given 12/04/19 0108)    ED Course  I have reviewed the triage vital signs and the nursing notes.  Pertinent labs  results that were available during my care of the patient were reviewed by me and considered in my medical decision making (see chart for details).    MDM Rules/Calculators/A&P                         2:51 AM Patient presented with generalized weakness and feeling dehydrated.  No focal weakness noted.  Patient felt that she might have a UTI, and given urine findings will start on antibiotics.  She had a recent ureteral stent but that has been removed.  She is not septic  appearing.  No signs of a stroke.  Patient has now improved and ambulatory. She feels comfortable for discharge home. Final Clinical Impression(s) / ED Diagnoses Final diagnoses:  Weakness  Dehydration  Acute cystitis without hematuria    Rx / DC Orders ED Discharge Orders         Ordered    cephALEXin (KEFLEX) 500 MG capsule  3 times daily     Discontinue  Reprint     12/04/19 0243           Ripley Fraise, MD 12/04/19 [phone]

## 2019-12-04 NOTE — Discharge Instructions (Addendum)

## 2019-12-05 LAB — URINE CULTURE

## 2019-12-06 DIAGNOSIS — M5417 Radiculopathy, lumbosacral region: Secondary | ICD-10-CM | POA: Diagnosis not present

## 2019-12-06 DIAGNOSIS — M961 Postlaminectomy syndrome, not elsewhere classified: Secondary | ICD-10-CM | POA: Diagnosis not present

## 2019-12-08 DIAGNOSIS — N202 Calculus of kidney with calculus of ureter: Secondary | ICD-10-CM | POA: Diagnosis not present

## 2019-12-22 ENCOUNTER — Ambulatory Visit: Payer: Medicare Other | Admitting: Neurology

## 2019-12-23 ENCOUNTER — Ambulatory Visit: Payer: Medicare Other | Admitting: Neurology

## 2019-12-28 DIAGNOSIS — M5136 Other intervertebral disc degeneration, lumbar region: Secondary | ICD-10-CM | POA: Diagnosis not present

## 2020-01-11 DIAGNOSIS — Z79899 Other long term (current) drug therapy: Secondary | ICD-10-CM | POA: Diagnosis not present

## 2020-01-11 DIAGNOSIS — K117 Disturbances of salivary secretion: Secondary | ICD-10-CM | POA: Diagnosis not present

## 2020-01-11 DIAGNOSIS — F329 Major depressive disorder, single episode, unspecified: Secondary | ICD-10-CM | POA: Diagnosis not present

## 2020-01-17 DIAGNOSIS — B351 Tinea unguium: Secondary | ICD-10-CM | POA: Diagnosis not present

## 2020-01-19 DIAGNOSIS — N3000 Acute cystitis without hematuria: Secondary | ICD-10-CM | POA: Diagnosis not present

## 2020-01-19 DIAGNOSIS — R8271 Bacteriuria: Secondary | ICD-10-CM | POA: Diagnosis not present

## 2020-01-24 DIAGNOSIS — M5136 Other intervertebral disc degeneration, lumbar region: Secondary | ICD-10-CM | POA: Diagnosis not present

## 2020-01-24 DIAGNOSIS — M545 Low back pain: Secondary | ICD-10-CM | POA: Diagnosis not present

## 2020-01-24 DIAGNOSIS — D352 Benign neoplasm of pituitary gland: Secondary | ICD-10-CM | POA: Diagnosis not present

## 2020-01-25 DIAGNOSIS — Z23 Encounter for immunization: Secondary | ICD-10-CM | POA: Diagnosis not present

## 2020-02-01 DIAGNOSIS — M545 Low back pain, unspecified: Secondary | ICD-10-CM | POA: Diagnosis not present

## 2020-02-08 DIAGNOSIS — R293 Abnormal posture: Secondary | ICD-10-CM | POA: Diagnosis not present

## 2020-02-08 DIAGNOSIS — M6281 Muscle weakness (generalized): Secondary | ICD-10-CM | POA: Diagnosis not present

## 2020-02-08 DIAGNOSIS — M545 Low back pain, unspecified: Secondary | ICD-10-CM | POA: Diagnosis not present

## 2020-02-09 DIAGNOSIS — M533 Sacrococcygeal disorders, not elsewhere classified: Secondary | ICD-10-CM | POA: Diagnosis not present

## 2020-02-10 ENCOUNTER — Encounter: Payer: Self-pay | Admitting: *Deleted

## 2020-02-10 DIAGNOSIS — R293 Abnormal posture: Secondary | ICD-10-CM | POA: Diagnosis not present

## 2020-02-10 DIAGNOSIS — M545 Low back pain, unspecified: Secondary | ICD-10-CM | POA: Diagnosis not present

## 2020-02-10 DIAGNOSIS — M6281 Muscle weakness (generalized): Secondary | ICD-10-CM | POA: Diagnosis not present

## 2020-02-14 DIAGNOSIS — M545 Low back pain, unspecified: Secondary | ICD-10-CM | POA: Diagnosis not present

## 2020-02-14 DIAGNOSIS — R293 Abnormal posture: Secondary | ICD-10-CM | POA: Diagnosis not present

## 2020-02-14 DIAGNOSIS — M6281 Muscle weakness (generalized): Secondary | ICD-10-CM | POA: Diagnosis not present

## 2020-02-15 ENCOUNTER — Other Ambulatory Visit: Payer: Self-pay | Admitting: Neurology

## 2020-02-15 ENCOUNTER — Ambulatory Visit (INDEPENDENT_AMBULATORY_CARE_PROVIDER_SITE_OTHER): Payer: Medicare Other | Admitting: Diagnostic Neuroimaging

## 2020-02-15 ENCOUNTER — Ambulatory Visit: Payer: Medicare Other | Admitting: Diagnostic Neuroimaging

## 2020-02-15 ENCOUNTER — Encounter: Payer: Self-pay | Admitting: Diagnostic Neuroimaging

## 2020-02-15 VITALS — BP 119/72 | HR 77 | Ht 62.0 in | Wt 188.6 lb

## 2020-02-15 DIAGNOSIS — R6889 Other general symptoms and signs: Secondary | ICD-10-CM

## 2020-02-15 DIAGNOSIS — Z79899 Other long term (current) drug therapy: Secondary | ICD-10-CM | POA: Diagnosis not present

## 2020-02-15 DIAGNOSIS — G629 Polyneuropathy, unspecified: Secondary | ICD-10-CM

## 2020-02-15 NOTE — Patient Instructions (Signed)
  BILATERAL FOOT NUMBNESS - check neuropathy labs

## 2020-02-15 NOTE — Progress Notes (Signed)
[facility]  PATIENT: Kimberly Larsen DOB: 15-Jan-1946  REFERRING CLINICIAN: Suella Broad, MD HISTORY FROM: patient  REASON FOR VISIT: new consult   HISTORICAL  CHIEF COMPLAINT:  Chief Complaint  Patient presents with  . Numbness of Foot    rm 6 New Pt "both feet but left more than right are numb"     HISTORY OF PRESENT ILLNESS:   74 year old female here for evaluation of lower extremity numbness. Patient has noticed numbness in her feet for past 6 to 7 months. Patient has history of lumbar spine disease status post surgeries and pain management. No significant pain. Her balance is slightly off. No problems with fingers or hands.   REVIEW OF SYSTEMS: Full 14 system review of systems performed and negative with exception of: As per HPI.  ALLERGIES: Allergies  Allergen Reactions  . Augmentin [Amoxicillin-Pot Clavulanate] Nausea Only  . Wellbutrin [Bupropion] Nausea Only    HOME MEDICATIONS: Outpatient Medications Prior to Visit  Medication Sig Dispense Refill  . ALPRAZolam (XANAX) 0.25 MG tablet Take 0.5 mg by mouth 3 (three) times daily as needed for anxiety.     Marland Kitchen amitriptyline (ELAVIL) 75 MG tablet TAKE 1 TABLET BY MOUTH AT BEDTIME. (Patient taking differently: Take 75 mg by mouth at bedtime. ) 90 tablet 2  . aspirin 81 MG chewable tablet Chew 1 tablet by mouth daily.    . cabergoline (DOSTINEX) 0.5 MG tablet Take 1 mg by mouth See admin instructions. Patient takes tablets 1 mg on Tuesday and friday    . docusate sodium (COLACE) 50 MG capsule Take 50 mg by mouth at bedtime.     . fluticasone (FLONASE) 50 MCG/ACT nasal spray Place 1 spray into both nostrils daily as needed for allergies.     Marland Kitchen levothyroxine (SYNTHROID) 25 MCG tablet Take 25 mcg by mouth daily before breakfast.     . Melatonin 10 MG CAPS Take 10 mg by mouth at bedtime.    . metoprolol succinate (TOPROL-XL) 25 MG 24 hr tablet Take 25 mg by mouth daily.   0  . montelukast (SINGULAIR) 10  MG tablet Take 10 mg by mouth at bedtime.   1  . Multiple Vitamin (MULTIVITAMIN WITH MINERALS) TABS Take 1 tablet by mouth daily.    . Omega-3 Fatty Acids (FISH OIL) 1000 MG CAPS Take 1 capsule by mouth in the morning and at bedtime.     Marland Kitchen omeprazole (PRILOSEC) 20 MG capsule Take 1 capsule (20 mg total) by mouth daily. (Patient taking differently: Take 20 mg by mouth at bedtime. ) 30 capsule 0  . pilocarpine (SALAGEN) 5 MG tablet Take 5 mg by mouth 4 (four) times daily.    . polyethylene glycol (MIRALAX / GLYCOLAX) 17 g packet Take 17 g by mouth 2 (two) times daily. (Patient taking differently: Take 17 g by mouth daily as needed for moderate constipation. ) 28 packet 0  . pravastatin (PRAVACHOL) 40 MG tablet Take 1 tablet (40 mg total) by mouth at bedtime. 30 tablet 0  . terbinafine (LAMISIL) 250 MG tablet Take 250 mg by mouth daily.    Marland Kitchen tiZANidine (ZANAFLEX) 2 MG tablet Take 1 tablet by mouth 3 (three) times daily as needed for muscle spasms.     . traZODone (DESYREL) 100 MG tablet Take 150 mg by mouth at bedtime.     . tamsulosin (FLOMAX) 0.4 MG CAPS capsule Take 0.4 mg by mouth at bedtime. (Patient not taking: Reported on 02/15/2020)    .  cephALEXin (KEFLEX) 500 MG capsule Take 1 capsule (500 mg total) by mouth 3 (three) times daily. 21 capsule 0   No facility-administered medications prior to visit.    PAST MEDICAL HISTORY: Past Medical History:  Diagnosis Date  . Acute postoperative respiratory failure ([place]) 03/25/2014  . AKI (acute kidney injury) ([place]) 03/25/2014  . Anesthesia of skin   . Anxiety   . Aortic atherosclerosis ([place])   . Arthritis   . Back pain   . Benign essential HTN 03/25/2014  . Cancer [facility])    endometrial cancer  . Chronic daily headache 10/23/2017  . Chronic low back pain 07/30/2017  . Chronic pain syndrome 02/23/2014  . DDD (degenerative disc disease), lumbar   . Degeneration of lumbar intervertebral disc 05/10/2019  . Depression   . Endometrial cancer ([place])  01/10/2016  . Expected blood loss anemia 09/02/2012  . Fibromyalgia 09/08/2012  . Gastroparesis   . Genetic testing 12/17/2017   TumorNext [facility] + CancerNext was ordered. The CancerNext gene panel offered by [employer] includes sequencing and rearrangement analysis for the following 32 genes:   APC, ATM, BARD1, BMPR1A, BRCA1, BRCA2, BRIP1, CDH1, CDK4, CDKN2A, CHEK2, DICER1, EPCAM, GREM1, HOXB13MLH1, MRE11A, MSH2, MSH6, MUTYH, NBN, NF1, PALB2, PMS2, POLD1, POLE, PTEN, RAD50, RAD51D, SMAD4, SMARCA4, STK11, and TP53.   Ger  . History of kidney stones   . HLD (hyperlipidemia)   . Hx of sepsis 02/2014   DUE TO MULTIPLE KIDNEY STONES  . Hyperlipemia   . Hyperlipidemia 03/25/2014  . Hypertension   . HYPERTENSION, BENIGN SYSTEMIC 06/26/2006   Qualifier: Diagnosis of  By: Herma Ard    . Insomnia 09/08/2012  . Lactic acidosis 07/10/2017  . Long-term current use of opiate analgesic 05/28/2017  . Lumbar post-laminectomy syndrome 07/30/2017  . Near syncope 07/10/2017  . NEPHROLITHIASIS 06/26/2006   Qualifier: Diagnosis of  By: Herma Ard    . Obese 11/11/2018  . Obesity (BMI 30-39.9) 09/02/2012  . Obstructive uropathy 03/25/2014  . Osteoporosis   . Pain in left knee 01/08/2018  . Pneumonia    2014  . Prolactinoma ([place]) 07/16/2017  . S/P left TKA 11/10/2018  . S/P right TKA 08/31/2012  . Severe sepsis with septic shock ([place]) 03/25/2014  . UTI (urinary tract infection)     PAST SURGICAL HISTORY: Past Surgical History:  Procedure Laterality Date  . BACK SURGERY  2013  . CARPAL TUNNEL RELEASE     R hand  . CYSTOSCOPY WITH URETEROSCOPY AND STENT PLACEMENT Bilateral 03/25/2014   Procedure: CYSTOSCOPY WITH URETEROSCOPY AND STENT PLACEMENT;  Surgeon: Raynelle Bring, MD;  Location: [facility];  Service: Urology;  Laterality: Bilateral;  . CYSTOSCOPY WITH URETEROSCOPY AND STENT PLACEMENT Bilateral 04/18/2014   Procedure: CYSTOSCOPY WITH URETEROSCOPY AND STENT PLACEMENT,RETROGRADE;  Surgeon: Raynelle Bring, MD;  Location: [facility];  Service: Urology;  Laterality: Bilateral;  . CYSTOSCOPY WITH URETEROSCOPY AND STENT PLACEMENT Right 05/30/2014   Procedure: CYSTOSCOPY WITH URETEROSCOPY AND STENT PLACEMENT;  Surgeon: Raynelle Bring, MD;  Location: [facility];  Service: Urology;  Laterality: Right;  . CYSTOSCOPY/RETROGRADE/URETEROSCOPY Left 05/30/2014   Procedure: LEFT RETROGRADE;  Surgeon: Raynelle Bring, MD;  Location: [facility];  Service: Urology;  Laterality: Left;  . CYSTOSCOPY/URETEROSCOPY/HOLMIUM LASER/STENT PLACEMENT Left 11/25/2019   Procedure: CYSTOSCOPY/RETROGRADE/URETEROSCOPY/HOLMIUM LASER/STENT PLACEMENT;  Surgeon: Raynelle Bring, MD;  Location: [facility];  Service: Urology;  Laterality: Left;  . EYE SURGERY     cataract surgery bil  . GANGLION CYST EXCISION     L ankle  .  HAMMERTOE RECONSTRUCTION WITH WEIL OSTEOTOMY Left 09/05/2016   Procedure: Second Metatarsal Weil Osteotomy and Hammertoe Correction;  Surgeon: Wylene Simmer, MD;  Location: [facility];  Service: Orthopedics;  Laterality: Left;  . HOLMIUM LASER APPLICATION Bilateral 41/32/4401   Procedure: HOLMIUM LASER APPLICATION;  Surgeon: Raynelle Bring, MD;  Location: [facility];  Service: Urology;  Laterality: Bilateral;  . JOINT REPLACEMENT     total knee  . LITHOTRIPSY     x 3  . LUMBAR FUSION  09/2011  . METATARSAL OSTEOTOMY WITH BUNIONECTOMY Left 09/05/2016   Procedure: Left First Metatarsal Scarf Osteotomy, Modified McBride Bunion Correction;  Surgeon: Wylene Simmer, MD;  Location: [facility];  Service: Orthopedics;  Laterality: Left;  . RHINOPLASTY    . ROBOTIC ASSISTED TOTAL HYSTERECTOMY WITH BILATERAL SALPINGO OOPHERECTOMY Bilateral 01/16/2016   Procedure: XI ROBOTIC ASSISTED TOTAL HYSTERECTOMY WITH BILATERAL SALPINGO OOPHORECTOMY AND BILATERAL PELVIC LYMPH NODE DISSECTION;  Surgeon: Everitt Amber, MD;  Location: [facility];  Service: Gynecology;  Laterality: Bilateral;  . SPINAL CORD STIMULATOR INSERTION N/A 02/23/2014    Procedure: SPINAL CORD STIMULATOR PLACEMENT ;  Surgeon: Melina Schools, MD;  Location: [facility];  Service: Orthopedics;  Laterality: N/A;  . TONSILLECTOMY    . TOTAL KNEE ARTHROPLASTY Right 08/31/2012   Procedure: RIGHT TOTAL KNEE ARTHROPLASTY;  Surgeon: Mauri Pole, MD;  Location[facility];  Service: Orthopedics;  Laterality: Right;  with LMA  . TOTAL KNEE ARTHROPLASTY Left 11/10/2018   Procedure: TOTAL KNEE ARTHROPLASTY;  Surgeon: Paralee Cancel, MD;  Location: [facility];  Service: Orthopedics;  Laterality: Left;  70 mins  . TUBAL LIGATION      FAMILY HISTORY: Family History  Problem Relation Age of Onset  . Stroke Mother   . Emphysema Mother   . COPD Mother   . Hypertension Mother   . Stroke Father   . Heart disease Father   . Emphysema Father   . Hypertension Father   . Heart disease Brother     SOCIAL HISTORY: Social History   Socioeconomic History  . Marital status: Divorced    Spouse name: Not on file  . Number of children: 1  . Years of education: 76  . Highest education level: Not on file  Occupational History  . Not on file  Tobacco Use  . Smoking status: Never Smoker  . Smokeless tobacco: Never Used  Vaping Use  . Vaping Use: Never used  Substance and Sexual Activity  . Alcohol use: No  . Drug use: No  . Sexual activity: Yes  Other Topics Concern  . Not on file  Social History Narrative   Lives alone   Social Determinants of Health   Financial Resource Strain:   . Difficulty of Paying Living Expenses: Not on file  Food Insecurity:   . Worried About Charity fundraiser in the Last Year: Not on file  . Ran Out of Food in the Last Year: Not on file  Transportation Needs:   . Lack of Transportation (Medical): Not on file  . Lack of Transportation (Non-Medical): Not on file  Physical Activity:   . Days of Exercise per Week: Not on file  . Minutes of Exercise per Session: Not on file  Stress:   . Feeling of Stress : Not on file  Social Connections:   .  Frequency of Communication with Friends and Family: Not on file  . Frequency of Social Gatherings with Friends and Family: Not on file  . Attends Religious  Services: Not on file  . Active Member of Clubs or Organizations: Not on file  . Attends Archivist Meetings: Not on file  . Marital Status: Not on file  Intimate Partner Violence:   . Fear of Current or Ex-Partner: Not on file  . Emotionally Abused: Not on file  . Physically Abused: Not on file  . Sexually Abused: Not on file     PHYSICAL EXAM  GENERAL EXAM/CONSTITUTIONAL: Vitals:  Vitals:   02/15/20 1444  BP: 119/72  Pulse: 77  Weight: 188 lb 9.6 oz (85.5 kg)  Height: 5' 2" (1.575 m)     Body mass index is 34.5 kg/m. Wt Readings from Last 3 Encounters:  02/15/20 188 lb 9.6 oz (85.5 kg)  12/04/19 198 lb 6 oz (90 kg)  11/22/19 198 lb 6 oz (90 kg)     Patient is in no distress; well developed, nourished and groomed; neck is supple  CARDIOVASCULAR:  Examination of carotid arteries is normal; no carotid bruits  Regular rate and rhythm, no murmurs  Examination of peripheral vascular system by observation and palpation is normal  EYES:  Ophthalmoscopic exam of optic discs and posterior segments is normal; no papilledema or hemorrhages  No exam data present  MUSCULOSKELETAL:  Gait, strength, tone, movements noted in Neurologic exam below  NEUROLOGIC: MENTAL STATUS:  No flowsheet data found.  awake, alert, oriented to person, place and time  recent and remote memory intact  normal attention and concentration  language fluent, comprehension intact, naming intact  fund of knowledge appropriate  CRANIAL NERVE:   2nd - no papilledema on fundoscopic exam  2nd, 3rd, 4th, 6th - pupils equal and reactive to light, visual fields full to confrontation, extraocular muscles intact, no nystagmus  5th - facial sensation symmetric  7th - facial strength symmetric  8th - hearing intact  9th -  palate elevates symmetrically, uvula midline  11th - shoulder shrug symmetric  12th - tongue protrusion midline  MOTOR:   normal bulk and tone, full strength in the BUE, BLE  SENSORY:   normal and symmetric to light touch, pinprick, temperature, vibration; EXCEPT DECR IN BOTTOM OF FEET  COORDINATION:   finger-nose-finger, fine finger movements normal  REFLEXES:   deep tendon reflexes TRACE and symmetric; ABSENT AT ANKLES  GAIT/STATION:   narrow based gait; USING WALKER     DIAGNOSTIC DATA (LABS, IMAGING, TESTING) - I reviewed patient records, labs, notes, testing and imaging myself where available.  Lab Results  Component Value Date   WBC 10.8 (H) 12/03/2019   HGB 13.8 12/03/2019   HCT 43.2 12/03/2019   MCV 98.9 12/03/2019   PLT 340 12/03/2019      Component Value Date/Time   NA 137 12/03/2019 1603   NA 141 01/10/2016 1412   K 4.6 12/03/2019 1603   K 4.0 01/10/2016 1412   CL 99 12/03/2019 1603   CO2 26 12/03/2019 1603   CO2 30 (H) 01/10/2016 1412   GLUCOSE 114 (H) 12/03/2019 1603   GLUCOSE 102 01/10/2016 1412   BUN 17 12/03/2019 1603   BUN 19.3 01/10/2016 1412   CREATININE 1.09 (H) 12/03/2019 1603   CREATININE 0.91 10/01/2019 1132   CREATININE 0.9 01/10/2016 1412   CALCIUM 9.8 12/03/2019 1603   CALCIUM 9.8 01/10/2016 1412   PROT 6.6 10/01/2019 1132   ALBUMIN 3.7 10/01/2019 1132   AST 18 10/01/2019 1132   ALT 21 10/01/2019 1132   ALKPHOS 97 10/01/2019 1132   BILITOT 0.7 10/01/2019  [place] (L) 12/03/2019 1603   GFRNONAA >60 10/01/2019 1132   GFRAA 59 (L) 12/03/2019 1603   GFRAA >60 10/01/2019 1132   No results found for: CHOL, HDL, LDLCALC, LDLDIRECT, TRIG, CHOLHDL Lab Results  Component Value Date   HGBA1C 6.2 (H) 03/26/2014   No results found for: [id number] Lab Results  Component Value Date   TSH 0.675 10/09/2018       ASSESSMENT AND PLAN  74 y.o. year old female here with lower extremity numbness and tingling with signs  of the concerns with peripheral neuropathy. Also could be related to lumbar radiculopathies. We will proceed with further work-up.  Dx:  1. Neuropathy   2. Long-term use of high-risk medication   3. Other general symptoms and signs      PLAN:  BILATERAL FOOT NUMBNESS - check neuropathy labs  Orders Placed This Encounter  Procedures  . Vitamin B12  . A1c  . TSH  . SPEP with IFE  . ANA w/Reflex  . SSA, SSB   Return for pending if symptoms worsen or fail to improve.    Penni Bombard, MD 45/80/9983, 3:82 PM Certified in Neurology, Neurophysiology and Neuroimaging  [facility] [address] [place], [place] [address] [phone]

## 2020-02-16 DIAGNOSIS — N3000 Acute cystitis without hematuria: Secondary | ICD-10-CM | POA: Diagnosis not present

## 2020-02-17 DIAGNOSIS — M6281 Muscle weakness (generalized): Secondary | ICD-10-CM | POA: Diagnosis not present

## 2020-02-17 DIAGNOSIS — R293 Abnormal posture: Secondary | ICD-10-CM | POA: Diagnosis not present

## 2020-02-17 DIAGNOSIS — M545 Low back pain, unspecified: Secondary | ICD-10-CM | POA: Diagnosis not present

## 2020-02-17 LAB — MULTIPLE MYELOMA PANEL, SERUM
Albumin SerPl Elph-Mcnc: 3.5 g/dL (ref 2.9–4.4)
Albumin/Glob SerPl: 1.3 (ref 0.7–1.7)
Alpha 1: 0.2 g/dL (ref 0.0–0.4)
Alpha2 Glob SerPl Elph-Mcnc: 0.8 g/dL (ref 0.4–1.0)
B-Globulin SerPl Elph-Mcnc: 1.1 g/dL (ref 0.7–1.3)
Gamma Glob SerPl Elph-Mcnc: 0.5 g/dL (ref 0.4–1.8)
Globulin, Total: 2.7 g/dL (ref 2.2–3.9)
IgA/Immunoglobulin A, Serum: 181 mg/dL (ref 64–422)
IgG (Immunoglobin G), Serum: 571 mg/dL — ABNORMAL LOW (ref 586–1602)
IgM (Immunoglobulin M), Srm: 63 mg/dL (ref 26–217)
Total Protein: 6.2 g/dL (ref 6.0–8.5)

## 2020-02-17 LAB — ANA W/REFLEX: ANA Titer 1: NEGATIVE

## 2020-02-17 LAB — HEMOGLOBIN A1C
Est. average glucose Bld gHb Est-mCnc: 111 mg/dL
Hgb A1c MFr Bld: 5.5 % (ref 4.8–5.6)

## 2020-02-17 LAB — SJOGREN'S SYNDROME ANTIBODS(SSA + SSB)
ENA SSA (RO) Ab: <0.2 AI (ref 0.0–0.9)
ENA SSB (LA) Ab: <0.2 AI (ref 0.0–0.9)

## 2020-02-17 LAB — VITAMIN B12: Vitamin B-12: 798 pg/mL (ref 232–1245)

## 2020-02-17 LAB — TSH: TSH: 0.773 u[IU]/mL (ref 0.450–4.500)

## 2020-02-21 DIAGNOSIS — M545 Low back pain, unspecified: Secondary | ICD-10-CM | POA: Diagnosis not present

## 2020-02-21 DIAGNOSIS — M6281 Muscle weakness (generalized): Secondary | ICD-10-CM | POA: Diagnosis not present

## 2020-02-21 DIAGNOSIS — R293 Abnormal posture: Secondary | ICD-10-CM | POA: Diagnosis not present

## 2020-02-23 ENCOUNTER — Encounter: Payer: Self-pay | Admitting: *Deleted

## 2020-02-23 ENCOUNTER — Telehealth: Payer: Self-pay | Admitting: *Deleted

## 2020-02-23 NOTE — Telephone Encounter (Signed)
error 

## 2020-02-24 DIAGNOSIS — M545 Low back pain, unspecified: Secondary | ICD-10-CM | POA: Diagnosis not present

## 2020-02-24 DIAGNOSIS — M6281 Muscle weakness (generalized): Secondary | ICD-10-CM | POA: Diagnosis not present

## 2020-02-24 DIAGNOSIS — R293 Abnormal posture: Secondary | ICD-10-CM | POA: Diagnosis not present

## 2020-02-29 DIAGNOSIS — M545 Low back pain, unspecified: Secondary | ICD-10-CM | POA: Diagnosis not present

## 2020-02-29 DIAGNOSIS — M6281 Muscle weakness (generalized): Secondary | ICD-10-CM | POA: Diagnosis not present

## 2020-02-29 DIAGNOSIS — R293 Abnormal posture: Secondary | ICD-10-CM | POA: Diagnosis not present

## 2020-03-02 DIAGNOSIS — M545 Low back pain, unspecified: Secondary | ICD-10-CM | POA: Diagnosis not present

## 2020-03-02 DIAGNOSIS — M6281 Muscle weakness (generalized): Secondary | ICD-10-CM | POA: Diagnosis not present

## 2020-03-02 DIAGNOSIS — R293 Abnormal posture: Secondary | ICD-10-CM | POA: Diagnosis not present

## 2020-03-03 DIAGNOSIS — D352 Benign neoplasm of pituitary gland: Secondary | ICD-10-CM | POA: Diagnosis not present

## 2020-03-05 DIAGNOSIS — Z23 Encounter for immunization: Secondary | ICD-10-CM | POA: Diagnosis not present

## 2020-03-06 DIAGNOSIS — H25813 Combined forms of age-related cataract, bilateral: Secondary | ICD-10-CM | POA: Diagnosis not present

## 2020-03-06 DIAGNOSIS — H04123 Dry eye syndrome of bilateral lacrimal glands: Secondary | ICD-10-CM | POA: Diagnosis not present

## 2020-03-06 DIAGNOSIS — H53461 Homonymous bilateral field defects, right side: Secondary | ICD-10-CM | POA: Diagnosis not present

## 2020-03-07 DIAGNOSIS — M6281 Muscle weakness (generalized): Secondary | ICD-10-CM | POA: Diagnosis not present

## 2020-03-07 DIAGNOSIS — M545 Low back pain, unspecified: Secondary | ICD-10-CM | POA: Diagnosis not present

## 2020-03-07 DIAGNOSIS — R293 Abnormal posture: Secondary | ICD-10-CM | POA: Diagnosis not present

## 2020-03-09 DIAGNOSIS — R293 Abnormal posture: Secondary | ICD-10-CM | POA: Diagnosis not present

## 2020-03-09 DIAGNOSIS — M545 Low back pain, unspecified: Secondary | ICD-10-CM | POA: Diagnosis not present

## 2020-03-09 DIAGNOSIS — M6281 Muscle weakness (generalized): Secondary | ICD-10-CM | POA: Diagnosis not present

## 2020-03-13 DIAGNOSIS — Z6835 Body mass index (BMI) 35.0-35.9, adult: Secondary | ICD-10-CM | POA: Diagnosis not present

## 2020-03-13 DIAGNOSIS — G4700 Insomnia, unspecified: Secondary | ICD-10-CM | POA: Diagnosis not present

## 2020-03-13 DIAGNOSIS — J309 Allergic rhinitis, unspecified: Secondary | ICD-10-CM | POA: Diagnosis not present

## 2020-03-13 DIAGNOSIS — Z79899 Other long term (current) drug therapy: Secondary | ICD-10-CM | POA: Diagnosis not present

## 2020-03-13 DIAGNOSIS — Z1231 Encounter for screening mammogram for malignant neoplasm of breast: Secondary | ICD-10-CM | POA: Diagnosis not present

## 2020-03-13 DIAGNOSIS — E039 Hypothyroidism, unspecified: Secondary | ICD-10-CM | POA: Diagnosis not present

## 2020-03-13 DIAGNOSIS — E7800 Pure hypercholesterolemia, unspecified: Secondary | ICD-10-CM | POA: Diagnosis not present

## 2020-03-13 DIAGNOSIS — I10 Essential (primary) hypertension: Secondary | ICD-10-CM | POA: Diagnosis not present

## 2020-03-13 DIAGNOSIS — K219 Gastro-esophageal reflux disease without esophagitis: Secondary | ICD-10-CM | POA: Diagnosis not present

## 2020-03-22 DIAGNOSIS — R293 Abnormal posture: Secondary | ICD-10-CM | POA: Diagnosis not present

## 2020-03-22 DIAGNOSIS — M545 Low back pain, unspecified: Secondary | ICD-10-CM | POA: Diagnosis not present

## 2020-03-22 DIAGNOSIS — M6281 Muscle weakness (generalized): Secondary | ICD-10-CM | POA: Diagnosis not present

## 2020-03-28 DIAGNOSIS — Z23 Encounter for immunization: Secondary | ICD-10-CM | POA: Diagnosis not present

## 2020-03-28 DIAGNOSIS — S01312A Laceration without foreign body of left ear, initial encounter: Secondary | ICD-10-CM | POA: Diagnosis not present

## 2020-03-28 DIAGNOSIS — W1830XA Fall on same level, unspecified, initial encounter: Secondary | ICD-10-CM | POA: Diagnosis not present

## 2020-04-02 DIAGNOSIS — M25562 Pain in left knee: Secondary | ICD-10-CM | POA: Diagnosis not present

## 2020-04-02 DIAGNOSIS — S01312D Laceration without foreign body of left ear, subsequent encounter: Secondary | ICD-10-CM | POA: Diagnosis not present

## 2020-04-10 DIAGNOSIS — E038 Other specified hypothyroidism: Secondary | ICD-10-CM | POA: Diagnosis not present

## 2020-04-10 DIAGNOSIS — E221 Hyperprolactinemia: Secondary | ICD-10-CM | POA: Diagnosis not present

## 2020-04-10 DIAGNOSIS — Z87442 Personal history of urinary calculi: Secondary | ICD-10-CM | POA: Diagnosis not present

## 2020-04-10 DIAGNOSIS — D352 Benign neoplasm of pituitary gland: Secondary | ICD-10-CM | POA: Diagnosis not present

## 2020-04-12 DIAGNOSIS — M545 Low back pain, unspecified: Secondary | ICD-10-CM | POA: Diagnosis not present

## 2020-04-12 DIAGNOSIS — M6281 Muscle weakness (generalized): Secondary | ICD-10-CM | POA: Diagnosis not present

## 2020-04-12 DIAGNOSIS — R293 Abnormal posture: Secondary | ICD-10-CM | POA: Diagnosis not present

## 2020-04-17 DIAGNOSIS — D1801 Hemangioma of skin and subcutaneous tissue: Secondary | ICD-10-CM | POA: Diagnosis not present

## 2020-04-17 DIAGNOSIS — D225 Melanocytic nevi of trunk: Secondary | ICD-10-CM | POA: Diagnosis not present

## 2020-04-17 DIAGNOSIS — L821 Other seborrheic keratosis: Secondary | ICD-10-CM | POA: Diagnosis not present

## 2020-04-17 DIAGNOSIS — D2239 Melanocytic nevi of other parts of face: Secondary | ICD-10-CM | POA: Diagnosis not present

## 2020-05-08 DIAGNOSIS — Z20822 Contact with and (suspected) exposure to covid-19: Secondary | ICD-10-CM | POA: Diagnosis not present

## 2020-05-08 DIAGNOSIS — Z03818 Encounter for observation for suspected exposure to other biological agents ruled out: Secondary | ICD-10-CM | POA: Diagnosis not present

## 2020-05-11 ENCOUNTER — Encounter: Payer: Self-pay | Admitting: Gynecologic Oncology

## 2020-05-11 ENCOUNTER — Inpatient Hospital Stay: Payer: Medicare Other | Attending: Gynecologic Oncology | Admitting: Gynecologic Oncology

## 2020-05-11 ENCOUNTER — Other Ambulatory Visit: Payer: Self-pay

## 2020-05-11 VITALS — BP 144/78 | HR 88 | Temp 97.0°F | Resp 18 | Ht 62.0 in | Wt 192.0 lb

## 2020-05-11 DIAGNOSIS — F419 Anxiety disorder, unspecified: Secondary | ICD-10-CM | POA: Diagnosis not present

## 2020-05-11 DIAGNOSIS — Z90722 Acquired absence of ovaries, bilateral: Secondary | ICD-10-CM | POA: Insufficient documentation

## 2020-05-11 DIAGNOSIS — I700 Atherosclerosis of aorta: Secondary | ICD-10-CM | POA: Diagnosis not present

## 2020-05-11 DIAGNOSIS — F32A Depression, unspecified: Secondary | ICD-10-CM | POA: Diagnosis not present

## 2020-05-11 DIAGNOSIS — I10 Essential (primary) hypertension: Secondary | ICD-10-CM | POA: Diagnosis not present

## 2020-05-11 DIAGNOSIS — Z90710 Acquired absence of both cervix and uterus: Secondary | ICD-10-CM | POA: Diagnosis not present

## 2020-05-11 DIAGNOSIS — C541 Malignant neoplasm of endometrium: Secondary | ICD-10-CM

## 2020-05-11 DIAGNOSIS — Z79899 Other long term (current) drug therapy: Secondary | ICD-10-CM | POA: Insufficient documentation

## 2020-05-11 DIAGNOSIS — Z8542 Personal history of malignant neoplasm of other parts of uterus: Secondary | ICD-10-CM

## 2020-05-11 NOTE — Progress Notes (Signed)
Follow-up Note: Gyn-Onc  Kimberly Larsen 75 y.o. female  CC:  Chief Complaint  Patient presents with  . Endometrial cancer [facility])   Assessment/Plan:  75 year old with stage IA grade 1 endometrioid endometrial adenocarcinoma s/p surgical staging on 01/16/16.  Her endometrial biopsy (originally read as high grade serous) has been reviewed and felt to be more consistent with moderately differentiated endometrial cancer.  Therefore, given the dominant grade 1 disease in her hystectomy specimen, she was felt to have a low risk cancer.  Pathology revealed low risk factors for recurrence, therefore no adjuvant therapy was recommended according to NCCN guidelines.  I discussed risk for recurrence and typical symptoms encouraged her to notify us of these should they develop between visits.  I recommend she continue to have follow-up every 6 months for 5 years in accordance with NCCN guidelines. Those visits should include symptom assessment, physical exam and pelvic examination. Pap smears are not indicated or recommended in the routine surveillance of endometrial cancer. I recommend she return to see me one more visit after which we will discharge her from cancer surveillance if she remains cancer free.   HPI: Patient was originally seen on 01/10/16 in consultation at the request of Dr. Pamala Hurry.   Patient is a very pleasant 75 year old gravida 1 para 1 who has a long history of renal stones and has been seen by urology. She presented to urology and was seen by Dr. Alinda Money on August 30 for complaints of gross hematuria and right lower quadrant pain. However at that time she was noted to have vaginal bleeding and her bladder catheterization revealed no blood within the bladder. She was seen by Dr. Valentino Saxon on 12/29/2015. At that time an endometrial biopsy was performed as well as a Pap smear. Pap smear was unremarkable. 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 Prior to this  episode of bleeding she had not had any menses for approximately 16 years.  On 01/16/16 she underwent robotic assisted total hysterectomy, BSO and pelvic lymphadenectomy. Final pathology revealed stage IA grade 1 endometrial cancer with inner half myometrial invasion and no LVSI. She was noted to have low risk factors of final pathology and therefore no adjuvant therapy was recommended in accordance with NCCN guidelines.  Interval Hx:  She presents today for routine follow-up.  She has been diagnosed with a pituitary tumor (treated medically) and S1 neuropathy. She had rocky mountain spotted fever diagnosed in February, 2021 with persistent fatigue after that.   No symptoms of recurrence of her endometrial cancer.   Review of Systems  Constitutional:  Denies fever. Skin: No rash, + hirsuitism Cardiovascular: No chest pain, shortness of breath except as above, or edema  Pulmonary: No cough  Gastro Intestinal: Slight nausea, vomiting, constipation, or diarrhea reported. No change in bowel movement.  Genitourinary: No frequency, urgency, or dysuria.  no vaginal bleeding, no discharge.  Musculoskeletal: + Low back pain and joint pain in her knees.  Neurologic: No weakness, numbness, or change in gait.  Psychology: Anxious  Current Meds:  Outpatient Encounter Medications as of 05/11/2020  Medication Sig  . ALPRAZolam (XANAX) 0.25 MG tablet Take 0.5 mg by mouth 3 (three) times daily as needed for anxiety.   Marland Kitchen amitriptyline (ELAVIL) 75 MG tablet TAKE 1 TABLET BY MOUTH AT BEDTIME. (Patient taking differently: Take 75 mg by mouth at bedtime.)  . aspirin 81 MG chewable tablet Chew 1 tablet by mouth daily.  Marland Kitchen b complex vitamins capsule Take 1 capsule by mouth daily.  Marland Kitchen  cabergoline (DOSTINEX) 0.5 MG tablet Take 0.5 mg by mouth See admin instructions. Patient takes tablets 1 mg on Tuesday and friday  . Cholecalciferol (VITAMIN D3) 125 MCG (5000 UT) CAPS Take 1 capsule by mouth daily.  Marland Kitchen docusate  sodium (COLACE) 50 MG capsule Take 50 mg by mouth at bedtime.   . fluticasone (FLONASE) 50 MCG/ACT nasal spray Place 1 spray into both nostrils daily as needed for allergies.   Marland Kitchen levothyroxine (SYNTHROID) 25 MCG tablet Take 25 mcg by mouth daily before breakfast.   . Magnesium 400 MG CAPS Take 400 mg by mouth daily.  . Melatonin 10 MG CAPS Take 10 mg by mouth at bedtime.  . metoprolol succinate (TOPROL-XL) 25 MG 24 hr tablet Take 25 mg by mouth daily.   . montelukast (SINGULAIR) 10 MG tablet Take 10 mg by mouth at bedtime.   . Multiple Vitamin (MULTIVITAMIN WITH MINERALS) TABS Take 1 tablet by mouth daily.  . Omega-3 Fatty Acids (FISH OIL) 1000 MG CAPS Take 1 capsule by mouth in the morning and at bedtime.   Marland Kitchen omeprazole (PRILOSEC) 20 MG capsule Take 1 capsule (20 mg total) by mouth daily. (Patient taking differently: Take 20 mg by mouth at bedtime.)  . polyethylene glycol (MIRALAX / GLYCOLAX) 17 g packet Take 17 g by mouth 2 (two) times daily. (Patient taking differently: Take 17 g by mouth daily as needed for moderate constipation.)  . pravastatin (PRAVACHOL) 40 MG tablet Take 1 tablet (40 mg total) by mouth at bedtime.  . terbinafine (LAMISIL) 250 MG tablet Take 250 mg by mouth daily.  Marland Kitchen tiZANidine (ZANAFLEX) 2 MG tablet Take 1 tablet by mouth 3 (three) times daily as needed for muscle spasms.   . traZODone (DESYREL) 100 MG tablet Take 150 mg by mouth at bedtime.   . [DISCONTINUED] pilocarpine (SALAGEN) 5 MG tablet Take 5 mg by mouth 4 (four) times daily.  . [DISCONTINUED] tamsulosin (FLOMAX) 0.4 MG CAPS capsule Take 0.4 mg by mouth at bedtime. (Patient not taking: Reported on 02/15/2020)   No facility-administered encounter medications on file as of 05/11/2020.    Allergy:  Allergies  Allergen Reactions  . Amoxicillin-Pot Clavulanate Nausea Only    Other reaction(s): stomach upset  . Bupropion Nausea Only    Other reaction(s): stomach upset    Social Hx:   Social History    Socioeconomic History  . Marital status: Divorced    Spouse name: Not on file  . Number of children: 1  . Years of education: 37  . Highest education level: Not on file  Occupational History  . Not on file  Tobacco Use  . Smoking status: Never Smoker  . Smokeless tobacco: Never Used  Vaping Use  . Vaping Use: Never used  Substance and Sexual Activity  . Alcohol use: No  . Drug use: No  . Sexual activity: Yes  Other Topics Concern  . Not on file  Social History Narrative   Lives alone   Social Determinants of Health   Financial Resource Strain: Not on file  Food Insecurity: Not on file  Transportation Needs: Not on file  Physical Activity: Not on file  Stress: Not on file  Social Connections: Not on file  Intimate Partner Violence: Not on file    Past Surgical Hx:  Past Surgical History:  Procedure Laterality Date  . BACK SURGERY  2013  . CARPAL TUNNEL RELEASE     R hand  . CYSTOSCOPY WITH URETEROSCOPY AND STENT PLACEMENT Bilateral  03/25/2014   Procedure: CYSTOSCOPY WITH URETEROSCOPY AND STENT PLACEMENT;  Surgeon: Raynelle Bring, MD;  Location: [facility];  Service: Urology;  Laterality: Bilateral;  . CYSTOSCOPY WITH URETEROSCOPY AND STENT PLACEMENT Bilateral 04/18/2014   Procedure: CYSTOSCOPY WITH URETEROSCOPY AND STENT PLACEMENT,RETROGRADE;  Surgeon: Raynelle Bring, MD;  Location: [facility];  Service: Urology;  Laterality: Bilateral;  . CYSTOSCOPY WITH URETEROSCOPY AND STENT PLACEMENT Right 05/30/2014   Procedure: CYSTOSCOPY WITH URETEROSCOPY AND STENT PLACEMENT;  Surgeon: Raynelle Bring, MD;  Location: [facility];  Service: Urology;  Laterality: Right;  . CYSTOSCOPY/RETROGRADE/URETEROSCOPY Left 05/30/2014   Procedure: LEFT RETROGRADE;  Surgeon: Raynelle Bring, MD;  Location: [facility];  Service: Urology;  Laterality: Left;  . CYSTOSCOPY/URETEROSCOPY/HOLMIUM LASER/STENT PLACEMENT Left 11/25/2019   Procedure: CYSTOSCOPY/RETROGRADE/URETEROSCOPY/HOLMIUM LASER/STENT PLACEMENT;  Surgeon:  Raynelle Bring, MD;  Location: [facility];  Service: Urology;  Laterality: Left;  . EYE SURGERY     cataract surgery bil  . GANGLION CYST EXCISION     L ankle  . HAMMERTOE RECONSTRUCTION WITH WEIL OSTEOTOMY Left 09/05/2016   Procedure: Second Metatarsal Weil Osteotomy and Hammertoe Correction;  Surgeon: Wylene Simmer, MD;  Location: [place];  Service: Orthopedics;  Laterality: Left;  . HOLMIUM LASER APPLICATION Bilateral 30/16/0109   Procedure: HOLMIUM LASER APPLICATION;  Surgeon: Raynelle Bring, MD;  Location: [facility];  Service: Urology;  Laterality: Bilateral;  . JOINT REPLACEMENT     total knee  . LITHOTRIPSY     x 3  . LUMBAR FUSION  09/2011  . METATARSAL OSTEOTOMY WITH BUNIONECTOMY Left 09/05/2016   Procedure: Left First Metatarsal Scarf Osteotomy, Modified McBride Bunion Correction;  Surgeon: Wylene Simmer, MD;  Location: [place];  Service: Orthopedics;  Laterality: Left;  . RHINOPLASTY    . ROBOTIC ASSISTED TOTAL HYSTERECTOMY WITH BILATERAL SALPINGO OOPHERECTOMY Bilateral 01/16/2016   Procedure: XI ROBOTIC ASSISTED TOTAL HYSTERECTOMY WITH BILATERAL SALPINGO OOPHORECTOMY AND BILATERAL PELVIC LYMPH NODE DISSECTION;  Surgeon: Everitt Amber, MD;  Location: [facility];  Service: Gynecology;  Laterality: Bilateral;  . SPINAL CORD STIMULATOR INSERTION N/A 02/23/2014   Procedure: SPINAL CORD STIMULATOR PLACEMENT ;  Surgeon: Melina Schools, MD;  Location: [place];  Service: Orthopedics;  Laterality: N/A;  . TONSILLECTOMY    . TOTAL KNEE ARTHROPLASTY Right 08/31/2012   Procedure: RIGHT TOTAL KNEE ARTHROPLASTY;  Surgeon: Mauri Pole, MD;  Location[facility];  Service: Orthopedics;  Laterality: Right;  with LMA  . TOTAL KNEE ARTHROPLASTY Left 11/10/2018   Procedure: TOTAL KNEE ARTHROPLASTY;  Surgeon: Paralee Cancel, MD;  Location[facility];  Service: Orthopedics;  Laterality: Left;  70 mins  . TUBAL LIGATION      Past Medical Hx:  Past Medical History:  Diagnosis Date  . Acute  postoperative respiratory failure ([place]) 03/25/2014  . AKI (acute kidney injury) ([place]) 03/25/2014  . Anesthesia of skin   . Anxiety   . Aortic atherosclerosis ([place])   . Arthritis   . Back pain   . Benign essential HTN 03/25/2014  . Cancer [facility])    endometrial cancer  . Chronic daily headache 10/23/2017  . Chronic low back pain 07/30/2017  . Chronic pain syndrome 02/23/2014  . DDD (degenerative disc disease), lumbar   . Degeneration of lumbar intervertebral disc 05/10/2019  . Depression   . Endometrial cancer ([place]) 01/10/2016  . Expected blood loss anemia 09/02/2012  . Fibromyalgia 09/08/2012  . Gastroparesis   . Genetic testing 12/17/2017   TumorNext [facility] + CancerNext was ordered. The CancerNext gene panel offered by  [facility] includes sequencing and rearrangement analysis for the following 32 genes:   APC, ATM, BARD1, BMPR1A, BRCA1, BRCA2, BRIP1, CDH1, CDK4, CDKN2A, CHEK2, DICER1, EPCAM, GREM1, HOXB13MLH1, MRE11A, MSH2, MSH6, MUTYH, NBN, NF1, PALB2, PMS2, POLD1, POLE, PTEN, RAD50, RAD51D, SMAD4, SMARCA4, STK11, and TP53.   Ger  . History of kidney stones   . HLD (hyperlipidemia)   . Hx of sepsis 02/2014   DUE TO MULTIPLE KIDNEY STONES  . Hyperlipemia   . Hyperlipidemia 03/25/2014  . Hypertension   . HYPERTENSION, BENIGN SYSTEMIC 06/26/2006   Qualifier: Diagnosis of  By: Herma Ard    . Insomnia 09/08/2012  . Lactic acidosis 07/10/2017  . Long-term current use of opiate analgesic 05/28/2017  . Lumbar post-laminectomy syndrome 07/30/2017  . Near syncope 07/10/2017  . NEPHROLITHIASIS 06/26/2006   Qualifier: Diagnosis of  By: Herma Ard    . Obese 11/11/2018  . Obesity (BMI 30-39.9) 09/02/2012  . Obstructive uropathy 03/25/2014  . Osteoporosis   . Pain in left knee 01/08/2018  . Pneumonia    2014  . Prolactinoma ([place]) 07/16/2017  . S/P left TKA 11/10/2018  . S/P right TKA 08/31/2012  . Severe sepsis with septic shock ([place]) 03/25/2014  . UTI (urinary tract infection)      Oncology Hx:  Oncology History  Endometrial cancer ([place])  12/29/2015 Initial Diagnosis   Endometrial cancer [facility])    Genetic Testing   Patient has genetic testing done for mismatch repair protien. Results revealed patient has the following mutation(s): Abnormal: MSH6: loss of nuclear expression (less than 5% tumor expression).     Family Hx:  Family History  Problem Relation Age of Onset  . Stroke Mother   . Emphysema Mother   . COPD Mother   . Hypertension Mother   . Stroke Father   . Heart disease Father   . Emphysema Father   . Hypertension Father   . Heart disease Brother     Vitals:  Blood pressure (!) 144/78, pulse 88, temperature (!) 97 F (36.1 C), temperature source Tympanic, resp. rate 18, height 5' 2"  (1.575 m), weight 192 lb (87.1 kg), SpO2 100 %.  Physical Exam: Well-nourished well-developed female in no acute distress.   Neck: Supple, no lymphadenopathy, no thyromegaly.  Lungs: no increased WOB  Cardiac: well perfused peripheries.   Abdomen: Obese, soft, nontender, nondistended. No palpable masses. There is no rebound, no guarding. No fluid wave. There is no hepatosplenomegaly. Incisions are soft.  Groins: No lymphadenopathy  Extremities: No edema  Pelvic: Normal female genitalia. The vagina is atrophic. Vaginal cuff normal. No blood or discharge. Cuff smooth, no masses palpable   Thereasa Solo, MD 05/11/2020, 3:07 PM

## 2020-05-11 NOTE — Patient Instructions (Signed)
Please notify Dr Denman George at phone number [phone] if you notice vaginal bleeding, new pelvic or abdominal pains, bloating, feeling full easy, or a change in bladder or bowel function.   Please contact Dr Serita Grit office (at [phone]) in or after April to request an appointment with her for July, 2022.

## 2020-05-12 DIAGNOSIS — Z1231 Encounter for screening mammogram for malignant neoplasm of breast: Secondary | ICD-10-CM | POA: Diagnosis not present

## 2020-06-06 ENCOUNTER — Other Ambulatory Visit: Payer: Self-pay | Admitting: Internal Medicine

## 2020-06-09 NOTE — Telephone Encounter (Signed)
Rx refill request for your review

## 2020-06-12 DIAGNOSIS — E7800 Pure hypercholesterolemia, unspecified: Secondary | ICD-10-CM | POA: Diagnosis not present

## 2020-06-12 DIAGNOSIS — I10 Essential (primary) hypertension: Secondary | ICD-10-CM | POA: Diagnosis not present

## 2020-06-12 DIAGNOSIS — Z6836 Body mass index (BMI) 36.0-36.9, adult: Secondary | ICD-10-CM | POA: Diagnosis not present

## 2020-06-12 DIAGNOSIS — Z1331 Encounter for screening for depression: Secondary | ICD-10-CM | POA: Diagnosis not present

## 2020-06-12 DIAGNOSIS — K219 Gastro-esophageal reflux disease without esophagitis: Secondary | ICD-10-CM | POA: Diagnosis not present

## 2020-06-12 DIAGNOSIS — Z79899 Other long term (current) drug therapy: Secondary | ICD-10-CM | POA: Diagnosis not present

## 2020-06-12 DIAGNOSIS — E039 Hypothyroidism, unspecified: Secondary | ICD-10-CM | POA: Diagnosis not present

## 2020-06-12 DIAGNOSIS — I700 Atherosclerosis of aorta: Secondary | ICD-10-CM | POA: Diagnosis not present

## 2020-06-22 DIAGNOSIS — Z79899 Other long term (current) drug therapy: Secondary | ICD-10-CM | POA: Diagnosis not present

## 2020-06-22 DIAGNOSIS — E7800 Pure hypercholesterolemia, unspecified: Secondary | ICD-10-CM | POA: Diagnosis not present

## 2020-06-22 DIAGNOSIS — E039 Hypothyroidism, unspecified: Secondary | ICD-10-CM | POA: Diagnosis not present

## 2020-06-29 DIAGNOSIS — R748 Abnormal levels of other serum enzymes: Secondary | ICD-10-CM | POA: Diagnosis not present

## 2020-07-03 DIAGNOSIS — R0982 Postnasal drip: Secondary | ICD-10-CM | POA: Diagnosis not present

## 2020-07-03 DIAGNOSIS — E7800 Pure hypercholesterolemia, unspecified: Secondary | ICD-10-CM | POA: Diagnosis not present

## 2020-07-03 DIAGNOSIS — R059 Cough, unspecified: Secondary | ICD-10-CM | POA: Diagnosis not present

## 2020-08-25 DIAGNOSIS — N200 Calculus of kidney: Secondary | ICD-10-CM | POA: Diagnosis not present

## 2020-08-29 DIAGNOSIS — J301 Allergic rhinitis due to pollen: Secondary | ICD-10-CM | POA: Diagnosis not present

## 2020-09-04 ENCOUNTER — Other Ambulatory Visit: Payer: Self-pay | Admitting: Radiation Therapy

## 2020-09-06 ENCOUNTER — Encounter (HOSPITAL_COMMUNITY): Payer: Self-pay

## 2020-09-06 ENCOUNTER — Ambulatory Visit (HOSPITAL_COMMUNITY)
Admission: RE | Admit: 2020-09-06 | Discharge: 2020-09-06 | Disposition: A | Discharge status: Home / Self Care | Payer: Medicare Other | Source: Ambulatory Visit | Attending: Internal Medicine | Admitting: Internal Medicine

## 2020-09-06 ENCOUNTER — Other Ambulatory Visit: Payer: Self-pay

## 2020-09-06 DIAGNOSIS — Z8669 Personal history of other diseases of the nervous system and sense organs: Secondary | ICD-10-CM | POA: Diagnosis not present

## 2020-09-06 DIAGNOSIS — D352 Benign neoplasm of pituitary gland: Secondary | ICD-10-CM | POA: Insufficient documentation

## 2020-09-06 DIAGNOSIS — E236 Other disorders of pituitary gland: Secondary | ICD-10-CM | POA: Diagnosis not present

## 2020-09-06 DIAGNOSIS — Q046 Congenital cerebral cysts: Secondary | ICD-10-CM | POA: Diagnosis not present

## 2020-09-06 DIAGNOSIS — R220 Localized swelling, mass and lump, head: Secondary | ICD-10-CM | POA: Diagnosis not present

## 2020-09-06 LAB — POCT I-STAT CREATININE: Creatinine, Ser: 0.7 mg/dL (ref 0.44–1.00)

## 2020-09-06 MED ORDER — SODIUM CHLORIDE (PF) 0.9 % IJ SOLN
INTRAMUSCULAR | Status: AC
  Filled 2020-09-06: qty 50

## 2020-09-06 MED ORDER — IOHEXOL 300 MG/ML  SOLN
75.0000 mL | Freq: Once | INTRAMUSCULAR | Status: AC | PRN
  Administered 2020-09-06: 75 mL via INTRAVENOUS

## 2020-09-11 ENCOUNTER — Inpatient Hospital Stay: Payer: Medicare Other | Attending: Internal Medicine

## 2020-09-11 DIAGNOSIS — L03114 Cellulitis of left upper limb: Secondary | ICD-10-CM | POA: Diagnosis not present

## 2020-09-11 DIAGNOSIS — S51859A Open bite of unspecified forearm, initial encounter: Secondary | ICD-10-CM | POA: Diagnosis not present

## 2020-09-11 DIAGNOSIS — W5501XA Bitten by cat, initial encounter: Secondary | ICD-10-CM | POA: Diagnosis not present

## 2020-09-22 DIAGNOSIS — Z23 Encounter for immunization: Secondary | ICD-10-CM | POA: Diagnosis not present

## 2020-09-24 DIAGNOSIS — R82998 Other abnormal findings in urine: Secondary | ICD-10-CM | POA: Diagnosis not present

## 2020-09-24 DIAGNOSIS — Z20822 Contact with and (suspected) exposure to covid-19: Secondary | ICD-10-CM | POA: Diagnosis not present

## 2020-09-24 DIAGNOSIS — R6889 Other general symptoms and signs: Secondary | ICD-10-CM | POA: Diagnosis not present

## 2020-10-03 ENCOUNTER — Inpatient Hospital Stay: Payer: Medicare Other | Attending: Internal Medicine | Admitting: Internal Medicine

## 2020-10-03 DIAGNOSIS — D352 Benign neoplasm of pituitary gland: Secondary | ICD-10-CM

## 2020-10-03 MED ORDER — CABERGOLINE 0.5 MG PO TABS
0.2500 mg | ORAL_TABLET | ORAL | Status: DC
Start: 2020-10-03 — End: 2022-11-06

## 2020-10-03 NOTE — Progress Notes (Signed)
I connected with Kimberly Larsen on 10/03/20 at 11:30 AM EDT by telephone visit and verified that I am speaking with the correct person using two identifiers.  I discussed the limitations, risks, security and privacy concerns of performing an evaluation and management service by telemedicine and the availability of in-person appointments. I also discussed with the patient that there may be a patient responsible charge related to this service. The patient expressed understanding and agreed to proceed.  Other persons participating in the visit and their role in the encounter:  n/a  Patient's location:  Home  Provider's location:  Office  Chief Complaint:  Prolactinoma [facility])  History of Present Ilness: Kimberly Larsen describes no new or progressive neurologic deficits.  No frequent headaches, no seizures.  Continues to take cabergoline 0.25mg  twice per week through Dr. Buddy Duty.   Observations: Language and cognition at baseline  Imaging:  [place] Clinician Interpretation: I have personally reviewed the CNS images as listed.  My interpretation, in the context of the patient's clinical presentation, is stable disease  CT Head W Wo Contrast  Result Date: 09/07/2020 CLINICAL DATA:  75 year old female with history of chronic pituitary tumor, colloid cyst. Restaging. EXAM: CT HEAD WITHOUT AND WITH CONTRAST TECHNIQUE: Contiguous axial images were obtained from the base of the skull through the vertex without and with intravenous contrast CONTRAST:  24mL OMNIPAQUE IOHEXOL 300 MG/ML  SOLN COMPARISON:  Head CTs without and with contrast 11/29/2019 and earlier. FINDINGS: Brain: Stable 8 mm hyperdense colloid cyst. Stable ventricle size and configuration. No ventriculomegaly. No transependymal edema. No acute intracranial hemorrhage identified. No cortically based acute infarct identified. Stable gray-white matter differentiation throughout the brain. Mild for age scattered white matter hypodensity, most pronounced in the  right frontal operculum subcortical white matter. No cortical encephalomalacia identified. Hypoenhancing soft tissue mass at the floor of the sella turcica and tracking leftward into the cavernous sinus is redemonstrated (series 5, image 29 and series 6, image 34) and remains stable in size and configuration, 21 mm transverse by about 12 mm AP and 8-9 mm cc. No suprasellar extension or suprasellar mass effect. The right cavernous sinus seems to remain spared as before. Vascular: Calcified atherosclerosis at the skull base. The major intracranial vascular structures are enhancing and appear to be patent. No abnormal gray or white matter enhancement. Skull: Stable smooth remodeling of the skull base at the sella turcica. No acute osseous abnormality identified. Sinuses/Orbits: Visualized paranasal sinuses and mastoids are stable and well aerated. Other: Stable, negative orbit and scalp soft tissues. IMPRESSION: 1. Stable chronic pituitary hypoenhancing mass at the floor of the sella turcica and infiltrating left cavernous sinus, approximately 2.1 cm long axis by CT. 2. No acute intracranial abnormality. Chronic 8 mm colloid cyst is stable along with ventricle size and configuration. Electronically Signed   By: Genevie Ann M.D.   On: 09/07/2020 06:24   Assessment and Plan:  Clinically and radiographically stable.  Will con't dopamine agonist through endocrinology to control adenoma growth. Follow Up Instructions: Repeat CT head in 1 year and return to clinic  I discussed the assessment and treatment plan with the patient.  The patient was provided an opportunity to ask questions and all were answered.  The patient agreed with the plan and demonstrated understanding of the instructions.    The patient was advised to call back or seek an in-person evaluation if the symptoms worsen or if the condition fails to improve as anticipated.  I provided 5-10 minutes of  non-face-to-face time during this enocunter.  Ventura Sellers, MD   I provided 25 minutes of non face-to-face telephone visit time during this encounter, and > 50% was spent counseling as documented under my assessment & plan.

## 2020-10-04 DIAGNOSIS — E539 Vitamin B deficiency, unspecified: Secondary | ICD-10-CM | POA: Diagnosis not present

## 2020-10-04 DIAGNOSIS — R5383 Other fatigue: Secondary | ICD-10-CM | POA: Diagnosis not present

## 2020-10-04 DIAGNOSIS — E559 Vitamin D deficiency, unspecified: Secondary | ICD-10-CM | POA: Diagnosis not present

## 2020-10-04 DIAGNOSIS — Z6836 Body mass index (BMI) 36.0-36.9, adult: Secondary | ICD-10-CM | POA: Diagnosis not present

## 2020-10-16 DIAGNOSIS — L680 Hirsutism: Secondary | ICD-10-CM | POA: Diagnosis not present

## 2020-10-16 DIAGNOSIS — D352 Benign neoplasm of pituitary gland: Secondary | ICD-10-CM | POA: Diagnosis not present

## 2020-10-16 DIAGNOSIS — E221 Hyperprolactinemia: Secondary | ICD-10-CM | POA: Diagnosis not present

## 2020-10-16 DIAGNOSIS — E038 Other specified hypothyroidism: Secondary | ICD-10-CM | POA: Diagnosis not present

## 2020-10-31 DIAGNOSIS — D352 Benign neoplasm of pituitary gland: Secondary | ICD-10-CM | POA: Diagnosis not present

## 2020-11-03 ENCOUNTER — Encounter: Payer: Self-pay | Admitting: Cardiology

## 2020-11-03 ENCOUNTER — Other Ambulatory Visit: Payer: Self-pay

## 2020-11-03 ENCOUNTER — Ambulatory Visit (INDEPENDENT_AMBULATORY_CARE_PROVIDER_SITE_OTHER): Payer: Medicare Other | Admitting: Cardiology

## 2020-11-03 VITALS — BP 116/70 | HR 78 | Ht 62.0 in | Wt 197.0 lb

## 2020-11-03 DIAGNOSIS — R0600 Dyspnea, unspecified: Secondary | ICD-10-CM

## 2020-11-03 DIAGNOSIS — R9431 Abnormal electrocardiogram [ECG] [EKG]: Secondary | ICD-10-CM | POA: Diagnosis not present

## 2020-11-03 DIAGNOSIS — R0609 Other forms of dyspnea: Secondary | ICD-10-CM

## 2020-11-03 DIAGNOSIS — E782 Mixed hyperlipidemia: Secondary | ICD-10-CM

## 2020-11-03 NOTE — Progress Notes (Signed)
Cardiology Office Note:    Date:  11/03/2020   ID:  Kimberly Larsen, DOB 1945-10-06, MRN [id number]  PCP:  Haydee Salter, NP  Cardiologist:  Jenne Campus, MD    Referring MD: Ernestene Kiel, MD   Chief Complaint  Patient presents with   Shortness of Breath    History of Present Illness:    Kimberly Larsen is a 75 y.o. female past medical history significant for chronic shortness of breath, history of endometrial cancer, fibromyalgia, gastroparesis.  She was follow-up with [place] because of shortness of breath.  She did have echocardiogram done as well as stress test both were negative.  That was in the middle of last year.  She comes today 2 months for follow-up.  She does the same.  She was found to have prolactinoma.  She retired.  She admits that she sits all day and watch TV.  And she understand this is a problem.  She is trying to go to the gym on the regular basis but slacked off for last couple days and she feels guilty about this.  She is doing ice having any chest pain tightness squeezing pressure burning chest.  Past Medical History:  Diagnosis Date   Acute postoperative respiratory failure ([place]) 03/25/2014   AKI (acute kidney injury) ([place]) 03/25/2014   Anesthesia of skin    Anxiety    Aortic atherosclerosis ([facility])    Arthritis    Back pain    Benign essential HTN 03/25/2014   Cancer ([place])    endometrial cancer   Chronic daily headache 10/23/2017   Chronic low back pain 07/30/2017   Chronic pain syndrome 02/23/2014   DDD (degenerative disc disease), lumbar    Degeneration of lumbar intervertebral disc 05/10/2019   Depression    Endometrial cancer ([place]) 01/10/2016   Expected blood loss anemia 09/02/2012   Fibromyalgia 09/08/2012   Gastroparesis    Genetic testing 12/17/2017   TumorNext [facility] + CancerNext was ordered. The CancerNext gene panel offered by [employer] includes sequencing and rearrangement analysis for the following 32 genes:   APC, ATM, BARD1, BMPR1A, BRCA1,  BRCA2, BRIP1, CDH1, CDK4, CDKN2A, CHEK2, DICER1, EPCAM, GREM1, HOXB13MLH1, MRE11A, MSH2, MSH6, MUTYH, NBN, NF1, PALB2, PMS2, POLD1, POLE, PTEN, RAD50, RAD51D, SMAD4, SMARCA4, STK11, and TP53.   Ger   History of kidney stones    HLD (hyperlipidemia)    Hx of sepsis 02/2014   DUE TO MULTIPLE KIDNEY STONES   Hyperlipemia    Hyperlipidemia 03/25/2014   Hypertension    HYPERTENSION, BENIGN SYSTEMIC 06/26/2006   Qualifier: Diagnosis of  By: Herma Ard     Insomnia 05/04/2692   Lactic acidosis 07/10/2017   Long-term current use of opiate analgesic 05/28/2017   Lumbar post-laminectomy syndrome 07/30/2017   Near syncope 07/10/2017   NEPHROLITHIASIS 06/26/2006   Qualifier: Diagnosis of  By: Herma Ard     Obese 8/54/6270   Obesity (BMI 30-39.9) 09/02/2012   Obstructive uropathy 03/25/2014   Osteoporosis    Pain in left knee 01/08/2018   Pneumonia    2014   Prolactinoma ([place]) 07/16/2017   S/P left TKA 11/10/2018   S/P right TKA 08/31/2012   Severe sepsis with septic shock ([place]) 03/25/2014   UTI (urinary tract infection)     Past Surgical History:  Procedure Laterality Date   BACK SURGERY  2013   CARPAL TUNNEL RELEASE     R hand   CYSTOSCOPY WITH URETEROSCOPY AND STENT PLACEMENT Bilateral 03/25/2014   Procedure: CYSTOSCOPY  WITH URETEROSCOPY AND STENT PLACEMENT;  Surgeon: Raynelle Bring, MD;  Location: [facility];  Service: Urology;  Laterality: Bilateral;   CYSTOSCOPY WITH URETEROSCOPY AND STENT PLACEMENT Bilateral 04/18/2014   Procedure: CYSTOSCOPY WITH URETEROSCOPY AND STENT PLACEMENT,RETROGRADE;  Surgeon: Raynelle Bring, MD;  Location: [facility];  Service: Urology;  Laterality: Bilateral;   CYSTOSCOPY WITH URETEROSCOPY AND STENT PLACEMENT Right 05/30/2014   Procedure: CYSTOSCOPY WITH URETEROSCOPY AND STENT PLACEMENT;  Surgeon: Raynelle Bring, MD;  Location: [facility];  Service: Urology;  Laterality: Right;   CYSTOSCOPY/RETROGRADE/URETEROSCOPY Left 05/30/2014   Procedure: LEFT RETROGRADE;  Surgeon:  Raynelle Bring, MD;  Location[facility];  Service: Urology;  Laterality: Left;   CYSTOSCOPY/URETEROSCOPY/HOLMIUM LASER/STENT PLACEMENT Left 11/25/2019   Procedure: CYSTOSCOPY/RETROGRADE/URETEROSCOPY/HOLMIUM LASER/STENT PLACEMENT;  Surgeon: Raynelle Bring, MD;  Location[facility];  Service: Urology;  Laterality: Left;   EYE SURGERY     cataract surgery bil   GANGLION CYST EXCISION     L ankle   HAMMERTOE RECONSTRUCTION WITH WEIL OSTEOTOMY Left 09/05/2016   Procedure: Second Metatarsal Weil Osteotomy and Hammertoe Correction;  Surgeon: Wylene Simmer, MD;  Location: [facility];  Service: Orthopedics;  Laterality: Left;   HOLMIUM LASER APPLICATION Bilateral 09/32/6712   Procedure: HOLMIUM LASER APPLICATION;  Surgeon: Raynelle Bring, MD;  Location[facility];  Service: Urology;  Laterality: Bilateral;   JOINT REPLACEMENT     total knee   LITHOTRIPSY     x 3   LUMBAR FUSION  09/2011   METATARSAL OSTEOTOMY WITH BUNIONECTOMY Left 09/05/2016   Procedure: Left First Metatarsal Scarf Osteotomy, Modified McBride Bunion Correction;  Surgeon: Wylene Simmer, MD;  Location: [facility];  Service: Orthopedics;  Laterality: Left;   RHINOPLASTY     ROBOTIC ASSISTED TOTAL HYSTERECTOMY WITH BILATERAL SALPINGO OOPHERECTOMY Bilateral 01/16/2016   Procedure: XI ROBOTIC ASSISTED TOTAL HYSTERECTOMY WITH BILATERAL SALPINGO OOPHORECTOMY AND BILATERAL PELVIC LYMPH NODE DISSECTION;  Surgeon: Everitt Amber, MD;  Location[facility];  Service: Gynecology;  Laterality: Bilateral;   SPINAL CORD STIMULATOR INSERTION N/A 02/23/2014   Procedure: SPINAL CORD STIMULATOR PLACEMENT ;  Surgeon: Melina Schools, MD;  Location: [facility];  Service: Orthopedics;  Laterality: N/A;   TONSILLECTOMY     TOTAL KNEE ARTHROPLASTY Right 08/31/2012   Procedure: RIGHT TOTAL KNEE ARTHROPLASTY;  Surgeon: Mauri Pole, MD;  [facility];  Service: Orthopedics;  Laterality: Right;  with LMA   TOTAL KNEE ARTHROPLASTY Left 11/10/2018    Procedure: TOTAL KNEE ARTHROPLASTY;  Surgeon: Paralee Cancel, MD;  Location[facility];  Service: Orthopedics;  Laterality: Left;  70 mins   TUBAL LIGATION      Current Medications: Current Meds  Medication Sig   ALPRAZolam (XANAX) 0.25 MG tablet Take 0.5 mg by mouth 3 (three) times daily as needed for anxiety.    amitriptyline (ELAVIL) 75 MG tablet Take 1 tablet (75 mg total) by mouth at bedtime.   aspirin 81 MG EC tablet Chew 1 tablet by mouth daily.   cabergoline (DOSTINEX) 0.5 MG tablet Take 0.5 tablets (0.25 mg total) by mouth See admin instructions. Patient takes tablets 1 mg on Tuesday and friday (Patient taking differently: Take 1 mg by mouth See admin instructions. Patient takes tablets 1 mg on Tuesday and friday)   docusate sodium (COLACE) 50 MG capsule Take 50 mg by mouth at bedtime.    fluticasone (FLONASE) 50 MCG/ACT nasal spray Place 1 spray into both nostrils daily as needed for allergies.    levothyroxine (SYNTHROID) 25 MCG tablet Take 25  mcg by mouth daily before breakfast.    Magnesium 400 MG TABS Take 400 mg by mouth daily.   Melatonin 10 MG TABS Take 10 mg by mouth at bedtime.   metoprolol succinate (TOPROL-XL) 25 MG 24 hr tablet Take 25 mg by mouth daily.    montelukast (SINGULAIR) 10 MG tablet Take 10 mg by mouth at bedtime.    Omega-3 Fatty Acids (FISH OIL) 1000 MG CAPS Take 1 capsule by mouth in the morning and at bedtime.    omeprazole (PRILOSEC) 20 MG capsule Take 1 capsule (20 mg total) by mouth daily. (Patient taking differently: Take 20 mg by mouth at bedtime.)   polyethylene glycol (MIRALAX / GLYCOLAX) 17 g packet Take 17 g by mouth 2 (two) times daily. (Patient taking differently: Take 17 g by mouth daily as needed for moderate constipation.)   pravastatin (PRAVACHOL) 40 MG tablet Take 1 tablet (40 mg total) by mouth at bedtime.   tiZANidine (ZANAFLEX) 2 MG tablet Take 1 tablet by mouth 3 (three) times daily as needed for muscle spasms.    traZODone (DESYREL) 150 MG  tablet Take 150 mg by mouth at bedtime.      Allergies:   Amoxicillin-pot clavulanate and Bupropion   Social History   Socioeconomic History   Marital status: Divorced    Spouse name: Not on file   Number of children: 1   Years of education: 12   Highest education level: Not on file  Occupational History   Not on file  Tobacco Use   Smoking status: Never   Smokeless tobacco: Never  Vaping Use   Vaping Use: Never used  Substance and Sexual Activity   Alcohol use: No   Drug use: No   Sexual activity: Yes  Other Topics Concern   Not on file  Social History Narrative   Lives alone   Social Determinants of Health   Financial Resource Strain: Not on file  Food Insecurity: Not on file  Transportation Needs: Not on file  Physical Activity: Not on file  Stress: Not on file  Social Connections: Not on file     Family History: The patient's family history includes COPD in her mother; Emphysema in her father and mother; Heart disease in her brother and father; Hypertension in her father and mother; Stroke in her father and mother. ROS:   Please see the history of present illness.    All 14 point review of systems negative except as described per history of present illness  EKGs/Labs/Other Studies Reviewed:      Recent Labs: 12/03/2019: BUN 17; Hemoglobin 13.8; Platelets 340; Potassium 4.6; Sodium 137 02/15/2020: TSH 0.773 09/06/2020: Creatinine, Ser 0.70  Recent Lipid Panel No results found for: CHOL, TRIG, HDL, CHOLHDL, VLDL, LDLCALC, LDLDIRECT  Physical Exam:    VS:  BP 116/70 (BP Location: Right Arm)   Pulse 78   Ht 5' 2"  (1.575 m)   Wt 197 lb (89.4 kg)   SpO2 94%   BMI 36.03 kg/m     Wt Readings from Last 3 Encounters:  11/03/20 197 lb (89.4 kg)  05/11/20 192 lb (87.1 kg)  02/15/20 188 lb 9.6 oz (85.5 kg)     GEN:  Well nourished, well developed in no acute distress HEENT: Normal NECK: No JVD; No carotid bruits LYMPHATICS: No lymphadenopathy CARDIAC:  RRR, no murmurs, no rubs, no gallops RESPIRATORY:  Clear to auscultation without rales, wheezing or rhonchi  ABDOMEN: Soft, non-tender, non-distended MUSCULOSKELETAL:  No edema; No deformity  SKIN: Warm and dry LOWER EXTREMITIES: no swelling NEUROLOGIC:  Alert and oriented x 3 PSYCHIATRIC:  Normal affect   ASSESSMENT:    1. Mixed hyperlipidemia   2. Nonspecific abnormal electrocardiogram (ECG) (EKG)   3. Dyspnea on exertion    PLAN:    In order of problems listed above:  Mixed dyslipidemia.  I did review her fasting lipid profile from February of this year LDL of 129 HDL 50.  She is on pravastatin.  We will continue that medication we will consider increasing the dose. Nonspecific changes on the EKG.  She does have previously poor our progression with anterior precordium suggesting anterior septal wall MI but echocardiogram after that showed no segmental wall motion abnormalities.  At this time we do have also changes in inferior wall she does have Q waves in lead III and aVF.  I will repeat echocardiogram to recheck segmental motion abnormalities. Dyspnea on exertion portion of it is deconditioning.  However I will repeat echocardiogram to recheck left ventricular ejection fraction   Medication Adjustments/Labs and Tests Ordered: Current medicines are reviewed at length with the patient today.  Concerns regarding medicines are outlined above.  No orders of the defined types were placed in this encounter.  Medication changes: No orders of the defined types were placed in this encounter.   Signed, Park Liter, MD, [facility] 11/03/2020 [facility]

## 2020-11-03 NOTE — Patient Instructions (Signed)
Medication Instructions:  Your physician recommends that you continue on your current medications as directed. Please refer to the Current Medication list given to you today.  *If you need a refill on your cardiac medications before your next appointment, please call your pharmacy*   Lab Work:  If you have labs (blood work) drawn today and your tests are completely normal, you will receive your results only by: [place] (if you have MyChart) OR A paper copy in the mail If you have any lab test that is abnormal or we need to change your treatment, we will call you to review the results.   Testing/Procedures: Your physician has requested that you have an echocardiogram. Echocardiography is a painless test that uses sound waves to create images of your heart. It provides your doctor with information about the size and shape of your heart and how well your heart's chambers and valves are working. This procedure takes approximately one hour. There are no restrictions for this procedure.    Follow-Up: At [facility], you and your health needs are our priority.  As part of our continuing mission to provide you with exceptional heart care, we have created designated Provider Care Teams.  These Care Teams include your primary Cardiologist (physician) and Advanced Practice Providers (APPs -  Physician Assistants and Nurse Practitioners) who all work together to provide you with the care you need, when you need it.  We recommend signing up for the patient portal called "MyChart".  Sign up information is provided on this After Visit Summary.  MyChart is used to connect with patients for Virtual Visits (Telemedicine).  Patients are able to view lab/test results, encounter notes, upcoming appointments, etc.  Non-urgent messages can be sent to your provider as well.   To learn more about what you can do with MyChart, go to [email or web address].    Your next appointment:   12 month(s)  The  format for your next appointment:   In Person  Provider:   Jenne Campus, MD   Other Instructions

## 2020-11-07 NOTE — Progress Notes (Signed)
Follow-up Note: Gyn-Onc  Kimberly GEISSINGER 75 y.o. female  CC:  Chief Complaint  Patient presents with   Endometrial cancer [facility])    Assessment/Plan:  75 year old with stage IA grade 1 endometrioid endometrial adenocarcinoma s/p surgical staging on 01/16/16.  Her endometrial biopsy (originally read as high grade serous) has been reviewed and felt to be more consistent with moderately differentiated endometrial cancer.  Therefore, given the dominant grade 1 disease in her hystectomy specimen, she was felt to have a low risk cancer.  Pathology revealed low risk factors for recurrence, therefore no adjuvant therapy was recommended according to NCCN guidelines.  She has completed 5 years of surveillance with no recurrence of her cancer. Therefore we will suspend scheduled surveillance visits. However she was counseled regarding symptoms of recurrence and will notify our office of these in the future should they develop. She will be seen in the future on a prn basis.   HPI: Patient was originally seen on 01/10/16 in consultation at the request of Dr. Pamala Hurry.   Patient is a very pleasant 75 year old gravida 1 para 1 who has a long history of renal stones and has been seen by urology. She presented to urology and was seen by Dr. Alinda Money on August 30 for complaints of gross hematuria and right lower quadrant pain. However at that time she was noted to have vaginal bleeding and her bladder catheterization revealed no blood within the bladder. She was seen by Dr. Valentino Saxon on 12/29/2015. At that time an endometrial biopsy was performed as well as a Pap smear. Pap smear was unremarkable. 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 Prior to this episode of bleeding she had not had any menses for approximately 16 years.  On 01/16/16 she underwent robotic assisted total hysterectomy, BSO and pelvic lymphadenectomy. Final pathology revealed stage IA grade 1 endometrial cancer with inner  half myometrial invasion and no LVSI. She was noted to have low risk factors of final pathology and therefore no adjuvant therapy was recommended in accordance with NCCN guidelines.  Interval Hx:  She presents today for routine follow-up.  She has been diagnosed with a pituitary tumor (treated medically) and S1 neuropathy. She had rocky [place] spotted fever diagnosed in February, 2021 with persistent fatigue after that.   No symptoms of recurrence of her endometrial cancer.    Review of Systems  Constitutional:  Denies fever. Skin: No rash, + hirsuitism Cardiovascular: No chest pain, shortness of breath except as above, or edema  Pulmonary: No cough  Gastro Intestinal: Slight nausea, vomiting, constipation, or diarrhea reported. No change in bowel movement.  Genitourinary: No frequency, urgency, or dysuria.  no vaginal bleeding, no discharge.  Musculoskeletal: + Low back pain and joint pain in her knees.  Neurologic: No weakness, numbness, or change in gait.  Psychology: Anxious  Current Meds:  Outpatient Encounter Medications as of 11/08/2020  Medication Sig   ALPRAZolam (XANAX) 0.25 MG tablet Take 0.5 mg by mouth 3 (three) times daily as needed for anxiety.    amitriptyline (ELAVIL) 75 MG tablet Take 1 tablet (75 mg total) by mouth at bedtime.   aspirin 81 MG EC tablet Chew 1 tablet by mouth daily.   cabergoline (DOSTINEX) 0.5 MG tablet Take 0.5 tablets (0.25 mg total) by mouth See admin instructions. Patient takes tablets 1 mg on Tuesday and friday (Patient taking differently: Take 1 mg by mouth See admin instructions. Patient takes tablets 1 mg on Tuesday and friday)   Cholecalciferol (VITAMIN D3) 125  MCG (5000 UT) CAPS Take 1 capsule by mouth daily. (Patient not taking: Reported on 11/03/2020)   docusate sodium (COLACE) 50 MG capsule Take 50 mg by mouth at bedtime.    fluticasone (FLONASE) 50 MCG/ACT nasal spray Place 1 spray into both nostrils daily as needed for allergies.     levothyroxine (SYNTHROID) 25 MCG tablet Take 25 mcg by mouth daily before breakfast.    Magnesium 400 MG TABS Take 400 mg by mouth daily.   Melatonin 10 MG TABS Take 10 mg by mouth at bedtime.   metoprolol succinate (TOPROL-XL) 25 MG 24 hr tablet Take 25 mg by mouth daily.    montelukast (SINGULAIR) 10 MG tablet Take 10 mg by mouth at bedtime.    Omega-3 Fatty Acids (FISH OIL) 1000 MG CAPS Take 1 capsule by mouth in the morning and at bedtime.    omeprazole (PRILOSEC) 20 MG capsule Take 1 capsule (20 mg total) by mouth daily. (Patient taking differently: Take 20 mg by mouth at bedtime.)   polyethylene glycol (MIRALAX / GLYCOLAX) 17 g packet Take 17 g by mouth 2 (two) times daily. (Patient taking differently: Take 17 g by mouth daily as needed for moderate constipation.)   pravastatin (PRAVACHOL) 40 MG tablet Take 1 tablet (40 mg total) by mouth at bedtime.   terbinafine (LAMISIL) 250 MG tablet Take 250 mg by mouth daily. (Patient not taking: Reported on 11/03/2020)   tiZANidine (ZANAFLEX) 2 MG tablet Take 1 tablet by mouth 3 (three) times daily as needed for muscle spasms.    traZODone (DESYREL) 150 MG tablet Take 150 mg by mouth at bedtime.    No facility-administered encounter medications on file as of 11/08/2020.    Allergy:  Allergies  Allergen Reactions   Amoxicillin-Pot Clavulanate Nausea Only    Other reaction(s): stomach upset   Bupropion Nausea Only    Other reaction(s): stomach upset    Social Hx:   Social History   Socioeconomic History   Marital status: Divorced    Spouse name: Not on file   Number of children: 1   Years of education: 12   Highest education level: Not on file  Occupational History   Not on file  Tobacco Use   Smoking status: Never   Smokeless tobacco: Never  Vaping Use   Vaping Use: Never used  Substance and Sexual Activity   Alcohol use: No   Drug use: No   Sexual activity: Yes  Other Topics Concern   Not on file  Social History Narrative    Lives alone   Social Determinants of Health   Financial Resource Strain: Not on file  Food Insecurity: Not on file  Transportation Needs: Not on file  Physical Activity: Not on file  Stress: Not on file  Social Connections: Not on file  Intimate Partner Violence: Not on file    Past Surgical Hx:  Past Surgical History:  Procedure Laterality Date   BACK SURGERY  2013   CARPAL TUNNEL RELEASE     R hand   CYSTOSCOPY WITH URETEROSCOPY AND STENT PLACEMENT Bilateral 03/25/2014   Procedure: CYSTOSCOPY WITH URETEROSCOPY AND STENT PLACEMENT;  Surgeon: Raynelle Bring, MD;  Location: [facility];  Service: Urology;  Laterality: Bilateral;   CYSTOSCOPY WITH URETEROSCOPY AND STENT PLACEMENT Bilateral 04/18/2014   Procedure: CYSTOSCOPY WITH URETEROSCOPY AND STENT PLACEMENT,RETROGRADE;  Surgeon: Raynelle Bring, MD;  Location: [facility];  Service: Urology;  Laterality: Bilateral;   CYSTOSCOPY WITH URETEROSCOPY AND STENT PLACEMENT Right 05/30/2014  Procedure: CYSTOSCOPY WITH URETEROSCOPY AND STENT PLACEMENT;  Surgeon: Raynelle Bring, MD;  Location: [facility];  Service: Urology;  Laterality: Right;   CYSTOSCOPY/RETROGRADE/URETEROSCOPY Left 05/30/2014   Procedure: LEFT RETROGRADE;  Surgeon: Raynelle Bring, MD;  Location: [facility];  Service: Urology;  Laterality: Left;   CYSTOSCOPY/URETEROSCOPY/HOLMIUM LASER/STENT PLACEMENT Left 11/25/2019   Procedure: CYSTOSCOPY/RETROGRADE/URETEROSCOPY/HOLMIUM LASER/STENT PLACEMENT;  Surgeon: Raynelle Bring, MD;  Location: [facility];  Service: Urology;  Laterality: Left;   EYE SURGERY     cataract surgery bil   GANGLION CYST EXCISION     L ankle   HAMMERTOE RECONSTRUCTION WITH WEIL OSTEOTOMY Left 09/05/2016   Procedure: Second Metatarsal Weil Osteotomy and Hammertoe Correction;  Surgeon: Wylene Simmer, MD;  Location: [place];  Service: Orthopedics;  Laterality: Left;   HOLMIUM LASER APPLICATION Bilateral 86/76/7209   Procedure: HOLMIUM LASER APPLICATION;  Surgeon: Raynelle Bring, MD;  Location: [facility];  Service: Urology;  Laterality: Bilateral;   JOINT REPLACEMENT     total knee   LITHOTRIPSY     x 3   LUMBAR FUSION  09/2011   METATARSAL OSTEOTOMY WITH BUNIONECTOMY Left 09/05/2016   Procedure: Left First Metatarsal Scarf Osteotomy, Modified McBride Bunion Correction;  Surgeon: Wylene Simmer, MD;  Location: [place];  Service: Orthopedics;  Laterality: Left;   RHINOPLASTY     ROBOTIC ASSISTED TOTAL HYSTERECTOMY WITH BILATERAL SALPINGO OOPHERECTOMY Bilateral 01/16/2016   Procedure: XI ROBOTIC ASSISTED TOTAL HYSTERECTOMY WITH BILATERAL SALPINGO OOPHORECTOMY AND BILATERAL PELVIC LYMPH NODE DISSECTION;  Surgeon: Everitt Amber, MD;  Location: [facility];  Service: Gynecology;  Laterality: Bilateral;   SPINAL CORD STIMULATOR INSERTION N/A 02/23/2014   Procedure: SPINAL CORD STIMULATOR PLACEMENT ;  Surgeon: Melina Schools, MD;  Location: [place];  Service: Orthopedics;  Laterality: N/A;   TONSILLECTOMY     TOTAL KNEE ARTHROPLASTY Right 08/31/2012   Procedure: RIGHT TOTAL KNEE ARTHROPLASTY;  Surgeon: Mauri Pole, MD;  Location: [facility];  Service: Orthopedics;  Laterality: Right;  with LMA   TOTAL KNEE ARTHROPLASTY Left 11/10/2018   Procedure: TOTAL KNEE ARTHROPLASTY;  Surgeon: Paralee Cancel, MD;  Location[facility];  Service: Orthopedics;  Laterality: Left;  70 mins   TUBAL LIGATION      Past Medical Hx:  Past Medical History:  Diagnosis Date   Acute postoperative respiratory failure ([place]) 03/25/2014   AKI (acute kidney injury) ([place]) 03/25/2014   Anesthesia of skin    Anxiety    Aortic atherosclerosis ([facility])    Arthritis    Back pain    Benign essential HTN 03/25/2014   Cancer ([place])    endometrial cancer   Chronic daily headache 10/23/2017   Chronic low back pain 07/30/2017   Chronic pain syndrome 02/23/2014   DDD (degenerative disc disease), lumbar    Degeneration of lumbar intervertebral disc 05/10/2019   Depression    Endometrial cancer ([place]) 01/10/2016    Expected blood loss anemia 09/02/2012   Fibromyalgia 09/08/2012   Gastroparesis    Genetic testing 12/17/2017   TumorNext [facility] + CancerNext was ordered. The CancerNext gene panel offered by [employer] includes sequencing and rearrangement analysis for the following 32 genes:   APC, ATM, BARD1, BMPR1A, BRCA1, BRCA2, BRIP1, CDH1, CDK4, CDKN2A, CHEK2, DICER1, EPCAM, GREM1, HOXB13MLH1, MRE11A, MSH2, MSH6, MUTYH, NBN, NF1, PALB2, PMS2, POLD1, POLE, PTEN, RAD50, RAD51D, SMAD4, SMARCA4, STK11, and TP53.   Ger   History of kidney stones    HLD (hyperlipidemia)    Hx of sepsis 02/2014   DUE  TO MULTIPLE KIDNEY STONES   Hyperlipemia    Hyperlipidemia 03/25/2014   Hypertension    HYPERTENSION, BENIGN SYSTEMIC 06/26/2006   Qualifier: Diagnosis of  By: Herma Ard     Insomnia 0/35/4656   Lactic acidosis 07/10/2017   Long-term current use of opiate analgesic 05/28/2017   Lumbar post-laminectomy syndrome 07/30/2017   Near syncope 07/10/2017   NEPHROLITHIASIS 06/26/2006   Qualifier: Diagnosis of  By: Herma Ard     Obese 12/09/7515   Obesity (BMI 30-39.9) 09/02/2012   Obstructive uropathy 03/25/2014   Osteoporosis    Pain in left knee 01/08/2018   Pneumonia    2014   Prolactinoma ([place]) 07/16/2017   S/P left TKA 11/10/2018   S/P right TKA 08/31/2012   Severe sepsis with septic shock ([place]) 03/25/2014   UTI (urinary tract infection)     Oncology Hx:  Oncology History  Endometrial cancer ([place])  12/29/2015 Initial Diagnosis   Endometrial cancer ([place])     Genetic Testing   Patient has genetic testing done for mismatch repair protien. Results revealed patient has the following mutation(s): Abnormal: MSH6: loss of nuclear expression (less than 5% tumor expression).      Family Hx:  Family History  Problem Relation Age of Onset   Stroke Mother    Emphysema Mother    COPD Mother    Hypertension Mother    Stroke Father    Heart disease Father    Emphysema Father    Hypertension Father     Heart disease Brother     Vitals:  Blood pressure (!) 139/57, pulse 74, temperature 98.3 F (36.8 C), temperature source Tympanic, resp. rate 18, height _0  (1.575 m), weight 196 lb 8 oz (89.1 kg), SpO2 96 %.  Physical Exam: Well-nourished well-developed female in no acute distress.   Neck: Supple, no lymphadenopathy, no thyromegaly.  Lungs: no increased WOB  Cardiac: well perfused peripheries.   Abdomen: Obese, soft, nontender, nondistended. No palpable masses. There is no rebound, no guarding. No fluid wave. There is no hepatosplenomegaly. Incisions are soft.  Groins: No lymphadenopathy  Extremities: No edema  Pelvic: Normal female genitalia. The vagina is atrophic. Vaginal cuff normal. No blood or discharge. Cuff smooth, no masses palpable.    Thereasa Solo, MD 11/08/2020, 1:22 PM

## 2020-11-08 ENCOUNTER — Other Ambulatory Visit: Payer: Self-pay

## 2020-11-08 ENCOUNTER — Inpatient Hospital Stay: Payer: Medicare Other | Attending: Gynecologic Oncology | Admitting: Gynecologic Oncology

## 2020-11-08 VITALS — BP 139/57 | HR 74 | Temp 98.3°F | Resp 18 | Ht 62.0 in | Wt 196.5 lb

## 2020-11-08 DIAGNOSIS — Z90710 Acquired absence of both cervix and uterus: Secondary | ICD-10-CM | POA: Diagnosis not present

## 2020-11-08 DIAGNOSIS — E785 Hyperlipidemia, unspecified: Secondary | ICD-10-CM | POA: Insufficient documentation

## 2020-11-08 DIAGNOSIS — I10 Essential (primary) hypertension: Secondary | ICD-10-CM | POA: Insufficient documentation

## 2020-11-08 DIAGNOSIS — C541 Malignant neoplasm of endometrium: Secondary | ICD-10-CM

## 2020-11-08 DIAGNOSIS — M810 Age-related osteoporosis without current pathological fracture: Secondary | ICD-10-CM | POA: Diagnosis not present

## 2020-11-08 DIAGNOSIS — Z87442 Personal history of urinary calculi: Secondary | ICD-10-CM | POA: Insufficient documentation

## 2020-11-08 DIAGNOSIS — M5136 Other intervertebral disc degeneration, lumbar region: Secondary | ICD-10-CM | POA: Insufficient documentation

## 2020-11-08 DIAGNOSIS — Z90722 Acquired absence of ovaries, bilateral: Secondary | ICD-10-CM | POA: Insufficient documentation

## 2020-11-08 DIAGNOSIS — F32A Depression, unspecified: Secondary | ICD-10-CM | POA: Diagnosis not present

## 2020-11-08 DIAGNOSIS — Z79899 Other long term (current) drug therapy: Secondary | ICD-10-CM | POA: Insufficient documentation

## 2020-11-08 DIAGNOSIS — Z79891 Long term (current) use of opiate analgesic: Secondary | ICD-10-CM | POA: Diagnosis not present

## 2020-11-08 DIAGNOSIS — K3184 Gastroparesis: Secondary | ICD-10-CM | POA: Diagnosis not present

## 2020-11-08 DIAGNOSIS — Z7982 Long term (current) use of aspirin: Secondary | ICD-10-CM | POA: Insufficient documentation

## 2020-11-08 DIAGNOSIS — F419 Anxiety disorder, unspecified: Secondary | ICD-10-CM | POA: Insufficient documentation

## 2020-11-08 DIAGNOSIS — Z8542 Personal history of malignant neoplasm of other parts of uterus: Secondary | ICD-10-CM

## 2020-11-08 DIAGNOSIS — R519 Headache, unspecified: Secondary | ICD-10-CM | POA: Diagnosis not present

## 2020-11-08 DIAGNOSIS — M797 Fibromyalgia: Secondary | ICD-10-CM | POA: Diagnosis not present

## 2020-11-08 DIAGNOSIS — I700 Atherosclerosis of aorta: Secondary | ICD-10-CM | POA: Diagnosis not present

## 2020-11-08 DIAGNOSIS — G4700 Insomnia, unspecified: Secondary | ICD-10-CM | POA: Insufficient documentation

## 2020-11-08 NOTE — Patient Instructions (Signed)
Please notify Dr Denman George at phone number [phone] if you notice vaginal bleeding, new pelvic or abdominal pains, bloating, feeling full easy, or a change in bladder or bowel function.   You have completed 5 years of follow-up and no longer require scheduled follow-up at the cancer center.

## 2020-11-13 ENCOUNTER — Telehealth: Payer: Self-pay | Admitting: *Deleted

## 2020-11-13 NOTE — Telephone Encounter (Signed)
Patient called requesting refill request for amitriptyline 75 mg daily at bedtime.    Refill to [facility].  Routed to Dr Tammi Klippel in Dr Renda Rolls absence.

## 2020-11-19 MED ORDER — AMITRIPTYLINE HCL 75 MG PO TABS: 75.0000 mg | ORAL_TABLET | Freq: Every day | ORAL | 3 refills | Status: DC

## 2020-11-19 NOTE — Addendum Note (Signed)
Addended by: Tyler Pita on: 11/19/2020 12:46 PM   Modules accepted: Orders

## 2020-11-24 ENCOUNTER — Ambulatory Visit (INDEPENDENT_AMBULATORY_CARE_PROVIDER_SITE_OTHER): Payer: Medicare Other

## 2020-11-24 ENCOUNTER — Other Ambulatory Visit: Payer: Self-pay

## 2020-11-24 DIAGNOSIS — R0600 Dyspnea, unspecified: Secondary | ICD-10-CM

## 2020-11-24 DIAGNOSIS — E782 Mixed hyperlipidemia: Secondary | ICD-10-CM | POA: Diagnosis not present

## 2020-11-24 DIAGNOSIS — R9431 Abnormal electrocardiogram [ECG] [EKG]: Secondary | ICD-10-CM | POA: Diagnosis not present

## 2020-11-24 DIAGNOSIS — R0609 Other forms of dyspnea: Secondary | ICD-10-CM

## 2020-11-24 NOTE — Progress Notes (Signed)
Complete echocardiogram performed.  Jimmy Takyla Kuchera RDCS, RVT  

## 2020-11-26 LAB — ECHOCARDIOGRAM COMPLETE
Area-P 1/2: 6.9 cm2
S' Lateral: 2.7 cm

## 2020-11-27 DIAGNOSIS — D352 Benign neoplasm of pituitary gland: Secondary | ICD-10-CM | POA: Diagnosis not present

## 2020-12-08 DIAGNOSIS — L03115 Cellulitis of right lower limb: Secondary | ICD-10-CM | POA: Diagnosis not present

## 2020-12-10 DIAGNOSIS — Z5321 Procedure and treatment not carried out due to patient leaving prior to being seen by health care provider: Secondary | ICD-10-CM | POA: Diagnosis not present

## 2020-12-10 DIAGNOSIS — M79671 Pain in right foot: Secondary | ICD-10-CM | POA: Diagnosis not present

## 2020-12-10 DIAGNOSIS — M79672 Pain in left foot: Secondary | ICD-10-CM | POA: Diagnosis not present

## 2020-12-11 ENCOUNTER — Telehealth: Payer: Self-pay | Admitting: Cardiology

## 2020-12-11 DIAGNOSIS — M79671 Pain in right foot: Secondary | ICD-10-CM | POA: Diagnosis not present

## 2020-12-11 DIAGNOSIS — M7731 Calcaneal spur, right foot: Secondary | ICD-10-CM | POA: Diagnosis not present

## 2020-12-11 NOTE — Telephone Encounter (Signed)
   Pt is returning call for her echo results

## 2020-12-11 NOTE — Telephone Encounter (Signed)
Left message on patients voicemail to please return our call.   

## 2020-12-20 DIAGNOSIS — Z1231 Encounter for screening mammogram for malignant neoplasm of breast: Secondary | ICD-10-CM | POA: Diagnosis not present

## 2020-12-20 DIAGNOSIS — N393 Stress incontinence (female) (male): Secondary | ICD-10-CM | POA: Diagnosis not present

## 2020-12-20 DIAGNOSIS — Z1331 Encounter for screening for depression: Secondary | ICD-10-CM | POA: Diagnosis not present

## 2020-12-20 DIAGNOSIS — Z6836 Body mass index (BMI) 36.0-36.9, adult: Secondary | ICD-10-CM | POA: Diagnosis not present

## 2020-12-20 DIAGNOSIS — Z1339 Encounter for screening examination for other mental health and behavioral disorders: Secondary | ICD-10-CM | POA: Diagnosis not present

## 2020-12-20 DIAGNOSIS — Z0001 Encounter for general adult medical examination with abnormal findings: Secondary | ICD-10-CM | POA: Diagnosis not present

## 2020-12-20 DIAGNOSIS — D72828 Other elevated white blood cell count: Secondary | ICD-10-CM | POA: Diagnosis not present

## 2020-12-20 DIAGNOSIS — Z1159 Encounter for screening for other viral diseases: Secondary | ICD-10-CM | POA: Diagnosis not present

## 2020-12-20 DIAGNOSIS — E2839 Other primary ovarian failure: Secondary | ICD-10-CM | POA: Diagnosis not present

## 2020-12-20 DIAGNOSIS — W5501XA Bitten by cat, initial encounter: Secondary | ICD-10-CM | POA: Diagnosis not present

## 2020-12-27 DIAGNOSIS — M533 Sacrococcygeal disorders, not elsewhere classified: Secondary | ICD-10-CM | POA: Diagnosis not present

## 2021-01-07 DIAGNOSIS — M533 Sacrococcygeal disorders, not elsewhere classified: Secondary | ICD-10-CM | POA: Insufficient documentation

## 2021-01-24 DIAGNOSIS — M533 Sacrococcygeal disorders, not elsewhere classified: Secondary | ICD-10-CM | POA: Diagnosis not present

## 2021-01-28 IMAGING — CT CT HEAD WO/W CM
2 of 6 series · 14 of 47 positions shown, 17 images · IV contrast (omnipaque)
Comparison: 09/23/2018

CLINICAL DATA: Benign pituitary tumor.  Depression.  Leg weakness.

EXAM:
CT HEAD WITHOUT AND WITH CONTRAST
TECHNIQUE: Contiguous axial images were obtained from the base of the skull
through the vertex without and with intravenous contrast
CONTRAST:  75mL OMNIPAQUE IOHEXOL 300 MG/ML  SOLN

[Series 5: head w · axial · 0.47mm/px · z∈[+1485,+1613]mm · 11 of 148 slices shown, 14 images]
[im 10/148  brain]
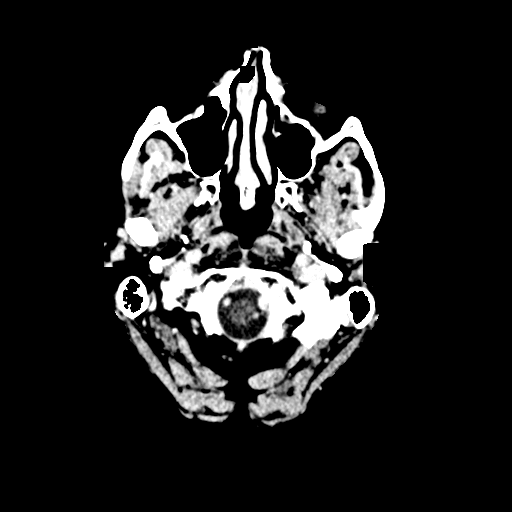
[im 10/148  bone]
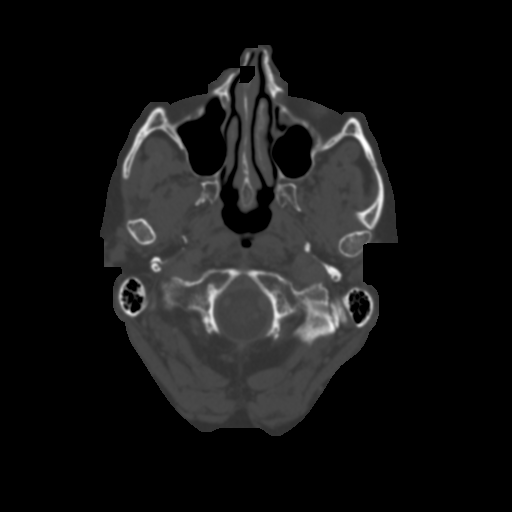
[im 20/148  brain]
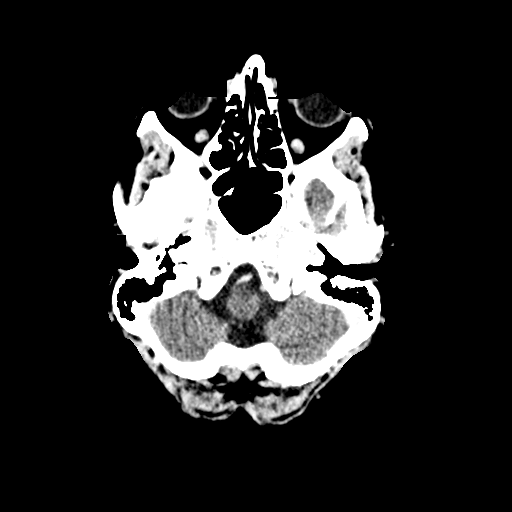
[im 40/148  brain]
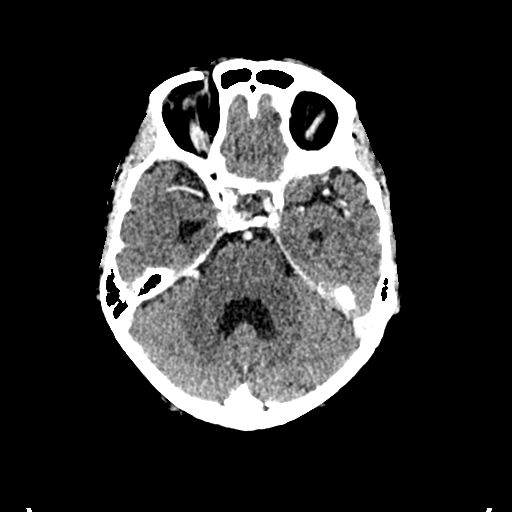
[im 50/148  brain]
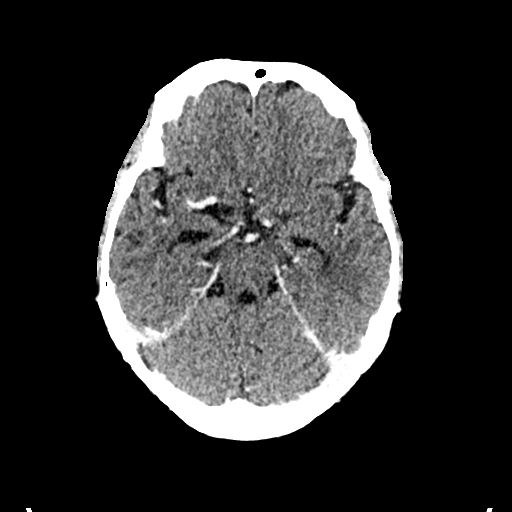
[im 59/148  brain]
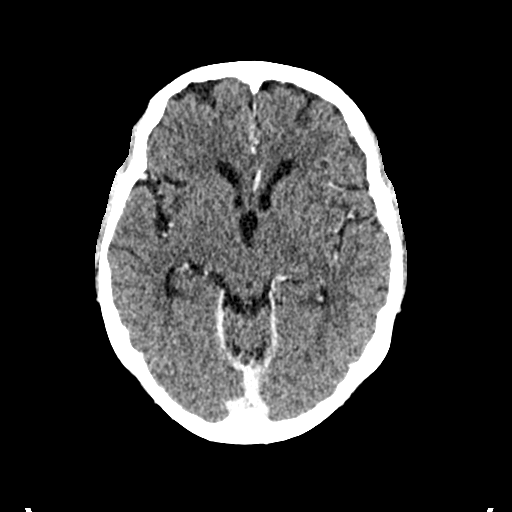
[im 59/148  bone]
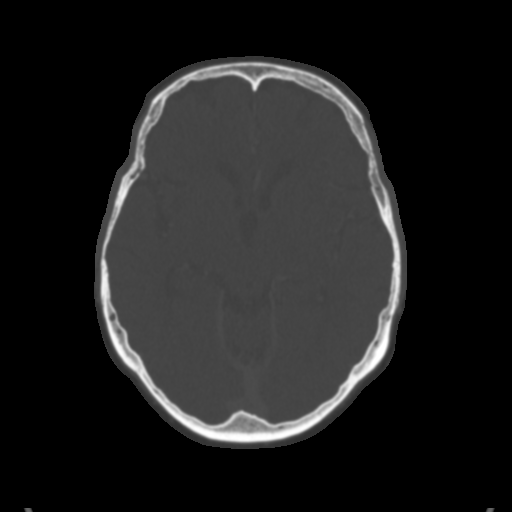
[im 79/148  brain]
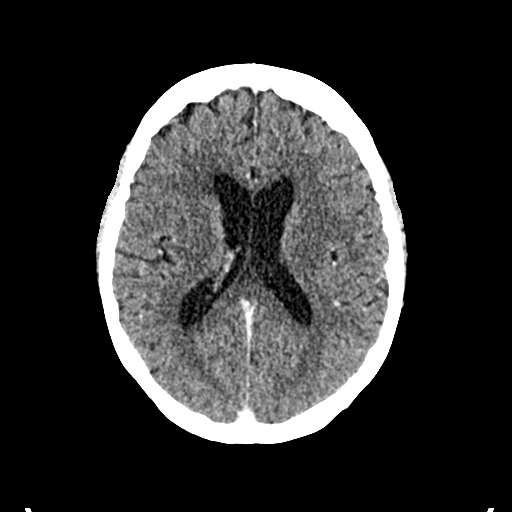
[im 89/148  brain]
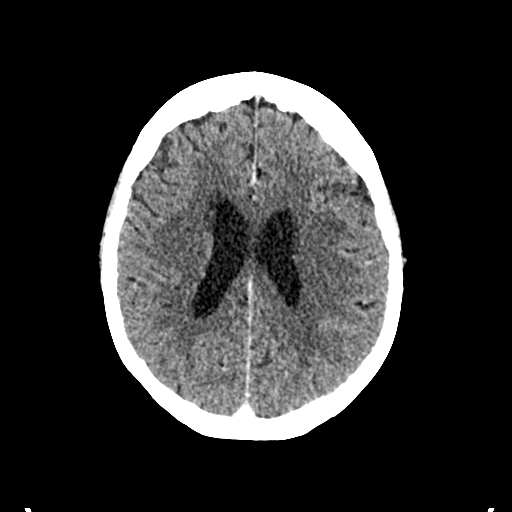
[im 99/148  brain]
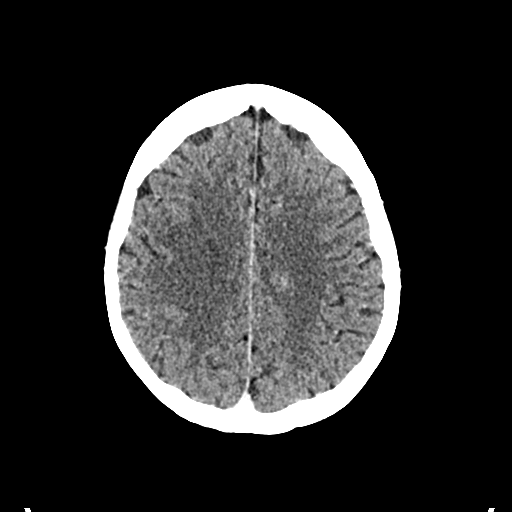
[im 108/148  brain]
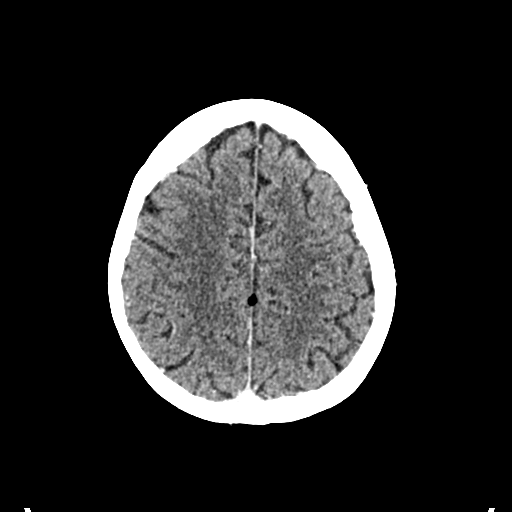
[im 108/148  bone]
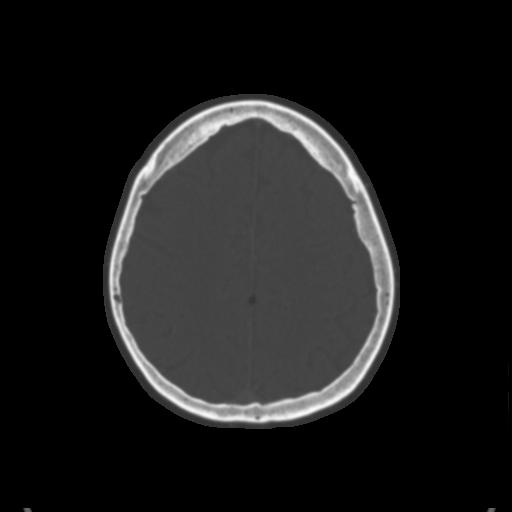
[im 128/148  brain]
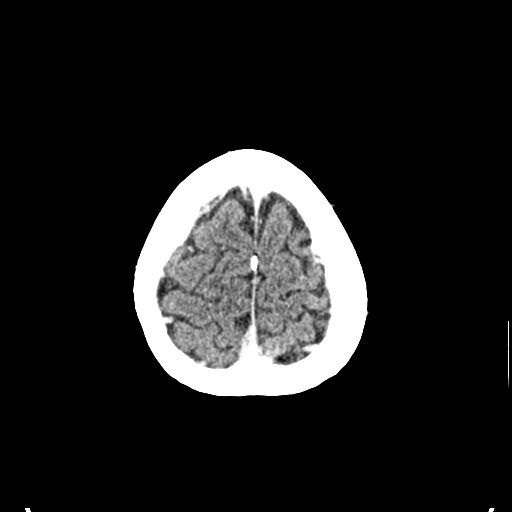
[im 138/148  brain]
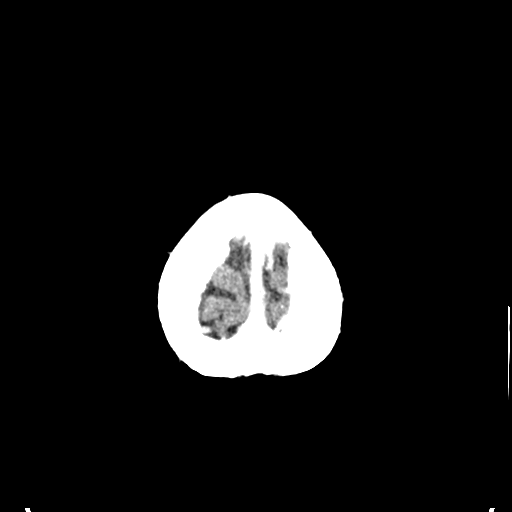

[Series 6: coronal soft tissue · coronal · 0.29mm/px · 3 of 64 slices shown]
[im 22/64  brain]
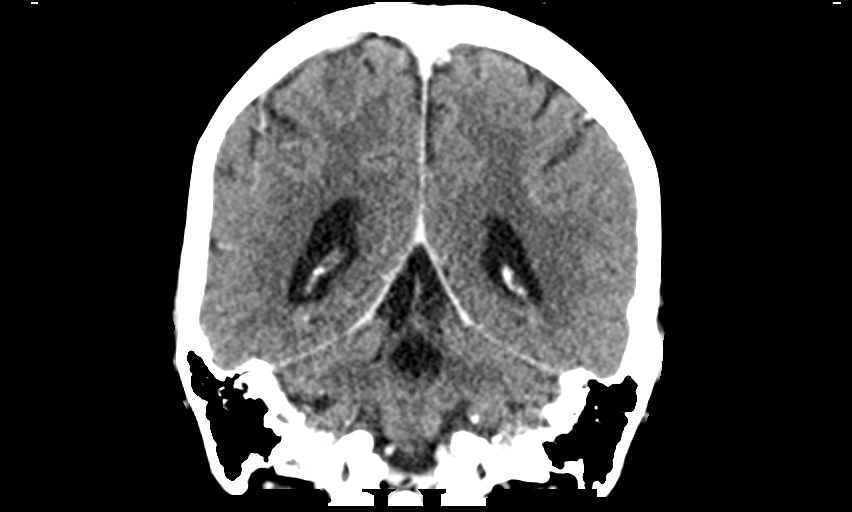
[im 29/64  brain]
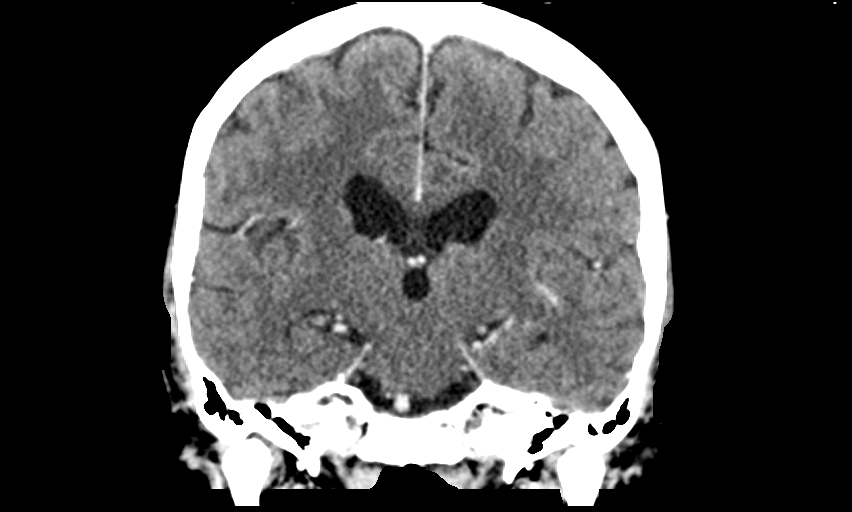
[im 36/64  brain]
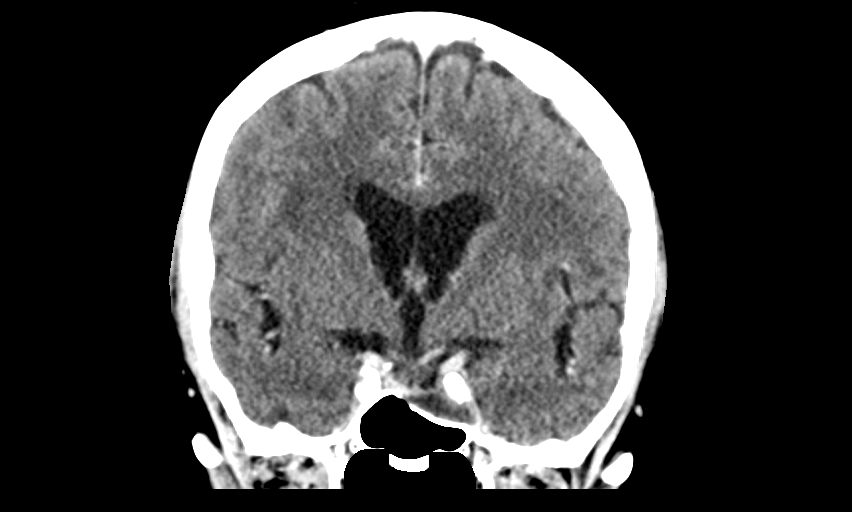

[14 of 47 positions shown; findings below may reference images not displayed]

FINDINGS: Brain: No change since the previous study. Brain parenchyma does not
show any acute finding. There is minimal small vessel change of the
hemispheric white matter. 8 mm colloid cyst of the anterior third
ventricle is again demonstrated without apparent change or
obstructive hydrocephalus. Ventricular size is normal. No
extra-axial collection.

Redemonstrated is a pituitary macro adenoma with left cavernous
sinus extension. Approximate measurements are unchanged at 27 x 12 x
21 mm. No suprasellar extension. Skull base remodeling is unchanged

Vascular: There is atherosclerotic calcification of the major
vessels at the base of the brain.

Skull: Negative

Sinuses/Orbits: Clear/normal

Other: None
IMPRESSION: No change. Pituitary tumor measuring approximately 27 x 12 x 21 mm
with left cavernous sinus extension and chronic skull base
remodeling.

No change in an 8 mm colloid cyst of the anterior third ventricle.

## 2021-01-31 DIAGNOSIS — Z23 Encounter for immunization: Secondary | ICD-10-CM | POA: Diagnosis not present

## 2021-02-13 DIAGNOSIS — G90529 Complex regional pain syndrome I of unspecified lower limb: Secondary | ICD-10-CM | POA: Insufficient documentation

## 2021-02-14 DIAGNOSIS — M545 Low back pain, unspecified: Secondary | ICD-10-CM | POA: Diagnosis not present

## 2021-02-14 DIAGNOSIS — G4700 Insomnia, unspecified: Secondary | ICD-10-CM | POA: Diagnosis not present

## 2021-02-22 DIAGNOSIS — G90522 Complex regional pain syndrome I of left lower limb: Secondary | ICD-10-CM | POA: Diagnosis not present

## 2021-02-27 DIAGNOSIS — G9050 Complex regional pain syndrome I, unspecified: Secondary | ICD-10-CM | POA: Diagnosis not present

## 2021-02-27 DIAGNOSIS — M79605 Pain in left leg: Secondary | ICD-10-CM | POA: Diagnosis not present

## 2021-02-27 DIAGNOSIS — M461 Sacroiliitis, not elsewhere classified: Secondary | ICD-10-CM | POA: Diagnosis not present

## 2021-03-06 DIAGNOSIS — R12 Heartburn: Secondary | ICD-10-CM | POA: Diagnosis not present

## 2021-03-06 DIAGNOSIS — R0989 Other specified symptoms and signs involving the circulatory and respiratory systems: Secondary | ICD-10-CM | POA: Diagnosis not present

## 2021-03-07 DIAGNOSIS — M461 Sacroiliitis, not elsewhere classified: Secondary | ICD-10-CM | POA: Diagnosis not present

## 2021-03-07 DIAGNOSIS — G9050 Complex regional pain syndrome I, unspecified: Secondary | ICD-10-CM | POA: Diagnosis not present

## 2021-03-07 DIAGNOSIS — M79605 Pain in left leg: Secondary | ICD-10-CM | POA: Diagnosis not present

## 2021-03-09 DIAGNOSIS — K5790 Diverticulosis of intestine, part unspecified, without perforation or abscess without bleeding: Secondary | ICD-10-CM | POA: Diagnosis not present

## 2021-03-09 DIAGNOSIS — G9050 Complex regional pain syndrome I, unspecified: Secondary | ICD-10-CM | POA: Diagnosis not present

## 2021-03-09 DIAGNOSIS — M79605 Pain in left leg: Secondary | ICD-10-CM | POA: Diagnosis not present

## 2021-03-09 DIAGNOSIS — J342 Deviated nasal septum: Secondary | ICD-10-CM | POA: Diagnosis not present

## 2021-03-09 DIAGNOSIS — M461 Sacroiliitis, not elsewhere classified: Secondary | ICD-10-CM | POA: Diagnosis not present

## 2021-03-09 DIAGNOSIS — K219 Gastro-esophageal reflux disease without esophagitis: Secondary | ICD-10-CM | POA: Diagnosis not present

## 2021-03-09 DIAGNOSIS — K117 Disturbances of salivary secretion: Secondary | ICD-10-CM | POA: Diagnosis not present

## 2021-03-09 DIAGNOSIS — R0989 Other specified symptoms and signs involving the circulatory and respiratory systems: Secondary | ICD-10-CM | POA: Diagnosis not present

## 2021-03-12 DIAGNOSIS — Z86010 Personal history of colonic polyps: Secondary | ICD-10-CM | POA: Diagnosis not present

## 2021-03-12 DIAGNOSIS — Z1211 Encounter for screening for malignant neoplasm of colon: Secondary | ICD-10-CM | POA: Diagnosis not present

## 2021-03-12 DIAGNOSIS — K635 Polyp of colon: Secondary | ICD-10-CM | POA: Diagnosis not present

## 2021-03-12 DIAGNOSIS — D124 Benign neoplasm of descending colon: Secondary | ICD-10-CM | POA: Diagnosis not present

## 2021-03-14 DIAGNOSIS — M461 Sacroiliitis, not elsewhere classified: Secondary | ICD-10-CM | POA: Diagnosis not present

## 2021-03-14 DIAGNOSIS — M79605 Pain in left leg: Secondary | ICD-10-CM | POA: Diagnosis not present

## 2021-03-14 DIAGNOSIS — G9050 Complex regional pain syndrome I, unspecified: Secondary | ICD-10-CM | POA: Diagnosis not present

## 2021-03-15 DIAGNOSIS — R300 Dysuria: Secondary | ICD-10-CM | POA: Diagnosis not present

## 2021-03-15 DIAGNOSIS — N3001 Acute cystitis with hematuria: Secondary | ICD-10-CM | POA: Diagnosis not present

## 2021-03-20 DIAGNOSIS — M461 Sacroiliitis, not elsewhere classified: Secondary | ICD-10-CM | POA: Diagnosis not present

## 2021-03-20 DIAGNOSIS — G9050 Complex regional pain syndrome I, unspecified: Secondary | ICD-10-CM | POA: Diagnosis not present

## 2021-03-20 DIAGNOSIS — M79605 Pain in left leg: Secondary | ICD-10-CM | POA: Diagnosis not present

## 2021-03-21 DIAGNOSIS — G9050 Complex regional pain syndrome I, unspecified: Secondary | ICD-10-CM | POA: Diagnosis not present

## 2021-03-21 DIAGNOSIS — M79605 Pain in left leg: Secondary | ICD-10-CM | POA: Diagnosis not present

## 2021-03-21 DIAGNOSIS — M461 Sacroiliitis, not elsewhere classified: Secondary | ICD-10-CM | POA: Diagnosis not present

## 2021-03-27 DIAGNOSIS — M79605 Pain in left leg: Secondary | ICD-10-CM | POA: Diagnosis not present

## 2021-03-27 DIAGNOSIS — G9050 Complex regional pain syndrome I, unspecified: Secondary | ICD-10-CM | POA: Diagnosis not present

## 2021-03-27 DIAGNOSIS — M461 Sacroiliitis, not elsewhere classified: Secondary | ICD-10-CM | POA: Diagnosis not present

## 2021-03-30 DIAGNOSIS — M461 Sacroiliitis, not elsewhere classified: Secondary | ICD-10-CM | POA: Diagnosis not present

## 2021-03-30 DIAGNOSIS — M79605 Pain in left leg: Secondary | ICD-10-CM | POA: Diagnosis not present

## 2021-03-30 DIAGNOSIS — G9050 Complex regional pain syndrome I, unspecified: Secondary | ICD-10-CM | POA: Diagnosis not present

## 2021-04-02 DIAGNOSIS — M461 Sacroiliitis, not elsewhere classified: Secondary | ICD-10-CM | POA: Diagnosis not present

## 2021-04-02 DIAGNOSIS — Z23 Encounter for immunization: Secondary | ICD-10-CM | POA: Diagnosis not present

## 2021-04-02 DIAGNOSIS — G9050 Complex regional pain syndrome I, unspecified: Secondary | ICD-10-CM | POA: Diagnosis not present

## 2021-04-02 DIAGNOSIS — M79605 Pain in left leg: Secondary | ICD-10-CM | POA: Diagnosis not present

## 2021-04-03 DIAGNOSIS — D485 Neoplasm of uncertain behavior of skin: Secondary | ICD-10-CM | POA: Diagnosis not present

## 2021-04-06 DIAGNOSIS — G9050 Complex regional pain syndrome I, unspecified: Secondary | ICD-10-CM | POA: Diagnosis not present

## 2021-04-06 DIAGNOSIS — M79605 Pain in left leg: Secondary | ICD-10-CM | POA: Diagnosis not present

## 2021-04-06 DIAGNOSIS — M461 Sacroiliitis, not elsewhere classified: Secondary | ICD-10-CM | POA: Diagnosis not present

## 2021-04-10 DIAGNOSIS — G9050 Complex regional pain syndrome I, unspecified: Secondary | ICD-10-CM | POA: Diagnosis not present

## 2021-04-10 DIAGNOSIS — M79605 Pain in left leg: Secondary | ICD-10-CM | POA: Diagnosis not present

## 2021-04-10 DIAGNOSIS — M461 Sacroiliitis, not elsewhere classified: Secondary | ICD-10-CM | POA: Diagnosis not present

## 2021-04-13 DIAGNOSIS — G9050 Complex regional pain syndrome I, unspecified: Secondary | ICD-10-CM | POA: Diagnosis not present

## 2021-04-13 DIAGNOSIS — M461 Sacroiliitis, not elsewhere classified: Secondary | ICD-10-CM | POA: Diagnosis not present

## 2021-04-13 DIAGNOSIS — M79605 Pain in left leg: Secondary | ICD-10-CM | POA: Diagnosis not present

## 2021-05-10 DIAGNOSIS — H04123 Dry eye syndrome of bilateral lacrimal glands: Secondary | ICD-10-CM | POA: Diagnosis not present

## 2021-05-10 DIAGNOSIS — H25813 Combined forms of age-related cataract, bilateral: Secondary | ICD-10-CM | POA: Diagnosis not present

## 2021-05-10 DIAGNOSIS — H53461 Homonymous bilateral field defects, right side: Secondary | ICD-10-CM | POA: Diagnosis not present

## 2021-05-21 DIAGNOSIS — E038 Other specified hypothyroidism: Secondary | ICD-10-CM | POA: Diagnosis not present

## 2021-05-21 DIAGNOSIS — D352 Benign neoplasm of pituitary gland: Secondary | ICD-10-CM | POA: Diagnosis not present

## 2021-05-21 DIAGNOSIS — E221 Hyperprolactinemia: Secondary | ICD-10-CM | POA: Diagnosis not present

## 2021-05-29 DIAGNOSIS — E2839 Other primary ovarian failure: Secondary | ICD-10-CM | POA: Diagnosis not present

## 2021-05-29 DIAGNOSIS — M85851 Other specified disorders of bone density and structure, right thigh: Secondary | ICD-10-CM | POA: Diagnosis not present

## 2021-06-10 ENCOUNTER — Other Ambulatory Visit: Payer: Self-pay | Admitting: Radiation Oncology

## 2021-06-18 ENCOUNTER — Telehealth: Payer: Self-pay | Admitting: *Deleted

## 2021-06-18 ENCOUNTER — Other Ambulatory Visit: Payer: Self-pay | Admitting: Internal Medicine

## 2021-06-18 MED ORDER — AMITRIPTYLINE HCL 75 MG PO TABS: 75.0000 mg | ORAL_TABLET | Freq: Every day | ORAL | 3 refills | Status: DC

## 2021-06-18 NOTE — Telephone Encounter (Signed)
Patient is requesting refill of Amitriptyline 90 day supply to [facility] on file.  Routing to Dr Mickeal Skinner

## 2021-06-20 DIAGNOSIS — Z6837 Body mass index (BMI) 37.0-37.9, adult: Secondary | ICD-10-CM | POA: Diagnosis not present

## 2021-06-20 DIAGNOSIS — K219 Gastro-esophageal reflux disease without esophagitis: Secondary | ICD-10-CM | POA: Diagnosis not present

## 2021-06-20 DIAGNOSIS — I10 Essential (primary) hypertension: Secondary | ICD-10-CM | POA: Diagnosis not present

## 2021-06-20 DIAGNOSIS — Z79899 Other long term (current) drug therapy: Secondary | ICD-10-CM | POA: Diagnosis not present

## 2021-06-20 DIAGNOSIS — R5383 Other fatigue: Secondary | ICD-10-CM | POA: Diagnosis not present

## 2021-06-20 DIAGNOSIS — E039 Hypothyroidism, unspecified: Secondary | ICD-10-CM | POA: Diagnosis not present

## 2021-07-05 ENCOUNTER — Telehealth: Payer: Self-pay

## 2021-07-05 NOTE — Telephone Encounter (Signed)
Patient called. She is unable to afford her amitriptyline. I called her pharmacy and it is $23.97 per month up from $3.00 per month.  Encouraged pt to call her insurance which she does not want to do. She is asking if she can be weaned off medication. ?

## 2021-07-11 DIAGNOSIS — R748 Abnormal levels of other serum enzymes: Secondary | ICD-10-CM | POA: Diagnosis not present

## 2021-07-11 DIAGNOSIS — Z1231 Encounter for screening mammogram for malignant neoplasm of breast: Secondary | ICD-10-CM | POA: Diagnosis not present

## 2021-09-19 DIAGNOSIS — N200 Calculus of kidney: Secondary | ICD-10-CM | POA: Diagnosis not present

## 2021-09-20 ENCOUNTER — Other Ambulatory Visit: Payer: Self-pay | Admitting: Radiation Therapy

## 2021-09-27 ENCOUNTER — Telehealth: Payer: Self-pay | Admitting: Internal Medicine

## 2021-09-27 NOTE — Telephone Encounter (Signed)
Called patient regarding upcoming appointments, left a voicemail. 

## 2021-09-28 ENCOUNTER — Ambulatory Visit (HOSPITAL_COMMUNITY)
Admission: RE | Admit: 2021-09-28 | Discharge: 2021-09-28 | Disposition: A | Discharge status: Home / Self Care | Payer: Medicare Other | Source: Ambulatory Visit | Attending: Internal Medicine | Admitting: Internal Medicine

## 2021-09-28 DIAGNOSIS — D352 Benign neoplasm of pituitary gland: Secondary | ICD-10-CM | POA: Diagnosis not present

## 2021-09-28 DIAGNOSIS — Q046 Congenital cerebral cysts: Secondary | ICD-10-CM | POA: Diagnosis not present

## 2021-09-28 DIAGNOSIS — C719 Malignant neoplasm of brain, unspecified: Secondary | ICD-10-CM | POA: Diagnosis not present

## 2021-09-28 MED ORDER — SODIUM CHLORIDE (PF) 0.9 % IJ SOLN
INTRAMUSCULAR | Status: AC
  Filled 2021-09-28: qty 50

## 2021-09-28 MED ORDER — IOHEXOL 300 MG/ML  SOLN
75.0000 mL | Freq: Once | INTRAMUSCULAR | Status: AC | PRN
  Administered 2021-09-28: 75 mL via INTRAVENOUS

## 2021-10-01 ENCOUNTER — Inpatient Hospital Stay: Payer: Medicare Other | Attending: Internal Medicine

## 2021-10-01 DIAGNOSIS — D352 Benign neoplasm of pituitary gland: Secondary | ICD-10-CM | POA: Insufficient documentation

## 2021-10-01 DIAGNOSIS — Z79899 Other long term (current) drug therapy: Secondary | ICD-10-CM | POA: Insufficient documentation

## 2021-10-03 DIAGNOSIS — K219 Gastro-esophageal reflux disease without esophagitis: Secondary | ICD-10-CM | POA: Diagnosis not present

## 2021-10-03 DIAGNOSIS — E039 Hypothyroidism, unspecified: Secondary | ICD-10-CM | POA: Diagnosis not present

## 2021-10-03 DIAGNOSIS — I10 Essential (primary) hypertension: Secondary | ICD-10-CM | POA: Diagnosis not present

## 2021-10-03 DIAGNOSIS — K5900 Constipation, unspecified: Secondary | ICD-10-CM | POA: Diagnosis not present

## 2021-10-04 ENCOUNTER — Inpatient Hospital Stay (HOSPITAL_BASED_OUTPATIENT_CLINIC_OR_DEPARTMENT_OTHER): Payer: Medicare Other | Admitting: Internal Medicine

## 2021-10-04 ENCOUNTER — Other Ambulatory Visit: Payer: Self-pay

## 2021-10-04 VITALS — BP 149/73 | HR 88 | Temp 97.5°F | Resp 18 | Ht 62.0 in | Wt 200.4 lb

## 2021-10-04 DIAGNOSIS — Z79899 Other long term (current) drug therapy: Secondary | ICD-10-CM | POA: Diagnosis not present

## 2021-10-04 DIAGNOSIS — D352 Benign neoplasm of pituitary gland: Secondary | ICD-10-CM

## 2021-10-04 NOTE — Progress Notes (Signed)
[facility] [place], [place] [address] [phone]   Interval Evaluation  Date of Service: 10/04/21 Patient Name: Kimberly Larsen Patient MRN: [id number] Patient DOB: 1946/03/19 Provider: Ventura Sellers, MD  Identifying Statement:  Kimberly Larsen is a 76 y.o. female with  sellar   macroadenoma    Interval History:  Kimberly Larsen presents today after recent CT head.  She continues to take cabergoline through Dr. Buddy Duty.  No new or progressive symptoms today.  Also still struggling with depression symptoms.  Medications: Current Outpatient Medications on File Prior to Visit  Medication Sig Dispense Refill   ALPRAZolam (XANAX) 0.25 MG tablet Take 0.5 mg by mouth 3 (three) times daily as needed for anxiety.      amitriptyline (ELAVIL) 75 MG tablet Take 1 tablet (75 mg total) by mouth at bedtime. 30 tablet 3   aspirin 81 MG EC tablet Chew 1 tablet by mouth daily.     cabergoline (DOSTINEX) 0.5 MG tablet Take 0.5 tablets (0.25 mg total) by mouth See admin instructions. Patient takes tablets 1 mg on Tuesday and friday (Patient taking differently: Take 1 mg by mouth See admin instructions. Patient takes tablets 1 mg on Tuesday and friday) 10 tablet    docusate sodium (COLACE) 50 MG capsule Take 50 mg by mouth at bedtime.      fluticasone (FLONASE) 50 MCG/ACT nasal spray Place 1 spray into both nostrils daily as needed for allergies.      levothyroxine (SYNTHROID) 25 MCG tablet Take 25 mcg by mouth daily before breakfast.      Magnesium 400 MG TABS Take 400 mg by mouth daily.     Melatonin 10 MG TABS Take 10 mg by mouth at bedtime.     metoprolol succinate (TOPROL-XL) 25 MG 24 hr tablet Take 25 mg by mouth daily.   0   montelukast (SINGULAIR) 10 MG tablet Take 10 mg by mouth at bedtime.   1   Omega-3 Fatty Acids (FISH OIL) 1000 MG CAPS Take 1 capsule by mouth in the morning and at bedtime.      pantoprazole (PROTONIX) 40 MG tablet Take 40 mg  by mouth daily.     polyethylene glycol (MIRALAX / GLYCOLAX) 17 g packet Take 17 g by mouth 2 (two) times daily. (Patient taking differently: Take 17 g by mouth daily as needed for moderate constipation.) 28 packet 0   pravastatin (PRAVACHOL) 40 MG tablet Take 1 tablet (40 mg total) by mouth at bedtime. 30 tablet 0   tiZANidine (ZANAFLEX) 2 MG tablet Take 1 tablet by mouth 3 (three) times daily as needed for muscle spasms.      traZODone (DESYREL) 150 MG tablet Take 150 mg by mouth at bedtime.      No current facility-administered medications on file prior to visit.    Allergies:  Allergies  Allergen Reactions   Amoxicillin-Pot Clavulanate Nausea Only    Other reaction(s): stomach upset   Bupropion Nausea Only    Other reaction(s): stomach upset   Past Medical History:  Past Medical History:  Diagnosis Date   Acute postoperative respiratory failure ([facility]) 03/25/2014   AKI (acute kidney injury) ([facility]) 03/25/2014   Anesthesia of skin    Anxiety    Aortic atherosclerosis ([facility])    Arthritis    Back pain    Benign essential HTN 03/25/2014   Cancer ([facility])    endometrial cancer   Chronic daily headache  10/23/2017   Chronic low back pain 07/30/2017   Chronic pain syndrome 02/23/2014   DDD (degenerative disc disease), lumbar    Degeneration of lumbar intervertebral disc 05/10/2019   Depression    Endometrial cancer ([facility]) 01/10/2016   Expected blood loss anemia 09/02/2012   Fibromyalgia 09/08/2012   Gastroparesis    Genetic testing 12/17/2017   TumorNext [facility] + CancerNext was ordered. The CancerNext gene panel offered by [employer] includes sequencing and rearrangement analysis for the following 32 genes:   APC, ATM, BARD1, BMPR1A, BRCA1, BRCA2, BRIP1, CDH1, CDK4, CDKN2A, CHEK2, DICER1, EPCAM, GREM1, HOXB13MLH1, MRE11A, MSH2, MSH6, MUTYH, NBN, NF1, PALB2, PMS2, POLD1, POLE, PTEN, RAD50, RAD51D, SMAD4, SMARCA4, STK11, and TP53.   Ger   History of kidney stones    HLD (hyperlipidemia)    Hx  of sepsis 02/2014   DUE TO MULTIPLE KIDNEY STONES   Hyperlipemia    Hyperlipidemia 03/25/2014   Hypertension    HYPERTENSION, BENIGN SYSTEMIC 06/26/2006   Qualifier: Diagnosis of  By: Herma Ard     Insomnia 7/34/1937   Lactic acidosis 07/10/2017   Long-term current use of opiate analgesic 05/28/2017   Lumbar post-laminectomy syndrome 07/30/2017   Near syncope 07/10/2017   NEPHROLITHIASIS 06/26/2006   Qualifier: Diagnosis of  By: Herma Ard     Obese 12/30/4095   Obesity (BMI 30-39.9) 09/02/2012   Obstructive uropathy 03/25/2014   Osteoporosis    Pain in left knee 01/08/2018   Pneumonia    2014   Prolactinoma ([facility]) 07/16/2017   S/P left TKA 11/10/2018   S/P right TKA 08/31/2012   Severe sepsis with septic shock ([facility]) 03/25/2014   UTI (urinary tract infection)    Past Surgical History:  Past Surgical History:  Procedure Laterality Date   BACK SURGERY  2013   CARPAL TUNNEL RELEASE     R hand   CYSTOSCOPY WITH URETEROSCOPY AND STENT PLACEMENT Bilateral 03/25/2014   Procedure: CYSTOSCOPY WITH URETEROSCOPY AND STENT PLACEMENT;  Surgeon: Raynelle Bring, MD;  Location: [facility];  Service: Urology;  Laterality: Bilateral;   CYSTOSCOPY WITH URETEROSCOPY AND STENT PLACEMENT Bilateral 04/18/2014   Procedure: CYSTOSCOPY WITH URETEROSCOPY AND STENT PLACEMENT,RETROGRADE;  Surgeon: Raynelle Bring, MD;  Location: [facility];  Service: Urology;  Laterality: Bilateral;   CYSTOSCOPY WITH URETEROSCOPY AND STENT PLACEMENT Right 05/30/2014   Procedure: CYSTOSCOPY WITH URETEROSCOPY AND STENT PLACEMENT;  Surgeon: Raynelle Bring, MD;  Location[facility];  Service: Urology;  Laterality: Right;   CYSTOSCOPY/RETROGRADE/URETEROSCOPY Left 05/30/2014   Procedure: LEFT RETROGRADE;  Surgeon: Raynelle Bring, MD;  Location: [facility];  Service: Urology;  Laterality: Left;   CYSTOSCOPY/URETEROSCOPY/HOLMIUM LASER/STENT PLACEMENT Left 11/25/2019   Procedure: CYSTOSCOPY/RETROGRADE/URETEROSCOPY/HOLMIUM LASER/STENT PLACEMENT;   Surgeon: Raynelle Bring, MD;  Location: [facility];  Service: Urology;  Laterality: Left;   EYE SURGERY     cataract surgery bil   GANGLION CYST EXCISION     L ankle   HAMMERTOE RECONSTRUCTION WITH WEIL OSTEOTOMY Left 09/05/2016   Procedure: Second Metatarsal Weil Osteotomy and Hammertoe Correction;  Surgeon: Wylene Simmer, MD;  Location: [facility];  Service: Orthopedics;  Laterality: Left;   HOLMIUM LASER APPLICATION Bilateral 35/32/9924   Procedure: HOLMIUM LASER APPLICATION;  Surgeon: Raynelle Bring, MD;  Location[facility];  Service: Urology;  Laterality: Bilateral;   JOINT REPLACEMENT     total knee   LITHOTRIPSY     x 3   LUMBAR FUSION  09/2011   METATARSAL OSTEOTOMY WITH BUNIONECTOMY Left 09/05/2016   Procedure: Left First Metatarsal  Scarf Osteotomy, Modified McBride Bunion Correction;  Surgeon: Wylene Simmer, MD;  Location: [facility];  Service: Orthopedics;  Laterality: Left;   RHINOPLASTY     ROBOTIC ASSISTED TOTAL HYSTERECTOMY WITH BILATERAL SALPINGO OOPHERECTOMY Bilateral 01/16/2016   Procedure: XI ROBOTIC ASSISTED TOTAL HYSTERECTOMY WITH BILATERAL SALPINGO OOPHORECTOMY AND BILATERAL PELVIC LYMPH NODE DISSECTION;  Surgeon: Everitt Amber, MD;  Location[facility];  Service: Gynecology;  Laterality: Bilateral;   SPINAL CORD STIMULATOR INSERTION N/A 02/23/2014   Procedure: SPINAL CORD STIMULATOR PLACEMENT ;  Surgeon: Melina Schools, MD;  Location: [facility];  Service: Orthopedics;  Laterality: N/A;   TONSILLECTOMY     TOTAL KNEE ARTHROPLASTY Right 08/31/2012   Procedure: RIGHT TOTAL KNEE ARTHROPLASTY;  Surgeon: Mauri Pole, MD;  [facility];  Service: Orthopedics;  Laterality: Right;  with LMA   TOTAL KNEE ARTHROPLASTY Left 11/10/2018   Procedure: TOTAL KNEE ARTHROPLASTY;  Surgeon: Paralee Cancel, MD;  [facility];  Service: Orthopedics;  Laterality: Left;  70 mins   TUBAL LIGATION     Social History:  Social History   Socioeconomic History   Marital status:  Divorced    Spouse name: Not on file   Number of children: 1   Years of education: 12   Highest education level: Not on file  Occupational History   Not on file  Tobacco Use   Smoking status: Never   Smokeless tobacco: Never  Vaping Use   Vaping Use: Never used  Substance and Sexual Activity   Alcohol use: No   Drug use: No   Sexual activity: Yes  Other Topics Concern   Not on file  Social History Narrative   Lives alone   Social Determinants of Health   Financial Resource Strain: Not on file  Food Insecurity: Not on file  Transportation Needs: Not on file  Physical Activity: Not on file  Stress: Not on file  Social Connections: Not on file  Intimate Partner Violence: Not on file   Family History:  Family History  Problem Relation Age of Onset   Stroke Mother    Emphysema Mother    COPD Mother    Hypertension Mother    Stroke Father    Heart disease Father    Emphysema Father    Hypertension Father    Heart disease Brother     Review of Systems: Constitutional: Denies fevers, chills or abnormal weight loss Eyes: Denies blurriness of vision Ears, nose, mouth, throat, and face: Denies mucositis or sore throat Respiratory: Denies cough, dyspnea or wheezes Cardiovascular: Denies palpitation, chest discomfort or lower extremity swelling Gastrointestinal:  Denies nausea, constipation, diarrhea GU: Denies dysuria or incontinence Skin: Denies abnormal skin rashes Neurological: Per HPI Musculoskeletal: Back pain, knee pain Behavioral/Psych: +anxiety  Physical Exam: Vitals:   10/04/21 1222  BP: (!) 149/73  Pulse: 88  Resp: 18  Temp: (!) 97.5 F (36.4 C)  SpO2: 95%   KPS: 90. General: Alert, cooperative, pleasant, in no acute distress Head: Craniotomy scar noted, dry and intact. EENT: No conjunctival injection or scleral icterus. Oral mucosa moist Lungs: Resp effort normal Cardiac: Regular rate and rhythm Abdomen: Soft, non-distended abdomen Skin: No  rashes cyanosis or petechiae. Extremities: No clubbing or edema  Neurologic Exam: Mental Status: Awake, alert, attentive to examiner. Oriented to self and environment. Language is fluent with intact comprehension.  Cranial Nerves: Visual acuity is grossly normal. Visual fields are full. Extra-ocular movements intact. No ptosis. Face is symmetric, tongue midline. Motor: Tone and  bulk are normal. Power is full in both arms and legs. Reflexes are symmetric, no pathologic reflexes present. Intact finger to nose bilaterally Sensory: Intact to light touch and temperature Gait: Normal and tandem gait is normal.   Labs: I have reviewed the data as listed    Component Value Date/Time   NA 137 12/03/2019 1603   NA 141 01/10/2016 1412   K 4.6 12/03/2019 1603   K 4.0 01/10/2016 1412   CL 99 12/03/2019 1603   CO2 26 12/03/2019 1603   CO2 30 (H) 01/10/2016 1412   GLUCOSE 114 (H) 12/03/2019 1603   GLUCOSE 102 01/10/2016 1412   BUN 17 12/03/2019 1603   BUN 19.3 01/10/2016 1412   CREATININE 0.70 09/06/2020 1048   CREATININE 0.91 10/01/2019 1132   CREATININE 0.9 01/10/2016 1412   CALCIUM 9.8 12/03/2019 1603   CALCIUM 9.8 01/10/2016 1412   PROT 6.2 02/15/2020 1519   ALBUMIN 3.7 10/01/2019 1132   AST 18 10/01/2019 1132   ALT 21 10/01/2019 1132   ALKPHOS 97 10/01/2019 1132   BILITOT 0.7 10/01/2019 1132   GFRNONAA 51 (L) 12/03/2019 1603   GFRNONAA >60 10/01/2019 1132   GFRAA 59 (L) 12/03/2019 1603   GFRAA >60 10/01/2019 1132   Lab Results  Component Value Date   WBC 10.8 (H) 12/03/2019   NEUTROABS 2.6 10/09/2018   HGB 13.8 12/03/2019   HCT 43.2 12/03/2019   MCV 98.9 12/03/2019   PLT 340 12/03/2019   Component     Latest Ref Rng & Units 07/12/2017  Triiodothyronine,Free,Serum     2.0 - 4.4 pg/mL 1.4 (L)  T4,Free(Direct)     0.61 - 1.12 ng/dL 0.60 (L)  FSH     mIU/mL 0.7  Somatomedin C     38 - 163 ng/mL 108  LH     mIU/mL <0.2  Prolactin     4.8 - 23.3 ng/mL 4,548.0 (H)  C206  ACTH     7.2 - 63.3 pg/mL 15.4    Imaging:  [facility] Clinician Interpretation: I have personally reviewed the CNS images as listed.  My interpretation, in the context of the patient's clinical presentation, is stable disease  CT HEAD W & WO CONTRAST  Result Date: 09/28/2021 CLINICAL DATA:  Brain/CNS neoplasm, assess treatment response EXAM: CT HEAD WITHOUT AND WITH CONTRAST TECHNIQUE: Contiguous axial images were obtained from the base of the skull through the vertex without and with intravenous contrast. RADIATION DOSE REDUCTION: This exam was performed according to the departmental dose-optimization program which includes automated exposure control, adjustment of the mA and/or kV according to patient size and/or use of iterative reconstruction technique. CONTRAST:  32m OMNIPAQUE IOHEXOL 300 MG/ML  SOLN COMPARISON:  May 2022 FINDINGS: Sella: Hypoenhancing lesion at the floor of the sella turcica on the left extending into the cavernous sinus. No significant change since the prior study. There is no suprasellar extension. Unchanged remodeling of the floor of the sella. Brain: There is no acute intracranial hemorrhage or edema. No mass or mass effect. No abnormal enhancement. Stable colloid cyst of the third ventricle at the foramen of Monro. Ventricles are stable in size without hydrocephalus. Gray-white differentiation is preserved. There is no extra-axial fluid collection. Patchy hypoattenuation in the supratentorial white matter is nonspecific but probably reflects stable chronic microvascular ischemic changes. Vascular: There is atherosclerotic calcification at the skull base. Skull: Calvarium is unremarkable. Sinuses/Orbits: No acute finding. Other: None. IMPRESSION: No substantial change in pituitary mass involving the left cavernous sinus. Stable  colloid cyst.  Stable caliber of ventricles. Electronically Signed   By: Macy Mis M.D.   On: 09/28/2021 14:27      Assessment/Plan 1. Pituitary  macroadenoma [facility])  Kimberly Larsen is clinically and radiographically stable today regarding her prolactinoma.  She should continue to take cabergoline via endocrinology.  We appreciate the opportunity to participate in the care of SERI KIMMER.   We ask that Kimberly Larsen return to clinic in 12 months following next brain CT, or sooner as needed.  All questions were answered. The patient knows to call the clinic with any problems, questions or concerns. No barriers to learning were detected.  The total time spent in the encounter was 30 minutes and more than 50% was on counseling and review of test results   Ventura Sellers, MD Medical Director of Neuro-Oncology [facility] 10/04/21 12:31 PM

## 2021-10-06 ENCOUNTER — Other Ambulatory Visit: Payer: Self-pay | Admitting: Internal Medicine

## 2021-11-04 ENCOUNTER — Other Ambulatory Visit: Payer: Self-pay | Admitting: Internal Medicine

## 2021-12-04 ENCOUNTER — Other Ambulatory Visit: Payer: Self-pay | Admitting: Internal Medicine

## 2022-01-01 ENCOUNTER — Encounter (HOSPITAL_COMMUNITY): Payer: Self-pay | Admitting: Emergency Medicine

## 2022-01-01 ENCOUNTER — Other Ambulatory Visit: Payer: Self-pay

## 2022-01-01 ENCOUNTER — Emergency Department (HOSPITAL_COMMUNITY)
Admission: EM | Admit: 2022-01-01 | Discharge: 2022-01-02 | Disposition: A | Discharge status: Home / Self Care | Payer: Medicare Other | Attending: Emergency Medicine | Admitting: Emergency Medicine

## 2022-01-01 DIAGNOSIS — Z7982 Long term (current) use of aspirin: Secondary | ICD-10-CM | POA: Insufficient documentation

## 2022-01-01 DIAGNOSIS — N3000 Acute cystitis without hematuria: Secondary | ICD-10-CM | POA: Diagnosis not present

## 2022-01-01 DIAGNOSIS — I10 Essential (primary) hypertension: Secondary | ICD-10-CM | POA: Insufficient documentation

## 2022-01-01 DIAGNOSIS — Z79899 Other long term (current) drug therapy: Secondary | ICD-10-CM | POA: Insufficient documentation

## 2022-01-01 DIAGNOSIS — R82998 Other abnormal findings in urine: Secondary | ICD-10-CM | POA: Diagnosis present

## 2022-01-01 LAB — URINALYSIS, ROUTINE W REFLEX MICROSCOPIC
Bilirubin Urine: NEGATIVE
Glucose, UA: NEGATIVE mg/dL
Hgb urine dipstick: NEGATIVE
Ketones, ur: NEGATIVE mg/dL
Nitrite: POSITIVE — AB
Protein, ur: 30 mg/dL — AB
Specific Gravity, Urine: 1.018 (ref 1.005–1.030)
WBC, UA: >50 WBC/hpf — ABNORMAL HIGH (ref 0–5)
pH: 6 (ref 5.0–8.0)

## 2022-01-01 LAB — COMPREHENSIVE METABOLIC PANEL
ALT: 28 U/L (ref 0–44)
AST: 28 U/L (ref 15–41)
Albumin: 3.9 g/dL (ref 3.5–5.0)
Alkaline Phosphatase: 108 U/L (ref 38–126)
Anion gap: 10 (ref 5–15)
BUN: 22 mg/dL (ref 8–23)
CO2: 25 mmol/L (ref 22–32)
Calcium: 9.6 mg/dL (ref 8.9–10.3)
Chloride: 107 mmol/L (ref 98–111)
Creatinine, Ser: 0.92 mg/dL (ref 0.44–1.00)
GFR, Estimated: >60 mL/min (ref >60)
Glucose, Bld: 120 mg/dL — ABNORMAL HIGH (ref 70–99)
Potassium: 3.9 mmol/L (ref 3.5–5.1)
Sodium: 142 mmol/L (ref 135–145)
Total Bilirubin: 0.6 mg/dL (ref 0.3–1.2)
Total Protein: 6.6 g/dL (ref 6.5–8.1)

## 2022-01-01 LAB — CBC WITH DIFFERENTIAL/PLATELET
Abs Immature Granulocytes: 0.05 10*3/uL (ref 0.00–0.07)
Basophils Absolute: 0.1 10*3/uL (ref 0.0–0.1)
Basophils Relative: 1 %
Eosinophils Absolute: 0.2 10*3/uL (ref 0.0–0.5)
Eosinophils Relative: 2 %
HCT: 44.6 % (ref 36.0–46.0)
Hemoglobin: 14.5 g/dL (ref 12.0–15.0)
Immature Granulocytes: 1 %
Lymphocytes Relative: 26 %
Lymphs Abs: 2.6 10*3/uL (ref 0.7–4.0)
MCH: 29.8 pg (ref 26.0–34.0)
MCHC: 32.5 g/dL (ref 30.0–36.0)
MCV: 91.8 fL (ref 80.0–100.0)
Monocytes Absolute: 0.9 10*3/uL (ref 0.1–1.0)
Monocytes Relative: 10 %
Neutro Abs: 5.9 10*3/uL (ref 1.7–7.7)
Neutrophils Relative %: 60 %
Platelets: 297 10*3/uL (ref 150–400)
RBC: 4.86 MIL/uL (ref 3.87–5.11)
RDW: 14.2 % (ref 11.5–15.5)
WBC: 9.8 10*3/uL (ref 4.0–10.5)
nRBC: 0 % (ref 0.0–0.2)

## 2022-01-01 NOTE — ED Provider Triage Note (Signed)
Emergency Medicine Provider Triage Evaluation Note  Kimberly Larsen , a 76 y.o. female  was evaluated in triage.  Pt complains of concerns of high blood pressure, she states that she felt more tired today than what she normally did, so she called EMS, they note that her blood pressure was in the 200s, she states that she has a slight headache and felt slightly nauseous but denies any change in vision paresthesia or weakness of her lower extremities, she takes blood pressure medication which she has been compliant with, denies any chest pain shortness of breath has no other complaints.  Review of Systems  Positive: Feeling fatigue, headache Negative: Chest pain, shortness of breath  Physical Exam  BP (!) 171/78   Pulse 75   Temp 97.8 F (36.6 C) (Oral)   Resp 16   SpO2 96%  Gen:   Awake, no distress   Resp:  Normal effort  MSK:   Moves extremities without difficulty  Other:  Cranial nerves II through XII grossly intact no difficulty with word finding following two-step commands no unilateral weakness present year.  Finger-to-nose was intact, gait fully intact  Medical Decision Making  Medically screening exam initiated at 10:24 PM.  Appropriate orders placed.  Kimberly Larsen was informed that the remainder of the evaluation will be completed by another provider, this initial triage assessment does not replace that evaluation, and the importance of remaining in the ED until their evaluation is complete.  Lab work has been ordered will need further work-up.   Marcello Fennel, PA-C 01/01/22 2226

## 2022-01-01 NOTE — ED Triage Notes (Addendum)
Patient arrived with EMS from home reports hypertension today BP= 202/100, recent changes on her medications  mild nausea and fatigue .

## 2022-01-02 MED ORDER — CEPHALEXIN 500 MG PO CAPS: 500.0000 mg | ORAL_CAPSULE | Freq: Four times a day (QID) | ORAL | 0 refills | Status: DC

## 2022-01-02 MED ORDER — CEPHALEXIN 250 MG PO CAPS
1000.0000 mg | ORAL_CAPSULE | Freq: Once | ORAL | Status: AC
  Administered 2022-01-02: 1000 mg via ORAL
  Filled 2022-01-02: qty 4

## 2022-01-02 NOTE — ED Provider Notes (Signed)
[facility] EMERGENCY DEPARTMENT Provider Note   CSN: [id number] Arrival date & time: 01/01/22  2106     History  Chief Complaint  Patient presents with   Hypertension    Kimberly Larsen is a 76 y.o. female.  76 year old female with a history of hypertension, depression and anxiety, hypothyroidism and asthma who presents the ER today with a couple different complaints.  Patient states that she started having hot flashes and feeling nauseous a few days ago.  This was shortly after starting Zoloft and she looked on the side effect profile and said something about serotonin syndrome so this worried her.  She also states that she has had some cloudy urine and history of urinary tract infections in the past.  She checked her blood pressures today and they were as high as 102 systolic which worried her so she presents here for further evaluation.  No persistent headache, nausea, vomiting, chest pain, abdominal pain, palpitations or back pain.  No other associated symptoms.   Hypertension       Home Medications Prior to Admission medications   Medication Sig Start Date End Date Taking? Authorizing Provider  cephALEXin (KEFLEX) 500 MG capsule Take 1 capsule (500 mg total) by mouth 4 (four) times daily. 01/02/22  Yes Tyrisha Benninger, Corene Cornea, MD  ALPRAZolam Duanne Moron) 0.25 MG tablet Take 0.5 mg by mouth 3 (three) times daily as needed for anxiety.  05/30/17   [provider]  amitriptyline (ELAVIL) 75 MG tablet TAKE 1 TABLET BY MOUTH AT BEDTIME 12/06/21   Vaslow, Acey Lav, MD  aspirin 81 MG EC tablet Chew 1 tablet by mouth daily.    [provider]  cabergoline (DOSTINEX) 0.5 MG tablet Take 0.5 tablets (0.25 mg total) by mouth See admin instructions. Patient takes tablets 1 mg on Tuesday and friday Patient taking differently: Take 1 mg by mouth See admin instructions. Patient takes tablets 1 mg on Tuesday and friday 10/03/20   Ventura Sellers, MD  docusate sodium (COLACE) 50  MG capsule Take 50 mg by mouth at bedtime.     [provider]  fluticasone (FLONASE) 50 MCG/ACT nasal spray Place 1 spray into both nostrils daily as needed for allergies.     [provider]  levothyroxine (SYNTHROID) 25 MCG tablet Take 25 mcg by mouth daily before breakfast.  09/02/19   [provider]  Magnesium 400 MG TABS Take 400 mg by mouth daily.    [provider]  Melatonin 10 MG TABS Take 10 mg by mouth at bedtime.    [provider]  metoprolol succinate (TOPROL-XL) 25 MG 24 hr tablet Take 25 mg by mouth daily.  06/15/14   [provider]  montelukast (SINGULAIR) 10 MG tablet Take 10 mg by mouth at bedtime.  10/06/17   [provider]  Omega-3 Fatty Acids (FISH OIL) 1000 MG CAPS Take 1 capsule by mouth in the morning and at bedtime.     [provider]  pantoprazole (PROTONIX) 40 MG tablet Take 40 mg by mouth daily. 10/03/21   [provider]  polyethylene glycol (MIRALAX / GLYCOLAX) 17 g packet Take 17 g by mouth 2 (two) times daily. Patient taking differently: Take 17 g by mouth daily as needed for moderate constipation. 11/11/18   Danae Orleans, PA-C  pravastatin (PRAVACHOL) 40 MG tablet Take 1 tablet (40 mg total) by mouth at bedtime. 09/11/12   Judeth Cornfield, NP  tiZANidine (ZANAFLEX) 2 MG tablet Take 1 tablet  by mouth 3 (three) times daily as needed for muscle spasms.     [provider]  traZODone (DESYREL) 150 MG tablet Take 150 mg by mouth at bedtime.     [provider]      Allergies    Amoxicillin-pot clavulanate and Bupropion    Review of Systems   Review of Systems  Physical Exam Updated Vital Signs BP (!) 161/74 (BP Location: Right Arm)   Pulse 72   Temp 98.6 F (37 C) (Oral)   Resp (!) 22   SpO2 97%  Physical Exam Vitals and nursing note reviewed.  HENT:     Mouth/Throat:     Mouth: Mucous membranes are dry.     Pharynx: Oropharynx is clear.  Eyes:     Pupils:  Pupils are equal, round, and reactive to light.  Pulmonary:     Effort: Pulmonary effort is normal.     Breath sounds: No wheezing.  Abdominal:     General: Abdomen is flat.  Musculoskeletal:        General: No swelling or tenderness. Normal range of motion.  Skin:    General: Skin is warm and dry.  Neurological:     General: No focal deficit present.     ED Results / Procedures / Treatments   Labs (all labs ordered are listed, but only abnormal results are displayed) Labs Reviewed  COMPREHENSIVE METABOLIC PANEL - Abnormal; Notable for the following components:      Result Value   Glucose, Bld 120 (*)    All other components within normal limits  URINALYSIS, ROUTINE W REFLEX MICROSCOPIC - Abnormal; Notable for the following components:   APPearance HAZY (*)    Protein, ur 30 (*)    Nitrite POSITIVE (*)    Leukocytes,Ua LARGE (*)    WBC, UA >50 (*)    Bacteria, UA MANY (*)    All other components within normal limits  URINE CULTURE  CBC WITH DIFFERENTIAL/PLATELET    EKG None  Radiology No results found.  Procedures Procedures    Medications Ordered in ED Medications  cephALEXin (KEFLEX) capsule 1,000 mg (1,000 mg Oral Given 01/02/22 3235)    ED Course/ Medical Decision Making/ A&P                           Medical Decision Making Amount and/or Complexity of Data Reviewed Labs: ordered.  Risk Prescription drug management.   Suspect patient likely has UTI based on her symptoms, history of UTIs and her urinalysis.  There is no evidence of endorgan damage related to the hypertension and suspect that situational related to lack of sleep, stress and possibly also related to medication changes.  No other associated symptoms.  Patient stable for discharge w/ pcp or uro follow up to ensure urine clears and PCP for recheck of BP's. Rtn here PRN for any changes or worsening of symptoms.   Final Clinical Impression(s) / ED Diagnoses Final diagnoses:  Hypertension,  unspecified type  Acute cystitis without hematuria    Rx / DC Orders ED Discharge Orders          Ordered    cephALEXin (KEFLEX) 500 MG capsule  4 times daily        01/02/22 0616              Artavius Stearns, Corene Cornea, MD 01/02/22 [phone]

## 2022-01-04 LAB — URINE CULTURE: Culture: >=100000 — AB

## 2022-01-05 ENCOUNTER — Telehealth: Payer: Self-pay | Admitting: Emergency Medicine

## 2022-01-05 NOTE — Telephone Encounter (Signed)
Post ED Visit - Positive Culture Follow-up  Culture report reviewed by antimicrobial stewardship pharmacist: [facility] Team '[]'$  Elenor Quinones, Pharm.D. '[]'$  Heide Guile, Pharm.D., BCPS AQ-ID '[]'$  Parks Neptune, Pharm.D., BCPS '[]'$  Alycia Rossetti, Pharm.D., BCPS '[]'$  [place], Pharm.D., BCPS, AAHIVP '[]'$  Legrand Como, Pharm.D., BCPS, AAHIVP '[]'$  Salome Arnt, PharmD, BCPS '[]'$  Johnnette Gourd, PharmD, BCPS '[]'$  Hughes Better, PharmD, BCPS '[]'$  Leeroy Cha, PharmD '[]'$  Laqueta Linden, PharmD, BCPS '[]'$  Albertina Parr, PharmD  [facility] Team '[]'$  Leodis Sias, PharmD '[]'$  Lindell Spar, PharmD '[]'$  Royetta Asal, PharmD '[]'$  Graylin Shiver, Rph '[]'$  Rema Fendt) Glennon Mac, PharmD '[]'$  Arlyn Dunning, PharmD '[]'$  Netta Cedars, PharmD '[]'$  Dia Sitter, PharmD '[]'$  Leone Haven, PharmD '[]'$  Gretta Arab, PharmD '[]'$  Theodis Shove, PharmD '[]'$  Peggyann Juba, PharmD '[]'$  Reuel Boom, PharmD   Positive urine culture Treated with cephalexin, organism sensitive to the same and no further patient follow-up is required at this time.  Hazle Nordmann 01/05/2022, 2:02 PM

## 2022-01-09 ENCOUNTER — Ambulatory Visit: Payer: Medicare Other | Admitting: Cardiology

## 2022-01-10 DIAGNOSIS — M65312 Trigger thumb, left thumb: Secondary | ICD-10-CM | POA: Insufficient documentation

## 2022-01-17 ENCOUNTER — Telehealth: Payer: Self-pay | Admitting: Cardiology

## 2022-01-17 NOTE — Telephone Encounter (Signed)
Spoke with pt who states that her BP has been elevated and that her PCP has adjusted her medications. Med list has been updated. 01/16/22 1325 144/71 1448 132/82 2000 162/70  01/17/22 0800 201/89 Took Losartan and Toprol XL 0905 164/81 1000 181/85 1230 163/74  Advised to keep a BP log and callback on Monday with updated list and to take BP 1-2 hours after medication and again in the evening. Pt verbalized understanding and had no additional questions.

## 2022-01-17 NOTE — Telephone Encounter (Signed)
Pt c/o BP issue: STAT if pt c/o blurred vision, one-sided weakness or slurred speech  1. What are your last 5 BP readings?    2. Are you having any other symptoms (ex. Dizziness, headache, blurred vision, passed out)? No symptoms  3. What is your BP issue? Can't get BP regulated, has gone to PCP about this.  PCP is working with her on BP medication. She stated she has also gone to [facility] emergency room about this.

## 2022-02-04 DIAGNOSIS — J329 Chronic sinusitis, unspecified: Secondary | ICD-10-CM | POA: Diagnosis not present

## 2022-02-04 DIAGNOSIS — J40 Bronchitis, not specified as acute or chronic: Secondary | ICD-10-CM | POA: Diagnosis not present

## 2022-02-04 DIAGNOSIS — N3000 Acute cystitis without hematuria: Secondary | ICD-10-CM | POA: Diagnosis not present

## 2022-02-11 DIAGNOSIS — H04123 Dry eye syndrome of bilateral lacrimal glands: Secondary | ICD-10-CM | POA: Diagnosis not present

## 2022-02-20 DIAGNOSIS — J329 Chronic sinusitis, unspecified: Secondary | ICD-10-CM | POA: Diagnosis not present

## 2022-02-20 DIAGNOSIS — I10 Essential (primary) hypertension: Secondary | ICD-10-CM | POA: Diagnosis not present

## 2022-02-20 DIAGNOSIS — G4700 Insomnia, unspecified: Secondary | ICD-10-CM | POA: Diagnosis not present

## 2022-02-20 DIAGNOSIS — R7309 Other abnormal glucose: Secondary | ICD-10-CM | POA: Diagnosis not present

## 2022-02-20 DIAGNOSIS — J40 Bronchitis, not specified as acute or chronic: Secondary | ICD-10-CM | POA: Diagnosis not present

## 2022-02-20 DIAGNOSIS — M159 Polyosteoarthritis, unspecified: Secondary | ICD-10-CM | POA: Diagnosis not present

## 2022-03-06 DIAGNOSIS — M65312 Trigger thumb, left thumb: Secondary | ICD-10-CM | POA: Diagnosis not present

## 2022-03-06 DIAGNOSIS — M79645 Pain in left finger(s): Secondary | ICD-10-CM | POA: Diagnosis not present

## 2022-03-07 DIAGNOSIS — J019 Acute sinusitis, unspecified: Secondary | ICD-10-CM | POA: Diagnosis not present

## 2022-03-07 DIAGNOSIS — R040 Epistaxis: Secondary | ICD-10-CM | POA: Diagnosis not present

## 2022-03-08 ENCOUNTER — Other Ambulatory Visit: Payer: Self-pay | Admitting: Internal Medicine

## 2022-03-08 DIAGNOSIS — D352 Benign neoplasm of pituitary gland: Secondary | ICD-10-CM

## 2022-03-08 DIAGNOSIS — H539 Unspecified visual disturbance: Secondary | ICD-10-CM

## 2022-03-14 ENCOUNTER — Encounter: Payer: Self-pay | Admitting: Cardiology

## 2022-03-14 ENCOUNTER — Ambulatory Visit: Payer: Medicare Other | Attending: Cardiology | Admitting: Cardiology

## 2022-03-14 VITALS — BP 116/76 | HR 66 | Ht 62.0 in | Wt 197.0 lb

## 2022-03-14 DIAGNOSIS — R0609 Other forms of dyspnea: Secondary | ICD-10-CM

## 2022-03-14 DIAGNOSIS — I10 Essential (primary) hypertension: Secondary | ICD-10-CM | POA: Diagnosis not present

## 2022-03-14 DIAGNOSIS — R9431 Abnormal electrocardiogram [ECG] [EKG]: Secondary | ICD-10-CM

## 2022-03-14 DIAGNOSIS — E782 Mixed hyperlipidemia: Secondary | ICD-10-CM

## 2022-03-14 NOTE — Patient Instructions (Signed)

## 2022-03-14 NOTE — Progress Notes (Signed)
Cardiology Office Note:    Date:  03/14/2022   ID:  Kimberly Larsen, DOB 02/08/1946, MRN [id number]  PCP:  Kimberly Paddy, NP  Cardiologist:  Kimberly Campus, MD    Referring MD: Kimberly Salter, NP   Chief Complaint  Patient presents with   Follow-up    History of Present Illness:    Kimberly Larsen is a 76 y.o. female with past medical history significant for endometrial cancer, fibromyalgia, gastroparesis, referred to [place] because of shortness of breath, evaluation included echocardiogram showing preserved ejection fraction, stress test was done showed no evidence of ischemia.  She is coming today 2 months for follow-up.  Cardiac wise seems to be doing well.  She ended up being in the emergency room just few months ago I did review documentation of that visit look like the reason was high blood pressure.  She complained about her neighbor who lives above her it stressed her a lot.  She actually talks a lot about the situation.  Denies have any chest pain tightness squeezing pressure burning chest shortness of breath still be better since she tried to exercise a little bit more.  Past Medical History:  Diagnosis Date   Acute postoperative respiratory failure ([place]) 03/25/2014   AKI (acute kidney injury) ([place]) 03/25/2014   Anesthesia of skin    Anxiety    Aortic atherosclerosis (HCC)    Arthritis    Back pain    Benign essential HTN 03/25/2014   Cancer ([place])    endometrial cancer   Chronic daily headache 10/23/2017   Chronic low back pain 07/30/2017   Chronic pain syndrome 02/23/2014   DDD (degenerative disc disease), lumbar    Degeneration of lumbar intervertebral disc 05/10/2019   Depression    Endometrial cancer ([place]) 01/10/2016   Expected blood loss anemia 09/02/2012   Fibromyalgia 09/08/2012   Gastroparesis    Genetic testing 12/17/2017   TumorNext [facility] + CancerNext was ordered. The CancerNext gene panel offered by [employer] includes sequencing and rearrangement analysis for the  following 32 genes:   APC, ATM, BARD1, BMPR1A, BRCA1, BRCA2, BRIP1, CDH1, CDK4, CDKN2A, CHEK2, DICER1, EPCAM, GREM1, HOXB13MLH1, MRE11A, MSH2, MSH6, MUTYH, NBN, NF1, PALB2, PMS2, POLD1, POLE, PTEN, RAD50, RAD51D, SMAD4, SMARCA4, STK11, and TP53.   Ger   History of kidney stones    HLD (hyperlipidemia)    Hx of sepsis 02/2014   DUE TO MULTIPLE KIDNEY STONES   Hyperlipemia    Hyperlipidemia 03/25/2014   Hypertension    HYPERTENSION, BENIGN SYSTEMIC 06/26/2006   Qualifier: Diagnosis of  By: Herma Ard     Insomnia 0/56/9794   Lactic acidosis 07/10/2017   Long-term current use of opiate analgesic 05/28/2017   Lumbar post-laminectomy syndrome 07/30/2017   Near syncope 07/10/2017   NEPHROLITHIASIS 06/26/2006   Qualifier: Diagnosis of  By: Herma Ard     Obese 11/28/6551   Obesity (BMI 30-39.9) 09/02/2012   Obstructive uropathy 03/25/2014   Osteoporosis    Pain in left knee 01/08/2018   Pneumonia    2014   Prolactinoma ([place]) 07/16/2017   S/P left TKA 11/10/2018   S/P right TKA 08/31/2012   Severe sepsis with septic shock ([place]) 03/25/2014   UTI (urinary tract infection)     Past Surgical History:  Procedure Laterality Date   BACK SURGERY  2013   CARPAL TUNNEL RELEASE     R hand   CYSTOSCOPY WITH URETEROSCOPY AND STENT PLACEMENT Bilateral 03/25/2014   Procedure: CYSTOSCOPY WITH URETEROSCOPY AND  STENT PLACEMENT;  Surgeon: Raynelle Bring, MD;  Location: [facility];  Service: Urology;  Laterality: Bilateral;   CYSTOSCOPY WITH URETEROSCOPY AND STENT PLACEMENT Bilateral 04/18/2014   Procedure: CYSTOSCOPY WITH URETEROSCOPY AND STENT PLACEMENT,RETROGRADE;  Surgeon: Raynelle Bring, MD;  Location: [facility];  Service: Urology;  Laterality: Bilateral;   CYSTOSCOPY WITH URETEROSCOPY AND STENT PLACEMENT Right 05/30/2014   Procedure: CYSTOSCOPY WITH URETEROSCOPY AND STENT PLACEMENT;  Surgeon: Raynelle Bring, MD;  Location: [facility];  Service: Urology;  Laterality: Right;   CYSTOSCOPY/RETROGRADE/URETEROSCOPY  Left 05/30/2014   Procedure: LEFT RETROGRADE;  Surgeon: Raynelle Bring, MD;  Location[facility];  Service: Urology;  Laterality: Left;   CYSTOSCOPY/URETEROSCOPY/HOLMIUM LASER/STENT PLACEMENT Left 11/25/2019   Procedure: CYSTOSCOPY/RETROGRADE/URETEROSCOPY/HOLMIUM LASER/STENT PLACEMENT;  Surgeon: Raynelle Bring, MD;  Location[facility];  Service: Urology;  Laterality: Left;   EYE SURGERY     cataract surgery bil   GANGLION CYST EXCISION     L ankle   HAMMERTOE RECONSTRUCTION WITH WEIL OSTEOTOMY Left 09/05/2016   Procedure: Second Metatarsal Weil Osteotomy and Hammertoe Correction;  Surgeon: Wylene Simmer, MD;  Location: [facility];  Service: Orthopedics;  Laterality: Left;   HOLMIUM LASER APPLICATION Bilateral 16/01/9603   Procedure: HOLMIUM LASER APPLICATION;  Surgeon: Raynelle Bring, MD;  Location[facility];  Service: Urology;  Laterality: Bilateral;   JOINT REPLACEMENT     total knee   LITHOTRIPSY     x 3   LUMBAR FUSION  09/2011   METATARSAL OSTEOTOMY WITH BUNIONECTOMY Left 09/05/2016   Procedure: Left First Metatarsal Scarf Osteotomy, Modified McBride Bunion Correction;  Surgeon: Wylene Simmer, MD;  Location: [facility];  Service: Orthopedics;  Laterality: Left;   RHINOPLASTY     ROBOTIC ASSISTED TOTAL HYSTERECTOMY WITH BILATERAL SALPINGO OOPHERECTOMY Bilateral 01/16/2016   Procedure: XI ROBOTIC ASSISTED TOTAL HYSTERECTOMY WITH BILATERAL SALPINGO OOPHORECTOMY AND BILATERAL PELVIC LYMPH NODE DISSECTION;  Surgeon: Everitt Amber, MD;  Location[facility];  Service: Gynecology;  Laterality: Bilateral;   SPINAL CORD STIMULATOR INSERTION N/A 02/23/2014   Procedure: SPINAL CORD STIMULATOR PLACEMENT ;  Surgeon: Melina Schools, MD;  Location: [facility];  Service: Orthopedics;  Laterality: N/A;   TONSILLECTOMY     TOTAL KNEE ARTHROPLASTY Right 08/31/2012   Procedure: RIGHT TOTAL KNEE ARTHROPLASTY;  Surgeon: Mauri Pole, MD;  [facility];  Service: Orthopedics;  Laterality: Right;  with LMA    TOTAL KNEE ARTHROPLASTY Left 11/10/2018   Procedure: TOTAL KNEE ARTHROPLASTY;  Surgeon: Paralee Cancel, MD;  [facility];  Service: Orthopedics;  Laterality: Left;  70 mins   TUBAL LIGATION      Current Medications: Current Meds  Medication Sig   aspirin 81 MG EC tablet Chew 1 tablet by mouth daily.   cabergoline (DOSTINEX) 0.5 MG tablet Take 0.5 tablets (0.25 mg total) by mouth See admin instructions. Patient takes tablets 1 mg on Tuesday and friday (Patient taking differently: Take 1 mg by mouth See admin instructions. Patient takes tablets 1 mg on Tuesday and friday)   cloNIDine (CATAPRES) 0.1 MG tablet Take 0.1 mg by mouth at bedtime.   docusate sodium (COLACE) 50 MG capsule Take 50 mg by mouth at bedtime.    fluticasone (FLONASE) 50 MCG/ACT nasal spray Place 1 spray into both nostrils daily as needed for allergies.    levothyroxine (SYNTHROID) 25 MCG tablet Take 25 mcg by mouth daily before breakfast.    losartan (COZAAR) 50 MG tablet Take 50 mg by mouth daily.   Magnesium 400 MG TABS Take  400 mg by mouth daily.   Melatonin 10 MG TABS Take 10 mg by mouth at bedtime.   metoprolol succinate (TOPROL-XL) 25 MG 24 hr tablet Take 25 mg by mouth daily.    montelukast (SINGULAIR) 10 MG tablet Take 10 mg by mouth at bedtime.    Omega-3 Fatty Acids (FISH OIL) 1000 MG CAPS Take 1 capsule by mouth in the morning and at bedtime.    pantoprazole (PROTONIX) 40 MG tablet Take 40 mg by mouth daily.   polyethylene glycol (MIRALAX / GLYCOLAX) 17 g packet Take 17 g by mouth 2 (two) times daily. (Patient taking differently: Take 17 g by mouth daily as needed for moderate constipation.)   pravastatin (PRAVACHOL) 40 MG tablet Take 1 tablet (40 mg total) by mouth at bedtime.   sertraline (ZOLOFT) 25 MG tablet Take 25 mg by mouth daily.   tiZANidine (ZANAFLEX) 2 MG tablet Take 1 tablet by mouth 3 (three) times daily as needed for muscle spasms.    traZODone (DESYREL) 150 MG tablet Take 150 mg by mouth at  bedtime.    [DISCONTINUED] ALPRAZolam (XANAX) 0.25 MG tablet Take 0.5 mg by mouth 3 (three) times daily as needed for anxiety.    [DISCONTINUED] amitriptyline (ELAVIL) 75 MG tablet TAKE 1 TABLET BY MOUTH AT BEDTIME (Patient taking differently: Take 75 mg by mouth at bedtime.)   [DISCONTINUED] cephALEXin (KEFLEX) 500 MG capsule Take 1 capsule (500 mg total) by mouth 4 (four) times daily.     Allergies:   Amoxicillin-pot clavulanate and Bupropion   Social History   Socioeconomic History   Marital status: Divorced    Spouse name: Not on file   Number of children: 1   Years of education: 12   Highest education level: Not on file  Occupational History   Not on file  Tobacco Use   Smoking status: Never   Smokeless tobacco: Never  Vaping Use   Vaping Use: Never used  Substance and Sexual Activity   Alcohol use: No   Drug use: No   Sexual activity: Yes  Other Topics Concern   Not on file  Social History Narrative   Lives alone   Social Determinants of Health   Financial Resource Strain: Not on file  Food Insecurity: Not on file  Transportation Needs: Not on file  Physical Activity: Not on file  Stress: Not on file  Social Connections: Not on file     Family History: The patient's family history includes COPD in her mother; Emphysema in her father and mother; Heart disease in her brother and father; Hypertension in her father and mother; Stroke in her father and mother. ROS:   Please see the history of present illness.    All 14 point review of systems negative except as described per history of present illness  EKGs/Labs/Other Studies Reviewed:      Recent Labs: 01/01/2022: ALT 28; BUN 22; Creatinine, Ser 0.92; Hemoglobin 14.5; Platelets 297; Potassium 3.9; Sodium 142  Recent Lipid Panel No results found for: "CHOL", "TRIG", "HDL", "CHOLHDL", "VLDL", "LDLCALC", "LDLDIRECT"  Physical Exam:    VS:  BP 116/76 (BP Location: Left Arm, Patient Position: Sitting)   Pulse 66    Ht _0  (1.575 m)   Wt 197 lb (89.4 kg)   SpO2 99%   BMI 36.03 kg/m     Wt Readings from Last 3 Encounters:  03/14/22 197 lb (89.4 kg)  10/04/21 200 lb 6.4 oz (90.9 kg)  11/08/20 196 lb 8 oz (  89.1 kg)     GEN:  Well nourished, well developed in no acute distress HEENT: Normal NECK: No JVD; No carotid bruits LYMPHATICS: No lymphadenopathy CARDIAC: RRR, no murmurs, no rubs, no gallops RESPIRATORY:  Clear to auscultation without rales, wheezing or rhonchi  ABDOMEN: Soft, non-tender, non-distended MUSCULOSKELETAL:  No edema; No deformity  SKIN: Warm and dry LOWER EXTREMITIES: no swelling NEUROLOGIC:  Alert and oriented x 3 PSYCHIATRIC:  Normal affect   ASSESSMENT:    1. Nonspecific abnormal electrocardiogram (ECG) (EKG)   2. HYPERTENSION, BENIGN SYSTEMIC   3. Mixed hyperlipidemia   4. Dyspnea on exertion    PLAN:    In order of problems listed above:  Dyspnea on exertion: So far no cardiac etiology of her symptomatology.  We will continue monitoring. Essential hypertension blood pressure seems to well controlled today I will not alter any of her medications.   Dyslipidemia I did review her K PN show me total cholesterol 170 HDL 47.  We will get a copy of her full cholesterol profile from primary care physician in the meantime she is on pravastatin 40 which I will maintain her on   Medication Adjustments/Labs and Tests Ordered: Current medicines are reviewed at length with the patient today.  Concerns regarding medicines are outlined above.  Orders Placed This Encounter  Procedures   EKG 12-Lead   Medication changes: No orders of the defined types were placed in this encounter.   Signed, Park Liter, MD, [facility] 03/14/2022 12:08 PM    [place]

## 2022-03-29 DIAGNOSIS — H53461 Homonymous bilateral field defects, right side: Secondary | ICD-10-CM | POA: Diagnosis not present

## 2022-04-03 DIAGNOSIS — J40 Bronchitis, not specified as acute or chronic: Secondary | ICD-10-CM | POA: Diagnosis not present

## 2022-04-03 DIAGNOSIS — J329 Chronic sinusitis, unspecified: Secondary | ICD-10-CM | POA: Diagnosis not present

## 2022-04-03 DIAGNOSIS — N3001 Acute cystitis with hematuria: Secondary | ICD-10-CM | POA: Diagnosis not present

## 2022-04-09 DIAGNOSIS — G4700 Insomnia, unspecified: Secondary | ICD-10-CM | POA: Diagnosis not present

## 2022-04-09 DIAGNOSIS — M159 Polyosteoarthritis, unspecified: Secondary | ICD-10-CM | POA: Diagnosis not present

## 2022-04-09 DIAGNOSIS — I10 Essential (primary) hypertension: Secondary | ICD-10-CM | POA: Diagnosis not present

## 2022-04-15 DIAGNOSIS — R051 Acute cough: Secondary | ICD-10-CM | POA: Diagnosis not present

## 2022-04-15 DIAGNOSIS — R0981 Nasal congestion: Secondary | ICD-10-CM | POA: Diagnosis not present

## 2022-04-15 DIAGNOSIS — J324 Chronic pansinusitis: Secondary | ICD-10-CM | POA: Diagnosis not present

## 2022-04-15 DIAGNOSIS — J029 Acute pharyngitis, unspecified: Secondary | ICD-10-CM | POA: Diagnosis not present

## 2022-04-18 ENCOUNTER — Other Ambulatory Visit: Payer: Medicare Other

## 2022-04-30 DIAGNOSIS — R300 Dysuria: Secondary | ICD-10-CM | POA: Diagnosis not present

## 2022-04-30 DIAGNOSIS — R8281 Pyuria: Secondary | ICD-10-CM | POA: Diagnosis not present

## 2022-04-30 DIAGNOSIS — N3091 Cystitis, unspecified with hematuria: Secondary | ICD-10-CM | POA: Diagnosis not present

## 2022-05-08 DIAGNOSIS — N200 Calculus of kidney: Secondary | ICD-10-CM | POA: Diagnosis not present

## 2022-05-08 DIAGNOSIS — R1084 Generalized abdominal pain: Secondary | ICD-10-CM | POA: Diagnosis not present

## 2022-05-08 DIAGNOSIS — R8271 Bacteriuria: Secondary | ICD-10-CM | POA: Diagnosis not present

## 2022-05-08 DIAGNOSIS — R3982 Chronic bladder pain: Secondary | ICD-10-CM | POA: Diagnosis not present

## 2022-05-13 DIAGNOSIS — K219 Gastro-esophageal reflux disease without esophagitis: Secondary | ICD-10-CM | POA: Diagnosis not present

## 2022-05-13 DIAGNOSIS — E039 Hypothyroidism, unspecified: Secondary | ICD-10-CM | POA: Diagnosis not present

## 2022-05-13 DIAGNOSIS — M2550 Pain in unspecified joint: Secondary | ICD-10-CM | POA: Diagnosis not present

## 2022-05-13 DIAGNOSIS — M797 Fibromyalgia: Secondary | ICD-10-CM | POA: Diagnosis not present

## 2022-05-13 DIAGNOSIS — M159 Polyosteoarthritis, unspecified: Secondary | ICD-10-CM | POA: Diagnosis not present

## 2022-05-13 DIAGNOSIS — E669 Obesity, unspecified: Secondary | ICD-10-CM | POA: Diagnosis not present

## 2022-05-13 DIAGNOSIS — R7309 Other abnormal glucose: Secondary | ICD-10-CM | POA: Diagnosis not present

## 2022-05-13 DIAGNOSIS — R69 Illness, unspecified: Secondary | ICD-10-CM | POA: Diagnosis not present

## 2022-05-13 DIAGNOSIS — G4700 Insomnia, unspecified: Secondary | ICD-10-CM | POA: Diagnosis not present

## 2022-05-13 DIAGNOSIS — Z79899 Other long term (current) drug therapy: Secondary | ICD-10-CM | POA: Diagnosis not present

## 2022-05-13 DIAGNOSIS — I10 Essential (primary) hypertension: Secondary | ICD-10-CM | POA: Diagnosis not present

## 2022-05-13 DIAGNOSIS — E7800 Pure hypercholesterolemia, unspecified: Secondary | ICD-10-CM | POA: Diagnosis not present

## 2022-05-21 DIAGNOSIS — E221 Hyperprolactinemia: Secondary | ICD-10-CM | POA: Diagnosis not present

## 2022-05-21 DIAGNOSIS — D352 Benign neoplasm of pituitary gland: Secondary | ICD-10-CM | POA: Diagnosis not present

## 2022-05-21 DIAGNOSIS — E038 Other specified hypothyroidism: Secondary | ICD-10-CM | POA: Diagnosis not present

## 2022-05-21 DIAGNOSIS — R5383 Other fatigue: Secondary | ICD-10-CM | POA: Diagnosis not present

## 2022-06-17 DIAGNOSIS — H25813 Combined forms of age-related cataract, bilateral: Secondary | ICD-10-CM | POA: Diagnosis not present

## 2022-06-17 DIAGNOSIS — Z0100 Encounter for examination of eyes and vision without abnormal findings: Secondary | ICD-10-CM | POA: Diagnosis not present

## 2022-06-17 DIAGNOSIS — H04123 Dry eye syndrome of bilateral lacrimal glands: Secondary | ICD-10-CM | POA: Diagnosis not present

## 2022-06-17 DIAGNOSIS — H53461 Homonymous bilateral field defects, right side: Secondary | ICD-10-CM | POA: Diagnosis not present

## 2022-06-24 DIAGNOSIS — Z6835 Body mass index (BMI) 35.0-35.9, adult: Secondary | ICD-10-CM | POA: Diagnosis not present

## 2022-06-24 DIAGNOSIS — M797 Fibromyalgia: Secondary | ICD-10-CM | POA: Diagnosis not present

## 2022-06-24 DIAGNOSIS — R69 Illness, unspecified: Secondary | ICD-10-CM | POA: Diagnosis not present

## 2022-06-24 DIAGNOSIS — G4700 Insomnia, unspecified: Secondary | ICD-10-CM | POA: Diagnosis not present

## 2022-06-24 DIAGNOSIS — R300 Dysuria: Secondary | ICD-10-CM | POA: Diagnosis not present

## 2022-06-24 DIAGNOSIS — M159 Polyosteoarthritis, unspecified: Secondary | ICD-10-CM | POA: Diagnosis not present

## 2022-06-26 DIAGNOSIS — R300 Dysuria: Secondary | ICD-10-CM | POA: Diagnosis not present

## 2022-06-26 DIAGNOSIS — D352 Benign neoplasm of pituitary gland: Secondary | ICD-10-CM | POA: Diagnosis not present

## 2022-07-17 DIAGNOSIS — M65312 Trigger thumb, left thumb: Secondary | ICD-10-CM | POA: Diagnosis not present

## 2022-07-18 DIAGNOSIS — L814 Other melanin hyperpigmentation: Secondary | ICD-10-CM | POA: Diagnosis not present

## 2022-07-18 DIAGNOSIS — D485 Neoplasm of uncertain behavior of skin: Secondary | ICD-10-CM | POA: Diagnosis not present

## 2022-07-18 DIAGNOSIS — L821 Other seborrheic keratosis: Secondary | ICD-10-CM | POA: Diagnosis not present

## 2022-07-18 DIAGNOSIS — D2239 Melanocytic nevi of other parts of face: Secondary | ICD-10-CM | POA: Diagnosis not present

## 2022-07-18 DIAGNOSIS — D225 Melanocytic nevi of trunk: Secondary | ICD-10-CM | POA: Diagnosis not present

## 2022-07-24 DIAGNOSIS — Z6835 Body mass index (BMI) 35.0-35.9, adult: Secondary | ICD-10-CM | POA: Diagnosis not present

## 2022-07-24 DIAGNOSIS — N3000 Acute cystitis without hematuria: Secondary | ICD-10-CM | POA: Diagnosis not present

## 2022-07-24 DIAGNOSIS — G4700 Insomnia, unspecified: Secondary | ICD-10-CM | POA: Diagnosis not present

## 2022-07-24 DIAGNOSIS — M797 Fibromyalgia: Secondary | ICD-10-CM | POA: Diagnosis not present

## 2022-07-24 DIAGNOSIS — J3089 Other allergic rhinitis: Secondary | ICD-10-CM | POA: Diagnosis not present

## 2022-08-14 DIAGNOSIS — N3000 Acute cystitis without hematuria: Secondary | ICD-10-CM | POA: Diagnosis not present

## 2022-08-14 DIAGNOSIS — Z6834 Body mass index (BMI) 34.0-34.9, adult: Secondary | ICD-10-CM | POA: Diagnosis not present

## 2022-08-14 DIAGNOSIS — G4700 Insomnia, unspecified: Secondary | ICD-10-CM | POA: Diagnosis not present

## 2022-08-14 DIAGNOSIS — R69 Illness, unspecified: Secondary | ICD-10-CM | POA: Diagnosis not present

## 2022-08-14 DIAGNOSIS — Z1331 Encounter for screening for depression: Secondary | ICD-10-CM | POA: Diagnosis not present

## 2022-08-14 DIAGNOSIS — Z1339 Encounter for screening examination for other mental health and behavioral disorders: Secondary | ICD-10-CM | POA: Diagnosis not present

## 2022-08-23 DIAGNOSIS — N3000 Acute cystitis without hematuria: Secondary | ICD-10-CM | POA: Diagnosis not present

## 2022-08-23 DIAGNOSIS — Z6834 Body mass index (BMI) 34.0-34.9, adult: Secondary | ICD-10-CM | POA: Diagnosis not present

## 2022-08-23 DIAGNOSIS — N390 Urinary tract infection, site not specified: Secondary | ICD-10-CM | POA: Diagnosis not present

## 2022-08-23 DIAGNOSIS — B9620 Unspecified Escherichia coli [E. coli] as the cause of diseases classified elsewhere: Secondary | ICD-10-CM | POA: Diagnosis not present

## 2022-08-25 DIAGNOSIS — I10 Essential (primary) hypertension: Secondary | ICD-10-CM | POA: Diagnosis not present

## 2022-08-25 DIAGNOSIS — M792 Neuralgia and neuritis, unspecified: Secondary | ICD-10-CM | POA: Diagnosis not present

## 2022-08-25 DIAGNOSIS — F419 Anxiety disorder, unspecified: Secondary | ICD-10-CM | POA: Diagnosis not present

## 2022-08-25 DIAGNOSIS — R32 Unspecified urinary incontinence: Secondary | ICD-10-CM | POA: Diagnosis not present

## 2022-08-25 DIAGNOSIS — E039 Hypothyroidism, unspecified: Secondary | ICD-10-CM | POA: Diagnosis not present

## 2022-08-25 DIAGNOSIS — E785 Hyperlipidemia, unspecified: Secondary | ICD-10-CM | POA: Diagnosis not present

## 2022-08-25 DIAGNOSIS — N390 Urinary tract infection, site not specified: Secondary | ICD-10-CM | POA: Diagnosis not present

## 2022-08-25 DIAGNOSIS — M791 Myalgia, unspecified site: Secondary | ICD-10-CM | POA: Diagnosis not present

## 2022-08-25 DIAGNOSIS — Z008 Encounter for other general examination: Secondary | ICD-10-CM | POA: Diagnosis not present

## 2022-08-25 DIAGNOSIS — E669 Obesity, unspecified: Secondary | ICD-10-CM | POA: Diagnosis not present

## 2022-08-25 DIAGNOSIS — J301 Allergic rhinitis due to pollen: Secondary | ICD-10-CM | POA: Diagnosis not present

## 2022-08-25 DIAGNOSIS — K219 Gastro-esophageal reflux disease without esophagitis: Secondary | ICD-10-CM | POA: Diagnosis not present

## 2022-08-25 DIAGNOSIS — M1990 Unspecified osteoarthritis, unspecified site: Secondary | ICD-10-CM | POA: Diagnosis not present

## 2022-09-04 ENCOUNTER — Encounter: Payer: Self-pay | Admitting: Internal Medicine

## 2022-09-04 ENCOUNTER — Ambulatory Visit: Payer: Medicare HMO | Admitting: Internal Medicine

## 2022-09-04 ENCOUNTER — Other Ambulatory Visit: Payer: Self-pay

## 2022-09-04 ENCOUNTER — Telehealth: Payer: Self-pay

## 2022-09-04 VITALS — BP 128/78 | HR 63 | Temp 97.7°F | Resp 16 | Ht 62.0 in | Wt 186.0 lb

## 2022-09-04 DIAGNOSIS — N390 Urinary tract infection, site not specified: Secondary | ICD-10-CM | POA: Diagnosis not present

## 2022-09-04 MED ORDER — FOSFOMYCIN TROMETHAMINE 3 G PO PACK: 3.0000 g | PACK | Freq: Once | ORAL | 0 refills | Status: AC

## 2022-09-04 MED ORDER — ESTROGENS CONJUGATED 0.625 MG/GM VA CREA: 1.0000 | TOPICAL_CREAM | Freq: Every day | VAGINAL | 12 refills | Status: DC

## 2022-09-04 NOTE — Progress Notes (Signed)
[name], female   DOB: 1945/09/14, 77 y.o.   MRN: [id number]  HPI Kimberly Larsen is a 77 y.o. female with past medical history significant for endometrial cancer, fibromyalgia, gastroparesis, recurrent uti ( initially sensitive e.coli but now increasing resistant with ESBL ecoli also R to nitrofurantoin, R FAQ, and R TMP/SMX)  that has tracked back since August 2023  Referred for uti from [facility].   Has also seen dr Kimberly Larsen. In 2015, had urosepsis for kidney stones. Sees dr Kimberly Larsen annually. Sees him next may 31st.  Previous to 2015, did have lithotripsy.   No burning urination. But has low pelvic pain. Urgency +, in the last month more flank pain on the right side    Had rmsf 2021  Outpatient Encounter Medications as of 09/04/2022  Medication Sig   acetaminophen (TYLENOL) 325 MG tablet Take 650 mg by mouth every 6 (six) hours as needed.   aspirin 81 MG EC tablet Chew 1 tablet by mouth daily.   AZO D-MANNOSE PO Take by mouth.   cabergoline (DOSTINEX) 0.5 MG tablet Take 0.5 tablets (0.25 mg total) by mouth See admin instructions. Patient takes tablets 1 mg on Tuesday and friday (Patient taking differently: Take 1 mg by mouth See admin instructions. Patient takes tablets 1 mg on Tuesday and friday)   calcium citrate (CALCITRATE - DOSED IN MG ELEMENTAL CALCIUM) 950 (200 Ca) MG tablet Take 630 mg of elemental calcium by mouth daily.   clonazePAM (KLONOPIN) 0.25 MG disintegrating tablet Take 0.25 mg by mouth 2 (two) times daily.   cloNIDine (CATAPRES) 0.1 MG tablet Take 0.1 mg by mouth at bedtime.   docusate sodium (COLACE) 50 MG capsule Take 50 mg by mouth at bedtime.    fluticasone (FLONASE) 50 MCG/ACT nasal spray Place 1 spray into both nostrils daily as needed for allergies.    levothyroxine (SYNTHROID) 25 MCG tablet Take 25 mcg by mouth daily before breakfast.    losartan (COZAAR) 50 MG tablet Take 50 mg by mouth daily.   Magnesium 400 MG TABS  Take 400 mg by mouth daily.   Melatonin 10 MG TABS Take 10 mg by mouth at bedtime.   methocarbamol (ROBAXIN) 500 MG tablet Take 500 mg by mouth 4 (four) times daily.   metoprolol succinate (TOPROL-XL) 25 MG 24 hr tablet Take 25 mg by mouth daily.    montelukast (SINGULAIR) 10 MG tablet Take 10 mg by mouth at bedtime.    Multiple Vitamins-Minerals (HAIR SKIN & NAILS ADVANCED PO) Take by mouth.   Omega-3 Fatty Acids (FISH OIL) 1000 MG CAPS Take 1 capsule by mouth in the morning and at bedtime.    ondansetron (ZOFRAN) 4 MG tablet Take 4 mg by mouth every 8 (eight) hours as needed for nausea or vomiting.   pantoprazole (PROTONIX) 40 MG tablet Take 40 mg by mouth daily.   polyethylene glycol (MIRALAX / GLYCOLAX) 17 g packet Take 17 g by mouth 2 (two) times daily. (Patient taking differently: Take 17 g by mouth daily as needed for moderate constipation.)   pravastatin (PRAVACHOL) 40 MG tablet Take 1 tablet (40 mg total) by mouth at bedtime.   pregabalin (LYRICA) 50 MG capsule Take 50 mg by mouth 3 (three) times daily.   sertraline (ZOLOFT) 25 MG tablet Take 25 mg by mouth daily. (Patient not taking: Reported on 09/04/2022)   tiZANidine (ZANAFLEX) 2 MG tablet Take 1 tablet by mouth 3 (three) times daily as  needed for muscle spasms.  (Patient not taking: Reported on 09/04/2022)   traZODone (DESYREL) 150 MG tablet Take 150 mg by mouth at bedtime.  (Patient not taking: Reported on 09/04/2022)   No facility-administered encounter medications on file as of 09/04/2022.     Patient Active Problem List   Diagnosis Date Noted   Trigger thumb of left hand 01/10/2022   Complex regional pain syndrome of lower limb 02/13/2021   Sacroiliac joint pain 01/07/2021   Nonspecific abnormal electrocardiogram (ECG) (EKG) 11/03/2020   Dyspnea on exertion 08/19/2019   Degeneration of lumbar intervertebral disc 05/10/2019   Obese 11/11/2018   S/P left TKA 11/10/2018   Pain in left knee 01/08/2018   Genetic testing 12/17/2017    Chronic low back pain 07/30/2017   Lumbar post-laminectomy syndrome 07/30/2017   Prolactinoma (HCC) 07/16/2017   UTI (urinary tract infection) 07/10/2017   Near syncope 07/10/2017   Lactic acidosis 07/10/2017   Long-term current use of opiate analgesic 05/28/2017   Endometrial cancer (HCC) 01/10/2016   Severe sepsis with septic shock (HCC) 03/25/2014   AKI (acute kidney injury) (HCC) 03/25/2014   Benign essential HTN 03/25/2014   Obstructive uropathy 03/25/2014   Hyperlipidemia 03/25/2014   Acute postoperative respiratory failure (HCC) 03/25/2014   Chronic pain syndrome 02/23/2014   Fibromyalgia 09/08/2012   Insomnia 09/08/2012   Obesity (BMI 30-39.9) 09/02/2012   Expected blood loss anemia 09/02/2012   S/P right TKA 08/31/2012   HYPERTENSION, BENIGN SYSTEMIC 06/26/2006   NEPHROLITHIASIS 06/26/2006     Health Maintenance Due  Topic Date Due   COVID-19 Vaccine (1) Never done   Hepatitis C Screening  Never done   COLONOSCOPY (Pts 45-81yrs Insurance coverage will need to be confirmed)  Never done   MAMMOGRAM  10/16/2012   Pneumonia Vaccine 67+ Years old (1 of 1 - PCV) Never done   DEXA SCAN  Never done   Medicare Annual Wellness (AWV)  12/20/2021     Review of Systems +low pelvic pain  Physical Exam   BP 128/78   Pulse 63   Temp 97.7 F (36.5 C) (Temporal)   Resp 16   Ht 5\' 2"  (1.575 m)   Wt 186 lb (84.4 kg)   SpO2 98%   BMI 34.02 kg/m    Physical Exam  Constitutional:  oriented to person, place, and time. appears well-developed and well-nourished. No distress.  HENT: [place]/AT, PERRLA, no scleral icterus Mouth/Throat: Oropharynx is clear and moist. No oropharyngeal exudate.  Cardiovascular: Normal rate, regular rhythm and normal heart sounds. Exam reveals no gallop and no friction rub.  No murmur heard.  Pulmonary/Chest: Effort normal and breath sounds normal. No respiratory distress.  has no wheezes.  Neck = supple, no nuchal rigidity Abdominal: Soft. Bowel  sounds are normal.  exhibits no distension. There is no tenderness.  Lymphadenopathy: no cervical adenopathy. No axillary adenopathy Neurological: alert and oriented to person, place, and time.  Skin: Skin is warm and dry. No rash noted. No erythema.  Psychiatric: a normal mood and affect.  behavior is normal.    CBC Lab Results  Component Value Date   WBC 9.8 01/01/2022   RBC 4.86 01/01/2022   HGB 14.5 01/01/2022   HCT 44.6 01/01/2022   PLT 297 01/01/2022   MCV 91.8 01/01/2022   MCH 29.8 01/01/2022   MCHC 32.5 01/01/2022   RDW 14.2 01/01/2022   LYMPHSABS 2.6 01/01/2022   MONOABS 0.9 01/01/2022   EOSABS 0.2 01/01/2022    BMET Lab Results  Component Value Date   NA 142 01/01/2022   K 3.9 01/01/2022   CL 107 01/01/2022   CO2 25 01/01/2022   GLUCOSE 120 (H) 01/01/2022   BUN 22 01/01/2022   CREATININE 0.92 01/01/2022   CALCIUM 9.6 01/01/2022   GFRNONAA >60 01/01/2022   GFRAA 59 (L) 12/03/2019     Assessment and Plan   Will do monural of 3 doses due to complicated uti Will reach out to Kimberly Larsen about work up (any bladder reflux. And need for lithotripsy for stones as nidus Stop using OTC uti test strips

## 2022-09-04 NOTE — Telephone Encounter (Signed)
PA approved through 04/29/23.  Approval faxed to pharmacy.   Sandie Ano, RN

## 2022-09-04 NOTE — Telephone Encounter (Signed)
PA for Fosfomycin completed via covermymeds, awaiting response.   Key: [id number]  Sandie Ano, RN

## 2022-09-07 ENCOUNTER — Encounter: Payer: Self-pay | Admitting: Internal Medicine

## 2022-09-09 NOTE — Telephone Encounter (Signed)
I am not sure why Dr. Drue Second prescribed the premarin. We saw her for a ESBL UTI one time. I would defer this to either Dr. Drue Second or have her reach out to her PCP to get information about this. Not sure I feel comfortable providing an answer.

## 2022-09-12 ENCOUNTER — Encounter: Payer: Self-pay | Admitting: Internal Medicine

## 2022-09-24 ENCOUNTER — Ambulatory Visit: Payer: Medicare HMO | Admitting: Internal Medicine

## 2022-09-24 ENCOUNTER — Encounter: Payer: Self-pay | Admitting: Internal Medicine

## 2022-09-24 ENCOUNTER — Other Ambulatory Visit: Payer: Self-pay

## 2022-09-24 VITALS — BP 116/71 | HR 60 | Temp 97.9°F | Wt 188.0 lb

## 2022-09-24 DIAGNOSIS — N390 Urinary tract infection, site not specified: Secondary | ICD-10-CM | POA: Diagnosis not present

## 2022-09-24 NOTE — Progress Notes (Unsigned)
[name], female   DOB: 20-Jul-1945, 77 y.o.   MRN: [id number]  HPI Finished 3 doses of monurol but no difference in her symptoms  Still taking azo pills Finished out premarin but insurance not covering more.    Outpatient Encounter Medications as of 09/24/2022  Medication Sig   acetaminophen (TYLENOL) 325 MG tablet Take 650 mg by mouth every 6 (six) hours as needed.   aspirin 81 MG EC tablet Chew 1 tablet by mouth daily.   AZO D-MANNOSE PO Take by mouth.   cabergoline (DOSTINEX) 0.5 MG tablet Take 0.5 tablets (0.25 mg total) by mouth See admin instructions. Patient takes tablets 1 mg on Tuesday and friday (Patient taking differently: Take 1 mg by mouth See admin instructions. Patient takes tablets 1 mg on Tuesday and friday)   calcium citrate (CALCITRATE - DOSED IN MG ELEMENTAL CALCIUM) 950 (200 Ca) MG tablet Take 630 mg of elemental calcium by mouth daily.   clonazePAM (KLONOPIN) 0.25 MG disintegrating tablet Take 0.25 mg by mouth 2 (two) times daily.   cloNIDine (CATAPRES) 0.1 MG tablet Take 0.1 mg by mouth at bedtime.   conjugated estrogens (PREMARIN) vaginal cream Place 1 Applicatorful vaginally daily.   conjugated estrogens (PREMARIN) vaginal cream Place 1 applicator vaginally daily.   fluticasone (FLONASE) 50 MCG/ACT nasal spray Place 1 spray into both nostrils daily as needed for allergies.    fosfomycin (MONUROL) 3 g PACK Take by mouth.   levothyroxine (SYNTHROID) 25 MCG tablet Take 25 mcg by mouth daily before breakfast.    losartan (COZAAR) 50 MG tablet Take 50 mg by mouth daily.   Magnesium 400 MG TABS Take 400 mg by mouth daily.   Melatonin 10 MG TABS Take 10 mg by mouth at bedtime.   methocarbamol (ROBAXIN) 500 MG tablet Take 500 mg by mouth 4 (four) times daily.   metoprolol succinate (TOPROL-XL) 25 MG 24 hr tablet Take 25 mg by mouth daily.    montelukast (SINGULAIR) 10 MG tablet Take 10 mg by mouth at bedtime.    Multiple Vitamins-Minerals (HAIR  SKIN & NAILS ADVANCED PO) Take by mouth.   Omega-3 Fatty Acids (FISH OIL) 1000 MG CAPS Take 1 capsule by mouth in the morning and at bedtime.    ondansetron (ZOFRAN) 4 MG tablet Take 4 mg by mouth every 8 (eight) hours as needed for nausea or vomiting.   pantoprazole (PROTONIX) 40 MG tablet Take 40 mg by mouth daily.   pravastatin (PRAVACHOL) 40 MG tablet Take 1 tablet (40 mg total) by mouth at bedtime.   sertraline (ZOLOFT) 25 MG tablet Take 25 mg by mouth daily.   docusate sodium (COLACE) 50 MG capsule Take 50 mg by mouth at bedtime.  (Patient not taking: Reported on 09/24/2022)   polyethylene glycol (MIRALAX / GLYCOLAX) 17 g packet Take 17 g by mouth 2 (two) times daily. (Patient not taking: Reported on 09/24/2022)   pregabalin (LYRICA) 50 MG capsule Take 50 mg by mouth 3 (three) times daily. (Patient not taking: Reported on 09/24/2022)   tiZANidine (ZANAFLEX) 2 MG tablet Take 1 tablet by mouth 3 (three) times daily as needed for muscle spasms.  (Patient not taking: Reported on 09/04/2022)   traZODone (DESYREL) 150 MG tablet Take 150 mg by mouth at bedtime.  (Patient not taking: Reported on 09/04/2022)   No facility-administered encounter medications on file as of 09/24/2022.     Patient Active Problem List   Diagnosis Date Noted   Trigger  thumb of left hand 01/10/2022   Complex regional pain syndrome of lower limb 02/13/2021   Sacroiliac joint pain 01/07/2021   Nonspecific abnormal electrocardiogram (ECG) (EKG) 11/03/2020   Dyspnea on exertion 08/19/2019   Degeneration of lumbar intervertebral disc 05/10/2019   Obese 11/11/2018   S/P left TKA 11/10/2018   Pain in left knee 01/08/2018   Genetic testing 12/17/2017   Chronic low back pain 07/30/2017   Lumbar post-laminectomy syndrome 07/30/2017   Prolactinoma (HCC) 07/16/2017   UTI (urinary tract infection) 07/10/2017   Near syncope 07/10/2017   Lactic acidosis 07/10/2017   Long-term current use of opiate analgesic 05/28/2017   Endometrial  cancer (HCC) 01/10/2016   Severe sepsis with septic shock (HCC) 03/25/2014   AKI (acute kidney injury) (HCC) 03/25/2014   Benign essential HTN 03/25/2014   Obstructive uropathy 03/25/2014   Hyperlipidemia 03/25/2014   Acute postoperative respiratory failure (HCC) 03/25/2014   Chronic pain syndrome 02/23/2014   Fibromyalgia 09/08/2012   Insomnia 09/08/2012   Obesity (BMI 30-39.9) 09/02/2012   Expected blood loss anemia 09/02/2012   S/P right TKA 08/31/2012   HYPERTENSION, BENIGN SYSTEMIC 06/26/2006   NEPHROLITHIASIS 06/26/2006     Health Maintenance Due  Topic Date Due   COVID-19 Vaccine (1) Never done   Hepatitis C Screening  Never done   MAMMOGRAM  10/16/2012   Pneumonia Vaccine 20+ Years old (1 of 1 - PCV) Never done   DEXA SCAN  Never done   Medicare Annual Wellness (AWV)  12/20/2021     Review of Systems  Physical Exam   BP 116/71   Pulse 60   Temp 97.9 F (36.6 C) (Oral)   Wt 188 lb (85.3 kg)   SpO2 96%   BMI 34.39 kg/m    No results found for: "CD4TCELL" No results found for: "CD4TABS" No results found for: "HIV1RNAQUANT" No results found for: "HEPBSAB" No results found for: "RPR", "LABRPR"  CBC Lab Results  Component Value Date   WBC 9.8 01/01/2022   RBC 4.86 01/01/2022   HGB 14.5 01/01/2022   HCT 44.6 01/01/2022   PLT 297 01/01/2022   MCV 91.8 01/01/2022   MCH 29.8 01/01/2022   MCHC 32.5 01/01/2022   RDW 14.2 01/01/2022   LYMPHSABS 2.6 01/01/2022   MONOABS 0.9 01/01/2022   EOSABS 0.2 01/01/2022    BMET Lab Results  Component Value Date   NA 142 01/01/2022   K 3.9 01/01/2022   CL 107 01/01/2022   CO2 25 01/01/2022   GLUCOSE 120 (H) 01/01/2022   BUN 22 01/01/2022   CREATININE 0.92 01/01/2022   CALCIUM 9.6 01/01/2022   GFRNONAA >60 01/01/2022   GFRAA 59 (L) 12/03/2019      Assessment and Plan Hx of esbl  Check ua and urine culture Do a trial of methamine  Sees dr borden on Friday. Unsure if she has any current stones.hx of  lithotripsy

## 2022-09-25 LAB — URINE CULTURE
MICRO NUMBER:: 15009267
SPECIMEN QUALITY:: ADEQUATE

## 2022-09-25 LAB — URINALYSIS, ROUTINE W REFLEX MICROSCOPIC
Bilirubin Urine: NEGATIVE
Glucose, UA: NEGATIVE
Hgb urine dipstick: NEGATIVE
Ketones, ur: NEGATIVE
Nitrite: NEGATIVE
Protein, ur: NEGATIVE
Specific Gravity, Urine: 1.021 (ref 1.001–1.035)
WBC, UA: >=60 /HPF — AB (ref 0–5)
pH: 6 (ref 5.0–8.0)

## 2022-09-25 LAB — MICROSCOPIC MESSAGE

## 2022-09-26 ENCOUNTER — Encounter: Payer: Self-pay | Admitting: Internal Medicine

## 2022-09-26 ENCOUNTER — Ambulatory Visit (HOSPITAL_COMMUNITY): Payer: Medicare HMO

## 2022-09-26 ENCOUNTER — Encounter (HOSPITAL_COMMUNITY): Payer: Self-pay

## 2022-09-26 ENCOUNTER — Encounter: Payer: Self-pay | Admitting: *Deleted

## 2022-09-27 DIAGNOSIS — N200 Calculus of kidney: Secondary | ICD-10-CM | POA: Diagnosis not present

## 2022-10-01 ENCOUNTER — Telehealth: Payer: Self-pay | Admitting: *Deleted

## 2022-10-01 NOTE — Telephone Encounter (Signed)
PC to patient, informed her authorization has been obtained her her head CT & that she may schedule it at any time by [employer], [phone].  Patient verbalizes understanding & will schedule scan.

## 2022-10-01 NOTE — Telephone Encounter (Signed)
Per patient, she has had a spinal cord implant since 2015 which is the reason MRI's are contraindicated for her.

## 2022-10-07 ENCOUNTER — Other Ambulatory Visit: Payer: Self-pay | Admitting: *Deleted

## 2022-10-07 ENCOUNTER — Inpatient Hospital Stay: Payer: Medicare HMO | Admitting: Internal Medicine

## 2022-10-07 DIAGNOSIS — D352 Benign neoplasm of pituitary gland: Secondary | ICD-10-CM

## 2022-10-17 ENCOUNTER — Ambulatory Visit: Payer: Medicare HMO | Admitting: Internal Medicine

## 2022-10-22 ENCOUNTER — Ambulatory Visit (HOSPITAL_COMMUNITY)
Admission: RE | Admit: 2022-10-22 | Discharge: 2022-10-22 | Disposition: A | Discharge status: Home / Self Care | Payer: Medicare HMO | Source: Ambulatory Visit | Attending: Internal Medicine | Admitting: Internal Medicine

## 2022-10-22 DIAGNOSIS — E236 Other disorders of pituitary gland: Secondary | ICD-10-CM | POA: Diagnosis not present

## 2022-10-22 DIAGNOSIS — I6782 Cerebral ischemia: Secondary | ICD-10-CM | POA: Diagnosis not present

## 2022-10-22 DIAGNOSIS — G9389 Other specified disorders of brain: Secondary | ICD-10-CM | POA: Diagnosis not present

## 2022-10-22 DIAGNOSIS — R519 Headache, unspecified: Secondary | ICD-10-CM | POA: Diagnosis not present

## 2022-10-22 DIAGNOSIS — D352 Benign neoplasm of pituitary gland: Secondary | ICD-10-CM | POA: Insufficient documentation

## 2022-10-22 MED ORDER — IOHEXOL 300 MG/ML  SOLN
75.0000 mL | Freq: Once | INTRAMUSCULAR | Status: AC | PRN
  Administered 2022-10-22: 75 mL via INTRAVENOUS

## 2022-10-22 MED ORDER — SODIUM CHLORIDE (PF) 0.9 % IJ SOLN
INTRAMUSCULAR | Status: AC
  Filled 2022-10-22: qty 50

## 2022-10-28 ENCOUNTER — Inpatient Hospital Stay: Payer: Medicare HMO | Attending: Internal Medicine | Admitting: Internal Medicine

## 2022-10-28 ENCOUNTER — Other Ambulatory Visit: Payer: Self-pay

## 2022-10-28 VITALS — BP 152/63 | HR 60 | Temp 97.7°F | Resp 13 | Wt 191.7 lb

## 2022-10-28 DIAGNOSIS — Z79899 Other long term (current) drug therapy: Secondary | ICD-10-CM | POA: Insufficient documentation

## 2022-10-28 DIAGNOSIS — D352 Benign neoplasm of pituitary gland: Secondary | ICD-10-CM | POA: Diagnosis not present

## 2022-10-28 NOTE — Progress Notes (Signed)
[facility] [address]  Kimberly Larsen, [place] [address] [phone]   Interval Evaluation  Date of Service: 10/28/22 Patient Name: Kimberly Larsen Patient MRN: [id number] Patient DOB: 17-May-1945 Provider: Henreitta Leber, MD  Identifying Statement:  Kimberly Larsen is a 77 y.o. female with  sellar   macroadenoma    Interval History:  Kimberly Larsen presents today after recent CT head.  She continues to take cabergoline through Dr. Sharl Ma.  No new or progressive symptoms today.  Denies headaches, seizures.  Has upcoming surgery scheduled for trigger finger.  Medications: Current Outpatient Medications on File Prior to Visit  Medication Sig Dispense Refill   acetaminophen (TYLENOL) 325 MG tablet Take 650 mg by mouth every 6 (six) hours as needed.     aspirin 81 MG EC tablet Chew 1 tablet by mouth daily.     AZO D-MANNOSE PO Take by mouth.     cabergoline (DOSTINEX) 0.5 MG tablet Take 0.5 tablets (0.25 mg total) by mouth See admin instructions. Patient takes tablets 1 mg on Tuesday and friday (Patient taking differently: Take 1 mg by mouth See admin instructions. Patient takes tablets 1 mg on Tuesday and friday) 10 tablet    calcium citrate (CALCITRATE - DOSED IN MG ELEMENTAL CALCIUM) 950 (200 Ca) MG tablet Take 630 mg of elemental calcium by mouth daily.     clonazePAM (KLONOPIN) 0.25 MG disintegrating tablet Take 0.25 mg by mouth 2 (two) times daily.     cloNIDine (CATAPRES) 0.1 MG tablet Take 0.1 mg by mouth at bedtime.     conjugated estrogens (PREMARIN) vaginal cream Place 1 Applicatorful vaginally daily. 42.5 g 12   conjugated estrogens (PREMARIN) vaginal cream Place 1 applicator vaginally daily.     docusate sodium (COLACE) 50 MG capsule Take 50 mg by mouth at bedtime.  (Patient not taking: Reported on 09/24/2022)     fluticasone (FLONASE) 50 MCG/ACT nasal spray Place 1 spray into both nostrils daily as needed for allergies.      fosfomycin (MONUROL)  3 g PACK Take by mouth.     levothyroxine (SYNTHROID) 25 MCG tablet Take 25 mcg by mouth daily before breakfast.      losartan (COZAAR) 50 MG tablet Take 50 mg by mouth daily.     Magnesium 400 MG TABS Take 400 mg by mouth daily.     Melatonin 10 MG TABS Take 10 mg by mouth at bedtime.     methocarbamol (ROBAXIN) 500 MG tablet Take 500 mg by mouth 4 (four) times daily.     metoprolol succinate (TOPROL-XL) 25 MG 24 hr tablet Take 25 mg by mouth daily.   0   montelukast (SINGULAIR) 10 MG tablet Take 10 mg by mouth at bedtime.   1   Multiple Vitamins-Minerals (HAIR SKIN & NAILS ADVANCED PO) Take by mouth.     Omega-3 Fatty Acids (FISH OIL) 1000 MG CAPS Take 1 capsule by mouth in the morning and at bedtime.      ondansetron (ZOFRAN) 4 MG tablet Take 4 mg by mouth every 8 (eight) hours as needed for nausea or vomiting.     pantoprazole (PROTONIX) 40 MG tablet Take 40 mg by mouth daily.     polyethylene glycol (MIRALAX / GLYCOLAX) 17 g packet Take 17 g by mouth 2 (two) times daily. (Patient not taking: Reported on 09/24/2022) 28 packet 0   pravastatin (PRAVACHOL) 40 MG tablet Take 1 tablet (40 mg total) by  mouth at bedtime. 30 tablet 0   pregabalin (LYRICA) 50 MG capsule Take 50 mg by mouth 3 (three) times daily. (Patient not taking: Reported on 09/24/2022)     sertraline (ZOLOFT) 25 MG tablet Take 25 mg by mouth daily.     tiZANidine (ZANAFLEX) 2 MG tablet Take 1 tablet by mouth 3 (three) times daily as needed for muscle spasms.  (Patient not taking: Reported on 09/04/2022)     traZODone (DESYREL) 150 MG tablet Take 150 mg by mouth at bedtime.  (Patient not taking: Reported on 09/04/2022)     No current facility-administered medications on file prior to visit.    Allergies:  Allergies  Allergen Reactions   Amoxicillin-Pot Clavulanate Nausea Only    Other reaction(s): stomach upset   Bupropion Nausea Only    Other reaction(s): stomach upset   Past Medical History:  Past Medical History:   Diagnosis Date   Acute postoperative respiratory failure (HCC) 03/25/2014   AKI (acute kidney injury) (HCC) 03/25/2014   Anesthesia of skin    Anxiety    Aortic atherosclerosis (HCC)    Arthritis    Back pain    Benign essential HTN 03/25/2014   Cancer (HCC)    endometrial cancer   Chronic daily headache 10/23/2017   Chronic low back pain 07/30/2017   Chronic pain syndrome 02/23/2014   DDD (degenerative disc disease), lumbar    Degeneration of lumbar intervertebral disc 05/10/2019   Depression    Endometrial cancer (HCC) 01/10/2016   Expected blood loss anemia 09/02/2012   Fibromyalgia 09/08/2012   Gastroparesis    Genetic testing 12/17/2017   TumorNext [facility] CancerNext was ordered. The CancerNext gene panel offered by [employer] includes sequencing and rearrangement analysis for the following 32 genes:   APC, ATM, BARD1, BMPR1A, BRCA1, BRCA2, BRIP1, CDH1, CDK4, CDKN2A, CHEK2, DICER1, EPCAM, GREM1, HOXB13MLH1, MRE11A, MSH2, MSH6, MUTYH, NBN, NF1, PALB2, PMS2, POLD1, POLE, PTEN, RAD50, RAD51D, SMAD4, SMARCA4, STK11, and TP53.   Ger   History of kidney stones    HLD (hyperlipidemia)    Hx of sepsis 02/2014   DUE TO MULTIPLE KIDNEY STONES   Hyperlipemia    Hyperlipidemia 03/25/2014   Hypertension    HYPERTENSION, BENIGN SYSTEMIC 06/26/2006   Qualifier: Diagnosis of  By: Bebe Shaggy     Insomnia 09/08/2012   Lactic acidosis 07/10/2017   Long-term current use of opiate analgesic 05/28/2017   Lumbar post-laminectomy syndrome 07/30/2017   Near syncope 07/10/2017   NEPHROLITHIASIS 06/26/2006   Qualifier: Diagnosis of  By: Bebe Shaggy     Obese 11/11/2018   Obesity (BMI 30-39.9) 09/02/2012   Obstructive uropathy 03/25/2014   Osteoporosis    Pain in left knee 01/08/2018   Pneumonia    2014   Prolactinoma (HCC) 07/16/2017   S/P left TKA 11/10/2018   S/P right TKA 08/31/2012   Severe sepsis with septic shock (HCC) 03/25/2014   UTI (urinary tract infection)    Past Surgical  History:  Past Surgical History:  Procedure Laterality Date   BACK SURGERY  2013   CARPAL TUNNEL RELEASE     R hand   CYSTOSCOPY WITH URETEROSCOPY AND STENT PLACEMENT Bilateral 03/25/2014   Procedure: CYSTOSCOPY WITH URETEROSCOPY AND STENT PLACEMENT;  Surgeon: Heloise Purpura, MD;  Location: [facility];  Service: Urology;  Laterality: Bilateral;   CYSTOSCOPY WITH URETEROSCOPY AND STENT PLACEMENT Bilateral 04/18/2014   Procedure: CYSTOSCOPY WITH URETEROSCOPY AND STENT PLACEMENT,RETROGRADE;  Surgeon: Heloise Purpura, MD;  Location: [facility];  Service:  Urology;  Laterality: Bilateral;   CYSTOSCOPY WITH URETEROSCOPY AND STENT PLACEMENT Right 05/30/2014   Procedure: CYSTOSCOPY WITH URETEROSCOPY AND STENT PLACEMENT;  Surgeon: Heloise Purpura, MD;  Location: [facility];  Service: Urology;  Laterality: Right;   CYSTOSCOPY/RETROGRADE/URETEROSCOPY Left 05/30/2014   Procedure: LEFT RETROGRADE;  Surgeon: Heloise Purpura, MD;  Location: [facility];  Service: Urology;  Laterality: Left;   CYSTOSCOPY/URETEROSCOPY/HOLMIUM LASER/STENT PLACEMENT Left 11/25/2019   Procedure: CYSTOSCOPY/RETROGRADE/URETEROSCOPY/HOLMIUM LASER/STENT PLACEMENT;  Surgeon: Heloise Purpura, MD;  Location: [facility];  Service: Urology;  Laterality: Left;   EYE SURGERY     cataract surgery bil   GANGLION CYST EXCISION     L ankle   HAMMERTOE RECONSTRUCTION WITH WEIL OSTEOTOMY Left 09/05/2016   Procedure: Second Metatarsal Weil Osteotomy and Hammertoe Correction;  Surgeon: Toni Arthurs, MD;  Location: [facility];  Service: Orthopedics;  Laterality: Left;   HOLMIUM LASER APPLICATION Bilateral 04/18/2014   Procedure: HOLMIUM LASER APPLICATION;  Surgeon: Heloise Purpura, MD;  Location: [facility];  Service: Urology;  Laterality: Bilateral;   JOINT REPLACEMENT     total knee   LITHOTRIPSY     x 3   LUMBAR FUSION  09/2011   METATARSAL OSTEOTOMY WITH BUNIONECTOMY Left 09/05/2016   Procedure: Left First Metatarsal Scarf Osteotomy, Modified McBride Bunion  Correction;  Surgeon: Toni Arthurs, MD;  Location: [facility];  Service: Orthopedics;  Laterality: Left;   RHINOPLASTY     ROBOTIC ASSISTED TOTAL HYSTERECTOMY WITH BILATERAL SALPINGO OOPHERECTOMY Bilateral 01/16/2016   Procedure: XI ROBOTIC ASSISTED TOTAL HYSTERECTOMY WITH BILATERAL SALPINGO OOPHORECTOMY AND BILATERAL PELVIC LYMPH NODE DISSECTION;  Surgeon: Adolphus Birchwood, MD;  Location: [facility];  Service: Gynecology;  Laterality: Bilateral;   SPINAL CORD STIMULATOR INSERTION N/A 02/23/2014   Procedure: SPINAL CORD STIMULATOR PLACEMENT ;  Surgeon: Venita Lick, MD;  Location: [facility];  Service: Orthopedics;  Laterality: N/A;   TONSILLECTOMY     TOTAL KNEE ARTHROPLASTY Right 08/31/2012   Procedure: RIGHT TOTAL KNEE ARTHROPLASTY;  Surgeon: [name], MD;  Location: [facility];  Service: Orthopedics;  Laterality: Right;  with LMA   TOTAL KNEE ARTHROPLASTY Left 11/10/2018   Procedure: TOTAL KNEE ARTHROPLASTY;  Surgeon: Durene Romans, MD;  Location: [facility];  Service: Orthopedics;  Laterality: Left;  70 mins   TUBAL LIGATION     Social History:  Social History   Socioeconomic History   Marital status: Divorced    Spouse name: Not on file   Number of children: 1   Years of education: 12   Highest education level: Not on file  Occupational History   Not on file  Tobacco Use   Smoking status: Never    Passive exposure: Never   Smokeless tobacco: Never  Vaping Use   Vaping Use: Never used  Substance and Sexual Activity   Alcohol use: No   Drug use: No   Sexual activity: Yes  Other Topics Concern   Not on file  Social History Narrative   Lives alone   Social Determinants of Health   Financial Resource Strain: Not on file  Food Insecurity: Not on file  Transportation Needs: Not on file  Physical Activity: Not on file  Stress: Not on file  Social Connections: Not on file  Intimate Partner Violence: Not on file   Family History:  Family History  Problem Relation Age of  Onset   Stroke Mother    Emphysema Mother    COPD Mother    Hypertension Mother    Stroke  Father    Heart disease Father    Emphysema Father    Hypertension Father    Heart disease Brother     Review of Systems: Constitutional: Denies fevers, chills or abnormal weight loss Eyes: Denies blurriness of vision Ears, nose, mouth, throat, and face: Denies mucositis or sore throat Respiratory: Denies cough, dyspnea or wheezes Cardiovascular: Denies palpitation, chest discomfort or lower extremity swelling Gastrointestinal:  Denies nausea, constipation, diarrhea GU: Denies dysuria or incontinence Skin: Denies abnormal skin rashes Neurological: Per HPI Musculoskeletal: Back pain, knee pain Behavioral/Psych: +anxiety  Physical Exam: Vitals:   10/28/22 1032  BP: (!) 152/63  Pulse: 60  Resp: 13  Temp: 97.7 F (36.5 C)  SpO2: 97%    KPS: 90. General: Alert, cooperative, pleasant, in no acute distress Head: Craniotomy scar noted, dry and intact. EENT: No conjunctival injection or scleral icterus. Oral mucosa moist Lungs: Resp effort normal Cardiac: Regular rate and rhythm Abdomen: Soft, non-distended abdomen Skin: No rashes cyanosis or petechiae. Extremities: No clubbing or edema  Neurologic Exam: Mental Status: Awake, alert, attentive to examiner. Oriented to self and environment. Language is fluent with intact comprehension.  Cranial Nerves: Visual acuity is grossly normal. Visual fields are full. Extra-ocular movements intact. No ptosis. Face is symmetric, tongue midline. Motor: Tone and bulk are normal. Power is full in both arms and legs. Reflexes are symmetric, no pathologic reflexes present. Intact finger to nose bilaterally Sensory: Intact to light touch and temperature Gait: Normal and tandem gait is normal.   Labs: I have reviewed the data as listed    Component Value Date/Time   NA 142 01/01/2022 2206   NA 141 01/10/2016 1412   K 3.9 01/01/2022 2206   K 4.0  01/10/2016 1412   CL 107 01/01/2022 2206   CO2 25 01/01/2022 2206   CO2 30 (H) 01/10/2016 1412   GLUCOSE 120 (H) 01/01/2022 2206   GLUCOSE 102 01/10/2016 1412   BUN 22 01/01/2022 2206   BUN 19.3 01/10/2016 1412   CREATININE 0.92 01/01/2022 2206   CREATININE 0.91 10/01/2019 1132   CREATININE 0.9 01/10/2016 1412   CALCIUM 9.6 01/01/2022 2206   CALCIUM 9.8 01/10/2016 1412   PROT 6.6 01/01/2022 2206   PROT 6.2 02/15/2020 1519   ALBUMIN 3.9 01/01/2022 2206   AST 28 01/01/2022 2206   AST 18 10/01/2019 1132   ALT 28 01/01/2022 2206   ALT 21 10/01/2019 1132   ALKPHOS 108 01/01/2022 2206   BILITOT 0.6 01/01/2022 2206   BILITOT 0.7 10/01/2019 1132   GFRNONAA >60 01/01/2022 2206   GFRNONAA >60 10/01/2019 1132   GFRAA 59 (L) 12/03/2019 1603   GFRAA >60 10/01/2019 1132   Lab Results  Component Value Date   WBC 9.8 01/01/2022   NEUTROABS 5.9 01/01/2022   HGB 14.5 01/01/2022   HCT 44.6 01/01/2022   MCV 91.8 01/01/2022   PLT 297 01/01/2022   Component     Latest Ref Rng & Units 07/12/2017  Triiodothyronine,Free,Serum     2.0 - 4.4 pg/mL 1.4 (L)  T4,Free(Direct)     0.61 - 1.12 ng/dL 5.40 (L)  FSH     mIU/mL 0.7  Somatomedin C     38 - 163 ng/mL 108  LH     mIU/mL <0.2  Prolactin     4.8 - 23.3 ng/mL 4,548.0 (H)  C206 ACTH     7.2 - 63.3 pg/mL 15.4    Imaging:  [facility] Clinician Interpretation: I have personally reviewed the CNS  images as listed.  My interpretation, in the context of the patient's clinical presentation, is stable disease pending official read  No results found.    Assessment/Plan 1. Pituitary macroadenoma [facility])  Ms. Whitford is clinically and radiographically stable today regarding her prolactinoma.  CT official read is pending.  She should continue to take cabergoline via endocrinology.  We appreciate the opportunity to participate in the care of DEZAREA HEADRICK.   We ask that Kimberly Larsen return to clinic in 12 months following next brain CT, or  sooner as needed.  All questions were answered. The patient knows to call the clinic with any problems, questions or concerns. No barriers to learning were detected.  The total time spent in the encounter was 30 minutes and more than 50% was on counseling and review of test results   Henreitta Leber, MD Medical Director of Neuro-Oncology [facility] 10/28/22 10:26 AM

## 2022-11-06 ENCOUNTER — Encounter: Payer: Self-pay | Admitting: Internal Medicine

## 2022-11-06 ENCOUNTER — Ambulatory Visit: Payer: Medicare HMO | Admitting: Internal Medicine

## 2022-11-06 VITALS — BP 134/84 | HR 73 | Temp 98.1°F | Resp 18 | Ht 62.0 in | Wt 191.0 lb

## 2022-11-06 DIAGNOSIS — G894 Chronic pain syndrome: Secondary | ICD-10-CM

## 2022-11-06 DIAGNOSIS — R739 Hyperglycemia, unspecified: Secondary | ICD-10-CM

## 2022-11-06 DIAGNOSIS — M797 Fibromyalgia: Secondary | ICD-10-CM | POA: Diagnosis not present

## 2022-11-06 DIAGNOSIS — G4700 Insomnia, unspecified: Secondary | ICD-10-CM | POA: Diagnosis not present

## 2022-11-06 MED ORDER — METHOCARBAMOL 500 MG PO TABS: 500.0000 mg | ORAL_TABLET | Freq: Four times a day (QID) | ORAL | 1 refills | Status: DC

## 2022-11-06 MED ORDER — AMITRIPTYLINE HCL 50 MG PO TABS: 50.0000 mg | ORAL_TABLET | Freq: Every day | ORAL | 2 refills | Status: DC

## 2022-11-06 MED ORDER — SERTRALINE HCL 50 MG PO TABS: 50.0000 mg | ORAL_TABLET | Freq: Every day | ORAL | 3 refills | Status: DC

## 2022-11-06 MED ORDER — ONDANSETRON HCL 4 MG PO TABS: 4.0000 mg | ORAL_TABLET | Freq: Three times a day (TID) | ORAL | 0 refills | Status: DC | PRN

## 2022-11-06 MED ORDER — CLONIDINE HCL 0.1 MG PO TABS: 0.1000 mg | ORAL_TABLET | Freq: Every day | ORAL | 0 refills | Status: DC

## 2022-11-06 NOTE — Assessment & Plan Note (Signed)
She had an elevated glucose last year.  She feels tired and this is probably related to her insomnia but we will get some labs today.

## 2022-11-06 NOTE — Assessment & Plan Note (Signed)
She has chronic pain but she does not want any medications for this.  If we had to start her on meds, I would probably go to lyrica.  She has fibromyalgia and we will start her on amitriptyline as well.

## 2022-11-06 NOTE — Assessment & Plan Note (Signed)
She has fibromyalgia where we will start her on amitriptyline.

## 2022-11-06 NOTE — Progress Notes (Signed)
Office Visit  Subjective   [name]   DOB: Jul 24, 1945   Age: 77 y.o.   MRN: [id number]   Chief Complaint Chief Complaint  Patient presents with   New Patient (Initial Visit)    New patient      History of Present Illness Kimberly Larsen is a 77 yo female who presents today to establish care.  She was previously followed by Dr. Sudie Bailey but she switched to [facility] in 12/2021.    The patient tells me that she has had recurrent UTI's where she has had about 7 UTI's since 12/2021 and until 07/2022.  She had a home test kit that kept showing she was positive.  Her PCP at [facility] was treating it at that time.  She has seen both urology and infectious disease where she saw ID in 08/2022.  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  They did a trial of methamine.  She has a history of kidney stones but no recent problems with this.  The patient has a history of chronic pain syndrome where she has chronic low back pain and fibromyalgia.  She states she has had chronic low back pain since 2013.  She had a lumbar laminectomy and fusion in 2013.  She states she had a spinal stimulator placed in 2015 for her chronic pain but this did not help.  She was on percocet from 2013 until 2020.  She states the NP from [facility] put her on lyrica in 07/2022 and she states this has helped.  She has a history of lumbar DDD and DJD where she has had both of her knees replaced.  She also has arthritis in her hands.  The patient also has a history of fibromyalgia with pain with pain "all over".  Today, her worse pain in her body is in her left thumb.  She states she has trigger finger where she goes for surgery next week.  She has chronic low back pain that occurs all the time and is rated 5 out of 10 on the pain scale.  The only time she gets relief for this is when she lies down.  The patient  currently takes tylenol prn.  The patient also has a history of insomnia.  She states that over the years it has been hard for her to get to sleep.  She has tried melatonin.  She states she was placed on 3 different medicines (not ambien) where none of them worked.  Her previous NP placed her on clonazepam where she states it is not helping.  She states she has tried elavil in the past but they never went up on the dose.     Past Medical History Past Medical History:  Diagnosis Date   Acute postoperative respiratory failure (HCC) 03/25/2014   Anesthesia of skin    Anxiety    Aortic atherosclerosis (HCC)    Arthritis    Back pain    Benign essential HTN 03/25/2014   Cancer (HCC)    endometrial cancer   Chronic daily headache 10/23/2017   Chronic low back pain 07/30/2017   Chronic pain syndrome 02/23/2014   DDD (degenerative disc disease), lumbar    Depression    Endometrial cancer (HCC) 01/10/2016   Fibromyalgia 09/08/2012   Gastroparesis    Genetic testing 12/17/2017   TumorNext Lynch +  CancerNext was ordered. The CancerNext gene panel offered by [employer] includes sequencing and rearrangement analysis for the following 32 genes:   APC, ATM, BARD1, BMPR1A, BRCA1, BRCA2, BRIP1, CDH1, CDK4, CDKN2A, CHEK2, DICER1, EPCAM, GREM1, HOXB13MLH1, MRE11A, MSH2, MSH6, MUTYH, NBN, NF1, PALB2, PMS2, POLD1, POLE, PTEN, RAD50, RAD51D, SMAD4, SMARCA4, STK11, and TP53.   Ger   History of kidney stones    Hyperlipidemia 03/25/2014   Insomnia 09/08/2012   Long-term current use of opiate analgesic 05/28/2017   Lumbar post-laminectomy syndrome 07/30/2017   Near syncope 07/10/2017   NEPHROLITHIASIS 06/26/2006   Qualifier: Diagnosis of  By: Bebe Shaggy     Obese 11/11/2018   Obesity (BMI 30-39.9) 09/02/2012   Obstructive uropathy 03/25/2014   Osteoporosis    Prolactinoma (HCC) 07/16/2017   S/P left TKA 11/10/2018   S/P right TKA 08/31/2012   UTI (urinary tract infection)       Allergies Allergies  Allergen Reactions   Amoxicillin-Pot Clavulanate Nausea Only    Other reaction(s): stomach upset   Bupropion Nausea Only    Other reaction(s): stomach upset     Medications  Current Outpatient Medications:    acetaminophen (TYLENOL) 325 MG tablet, Take 650 mg by mouth every 6 (six) hours as needed., Disp: , Rfl:    aspirin 81 MG EC tablet, Chew 1 tablet by mouth daily., Disp: , Rfl:    AZO D-MANNOSE PO, Take by mouth., Disp: , Rfl:    cabergoline (DOSTINEX) 0.5 MG tablet, Take 0.5 tablets (0.25 mg total) by mouth See admin instructions. Patient takes tablets 1 mg on Tuesday and friday (Patient taking differently: Take 1 mg by mouth See admin instructions. Patient takes 0.25 mg on Tuesday and friday), Disp: 10 tablet, Rfl:    calcium citrate (CALCITRATE - DOSED IN MG ELEMENTAL CALCIUM) 950 (200 Ca) MG tablet, Take 630 mg of elemental calcium by mouth daily., Disp: , Rfl:    cloNIDine (CATAPRES) 0.1 MG tablet, Take 0.1 mg by mouth at bedtime., Disp: , Rfl:    conjugated estrogens (PREMARIN) vaginal cream, Place 1 applicator vaginally daily., Disp: , Rfl:    docusate sodium (COLACE) 50 MG capsule, Take 50 mg by mouth at bedtime., Disp: , Rfl:    fluticasone (FLONASE) 50 MCG/ACT nasal spray, Place 1 spray into both nostrils daily as needed for allergies. , Disp: , Rfl:    levothyroxine (SYNTHROID) 25 MCG tablet, Take 25 mcg by mouth daily before breakfast. , Disp: , Rfl:    losartan (COZAAR) 50 MG tablet, Take 50 mg by mouth daily., Disp: , Rfl:    Magnesium 400 MG TABS, Take 400 mg by mouth daily., Disp: , Rfl:    Melatonin 10 MG TABS, Take 10 mg by mouth at bedtime., Disp: , Rfl:    methocarbamol (ROBAXIN) 500 MG tablet, Take 500 mg by mouth 4 (four) times daily., Disp: , Rfl:    metoprolol succinate (TOPROL-XL) 25 MG 24 hr tablet, Take 25 mg by mouth daily. , Disp: , Rfl: 0   montelukast (SINGULAIR) 10 MG tablet, Take 10 mg by mouth at bedtime. , Disp: , Rfl: 1    Multiple Vitamins-Minerals (HAIR SKIN & NAILS ADVANCED PO), Take by mouth., Disp: , Rfl:    Omega-3 Fatty Acids (FISH OIL) 1000 MG CAPS, Take 1 capsule by mouth in the morning and at bedtime. , Disp: , Rfl:    ondansetron (ZOFRAN) 4 MG tablet, Take 4 mg by mouth every 8 (eight) hours as needed for nausea or  vomiting., Disp: , Rfl:    pantoprazole (PROTONIX) 40 MG tablet, Take 40 mg by mouth daily., Disp: , Rfl:    polyethylene glycol (MIRALAX / GLYCOLAX) 17 g packet, Take 17 g by mouth 2 (two) times daily., Disp: 28 packet, Rfl: 0   pravastatin (PRAVACHOL) 40 MG tablet, Take 1 tablet (40 mg total) by mouth at bedtime., Disp: 30 tablet, Rfl: 0   sertraline (ZOLOFT) 25 MG tablet, Take 50 mg by mouth daily., Disp: , Rfl:    Review of Systems Review of Systems  Constitutional:  Negative for chills, fever, malaise/fatigue and weight loss.  Respiratory:  Negative for cough and shortness of breath.   Cardiovascular:  Negative for chest pain, palpitations and leg swelling.  Gastrointestinal:  Negative for abdominal pain, constipation, diarrhea, heartburn, nausea and vomiting.  Musculoskeletal:  Negative for myalgias.  Skin:  Negative for itching and rash.  Neurological:  Negative for dizziness, weakness and headaches.       Objective:    Vitals BP 134/84 (BP Location: Left Arm, Patient Position: Sitting, Cuff Size: Normal)   Pulse 73   Temp 98.1 F (36.7 C)   Resp 18   Ht 5\' 2"  (1.575 m)   Wt 191 lb (86.6 kg)   SpO2 95%   BMI 34.93 kg/m    Physical Examination Physical Exam     Assessment & Plan:   Chronic pain syndrome She has chronic pain but she does not want any medications for this.  If we had to start her on meds, I would probably go to lyrica.  She has fibromyalgia and we will start her on amitriptyline as well.    Fibromyalgia She has fibromyalgia where we will start her on amitriptyline.  Insomnia Amitriptyline should also help with her sleep.  I will start her on 50mg   night and if after 2 weeks this is not helping, she can go to 100mg  at bedtime.  Hyperglycemia She had an elevated glucose last year.  She feels tired and this is probably related to her insomnia but we will get some labs today.    Return in about 3 months (around 02/06/2023).   Crist Fat, MD

## 2022-11-06 NOTE — Assessment & Plan Note (Signed)
Amitriptyline should also help with her sleep.  I will start her on 50mg  night and if after 2 weeks this is not helping, she can go to 100mg  at bedtime.

## 2022-11-07 ENCOUNTER — Ambulatory Visit: Payer: Medicare HMO | Admitting: Internal Medicine

## 2022-11-07 LAB — CBC WITH DIFFERENTIAL/PLATELET
Basophils Absolute: 0.1 10*3/uL (ref 0.0–0.2)
Basos: 1 %
EOS (ABSOLUTE): 0.2 10*3/uL (ref 0.0–0.4)
Eos: 3 %
Hematocrit: 44.2 % (ref 34.0–46.6)
Hemoglobin: 14.3 g/dL (ref 11.1–15.9)
Immature Grans (Abs): 0 10*3/uL (ref 0.0–0.1)
Immature Granulocytes: 0 %
Lymphocytes Absolute: 2.1 10*3/uL (ref 0.7–3.1)
Lymphs: 26 %
MCH: 30.5 pg (ref 26.6–33.0)
MCHC: 32.4 g/dL (ref 31.5–35.7)
MCV: 94 fL (ref 79–97)
Monocytes Absolute: 0.9 10*3/uL (ref 0.1–0.9)
Monocytes: 11 %
Neutrophils Absolute: 4.7 10*3/uL (ref 1.4–7.0)
Neutrophils: 59 %
Platelets: 287 10*3/uL (ref 150–450)
RBC: 4.69 x10E6/uL (ref 3.77–5.28)
RDW: 13 % (ref 11.7–15.4)
WBC: 8 10*3/uL (ref 3.4–10.8)

## 2022-11-07 LAB — CMP14 + ANION GAP
ALT: 17 IU/L (ref 0–32)
AST: 19 IU/L (ref 0–40)
Albumin: 4.3 g/dL (ref 3.8–4.8)
Alkaline Phosphatase: 116 IU/L (ref 44–121)
Anion Gap: 15 mmol/L (ref 10.0–18.0)
BUN/Creatinine Ratio: 30 — ABNORMAL HIGH (ref 12–28)
BUN: 27 mg/dL (ref 8–27)
Bilirubin Total: 0.3 mg/dL (ref 0.0–1.2)
CO2: 20 mmol/L (ref 20–29)
Calcium: 10.7 mg/dL — ABNORMAL HIGH (ref 8.7–10.3)
Chloride: 104 mmol/L (ref 96–106)
Creatinine, Ser: 0.9 mg/dL (ref 0.57–1.00)
Globulin, Total: 2.2 g/dL (ref 1.5–4.5)
Glucose: 94 mg/dL (ref 70–99)
Potassium: 5 mmol/L (ref 3.5–5.2)
Sodium: 139 mmol/L (ref 134–144)
Total Protein: 6.5 g/dL (ref 6.0–8.5)
eGFR: 67 mL/min/{1.73_m2} (ref >59)

## 2022-11-07 LAB — HEMOGLOBIN A1C
Est. average glucose Bld gHb Est-mCnc: 120 mg/dL
Hgb A1c MFr Bld: 5.8 % — ABNORMAL HIGH (ref 4.8–5.6)

## 2022-11-07 LAB — TSH: TSH: 1.02 u[IU]/mL (ref 0.450–4.500)

## 2022-11-08 ENCOUNTER — Other Ambulatory Visit: Payer: Self-pay

## 2022-11-08 MED ORDER — METOPROLOL SUCCINATE ER 25 MG PO TB24: 25.0000 mg | ORAL_TABLET | Freq: Every day | ORAL | 1 refills | Status: DC

## 2022-11-08 NOTE — Progress Notes (Signed)
Pt notified of lab results

## 2022-11-11 DIAGNOSIS — M65312 Trigger thumb, left thumb: Secondary | ICD-10-CM | POA: Diagnosis not present

## 2022-11-25 DIAGNOSIS — M25642 Stiffness of left hand, not elsewhere classified: Secondary | ICD-10-CM | POA: Diagnosis not present

## 2022-11-29 ENCOUNTER — Other Ambulatory Visit: Payer: Self-pay | Admitting: Internal Medicine

## 2022-12-11 ENCOUNTER — Ambulatory Visit: Payer: Medicare HMO | Admitting: Internal Medicine

## 2022-12-11 ENCOUNTER — Other Ambulatory Visit: Payer: Self-pay

## 2022-12-11 ENCOUNTER — Encounter: Payer: Self-pay | Admitting: Internal Medicine

## 2022-12-11 VITALS — BP 125/74 | HR 62 | Temp 97.9°F | Ht 62.0 in | Wt 192.0 lb

## 2022-12-11 DIAGNOSIS — F5101 Primary insomnia: Secondary | ICD-10-CM

## 2022-12-11 DIAGNOSIS — N390 Urinary tract infection, site not specified: Secondary | ICD-10-CM | POA: Diagnosis not present

## 2022-12-11 NOTE — Progress Notes (Signed)
[id number] uti  [name], female   DOB: Apr 02, 1946, 77 y.o.   MRN: [id number]  HPI 77yo F with insomnia, htn, hx of prolactinoma, recurrent uti. Here for follow up for urinary tract symptoms. She also sees dr Sharl Ma (endocrinologist) for prolactinoma. She has not had to be treated for repeat urinary tract infection. She is now using premarin every 4 days. No burning urination  Outpatient Encounter Medications as of 12/11/2022  Medication Sig   acetaminophen (TYLENOL) 325 MG tablet Take 650 mg by mouth every 6 (six) hours as needed.   amitriptyline (ELAVIL) 50 MG tablet TAKE 1 TABLET BY MOUTH EVERYDAY AT BEDTIME   aspirin 81 MG EC tablet Chew 1 tablet by mouth daily.   AZO D-MANNOSE PO Take by mouth.   cabergoline (DOSTINEX) 0.5 MG tablet Take 0.25 mg by mouth 2 (two) times a week.   calcium citrate (CALCITRATE - DOSED IN MG ELEMENTAL CALCIUM) 950 (200 Ca) MG tablet Take 630 mg of elemental calcium by mouth daily.   clonazePAM (KLONOPIN) 0.5 MG tablet 1 TAB BY MOUTH EVERY DAY AT BEDTIME ADMINISTER 30 MINUTES BEFORE BEDTIME   cloNIDine (CATAPRES) 0.1 MG tablet Take 1 tablet (0.1 mg total) by mouth at bedtime.   conjugated estrogens (PREMARIN) vaginal cream Place 1 applicator vaginally daily.   docusate sodium (COLACE) 50 MG capsule Take 50 mg by mouth at bedtime.   fluticasone (FLONASE) 50 MCG/ACT nasal spray Place 1 spray into both nostrils daily as needed for allergies.    levothyroxine (SYNTHROID) 25 MCG tablet Take 25 mcg by mouth daily before breakfast.    losartan (COZAAR) 50 MG tablet Take 50 mg by mouth daily.   Magnesium 400 MG TABS Take 400 mg by mouth daily.   Melatonin 10 MG TABS Take 10 mg by mouth at bedtime.   methocarbamol (ROBAXIN) 500 MG tablet Take 1 tablet (500 mg total) by mouth 4 (four) times daily.   metoprolol succinate (TOPROL-XL) 25 MG 24 hr tablet Take 1 tablet (25 mg total) by mouth daily.   montelukast (SINGULAIR) 10 MG tablet Take 10 mg by  mouth at bedtime.    Multiple Vitamins-Minerals (HAIR SKIN & NAILS ADVANCED PO) Take by mouth.   Omega-3 Fatty Acids (FISH OIL) 1000 MG CAPS Take 1 capsule by mouth in the morning and at bedtime.    ondansetron (ZOFRAN) 4 MG tablet Take 1 tablet (4 mg total) by mouth every 8 (eight) hours as needed for nausea or vomiting.   pantoprazole (PROTONIX) 40 MG tablet Take 40 mg by mouth daily.   polyethylene glycol (MIRALAX / GLYCOLAX) 17 g packet Take 17 g by mouth 2 (two) times daily.   pravastatin (PRAVACHOL) 40 MG tablet Take 1 tablet (40 mg total) by mouth at bedtime.   sertraline (ZOLOFT) 50 MG tablet Take 1 tablet (50 mg total) by mouth daily.   No facility-administered encounter medications on file as of 12/11/2022.     Patient Active Problem List   Diagnosis Date Noted   Hyperglycemia 11/06/2022   Trigger thumb of left hand 01/10/2022   Complex regional pain syndrome of lower limb 02/13/2021   Sacroiliac joint pain 01/07/2021   Nonspecific abnormal electrocardiogram (ECG) (EKG) 11/03/2020   Dyspnea on exertion 08/19/2019   Degeneration of lumbar intervertebral disc 05/10/2019   Obese 11/11/2018   S/P left TKA 11/10/2018   Pain in left knee 01/08/2018   Genetic testing 12/17/2017   Chronic low back pain 07/30/2017   Lumbar post-laminectomy  syndrome 07/30/2017   Prolactinoma (HCC) 07/16/2017   UTI (urinary tract infection) 07/10/2017   Near syncope 07/10/2017   Lactic acidosis 07/10/2017   Long-term current use of opiate analgesic 05/28/2017   Endometrial cancer (HCC) 01/10/2016   Severe sepsis with septic shock (HCC) 03/25/2014   AKI (acute kidney injury) (HCC) 03/25/2014   Benign essential HTN 03/25/2014   Obstructive uropathy 03/25/2014   Hyperlipidemia 03/25/2014   Acute postoperative respiratory failure (HCC) 03/25/2014   Chronic pain syndrome 02/23/2014   Fibromyalgia 09/08/2012   Insomnia 09/08/2012   Obesity (BMI 30-39.9) 09/02/2012   Expected blood loss anemia  09/02/2012   S/P right TKA 08/31/2012   HYPERTENSION, BENIGN SYSTEMIC 06/26/2006   NEPHROLITHIASIS 06/26/2006     Health Maintenance Due  Topic Date Due   COVID-19 Vaccine (1) Never done   Hepatitis C Screening  Never done   MAMMOGRAM  10/16/2012   Pneumonia Vaccine 53+ Years old (1 of 1 - PCV) Never done   DEXA SCAN  Never done   Medicare Annual Wellness (AWV)  12/20/2021   INFLUENZA VACCINE  11/28/2022     Review of Systems 12 point ros is otherwise negative, except insomnia Physical Exam   BP 125/74   Pulse 62   Temp 97.9 F (36.6 C) (Temporal)   Ht 5\' 2"  (1.575 m)   Wt 192 lb (87.1 kg)   SpO2 97%   BMI 35.12 kg/m   Gen = a x o by 3 in nad HEENT = [place]/AT, no scleral icterus   CBC Lab Results  Component Value Date   WBC 8.0 11/06/2022   RBC 4.69 11/06/2022   HGB 14.3 11/06/2022   HCT 44.2 11/06/2022   PLT 287 11/06/2022   MCV 94 11/06/2022   MCH 30.5 11/06/2022   MCHC 32.4 11/06/2022   RDW 13.0 11/06/2022   LYMPHSABS 2.1 11/06/2022   MONOABS 0.9 01/01/2022   EOSABS 0.2 11/06/2022    BMET Lab Results  Component Value Date   NA 139 11/06/2022   K 5.0 11/06/2022   CL 104 11/06/2022   CO2 20 11/06/2022   GLUCOSE 94 11/06/2022   BUN 27 11/06/2022   CREATININE 0.90 11/06/2022   CALCIUM 10.7 (H) 11/06/2022   GFRNONAA >60 01/01/2022   GFRAA 59 (L) 12/03/2019      Assessment and Plan Doing well without further need for abtx treatment. Has not had uti recurrence. Continue with Q 4 day intravaginal premarin cream. Should no systemically be absorbed  Insomina - currently on elavil but does not appear to have much impact .will follow up with her pcp. Recommended good sleep hygiene.  Rtc as needed

## 2022-12-11 NOTE — Patient Instructions (Signed)
For the time being : you have history of asymptomatic bactiuria

## 2022-12-18 ENCOUNTER — Other Ambulatory Visit: Payer: Self-pay

## 2022-12-20 ENCOUNTER — Other Ambulatory Visit: Payer: Self-pay

## 2022-12-20 DIAGNOSIS — E038 Other specified hypothyroidism: Secondary | ICD-10-CM | POA: Diagnosis not present

## 2022-12-20 DIAGNOSIS — E221 Hyperprolactinemia: Secondary | ICD-10-CM | POA: Diagnosis not present

## 2022-12-20 DIAGNOSIS — D352 Benign neoplasm of pituitary gland: Secondary | ICD-10-CM | POA: Diagnosis not present

## 2022-12-20 MED ORDER — LOSARTAN POTASSIUM 50 MG PO TABS: 50.0000 mg | ORAL_TABLET | Freq: Every day | ORAL | 1 refills | Status: DC

## 2022-12-20 MED ORDER — FLUTICASONE PROPIONATE 50 MCG/ACT NA SUSP: 1.0000 | Freq: Every day | NASAL | 1 refills | Status: DC | PRN

## 2022-12-20 NOTE — Progress Notes (Signed)
RX request.

## 2022-12-23 ENCOUNTER — Other Ambulatory Visit: Payer: Self-pay

## 2022-12-23 DIAGNOSIS — D352 Benign neoplasm of pituitary gland: Secondary | ICD-10-CM | POA: Diagnosis not present

## 2022-12-23 DIAGNOSIS — E221 Hyperprolactinemia: Secondary | ICD-10-CM | POA: Diagnosis not present

## 2022-12-23 DIAGNOSIS — E038 Other specified hypothyroidism: Secondary | ICD-10-CM | POA: Diagnosis not present

## 2022-12-23 MED ORDER — METHOCARBAMOL 500 MG PO TABS: 500.0000 mg | ORAL_TABLET | Freq: Four times a day (QID) | ORAL | 1 refills | Status: DC

## 2022-12-23 NOTE — Progress Notes (Signed)
RX refill

## 2022-12-27 ENCOUNTER — Encounter: Payer: Self-pay | Admitting: Internal Medicine

## 2022-12-27 ENCOUNTER — Other Ambulatory Visit: Payer: Self-pay | Admitting: Internal Medicine

## 2022-12-27 ENCOUNTER — Ambulatory Visit: Payer: Medicare HMO | Admitting: Internal Medicine

## 2022-12-27 VITALS — BP 140/80 | HR 86 | Temp 98.2°F | Resp 19 | Ht 62.0 in | Wt 196.4 lb

## 2022-12-27 DIAGNOSIS — G4700 Insomnia, unspecified: Secondary | ICD-10-CM

## 2022-12-27 DIAGNOSIS — J302 Other seasonal allergic rhinitis: Secondary | ICD-10-CM | POA: Diagnosis not present

## 2022-12-27 MED ORDER — AMITRIPTYLINE HCL 100 MG PO TABS: 100.0000 mg | ORAL_TABLET | Freq: Every day | ORAL | 2 refills | Status: DC

## 2022-12-27 NOTE — Assessment & Plan Note (Signed)
The patient has seasonal allergies and she states her zyrtec, flonase and her singulair are not helping.  I asked her to start on a neti pot.  We will refer her to allergy/immunology.

## 2022-12-27 NOTE — Progress Notes (Signed)
Office Visit  Subjective   [name]   DOB: 09-12-1945   Age: 77 y.o.   MRN: [id number]   Chief Complaint Chief Complaint  Patient presents with   Follow-up     History of Present Illness The patient returns for followup of her insomnia.  She states that over the years it has been hard for her to get to sleep.  She has tried melatonin.  She states she was placed on 3 different medicines (not ambien) where none of them worked.  Her previous NP placed her on clonazepam where she states it is not helping.  She states she has tried elavil in the past but they never went up on the dose.  I start her on amitriptyline 50mg  night over a month ago but she states this did not help.  She did not try to go to 100mg  at night.  Kimberly Larsen also tells me that over the last week she is having sinus congestion with headaches.  She normally does not have headaches but she is having maxillary and front sinus tenderness and is blowing out clear mucus from her nose.  There is some post nasal drainage.  There is no fevers, chills, chest congestion, SOB, wheezing or other problems.  She does take allegra but this does not help.  She is also on flonase and singulair but this has not helped.     Past Medical History Past Medical History:  Diagnosis Date   Acute postoperative respiratory failure (HCC) 03/25/2014   Anesthesia of skin    Anxiety    Aortic atherosclerosis (HCC)    Arthritis    Back pain    Benign essential HTN 03/25/2014   Cancer (HCC)    endometrial cancer   Chronic daily headache 10/23/2017   Chronic low back pain 07/30/2017   Chronic pain syndrome 02/23/2014   DDD (degenerative disc disease), lumbar    Depression    Endometrial cancer (HCC) 01/10/2016   Fibromyalgia 09/08/2012   Gastroparesis    Genetic testing 12/17/2017   TumorNext [facility] + CancerNext was ordered. The CancerNext gene panel offered by [employer] includes sequencing and rearrangement analysis for the  following 32 genes:   APC, ATM, BARD1, BMPR1A, BRCA1, BRCA2, BRIP1, CDH1, CDK4, CDKN2A, CHEK2, DICER1, EPCAM, GREM1, HOXB13MLH1, MRE11A, MSH2, MSH6, MUTYH, NBN, NF1, PALB2, PMS2, POLD1, POLE, PTEN, RAD50, RAD51D, SMAD4, SMARCA4, STK11, and TP53.   Ger   History of kidney stones    Hyperlipidemia 03/25/2014   Insomnia 09/08/2012   Long-term current use of opiate analgesic 05/28/2017   Lumbar post-laminectomy syndrome 07/30/2017   Near syncope 07/10/2017   NEPHROLITHIASIS 06/26/2006   Qualifier: Diagnosis of  By: Bebe Shaggy     Obese 11/11/2018   Obesity (BMI 30-39.9) 09/02/2012   Obstructive uropathy 03/25/2014   Osteoporosis    Prolactinoma (HCC) 07/16/2017   S/P left TKA 11/10/2018   S/P right TKA 08/31/2012   UTI (urinary tract infection)      Allergies Allergies  Allergen Reactions   Amoxicillin-Pot Clavulanate Nausea Only    Other reaction(s): stomach upset   Bupropion Nausea Only    Other reaction(s): stomach upset     Medications  Current Outpatient Medications:    acetaminophen (TYLENOL) 325 MG tablet, Take 650 mg by mouth every 6 (six) hours as needed., Disp: , Rfl:    aspirin 81 MG EC tablet, Chew 1 tablet by mouth daily., Disp: , Rfl:    AZO D-MANNOSE PO, Take  by mouth., Disp: , Rfl:    cabergoline (DOSTINEX) 0.5 MG tablet, Take 0.25 mg by mouth 2 (two) times a week., Disp: , Rfl:    calcium citrate (CALCITRATE - DOSED IN MG ELEMENTAL CALCIUM) 950 (200 Ca) MG tablet, Take 630 mg of elemental calcium by mouth daily., Disp: , Rfl:    clonazePAM (KLONOPIN) 0.5 MG tablet, 1 TAB BY MOUTH EVERY DAY AT BEDTIME ADMINISTER 30 MINUTES BEFORE BEDTIME, Disp: , Rfl:    cloNIDine (CATAPRES) 0.1 MG tablet, Take 1 tablet (0.1 mg total) by mouth at bedtime., Disp: 60 tablet, Rfl: 0   conjugated estrogens (PREMARIN) vaginal cream, Place 1 applicator vaginally daily., Disp: , Rfl:    docusate sodium (COLACE) 50 MG capsule, Take 50 mg by mouth at bedtime., Disp: , Rfl:     fluticasone (FLONASE) 50 MCG/ACT nasal spray, Place 1 spray into both nostrils daily as needed for allergies., Disp: 1 g, Rfl: 1   levothyroxine (SYNTHROID) 25 MCG tablet, Take 25 mcg by mouth daily before breakfast. , Disp: , Rfl:    losartan (COZAAR) 50 MG tablet, Take 1 tablet (50 mg total) by mouth daily., Disp: 90 tablet, Rfl: 1   Magnesium 400 MG TABS, Take 400 mg by mouth daily., Disp: , Rfl:    methocarbamol (ROBAXIN) 500 MG tablet, Take 1 tablet (500 mg total) by mouth 4 (four) times daily., Disp: 360 tablet, Rfl: 1   metoprolol succinate (TOPROL-XL) 25 MG 24 hr tablet, Take 1 tablet (25 mg total) by mouth daily., Disp: 90 tablet, Rfl: 1   montelukast (SINGULAIR) 10 MG tablet, Take 10 mg by mouth at bedtime. , Disp: , Rfl: 1   Multiple Vitamins-Minerals (HAIR SKIN & NAILS ADVANCED PO), Take by mouth., Disp: , Rfl:    Omega-3 Fatty Acids (FISH OIL) 1000 MG CAPS, Take 1 capsule by mouth in the morning and at bedtime. , Disp: , Rfl:    ondansetron (ZOFRAN) 4 MG tablet, Take 1 tablet (4 mg total) by mouth every 8 (eight) hours as needed for nausea or vomiting., Disp: 30 tablet, Rfl: 0   pantoprazole (PROTONIX) 40 MG tablet, Take 40 mg by mouth daily., Disp: , Rfl:    polyethylene glycol (MIRALAX / GLYCOLAX) 17 g packet, Take 17 g by mouth 2 (two) times daily., Disp: 28 packet, Rfl: 0   pravastatin (PRAVACHOL) 40 MG tablet, TAKE 1 TABLET BY MOUTH EVERY DAY, Disp: 90 tablet, Rfl: 1   sertraline (ZOLOFT) 50 MG tablet, Take 1 tablet (50 mg total) by mouth daily., Disp: 90 tablet, Rfl: 3   Review of Systems Review of Systems  Constitutional:  Negative for chills and fever.  HENT:  Positive for congestion and sinus pain. Negative for sore throat.   Respiratory:  Positive for cough. Negative for sputum production, shortness of breath and wheezing.   Gastrointestinal:  Negative for abdominal pain, constipation, diarrhea, nausea and vomiting.  Neurological:  Positive for headaches. Negative for  dizziness and weakness.       Objective:    Vitals BP (!) 140/80   Pulse 86   Temp 98.2 F (36.8 C)   Resp 19   Ht 5\' 2"  (1.575 m)   Wt 196 lb 6.4 oz (89.1 kg)   SpO2 96%   BMI 35.92 kg/m    Physical Examination Physical Exam Constitutional:      Appearance: Normal appearance. She is not ill-appearing.  HENT:     Right Ear: Tympanic membrane, ear canal and external ear normal.  Left Ear: Tympanic membrane, ear canal and external ear normal.     Nose: Nose normal. No congestion or rhinorrhea.     Mouth/Throat:     Mouth: Mucous membranes are moist.     Pharynx: Oropharynx is clear. No oropharyngeal exudate or posterior oropharyngeal erythema.  Cardiovascular:     Rate and Rhythm: Normal rate and regular rhythm.     Pulses: Normal pulses.     Heart sounds: No murmur heard.    No friction rub. No gallop.  Pulmonary:     Effort: Pulmonary effort is normal. No respiratory distress.     Breath sounds: No wheezing, rhonchi or rales.  Abdominal:     General: Bowel sounds are normal. There is no distension.     Palpations: Abdomen is soft.     Tenderness: There is no abdominal tenderness.  Musculoskeletal:     Right lower leg: No edema.     Left lower leg: No edema.  Skin:    General: Skin is warm and dry.     Findings: No rash.  Neurological:     Mental Status: She is alert.        Assessment & Plan:   Seasonal allergies The patient has seasonal allergies and she states her zyrtec, flonase and her singulair are not helping.  I asked her to start on a neti pot.  We will refer her to allergy/immunology.  Insomnia I want her to increase her amitriptyline from 50mg  to 100mg  nightly.  If this does not help in 2 weeks, I want her to call me.    No follow-ups on file.   Crist Fat, MD

## 2022-12-27 NOTE — Assessment & Plan Note (Signed)
I want her to increase her amitriptyline from 50mg  to 100mg  nightly.  If this does not help in 2 weeks, I want her to call me.

## 2022-12-29 ENCOUNTER — Other Ambulatory Visit: Payer: Self-pay | Admitting: Internal Medicine

## 2023-01-01 ENCOUNTER — Other Ambulatory Visit: Payer: Self-pay

## 2023-01-01 DIAGNOSIS — G4700 Insomnia, unspecified: Secondary | ICD-10-CM

## 2023-01-01 MED ORDER — CLONIDINE HCL 0.1 MG PO TABS
0.1000 mg | ORAL_TABLET | Freq: Every day | ORAL | 0 refills | Status: AC
Start: 2023-01-01 — End: ?

## 2023-01-01 NOTE — Progress Notes (Signed)
Rx Refill

## 2023-01-14 ENCOUNTER — Other Ambulatory Visit: Payer: Self-pay

## 2023-01-14 DIAGNOSIS — G4700 Insomnia, unspecified: Secondary | ICD-10-CM

## 2023-01-14 MED ORDER — AMITRIPTYLINE HCL 100 MG PO TABS
150.0000 mg | ORAL_TABLET | Freq: Every day | ORAL | 2 refills | Status: DC
Start: 2023-01-14 — End: 2023-02-03

## 2023-01-14 NOTE — Progress Notes (Signed)
New Rx

## 2023-01-22 ENCOUNTER — Other Ambulatory Visit: Payer: Self-pay

## 2023-01-22 DIAGNOSIS — R739 Hyperglycemia, unspecified: Secondary | ICD-10-CM

## 2023-01-22 DIAGNOSIS — I10 Essential (primary) hypertension: Secondary | ICD-10-CM

## 2023-01-22 MED ORDER — METOPROLOL SUCCINATE ER 25 MG PO TB24
25.0000 mg | ORAL_TABLET | Freq: Every day | ORAL | 1 refills | Status: DC
Start: 2023-01-22 — End: 2023-07-21

## 2023-01-22 NOTE — Progress Notes (Signed)
RX Refill

## 2023-01-24 ENCOUNTER — Other Ambulatory Visit: Payer: Self-pay | Admitting: Internal Medicine

## 2023-01-28 DIAGNOSIS — W06XXXA Fall from bed, initial encounter: Secondary | ICD-10-CM | POA: Diagnosis not present

## 2023-01-28 DIAGNOSIS — R109 Unspecified abdominal pain: Secondary | ICD-10-CM | POA: Diagnosis not present

## 2023-01-28 DIAGNOSIS — S32009A Unspecified fracture of unspecified lumbar vertebra, initial encounter for closed fracture: Secondary | ICD-10-CM | POA: Diagnosis not present

## 2023-01-28 DIAGNOSIS — S301XXA Contusion of abdominal wall, initial encounter: Secondary | ICD-10-CM | POA: Diagnosis not present

## 2023-01-28 DIAGNOSIS — M545 Low back pain, unspecified: Secondary | ICD-10-CM | POA: Diagnosis not present

## 2023-01-28 DIAGNOSIS — I700 Atherosclerosis of aorta: Secondary | ICD-10-CM | POA: Diagnosis not present

## 2023-01-28 DIAGNOSIS — S32019A Unspecified fracture of first lumbar vertebra, initial encounter for closed fracture: Secondary | ICD-10-CM | POA: Diagnosis not present

## 2023-01-28 DIAGNOSIS — M47816 Spondylosis without myelopathy or radiculopathy, lumbar region: Secondary | ICD-10-CM | POA: Diagnosis not present

## 2023-01-28 DIAGNOSIS — R102 Pelvic and perineal pain: Secondary | ICD-10-CM | POA: Diagnosis not present

## 2023-01-28 DIAGNOSIS — S2241XA Multiple fractures of ribs, right side, initial encounter for closed fracture: Secondary | ICD-10-CM | POA: Diagnosis not present

## 2023-02-03 ENCOUNTER — Ambulatory Visit: Payer: Medicare HMO | Admitting: Internal Medicine

## 2023-02-03 ENCOUNTER — Encounter: Payer: Self-pay | Admitting: Internal Medicine

## 2023-02-03 VITALS — BP 116/78 | HR 81 | Temp 98.0°F | Resp 18 | Ht 62.0 in | Wt 207.0 lb

## 2023-02-03 DIAGNOSIS — S32018D Other fracture of first lumbar vertebra, subsequent encounter for fracture with routine healing: Secondary | ICD-10-CM

## 2023-02-03 DIAGNOSIS — G4709 Other insomnia: Secondary | ICD-10-CM | POA: Diagnosis not present

## 2023-02-03 DIAGNOSIS — S2241XA Multiple fractures of ribs, right side, initial encounter for closed fracture: Secondary | ICD-10-CM | POA: Insufficient documentation

## 2023-02-03 DIAGNOSIS — S32019D Unspecified fracture of first lumbar vertebra, subsequent encounter for fracture with routine healing: Secondary | ICD-10-CM | POA: Insufficient documentation

## 2023-02-03 DIAGNOSIS — S2241XD Multiple fractures of ribs, right side, subsequent encounter for fracture with routine healing: Secondary | ICD-10-CM | POA: Diagnosis not present

## 2023-02-03 MED ORDER — PREGABALIN 75 MG PO CAPS: 75.0000 mg | ORAL_CAPSULE | Freq: Two times a day (BID) | ORAL | 2 refills | Status: DC

## 2023-02-03 MED ORDER — QUETIAPINE FUMARATE 25 MG PO TABS: 25.0000 mg | ORAL_TABLET | Freq: Every day | ORAL | 3 refills | Status: DC

## 2023-02-03 MED ORDER — CELECOXIB 200 MG PO CAPS: 200.0000 mg | ORAL_CAPSULE | Freq: Every day | ORAL | 0 refills | Status: DC

## 2023-02-03 MED ORDER — LIDOCAINE 5 % EX PTCH: 1.0000 | MEDICATED_PATCH | Freq: Two times a day (BID) | CUTANEOUS | 0 refills | Status: AC

## 2023-02-03 NOTE — Assessment & Plan Note (Signed)
She has right sided L 1 transverse process process fracture.  She will apply another Lidoderm patch daily at tender point and take Celebrex.

## 2023-02-03 NOTE — Assessment & Plan Note (Signed)
I have discussed with her that I will give her Seroquel 25 mg at nighttime that will help as adjunct therapy for depression and insomnia.  She will stop amitriptyline.

## 2023-02-03 NOTE — Assessment & Plan Note (Signed)
Right sided ninth and 10th rib fracture.  She will apply Lidoderm patch there and take Celebrex 200 mg daily for 2 weeks.  She will follow-up in 2 weeks time.

## 2023-02-03 NOTE — Progress Notes (Signed)
Office Visit  Subjective   [name]   DOB: [birth year]   Age: 77 y.o.   MRN: [id number]   Chief Complaint Chief Complaint  Patient presents with   Follow-up    ER follow up.     History of Present Illness 77 years old female who is here for follow up from ED. she fell from her bed to the floor.  She started hurting on the right suggesting back.  She went to the emergency room and x-ray was done that shows right-sided ninth and 10th rib fracture and L1 right transverse process fracture.  She was given oxycodone and she was advised to follow-up with [place].  She says that she was started hurting more and more and now she cannot rest because of the pain.  Right sided chest pain get worse when she takes deep breath in and lower right-sided back pain get worse when she moves.  I have reviewed the x-ray that she brought the report with me.  She denies any shortness of breath. She also has anxiety and insomnia.  She is getting sertraline 50 mg and amitriptyline 150 mg but she still toss and turn at nighttime.  She says that her mouth got dry.  She wanted to stop amitriptyline.  She has an appointment for follow-up with Dr. Milly Jakob in 2 weeks time.  Past Medical History Past Medical History:  Diagnosis Date   Acute postoperative respiratory failure (HCC) 03/25/2014   Anesthesia of skin    Anxiety    Aortic atherosclerosis (HCC)    Arthritis    Back pain    Benign essential HTN 03/25/2014   Cancer (HCC)    endometrial cancer   Chronic daily headache 10/23/2017   Chronic low back pain 07/30/2017   Chronic pain syndrome 02/23/2014   DDD (degenerative disc disease), lumbar    Depression    Endometrial cancer (HCC) 01/10/2016   Fibromyalgia 09/08/2012   Gastroparesis    Genetic testing 12/17/2017   TumorNext [facility] + CancerNext was ordered. The CancerNext gene panel offered by [employer] includes sequencing and rearrangement analysis for the following 32 genes:   APC, ATM,  BARD1, BMPR1A, BRCA1, BRCA2, BRIP1, CDH1, CDK4, CDKN2A, CHEK2, DICER1, EPCAM, GREM1, HOXB13MLH1, MRE11A, MSH2, MSH6, MUTYH, NBN, NF1, PALB2, PMS2, POLD1, POLE, PTEN, RAD50, RAD51D, SMAD4, SMARCA4, STK11, and TP53.   Ger   History of kidney stones    Hyperlipidemia 03/25/2014   Insomnia 09/08/2012   Long-term current use of opiate analgesic 05/28/2017   Lumbar post-laminectomy syndrome 07/30/2017   Near syncope 07/10/2017   NEPHROLITHIASIS 06/26/2006   Qualifier: Diagnosis of  By: Bebe Shaggy     Obese 11/11/2018   Obesity (BMI 30-39.9) 09/02/2012   Obstructive uropathy 03/25/2014   Osteoporosis    Prolactinoma (HCC) 07/16/2017   S/P left TKA 11/10/2018   S/P right TKA 08/31/2012   UTI (urinary tract infection)      Allergies Allergies  Allergen Reactions   Amoxicillin-Pot Clavulanate Nausea Only    Other reaction(s): stomach upset   Bupropion Nausea Only    Other reaction(s): stomach upset     Review of Systems Review of Systems  Constitutional: Negative.   Respiratory: Negative.    Cardiovascular:  Positive for chest pain.  Musculoskeletal:  Positive for back pain.  Psychiatric/Behavioral:  The patient is nervous/anxious and has insomnia.        Objective:    Vitals BP 116/78 (BP Location: Left Arm, Patient Position: Sitting,  Cuff Size: Normal)   Pulse 81   Temp 98 F (36.7 C)   Resp 18   Ht 5\' 2"  (1.575 m)   Wt 207 lb (93.9 kg)   SpO2 91%   BMI 37.86 kg/m    Physical Examination Physical Exam Constitutional:      Appearance: Normal appearance. She is obese.  Cardiovascular:     Rate and Rhythm: Normal rate and regular rhythm.     Heart sounds: Normal heart sounds.  Pulmonary:     Effort: Pulmonary effort is normal.     Breath sounds: Normal breath sounds.  Chest:     Chest wall: Tenderness present.  Musculoskeletal:        General: Tenderness present.  Neurological:     General: No focal deficit present.     Mental Status: She is alert and  oriented to person, place, and time.        Assessment & Plan:   Multiple closed fractures of ribs of right side Right sided ninth and 10th rib fracture.  She will apply Lidoderm patch there and take Celebrex 200 mg daily for 2 weeks.  She will follow-up in 2 weeks time.  Closed fracture of first lumbar vertebra with routine healing She has right sided L 1 transverse process process fracture.  She will apply another Lidoderm patch daily at tender point and take Celebrex.  Insomnia I have discussed with her that I will give her Seroquel 25 mg at nighttime that will help as adjunct therapy for depression and insomnia.  She will stop amitriptyline.   Side effects were discussed with her and she already has an appointment in 2 weeks.  No follow-ups on file.   Eloisa Northern, MD

## 2023-02-10 ENCOUNTER — Encounter: Payer: Self-pay | Admitting: Internal Medicine

## 2023-02-10 ENCOUNTER — Ambulatory Visit: Payer: Medicare HMO | Admitting: Internal Medicine

## 2023-02-10 VITALS — BP 130/78 | HR 89 | Temp 97.9°F | Resp 18 | Ht 62.0 in | Wt 206.0 lb

## 2023-02-10 DIAGNOSIS — G4700 Insomnia, unspecified: Secondary | ICD-10-CM

## 2023-02-10 DIAGNOSIS — T148XXA Other injury of unspecified body region, initial encounter: Secondary | ICD-10-CM

## 2023-02-10 DIAGNOSIS — F411 Generalized anxiety disorder: Secondary | ICD-10-CM | POA: Diagnosis not present

## 2023-02-10 MED ORDER — CEPHALEXIN 500 MG PO CAPS: 500.0000 mg | ORAL_CAPSULE | Freq: Two times a day (BID) | ORAL | 0 refills | Status: AC

## 2023-02-10 MED ORDER — DICLOFENAC SODIUM 50 MG PO TBEC: 50.0000 mg | DELAYED_RELEASE_TABLET | Freq: Two times a day (BID) | ORAL | 0 refills | Status: AC

## 2023-02-10 NOTE — Assessment & Plan Note (Addendum)
She has a large right hematoma of her right flank.  We will put her on some antiinflammatories with diclofenac and give her an antibiotic just in case she has a developing cellulitis.  I want her to use a hot water bottle on this area.  She understands that this could evolve into some skin break down and if this starts occurring, she is to be seen again.  This is a hematoma and we will let her body let this naturally evolve thru it.  She states she has taken keflex in the past without any problems.

## 2023-02-10 NOTE — Assessment & Plan Note (Signed)
Plan as below.

## 2023-02-10 NOTE — Assessment & Plan Note (Signed)
She is now off amitriptyline and on seroquel for some anxiety and her insomnnia.  She states this is working well.

## 2023-02-10 NOTE — Progress Notes (Signed)
Office Visit  Subjective   [name]   DOB: July 22, 1945   Age: 77 y.o.   MRN: [id number]   Chief Complaint Chief Complaint  Patient presents with   Follow-up    Swelling/bruising  on right lower back     History of Present Illness The patient also had a fall 2 weeks ago where she fell from her bed to the floor.  She went to [facility] ER and x-rays showed a right-sided ninth rib and 10th rib fractures and L1 right transverse process fracture.  They also did a CT scan of her abd/pelvis with contrast that showed the above fractures with a mixed density collection in the subcutaneous fat over the right flank region consistent with soft tissue hematoma.  There was non-obstructing kidney stones and aortic atherosclerosis.  The ER gave her oxycodone and she was advised to follow-up with [place].  She saw my partner on 10/7/ who gave her lidoderm patches but she was not able to put these on.  She is also on an ASA 81mg  daily but no blood thinnners.  She also saw Dr. Nelson Chimes about 2 weeks where he felt she had some increased anxiety.  She was on sertraline 50mg  daily and I had a few months ago adjust her amitriptyline from 50mg  and uptitrated to 150mg  nightly.  She states this increase made her more hungry and did not help with her sleep.  Dr. Nelson Chimes stopped the amitriptyline and placed her on seroquel 25mg  at night time to help with anxiety and insomnia.  Over the interim, she states this has helped.  The patient denies any depression at this time.  She does have a history of  insomnia.  She states that over the years it has been hard for her to get to sleep.  She has tried melatonin.  She states she was placed on 3 different medicines (not ambien) where none of them worked.  Her previous NP placed her on clonazepam where she states it is not helping.  She is no longer on clonazepam. She states she has tried elavil in the past but they never went up on the dose.       Past Medical History Past Medical  History:  Diagnosis Date   Acute postoperative respiratory failure (HCC) 03/25/2014   Anesthesia of skin    Anxiety    Aortic atherosclerosis (HCC)    Arthritis    Back pain    Benign essential HTN 03/25/2014   Cancer (HCC)    endometrial cancer   Chronic daily headache 10/23/2017   Chronic low back pain 07/30/2017   Chronic pain syndrome 02/23/2014   DDD (degenerative disc disease), lumbar    Depression    Endometrial cancer (HCC) 01/10/2016   Fibromyalgia 09/08/2012   Gastroparesis    Genetic testing 12/17/2017   TumorNext [facility] + CancerNext was ordered. The CancerNext gene panel offered by [employer] includes sequencing and rearrangement analysis for the following 32 genes:   APC, ATM, BARD1, BMPR1A, BRCA1, BRCA2, BRIP1, CDH1, CDK4, CDKN2A, CHEK2, DICER1, EPCAM, GREM1, HOXB13MLH1, MRE11A, MSH2, MSH6, MUTYH, NBN, NF1, PALB2, PMS2, POLD1, POLE, PTEN, RAD50, RAD51D, SMAD4, SMARCA4, STK11, and TP53.   Ger   History of kidney stones    Hyperlipidemia 03/25/2014   Insomnia 09/08/2012   Long-term current use of opiate analgesic 05/28/2017   Lumbar post-laminectomy syndrome 07/30/2017   Near syncope 07/10/2017   NEPHROLITHIASIS 06/26/2006   Qualifier: Diagnosis of  By: Bebe Shaggy  Obese 11/11/2018   Obesity (BMI 30-39.9) 09/02/2012   Obstructive uropathy 03/25/2014   Osteoporosis    Prolactinoma (HCC) 07/16/2017   S/P left TKA 11/10/2018   S/P right TKA 08/31/2012   UTI (urinary tract infection)      Allergies Allergies  Allergen Reactions   Amoxicillin-Pot Clavulanate Nausea Only    Other reaction(s): stomach upset   Bupropion Nausea Only    Other reaction(s): stomach upset     Medications  Current Outpatient Medications:    acetaminophen (TYLENOL) 325 MG tablet, Take 650 mg by mouth every 6 (six) hours as needed., Disp: , Rfl:    aspirin 81 MG EC tablet, Chew 1 tablet by mouth daily., Disp: , Rfl:    AZO D-MANNOSE PO, Take by mouth., Disp: , Rfl:     cabergoline (DOSTINEX) 0.5 MG tablet, Take 0.25 mg by mouth 2 (two) times a week., Disp: , Rfl:    calcium citrate (CALCITRATE - DOSED IN MG ELEMENTAL CALCIUM) 950 (200 Ca) MG tablet, Take 630 mg of elemental calcium by mouth daily., Disp: , Rfl:    celecoxib (CELEBREX) 200 MG capsule, Take 1 capsule (200 mg total) by mouth daily., Disp: 30 capsule, Rfl: 0   cloNIDine (CATAPRES) 0.1 MG tablet, Take 1 tablet (0.1 mg total) by mouth at bedtime., Disp: 60 tablet, Rfl: 0   conjugated estrogens (PREMARIN) vaginal cream, Place 1 applicator vaginally daily., Disp: , Rfl:    docusate sodium (COLACE) 50 MG capsule, Take 50 mg by mouth at bedtime., Disp: , Rfl:    fluticasone (FLONASE) 50 MCG/ACT nasal spray, Place 1 spray into both nostrils daily as needed for allergies., Disp: 1 g, Rfl: 1   levothyroxine (SYNTHROID) 25 MCG tablet, Take 25 mcg by mouth daily before breakfast. , Disp: , Rfl:    lidocaine (LIDODERM) 5 %, Place 1 patch onto the skin every 12 (twelve) hours for 10 days. Remove & Discard patch within 12 hours or as directed by MD, Disp: 20 patch, Rfl: 0   losartan (COZAAR) 50 MG tablet, Take 1 tablet (50 mg total) by mouth daily., Disp: 90 tablet, Rfl: 1   Magnesium 400 MG TABS, Take 400 mg by mouth daily., Disp: , Rfl:    methocarbamol (ROBAXIN) 500 MG tablet, Take 1 tablet (500 mg total) by mouth 4 (four) times daily., Disp: 360 tablet, Rfl: 1   metoprolol succinate (TOPROL-XL) 25 MG 24 hr tablet, Take 1 tablet (25 mg total) by mouth daily., Disp: 90 tablet, Rfl: 1   montelukast (SINGULAIR) 10 MG tablet, Take 10 mg by mouth at bedtime. , Disp: , Rfl: 1   Multiple Vitamins-Minerals (HAIR SKIN & NAILS ADVANCED PO), Take by mouth., Disp: , Rfl:    Omega-3 Fatty Acids (FISH OIL) 1000 MG CAPS, Take 1 capsule by mouth in the morning and at bedtime. , Disp: , Rfl:    ondansetron (ZOFRAN) 4 MG tablet, Take 1 tablet (4 mg total) by mouth every 8 (eight) hours as needed for nausea or vomiting., Disp: 30  tablet, Rfl: 0   pantoprazole (PROTONIX) 40 MG tablet, Take 40 mg by mouth daily., Disp: , Rfl:    polyethylene glycol (MIRALAX / GLYCOLAX) 17 g packet, Take 17 g by mouth 2 (two) times daily., Disp: 28 packet, Rfl: 0   pravastatin (PRAVACHOL) 40 MG tablet, TAKE 1 TABLET BY MOUTH EVERY DAY, Disp: 90 tablet, Rfl: 1   pregabalin (LYRICA) 75 MG capsule, Take 1 capsule (75 mg total) by mouth 2 (two)  times daily., Disp: 60 capsule, Rfl: 2   QUEtiapine (SEROQUEL) 25 MG tablet, Take 1 tablet (25 mg total) by mouth at bedtime., Disp: 30 tablet, Rfl: 3   sertraline (ZOLOFT) 50 MG tablet, Take 1 tablet (50 mg total) by mouth daily., Disp: 90 tablet, Rfl: 3   Review of Systems Review of Systems  Constitutional:  Negative for chills and fever.  Eyes:  Negative for blurred vision and double vision.  Respiratory:  Negative for cough and shortness of breath.   Cardiovascular:  Negative for chest pain and palpitations.  Gastrointestinal:  Negative for abdominal pain, constipation, diarrhea, nausea and vomiting.  Musculoskeletal:  Positive for back pain. Negative for falls and myalgias.  Neurological:  Negative for dizziness, weakness and headaches.       Objective:    Vitals BP 130/78   Pulse 89   Temp 97.9 F (36.6 C)   Resp 18   Ht 5\' 2"  (1.575 m)   Wt 206 lb (93.4 kg)   SpO2 95%   BMI 37.68 kg/m    Physical Examination Physical Exam Constitutional:      Appearance: Normal appearance. She is not ill-appearing.  Cardiovascular:     Rate and Rhythm: Normal rate and regular rhythm.     Pulses: Normal pulses.     Heart sounds: No murmur heard.    No friction rub. No gallop.  Pulmonary:     Effort: Pulmonary effort is normal. No respiratory distress.     Breath sounds: No wheezing, rhonchi or rales.  Abdominal:     General: Bowel sounds are normal. There is no distension.     Palpations: Abdomen is soft.     Tenderness: There is no abdominal tenderness.  Musculoskeletal:     Right  lower leg: No edema.     Left lower leg: No edema.  Skin:    General: Skin is warm and dry.     Findings: No rash.     Comments: She has a hemtoma that is warm to touch over her right flank.  There is no erythema or skin breakdown.  Neurological:     Mental Status: She is alert.        Assessment & Plan:   Hematoma She has a large right hematoma of her right flank.  We will put her on some antiinflammatories with diclofenac and give her an antibiotic just in case she has a developing cellulitis.  I want her to use a hot water bottle on this area.  She understands that this could evolve into some skin break down and if this starts occurring, she is to be seen again.  This is a hematoma and we will let her body let this naturally evolve thru it.  She states she has taken keflex in the past without any problems.  Insomnia She is now off amitriptyline and on seroquel for some anxiety and her insomnnia.  She states this is working well.  GAD (generalized anxiety disorder) Plan as below.    Return in about 3 months (around 05/13/2023).   Crist Fat, MD

## 2023-02-12 DIAGNOSIS — D352 Benign neoplasm of pituitary gland: Secondary | ICD-10-CM | POA: Diagnosis not present

## 2023-02-17 ENCOUNTER — Ambulatory Visit: Payer: Medicare HMO | Admitting: Internal Medicine

## 2023-02-17 VITALS — BP 130/80 | HR 78 | Temp 97.6°F | Resp 18 | Ht 62.0 in | Wt 206.0 lb

## 2023-02-17 DIAGNOSIS — R1010 Upper abdominal pain, unspecified: Secondary | ICD-10-CM | POA: Insufficient documentation

## 2023-02-17 NOTE — Progress Notes (Signed)
Office Visit  Subjective   [name]   DOB: 04/12/1946   Age: 77 y.o.   MRN: [id number]   Chief Complaint Chief Complaint  Patient presents with   office visit    Hematoma getting worse than last week      History of Present Illness Mrs. Stryker is a 77 yo female who returns today with continued pain and swelling from her right flank hematoma.  I saw her 1 week ago and felt that she could just put heat on this and take tylenol for pain.  She states it has not become more enlarged but her pain has not improved.  Again, she had a fall 3 weeks ago where she fell from her bed to the floor.  She went to [facility] ER and x-rays showed a right-sided ninth rib and 10th rib fractures and L1 right transverse process fracture.  They also did a CT scan of her abd/pelvis with contrast that showed the above fractures with a mixed density collection in the subcutaneous fat over the right flank region consistent with soft tissue hematoma.  There was non-obstructing kidney stones and aortic atherosclerosis.  The ER gave her oxycodone and she was advised to follow-up with [place].  She saw my partner on 10/7/ who gave her lidoderm patches but she was not able to put these on.  She is also on an ASA 81mg  daily but no blood thinnners.     Past Medical History Past Medical History:  Diagnosis Date   Acute postoperative respiratory failure (HCC) 03/25/2014   Anesthesia of skin    Anxiety    Aortic atherosclerosis (HCC)    Arthritis    Back pain    Benign essential HTN 03/25/2014   Cancer (HCC)    endometrial cancer   Chronic daily headache 10/23/2017   Chronic low back pain 07/30/2017   Chronic pain syndrome 02/23/2014   DDD (degenerative disc disease), lumbar    Depression    Endometrial cancer (HCC) 01/10/2016   Fibromyalgia 09/08/2012   Gastroparesis    Genetic testing 12/17/2017   TumorNext [facility] + CancerNext was ordered. The CancerNext gene panel offered by [employer] includes sequencing  and rearrangement analysis for the following 32 genes:   APC, ATM, BARD1, BMPR1A, BRCA1, BRCA2, BRIP1, CDH1, CDK4, CDKN2A, CHEK2, DICER1, EPCAM, GREM1, HOXB13MLH1, MRE11A, MSH2, MSH6, MUTYH, NBN, NF1, PALB2, PMS2, POLD1, POLE, PTEN, RAD50, RAD51D, SMAD4, SMARCA4, STK11, and TP53.   Ger   History of kidney stones    Hyperlipidemia 03/25/2014   Insomnia 09/08/2012   Long-term current use of opiate analgesic 05/28/2017   Lumbar post-laminectomy syndrome 07/30/2017   Near syncope 07/10/2017   NEPHROLITHIASIS 06/26/2006   Qualifier: Diagnosis of  By: Bebe Shaggy     Obese 11/11/2018   Obesity (BMI 30-39.9) 09/02/2012   Obstructive uropathy 03/25/2014   Osteoporosis    Prolactinoma (HCC) 07/16/2017   S/P left TKA 11/10/2018   S/P right TKA 08/31/2012   UTI (urinary tract infection)      Allergies Allergies  Allergen Reactions   Amoxicillin-Pot Clavulanate Nausea Only    Other reaction(s): stomach upset   Bupropion Nausea Only    Other reaction(s): stomach upset     Medications  Current Outpatient Medications:    acetaminophen (TYLENOL) 325 MG tablet, Take 650 mg by mouth every 6 (six) hours as needed., Disp: , Rfl:    aspirin 81 MG EC tablet, Chew 1 tablet by mouth daily., Disp: , Rfl:  AZO D-MANNOSE PO, Take by mouth., Disp: , Rfl:    cabergoline (DOSTINEX) 0.5 MG tablet, Take 0.25 mg by mouth 2 (two) times a week., Disp: , Rfl:    calcium citrate (CALCITRATE - DOSED IN MG ELEMENTAL CALCIUM) 950 (200 Ca) MG tablet, Take 630 mg of elemental calcium by mouth daily., Disp: , Rfl:    celecoxib (CELEBREX) 200 MG capsule, Take 1 capsule (200 mg total) by mouth daily., Disp: 30 capsule, Rfl: 0   cephALEXin (KEFLEX) 500 MG capsule, Take 1 capsule (500 mg total) by mouth 2 (two) times daily for 7 days., Disp: 14 capsule, Rfl: 0   cloNIDine (CATAPRES) 0.1 MG tablet, Take 1 tablet (0.1 mg total) by mouth at bedtime., Disp: 60 tablet, Rfl: 0   conjugated estrogens (PREMARIN) vaginal  cream, Place 1 applicator vaginally daily., Disp: , Rfl:    diclofenac (VOLTAREN) 50 MG EC tablet, Take 1 tablet (50 mg total) by mouth 2 (two) times daily for 7 days., Disp: 14 tablet, Rfl: 0   docusate sodium (COLACE) 50 MG capsule, Take 50 mg by mouth at bedtime., Disp: , Rfl:    fluticasone (FLONASE) 50 MCG/ACT nasal spray, Place 1 spray into both nostrils daily as needed for allergies., Disp: 1 g, Rfl: 1   levothyroxine (SYNTHROID) 25 MCG tablet, Take 25 mcg by mouth daily before breakfast. , Disp: , Rfl:    losartan (COZAAR) 50 MG tablet, Take 1 tablet (50 mg total) by mouth daily., Disp: 90 tablet, Rfl: 1   Magnesium 400 MG TABS, Take 400 mg by mouth daily., Disp: , Rfl:    methocarbamol (ROBAXIN) 500 MG tablet, Take 1 tablet (500 mg total) by mouth 4 (four) times daily., Disp: 360 tablet, Rfl: 1   metoprolol succinate (TOPROL-XL) 25 MG 24 hr tablet, Take 1 tablet (25 mg total) by mouth daily., Disp: 90 tablet, Rfl: 1   montelukast (SINGULAIR) 10 MG tablet, Take 10 mg by mouth at bedtime. , Disp: , Rfl: 1   Multiple Vitamins-Minerals (HAIR SKIN & NAILS ADVANCED PO), Take by mouth., Disp: , Rfl:    Omega-3 Fatty Acids (FISH OIL) 1000 MG CAPS, Take 1 capsule by mouth in the morning and at bedtime. , Disp: , Rfl:    ondansetron (ZOFRAN) 4 MG tablet, Take 1 tablet (4 mg total) by mouth every 8 (eight) hours as needed for nausea or vomiting., Disp: 30 tablet, Rfl: 0   pantoprazole (PROTONIX) 40 MG tablet, Take 40 mg by mouth daily., Disp: , Rfl:    polyethylene glycol (MIRALAX / GLYCOLAX) 17 g packet, Take 17 g by mouth 2 (two) times daily., Disp: 28 packet, Rfl: 0   pravastatin (PRAVACHOL) 40 MG tablet, TAKE 1 TABLET BY MOUTH EVERY DAY, Disp: 90 tablet, Rfl: 1   pregabalin (LYRICA) 75 MG capsule, Take 1 capsule (75 mg total) by mouth 2 (two) times daily., Disp: 60 capsule, Rfl: 2   QUEtiapine (SEROQUEL) 25 MG tablet, Take 1 tablet (25 mg total) by mouth at bedtime., Disp: 30 tablet, Rfl: 3    sertraline (ZOLOFT) 50 MG tablet, Take 1 tablet (50 mg total) by mouth daily., Disp: 90 tablet, Rfl: 3   Review of Systems Review of Systems  Constitutional:  Negative for chills and fever.  Respiratory:  Negative for cough and shortness of breath.   Cardiovascular:  Negative for chest pain, palpitations and leg swelling.  Gastrointestinal:  Positive for constipation and nausea. Negative for abdominal pain, diarrhea and vomiting.  Musculoskeletal:  Positive  for back pain.       Objective:    Vitals BP 130/80 (BP Location: Left Arm, Patient Position: Sitting, Cuff Size: Normal)   Pulse 78   Temp 97.6 F (36.4 C)   Resp 18   Ht 5\' 2"  (1.575 m)   Wt 206 lb (93.4 kg)   SpO2 98%   BMI 37.68 kg/m    Physical Examination Physical Exam Constitutional:      Appearance: Normal appearance. She is not ill-appearing.  Cardiovascular:     Rate and Rhythm: Normal rate and regular rhythm.     Pulses: Normal pulses.     Heart sounds: No murmur heard.    No friction rub. No gallop.  Pulmonary:     Effort: Pulmonary effort is normal. No respiratory distress.     Breath sounds: No wheezing, rhonchi or rales.  Abdominal:     General: Bowel sounds are normal. There is no distension.     Palpations: Abdomen is soft.     Tenderness: There is no abdominal tenderness.  Musculoskeletal:     Right lower leg: No edema.     Left lower leg: No edema.  Skin:    General: Skin is warm and dry.     Findings: No rash.     Comments: She has a large hematoma fluid collection of her right flank that is warm but there is no erythema.  There is no change in the size since her last visit.  Neurological:     Mental Status: She is alert.        Assessment & Plan:   Pain of upper abdomen She has right flank/abdominal hematoma that has not improved.  She states the pain has worsened.  I am going to obtain a CT scan of her abd/pelvis at this time.    No follow-ups on file.   Crist Fat, MD

## 2023-02-17 NOTE — Assessment & Plan Note (Signed)
She has right flank/abdominal hematoma that has not improved.  She states the pain has worsened.  I am going to obtain a CT scan of her abd/pelvis at this time.

## 2023-02-18 ENCOUNTER — Ambulatory Visit: Payer: Medicare HMO | Admitting: Allergy

## 2023-02-18 ENCOUNTER — Encounter: Payer: Self-pay | Admitting: Allergy

## 2023-02-18 VITALS — BP 136/82 | HR 69 | Resp 16 | Ht 62.0 in | Wt 203.4 lb

## 2023-02-18 DIAGNOSIS — R0982 Postnasal drip: Secondary | ICD-10-CM

## 2023-02-18 DIAGNOSIS — J310 Chronic rhinitis: Secondary | ICD-10-CM | POA: Diagnosis not present

## 2023-02-18 MED ORDER — FEXOFENADINE HCL 180 MG PO TABS: 180.0000 mg | ORAL_TABLET | Freq: Every day | ORAL | 5 refills | Status: DC | PRN

## 2023-02-18 MED ORDER — AZELASTINE HCL 0.1 % NA SOLN: 2.0000 | Freq: Two times a day (BID) | NASAL | 5 refills | Status: DC | PRN

## 2023-02-18 NOTE — Progress Notes (Signed)
[facility]   New Patient Note  RE: Kimberly Larsen MRN: [id number] DOB: [birth year] Date of Office Visit: 02/18/2023  Primary care provider: Crist Fat, MD  Chief Complaint: Seasonal allergies  History of present illness: Kimberly Larsen is a 77 y.o. female presenting today for evaluation of allergic rhinitis.  Discussed the use of AI scribe software for clinical note transcription with the patient, who gave verbal consent to proceed.  She reports a longstanding history of postnasal drip and throat clearing, suspected to be due to environmental or seasonal allergies.  She report occasional episodes of sneezing, stuffiness, and easy fatigability.  She also mention experiencing headaches for the past week and a half, localized to the back of the head and across the forehead, with tenderness in the temples.  The patient has been on Flonase for several years, using two sprays daily, and Singulair for approximately five years, taken nightly.  She also tried [employer] in the spring of this year, which seemed to help. However, she express a desire to reduce their medication load if possible.  In addition to these symptoms, the patient reports a significant fall about a month ago, resulting in two fractured ribs and a hematoma.  She have been experiencing discomfort and swelling, particularly in the back, despite using ice and heat therapy.  She were prescribed an antibiotic and an anti-inflammatory for a week, but report no improvement.  States that she will be having more imaging studies of her back this week.  The patient denies any history of asthma or sinus infections.  She has never undergone allergy testing or used a saline rinse or neti pot.      Review of systems: 10pt ROS negative unless noted above in HPI  All other systems negative unless noted above in HPI  Past medical history: Past Medical History:  Diagnosis Date   Acute postoperative respiratory failure (HCC) 03/25/2014   Anesthesia of  skin    Anxiety    Aortic atherosclerosis (HCC)    Arthritis    Back pain    Benign essential HTN 03/25/2014   Cancer (HCC)    endometrial cancer   Chronic daily headache 10/23/2017   Chronic low back pain 07/30/2017   Chronic pain syndrome 02/23/2014   DDD (degenerative disc disease), lumbar    Depression    Endometrial cancer (HCC) 01/10/2016   Fibromyalgia 09/08/2012   Gastroparesis    Genetic testing 12/17/2017   TumorNext [facility] + CancerNext was ordered. The CancerNext gene panel offered by [employer] includes sequencing and rearrangement analysis for the following 32 genes:   APC, ATM, BARD1, BMPR1A, BRCA1, BRCA2, BRIP1, CDH1, CDK4, CDKN2A, CHEK2, DICER1, EPCAM, GREM1, HOXB13MLH1, MRE11A, MSH2, MSH6, MUTYH, NBN, NF1, PALB2, PMS2, POLD1, POLE, PTEN, RAD50, RAD51D, SMAD4, SMARCA4, STK11, and TP53.   Ger   History of kidney stones    Hyperlipidemia 03/25/2014   Insomnia 09/08/2012   Long-term current use of opiate analgesic 05/28/2017   Lumbar post-laminectomy syndrome 07/30/2017   Near syncope 07/10/2017   NEPHROLITHIASIS 06/26/2006   Qualifier: Diagnosis of  By: Bebe Shaggy     Obese 11/11/2018   Obesity (BMI 30-39.9) 09/02/2012   Obstructive uropathy 03/25/2014   Osteoporosis    Prolactinoma (HCC) 07/16/2017   S/P left TKA 11/10/2018   S/P right TKA 08/31/2012   UTI (urinary tract infection)     Past surgical history: Past Surgical History:  Procedure Laterality Date   BACK SURGERY  2013   CARPAL TUNNEL RELEASE  R hand   CYSTOSCOPY WITH URETEROSCOPY AND STENT PLACEMENT Bilateral 03/25/2014   Procedure: CYSTOSCOPY WITH URETEROSCOPY AND STENT PLACEMENT;  Surgeon: Heloise Purpura, MD;  Location: [facility];  Service: Urology;  Laterality: Bilateral;   CYSTOSCOPY WITH URETEROSCOPY AND STENT PLACEMENT Bilateral 04/18/2014   Procedure: CYSTOSCOPY WITH URETEROSCOPY AND STENT PLACEMENT,RETROGRADE;  Surgeon: Heloise Purpura, MD;  Location: [facility];  Service: Urology;   Laterality: Bilateral;   CYSTOSCOPY WITH URETEROSCOPY AND STENT PLACEMENT Right 05/30/2014   Procedure: CYSTOSCOPY WITH URETEROSCOPY AND STENT PLACEMENT;  Surgeon: Heloise Purpura, MD;  Location: [facility];  Service: Urology;  Laterality: Right;   CYSTOSCOPY/RETROGRADE/URETEROSCOPY Left 05/30/2014   Procedure: LEFT RETROGRADE;  Surgeon: Heloise Purpura, MD;  Location: [facility];  Service: Urology;  Laterality: Left;   CYSTOSCOPY/URETEROSCOPY/HOLMIUM LASER/STENT PLACEMENT Left 11/25/2019   Procedure: CYSTOSCOPY/RETROGRADE/URETEROSCOPY/HOLMIUM LASER/STENT PLACEMENT;  Surgeon: Heloise Purpura, MD;  Location: [facility];  Service: Urology;  Laterality: Left;   EYE SURGERY     cataract surgery bil   GANGLION CYST EXCISION     L ankle   HAMMERTOE RECONSTRUCTION WITH WEIL OSTEOTOMY Left 09/05/2016   Procedure: Second Metatarsal Weil Osteotomy and Hammertoe Correction;  Surgeon: Toni Arthurs, MD;  Location: [facility];  Service: Orthopedics;  Laterality: Left;   HOLMIUM LASER APPLICATION Bilateral 04/18/2014   Procedure: HOLMIUM LASER APPLICATION;  Surgeon: Heloise Purpura, MD;  Location: [facility];  Service: Urology;  Laterality: Bilateral;   JOINT REPLACEMENT     total knee   LITHOTRIPSY     x 3   LUMBAR FUSION  09/2011   METATARSAL OSTEOTOMY WITH BUNIONECTOMY Left 09/05/2016   Procedure: Left First Metatarsal Scarf Osteotomy, Modified McBride Bunion Correction;  Surgeon: Toni Arthurs, MD;  Location: [facility];  Service: Orthopedics;  Laterality: Left;   RHINOPLASTY     ROBOTIC ASSISTED TOTAL HYSTERECTOMY WITH BILATERAL SALPINGO OOPHERECTOMY Bilateral 01/16/2016   Procedure: XI ROBOTIC ASSISTED TOTAL HYSTERECTOMY WITH BILATERAL SALPINGO OOPHORECTOMY AND BILATERAL PELVIC LYMPH NODE DISSECTION;  Surgeon: Adolphus Birchwood, MD;  Location: [facility];  Service: Gynecology;  Laterality: Bilateral;   SPINAL CORD STIMULATOR INSERTION N/A 02/23/2014   Procedure: SPINAL CORD STIMULATOR PLACEMENT ;  Surgeon: Venita Lick, MD;  Location: [facility];  Service: Orthopedics;  Laterality: N/A;   TONSILLECTOMY     TOTAL KNEE ARTHROPLASTY Right 08/31/2012   Procedure: RIGHT TOTAL KNEE ARTHROPLASTY;  Surgeon: [name], MD;  Location: [facility];  Service: Orthopedics;  Laterality: Right;  with LMA   TOTAL KNEE ARTHROPLASTY Left 11/10/2018   Procedure: TOTAL KNEE ARTHROPLASTY;  Surgeon: Durene Romans, MD;  Location: [facility];  Service: Orthopedics;  Laterality: Left;  70 mins   TUBAL LIGATION      Family history:  Family History  Problem Relation Age of Onset   Stroke Mother    Emphysema Mother    COPD Mother    Hypertension Mother    Stroke Father    Heart disease Father    Emphysema Father    Hypertension Father    Heart disease Brother     Social history: Lives in a apartment with carpeting in the bedroom with heat pump heating and cooling.  No pets in the home.  There is no concern for water damage, mildew or roaches in the home.  She is retired.  Denies a smoking history.   Medication List: Current Outpatient Medications  Medication Sig Dispense Refill   acetaminophen (TYLENOL) 325 MG tablet Take 650 mg by mouth every 6 (six)  hours as needed.     aspirin 81 MG EC tablet Chew 1 tablet by mouth daily.     azelastine (ASTELIN) 0.1 % nasal spray Place 2 sprays into both nostrils 2 (two) times daily as needed. 30 mL 5   AZO D-MANNOSE PO Take by mouth.     cabergoline (DOSTINEX) 0.5 MG tablet Take 0.25 mg by mouth 2 (two) times a week.     calcium citrate (CALCITRATE - DOSED IN MG ELEMENTAL CALCIUM) 950 (200 Ca) MG tablet Take 630 mg of elemental calcium by mouth daily.     celecoxib (CELEBREX) 200 MG capsule Take 1 capsule (200 mg total) by mouth daily. 30 capsule 0   cloNIDine (CATAPRES) 0.1 MG tablet Take 1 tablet (0.1 mg total) by mouth at bedtime. 60 tablet 0   conjugated estrogens (PREMARIN) vaginal cream Place 1 applicator vaginally daily.     docusate sodium (COLACE) 50 MG capsule Take 50 mg by  mouth at bedtime.     fexofenadine (ALLEGRA) 180 MG tablet Take 1 tablet (180 mg total) by mouth daily as needed for allergies or rhinitis. 30 tablet 5   fluticasone (FLONASE) 50 MCG/ACT nasal spray Place 1 spray into both nostrils daily as needed for allergies. 1 g 1   levothyroxine (SYNTHROID) 25 MCG tablet Take 25 mcg by mouth daily before breakfast.      losartan (COZAAR) 50 MG tablet Take 1 tablet (50 mg total) by mouth daily. 90 tablet 1   Magnesium 400 MG TABS Take 400 mg by mouth daily.     methocarbamol (ROBAXIN) 500 MG tablet Take 1 tablet (500 mg total) by mouth 4 (four) times daily. 360 tablet 1   metoprolol succinate (TOPROL-XL) 25 MG 24 hr tablet Take 1 tablet (25 mg total) by mouth daily. 90 tablet 1   montelukast (SINGULAIR) 10 MG tablet Take 10 mg by mouth at bedtime.   1   Multiple Vitamins-Minerals (HAIR SKIN & NAILS ADVANCED PO) Take by mouth.     Omega-3 Fatty Acids (FISH OIL) 1000 MG CAPS Take 1 capsule by mouth in the morning and at bedtime.      ondansetron (ZOFRAN) 4 MG tablet Take 1 tablet (4 mg total) by mouth every 8 (eight) hours as needed for nausea or vomiting. 30 tablet 0   pantoprazole (PROTONIX) 40 MG tablet Take 40 mg by mouth daily.     polyethylene glycol (MIRALAX / GLYCOLAX) 17 g packet Take 17 g by mouth 2 (two) times daily. 28 packet 0   pravastatin (PRAVACHOL) 40 MG tablet TAKE 1 TABLET BY MOUTH EVERY DAY 90 tablet 1   pregabalin (LYRICA) 75 MG capsule Take 1 capsule (75 mg total) by mouth 2 (two) times daily. 60 capsule 2   QUEtiapine (SEROQUEL) 25 MG tablet Take 1 tablet (25 mg total) by mouth at bedtime. 30 tablet 3   sertraline (ZOLOFT) 50 MG tablet Take 1 tablet (50 mg total) by mouth daily. 90 tablet 3   No current facility-administered medications for this visit.    Known medication allergies: Allergies  Allergen Reactions   Amoxicillin-Pot Clavulanate Nausea Only    Other reaction(s): stomach upset   Bupropion Nausea Only    Other  reaction(s): stomach upset     Physical examination: Blood pressure 136/82, pulse 69, resp. rate 16, height 5\' 2"  (1.575 m), weight 203 lb 6.4 oz (92.3 kg), SpO2 95%.  General: Alert, interactive, in no acute distress. HEENT: PERRLA, TMs pearly gray, turbinates minimally edematous  without discharge, post-pharynx non erythematous. Neck: Supple without lymphadenopathy. Lungs: Clear to auscultation without wheezing, rhonchi or rales. {no increased work of breathing. CV: Normal S1, S2 without murmurs. Abdomen: Nondistended, nontender. Skin: Warm and dry, without lesions or rashes. Extremities:  No clubbing, cyanosis.  Right flank area edematous Neuro:   Grossly intact.  Diagnositics/Labs: None today  Assessment and plan: Chronic rhinitis with postnasal drip  chronic postnasal drip and throat clearing, occasional sneezing, and stuffiness.  -Continue Flonase 2 sprays each nostril daily for 1-2 weeks at a time before stopping once nasal congestion improves for maximum benefit -Add Astepro (nasal antihistamine) 2 sprays each nostril twice daily as needed for postnasal drip and throat clearing. -Continue Singulair 10mg  daily at this time -Use Allegra 180mg  daily as needed -Perform allergy skin testing to identify potential allergens and guide avoidance measures. Schedule for skin testing visit and hold antihistamine for 3 days prior to this visit  I appreciate the opportunity to take part in Kimberly Larsen's care. Please do not hesitate to contact me with questions.  Sincerely,   Margo Aye, MD Allergy/Immunology [facility]

## 2023-02-18 NOTE — Patient Instructions (Signed)
Chronic postnasal drip and throat clearing, occasional sneezing, and stuffiness.  -Continue Flonase 2 sprays each nostril daily for 1-2 weeks at a time before stopping once nasal congestion improves for maximum benefit -Add Astepro (nasal antihistamine) 2 sprays each nostril twice daily as needed for postnasal drip and throat clearing. -Continue Singulair 10mg  daily at this time -Use Allegra 180mg  daily as needed -Perform allergy skin testing to identify potential allergens and guide avoidance measures. Schedule for skin testing visit and hold antihistamine for 3 days prior to this visit

## 2023-02-19 DIAGNOSIS — R1010 Upper abdominal pain, unspecified: Secondary | ICD-10-CM | POA: Diagnosis not present

## 2023-02-19 DIAGNOSIS — K5730 Diverticulosis of large intestine without perforation or abscess without bleeding: Secondary | ICD-10-CM | POA: Diagnosis not present

## 2023-02-19 DIAGNOSIS — N200 Calculus of kidney: Secondary | ICD-10-CM | POA: Diagnosis not present

## 2023-02-19 DIAGNOSIS — R109 Unspecified abdominal pain: Secondary | ICD-10-CM | POA: Diagnosis not present

## 2023-02-19 DIAGNOSIS — K449 Diaphragmatic hernia without obstruction or gangrene: Secondary | ICD-10-CM | POA: Diagnosis not present

## 2023-02-24 ENCOUNTER — Encounter: Payer: Self-pay | Admitting: Internal Medicine

## 2023-02-25 ENCOUNTER — Other Ambulatory Visit: Payer: Self-pay | Admitting: Internal Medicine

## 2023-02-25 ENCOUNTER — Ambulatory Visit: Payer: Medicare HMO | Admitting: Allergy

## 2023-02-28 ENCOUNTER — Other Ambulatory Visit: Payer: Self-pay | Admitting: Internal Medicine

## 2023-03-09 NOTE — Progress Notes (Unsigned)
Cardiology Office Note:  .   Date:  03/10/2023  ID:  Kimberly Larsen, DOB May 16, 1945, MRN [id number] PCP: Crist Fat, MD  [facility] Providers Cardiologist:  None    History of Present Illness: .   Kimberly Larsen is a 77 y.o. female with a past medical history of aortic atherosclerosis noted on CT imaging, hypertension, history of endometrial cancer, dyslipidemia, fibromyalgia, chronic back pain, history of gastroparesis.  11/26/2020 echo EF 60 to 65%, moderate concentric LVH, grade 1 DD, trivial MR, mild aortic valve sclerosis without stenosis 08/26/2019 Lexiscan normal, low risk study  Most recently evaluated by Dr. Bing Matter on 03/14/2022, she was doing well from a cardiac perspective, she was bothered with some shortness of breath however this was felt to be due to deconditioning.  She presents today for follow-up.  Has been doing well from a cardiac perspective.  She recounts where she apparently fell out of her bed on October 1, striking her back on a stepstool.  She has been having ongoing pain after this, was evaluated by her PCP and there was apparently a hematoma noted to her right lower lateral back, she was advised to keep heat on it.  Over the last few days she feels like it is increased in size and is warm to the touch--this is her only complaint today.  There is a quite large hematoma noted, she states she had plans to see her PCP later in the day for further evaluation of this. She denies chest pain, palpitations, dyspnea, pnd, orthopnea, n, v, dizziness, syncope, edema, weight gain, or early satiety.   ROS: Review of Systems  Musculoskeletal:  Positive for back pain.  All other systems reviewed and are negative.    Studies Reviewed: Marland Kitchen   EKG Interpretation Date/Time:  Monday March 10 2023 11:27:35 EST Ventricular Rate:  69 PR Interval:  170 QRS Duration:  88 QT Interval:  400 QTC Calculation: 428 R Axis:   18  Text Interpretation: Normal sinus rhythm Septal  infarct (cited on or before 03-Dec-2019) When compared with ECG of 01-Jan-2022 22:29, Questionable change in initial forces of Anterior leads Confirmed by Wallis Bamberg [id number]) on 03/10/2023 12:52:12 PM    Cardiac Studies & Procedures     STRESS TESTS  MYOCARDIAL PERFUSION IMAGING 08/26/2019  Narrative  The left ventricular ejection fraction is normal (55-65%).  Nuclear stress EF: 61%.  The study is normal.  This is a low risk study.   ECHOCARDIOGRAM  ECHOCARDIOGRAM COMPLETE 11/24/2020  Narrative ECHOCARDIOGRAM REPORT    Patient Name:   Kimberly Larsen Date of Exam: 11/24/2020 Medical Rec #:  [id number]     Height:       62.0 in Accession #:    [id number]    Weight:       196.5 lb Date of Birth:  09-24-1945      BSA:          1.897 m Patient Age:    74 years      BP:           139/57 mmHg Patient Gender: F             HR:           73 bpm. Exam Location:  [facility]  Procedure: 2D Echo  Indications:    Nonspecific abnormal electrocardiogram (ECG) (EKG) [R94.31 (ICD-10-CM)]; Mixed hyperlipidemia [E78.2 (ICD-10-CM)]; Dyspnea on exertion [R06.00 (ICD-10-CM)]  History:        Patient has prior  history of Echocardiogram examinations, most recent 09/09/2019.  Sonographer:    Louie Boston Referring Phys: [phone] ROBERT J KRASOWSKI  IMPRESSIONS   1. Left ventricular ejection fraction, by estimation, is 60 to 65%. The left ventricle has normal function. The left ventricle has no regional wall motion abnormalities. There is moderate concentric left ventricular hypertrophy. Left ventricular diastolic parameters are consistent with Grade I diastolic dysfunction (impaired relaxation). The average left ventricular global longitudinal strain is -12.2 %. The global longitudinal strain is abnormal. 2. Right ventricular systolic function is normal. The right ventricular size is normal. There is normal pulmonary artery systolic pressure. 3. The mitral valve is degenerative. Trivial mitral  valve regurgitation. No evidence of mitral stenosis. 4. The aortic valve is tricuspid. Aortic valve regurgitation is not visualized. Mild aortic valve sclerosis is present, with no evidence of aortic valve stenosis. 5. The inferior vena cava is normal in size with greater than 50% respiratory variability, suggesting right atrial pressure of 3 mmHg.  FINDINGS Left Ventricle: Left ventricular ejection fraction, by estimation, is 60 to 65%. The left ventricle has normal function. The left ventricle has no regional wall motion abnormalities. The average left ventricular global longitudinal strain is -12.2 %. The global longitudinal strain is abnormal. The left ventricular internal cavity size was normal in size. There is moderate concentric left ventricular hypertrophy. Left ventricular diastolic parameters are consistent with Grade I diastolic dysfunction (impaired relaxation). Indeterminate filling pressures.  Right Ventricle: The right ventricular size is normal. No increase in right ventricular wall thickness. Right ventricular systolic function is normal. There is normal pulmonary artery systolic pressure. The tricuspid regurgitant velocity is 2.42 m/s, and with an assumed right atrial pressure of 3 mmHg, the estimated right ventricular systolic pressure is 26.4 mmHg.  Left Atrium: Left atrial size was normal in size.  Right Atrium: Right atrial size was normal in size.  Pericardium: There is no evidence of pericardial effusion.  Mitral Valve: The mitral valve is degenerative in appearance. Mild mitral annular calcification. Trivial mitral valve regurgitation. No evidence of mitral valve stenosis.  Tricuspid Valve: The tricuspid valve is normal in structure. Tricuspid valve regurgitation is trivial. No evidence of tricuspid stenosis.  Aortic Valve: The aortic valve is tricuspid. Aortic valve regurgitation is not visualized. Mild aortic valve sclerosis is present, with no evidence of aortic  valve stenosis.  Pulmonic Valve: The pulmonic valve was normal in structure. Pulmonic valve regurgitation is not visualized. No evidence of pulmonic stenosis.  Aorta: The aortic root and ascending aorta are structurally normal, with no evidence of dilitation and the aortic arch was not well visualized.  Venous: The pulmonary veins were not well visualized. The inferior vena cava is normal in size with greater than 50% respiratory variability, suggesting right atrial pressure of 3 mmHg.  IAS/Shunts: No atrial level shunt detected by color flow Doppler.   LEFT VENTRICLE PLAX 2D LVIDd:         4.10 cm  Diastology LVIDs:         2.70 cm  LV e' medial:    4.24 cm/s LV PW:         1.30 cm  LV E/e' medial:  20.6 LV IVS:        1.30 cm  LV e' lateral:   5.87 cm/s LVOT diam:     2.00 cm  LV E/e' lateral: 14.9 LV SV:         65 LV SV Index:   34  2D Longitudinal Strain LVOT Area:     3.14 cm 2D Strain GLS Avg:     -12.2 %   RIGHT VENTRICLE             IVC RV S prime:     11.20 cm/s  IVC diam: 1.80 cm  LEFT ATRIUM             Index       RIGHT ATRIUM           Index LA diam:        3.90 cm 2.06 cm/m  RA Area:     12.40 cm LA Vol (A2C):   60.1 ml 31.67 ml/m RA Volume:   26.30 ml  13.86 ml/m LA Vol (A4C):   44.9 ml 23.66 ml/m LA Biplane Vol: 52.6 ml 27.72 ml/m AORTIC VALVE LVOT Vmax:   103.00 cm/s LVOT Vmean:  74.700 cm/s LVOT VTI:    0.206 m  AORTA Ao Root diam: 3.50 cm Ao Asc diam:  3.50 cm Ao Desc diam: 2.53 cm  MITRAL VALVE                TRICUSPID VALVE MV Area (PHT): 6.90 cm     TR Peak grad:   23.4 mmHg MV Decel Time: 110 msec     TR Vmax:        242.00 cm/s MV E velocity: 87.30 cm/s MV A velocity: 156.00 cm/s  SHUNTS MV E/A ratio:  0.56         Systemic VTI:  0.21 m Systemic Diam: 2.00 cm  Norman Herrlich MD Electronically signed by Norman Herrlich MD Signature Date/Time: 11/26/2020/11:52:17 AM    Final             Risk Assessment/Calculations:              Physical Exam:   VS:  BP 123/65 (BP Location: Left Arm, Patient Position: Sitting, Cuff Size: Normal)   Pulse 69   Ht 5\' 2"  (1.575 m)   Wt 203 lb (92.1 kg)   SpO2 97%   BMI 37.13 kg/m    Wt Readings from Last 3 Encounters:  03/10/23 203 lb (92.1 kg)  02/18/23 203 lb 6.4 oz (92.3 kg)  02/17/23 206 lb (93.4 kg)    GEN: Well nourished, well developed in no acute distress NECK: No JVD; No carotid bruits CARDIAC: RRR, no murmurs, rubs, gallops RESPIRATORY:  Clear to auscultation without rales, wheezing or rhonchi  ABDOMEN: Soft, non-tender, non-distended EXTREMITIES:  No edema; No deformity   ASSESSMENT AND PLAN: .   Aortic atherosclerosis-noted on CT imaging, Stable with no anginal symptoms. No indication for ischemic evaluation.  Continue aspirin 81 mg daily, continue metoprolol 25 mg daily, continue pravastatin.  Hematoma-noted to her right lower back, initially occurred about 5 weeks ago after she fell out of her bed onto a footstool, was evaluated by her PCP initially was advised to proceed with supportive measures however the last few days she has noticed that is gotten larger in size.  She says she has plans to see her PCP later today, strongly encouraged that she follow-up with them today.  Will check a CBC today and forward to her PCP office.    Dyslipidemia-states this is formally managed by her PCP, would prefer her LDL be less than 70 since she has aortic atherosclerosis noted on CT imaging.  Hypertension-blood pressure is well-controlled at 123/65, continue losartan 50 mg daily.    Dispo: CBC, BMET, follow-up  in 6 months.  Signed, Flossie Dibble, NP

## 2023-03-10 ENCOUNTER — Ambulatory Visit: Payer: Medicare HMO | Attending: Cardiology | Admitting: Cardiology

## 2023-03-10 ENCOUNTER — Encounter: Payer: Self-pay | Admitting: Cardiology

## 2023-03-10 VITALS — BP 123/65 | HR 69 | Ht 62.0 in | Wt 203.0 lb

## 2023-03-10 DIAGNOSIS — I700 Atherosclerosis of aorta: Secondary | ICD-10-CM | POA: Diagnosis not present

## 2023-03-10 DIAGNOSIS — S300XXS Contusion of lower back and pelvis, sequela: Secondary | ICD-10-CM

## 2023-03-10 DIAGNOSIS — Z79899 Other long term (current) drug therapy: Secondary | ICD-10-CM

## 2023-03-10 DIAGNOSIS — I10 Essential (primary) hypertension: Secondary | ICD-10-CM | POA: Diagnosis not present

## 2023-03-10 DIAGNOSIS — E785 Hyperlipidemia, unspecified: Secondary | ICD-10-CM | POA: Diagnosis not present

## 2023-03-10 NOTE — Patient Instructions (Signed)
Medication Instructions:  Your physician recommends that you continue on your current medications as directed. Please refer to the Current Medication list given to you today.  *If you need a refill on your cardiac medications before your next appointment, please call your pharmacy*   Lab Work: Your physician recommends that you return for lab work in: Today for CBC and CMP  If you have labs (blood work) drawn today and your tests are completely normal, you will receive your results only by: MyChart Message (if you have MyChart) OR A paper copy in the mail If you have any lab test that is abnormal or we need to change your treatment, we will call you to review the results.   Testing/Procedures: NONE   Follow-Up: At [facility], you and your health needs are our priority.  As part of our continuing mission to provide you with exceptional heart care, we have created designated Provider Care Teams.  These Care Teams include your primary Cardiologist (physician) and Advanced Practice Providers (APPs -  Physician Assistants and Nurse Practitioners) who all work together to provide you with the care you need, when you need it.  We recommend signing up for the patient portal called "MyChart".  Sign up information is provided on this After Visit Summary.  MyChart is used to connect with patients for Virtual Visits (Telemedicine).  Patients are able to view lab/test results, encounter notes, upcoming appointments, etc.  Non-urgent messages can be sent to your provider as well.   To learn more about what you can do with MyChart, go to [email or web address].    Your next appointment:   6 month(s)  Provider:   Gypsy Balsam, MD    Other Instructions

## 2023-03-11 ENCOUNTER — Telehealth: Payer: Self-pay | Admitting: *Deleted

## 2023-03-11 LAB — COMPREHENSIVE METABOLIC PANEL WITH GFR
ALT: 20 IU/L (ref 0–32)
AST: 25 IU/L (ref 0–40)
Albumin: 4.1 g/dL (ref 3.8–4.8)
Alkaline Phosphatase: 142 IU/L — ABNORMAL HIGH (ref 44–121)
BUN/Creatinine Ratio: 22 (ref 12–28)
BUN: 18 mg/dL (ref 8–27)
Bilirubin Total: 0.5 mg/dL (ref 0.0–1.2)
CO2: 24 mmol/L (ref 20–29)
Calcium: 9.5 mg/dL (ref 8.7–10.3)
Chloride: 102 mmol/L (ref 96–106)
Creatinine, Ser: 0.82 mg/dL (ref 0.57–1.00)
Globulin, Total: 2.5 g/dL (ref 1.5–4.5)
Glucose: 97 mg/dL (ref 70–99)
Potassium: 5.1 mmol/L (ref 3.5–5.2)
Sodium: 141 mmol/L (ref 134–144)
Total Protein: 6.6 g/dL (ref 6.0–8.5)
eGFR: 75 mL/min/1.73

## 2023-03-11 LAB — CBC WITH DIFFERENTIAL/PLATELET
Basophils Absolute: 0.1 x10E3/uL (ref 0.0–0.2)
Basos: 1 %
EOS (ABSOLUTE): 0.3 x10E3/uL (ref 0.0–0.4)
Eos: 3 %
Hematocrit: 39.4 % (ref 34.0–46.6)
Hemoglobin: 12.4 g/dL (ref 11.1–15.9)
Immature Grans (Abs): 0 x10E3/uL (ref 0.0–0.1)
Immature Granulocytes: 0 %
Lymphocytes Absolute: 0.6 x10E3/uL — ABNORMAL LOW (ref 0.7–3.1)
Lymphs: 7 %
MCH: 29.7 pg (ref 26.6–33.0)
MCHC: 31.5 g/dL (ref 31.5–35.7)
MCV: 95 fL (ref 79–97)
Monocytes Absolute: 0.5 x10E3/uL (ref 0.1–0.9)
Monocytes: 5 %
Neutrophils Absolute: 7.7 x10E3/uL — ABNORMAL HIGH (ref 1.4–7.0)
Neutrophils: 84 %
Platelets: 267 x10E3/uL (ref 150–450)
RBC: 4.17 x10E6/uL (ref 3.77–5.28)
RDW: 13.2 % (ref 11.7–15.4)
WBC: 9.2 x10E3/uL (ref 3.4–10.8)

## 2023-03-11 NOTE — Telephone Encounter (Signed)
Informed pt of normal labs. Pt verbalized understanding and had no further questions

## 2023-03-11 NOTE — Telephone Encounter (Signed)
-----   Message from Flossie Dibble sent at 03/11/2023  7:44 AM EST ----- Labs looked good.  Forwarding to PCP.

## 2023-03-14 ENCOUNTER — Ambulatory Visit: Payer: Medicare HMO | Admitting: Internal Medicine

## 2023-03-14 ENCOUNTER — Encounter: Payer: Self-pay | Admitting: Internal Medicine

## 2023-03-14 VITALS — BP 130/78 | HR 94 | Temp 98.1°F | Resp 17 | Ht 62.0 in | Wt 206.4 lb

## 2023-03-14 DIAGNOSIS — M7981 Nontraumatic hematoma of soft tissue: Secondary | ICD-10-CM | POA: Diagnosis not present

## 2023-03-14 DIAGNOSIS — T148XXA Other injury of unspecified body region, initial encounter: Secondary | ICD-10-CM

## 2023-03-14 NOTE — Assessment & Plan Note (Signed)
I had told her on her last visit that it would take few months for her hematoma to resolve.  I had spoken to the surgeon about this and he looked at her CT scan and did not recommend any drainage.  I want her to use warm compresses and give it time.  It looks smaller to me today than on her last visit.

## 2023-03-14 NOTE — Progress Notes (Signed)
Office Visit  Subjective   [name]   DOB: 25-Jun-1945   Age: 77 y.o.   MRN: [id number]   Chief Complaint No chief complaint on file.    History of Present Illness Kimberly Larsen is a 77 yo female who returns once again with continued pain and swelling from her right flank hematoma.  I saw her 3 weeks ago and felt that she could just put heat on this and take tylenol for pain.  She states it has not become more enlarged but her pain has not improved.  Again, she had a fall 6 weeks ago where she fell from her bed to the floor.  She went to [facility] ER and x-rays showed a right-sided ninth rib and 10th rib fractures and L1 right transverse process fracture.  They also did a CT scan of her abd/pelvis with contrast that showed the above fractures with a mixed density collection in the subcutaneous fat over the right flank region consistent with soft tissue hematoma.  There was non-obstructing kidney stones and aortic atherosclerosis.  The ER gave her oxycodone and she was advised to follow-up with [place].  She saw my partner on 10/7/ who gave her lidoderm patches but she was not able to put these on.  She is also on an ASA 81mg  daily but no blood thinnners.  We did obtain a CT scan of her  on 02/19/2023 and this showed an old hematoma measuring 16.8cm x 7.1cm x 13.5cm without gas indicating infection.  Today, she states this area has no increased warmth or increased pain. She is coming in today as the hematoma has not resolved (I told her last visit that it could take months for resolution).     Past Medical History Past Medical History:  Diagnosis Date   Acute postoperative respiratory failure (HCC) 03/25/2014   Anesthesia of skin    Anxiety    Aortic atherosclerosis (HCC)    Arthritis    Back pain    Benign essential HTN 03/25/2014   Cancer (HCC)    endometrial cancer   Chronic daily headache 10/23/2017   Chronic low back pain 07/30/2017   Chronic pain syndrome 02/23/2014   DDD  (degenerative disc disease), lumbar    Depression    Endometrial cancer (HCC) 01/10/2016   Fibromyalgia 09/08/2012   Gastroparesis    Genetic testing 12/17/2017   T[facility] CancerNext was ordered. The CancerNext gene panel offered by [employer] includes sequencing and rearrangement analysis for the following 32 genes:   APC, ATM, BARD1, BMPR1A, BRCA1, BRCA2, BRIP1, CDH1, CDK4, CDKN2A, CHEK2, DICER1, EPCAM, GREM1, HOXB13MLH1, MRE11A, MSH2, MSH6, MUTYH, NBN, NF1, PALB2, PMS2, POLD1, POLE, PTEN, RAD50, RAD51D, SMAD4, SMARCA4, STK11, and TP53.   Ger   History of kidney stones    Hyperlipidemia 03/25/2014   Insomnia 09/08/2012   Long-term current use of opiate analgesic 05/28/2017   Lumbar post-laminectomy syndrome 07/30/2017   Near syncope 07/10/2017   NEPHROLITHIASIS 06/26/2006   Qualifier: Diagnosis of  By: Bebe Shaggy     Obese 11/11/2018   Obesity (BMI 30-39.9) 09/02/2012   Obstructive uropathy 03/25/2014   Osteoporosis    Prolactinoma (HCC) 07/16/2017   S/P left TKA 11/10/2018   S/P right TKA 08/31/2012   UTI (urinary tract infection)      Allergies Allergies  Allergen Reactions   Amoxicillin-Pot Clavulanate Nausea Only    Other reaction(s): stomach upset   Bupropion Nausea Only    Other reaction(s): stomach upset  Medications  Current Outpatient Medications:    acetaminophen (TYLENOL) 325 MG tablet, Take 650 mg by mouth every 6 (six) hours as needed., Disp: , Rfl:    aspirin 81 MG EC tablet, Chew 1 tablet by mouth daily., Disp: , Rfl:    azelastine (ASTELIN) 0.1 % nasal spray, Place 2 sprays into both nostrils 2 (two) times daily as needed., Disp: 30 mL, Rfl: 5   AZO D-MANNOSE PO, Take by mouth., Disp: , Rfl:    cabergoline (DOSTINEX) 0.5 MG tablet, Take 0.25 mg by mouth 2 (two) times a week., Disp: , Rfl:    calcium citrate (CALCITRATE - DOSED IN MG ELEMENTAL CALCIUM) 950 (200 Ca) MG tablet, Take 630 mg of elemental calcium by mouth daily., Disp: ,  Rfl:    celecoxib (CELEBREX) 200 MG capsule, TAKE 1 CAPSULE BY MOUTH EVERY DAY, Disp: 30 capsule, Rfl: 0   cloNIDine (CATAPRES) 0.1 MG tablet, Take 1 tablet (0.1 mg total) by mouth at bedtime., Disp: 60 tablet, Rfl: 0   docusate sodium (COLACE) 50 MG capsule, Take 50 mg by mouth at bedtime., Disp: , Rfl:    fluticasone (FLONASE) 50 MCG/ACT nasal spray, Place 1 spray into both nostrils daily as needed for allergies., Disp: 1 g, Rfl: 1   levothyroxine (SYNTHROID) 25 MCG tablet, Take 25 mcg by mouth daily before breakfast. , Disp: , Rfl:    losartan (COZAAR) 50 MG tablet, Take 1 tablet (50 mg total) by mouth daily., Disp: 90 tablet, Rfl: 1   Magnesium 400 MG TABS, Take 400 mg by mouth daily., Disp: , Rfl:    methocarbamol (ROBAXIN) 500 MG tablet, Take 1 tablet (500 mg total) by mouth 4 (four) times daily., Disp: 360 tablet, Rfl: 1   metoprolol succinate (TOPROL-XL) 25 MG 24 hr tablet, Take 1 tablet (25 mg total) by mouth daily., Disp: 90 tablet, Rfl: 1   montelukast (SINGULAIR) 10 MG tablet, Take 10 mg by mouth at bedtime. , Disp: , Rfl: 1   Multiple Vitamins-Minerals (HAIR SKIN & NAILS ADVANCED PO), Take by mouth., Disp: , Rfl:    Omega-3 Fatty Acids (FISH OIL) 1000 MG CAPS, Take 1 capsule by mouth in the morning and at bedtime. , Disp: , Rfl:    ondansetron (ZOFRAN) 4 MG tablet, Take 1 tablet (4 mg total) by mouth every 8 (eight) hours as needed for nausea or vomiting., Disp: 30 tablet, Rfl: 0   pantoprazole (PROTONIX) 40 MG tablet, Take 40 mg by mouth daily., Disp: , Rfl:    polyethylene glycol (MIRALAX / GLYCOLAX) 17 g packet, Take 17 g by mouth 2 (two) times daily., Disp: 28 packet, Rfl: 0   pravastatin (PRAVACHOL) 40 MG tablet, TAKE 1 TABLET BY MOUTH EVERY DAY, Disp: 90 tablet, Rfl: 1   pregabalin (LYRICA) 75 MG capsule, Take 1 capsule (75 mg total) by mouth 2 (two) times daily., Disp: 60 capsule, Rfl: 2   QUEtiapine (SEROQUEL) 25 MG tablet, TAKE 1 TABLET BY MOUTH EVERYDAY AT BEDTIME, Disp: 90  tablet, Rfl: 2   sertraline (ZOLOFT) 50 MG tablet, Take 1 tablet (50 mg total) by mouth daily., Disp: 90 tablet, Rfl: 3   Review of Systems Review of Systems  Constitutional:  Negative for chills and fever.  Respiratory:  Negative for cough and shortness of breath.   Cardiovascular:  Negative for chest pain, palpitations and leg swelling.  Gastrointestinal:  Negative for abdominal pain, diarrhea, nausea and vomiting.  Genitourinary:  Negative for flank pain.  Skin:  Negative  for itching and rash.  Neurological:  Positive for headaches. Negative for dizziness and weakness.       Objective:    Vitals BP 130/78   Pulse 94   Temp 98.1 F (36.7 C)   Resp 17   Ht 5\' 2"  (1.575 m)   Wt 206 lb 6.4 oz (93.6 kg)   SpO2 97%   BMI 37.75 kg/m    Physical Examination Physical Exam Constitutional:      Appearance: Normal appearance. She is not ill-appearing.  Cardiovascular:     Rate and Rhythm: Normal rate and regular rhythm.     Pulses: Normal pulses.     Heart sounds: No murmur heard.    No friction rub. No gallop.  Pulmonary:     Effort: Pulmonary effort is normal. No respiratory distress.     Breath sounds: No wheezing, rhonchi or rales.  Abdominal:     General: Bowel sounds are normal. There is no distension.     Palpations: Abdomen is soft.     Tenderness: There is no abdominal tenderness.  Musculoskeletal:     Right lower leg: No edema.     Left lower leg: No edema.  Skin:    General: Skin is warm and dry.     Findings: No rash.  Neurological:     Mental Status: She is alert.        Assessment & Plan:   Hematoma I had told her on her last visit that it would take few months for her hematoma to resolve.  I had spoken to the surgeon about this and he looked at her CT scan and did not recommend any drainage.  I want her to use warm compresses and give it time.  It looks smaller to me today than on her last visit.    No follow-ups on file.   Crist Fat, MD

## 2023-03-17 DIAGNOSIS — L853 Xerosis cutis: Secondary | ICD-10-CM | POA: Diagnosis not present

## 2023-03-23 DIAGNOSIS — L853 Xerosis cutis: Secondary | ICD-10-CM | POA: Diagnosis not present

## 2023-03-23 DIAGNOSIS — L301 Dyshidrosis [pompholyx]: Secondary | ICD-10-CM | POA: Diagnosis not present

## 2023-03-29 ENCOUNTER — Other Ambulatory Visit: Payer: Self-pay | Admitting: Internal Medicine

## 2023-03-31 DIAGNOSIS — L299 Pruritus, unspecified: Secondary | ICD-10-CM | POA: Diagnosis not present

## 2023-03-31 DIAGNOSIS — L301 Dyshidrosis [pompholyx]: Secondary | ICD-10-CM | POA: Diagnosis not present

## 2023-04-02 ENCOUNTER — Other Ambulatory Visit: Payer: Self-pay | Admitting: Internal Medicine

## 2023-04-02 MED ORDER — PANTOPRAZOLE SODIUM 40 MG PO TBEC: 40.0000 mg | DELAYED_RELEASE_TABLET | Freq: Every day | ORAL | 1 refills | Status: DC

## 2023-04-02 MED ORDER — MONTELUKAST SODIUM 10 MG PO TABS: 10.0000 mg | ORAL_TABLET | Freq: Every day | ORAL | 1 refills | Status: DC

## 2023-04-07 DIAGNOSIS — H53461 Homonymous bilateral field defects, right side: Secondary | ICD-10-CM | POA: Diagnosis not present

## 2023-04-07 DIAGNOSIS — H04123 Dry eye syndrome of bilateral lacrimal glands: Secondary | ICD-10-CM | POA: Diagnosis not present

## 2023-04-07 DIAGNOSIS — H26491 Other secondary cataract, right eye: Secondary | ICD-10-CM | POA: Diagnosis not present

## 2023-04-14 ENCOUNTER — Ambulatory Visit: Payer: Medicare HMO | Admitting: Allergy and Immunology

## 2023-04-27 ENCOUNTER — Other Ambulatory Visit: Payer: Self-pay | Admitting: Internal Medicine

## 2023-05-02 ENCOUNTER — Other Ambulatory Visit: Payer: Self-pay

## 2023-05-02 DIAGNOSIS — G894 Chronic pain syndrome: Secondary | ICD-10-CM

## 2023-05-02 MED ORDER — PREGABALIN 75 MG PO CAPS
75.0000 mg | ORAL_CAPSULE | Freq: Two times a day (BID) | ORAL | 2 refills | Status: DC
Start: 2023-05-02 — End: 2023-05-19

## 2023-05-12 ENCOUNTER — Ambulatory Visit: Payer: Medicare HMO | Admitting: Internal Medicine

## 2023-05-19 ENCOUNTER — Ambulatory Visit: Payer: Medicare HMO | Admitting: Internal Medicine

## 2023-05-19 ENCOUNTER — Encounter: Payer: Self-pay | Admitting: Internal Medicine

## 2023-05-19 VITALS — BP 130/74 | HR 71 | Temp 98.6°F | Resp 18 | Ht 62.0 in | Wt 207.2 lb

## 2023-05-19 DIAGNOSIS — E7800 Pure hypercholesterolemia, unspecified: Secondary | ICD-10-CM | POA: Insufficient documentation

## 2023-05-19 DIAGNOSIS — I700 Atherosclerosis of aorta: Secondary | ICD-10-CM | POA: Insufficient documentation

## 2023-05-19 DIAGNOSIS — E039 Hypothyroidism, unspecified: Secondary | ICD-10-CM | POA: Insufficient documentation

## 2023-05-19 DIAGNOSIS — M797 Fibromyalgia: Secondary | ICD-10-CM

## 2023-05-19 DIAGNOSIS — Z8542 Personal history of malignant neoplasm of other parts of uterus: Secondary | ICD-10-CM | POA: Insufficient documentation

## 2023-05-19 DIAGNOSIS — D352 Benign neoplasm of pituitary gland: Secondary | ICD-10-CM | POA: Insufficient documentation

## 2023-05-19 DIAGNOSIS — F411 Generalized anxiety disorder: Secondary | ICD-10-CM

## 2023-05-19 DIAGNOSIS — M150 Primary generalized (osteo)arthritis: Secondary | ICD-10-CM | POA: Insufficient documentation

## 2023-05-19 DIAGNOSIS — I10 Essential (primary) hypertension: Secondary | ICD-10-CM | POA: Insufficient documentation

## 2023-05-19 DIAGNOSIS — G894 Chronic pain syndrome: Secondary | ICD-10-CM

## 2023-05-19 DIAGNOSIS — M810 Age-related osteoporosis without current pathological fracture: Secondary | ICD-10-CM | POA: Insufficient documentation

## 2023-05-19 DIAGNOSIS — M545 Low back pain, unspecified: Secondary | ICD-10-CM

## 2023-05-19 DIAGNOSIS — Z6837 Body mass index (BMI) 37.0-37.9, adult: Secondary | ICD-10-CM | POA: Insufficient documentation

## 2023-05-19 MED ORDER — ONDANSETRON HCL 4 MG PO TABS: 4.0000 mg | ORAL_TABLET | Freq: Three times a day (TID) | ORAL | 0 refills | Status: AC | PRN

## 2023-05-19 MED ORDER — QUETIAPINE FUMARATE 100 MG PO TABS: 100.0000 mg | ORAL_TABLET | Freq: Every day | ORAL | 1 refills | Status: DC

## 2023-05-19 MED ORDER — PREGABALIN 75 MG PO CAPS: 75.0000 mg | ORAL_CAPSULE | Freq: Two times a day (BID) | ORAL | 2 refills | Status: DC

## 2023-05-19 MED ORDER — AZELASTINE HCL 0.1 % NA SOLN: 2.0000 | Freq: Two times a day (BID) | NASAL | 5 refills | Status: DC | PRN

## 2023-05-19 NOTE — Progress Notes (Unsigned)
Preventive Screening-Counseling & Management     Kimberly Larsen is a 78 y.o. female who presents for Medicare Annual/Subsequent preventive examination.  Her last eye exam was done in 02/2023 and she states they found no problems and her vision is doing well.  Her last colonoscopy was done on 03/12/2021 by Dr. Charm Barges per the patient (I have no note).  She states they found one polyp but it was not cancerous.  She had some reflux problems with dysphagia where she had an EGD done on 10/26/2021 which showed a small hiatal hernia and she had an empiric esophageal dilatation done.  Today, she denies any reflux or dysphagia where she on protonix.  The patient does not exercise.  The patient does not smoke.  The patient does obtain yearly flu vaccines.  She states she has had a pneumovax 23 and prevnar 13 vaccines in the past.  She had her shingrix vaccines done on 05/01/2018 and 08/11/2018.  She has had 6 COVID-19 vaccines including 4 boosters.  She denies any memory loss.  She is on ASA 81mg  daily.  I did see her this past year for right flank hematoma.  Over the interim, this is still present but is a lot smaller than her last visit.  She initially injured herself in 01/2022 where I asked her to treat this with tylenol and heat.  She had a fall from her bed to the floor.  She went to [facility] and x-rays showed a right-sided ninth rib and 10th rib fractures and L1 right transverse process fracture.  They also did a CT scan of her abd/pelvis with contrast that showed the above fractures with a mixed density collection in the subcutaneous fat over the right flank region consistent with soft tissue hematoma.  There was non-obstructing kidney stones and aortic atherosclerosis.  The ER gave her oxycodone and she was advised to follow-up with [place].  She saw my partner on 10/7/ who gave her lidoderm patches but she was not able to put these on.  She is also on an ASA 81mg  daily but no blood thinnners.  We did obtain a CT scan  of her  on 02/19/2023 and this showed an old hematoma measuring 16.8cm x 7.1cm x 13.5cm without gas indicating infection.  Today, she states this area has no increased warmth or increased pain.   I had spoken to the surgeon about this and he looked at her CT scan and did not recommend any drainage.   The patient is a 78 year old female who presents for a follow-up evaluation of hypertension. The patient has been checking her blood pressure at home. The patient's blood pressure has ranged systollically 130-140's. The patient's current medications include: losartan 50mg  daily, Toprol XL 25mg  daily and she uses clonidine if needed if her SBP >170.  The patient has been tolerating her medications well. The patient denies any headache, visual changes, dizziness, lightheadness, chest pain, shortness of breath, weakness/numbness, and edema.  She reports there have been no other symptoms noted.   The patient also returns today for routine followup on her cholesterol. Overall, she states she is doing well and is without any complaints or problems at this time. She specifically denies abdominal pain, nausea, vomiting, diarrhea, myalgias, and fatigue. She remains on fish oil and pravastatin 40mg  at bedtime. She is fasting in anticipation for labs today.   The patient is a 78 year old female who returns for a regularly scheduled thyroid check. Since the  last visit, there has been no overall change in her status. She is on levothryoxine po daily.. She claims to have no symptoms suggestive of thyroid imbalance specifically denying fatigue, cold intolerance, heat intolerance, tremors, anxiety, unexplained weight changes, and insomnia.   The patient also has a history of depression and anxiety.  My partner saw her this past year where at the time he felt she had some increased anxiety.  She was on sertraline 50mg  daily and I had adjusted her amitriptyline from 50mg  and uptitrated to 150mg  nightly.   Over the interim,  she has stopped bother her sertraline and her amitripytline.  She denies any depression or anxiety.  She remains on seroquel 25mg  at bedtime for sleep but she states it does not help.  Dr. Nelson Chimes stopped the amitriptyline and placed her on seroquel 25mg  at night time to help with anxiety and insomnia.  The patient denies any depression at this time.  She does have a history of  insomnia.  She states that over the years it has been hard for her to get to sleep.  She has tried melatonin.  She states she was placed on 3 different medicines (not ambien) where none of them worked.  Her previous NP placed her on clonazepam where she states it is not helping.  She is no longer on clonazepam. She states she has tried elavil in the past but they never went up on the dose.   The patient did see Infectious Disease this past year due to recurrent UTI's.  She last saw them in 08/2022 where she has had about 7 UTI's since 12/2021 and until 07/2022.  She had a home test kit that kept showing she was positive.  Her PCP at [facility] was treating it at that time.  She has seen both urology and infectious disease where she saw ID in 08/2022.  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  They did a trial of methamine.  She has a history of kidney stones but no recent problems with this.   The patient has a history of chronic pain syndrome where she has chronic low back pain and fibromyalgia.  She states she has had chronic low back pain since 2013.  She had a lumbar laminectomy and fusion in 2013.  She states she had a spinal stimulator placed in 2015 for her chronic pain but this did not help.  She was on percocet from 2013 until 2020.  She states the NP from [facility] put her on lyrica in 07/2022 and she states this has helped.  She has a history of lumbar DDD and DJD where she has had both of her knees replaced.  She  also has arthritis in her hands.  The patient also has a history of fibromyalgia with pain with pain "all over".  Today, her worse pain in her body is in her left thumb.  She states she has trigger finger where she goes for surgery next week.  She has chronic low back pain that occurs all the time and is rated 5 out of 10 on the pain scale.  The only time she gets relief for this is when she lies down.  The patient currently takes tylenol prn.   She also has a history of seasonal allergies where she is followed by Immunology.  She last saw immunology in  01/2023 where they noted she had chronic rhinitis with postnasal drip with throat clearing, occasional sneezing, and stuffiness.  They wanted her to continue flonase 2 sprays each nostril daily for 1-2 weeks at a time before stopping once nasal congestion improves for maximum benefit.  They also added astepro needed.  She is also on singulair and uses allergra as needed.  They wanted to do skin testing but she never went back.  Her allergies are controlled on her current medications.  The patient also is followed by cardiology as she initially had SOB back in 2022.  She has been seen by Dr. Kirtland Bouchard and his office with last visit in 02/2023 where she has a past medical history of aortic atherosclerosis noted on CT imaging and hypertension.  She has had an ECHO done on 11/26/2020 showing an EF 60 to 65%, moderate concentric LVH, grade 1 DD, trivial MR, mild aortic valve sclerosis without stenosis.  She had a lexiscan nuclear stress test done on  08/26/2019 and this was normal, low risk study.  Dr. Kirtland Bouchard felt her SOB was due to deconditioning.    She also has history of endometrial cancer where she had where she had stage IA grade 1 endometrioid endometrial adenocarcinoma s/p surgical staging on 01/16/16.  A total hysterectomy was perfommed and this was curative without any chemo or radiation therapy.  She has completed 5 years of surveillance with no recurrence of her cancer.  Therefore we will suspend scheduled surveillance visits.  She is followed also for a history of a pituitary macroadenoma with her last visit with oncology was in 10/2022.  She continues to take cabergoline through Dr. Sharl Ma.  No new or progressive symptoms today.  Denies headaches, seizures.  They have been following her with yearly head CT's and her prolactinoma has been stable.    The patient is a 78 year old female, post-menopausal, who presents for followup of her osteoporosis. She has no specific complaints related to bone loss.  She denies a family history of osteoporosis. The following risk factors are noted: none. She denies the following: diabetes mellitus, high caffeine intake, alcohol consumption of more that 7 ounces per week, daily prednisone use, hyperthyroidism, surgical resection of her bowel, and surgical resection of her stomach. She states she exercises routinely. A bone mineral density study was performed on 05/29/2021 and her t-score was -2.3.         Are there smokers in your home (other than you)? No  Risk Factors Current exercise habits:  as above   Dietary issues discussed: none   Depression Screen (Note: if answer to either of the following is "Yes", a more complete depression screening is indicated)   Over the past two weeks, have you felt down, depressed or hopeless? No  Over the past two weeks, have you felt little interest or pleasure in doing things? No  Have you lost interest or pleasure in daily life? No  Do you often feel hopeless? No  Do you cry easily over simple problems? No  Activities of Daily Living In your present state of health, do you have any difficulty performing the following activities?:  Driving? No Managing money?  No Feeding yourself? No Getting from bed to chair? No Climbing a flight of stairs? No Preparing food and eating?: No Bathing or showering? No Getting dressed: No Getting to the toilet? No Using the toilet:No Moving around  from place to place: No In the past year have you fallen or had a  near fall?:Yes   Are you sexually active?  No  Do you have more than one partner?  No  Hearing Difficulties: No- has wearing aids but does not wear them Do you often ask people to speak up or repeat themselves? No Do you experience ringing or noises in your ears? No Do you have difficulty understanding soft or whispered voices? No   Do you feel that you have a problem with memory? No  Do you often misplace items? No  Do you feel safe at home?  Yes  Cognitive Testing  Alert? Yes  Normal Appearance?Yes  Oriented to person? Yes  Place? Yes   Time? Yes  Recall of three objects?  Yes  Can perform simple calculations? Yes  Displays appropriate judgment?Yes  Can read the correct time from a watch face?Yes  Fall Risk Prevention  Any stairs in or around the home? No  If so, are there any without handrails? No  Home free of loose throw rugs in walkways, pet beds, electrical cords, etc? Yes  Adequate lighting in your home to reduce risk of falls? Yes  Use of a cane, walker or w/c? Yes - she uses walker for longer distances   Time Up and Go  Was the test performed? {YES/NO:21197}.  Length of time to ambulate 10 feet: *** sec.   {Appearance of ZOXW:9604540}    Advanced Directives have been discussed with the patient? Yes   List the Names of Other Physician/Practitioners you currently use: Patient Care Team: Crist Fat, MD as PCP - General (Internal Medicine) Georgeanna Lea, MD as PCP - Cardiology (Cardiology) Talmage Coin, MD as Consulting Physician (Endocrinology)    Past Medical History:  Diagnosis Date   Acute postoperative respiratory failure (HCC) 03/25/2014   Anesthesia of skin    Anxiety    Aortic atherosclerosis (HCC)    Arthritis    Back pain    Benign essential HTN 03/25/2014   Cancer (HCC)    endometrial cancer   Chronic daily headache 10/23/2017   Chronic low back pain 07/30/2017    Chronic pain syndrome 02/23/2014   DDD (degenerative disc disease), lumbar    Depression    Endometrial cancer (HCC) 01/10/2016   Fibromyalgia 09/08/2012   Gastroparesis    Genetic testing 12/17/2017   TumorNext [facility] + CancerNext was ordered. The CancerNext gene panel offered by [employer] includes sequencing and rearrangement analysis for the following 32 genes:   APC, ATM, BARD1, BMPR1A, BRCA1, BRCA2, BRIP1, CDH1, CDK4, CDKN2A, CHEK2, DICER1, EPCAM, GREM1, HOXB13MLH1, MRE11A, MSH2, MSH6, MUTYH, NBN, NF1, PALB2, PMS2, POLD1, POLE, PTEN, RAD50, RAD51D, SMAD4, SMARCA4, STK11, and TP53.   Ger   History of kidney stones    Hyperlipidemia 03/25/2014   Insomnia 09/08/2012   Long-term current use of opiate analgesic 05/28/2017   Lumbar post-laminectomy syndrome 07/30/2017   Near syncope 07/10/2017   NEPHROLITHIASIS 06/26/2006   Qualifier: Diagnosis of  By: Bebe Shaggy     Obese 11/11/2018   Obesity (BMI 30-39.9) 09/02/2012   Obstructive uropathy 03/25/2014   Osteoporosis    Prolactinoma (HCC) 07/16/2017   S/P left TKA 11/10/2018   S/P right TKA 08/31/2012   UTI (urinary tract infection)     Past Surgical History:  Procedure Laterality Date   BACK SURGERY  2013   CARPAL TUNNEL RELEASE     R hand   CYSTOSCOPY WITH URETEROSCOPY AND STENT PLACEMENT Bilateral 03/25/2014   Procedure: CYSTOSCOPY WITH URETEROSCOPY AND STENT PLACEMENT;  Surgeon: Heloise Purpura,  MD;  Location: [facility];  Service: Urology;  Laterality: Bilateral;   CYSTOSCOPY WITH URETEROSCOPY AND STENT PLACEMENT Bilateral 04/18/2014   Procedure: CYSTOSCOPY WITH URETEROSCOPY AND STENT PLACEMENT,RETROGRADE;  Surgeon: Heloise Purpura, MD;  Location: [facility];  Service: Urology;  Laterality: Bilateral;   CYSTOSCOPY WITH URETEROSCOPY AND STENT PLACEMENT Right 05/30/2014   Procedure: CYSTOSCOPY WITH URETEROSCOPY AND STENT PLACEMENT;  Surgeon: Heloise Purpura, MD;  Location: [facility];  Service: Urology;  Laterality: Right;    CYSTOSCOPY/RETROGRADE/URETEROSCOPY Left 05/30/2014   Procedure: LEFT RETROGRADE;  Surgeon: Heloise Purpura, MD;  Location: [facility];  Service: Urology;  Laterality: Left;   CYSTOSCOPY/URETEROSCOPY/HOLMIUM LASER/STENT PLACEMENT Left 11/25/2019   Procedure: CYSTOSCOPY/RETROGRADE/URETEROSCOPY/HOLMIUM LASER/STENT PLACEMENT;  Surgeon: Heloise Purpura, MD;  Location: [facility];  Service: Urology;  Laterality: Left;   EYE SURGERY     cataract surgery bil   GANGLION CYST EXCISION     L ankle   HAMMERTOE RECONSTRUCTION WITH WEIL OSTEOTOMY Left 09/05/2016   Procedure: Second Metatarsal Weil Osteotomy and Hammertoe Correction;  Surgeon: Toni Arthurs, MD;  Location: [facility];  Service: Orthopedics;  Laterality: Left;   HOLMIUM LASER APPLICATION Bilateral 04/18/2014   Procedure: HOLMIUM LASER APPLICATION;  Surgeon: Heloise Purpura, MD;  Location: [facility];  Service: Urology;  Laterality: Bilateral;   JOINT REPLACEMENT     total knee   LITHOTRIPSY     x 3   LUMBAR FUSION  09/2011   METATARSAL OSTEOTOMY WITH BUNIONECTOMY Left 09/05/2016   Procedure: Left First Metatarsal Scarf Osteotomy, Modified McBride Bunion Correction;  Surgeon: Toni Arthurs, MD;  Location: [facility];  Service: Orthopedics;  Laterality: Left;   RHINOPLASTY     ROBOTIC ASSISTED TOTAL HYSTERECTOMY WITH BILATERAL SALPINGO OOPHERECTOMY Bilateral 01/16/2016   Procedure: XI ROBOTIC ASSISTED TOTAL HYSTERECTOMY WITH BILATERAL SALPINGO OOPHORECTOMY AND BILATERAL PELVIC LYMPH NODE DISSECTION;  Surgeon: Adolphus Birchwood, MD;  Location: [facility];  Service: Gynecology;  Laterality: Bilateral;   SPINAL CORD STIMULATOR INSERTION N/A 02/23/2014   Procedure: SPINAL CORD STIMULATOR PLACEMENT ;  Surgeon: Venita Lick, MD;  Location: [facility];  Service: Orthopedics;  Laterality: N/A;   TONSILLECTOMY     TOTAL KNEE ARTHROPLASTY Right 08/31/2012   Procedure: RIGHT TOTAL KNEE ARTHROPLASTY;  Surgeon: [name], MD;  Location: [facility];  Service:  Orthopedics;  Laterality: Right;  with LMA   TOTAL KNEE ARTHROPLASTY Left 11/10/2018   Procedure: TOTAL KNEE ARTHROPLASTY;  Surgeon: Durene Romans, MD;  Location: [facility];  Service: Orthopedics;  Laterality: Left;  70 mins   TUBAL LIGATION        Current Medications  Current Outpatient Medications  Medication Sig Dispense Refill   QUEtiapine (SEROQUEL) 100 MG tablet Take 1 tablet (100 mg total) by mouth at bedtime. 90 tablet 1   acetaminophen (TYLENOL) 325 MG tablet Take 650 mg by mouth every 6 (six) hours as needed.     aspirin 81 MG EC tablet Chew 1 tablet by mouth daily.     azelastine (ASTELIN) 0.1 % nasal spray Place 2 sprays into both nostrils 2 (two) times daily as needed. 30 mL 5   AZO D-MANNOSE PO Take by mouth.     cabergoline (DOSTINEX) 0.5 MG tablet Take 0.25 mg by mouth 2 (two) times a week.     calcium citrate (CALCITRATE - DOSED IN MG ELEMENTAL CALCIUM) 950 (200 Ca) MG tablet Take 630 mg of elemental calcium by mouth daily.     celecoxib (CELEBREX) 200 MG capsule TAKE 1 CAPSULE BY MOUTH  EVERY DAY 30 capsule 0   cloNIDine (CATAPRES) 0.1 MG tablet Take 1 tablet (0.1 mg total) by mouth at bedtime. 60 tablet 0   docusate sodium (COLACE) 50 MG capsule Take 50 mg by mouth at bedtime.     fluticasone (FLONASE) 50 MCG/ACT nasal spray Place 1 spray into both nostrils daily as needed for allergies. 1 g 1   levothyroxine (SYNTHROID) 25 MCG tablet Take 25 mcg by mouth daily before breakfast.      losartan (COZAAR) 50 MG tablet Take 1 tablet (50 mg total) by mouth daily. 90 tablet 1   Magnesium 400 MG TABS Take 400 mg by mouth daily.     methocarbamol (ROBAXIN) 500 MG tablet Take 1 tablet (500 mg total) by mouth 4 (four) times daily. 360 tablet 1   metoprolol succinate (TOPROL-XL) 25 MG 24 hr tablet Take 1 tablet (25 mg total) by mouth daily. 90 tablet 1   montelukast (SINGULAIR) 10 MG tablet Take 1 tablet (10 mg total) by mouth at bedtime. 90 tablet 1   Multiple Vitamins-Minerals (HAIR  SKIN & NAILS ADVANCED PO) Take by mouth.     Omega-3 Fatty Acids (FISH OIL) 1000 MG CAPS Take 1 capsule by mouth in the morning and at bedtime.      ondansetron (ZOFRAN) 4 MG tablet Take 1 tablet (4 mg total) by mouth every 8 (eight) hours as needed for nausea or vomiting. 30 tablet 0   pantoprazole (PROTONIX) 40 MG tablet Take 1 tablet (40 mg total) by mouth daily. 90 tablet 1   polyethylene glycol (MIRALAX / GLYCOLAX) 17 g packet Take 17 g by mouth 2 (two) times daily. 28 packet 0   pravastatin (PRAVACHOL) 40 MG tablet TAKE 1 TABLET BY MOUTH EVERY DAY 90 tablet 1   pregabalin (LYRICA) 75 MG capsule Take 1 capsule (75 mg total) by mouth 2 (two) times daily. 60 capsule 2   QUEtiapine (SEROQUEL) 25 MG tablet TAKE 1 TABLET BY MOUTH EVERYDAY AT BEDTIME 90 tablet 2   No current facility-administered medications for this visit.    Allergies Amoxicillin-pot clavulanate and Bupropion   Social History Social History   Tobacco Use   Smoking status: Never    Passive exposure: Never   Smokeless tobacco: Never  Substance Use Topics   Alcohol use: No     Review of Systems Review of Systems  Constitutional:  Negative for chills, fever, malaise/fatigue and weight loss.  Eyes:  Negative for blurred vision and double vision.  Respiratory:  Negative for cough, hemoptysis, shortness of breath and wheezing.   Cardiovascular:  Negative for chest pain, palpitations and leg swelling.  Gastrointestinal:  Positive for nausea. Negative for abdominal pain, blood in stool, constipation, diarrhea, heartburn, melena and vomiting.  Genitourinary:  Negative for frequency and hematuria.  Musculoskeletal:  Positive for back pain and myalgias.  Skin:  Negative for itching and rash.  Neurological:  Negative for dizziness, weakness and headaches.  Endo/Heme/Allergies:  Negative for polydipsia.  Psychiatric/Behavioral:  Negative for depression. The patient is nervous/anxious and has insomnia.      Physical Exam:       Body mass index is 37.9 kg/m. BP 130/74   Pulse 71   Temp 98.6 F (37 C)   Resp 18   Ht 5\' 2"  (1.575 m)   Wt 207 lb 3.2 oz (94 kg)   SpO2 97%   BMI 37.90 kg/m   Physical Exam   Assessment:      Essential hypertension  Hypercholesterolemia - Plan: CBC with Differential/Platelet, CMP14 + Anion Gap, Hemoglobin A1c, TSH  Chronic pain syndrome - Plan: pregabalin (LYRICA) 75 MG capsule  Chronic low back pain, unspecified back pain laterality, unspecified whether sciatica present  Pituitary macroadenoma (HCC)  History of endometrial cancer  Fibromyalgia  GAD (generalized anxiety disorder)  Aortic atherosclerosis (HCC)  Primary osteoarthritis involving multiple joints  Osteoporosis without current pathological fracture, unspecified osteoporosis type  BMI 37.0-37.9, adult  Morbid obesity (HCC) - Plan: CBC with Differential/Platelet, CMP14 + Anion Gap, Hemoglobin A1c, Lipid panel  Hypothyroidism, unspecified type - Plan: TSH, T4, free    Plan:     During the course of the visit the patient was educated and counseled about appropriate screening and preventive services including:   Pneumococcal vaccine  Influenza vaccine Screening mammography Bone densitometry screening Colorectal cancer screening  Diet review for nutrition referral? Yes ____  Not Indicated ____   Patient Instructions (the written plan) was given to the patient.  No problem-specific Assessment & Plan notes found for this encounter.    Prevention   Medicare Attestation I have personally reviewed: The patient's medical and social history Their use of alcohol, tobacco or illicit drugs Their current medications and supplements The patient's functional ability including ADLs,fall risks, home safety risks, cognitive, and hearing and visual impairment Diet and physical activities Evidence for depression or mood disorders  The patient's weight, height, and BMI have been recorded in  the chart.  I have made referrals, counseling, and provided education to the patient based on review of the above and I have provided the patient with a written personalized care plan for preventive services.     Crist Fat, MD   05/19/2023

## 2023-05-20 LAB — CMP14 + ANION GAP
ALT: 26 [IU]/L (ref 0–32)
AST: 22 [IU]/L (ref 0–40)
Albumin: 4.3 g/dL (ref 3.8–4.8)
Alkaline Phosphatase: 128 [IU]/L — ABNORMAL HIGH (ref 44–121)
Anion Gap: 15 mmol/L (ref 10.0–18.0)
BUN/Creatinine Ratio: 24 (ref 12–28)
BUN: 23 mg/dL (ref 8–27)
Bilirubin Total: 0.3 mg/dL (ref 0.0–1.2)
CO2: 23 mmol/L (ref 20–29)
Calcium: 10 mg/dL (ref 8.7–10.3)
Chloride: 104 mmol/L (ref 96–106)
Creatinine, Ser: 0.97 mg/dL (ref 0.57–1.00)
Globulin, Total: 2.5 g/dL (ref 1.5–4.5)
Glucose: 93 mg/dL (ref 70–99)
Potassium: 4.9 mmol/L (ref 3.5–5.2)
Sodium: 142 mmol/L (ref 134–144)
Total Protein: 6.8 g/dL (ref 6.0–8.5)
eGFR: 61 mL/min/{1.73_m2} (ref >59)

## 2023-05-20 LAB — CBC WITH DIFFERENTIAL/PLATELET
Basophils Absolute: 0.1 10*3/uL (ref 0.0–0.2)
Basos: 1 %
EOS (ABSOLUTE): 0.3 10*3/uL (ref 0.0–0.4)
Eos: 3 %
Hematocrit: 44.5 % (ref 34.0–46.6)
Hemoglobin: 14.3 g/dL (ref 11.1–15.9)
Immature Grans (Abs): 0.1 10*3/uL (ref 0.0–0.1)
Immature Granulocytes: 1 %
Lymphocytes Absolute: 2.4 10*3/uL (ref 0.7–3.1)
Lymphs: 23 %
MCH: 29.3 pg (ref 26.6–33.0)
MCHC: 32.1 g/dL (ref 31.5–35.7)
MCV: 91 fL (ref 79–97)
Monocytes Absolute: 1.1 10*3/uL — ABNORMAL HIGH (ref 0.1–0.9)
Monocytes: 10 %
Neutrophils Absolute: 6.7 10*3/uL (ref 1.4–7.0)
Neutrophils: 62 %
Platelets: 302 10*3/uL (ref 150–450)
RBC: 4.88 x10E6/uL (ref 3.77–5.28)
RDW: 14.9 % (ref 11.7–15.4)
WBC: 10.6 10*3/uL (ref 3.4–10.8)

## 2023-05-20 LAB — LIPID PANEL
Chol/HDL Ratio: 4.1 {ratio} (ref 0.0–4.4)
Cholesterol, Total: 215 mg/dL — ABNORMAL HIGH (ref 100–199)
HDL: 53 mg/dL (ref >39)
LDL Chol Calc (NIH): 123 mg/dL — ABNORMAL HIGH (ref 0–99)
Triglycerides: 225 mg/dL — ABNORMAL HIGH (ref 0–149)
VLDL Cholesterol Cal: 39 mg/dL (ref 5–40)

## 2023-05-20 LAB — TSH: TSH: 1.15 u[IU]/mL (ref 0.450–4.500)

## 2023-05-20 LAB — HEMOGLOBIN A1C
Est. average glucose Bld gHb Est-mCnc: 126 mg/dL
Hgb A1c MFr Bld: 6 % — ABNORMAL HIGH (ref 4.8–5.6)

## 2023-05-20 LAB — T4, FREE: Free T4: 1.15 ng/dL (ref 0.82–1.77)

## 2023-05-27 ENCOUNTER — Other Ambulatory Visit: Payer: Self-pay

## 2023-05-27 DIAGNOSIS — E7800 Pure hypercholesterolemia, unspecified: Secondary | ICD-10-CM

## 2023-05-27 MED ORDER — PRAVASTATIN SODIUM 40 MG PO TABS: 40.0000 mg | ORAL_TABLET | Freq: Every evening | ORAL | 11 refills | Status: DC

## 2023-05-27 NOTE — Progress Notes (Signed)
She has prediabetes that is stable. Other labs look good. Cholesterol needs to be better controlled. Call her in pravastatin 80mg  at bedtime.  Patient is aware of labs and new orders for pravastatin

## 2023-05-27 NOTE — Progress Notes (Signed)
Rx change pravastatin 40- 80

## 2023-05-29 ENCOUNTER — Other Ambulatory Visit: Payer: Self-pay | Admitting: Internal Medicine

## 2023-05-29 DIAGNOSIS — L814 Other melanin hyperpigmentation: Secondary | ICD-10-CM | POA: Diagnosis not present

## 2023-05-29 DIAGNOSIS — L821 Other seborrheic keratosis: Secondary | ICD-10-CM | POA: Diagnosis not present

## 2023-05-29 DIAGNOSIS — D485 Neoplasm of uncertain behavior of skin: Secondary | ICD-10-CM | POA: Diagnosis not present

## 2023-05-29 DIAGNOSIS — D2239 Melanocytic nevi of other parts of face: Secondary | ICD-10-CM | POA: Diagnosis not present

## 2023-05-29 DIAGNOSIS — L570 Actinic keratosis: Secondary | ICD-10-CM | POA: Diagnosis not present

## 2023-05-29 DIAGNOSIS — D225 Melanocytic nevi of trunk: Secondary | ICD-10-CM | POA: Diagnosis not present

## 2023-06-01 ENCOUNTER — Encounter: Payer: Self-pay | Admitting: Internal Medicine

## 2023-06-02 ENCOUNTER — Other Ambulatory Visit: Payer: Self-pay

## 2023-06-02 DIAGNOSIS — E7800 Pure hypercholesterolemia, unspecified: Secondary | ICD-10-CM

## 2023-06-02 MED ORDER — PRAVASTATIN SODIUM 80 MG PO TABS: 80.0000 mg | ORAL_TABLET | Freq: Every evening | ORAL | 11 refills | Status: DC

## 2023-06-02 NOTE — Progress Notes (Signed)
 Rx refill

## 2023-06-02 NOTE — Progress Notes (Signed)
 Medication change

## 2023-06-04 DIAGNOSIS — G4700 Insomnia, unspecified: Secondary | ICD-10-CM | POA: Diagnosis not present

## 2023-06-04 DIAGNOSIS — Z7982 Long term (current) use of aspirin: Secondary | ICD-10-CM | POA: Diagnosis not present

## 2023-06-04 DIAGNOSIS — I129 Hypertensive chronic kidney disease with stage 1 through stage 4 chronic kidney disease, or unspecified chronic kidney disease: Secondary | ICD-10-CM | POA: Diagnosis not present

## 2023-06-04 DIAGNOSIS — K219 Gastro-esophageal reflux disease without esophagitis: Secondary | ICD-10-CM | POA: Diagnosis not present

## 2023-06-04 DIAGNOSIS — E221 Hyperprolactinemia: Secondary | ICD-10-CM | POA: Diagnosis not present

## 2023-06-04 DIAGNOSIS — M1990 Unspecified osteoarthritis, unspecified site: Secondary | ICD-10-CM | POA: Diagnosis not present

## 2023-06-04 DIAGNOSIS — E039 Hypothyroidism, unspecified: Secondary | ICD-10-CM | POA: Diagnosis not present

## 2023-06-04 DIAGNOSIS — Z8249 Family history of ischemic heart disease and other diseases of the circulatory system: Secondary | ICD-10-CM | POA: Diagnosis not present

## 2023-06-04 DIAGNOSIS — F321 Major depressive disorder, single episode, moderate: Secondary | ICD-10-CM | POA: Diagnosis not present

## 2023-06-04 DIAGNOSIS — M810 Age-related osteoporosis without current pathological fracture: Secondary | ICD-10-CM | POA: Diagnosis not present

## 2023-06-04 DIAGNOSIS — E785 Hyperlipidemia, unspecified: Secondary | ICD-10-CM | POA: Diagnosis not present

## 2023-06-16 ENCOUNTER — Other Ambulatory Visit: Payer: Self-pay | Admitting: Internal Medicine

## 2023-06-17 ENCOUNTER — Other Ambulatory Visit: Payer: Self-pay | Admitting: Internal Medicine

## 2023-06-18 ENCOUNTER — Other Ambulatory Visit: Payer: Self-pay | Admitting: Internal Medicine

## 2023-07-03 ENCOUNTER — Other Ambulatory Visit: Payer: Self-pay | Admitting: Internal Medicine

## 2023-07-14 ENCOUNTER — Other Ambulatory Visit: Payer: Self-pay | Admitting: Internal Medicine

## 2023-07-17 ENCOUNTER — Other Ambulatory Visit: Payer: Self-pay | Admitting: Internal Medicine

## 2023-07-19 ENCOUNTER — Other Ambulatory Visit: Payer: Self-pay | Admitting: Internal Medicine

## 2023-07-21 ENCOUNTER — Ambulatory Visit: Admitting: Internal Medicine

## 2023-07-21 ENCOUNTER — Encounter: Payer: Self-pay | Admitting: Internal Medicine

## 2023-07-21 ENCOUNTER — Other Ambulatory Visit: Payer: Self-pay | Admitting: Internal Medicine

## 2023-07-21 VITALS — BP 132/78 | HR 77 | Temp 97.7°F | Resp 18 | Ht 62.0 in | Wt 216.0 lb

## 2023-07-21 DIAGNOSIS — I10 Essential (primary) hypertension: Secondary | ICD-10-CM

## 2023-07-21 DIAGNOSIS — N390 Urinary tract infection, site not specified: Secondary | ICD-10-CM

## 2023-07-21 LAB — POCT URINALYSIS DIPSTICK
Bilirubin, UA: NEGATIVE
Glucose, UA: NEGATIVE
Ketones, UA: NEGATIVE
Nitrite, UA: POSITIVE
Protein, UA: POSITIVE — AB
Spec Grav, UA: 1.015 (ref 1.010–1.025)
Urobilinogen, UA: 0.2 U/dL
pH, UA: 6 (ref 5.0–8.0)

## 2023-07-21 MED ORDER — CEFUROXIME AXETIL 250 MG PO TABS: 250.0000 mg | ORAL_TABLET | Freq: Two times a day (BID) | ORAL | 0 refills | Status: AC

## 2023-07-21 NOTE — Assessment & Plan Note (Signed)
 She has had recurrent UTI in the past.  Her UA shows a UTI.  We are definitely sending this out for a urine culture.  I am going to place her on empiric ceftin 250mg  BID x 7 days.

## 2023-07-21 NOTE — Progress Notes (Signed)
 Office Visit  Subjective   [name]   DOB: 1945-07-05   Age: 78 y.o.   MRN: [id number]   Chief Complaint Chief Complaint  Patient presents with   office visit    Patient here for possible UTI since 2 weeks      History of Present Illness Kimberly Larsen is a 78 yo female who comes in today for an acute visit for possible UTI.  She states that over the last 2 weeks, she has had cloudy urine and lower abdominal cramping.  There is some low back pain but no fevers, chills, nausea, vomiting, dysuria, urinary frequency, hematuria or other problems.  She has been taking D-mannose for her UTI.   The patient did see Infectious Disease this past year due to recurrent UTI's.  She last saw them in 08/2022 where she has had about 7 UTI's since 12/2021 and until 07/2022.  She had a home test kit that kept showing she was positive.  Her PCP at [facility] was treating it at that time.  She has seen both urology and infectious disease where she saw ID in 08/2022.  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  They did a trial of methamine.  She has a history of kidney stones but no recent problems with this.  She also comes in today to do a release for activity to go back to the [facility] for exercise.  She used to exercise regularly at the gym up until 09/2022 where her gym closed.  The patient also was followed by cardiology as she initially had SOB back in 2022.  She has been seen by Dr. Kirtland Bouchard and his office with last visit in 02/2023 where she has a past medical history of aortic atherosclerosis noted on CT imaging and hypertension.  She has had an ECHO done on 11/26/2020 showing an EF 60 to 65%, moderate concentric LVH, grade 1 DD, trivial MR, mild aortic valve sclerosis without stenosis.  She had a lexiscan nuclear stress test done on  08/26/2019 and this was normal, low risk study.  Dr. Kirtland Bouchard felt her  SOB was due to deconditioning.      Past Medical History Past Medical History:  Diagnosis Date   Acute postoperative respiratory failure (HCC) 03/25/2014   Anesthesia of skin    Anxiety    Aortic atherosclerosis (HCC)    Arthritis    Back pain    Benign essential HTN 03/25/2014   Cancer (HCC)    endometrial cancer   Chronic daily headache 10/23/2017   Chronic low back pain 07/30/2017   Chronic pain syndrome 02/23/2014   DDD (degenerative disc disease), lumbar    Depression    Endometrial cancer (HCC) 01/10/2016   Fibromyalgia 09/08/2012   Gastroparesis    Genetic testing 12/17/2017   TumorNext [facility] + CancerNext was ordered. The CancerNext gene panel offered by [employer] includes sequencing and rearrangement analysis for the following 32 genes:   APC, ATM, BARD1, BMPR1A, BRCA1, BRCA2, BRIP1, CDH1, CDK4, CDKN2A, CHEK2, DICER1, EPCAM, GREM1, HOXB13MLH1, MRE11A, MSH2, MSH6, MUTYH, NBN, NF1, PALB2, PMS2, POLD1, POLE, PTEN, RAD50, RAD51D, SMAD4, SMARCA4, STK11, and TP53.   Ger   History of kidney stones    Hyperlipidemia 03/25/2014   Insomnia 09/08/2012   Long-term current use of opiate analgesic 05/28/2017   Lumbar post-laminectomy syndrome 07/30/2017   Near  syncope 07/10/2017   NEPHROLITHIASIS 06/26/2006   Qualifier: Diagnosis of  By: Bebe Shaggy     Obese 11/11/2018   Obesity (BMI 30-39.9) 09/02/2012   Obstructive uropathy 03/25/2014   Osteoporosis    Prolactinoma (HCC) 07/16/2017   S/P left TKA 11/10/2018   S/P right TKA 08/31/2012   UTI (urinary tract infection)      Allergies Allergies  Allergen Reactions   Amoxicillin-Pot Clavulanate Nausea Only    Other reaction(s): stomach upset   Bupropion Nausea Only    Other reaction(s): stomach upset     Medications  Current Outpatient Medications:    cefUROXime (CEFTIN) 250 MG tablet, Take 1 tablet (250 mg total) by mouth 2 (two) times daily with a meal for 7 days., Disp: 14 tablet, Rfl: 0   acetaminophen  (TYLENOL) 325 MG tablet, Take 650 mg by mouth every 6 (six) hours as needed., Disp: , Rfl:    aspirin 81 MG EC tablet, Chew 1 tablet by mouth daily., Disp: , Rfl:    azelastine (ASTELIN) 0.1 % nasal spray, Place 2 sprays into both nostrils 2 (two) times daily as needed., Disp: 30 mL, Rfl: 5   AZO D-MANNOSE PO, Take by mouth., Disp: , Rfl:    cabergoline (DOSTINEX) 0.5 MG tablet, Take 0.25 mg by mouth 2 (two) times a week., Disp: , Rfl:    calcium citrate (CALCITRATE - DOSED IN MG ELEMENTAL CALCIUM) 950 (200 Ca) MG tablet, Take 630 mg of elemental calcium by mouth daily., Disp: , Rfl:    celecoxib (CELEBREX) 200 MG capsule, TAKE 1 CAPSULE BY MOUTH EVERY DAY, Disp: 30 capsule, Rfl: 0   cloNIDine (CATAPRES) 0.1 MG tablet, Take 1 tablet (0.1 mg total) by mouth at bedtime., Disp: 60 tablet, Rfl: 0   docusate sodium (COLACE) 50 MG capsule, Take 50 mg by mouth at bedtime., Disp: , Rfl:    fluticasone (FLONASE) 50 MCG/ACT nasal spray, Place 1 spray into both nostrils daily as needed for allergies., Disp: 1 g, Rfl: 1   levothyroxine (SYNTHROID) 25 MCG tablet, Take 25 mcg by mouth daily before breakfast. , Disp: , Rfl:    losartan (COZAAR) 50 MG tablet, Take 1 tablet by mouth once daily, Disp: 90 tablet, Rfl: 0   Magnesium 400 MG TABS, Take 400 mg by mouth daily., Disp: , Rfl:    methocarbamol (ROBAXIN) 500 MG tablet, Take 1 tablet by mouth 4 times daily, Disp: 360 tablet, Rfl: 0   metoprolol succinate (TOPROL-XL) 25 MG 24 hr tablet, Take 1 tablet by mouth once daily, Disp: 90 tablet, Rfl: 0   montelukast (SINGULAIR) 10 MG tablet, TAKE 1 TABLET BY MOUTH EVERYDAY AT BEDTIME, Disp: 90 tablet, Rfl: 1   Multiple Vitamins-Minerals (HAIR SKIN & NAILS ADVANCED PO), Take by mouth., Disp: , Rfl:    Omega-3 Fatty Acids (FISH OIL) 1000 MG CAPS, Take 1 capsule by mouth in the morning and at bedtime. , Disp: , Rfl:    ondansetron (ZOFRAN) 4 MG tablet, Take 1 tablet (4 mg total) by mouth every 8 (eight) hours as needed  for nausea or vomiting., Disp: 30 tablet, Rfl: 0   pantoprazole (PROTONIX) 40 MG tablet, TAKE 1 TABLET BY MOUTH EVERY DAY, Disp: 90 tablet, Rfl: 1   polyethylene glycol (MIRALAX / GLYCOLAX) 17 g packet, Take 17 g by mouth 2 (two) times daily., Disp: 28 packet, Rfl: 0   pravastatin (PRAVACHOL) 80 MG tablet, Take 1 tablet (80 mg total) by mouth every evening., Disp: 30 tablet, Rfl:  11   pregabalin (LYRICA) 75 MG capsule, Take 1 capsule (75 mg total) by mouth 2 (two) times daily., Disp: 60 capsule, Rfl: 2   QUEtiapine (SEROQUEL) 100 MG tablet, Take 1 tablet (100 mg total) by mouth at bedtime., Disp: 90 tablet, Rfl: 1   QUEtiapine (SEROQUEL) 25 MG tablet, TAKE 1 TABLET BY MOUTH EVERYDAY AT BEDTIME, Disp: 90 tablet, Rfl: 2   Review of Systems Review of Systems  Constitutional:  Negative for chills, fever, malaise/fatigue and weight loss.  Respiratory:  Negative for cough and shortness of breath.   Cardiovascular:  Negative for chest pain, palpitations and leg swelling.  Gastrointestinal:  Negative for abdominal pain, constipation, diarrhea, heartburn, nausea and vomiting.  Musculoskeletal:  Negative for myalgias.  Skin:  Negative for itching and rash.  Neurological:  Negative for dizziness, weakness and headaches.       Objective:    Vitals BP 132/78 (BP Location: Left Arm, Patient Position: Sitting, Cuff Size: Normal)   Pulse 77   Temp 97.7 F (36.5 C)   Resp 18   Ht 5\' 2"  (1.575 m)   Wt 216 lb (98 kg)   SpO2 97%   BMI 39.51 kg/m    Physical Examination Physical Exam Constitutional:      Appearance: Normal appearance. She is not ill-appearing.  Cardiovascular:     Rate and Rhythm: Normal rate and regular rhythm.     Pulses: Normal pulses.     Heart sounds: No murmur heard.    No friction rub. No gallop.  Pulmonary:     Effort: Pulmonary effort is normal. No respiratory distress.     Breath sounds: No wheezing, rhonchi or rales.  Abdominal:     General: Bowel sounds are  normal. There is no distension.     Palpations: Abdomen is soft.     Tenderness: There is no abdominal tenderness.  Musculoskeletal:     Right lower leg: No edema.     Left lower leg: No edema.  Skin:    General: Skin is warm and dry.     Findings: No rash.  Neurological:     Mental Status: She is alert.        Assessment & Plan:   UTI (urinary tract infection) She has had recurrent UTI in the past.  Her UA shows a UTI.  We are definitely sending this out for a urine culture.  I am going to place her on empiric ceftin 250mg  BID x 7 days.    No follow-ups on file.   Kimberly Fat, MD

## 2023-07-25 LAB — URINE CULTURE

## 2023-08-13 ENCOUNTER — Encounter: Payer: Self-pay | Admitting: Internal Medicine

## 2023-08-13 ENCOUNTER — Ambulatory Visit: Payer: Medicare HMO | Admitting: Internal Medicine

## 2023-08-13 VITALS — BP 136/90 | HR 80 | Temp 98.1°F | Resp 17 | Ht 62.0 in | Wt 215.0 lb

## 2023-08-13 DIAGNOSIS — I10 Essential (primary) hypertension: Secondary | ICD-10-CM | POA: Diagnosis not present

## 2023-08-13 DIAGNOSIS — E7800 Pure hypercholesterolemia, unspecified: Secondary | ICD-10-CM

## 2023-08-13 DIAGNOSIS — R7303 Prediabetes: Secondary | ICD-10-CM | POA: Diagnosis not present

## 2023-08-13 NOTE — Assessment & Plan Note (Signed)
 We increase her pravastatin to 80mg  at bedtime.  We will recheck her FLP today.

## 2023-08-13 NOTE — Assessment & Plan Note (Signed)
 Her BP is under ok control.  Her cuff is about 10 points lower than ours.  We will continue her current BP meds.

## 2023-08-13 NOTE — Progress Notes (Signed)
 Office Visit  Subjective   [name]   DOB: Dec 01, 1945   Age: 78 y.o.   MRN: [id number]   Chief Complaint Chief Complaint  Patient presents with   Follow-up     History of Present Illness The patient was noted to be prediabetic on her yearly labs from 04/2023.  Her HgBa1c at that time was 6%.  We asked her to control this with diet and exercise.  She is currently not on any medications. She is going to the [facility] now and is using stationary bicycle and walking.  She specifically denies unexplained abdominal pain, nausea or vomiting. She does not routinely check blood sugars.  Her last HgBA1c was done 3 months ago and was 6%. She came in fasting today in anticipation of lab work.   The patient is a 78 year old female who presents for a follow-up evaluation of hypertension. Since her last visit, she has not had any problems.  The patient has been checking her blood pressure at home. The patient's blood pressure has ranged systollically 140-150's.  She did bring her BP cuff into the office today and it's about 10 points higher than our cuff.   The patient's current medications include: losartan 50mg  daily, Toprol XL 25mg  daily and she uses clonidine if needed if her SBP >170.  The patient has been tolerating her medications well. The patient denies any headache, visual changes, dizziness, lightheadness, chest pain, shortness of breath, weakness/numbness, and edema.  She reports there have been no other symptoms noted.    The patient also returns today for routine followup on her cholesterol. On her last visit 3 months ago, her cholesterol was not controlled and I asked her to increase her pravastatin from 40mg  to 80mg  at bedtime.  Overall, she states she is doing well and is without any complaints or problems at this time. She specifically denies abdominal pain, nausea, vomiting, diarrhea, myalgias, and fatigue. She remains on fish oil and pravastatin 80mg  at bedtime. She is fasting in  anticipation for labs today.      Past Medical History Past Medical History:  Diagnosis Date   Acute postoperative respiratory failure (HCC) 03/25/2014   Anesthesia of skin    Anxiety    Aortic atherosclerosis (HCC)    Arthritis    Back pain    Benign essential HTN 03/25/2014   Cancer (HCC)    endometrial cancer   Chronic daily headache 10/23/2017   Chronic low back pain 07/30/2017   Chronic pain syndrome 02/23/2014   DDD (degenerative disc disease), lumbar    Depression    Endometrial cancer (HCC) 01/10/2016   Fibromyalgia 09/08/2012   Gastroparesis    Genetic testing 12/17/2017   TumorNext [facility] + CancerNext was ordered. The CancerNext gene panel offered by [employer] includes sequencing and rearrangement analysis for the following 32 genes:   APC, ATM, BARD1, BMPR1A, BRCA1, BRCA2, BRIP1, CDH1, CDK4, CDKN2A, CHEK2, DICER1, EPCAM, GREM1, HOXB13MLH1, MRE11A, MSH2, MSH6, MUTYH, NBN, NF1, PALB2, PMS2, POLD1, POLE, PTEN, RAD50, RAD51D, SMAD4, SMARCA4, STK11, and TP53.   Ger   History of kidney stones    Hyperlipidemia 03/25/2014   Insomnia 09/08/2012   Long-term current use of opiate analgesic 05/28/2017   Lumbar post-laminectomy syndrome 07/30/2017   Near syncope 07/10/2017   NEPHROLITHIASIS 06/26/2006   Qualifier: Diagnosis of  By: Bebe Shaggy     Obese 11/11/2018   Obesity (BMI 30-39.9) 09/02/2012   Obstructive uropathy 03/25/2014   Osteoporosis  Prolactinoma (HCC) 07/16/2017   S/P left TKA 11/10/2018   S/P right TKA 08/31/2012   UTI (urinary tract infection)      Allergies Allergies  Allergen Reactions   Amoxicillin-Pot Clavulanate Nausea Only    Other reaction(s): stomach upset   Bupropion Nausea Only    Other reaction(s): stomach upset     Medications  Current Outpatient Medications:    acetaminophen (TYLENOL) 325 MG tablet, Take 650 mg by mouth every 6 (six) hours as needed., Disp: , Rfl:    aspirin 81 MG EC tablet, Chew 1 tablet by mouth  daily., Disp: , Rfl:    azelastine (ASTELIN) 0.1 % nasal spray, Place 2 sprays into both nostrils 2 (two) times daily as needed., Disp: 30 mL, Rfl: 5   AZO D-MANNOSE PO, Take by mouth., Disp: , Rfl:    cabergoline (DOSTINEX) 0.5 MG tablet, Take 0.25 mg by mouth 2 (two) times a week., Disp: , Rfl:    calcium citrate (CALCITRATE - DOSED IN MG ELEMENTAL CALCIUM) 950 (200 Ca) MG tablet, Take 630 mg of elemental calcium by mouth daily., Disp: , Rfl:    celecoxib (CELEBREX) 200 MG capsule, TAKE 1 CAPSULE BY MOUTH EVERY DAY, Disp: 30 capsule, Rfl: 0   cloNIDine (CATAPRES) 0.1 MG tablet, Take 1 tablet (0.1 mg total) by mouth at bedtime., Disp: 60 tablet, Rfl: 0   docusate sodium (COLACE) 50 MG capsule, Take 50 mg by mouth at bedtime., Disp: , Rfl:    fluticasone (FLONASE) 50 MCG/ACT nasal spray, Place 1 spray into both nostrils daily as needed for allergies., Disp: 1 g, Rfl: 1   levothyroxine (SYNTHROID) 25 MCG tablet, Take 25 mcg by mouth daily before breakfast. , Disp: , Rfl:    losartan (COZAAR) 50 MG tablet, Take 1 tablet by mouth once daily, Disp: 90 tablet, Rfl: 0   Magnesium 400 MG TABS, Take 400 mg by mouth daily., Disp: , Rfl:    methocarbamol (ROBAXIN) 500 MG tablet, Take 1 tablet by mouth 4 times daily, Disp: 360 tablet, Rfl: 0   metoprolol succinate (TOPROL-XL) 25 MG 24 hr tablet, Take 1 tablet by mouth once daily, Disp: 90 tablet, Rfl: 0   montelukast (SINGULAIR) 10 MG tablet, TAKE 1 TABLET BY MOUTH EVERYDAY AT BEDTIME, Disp: 90 tablet, Rfl: 1   Multiple Vitamins-Minerals (HAIR SKIN & NAILS ADVANCED PO), Take by mouth., Disp: , Rfl:    Omega-3 Fatty Acids (FISH OIL) 1000 MG CAPS, Take 1 capsule by mouth in the morning and at bedtime. , Disp: , Rfl:    ondansetron (ZOFRAN) 4 MG tablet, Take 1 tablet (4 mg total) by mouth every 8 (eight) hours as needed for nausea or vomiting., Disp: 30 tablet, Rfl: 0   pantoprazole (PROTONIX) 40 MG tablet, TAKE 1 TABLET BY MOUTH EVERY DAY, Disp: 90 tablet, Rfl:  1   polyethylene glycol (MIRALAX / GLYCOLAX) 17 g packet, Take 17 g by mouth 2 (two) times daily., Disp: 28 packet, Rfl: 0   pravastatin (PRAVACHOL) 80 MG tablet, Take 1 tablet (80 mg total) by mouth every evening., Disp: 30 tablet, Rfl: 11   pregabalin (LYRICA) 75 MG capsule, Take 1 capsule (75 mg total) by mouth 2 (two) times daily., Disp: 60 capsule, Rfl: 2   QUEtiapine (SEROQUEL) 100 MG tablet, Take 1 tablet (100 mg total) by mouth at bedtime., Disp: 90 tablet, Rfl: 1   QUEtiapine (SEROQUEL) 25 MG tablet, TAKE 1 TABLET BY MOUTH EVERYDAY AT BEDTIME, Disp: 90 tablet, Rfl: 2  Review of Systems Review of Systems  Constitutional:  Negative for chills, fever and malaise/fatigue.  Eyes:  Negative for blurred vision and double vision.  Respiratory:  Negative for cough and shortness of breath.   Cardiovascular:  Negative for chest pain and palpitations.  Gastrointestinal:  Negative for abdominal pain, constipation, diarrhea, nausea and vomiting.  Genitourinary:  Negative for frequency.  Musculoskeletal:  Negative for myalgias.  Skin:  Negative for itching and rash.  Neurological:  Negative for dizziness, weakness and headaches.  Endo/Heme/Allergies:  Negative for polydipsia.       Objective:    Vitals BP (!) 136/90   Pulse 80   Temp 98.1 F (36.7 C)   Resp 17   Ht 5\' 2"  (1.575 m)   Wt 215 lb (97.5 kg)   SpO2 96%   BMI 39.32 kg/m    Physical Examination Physical Exam Constitutional:      Appearance: Normal appearance. She is not ill-appearing.  Cardiovascular:     Rate and Rhythm: Normal rate and regular rhythm.     Pulses: Normal pulses.     Heart sounds: No murmur heard.    No friction rub. No gallop.  Pulmonary:     Effort: Pulmonary effort is normal. No respiratory distress.     Breath sounds: No wheezing, rhonchi or rales.  Abdominal:     General: Bowel sounds are normal. There is no distension.     Palpations: Abdomen is soft.     Tenderness: There is no abdominal  tenderness.  Musculoskeletal:     Right lower leg: No edema.     Left lower leg: No edema.  Skin:    General: Skin is warm and dry.     Findings: No rash.  Neurological:     General: No focal deficit present.     Mental Status: She is alert and oriented to person, place, and time.  Psychiatric:        Mood and Affect: Mood normal.        Behavior: Behavior normal.        Assessment & Plan:   Essential hypertension Her BP is under ok control.  Her cuff is about 10 points lower than ours.  We will continue her current BP meds.  Hypercholesterolemia We increase her pravastatin to 80mg  at bedtime.  We will recheck her FLP today.  Prediabetes She is exercising more and watching her diet.  We will recheck her A1c and her TFT's on her next visit.    Return in about 3 months (around 11/12/2023).   Wayne Haines, MD

## 2023-08-13 NOTE — Assessment & Plan Note (Signed)
 She is exercising more and watching her diet.  We will recheck her A1c and her TFT's on her next visit.

## 2023-08-15 ENCOUNTER — Ambulatory Visit: Payer: Medicare HMO | Admitting: Internal Medicine

## 2023-08-18 LAB — CMP14 + ANION GAP
ALT: 27 IU/L (ref 0–32)
AST: 29 IU/L (ref 0–40)
Albumin: 4.4 g/dL (ref 3.8–4.8)
Alkaline Phosphatase: 119 IU/L (ref 44–121)
Anion Gap: 16 mmol/L (ref 10.0–18.0)
BUN/Creatinine Ratio: 26 (ref 12–28)
BUN: 23 mg/dL (ref 8–27)
Bilirubin Total: 0.4 mg/dL (ref 0.0–1.2)
CO2: 23 mmol/L (ref 20–29)
Calcium: 10.2 mg/dL (ref 8.7–10.3)
Chloride: 104 mmol/L (ref 96–106)
Creatinine, Ser: 0.9 mg/dL (ref 0.57–1.00)
Globulin, Total: 2.1 g/dL (ref 1.5–4.5)
Glucose: 93 mg/dL (ref 70–99)
Potassium: 4.7 mmol/L (ref 3.5–5.2)
Sodium: 143 mmol/L (ref 134–144)
Total Protein: 6.5 g/dL (ref 6.0–8.5)
eGFR: 67 mL/min/{1.73_m2} (ref >59)

## 2023-08-18 LAB — LIPID PANEL
Chol/HDL Ratio: 3.8 ratio (ref 0.0–4.4)
Cholesterol, Total: 188 mg/dL (ref 100–199)
HDL: 49 mg/dL (ref >39)
LDL Chol Calc (NIH): 101 mg/dL — ABNORMAL HIGH (ref 0–99)
Triglycerides: 225 mg/dL — ABNORMAL HIGH (ref 0–149)
VLDL Cholesterol Cal: 38 mg/dL (ref 5–40)

## 2023-08-19 ENCOUNTER — Other Ambulatory Visit: Payer: Self-pay | Admitting: Internal Medicine

## 2023-08-19 DIAGNOSIS — G894 Chronic pain syndrome: Secondary | ICD-10-CM

## 2023-08-21 ENCOUNTER — Other Ambulatory Visit: Payer: Self-pay | Admitting: Internal Medicine

## 2023-08-21 DIAGNOSIS — G894 Chronic pain syndrome: Secondary | ICD-10-CM

## 2023-08-21 MED ORDER — PREGABALIN 75 MG PO CAPS: 75.0000 mg | ORAL_CAPSULE | Freq: Two times a day (BID) | ORAL | 2 refills | Status: DC

## 2023-08-27 ENCOUNTER — Other Ambulatory Visit: Payer: Self-pay

## 2023-08-27 MED ORDER — ATORVASTATIN CALCIUM 20 MG PO TABS: 20.0000 mg | ORAL_TABLET | Freq: Every day | ORAL | 11 refills | Status: DC

## 2023-08-27 NOTE — Progress Notes (Signed)
 We should do a little bit tighter control on her cholesterol. Change pravastatin  to lipitor 20mg  at bedtime. Other labs look good.  Patient aware of labs

## 2023-08-27 NOTE — Progress Notes (Signed)
 New Rx

## 2023-09-18 DIAGNOSIS — M545 Low back pain, unspecified: Secondary | ICD-10-CM | POA: Diagnosis not present

## 2023-09-18 DIAGNOSIS — M47816 Spondylosis without myelopathy or radiculopathy, lumbar region: Secondary | ICD-10-CM | POA: Diagnosis not present

## 2023-09-18 DIAGNOSIS — G8929 Other chronic pain: Secondary | ICD-10-CM | POA: Diagnosis not present

## 2023-09-24 DIAGNOSIS — N200 Calculus of kidney: Secondary | ICD-10-CM | POA: Diagnosis not present

## 2023-10-15 ENCOUNTER — Other Ambulatory Visit: Payer: Self-pay

## 2023-10-16 ENCOUNTER — Other Ambulatory Visit: Payer: Self-pay

## 2023-10-19 ENCOUNTER — Other Ambulatory Visit: Payer: Self-pay | Admitting: Internal Medicine

## 2023-10-19 DIAGNOSIS — I10 Essential (primary) hypertension: Secondary | ICD-10-CM

## 2023-10-23 ENCOUNTER — Ambulatory Visit (HOSPITAL_COMMUNITY): Admission: RE | Admit: 2023-10-23 | Source: Ambulatory Visit

## 2023-10-27 ENCOUNTER — Inpatient Hospital Stay: Payer: Medicare HMO | Admitting: Internal Medicine

## 2023-10-27 ENCOUNTER — Other Ambulatory Visit: Payer: Self-pay | Admitting: Internal Medicine

## 2023-10-27 DIAGNOSIS — D352 Benign neoplasm of pituitary gland: Secondary | ICD-10-CM

## 2023-11-04 ENCOUNTER — Telehealth: Payer: Self-pay | Admitting: Hematology

## 2023-11-04 NOTE — Telephone Encounter (Signed)
 Scheduled appointment per staff message. Talked with the patient and she is aware of the made appointment.

## 2023-11-11 ENCOUNTER — Other Ambulatory Visit: Payer: Self-pay | Admitting: Internal Medicine

## 2023-11-12 ENCOUNTER — Encounter: Payer: Self-pay | Admitting: Internal Medicine

## 2023-11-12 ENCOUNTER — Ambulatory Visit: Admitting: Internal Medicine

## 2023-11-12 VITALS — BP 130/78 | HR 80 | Temp 98.2°F | Resp 17 | Ht 62.0 in | Wt 217.0 lb

## 2023-11-12 DIAGNOSIS — E039 Hypothyroidism, unspecified: Secondary | ICD-10-CM | POA: Diagnosis not present

## 2023-11-12 DIAGNOSIS — R7303 Prediabetes: Secondary | ICD-10-CM

## 2023-11-12 DIAGNOSIS — E7800 Pure hypercholesterolemia, unspecified: Secondary | ICD-10-CM | POA: Diagnosis not present

## 2023-11-12 DIAGNOSIS — N390 Urinary tract infection, site not specified: Secondary | ICD-10-CM | POA: Diagnosis not present

## 2023-11-12 LAB — POCT URINALYSIS DIPSTICK (MANUAL)
Nitrite, UA: POSITIVE — AB
Poct Bilirubin: NEGATIVE
Poct Glucose: NORMAL mg/dL
Poct Ketones: NEGATIVE
Poct Protein: 30 mg/dL — AB
Poct Urobilinogen: NORMAL mg/dL
Spec Grav, UA: 1.02 (ref 1.010–1.025)
pH, UA: 6.5 (ref 5.0–8.0)

## 2023-11-12 MED ORDER — METHOCARBAMOL 500 MG PO TABS: 500.0000 mg | ORAL_TABLET | Freq: Four times a day (QID) | ORAL | 0 refills | Status: DC

## 2023-11-12 MED ORDER — CIPROFLOXACIN HCL 500 MG PO TABS: 500.0000 mg | ORAL_TABLET | Freq: Two times a day (BID) | ORAL | 0 refills | Status: AC

## 2023-11-12 NOTE — Addendum Note (Signed)
 Addended by: HAVEN SAILORS on: 11/12/2023 01:23 PM   Modules accepted: Orders

## 2023-11-12 NOTE — Assessment & Plan Note (Signed)
 We will check her TFT's today.  She seems euthyroid at this time.

## 2023-11-12 NOTE — Assessment & Plan Note (Signed)
 She has a UTI.  We will send for culture and start empiric cipro .

## 2023-11-12 NOTE — Assessment & Plan Note (Signed)
 We will check a HgBa1c with goal <7%.

## 2023-11-12 NOTE — Assessment & Plan Note (Signed)
 Her pravastatin  was change to lipitor.  We will check a FLP today,.

## 2023-11-12 NOTE — Progress Notes (Signed)
 Office Visit  Subjective   [name]   DOB: 11-27-1945   Age: 78 y.o.   MRN: [id number]   Chief Complaint Chief Complaint  Patient presents with   Follow-up     History of Present Illness The patient was noted to be prediabetic on her yearly labs from 04/2023.  Her HgBa1c at that time was 6%.  We asked her to control this with diet and exercise.  She is currently not on any medications. She is going to the [facility] now and is using stationary bicycle and walking.  She specifically denies unexplained abdominal pain, nausea or vomiting. She does not routinely check blood sugars.  Her last HgBA1c was done 3 months ago and was 6%. She came in fasting today in anticipation of lab work.   The patient also returns today for routine followup on her cholesterol. On her last visit, her cholesterol was not controlled and we changed her pravastatin  to lipitor 20mg  at bedtime.    Overall, she states she is doing well and is without any complaints or problems at this time. She specifically denies abdominal pain, nausea, vomiting, diarrhea, myalgias, and fatigue. She remains on fish oil  and lipitor 20mg  at bedtime. She is fasting in anticipation for labs today.   The patient is a 78 year old female who returns for a regularly scheduled thyroid  check. Since the last visit, there has been no overall change in her status. She is on levothryoxine 25mcg po daily.. She claims to have no symptoms suggestive of thyroid  imbalance specifically denying fatigue, cold intolerance, heat intolerance, tremors, anxiety, unexplained weight changes, and insomnia.   She also comes in with complaints of cloudy urine.  She denies any fevers, chills, abdominal/flank pain, nausea, vomting, dysuria, hematuria, or urinary frequency. She has not tried anything for her urine.  The patient did see Infectious Disease in 2024 due to recurrent UTI's.  She last saw them in 08/2022 where she has had about 7 UTI's since 12/2021 and until  07/2022.  She had a home test kit that kept showing she was positive.  Her PCP at [facility] was treating it at that time.  She has seen both urology and infectious disease where she saw ID in 08/2022.  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  They did a trial of methamine.  She has a history of kidney stones but no recent problems with this.       Past Medical History Past Medical History:  Diagnosis Date   Acute postoperative respiratory failure (HCC) 03/25/2014   Anesthesia of skin    Anxiety    Aortic atherosclerosis (HCC)    Arthritis    Back pain    Benign essential HTN 03/25/2014   Cancer (HCC)    endometrial cancer   Chronic daily headache 10/23/2017   Chronic low back pain 07/30/2017   Chronic pain syndrome 02/23/2014   DDD (degenerative disc disease), lumbar    Depression    Endometrial cancer (HCC) 01/10/2016   Fibromyalgia 09/08/2012   Gastroparesis    Genetic testing 12/17/2017   TumorNext [facility] + CancerNext was ordered. The CancerNext gene panel offered by [employer] includes sequencing and rearrangement analysis for the following 32 genes:   APC, ATM, BARD1, BMPR1A, BRCA1, BRCA2, BRIP1, CDH1, CDK4, CDKN2A, CHEK2, DICER1, EPCAM, GREM1, HOXB13MLH1, MRE11A, MSH2, MSH6, MUTYH, NBN,  NF1, PALB2, PMS2, POLD1, POLE, PTEN, RAD50, RAD51D, SMAD4, SMARCA4, STK11, and TP53.   Ger   History of kidney stones    Hyperlipidemia 03/25/2014   Insomnia 09/08/2012   Long-term current use of opiate analgesic 05/28/2017   Lumbar post-laminectomy syndrome 07/30/2017   Near syncope 07/10/2017   NEPHROLITHIASIS 06/26/2006   Qualifier: Diagnosis of  By: JANEY PARODY     Obese 11/11/2018   Obesity (BMI 30-39.9) 09/02/2012   Obstructive uropathy 03/25/2014   Osteoporosis    Prolactinoma (HCC) 07/16/2017   S/P left TKA 11/10/2018   S/P right TKA 08/31/2012   UTI  (urinary tract infection)      Allergies Allergies  Allergen Reactions   Amoxicillin -Pot Clavulanate Nausea Only    Other reaction(s): stomach upset   Bupropion Nausea Only    Other reaction(s): stomach upset     Medications  Current Outpatient Medications:    acetaminophen  (TYLENOL ) 325 MG tablet, Take 650 mg by mouth every 6 (six) hours as needed., Disp: , Rfl:    aspirin  81 MG EC tablet, Chew 1 tablet by mouth daily., Disp: , Rfl:    atorvastatin  (LIPITOR) 20 MG tablet, Take 1 tablet (20 mg total) by mouth at bedtime., Disp: 30 tablet, Rfl: 11   azelastine  (ASTELIN ) 0.1 % nasal spray, Place 2 sprays into both nostrils 2 (two) times daily as needed., Disp: 30 mL, Rfl: 5   AZO D-MANNOSE PO, Take by mouth., Disp: , Rfl:    cabergoline  (DOSTINEX ) 0.5 MG tablet, Take 0.25 mg by mouth 2 (two) times a week., Disp: , Rfl:    calcium  citrate (CALCITRATE - DOSED IN MG ELEMENTAL CALCIUM ) 950 (200 Ca) MG tablet, Take 630 mg of elemental calcium  by mouth daily., Disp: , Rfl:    celecoxib  (CELEBREX ) 200 MG capsule, TAKE 1 CAPSULE BY MOUTH EVERY DAY, Disp: 30 capsule, Rfl: 0   cloNIDine  (CATAPRES ) 0.1 MG tablet, Take 1 tablet (0.1 mg total) by mouth at bedtime., Disp: 60 tablet, Rfl: 0   docusate sodium  (COLACE) 50 MG capsule, Take 50 mg by mouth at bedtime., Disp: , Rfl:    fluticasone  (FLONASE ) 50 MCG/ACT nasal spray, Place 1 spray into both nostrils daily as needed for allergies., Disp: 1 g, Rfl: 1   levothyroxine  (SYNTHROID ) 25 MCG tablet, Take 25 mcg by mouth daily before breakfast. , Disp: , Rfl:    losartan  (COZAAR ) 50 MG tablet, Take 1 tablet by mouth once daily, Disp: 90 tablet, Rfl: 0   Magnesium  400 MG TABS, Take 400 mg by mouth daily., Disp: , Rfl:    methocarbamol  (ROBAXIN ) 500 MG tablet, Take 1 tablet by mouth 4 times daily, Disp: 360 tablet, Rfl: 0   metoprolol  succinate (TOPROL -XL) 25 MG 24 hr tablet, Take 1 tablet by mouth once daily, Disp: 90 tablet, Rfl: 0   montelukast   (SINGULAIR ) 10 MG tablet, TAKE 1 TABLET BY MOUTH EVERYDAY AT BEDTIME, Disp: 90 tablet, Rfl: 1   Multiple Vitamins-Minerals (HAIR SKIN & NAILS ADVANCED PO), Take by mouth., Disp: , Rfl:    Omega-3 Fatty Acids (FISH OIL ) 1000 MG CAPS, Take 1 capsule by mouth in the morning and at bedtime. , Disp: , Rfl:    ondansetron  (ZOFRAN ) 4 MG tablet, Take 1 tablet (4 mg total) by mouth every 8 (eight) hours as needed for nausea or vomiting., Disp: 30 tablet, Rfl: 0   pantoprazole  (PROTONIX ) 40 MG tablet, TAKE 1 TABLET BY MOUTH EVERY DAY, Disp: 90 tablet, Rfl: 1   polyethylene glycol (  MIRALAX  / GLYCOLAX ) 17 g packet, Take 17 g by mouth 2 (two) times daily., Disp: 28 packet, Rfl: 0   pregabalin  (LYRICA ) 75 MG capsule, Take 1 capsule (75 mg total) by mouth 2 (two) times daily., Disp: 60 capsule, Rfl: 2   QUEtiapine  (SEROQUEL ) 100 MG tablet, Take 1 tablet (100 mg total) by mouth at bedtime., Disp: 90 tablet, Rfl: 1   QUEtiapine  (SEROQUEL ) 25 MG tablet, TAKE 1 TABLET BY MOUTH EVERYDAY AT BEDTIME, Disp: 90 tablet, Rfl: 2   Review of Systems Review of Systems  Constitutional:  Negative for chills, fever and malaise/fatigue.  Eyes:  Negative for blurred vision and double vision.  Respiratory:  Negative for cough and shortness of breath.   Cardiovascular:  Negative for chest pain, palpitations and leg swelling.  Gastrointestinal:  Negative for abdominal pain, constipation, diarrhea, nausea and vomiting.  Genitourinary:  Negative for dysuria, flank pain, frequency, hematuria and urgency.  Musculoskeletal:  Negative for myalgias.  Skin:  Negative for itching and rash.  Neurological:  Negative for dizziness, weakness and headaches.       Objective:    Vitals BP 130/78   Pulse 80   Temp 98.2 F (36.8 C)   Resp 17   Ht 5' 2 (1.575 m)   Wt 217 lb (98.4 kg)   SpO2 98%   BMI 39.69 kg/m    Physical Examination Physical Exam Constitutional:      Appearance: Normal appearance. She is not ill-appearing.   Cardiovascular:     Rate and Rhythm: Normal rate and regular rhythm.     Pulses: Normal pulses.     Heart sounds: No murmur heard.    No friction rub. No gallop.  Pulmonary:     Effort: Pulmonary effort is normal. No respiratory distress.     Breath sounds: No wheezing, rhonchi or rales.  Abdominal:     General: Bowel sounds are normal. There is no distension.     Palpations: Abdomen is soft.     Tenderness: There is no abdominal tenderness.  Musculoskeletal:     Right lower leg: No edema.     Left lower leg: No edema.  Skin:    General: Skin is warm and dry.     Findings: No rash.  Neurological:     General: No focal deficit present.     Mental Status: She is alert and oriented to person, place, and time.  Psychiatric:        Mood and Affect: Mood normal.        Behavior: Behavior normal.        Assessment & Plan:   Hypothyroidism We will check her TFT's today.  She seems euthyroid at this time.  Hypercholesterolemia Her pravastatin  was change to lipitor.  We will check a FLP today,.  Prediabetes We will check a HgBa1c with goal <7%.  UTI (urinary tract infection) She has a UTI.  We will send for culture and start empiric cipro .    Return in about 3 months (around 02/12/2024).   Selinda Fleeta Finger, MD

## 2023-11-13 ENCOUNTER — Other Ambulatory Visit: Payer: Self-pay | Admitting: Internal Medicine

## 2023-11-13 LAB — CMP14 + ANION GAP
ALT: 33 IU/L — ABNORMAL HIGH (ref 0–32)
AST: 28 IU/L (ref 0–40)
Albumin: 4.2 g/dL (ref 3.8–4.8)
Alkaline Phosphatase: 120 IU/L (ref 44–121)
Anion Gap: 18 mmol/L (ref 10.0–18.0)
BUN/Creatinine Ratio: 21 (ref 12–28)
BUN: 18 mg/dL (ref 8–27)
Bilirubin Total: 0.6 mg/dL (ref 0.0–1.2)
CO2: 22 mmol/L (ref 20–29)
Calcium: 9.8 mg/dL (ref 8.7–10.3)
Chloride: 103 mmol/L (ref 96–106)
Creatinine, Ser: 0.87 mg/dL (ref 0.57–1.00)
Globulin, Total: 2.5 g/dL (ref 1.5–4.5)
Glucose: 108 mg/dL — ABNORMAL HIGH (ref 70–99)
Potassium: 4.5 mmol/L (ref 3.5–5.2)
Sodium: 143 mmol/L (ref 134–144)
Total Protein: 6.7 g/dL (ref 6.0–8.5)
eGFR: 69 mL/min/1.73 (ref >59)

## 2023-11-13 LAB — LIPID PANEL
Chol/HDL Ratio: 3.8 ratio (ref 0.0–4.4)
Cholesterol, Total: 165 mg/dL (ref 100–199)
HDL: 44 mg/dL (ref >39)
LDL Chol Calc (NIH): 81 mg/dL (ref 0–99)
Triglycerides: 240 mg/dL — ABNORMAL HIGH (ref 0–149)
VLDL Cholesterol Cal: 40 mg/dL (ref 5–40)

## 2023-11-13 LAB — HEMOGLOBIN A1C
Est. average glucose Bld gHb Est-mCnc: 128 mg/dL
Hgb A1c MFr Bld: 6.1 % — ABNORMAL HIGH (ref 4.8–5.6)

## 2023-11-13 LAB — T4, FREE: Free T4: 0.97 ng/dL (ref 0.82–1.77)

## 2023-11-13 LAB — TSH: TSH: 0.948 u[IU]/mL (ref 0.450–4.500)

## 2023-11-14 ENCOUNTER — Ambulatory Visit: Payer: Self-pay

## 2023-11-14 NOTE — Progress Notes (Signed)
 Her labs look good.  Pt aware of labs

## 2023-11-15 LAB — URINE CULTURE

## 2023-11-17 ENCOUNTER — Ambulatory Visit: Admitting: Internal Medicine

## 2023-11-17 ENCOUNTER — Encounter: Payer: Self-pay | Admitting: Internal Medicine

## 2023-11-17 VITALS — BP 138/84 | HR 78 | Temp 98.2°F | Resp 18 | Ht 62.0 in | Wt 208.0 lb

## 2023-11-17 DIAGNOSIS — M791 Myalgia, unspecified site: Secondary | ICD-10-CM | POA: Diagnosis not present

## 2023-11-17 MED ORDER — PREGABALIN 100 MG PO CAPS: 100.0000 mg | ORAL_CAPSULE | Freq: Two times a day (BID) | ORAL | 2 refills | Status: DC

## 2023-11-17 NOTE — Progress Notes (Signed)
 Office Visit  Subjective   [name]   DOB: 08-02-1945   Age: 78 y.o.   MRN: [id number]   Chief Complaint Chief Complaint  Patient presents with   Acute Visit    Inflammation     History of Present Illness Kimberly Larsen is a 78 yo female who comes in today with complaints of soreness of her muscles and lower legs.  This started after she fell in September 2024 where she states she has a dull aching pain that is continuous and involving her lower thighs and lower legs.  She does not have knee pain but it is muscle pain.  She was on celebrex  200mg  daily but she stopped this a few years ago as it was not helping.  She is on lyrica  75mg  BID and robaxin  500mg  every 6 hours.  The patient has a history of chronic pain syndrome where she has chronic low back pain and fibromyalgia.  She states she has had chronic low back pain since 2013.  She had a lumbar laminectomy and fusion in 2013.  She states she had a spinal stimulator placed in 2015 for her chronic pain but this did not help.  She was on percocet from 2013 until 2020.  She states the NP from [facility] put her on lyrica  in 07/2022 and she states this did help.  She has a history of lumbar DDD and DJD where she has had both of her knees replaced.  She also has arthritis in her hands.  The patient also has a history of fibromyalgia with pain with pain all over.  She had taken ibuprofen  and tylenol  together this morning but this did not help with her pain.  There is no new weakness/numbness of her lower legs and no loss of bowel/bladder function.     Past Medical History Past Medical History:  Diagnosis Date   Acute postoperative respiratory failure (HCC) 03/25/2014   Anesthesia of skin    Anxiety    Aortic atherosclerosis (HCC)    Arthritis    Back pain    Benign essential HTN 03/25/2014   Cancer (HCC)    endometrial cancer   Chronic daily headache 10/23/2017   Chronic low back pain 07/30/2017   Chronic pain syndrome  02/23/2014   DDD (degenerative disc disease), lumbar    Depression    Endometrial cancer (HCC) 01/10/2016   Fibromyalgia 09/08/2012   Gastroparesis    Genetic testing 12/17/2017   TumorNext [facility] + CancerNext was ordered. The CancerNext gene panel offered by [employer] includes sequencing and rearrangement analysis for the following 32 genes:   APC, ATM, BARD1, BMPR1A, BRCA1, BRCA2, BRIP1, CDH1, CDK4, CDKN2A, CHEK2, DICER1, EPCAM, GREM1, HOXB13MLH1, MRE11A, MSH2, MSH6, MUTYH, NBN, NF1, PALB2, PMS2, POLD1, POLE, PTEN, RAD50, RAD51D, SMAD4, SMARCA4, STK11, and TP53.   Ger   History of kidney stones    Hyperlipidemia 03/25/2014   Insomnia 09/08/2012   Long-term current use of opiate analgesic 05/28/2017   Lumbar post-laminectomy syndrome 07/30/2017   Near syncope 07/10/2017   NEPHROLITHIASIS 06/26/2006   Qualifier: Diagnosis of  By: JANEY PARODY     Obese 11/11/2018   Obesity (BMI 30-39.9) 09/02/2012   Obstructive uropathy 03/25/2014   Osteoporosis    Prolactinoma (HCC) 07/16/2017   S/P left TKA 11/10/2018   S/P right TKA 08/31/2012   UTI (urinary tract infection)      Allergies Allergies  Allergen Reactions   Amoxicillin -Pot Clavulanate Nausea Only    Other  reaction(s): stomach upset   Bupropion Nausea Only    Other reaction(s): stomach upset     Medications  Current Outpatient Medications:    acetaminophen  (TYLENOL ) 325 MG tablet, Take 650 mg by mouth every 6 (six) hours as needed., Disp: , Rfl:    aspirin  81 MG EC tablet, Chew 1 tablet by mouth daily., Disp: , Rfl:    atorvastatin  (LIPITOR) 20 MG tablet, Take 1 tablet (20 mg total) by mouth at bedtime., Disp: 30 tablet, Rfl: 11   azelastine  (ASTELIN ) 0.1 % nasal spray, Place 2 sprays into both nostrils 2 (two) times daily as needed., Disp: 30 mL, Rfl: 5   AZO D-MANNOSE PO, Take by mouth., Disp: , Rfl:    cabergoline  (DOSTINEX ) 0.5 MG tablet, Take 0.25 mg by mouth 2 (two) times a week., Disp: , Rfl:    calcium   citrate (CALCITRATE - DOSED IN MG ELEMENTAL CALCIUM ) 950 (200 Ca) MG tablet, Take 630 mg of elemental calcium  by mouth daily., Disp: , Rfl:    ciprofloxacin  (CIPRO ) 500 MG tablet, Take 1 tablet (500 mg total) by mouth 2 (two) times daily for 7 days., Disp: 14 tablet, Rfl: 0   cloNIDine  (CATAPRES ) 0.1 MG tablet, Take 1 tablet (0.1 mg total) by mouth at bedtime., Disp: 60 tablet, Rfl: 0   docusate sodium  (COLACE) 50 MG capsule, Take 50 mg by mouth at bedtime., Disp: , Rfl:    fluticasone  (FLONASE ) 50 MCG/ACT nasal spray, Place 1 spray into both nostrils daily as needed for allergies., Disp: 1 g, Rfl: 1   levothyroxine  (SYNTHROID ) 25 MCG tablet, Take 25 mcg by mouth daily before breakfast. , Disp: , Rfl:    losartan  (COZAAR ) 50 MG tablet, Take 1 tablet by mouth once daily, Disp: 90 tablet, Rfl: 0   Magnesium  400 MG TABS, Take 400 mg by mouth daily., Disp: , Rfl:    methocarbamol  (ROBAXIN ) 500 MG tablet, Take 1 tablet (500 mg total) by mouth 4 (four) times daily., Disp: 360 tablet, Rfl: 0   metoprolol  succinate (TOPROL -XL) 25 MG 24 hr tablet, Take 1 tablet by mouth once daily, Disp: 90 tablet, Rfl: 0   montelukast  (SINGULAIR ) 10 MG tablet, TAKE 1 TABLET BY MOUTH EVERYDAY AT BEDTIME, Disp: 90 tablet, Rfl: 1   Multiple Vitamins-Minerals (HAIR SKIN & NAILS ADVANCED PO), Take by mouth., Disp: , Rfl:    Omega-3 Fatty Acids (FISH OIL ) 1000 MG CAPS, Take 1 capsule by mouth in the morning and at bedtime. , Disp: , Rfl:    ondansetron  (ZOFRAN ) 4 MG tablet, Take 1 tablet (4 mg total) by mouth every 8 (eight) hours as needed for nausea or vomiting., Disp: 30 tablet, Rfl: 0   pantoprazole  (PROTONIX ) 40 MG tablet, TAKE 1 TABLET BY MOUTH EVERY DAY, Disp: 90 tablet, Rfl: 1   polyethylene glycol (MIRALAX  / GLYCOLAX ) 17 g packet, Take 17 g by mouth 2 (two) times daily., Disp: 28 packet, Rfl: 0   pregabalin  (LYRICA ) 75 MG capsule, Take 1 capsule (75 mg total) by mouth 2 (two) times daily., Disp: 60 capsule, Rfl: 2    QUEtiapine  (SEROQUEL ) 100 MG tablet, TAKE 1 TABLET BY MOUTH EVERYDAY AT BEDTIME, Disp: 90 tablet, Rfl: 1   QUEtiapine  (SEROQUEL ) 25 MG tablet, TAKE 1 TABLET BY MOUTH EVERYDAY AT BEDTIME, Disp: 90 tablet, Rfl: 2   Review of Systems Review of Systems  Constitutional:  Negative for chills and fever.  Eyes:  Negative for blurred vision.  Respiratory:  Negative for cough and shortness of  breath.   Cardiovascular:  Negative for chest pain, palpitations and leg swelling.  Gastrointestinal:  Negative for abdominal pain, constipation, diarrhea, nausea and vomiting.  Musculoskeletal:  Positive for back pain and myalgias. Negative for falls.  Skin:  Negative for rash.  Neurological:  Negative for dizziness, weakness and headaches.       Objective:    Vitals BP 138/84   Pulse 78   Temp 98.2 F (36.8 C)   Resp 18   Ht 5' 2 (1.575 m)   Wt 208 lb (94.3 kg)   SpO2 95%   BMI 38.04 kg/m    Physical Examination Physical Exam Constitutional:      Appearance: Normal appearance. She is not ill-appearing.  Cardiovascular:     Rate and Rhythm: Normal rate and regular rhythm.     Pulses: Normal pulses.     Heart sounds: No murmur heard.    No friction rub. No gallop.  Pulmonary:     Effort: Pulmonary effort is normal. No respiratory distress.     Breath sounds: No wheezing, rhonchi or rales.  Abdominal:     General: Bowel sounds are normal. There is no distension.     Palpations: Abdomen is soft.     Tenderness: There is no abdominal tenderness.  Musculoskeletal:     Right lower leg: No edema.     Left lower leg: No edema.     Comments: She has pain upon to palpation to her muscles and tendons around her knees.  Skin:    General: Skin is warm and dry.     Findings: No rash.  Neurological:     Mental Status: She is alert.        Assessment & Plan:   Myalgia I have asked her to just stop her lipitor for 2-3 weeks to see if this helps.  I am going to obtain some additional labs  since she had labs last week.  We will get a CK and ESR on her.  I am going to increase her dose of lyrica  from 75mg  BID to 100mg  BID.    No follow-ups on file.   Selinda Fleeta Finger, MD

## 2023-11-17 NOTE — Assessment & Plan Note (Signed)
 I have asked her to just stop her lipitor for 2-3 weeks to see if this helps.  I am going to obtain some additional labs since she had labs last week.  We will get a CK and ESR on her.  I am going to increase her dose of lyrica  from 75mg  BID to 100mg  BID.

## 2023-11-18 LAB — SEDIMENTATION RATE: Sed Rate: 22 mm/h (ref 0–40)

## 2023-11-18 LAB — CK: Total CK: 145 U/L (ref 32–182)

## 2023-11-20 ENCOUNTER — Inpatient Hospital Stay (HOSPITAL_BASED_OUTPATIENT_CLINIC_OR_DEPARTMENT_OTHER)
Admission: RE | Admit: 2023-11-20 | Discharge: 2023-11-20 | Disposition: A | Discharge status: Home / Self Care | Source: Ambulatory Visit | Attending: Internal Medicine | Admitting: Radiology

## 2023-11-20 DIAGNOSIS — D352 Benign neoplasm of pituitary gland: Secondary | ICD-10-CM

## 2023-11-20 DIAGNOSIS — R9082 White matter disease, unspecified: Secondary | ICD-10-CM | POA: Diagnosis not present

## 2023-11-20 MED ORDER — IOHEXOL 300 MG/ML  SOLN
75.0000 mL | Freq: Once | INTRAMUSCULAR | Status: AC | PRN
  Administered 2023-11-20: 75 mL via INTRAVENOUS

## 2023-11-24 ENCOUNTER — Telehealth: Payer: Self-pay | Admitting: *Deleted

## 2023-11-24 NOTE — Telephone Encounter (Signed)
 PC to patient, informed her we need to change her appointment with Dr Buckley to Tuesday, 12/02/23 at 10:30.  She verbalizes understanding.

## 2023-11-27 ENCOUNTER — Inpatient Hospital Stay: Admitting: Internal Medicine

## 2023-12-01 ENCOUNTER — Ambulatory Visit: Payer: Self-pay

## 2023-12-01 NOTE — Progress Notes (Signed)
 Patient called.  Patient aware.  I have called and informed the patient  Call and tell her that her labs for muscle aches were normal. .  Pt aware.

## 2023-12-02 ENCOUNTER — Inpatient Hospital Stay: Attending: Internal Medicine | Admitting: Internal Medicine

## 2023-12-02 VITALS — BP 149/67 | HR 78 | Temp 97.2°F | Resp 18 | Wt 218.3 lb

## 2023-12-02 DIAGNOSIS — D352 Benign neoplasm of pituitary gland: Secondary | ICD-10-CM | POA: Insufficient documentation

## 2023-12-02 NOTE — Progress Notes (Signed)
 [facility] [address]  [place], [place] [address] [phone]   Interval Evaluation  Date of Service: 12/02/23 Patient Name: Kimberly Larsen Patient MRN: [id number] Patient DOB: Jan 09, 1946 Provider: Arthea MARLA Manns, MD  Identifying Statement:  Kimberly Larsen is a 78 y.o. female with sellar macroadenoma   Interval History:  [name] presents today after recent CT head.  She continues to take cabergoline  through Dr. Faythe, now 6 years removed from initial diagnosis (2019).  No new or progressive symptoms today.  Denies headaches, seizures.    Medications: Current Outpatient Medications on File Prior to Visit  Medication Sig Dispense Refill   acetaminophen  (TYLENOL ) 325 MG tablet Take 650 mg by mouth every 6 (six) hours as needed.     aspirin  81 MG EC tablet Chew 1 tablet by mouth daily.     atorvastatin  (LIPITOR) 20 MG tablet Take 1 tablet (20 mg total) by mouth at bedtime. 30 tablet 11   azelastine  (ASTELIN ) 0.1 % nasal spray Place 2 sprays into both nostrils 2 (two) times daily as needed. 30 mL 5   AZO D-MANNOSE PO Take by mouth.     cabergoline  (DOSTINEX ) 0.5 MG tablet Take 0.25 mg by mouth 2 (two) times a week.     calcium  citrate (CALCITRATE - DOSED IN MG ELEMENTAL CALCIUM ) 950 (200 Ca) MG tablet Take 630 mg of elemental calcium  by mouth daily.     cloNIDine  (CATAPRES ) 0.1 MG tablet Take 1 tablet (0.1 mg total) by mouth at bedtime. 60 tablet 0   docusate sodium  (COLACE) 50 MG capsule Take 50 mg by mouth at bedtime.     fluticasone  (FLONASE ) 50 MCG/ACT nasal spray Place 1 spray into both nostrils daily as needed for allergies. 1 g 1   levothyroxine  (SYNTHROID ) 25 MCG tablet Take 25 mcg by mouth daily before breakfast.      losartan  (COZAAR ) 50 MG tablet Take 1 tablet by mouth once daily 90 tablet 0   Magnesium  400 MG TABS Take 400 mg by mouth daily.     methocarbamol  (ROBAXIN ) 500 MG tablet Take 1 tablet (500 mg total) by mouth 4 (four)  times daily. 360 tablet 0   metoprolol  succinate (TOPROL -XL) 25 MG 24 hr tablet Take 1 tablet by mouth once daily 90 tablet 0   montelukast  (SINGULAIR ) 10 MG tablet TAKE 1 TABLET BY MOUTH EVERYDAY AT BEDTIME 90 tablet 1   Multiple Vitamins-Minerals (HAIR SKIN & NAILS ADVANCED PO) Take by mouth.     Omega-3 Fatty Acids (FISH OIL ) 1000 MG CAPS Take 1 capsule by mouth in the morning and at bedtime.      ondansetron  (ZOFRAN ) 4 MG tablet Take 1 tablet (4 mg total) by mouth every 8 (eight) hours as needed for nausea or vomiting. 30 tablet 0   pantoprazole  (PROTONIX ) 40 MG tablet TAKE 1 TABLET BY MOUTH EVERY DAY 90 tablet 1   polyethylene glycol (MIRALAX  / GLYCOLAX ) 17 g packet Take 17 g by mouth 2 (two) times daily. 28 packet 0   pregabalin  (LYRICA ) 100 MG capsule Take 1 capsule (100 mg total) by mouth 2 (two) times daily. 60 capsule 2   QUEtiapine  (SEROQUEL ) 100 MG tablet TAKE 1 TABLET BY MOUTH EVERYDAY AT BEDTIME 90 tablet 1   QUEtiapine  (SEROQUEL ) 25 MG tablet TAKE 1 TABLET BY MOUTH EVERYDAY AT BEDTIME 90 tablet 2   No current facility-administered medications on file prior to visit.    Allergies:  Allergies  Allergen Reactions   Amoxicillin -Pot Clavulanate Nausea Only    Other reaction(s): stomach upset   Bupropion Nausea Only    Other reaction(s): stomach upset   Past Medical History:  Past Medical History:  Diagnosis Date   Acute postoperative respiratory failure (HCC) 03/25/2014   Anesthesia of skin    Anxiety    Aortic atherosclerosis (HCC)    Arthritis    Back pain    Benign essential HTN 03/25/2014   Cancer (HCC)    endometrial cancer   Chronic daily headache 10/23/2017   Chronic low back pain 07/30/2017   Chronic pain syndrome 02/23/2014   DDD (degenerative disc disease), lumbar    Depression    Endometrial cancer (HCC) 01/10/2016   Fibromyalgia 09/08/2012   Gastroparesis    Genetic testing 12/17/2017   TumorNext [facility] + CancerNext was ordered. The CancerNext gene  panel offered by [employer] includes sequencing and rearrangement analysis for the following 32 genes:   APC, ATM, BARD1, BMPR1A, BRCA1, BRCA2, BRIP1, CDH1, CDK4, CDKN2A, CHEK2, DICER1, EPCAM, GREM1, HOXB13MLH1, MRE11A, MSH2, MSH6, MUTYH, NBN, NF1, PALB2, PMS2, POLD1, POLE, PTEN, RAD50, RAD51D, SMAD4, SMARCA4, STK11, and TP53.   Ger   History of kidney stones    Hyperlipidemia 03/25/2014   Insomnia 09/08/2012   Long-term current use of opiate analgesic 05/28/2017   Lumbar post-laminectomy syndrome 07/30/2017   Near syncope 07/10/2017   NEPHROLITHIASIS 06/26/2006   Qualifier: Diagnosis of  By: JANEY PARODY     Obese 11/11/2018   Obesity (BMI 30-39.9) 09/02/2012   Obstructive uropathy 03/25/2014   Osteoporosis    Prolactinoma (HCC) 07/16/2017   S/P left TKA 11/10/2018   S/P right TKA 08/31/2012   UTI (urinary tract infection)    Past Surgical History:  Past Surgical History:  Procedure Laterality Date   BACK SURGERY  2013   CARPAL TUNNEL RELEASE     R hand   CYSTOSCOPY WITH URETEROSCOPY AND STENT PLACEMENT Bilateral 03/25/2014   Procedure: CYSTOSCOPY WITH URETEROSCOPY AND STENT PLACEMENT;  Surgeon: Gretel Ferrara, MD;  Location: [facility];  Service: Urology;  Laterality: Bilateral;   CYSTOSCOPY WITH URETEROSCOPY AND STENT PLACEMENT Bilateral 04/18/2014   Procedure: CYSTOSCOPY WITH URETEROSCOPY AND STENT PLACEMENT,RETROGRADE;  Surgeon: Gretel Ferrara, MD;  Location: [facility];  Service: Urology;  Laterality: Bilateral;   CYSTOSCOPY WITH URETEROSCOPY AND STENT PLACEMENT Right 05/30/2014   Procedure: CYSTOSCOPY WITH URETEROSCOPY AND STENT PLACEMENT;  Surgeon: Gretel Ferrara, MD;  Location: [facility];  Service: Urology;  Laterality: Right;   CYSTOSCOPY/RETROGRADE/URETEROSCOPY Left 05/30/2014   Procedure: LEFT RETROGRADE;  Surgeon: Gretel Ferrara, MD;  Location: [facility];  Service: Urology;  Laterality: Left;   CYSTOSCOPY/URETEROSCOPY/HOLMIUM LASER/STENT PLACEMENT Left 11/25/2019   Procedure:  CYSTOSCOPY/RETROGRADE/URETEROSCOPY/HOLMIUM LASER/STENT PLACEMENT;  Surgeon: Ferrara Gretel, MD;  Location: [facility];  Service: Urology;  Laterality: Left;   EYE SURGERY     cataract surgery bil   GANGLION CYST EXCISION     L ankle   HAMMERTOE RECONSTRUCTION WITH WEIL OSTEOTOMY Left 09/05/2016   Procedure: Second Metatarsal Weil Osteotomy and Hammertoe Correction;  Surgeon: Kit Rush, MD;  Location: [facility];  Service: Orthopedics;  Laterality: Left;   HOLMIUM LASER APPLICATION Bilateral 04/18/2014   Procedure: HOLMIUM LASER APPLICATION;  Surgeon: Gretel Ferrara, MD;  Location: [facility];  Service: Urology;  Laterality: Bilateral;   JOINT REPLACEMENT     total knee   LITHOTRIPSY     x 3   LUMBAR FUSION  09/2011   METATARSAL OSTEOTOMY WITH BUNIONECTOMY  Left 09/05/2016   Procedure: Left First Metatarsal Scarf Osteotomy, Modified McBride Bunion Correction;  Surgeon: Kit Rush, MD;  Location: [facility];  Service: Orthopedics;  Laterality: Left;   RHINOPLASTY     ROBOTIC ASSISTED TOTAL HYSTERECTOMY WITH BILATERAL SALPINGO OOPHERECTOMY Bilateral 01/16/2016   Procedure: XI ROBOTIC ASSISTED TOTAL HYSTERECTOMY WITH BILATERAL SALPINGO OOPHORECTOMY AND BILATERAL PELVIC LYMPH NODE DISSECTION;  Surgeon: Maurilio Ship, MD;  Location: [facility];  Service: Gynecology;  Laterality: Bilateral;   SPINAL CORD STIMULATOR INSERTION N/A 02/23/2014   Procedure: SPINAL CORD STIMULATOR PLACEMENT ;  Surgeon: Donaciano Sprang, MD;  Location: [facility];  Service: Orthopedics;  Laterality: N/A;   TONSILLECTOMY     TOTAL KNEE ARTHROPLASTY Right 08/31/2012   Procedure: RIGHT TOTAL KNEE ARTHROPLASTY;  Surgeon: Donnice JONETTA Car, MD;  Location: [facility];  Service: Orthopedics;  Laterality: Right;  with LMA   TOTAL KNEE ARTHROPLASTY Left 11/10/2018   Procedure: TOTAL KNEE ARTHROPLASTY;  Surgeon: Car Donnice, MD;  Location: [facility];  Service: Orthopedics;  Laterality: Left;  70 mins   TUBAL LIGATION     Social  History:  Social History   Socioeconomic History   Marital status: Divorced    Spouse name: Not on file   Number of children: 1   Years of education: 12   Highest education level: Not on file  Occupational History   Not on file  Tobacco Use   Smoking status: Never    Passive exposure: Never   Smokeless tobacco: Never  Vaping Use   Vaping status: Never Used  Substance and Sexual Activity   Alcohol  use: No   Drug use: No   Sexual activity: Yes  Other Topics Concern   Not on file  Social History Narrative   Lives alone   Social Drivers of Health   Financial Resource Strain: Not on file  Food Insecurity: Not on file  Transportation Needs: Not on file  Physical Activity: Not on file  Stress: Not on file  Social Connections: Not on file  Intimate Partner Violence: Not on file   Family History:  Family History  Problem Relation Age of Onset   Stroke Mother    Emphysema Mother    COPD Mother    Hypertension Mother    Stroke Father    Heart disease Father    Emphysema Father    Hypertension Father    Heart disease Brother     Review of Systems: Constitutional: Denies fevers, chills or abnormal weight loss Eyes: Denies blurriness of vision Ears, nose, mouth, throat, and face: Denies mucositis or sore throat Respiratory: Denies cough, dyspnea or wheezes Cardiovascular: Denies palpitation, chest discomfort or lower extremity swelling Gastrointestinal:  Denies nausea, constipation, diarrhea GU: Denies dysuria or incontinence Skin: Denies abnormal skin rashes Neurological: Per HPI Musculoskeletal: Back pain, knee pain Behavioral/Psych: +anxiety  Physical Exam: There were no vitals filed for this visit.  KPS: 90. General: Alert, cooperative, pleasant, in no acute distress Head: Craniotomy scar noted, dry and intact. EENT: No conjunctival injection or scleral icterus. Oral mucosa moist Lungs: Resp effort normal Cardiac: Regular rate and rhythm Abdomen: Soft,  non-distended abdomen Skin: No rashes cyanosis or petechiae. Extremities: No clubbing or edema  Neurologic Exam: Mental Status: Awake, alert, attentive to examiner. Oriented to self and environment. Language is fluent with intact comprehension.  Cranial Nerves: Visual acuity is grossly normal. Visual fields are full. Extra-ocular movements intact. No ptosis. Face is symmetric, tongue midline. Motor: Tone and bulk are normal. Power  is full in both arms and legs. Reflexes are symmetric, no pathologic reflexes present. Intact finger to nose bilaterally Sensory: Intact to light touch and temperature Gait: Normal and tandem gait is normal.   Labs: I have reviewed the data as listed    Component Value Date/Time   NA 143 11/12/2023 1118   NA 141 01/10/2016 1412   K 4.5 11/12/2023 1118   K 4.0 01/10/2016 1412   CL 103 11/12/2023 1118   CO2 22 11/12/2023 1118   CO2 30 (H) 01/10/2016 1412   GLUCOSE 108 (H) 11/12/2023 1118   GLUCOSE 120 (H) 01/01/2022 2206   GLUCOSE 102 01/10/2016 1412   BUN 18 11/12/2023 1118   BUN 19.3 01/10/2016 1412   CREATININE 0.87 11/12/2023 1118   CREATININE 0.91 10/01/2019 1132   CREATININE 0.9 01/10/2016 1412   CALCIUM  9.8 11/12/2023 1118   CALCIUM  9.8 01/10/2016 1412   PROT 6.7 11/12/2023 1118   ALBUMIN  4.2 11/12/2023 1118   AST 28 11/12/2023 1118   AST 18 10/01/2019 1132   ALT 33 (H) 11/12/2023 1118   ALT 21 10/01/2019 1132   ALKPHOS 120 11/12/2023 1118   BILITOT 0.6 11/12/2023 1118   BILITOT 0.7 10/01/2019 1132   GFRNONAA >60 01/01/2022 2206   GFRNONAA >60 10/01/2019 1132   GFRAA 59 (L) 12/03/2019 1603   GFRAA >60 10/01/2019 1132   Lab Results  Component Value Date   WBC 10.6 05/19/2023   NEUTROABS 6.7 05/19/2023   HGB 14.3 05/19/2023   HCT 44.5 05/19/2023   MCV 91 05/19/2023   PLT 302 05/19/2023   Component     Latest Ref Rng & Units 07/12/2017  Triiodothyronine,Free,Serum     2.0 - 4.4 pg/mL 1.4 (L)  T4,Free(Direct)     0.61 - 1.12  ng/dL 9.39 (L)  FSH     mIU/mL 0.7  Somatomedin C     38 - 163 ng/mL 108  LH     mIU/mL <0.2  Prolactin     4.8 - 23.3 ng/mL 4,548.0 (H)  C206 ACTH      7.2 - 63.3 pg/mL 15.4    Imaging:  [facility] Clinician Interpretation: I have personally reviewed the CNS images as listed.  My interpretation, in the context of the patient's clinical presentation, is stable disease   CT HEAD W & WO CONTRAST ( ) Result Date: 11/20/2023 CLINICAL DATA:  Brain/CNS neoplasm, monitor EXAM: CT HEAD WITHOUT AND WITH CONTRAST TECHNIQUE: Contiguous axial images were obtained from the base of the skull through the vertex without and with intravenous contrast. RADIATION DOSE REDUCTION: This exam was performed according to the departmental dose-optimization program which includes automated exposure control, adjustment of the mA and/or kV according to patient size and/or use of iterative reconstruction technique. CONTRAST:  75mL OMNIPAQUE  IOHEXOL  300 MG/ML  SOLN COMPARISON:  CT the head dated October 22, 2022. FINDINGS: Brain: Age-related cerebral volume loss and moderate periventricular and deep cerebral white matter disease. A colloid cyst measuring approximately 10 mm in diameter is again demonstrated in the roof of the third ventricle and appears unchanged. There is no evidence of hemorrhage or hydrocephalus. There is no definite evidence of mass in the pituitary fossa. There is no mass effect upon the optic chiasm. Vascular: Mild to moderate calcific atheromatous disease. Skull: Intact and unremarkable. Sinuses/Orbits: Clear paranasal sinuses. Status post bilateral lens replacement. Other: None. IMPRESSION: 1. Stable appearance of the pituitary fossa. No evidence of residual or recurrent pituitary neoplasm. 2. Stable colloid cyst within the roof  of the third ventricle. 3. Moderate cerebral white matter disease. Electronically Signed   By: Evalene Coho M.D.   On: 11/20/2023 15:22      Assessment/Plan 1. Pituitary  macroadenoma [facility])  Ms. Skilton is clinically and radiographically stable today regarding her prolactinoma.  CT demonstrates stable findings.  She should continue to take cabergoline  via endocrinology.  We appreciate the opportunity to participate in the care of Kimberly Larsen.   We ask that LAVONNE KINDERMAN return to clinic in 18 months following next brain CT, or sooner as needed.  All questions were answered. The patient knows to call the clinic with any problems, questions or concerns. No barriers to learning were detected.  The total time spent in the encounter was 30 minutes and more than 50% was on counseling and review of test results   Arthea MARLA Manns, MD Medical Director of Neuro-Oncology [facility] at [place] [facility] 12/02/23 10:33 AM

## 2023-12-26 ENCOUNTER — Ambulatory Visit: Admitting: Internal Medicine

## 2023-12-26 ENCOUNTER — Encounter: Payer: Self-pay | Admitting: Internal Medicine

## 2023-12-26 VITALS — BP 172/96 | HR 70 | Temp 97.3°F | Resp 18 | Ht 62.0 in | Wt 218.6 lb

## 2023-12-26 DIAGNOSIS — M7989 Other specified soft tissue disorders: Secondary | ICD-10-CM | POA: Insufficient documentation

## 2023-12-26 MED ORDER — CEPHALEXIN 500 MG PO CAPS: 500.0000 mg | ORAL_CAPSULE | Freq: Three times a day (TID) | ORAL | 0 refills | Status: DC

## 2023-12-26 NOTE — Assessment & Plan Note (Signed)
 I think she has cellulitis of her LLE but I also want to rule out DVT.  We will obtain a doppler US  of her LLE.

## 2023-12-26 NOTE — Progress Notes (Signed)
 Office Visit  Subjective   [name]   DOB: May 28, 1945   Age: 78 y.o.   MRN: [id number]   Chief Complaint Chief Complaint  Patient presents with   Leg Swelling    Pt in today for swelling in her leg and ankle. The patient reports a couple of weeks ago the swelling in her left leg and ankle became worse, reports swelling, redness, tenderness, and burning.      History of Present Illness Kimberly Larsen is a 78 yo female who comes in today with complaints of pain and swelling of the anterior shin of her left lower leg.  She has some swelling that is minor of her LLE where all these symptoms started about 2 weeks ago.  She has noticed some erythema that is mild as well and it burns and stings.  She has no evidence of trauma, cuts or lesions on her leg.  She will take advil  for her pain.     Past Medical History Past Medical History:  Diagnosis Date   Acute postoperative respiratory failure (HCC) 03/25/2014   Anesthesia of skin    Anxiety    Aortic atherosclerosis (HCC)    Arthritis    Back pain    Benign essential HTN 03/25/2014   Cancer (HCC)    endometrial cancer   Chronic daily headache 10/23/2017   Chronic low back pain 07/30/2017   Chronic pain syndrome 02/23/2014   DDD (degenerative disc disease), lumbar    Depression    Endometrial cancer (HCC) 01/10/2016   Fibromyalgia 09/08/2012   Gastroparesis    Genetic testing 12/17/2017   TumorNext [facility] + CancerNext was ordered. The CancerNext gene panel offered by [employer] includes sequencing and rearrangement analysis for the following 32 genes:   APC, ATM, BARD1, BMPR1A, BRCA1, BRCA2, BRIP1, CDH1, CDK4, CDKN2A, CHEK2, DICER1, EPCAM, GREM1, HOXB13MLH1, MRE11A, MSH2, MSH6, MUTYH, NBN, NF1, PALB2, PMS2, POLD1, POLE, PTEN, RAD50, RAD51D, SMAD4, SMARCA4, STK11, and TP53.   Ger   History of kidney stones    Hyperlipidemia 03/25/2014   Insomnia 09/08/2012   Long-term current use of opiate analgesic 05/28/2017    Lumbar post-laminectomy syndrome 07/30/2017   Near syncope 07/10/2017   NEPHROLITHIASIS 06/26/2006   Qualifier: Diagnosis of  By: JANEY PARODY     Obese 11/11/2018   Obesity (BMI 30-39.9) 09/02/2012   Obstructive uropathy 03/25/2014   Osteoporosis    Prolactinoma (HCC) 07/16/2017   S/P left TKA 11/10/2018   S/P right TKA 08/31/2012   UTI (urinary tract infection)      Allergies Allergies  Allergen Reactions   Amoxicillin -Pot Clavulanate Nausea Only    Other reaction(s): stomach upset   Bupropion Nausea Only    Other reaction(s): stomach upset     Medications  Current Outpatient Medications:    acetaminophen  (TYLENOL ) 325 MG tablet, Take 650 mg by mouth every 6 (six) hours as needed., Disp: , Rfl:    aspirin  81 MG EC tablet, Chew 1 tablet by mouth daily., Disp: , Rfl:    atorvastatin  (LIPITOR) 20 MG tablet, Take 1 tablet (20 mg total) by mouth at bedtime., Disp: 30 tablet, Rfl: 11   azelastine  (ASTELIN ) 0.1 % nasal spray, Place 2 sprays into both nostrils 2 (two) times daily as needed., Disp: 30 mL, Rfl: 5   AZO D-MANNOSE PO, Take by mouth., Disp: , Rfl:    calcium  citrate (CALCITRATE - DOSED IN MG ELEMENTAL CALCIUM ) 950 (200 Ca) MG tablet, Take 630 mg of elemental  calcium  by mouth daily., Disp: , Rfl:    cloNIDine  (CATAPRES ) 0.1 MG tablet, Take 1 tablet (0.1 mg total) by mouth at bedtime., Disp: 60 tablet, Rfl: 0   docusate sodium  (COLACE) 50 MG capsule, Take 50 mg by mouth at bedtime., Disp: , Rfl:    levothyroxine  (SYNTHROID ) 25 MCG tablet, Take 25 mcg by mouth daily before breakfast. , Disp: , Rfl:    losartan  (COZAAR ) 50 MG tablet, Take 1 tablet by mouth once daily, Disp: 90 tablet, Rfl: 0   Magnesium  400 MG TABS, Take 400 mg by mouth daily., Disp: , Rfl:    methocarbamol  (ROBAXIN ) 500 MG tablet, Take 1 tablet (500 mg total) by mouth 4 (four) times daily., Disp: 360 tablet, Rfl: 0   metoprolol  succinate (TOPROL -XL) 25 MG 24 hr tablet, Take 1 tablet by mouth once daily,  Disp: 90 tablet, Rfl: 0   montelukast  (SINGULAIR ) 10 MG tablet, TAKE 1 TABLET BY MOUTH EVERYDAY AT BEDTIME, Disp: 90 tablet, Rfl: 1   Multiple Vitamins-Minerals (HAIR SKIN & NAILS ADVANCED PO), Take by mouth., Disp: , Rfl:    Omega-3 Fatty Acids (FISH OIL ) 1000 MG CAPS, Take 1 capsule by mouth in the morning and at bedtime. , Disp: , Rfl:    ondansetron  (ZOFRAN ) 4 MG tablet, Take 1 tablet (4 mg total) by mouth every 8 (eight) hours as needed for nausea or vomiting., Disp: 30 tablet, Rfl: 0   pantoprazole  (PROTONIX ) 40 MG tablet, TAKE 1 TABLET BY MOUTH EVERY DAY, Disp: 90 tablet, Rfl: 1   polyethylene glycol (MIRALAX  / GLYCOLAX ) 17 g packet, Take 17 g by mouth 2 (two) times daily., Disp: 28 packet, Rfl: 0   pregabalin  (LYRICA ) 100 MG capsule, Take 1 capsule (100 mg total) by mouth 2 (two) times daily., Disp: 60 capsule, Rfl: 2   QUEtiapine  (SEROQUEL ) 100 MG tablet, TAKE 1 TABLET BY MOUTH EVERYDAY AT BEDTIME, Disp: 90 tablet, Rfl: 1   Review of Systems Review of Systems  Constitutional:  Negative for chills and fever.  Respiratory:  Negative for cough and shortness of breath.   Cardiovascular:  Positive for leg swelling. Negative for chest pain and palpitations.  Gastrointestinal:  Negative for abdominal pain, constipation, nausea and vomiting.  Skin:  Negative for itching and rash.  Neurological:  Negative for dizziness, weakness and headaches.       Objective:    Vitals BP (!) 172/96   Pulse 70   Temp (!) 97.3 F (36.3 C) (Temporal)   Resp 18   Ht 5' 2 (1.575 m)   Wt 218 lb 9.6 oz (99.2 kg)   SpO2 95%   BMI 39.98 kg/m    Physical Examination Physical Exam Constitutional:      Appearance: Normal appearance. She is not ill-appearing.  Cardiovascular:     Rate and Rhythm: Normal rate and regular rhythm.     Pulses: Normal pulses.     Heart sounds: No murmur heard.    No friction rub. No gallop.  Pulmonary:     Effort: Pulmonary effort is normal. No respiratory distress.      Breath sounds: No wheezing, rhonchi or rales.  Abdominal:     General: Bowel sounds are normal. There is no distension.     Palpations: Abdomen is soft.     Tenderness: There is no abdominal tenderness.  Musculoskeletal:     Right lower leg: No edema.     Left lower leg: Edema present.     Comments: There is mild  erythema of her more lower leg near her ankle.  I see no lesions.  There is increased warmth of her left leg compared to her right leg.  She had a negative homan's sign on the left.  Skin:    General: Skin is warm and dry.     Findings: No rash.  Neurological:     Mental Status: She is alert.        Assessment & Plan:   Leg swelling I think she has cellulitis of her LLE but I also want to rule out DVT.  We will obtain a doppler US  of her LLE.    No follow-ups on file.   Selinda Fleeta Finger, MD

## 2023-12-31 DIAGNOSIS — M7989 Other specified soft tissue disorders: Secondary | ICD-10-CM | POA: Diagnosis not present

## 2024-01-02 DIAGNOSIS — E038 Other specified hypothyroidism: Secondary | ICD-10-CM | POA: Diagnosis not present

## 2024-01-02 DIAGNOSIS — E221 Hyperprolactinemia: Secondary | ICD-10-CM | POA: Diagnosis not present

## 2024-01-02 DIAGNOSIS — D352 Benign neoplasm of pituitary gland: Secondary | ICD-10-CM | POA: Diagnosis not present

## 2024-01-07 ENCOUNTER — Encounter: Payer: Self-pay | Admitting: Internal Medicine

## 2024-01-07 ENCOUNTER — Ambulatory Visit: Admitting: Internal Medicine

## 2024-01-07 VITALS — BP 128/74 | HR 74 | Temp 97.2°F | Resp 18 | Ht 62.0 in | Wt 216.0 lb

## 2024-01-07 DIAGNOSIS — M79605 Pain in left leg: Secondary | ICD-10-CM | POA: Insufficient documentation

## 2024-01-07 MED ORDER — DICLOFENAC SODIUM 1 % EX GEL: 2.0000 g | Freq: Four times a day (QID) | CUTANEOUS | Status: DC

## 2024-01-07 NOTE — Assessment & Plan Note (Signed)
 I wonder whether her pain could be from her lower back.  I did some labs testing for myalgias but she is not describing that pain today.  This pain is located down the front of her shin.  I want her to use voltaren  gel.  We will get an EMG fo her left leg.

## 2024-01-07 NOTE — Progress Notes (Signed)
 Office Visit  Subjective   [name]   DOB: [birth year]   Age: 78 y.o.   MRN: [id number]   Chief Complaint Chief Complaint  Patient presents with   Leg Swelling    Pt in today for a follow up. The patient reports leg swelling, pain, and tenderness.     History of Present Illness Kimberly Larsen returns today for complaints of left lower leg pain.  I saw her a few months ago where she was having leg swelling of her LLE.  We did a venous US  at that time which was negative for DVT.  I felt she had some cellulitis and we treated it with antibiotics which did not help at all.  She is complaining of pain in her left shin from mid lower leg down to her ankle and soreness to the medial side of her left shin.  There is no swelling.  She has iced it and icy/hot.    In July 2025, she presented with soreness of her muscles and lower legs.  I did some labs at that time to make sure she did have PMR or polymyositis which was negative.  We did increase her lyrica  to 100mg  BID.  This started after she fell in September 2024 where she states she has a dull aching pain that is continuous and involving her lower thighs and lower legs.  She does not have knee pain but it is muscle pain.  She was on celebrex  200mg  daily but she stopped this a few years ago as it was not helping.  She is on lyrica  75mg  BID and robaxin  500mg  every 6 hours.  The patient has a history of chronic pain syndrome where she has chronic low back pain and fibromyalgia.  She states she has had chronic low back pain since 2013.  She had a lumbar laminectomy and fusion in 2013.  She states she had a spinal stimulator placed in 2015 for her chronic pain but this did not help.  She was on percocet from 2013 until 2020.  She states the NP from [facility] put her on lyrica  in 07/2022 and she states this did help.  She has a history of lumbar DDD and DJD where she has had both of her knees replaced.  She also has arthritis in her hands.  The  patient also has a history of fibromyalgia with pain with pain all over.  She had taken ibuprofen  and tylenol  together this morning but this did not help with her pain.  There is no new weakness/numbness of her lower legs and no loss of bowel/bladder function.     Past Medical History Past Medical History:  Diagnosis Date   Acute postoperative respiratory failure (HCC) 03/25/2014   Anesthesia of skin    Anxiety    Aortic atherosclerosis (HCC)    Arthritis    Back pain    Benign essential HTN 03/25/2014   Cancer (HCC)    endometrial cancer   Chronic daily headache 10/23/2017   Chronic low back pain 07/30/2017   Chronic pain syndrome 02/23/2014   DDD (degenerative disc disease), lumbar    Depression    Endometrial cancer (HCC) 01/10/2016   Fibromyalgia 09/08/2012   Gastroparesis    Genetic testing 12/17/2017   TumorNext [facility] + CancerNext was ordered. The CancerNext gene panel offered by [employer] includes sequencing and rearrangement analysis for the following 32 genes:   APC, ATM, BARD1, BMPR1A, BRCA1, BRCA2, BRIP1, CDH1, CDK4,  CDKN2A, CHEK2, DICER1, EPCAM, GREM1, HOXB13MLH1, MRE11A, MSH2, MSH6, MUTYH, NBN, NF1, PALB2, PMS2, POLD1, POLE, PTEN, RAD50, RAD51D, SMAD4, SMARCA4, STK11, and TP53.   Ger   History of kidney stones    Hyperlipidemia 03/25/2014   Insomnia 09/08/2012   Long-term current use of opiate analgesic 05/28/2017   Lumbar post-laminectomy syndrome 07/30/2017   Near syncope 07/10/2017   NEPHROLITHIASIS 06/26/2006   Qualifier: Diagnosis of  By: JANEY PARODY     Obese 11/11/2018   Obesity (BMI 30-39.9) 09/02/2012   Obstructive uropathy 03/25/2014   Osteoporosis    Prolactinoma (HCC) 07/16/2017   S/P left TKA 11/10/2018   S/P right TKA 08/31/2012   UTI (urinary tract infection)      Allergies Allergies  Allergen Reactions   Amoxicillin -Pot Clavulanate Nausea Only    Other reaction(s): stomach upset   Bupropion Nausea Only    Other reaction(s):  stomach upset     Medications  Current Outpatient Medications:    acetaminophen  (TYLENOL ) 325 MG tablet, Take 650 mg by mouth every 6 (six) hours as needed., Disp: , Rfl:    aspirin  81 MG EC tablet, Chew 1 tablet by mouth daily., Disp: , Rfl:    atorvastatin  (LIPITOR) 20 MG tablet, Take 1 tablet (20 mg total) by mouth at bedtime., Disp: 30 tablet, Rfl: 11   azelastine  (ASTELIN ) 0.1 % nasal spray, Place 2 sprays into both nostrils 2 (two) times daily as needed., Disp: 30 mL, Rfl: 5   AZO D-MANNOSE PO, Take by mouth., Disp: , Rfl:    calcium  citrate (CALCITRATE - DOSED IN MG ELEMENTAL CALCIUM ) 950 (200 Ca) MG tablet, Take 630 mg of elemental calcium  by mouth daily., Disp: , Rfl:    cloNIDine  (CATAPRES ) 0.1 MG tablet, Take 1 tablet (0.1 mg total) by mouth at bedtime., Disp: 60 tablet, Rfl: 0   docusate sodium  (COLACE) 50 MG capsule, Take 50 mg by mouth at bedtime., Disp: , Rfl:    levothyroxine  (SYNTHROID ) 25 MCG tablet, Take 25 mcg by mouth daily before breakfast. , Disp: , Rfl:    losartan  (COZAAR ) 50 MG tablet, Take 1 tablet by mouth once daily, Disp: 90 tablet, Rfl: 0   Magnesium  400 MG TABS, Take 400 mg by mouth daily., Disp: , Rfl:    methocarbamol  (ROBAXIN ) 500 MG tablet, Take 1 tablet (500 mg total) by mouth 4 (four) times daily., Disp: 360 tablet, Rfl: 0   metoprolol  succinate (TOPROL -XL) 25 MG 24 hr tablet, Take 1 tablet by mouth once daily, Disp: 90 tablet, Rfl: 0   montelukast  (SINGULAIR ) 10 MG tablet, TAKE 1 TABLET BY MOUTH EVERYDAY AT BEDTIME, Disp: 90 tablet, Rfl: 1   Multiple Vitamins-Minerals (HAIR SKIN & NAILS ADVANCED PO), Take by mouth., Disp: , Rfl:    Omega-3 Fatty Acids (FISH OIL ) 1000 MG CAPS, Take 1 capsule by mouth in the morning and at bedtime. , Disp: , Rfl:    ondansetron  (ZOFRAN ) 4 MG tablet, Take 1 tablet (4 mg total) by mouth every 8 (eight) hours as needed for nausea or vomiting., Disp: 30 tablet, Rfl: 0   pantoprazole  (PROTONIX ) 40 MG tablet, TAKE 1 TABLET BY  MOUTH EVERY DAY, Disp: 90 tablet, Rfl: 1   polyethylene glycol (MIRALAX  / GLYCOLAX ) 17 g packet, Take 17 g by mouth 2 (two) times daily., Disp: 28 packet, Rfl: 0   pregabalin  (LYRICA ) 100 MG capsule, Take 1 capsule (100 mg total) by mouth 2 (two) times daily., Disp: 60 capsule, Rfl: 2   QUEtiapine  (SEROQUEL ) 100  MG tablet, TAKE 1 TABLET BY MOUTH EVERYDAY AT BEDTIME, Disp: 90 tablet, Rfl: 1   Review of Systems Review of Systems  Constitutional:  Negative for chills and fever.  Respiratory:  Negative for shortness of breath.   Cardiovascular:  Negative for chest pain.  Musculoskeletal:  Negative for myalgias.  Neurological:  Negative for dizziness, focal weakness and weakness.       Objective:    Vitals BP 128/74   Pulse 74   Temp (!) 97.2 F (36.2 C) (Temporal)   Resp 18   Ht 5' 2 (1.575 m)   Wt 216 lb (98 kg)   SpO2 95%   BMI 39.51 kg/m    Physical Examination Physical Exam Constitutional:      Appearance: Normal appearance. She is not ill-appearing.  Cardiovascular:     Rate and Rhythm: Normal rate and regular rhythm.     Pulses: Normal pulses.     Heart sounds: No murmur heard.    No friction rub. No gallop.  Pulmonary:     Effort: Pulmonary effort is normal. No respiratory distress.     Breath sounds: No wheezing, rhonchi or rales.  Abdominal:     General: Bowel sounds are normal. There is no distension.     Palpations: Abdomen is soft.     Tenderness: There is no abdominal tenderness.  Musculoskeletal:     Right lower leg: No edema.     Left lower leg: No edema.  Skin:    General: Skin is warm and dry.     Findings: No rash.  Neurological:     Mental Status: She is alert.     Comments: Strength is 5/5 in both Lower legs and feet.        Assessment & Plan:   Pain of left lower extremity I wonder whether her pain could be from her lower back.  I did some labs testing for myalgias but she is not describing that pain today.  This pain is located down the  front of her shin.  I want her to use voltaren  gel.  We will get an EMG fo her left leg.    No follow-ups on file.   Selinda Fleeta Finger, MD

## 2024-01-12 ENCOUNTER — Other Ambulatory Visit: Payer: Self-pay | Admitting: Internal Medicine

## 2024-01-12 ENCOUNTER — Other Ambulatory Visit: Payer: Self-pay

## 2024-01-12 DIAGNOSIS — I10 Essential (primary) hypertension: Secondary | ICD-10-CM

## 2024-01-19 ENCOUNTER — Other Ambulatory Visit: Payer: Self-pay | Admitting: Internal Medicine

## 2024-01-22 ENCOUNTER — Encounter

## 2024-02-09 ENCOUNTER — Other Ambulatory Visit: Payer: Self-pay | Admitting: Internal Medicine

## 2024-02-12 ENCOUNTER — Encounter: Payer: Self-pay | Admitting: Internal Medicine

## 2024-02-12 ENCOUNTER — Ambulatory Visit: Admitting: Internal Medicine

## 2024-02-12 VITALS — BP 152/82 | HR 58 | Temp 97.5°F | Resp 16 | Ht 62.0 in | Wt 208.8 lb

## 2024-02-12 DIAGNOSIS — R7303 Prediabetes: Secondary | ICD-10-CM

## 2024-02-12 NOTE — Assessment & Plan Note (Signed)
 Her prediabetes has been controlled with diet and exercise.  Her leg pain is gone and she states she is not going to go for EMG.  We will see her back in 3 months for a yearly exam.

## 2024-02-12 NOTE — Progress Notes (Signed)
 Office Visit  Subjective   [name]   DOB: 1945/12/17   Age: 78 y.o.   MRN: [id number]   Chief Complaint Chief Complaint  Patient presents with   Follow-up    3 Month follow up     History of Present Illness I did see Kimberly Larsen a month ago due to pain of her left lower leg.  I saw her a few months ago where she was having leg swelling of her LLE.  We did a venous US  at that time which was negative for DVT.  I felt she had some cellulitis and we treated it with antibiotics which did not help at all.  She is complaining of pain in her left shin from mid lower leg down to her ankle and soreness to the medial side of her left shin.  There is no swelling.  She has iced it and icy/hot.  I wanted to set her up for an EMG but over the interim, she states her pain is now resolved.    The patient also returns for followup of her prediabetes.  The patient was noted to be prediabetic on her yearly labs from 04/2023.  Her HgBa1c at that time was 6%.  We asked her to control this with diet and exercise.  She is currently not on any medications. She is going to the [facility] now and is using stationary bicycle and walking.  She specifically denies unexplained abdominal pain, nausea or vomiting. She does not routinely check blood sugars.  Her last HgBA1c was done 3 months ago and was 6.1%. She came in fasting today in anticipation of lab work.        Past Medical History Past Medical History:  Diagnosis Date   Acute postoperative respiratory failure 03/25/2014   Anesthesia of skin    Anxiety    Aortic atherosclerosis    Arthritis    Back pain    Benign essential HTN 03/25/2014   Cancer (HCC)    endometrial cancer   Chronic daily headache 10/23/2017   Chronic low back pain 07/30/2017   Chronic pain syndrome 02/23/2014   DDD (degenerative disc disease), lumbar    Depression    Endometrial cancer (HCC) 01/10/2016   Fibromyalgia 09/08/2012   Gastroparesis    Genetic testing  12/17/2017   TumorNext [facility] + CancerNext was ordered. The CancerNext gene panel offered by [employer] includes sequencing and rearrangement analysis for the following 32 genes:   APC, ATM, BARD1, BMPR1A, BRCA1, BRCA2, BRIP1, CDH1, CDK4, CDKN2A, CHEK2, DICER1, EPCAM, GREM1, HOXB13MLH1, MRE11A, MSH2, MSH6, MUTYH, NBN, NF1, PALB2, PMS2, POLD1, POLE, PTEN, RAD50, RAD51D, SMAD4, SMARCA4, STK11, and TP53.   Ger   History of kidney stones    Hyperlipidemia 03/25/2014   Insomnia 09/08/2012   Long-term current use of opiate analgesic 05/28/2017   Lumbar post-laminectomy syndrome 07/30/2017   Near syncope 07/10/2017   NEPHROLITHIASIS 06/26/2006   Qualifier: Diagnosis of  By: JANEY PARODY     Obese 11/11/2018   Obesity (BMI 30-39.9) 09/02/2012   Obstructive uropathy 03/25/2014   Osteoporosis    Prolactinoma (HCC) 07/16/2017   S/P left TKA 11/10/2018   S/P right TKA 08/31/2012   UTI (urinary tract infection)      Allergies Allergies  Allergen Reactions   Amoxicillin -Pot Clavulanate Nausea Only    Other reaction(s): stomach upset   Bupropion Nausea Only    Other reaction(s): stomach upset     Medications  Current Outpatient Medications:  acetaminophen  (TYLENOL ) 325 MG tablet, Take 650 mg by mouth every 6 (six) hours as needed., Disp: , Rfl:    aspirin  81 MG EC tablet, Chew 1 tablet by mouth daily., Disp: , Rfl:    atorvastatin  (LIPITOR) 20 MG tablet, Take 1 tablet (20 mg total) by mouth at bedtime., Disp: 30 tablet, Rfl: 11   azelastine  (ASTELIN ) 0.1 % nasal spray, Place 2 sprays into both nostrils 2 (two) times daily as needed., Disp: 30 mL, Rfl: 5   AZO D-MANNOSE PO, Take by mouth., Disp: , Rfl:    calcium  citrate (CALCITRATE - DOSED IN MG ELEMENTAL CALCIUM ) 950 (200 Ca) MG tablet, Take 630 mg of elemental calcium  by mouth daily., Disp: , Rfl:    cloNIDine  (CATAPRES ) 0.1 MG tablet, Take 1 tablet (0.1 mg total) by mouth at bedtime., Disp: 60 tablet, Rfl: 0   docusate sodium   (COLACE) 50 MG capsule, Take 50 mg by mouth at bedtime., Disp: , Rfl:    levothyroxine  (SYNTHROID ) 25 MCG tablet, Take 25 mcg by mouth daily before breakfast. , Disp: , Rfl:    losartan  (COZAAR ) 50 MG tablet, Take 1 tablet by mouth once daily, Disp: 90 tablet, Rfl: 0   Magnesium  400 MG TABS, Take 400 mg by mouth daily., Disp: , Rfl:    methocarbamol  (ROBAXIN ) 500 MG tablet, Take 1 tablet (500 mg total) by mouth 4 (four) times daily., Disp: 360 tablet, Rfl: 0   metoprolol  succinate (TOPROL -XL) 25 MG 24 hr tablet, Take 1 tablet by mouth once daily, Disp: 90 tablet, Rfl: 0   montelukast  (SINGULAIR ) 10 MG tablet, TAKE 1 TABLET BY MOUTH EVERYDAY AT BEDTIME, Disp: 90 tablet, Rfl: 1   Multiple Vitamins-Minerals (HAIR SKIN & NAILS ADVANCED PO), Take by mouth., Disp: , Rfl:    Omega-3 Fatty Acids (FISH OIL ) 1000 MG CAPS, Take 1 capsule by mouth in the morning and at bedtime. , Disp: , Rfl:    ondansetron  (ZOFRAN ) 4 MG tablet, Take 1 tablet (4 mg total) by mouth every 8 (eight) hours as needed for nausea or vomiting., Disp: 30 tablet, Rfl: 0   pantoprazole  (PROTONIX ) 40 MG tablet, TAKE 1 TABLET BY MOUTH EVERY DAY, Disp: 90 tablet, Rfl: 1   polyethylene glycol (MIRALAX  / GLYCOLAX ) 17 g packet, Take 17 g by mouth 2 (two) times daily., Disp: 28 packet, Rfl: 0   pregabalin  (LYRICA ) 100 MG capsule, Take 1 capsule (100 mg total) by mouth 2 (two) times daily., Disp: 60 capsule, Rfl: 2   QUEtiapine  (SEROQUEL ) 100 MG tablet, TAKE 1 TABLET BY MOUTH EVERYDAY AT BEDTIME, Disp: 90 tablet, Rfl: 1   sertraline  (ZOLOFT ) 50 MG tablet, TAKE 1 TABLET BY MOUTH EVERY DAY, Disp: 90 tablet, Rfl: 3   Review of Systems Review of Systems  Constitutional:  Negative for chills, fever and malaise/fatigue.  Eyes:  Negative for blurred vision and double vision.  Respiratory:  Negative for shortness of breath.   Cardiovascular:  Negative for chest pain, palpitations and leg swelling.  Gastrointestinal:  Negative for abdominal pain,  constipation, diarrhea, nausea and vomiting.  Genitourinary:  Negative for frequency.  Musculoskeletal:  Negative for myalgias.  Neurological:  Negative for dizziness, weakness and headaches.  Endo/Heme/Allergies:  Negative for polydipsia.       Objective:    Vitals BP (!) 152/82   Pulse (!) 58   Temp (!) 97.5 F (36.4 C) (Temporal)   Resp 16   Ht 5' 2 (1.575 m)   Wt 208 lb 12.8 oz (  94.7 kg)   SpO2 96%   BMI 38.19 kg/m    Physical Examination Physical Exam Constitutional:      Appearance: Normal appearance. She is not ill-appearing.  Cardiovascular:     Rate and Rhythm: Normal rate and regular rhythm.     Pulses: Normal pulses.     Heart sounds: No murmur heard.    No friction rub. No gallop.  Pulmonary:     Effort: Pulmonary effort is normal. No respiratory distress.     Breath sounds: No wheezing, rhonchi or rales.  Abdominal:     General: Bowel sounds are normal. There is no distension.     Palpations: Abdomen is soft.     Tenderness: There is no abdominal tenderness.  Musculoskeletal:     Right lower leg: No edema.     Left lower leg: No edema.  Skin:    General: Skin is warm and dry.     Findings: No rash.  Neurological:     Mental Status: She is alert.        Assessment & Plan:   Prediabetes Her prediabetes has been controlled with diet and exercise.  Her leg pain is gone and she states she is not going to go for EMG.  We will see her back in 3 months for a yearly exam.    Return in about 3 months (around 05/19/2024) for annual.   Selinda Fleeta Finger, MD

## 2024-02-16 ENCOUNTER — Other Ambulatory Visit: Payer: Self-pay | Admitting: Internal Medicine

## 2024-02-16 DIAGNOSIS — M791 Myalgia, unspecified site: Secondary | ICD-10-CM

## 2024-02-19 ENCOUNTER — Other Ambulatory Visit: Payer: Self-pay | Admitting: Internal Medicine

## 2024-02-19 DIAGNOSIS — M791 Myalgia, unspecified site: Secondary | ICD-10-CM

## 2024-02-19 MED ORDER — PREGABALIN 100 MG PO CAPS: 100.0000 mg | ORAL_CAPSULE | Freq: Two times a day (BID) | ORAL | 2 refills | Status: DC

## 2024-03-02 ENCOUNTER — Other Ambulatory Visit: Payer: Self-pay | Admitting: Internal Medicine

## 2024-03-08 ENCOUNTER — Other Ambulatory Visit: Payer: Self-pay | Admitting: Internal Medicine

## 2024-03-15 ENCOUNTER — Ambulatory Visit: Admitting: Internal Medicine

## 2024-03-15 ENCOUNTER — Encounter: Payer: Self-pay | Admitting: Internal Medicine

## 2024-03-15 VITALS — BP 142/82 | HR 72 | Temp 97.8°F | Resp 18 | Wt 208.0 lb

## 2024-03-15 DIAGNOSIS — R319 Hematuria, unspecified: Secondary | ICD-10-CM

## 2024-03-15 DIAGNOSIS — N390 Urinary tract infection, site not specified: Secondary | ICD-10-CM

## 2024-03-15 LAB — POCT URINALYSIS DIPSTICK
Bilirubin, UA: NEGATIVE
Glucose, UA: NEGATIVE
Ketones, UA: NEGATIVE
Nitrite, UA: POSITIVE
Protein, UA: POSITIVE — AB
Spec Grav, UA: <=1.005 — AB (ref 1.010–1.025)
Urobilinogen, UA: 0.2 U/dL
pH, UA: 6 (ref 5.0–8.0)

## 2024-03-15 MED ORDER — NITROFURANTOIN MACROCRYSTAL 100 MG PO CAPS: 100.0000 mg | ORAL_CAPSULE | Freq: Four times a day (QID) | ORAL | 0 refills | Status: AC

## 2024-03-15 NOTE — Assessment & Plan Note (Signed)
 Her urine analysis is suggestive of urinary tract infection.  I will send urine for culture and start her on nitrofurantoin  100 mg 4 times a day for 7 days.  If she is not better then she will call

## 2024-03-15 NOTE — Progress Notes (Addendum)
   Acute Office Visit  Subjective:     [name], female    DOB: 08-Jan-1946, 78 y.o.   MRN: [id number]  Chief Complaint  Patient presents with   office visit    Patient here for uti     HPI Patient is in today for pressure in her lower abdomen and can not hold her urine for 2 weeks. No burning urine. She denies any fever or chills but she does not feel good.   Review of Systems  Constitutional: Negative.   Gastrointestinal: Negative.   Genitourinary:  Positive for urgency.        Objective:    BP (!) 142/82   Pulse 72   Temp 97.8 F (36.6 C)   Resp 18   Wt 208 lb (94.3 kg)   SpO2 97%   BMI 38.04 kg/m    Physical Exam Constitutional:      Appearance: Normal appearance.  Abdominal:     General: Bowel sounds are normal.     Palpations: Abdomen is soft.  Neurological:     Mental Status: She is alert.     Results for orders placed or performed in visit on 03/15/24  POCT urinalysis dipstick  Result Value Ref Range   Color, UA yellow    Clarity, UA cloudy    Glucose, UA Negative Negative   Bilirubin, UA neg    Ketones, UA neg    Spec Grav, UA <=1.005 (A) 1.010 - 1.025   Blood, UA mod    pH, UA 6.0 5.0 - 8.0   Protein, UA Positive (A) Negative   Urobilinogen, UA 0.2 0.2 or 1.0 E.U./dL   Nitrite, UA pos    Leukocytes, UA Trace (A) Negative   Appearance cloudy    Odor yes         Assessment & Plan:   Problem List Items Addressed This Visit       Genitourinary   UTI (urinary tract infection) - Primary   Her urine analysis is suggestive of urinary tract infection.  I will send urine for culture and start her on nitrofurantoin  100 mg 4 times a day for 7 days.  If she is not better then she will call      Relevant Medications   nitrofurantoin  (MACRODANTIN ) 100 MG capsule   Other Relevant Orders   POCT urinalysis dipstick (Completed)    No orders of the defined types were placed in this encounter.   No follow-ups on file.  Roetta Dare, MD

## 2024-03-15 NOTE — Addendum Note (Signed)
 Addended byBETHA HILDEGARD ERNST on: 03/15/2024 03:45 PM   Modules accepted: Orders

## 2024-03-17 DIAGNOSIS — D352 Benign neoplasm of pituitary gland: Secondary | ICD-10-CM | POA: Diagnosis not present

## 2024-03-18 LAB — URINE CULTURE

## 2024-03-19 ENCOUNTER — Encounter: Payer: Self-pay | Admitting: Internal Medicine

## 2024-03-19 ENCOUNTER — Other Ambulatory Visit: Payer: Self-pay | Admitting: Internal Medicine

## 2024-03-19 DIAGNOSIS — G894 Chronic pain syndrome: Secondary | ICD-10-CM

## 2024-04-08 DIAGNOSIS — L301 Dyshidrosis [pompholyx]: Secondary | ICD-10-CM | POA: Diagnosis not present

## 2024-04-15 ENCOUNTER — Ambulatory Visit: Admitting: Internal Medicine

## 2024-04-15 ENCOUNTER — Encounter: Payer: Self-pay | Admitting: Internal Medicine

## 2024-04-15 VITALS — BP 126/80 | HR 63 | Temp 97.1°F | Resp 18 | Ht 62.0 in | Wt 167.5 lb

## 2024-04-15 DIAGNOSIS — N3090 Cystitis, unspecified without hematuria: Secondary | ICD-10-CM

## 2024-04-15 LAB — POCT URINALYSIS DIPSTICK
Bilirubin, UA: NEGATIVE
Glucose, UA: NEGATIVE
Ketones, UA: NEGATIVE
Spec Grav, UA: 1.015 (ref 1.010–1.025)
pH, UA: 7 (ref 5.0–8.0)

## 2024-04-15 MED ORDER — CEPHALEXIN 500 MG PO CAPS: 500.0000 mg | ORAL_CAPSULE | Freq: Four times a day (QID) | ORAL | 0 refills | Status: AC

## 2024-05-01 NOTE — Progress Notes (Signed)
" ° °  Office Visit  Subjective   [name]   DOB: 02-12-1946   Age: 79 y.o.   MRN: [id number]   Chief Complaint Chief Complaint  Patient presents with   Urinary Tract Infection    Office visit     History of Present Illness   79 years old female who is here  Complaining of burning urination and lower abdominal pain.  No fever or chills.  She does has some burning urination as well.  Her urine analysis done today shows large leukocyte esterase suggested of acute cystitis with trace blood.  Past Medical History Past Medical History:  Diagnosis Date   Acute postoperative respiratory failure 03/25/2014   Anesthesia of skin    Anxiety    Aortic atherosclerosis    Arthritis    Back pain    Benign essential HTN 03/25/2014   Cancer (HCC)    endometrial cancer   Chronic daily headache 10/23/2017   Chronic low back pain 07/30/2017   Chronic pain syndrome 02/23/2014   DDD (degenerative disc disease), lumbar    Depression    Endometrial cancer (HCC) 01/10/2016   Fibromyalgia 09/08/2012   Gastroparesis    Genetic testing 12/17/2017   TumorNext [facility] + CancerNext was ordered. The CancerNext gene panel offered by [employer] includes sequencing and rearrangement analysis for the following 32 genes:   APC, ATM, BARD1, BMPR1A, BRCA1, BRCA2, BRIP1, CDH1, CDK4, CDKN2A, CHEK2, DICER1, EPCAM, GREM1, HOXB13MLH1, MRE11A, MSH2, MSH6, MUTYH, NBN, NF1, PALB2, PMS2, POLD1, POLE, PTEN, RAD50, RAD51D, SMAD4, SMARCA4, STK11, and TP53.   Ger   History of kidney stones    Hyperlipidemia 03/25/2014   Insomnia 09/08/2012   Long-term current use of opiate analgesic 05/28/2017   Lumbar post-laminectomy syndrome 07/30/2017   Near syncope 07/10/2017   NEPHROLITHIASIS 06/26/2006   Qualifier: Diagnosis of  By: JANEY PARODY     Obese 11/11/2018   Obesity (BMI 30-39.9) 09/02/2012   Obstructive uropathy 03/25/2014   Osteoporosis    Prolactinoma (HCC) 07/16/2017   S/P left TKA 11/10/2018    S/P right TKA 08/31/2012   UTI (urinary tract infection)      Allergies Allergies[1]   Review of Systems Review of Systems  Gastrointestinal:  Positive for abdominal pain.  Genitourinary:  Positive for dysuria.       Objective:    Vitals BP 126/80   Pulse 63   Temp (!) 97.1 F (36.2 C)   Resp 18   Ht 5' 2 (1.575 m)   Wt 167 lb 8 oz (76 kg)   SpO2 99%   BMI 30.64 kg/m    Physical Examination Physical Exam Constitutional:      Appearance: Normal appearance.  Abdominal:     General: Bowel sounds are normal.     Palpations: Abdomen is soft.  Neurological:     Mental Status: She is alert.        Assessment & Plan:   Cystitis   She has acute cystitis with hematuria.  I will send urine for culture and start her on Keflex  in 500 mg every 6 hour for 7 days.  If she is not better then she will call.    No follow-ups on file.   Roetta Dare, MD      [1]  Allergies Allergen Reactions   Amoxicillin -Pot Clavulanate Nausea Only    Other reaction(s): stomach upset   Bupropion Nausea Only    Other reaction(s): stomach upset   "

## 2024-05-01 NOTE — Assessment & Plan Note (Signed)
"    She has acute cystitis with hematuria.  I will send urine for culture and start her on Keflex  in 500 mg every 6 hour for 7 days.  If she is not better then she will call. "

## 2024-05-07 ENCOUNTER — Other Ambulatory Visit: Payer: Self-pay | Admitting: Internal Medicine

## 2024-05-07 MED ORDER — QUETIAPINE FUMARATE 100 MG PO TABS: 100.0000 mg | ORAL_TABLET | Freq: Every day | ORAL | 1 refills | Status: AC

## 2024-05-07 MED ORDER — AZELASTINE HCL 0.1 % NA SOLN: 2.0000 | Freq: Two times a day (BID) | NASAL | 5 refills | Status: AC | PRN

## 2024-05-17 ENCOUNTER — Other Ambulatory Visit: Payer: Self-pay

## 2024-05-17 DIAGNOSIS — I10 Essential (primary) hypertension: Secondary | ICD-10-CM

## 2024-05-17 MED ORDER — METOPROLOL SUCCINATE ER 25 MG PO TB24: 25.0000 mg | ORAL_TABLET | Freq: Every day | ORAL | 0 refills | Status: AC

## 2024-05-20 ENCOUNTER — Encounter: Admitting: Internal Medicine

## 2024-05-20 ENCOUNTER — Ambulatory Visit: Admitting: Internal Medicine

## 2024-05-20 ENCOUNTER — Encounter: Payer: Self-pay | Admitting: Internal Medicine

## 2024-05-20 VITALS — BP 124/70 | HR 78 | Temp 97.2°F | Resp 18 | Ht 62.0 in | Wt 211.5 lb

## 2024-05-20 DIAGNOSIS — I10 Essential (primary) hypertension: Secondary | ICD-10-CM

## 2024-05-20 DIAGNOSIS — D352 Benign neoplasm of pituitary gland: Secondary | ICD-10-CM

## 2024-05-20 DIAGNOSIS — R7303 Prediabetes: Secondary | ICD-10-CM | POA: Diagnosis not present

## 2024-05-20 DIAGNOSIS — E782 Mixed hyperlipidemia: Secondary | ICD-10-CM

## 2024-05-20 DIAGNOSIS — I700 Atherosclerosis of aorta: Secondary | ICD-10-CM | POA: Diagnosis not present

## 2024-05-20 DIAGNOSIS — E039 Hypothyroidism, unspecified: Secondary | ICD-10-CM | POA: Diagnosis not present

## 2024-05-20 NOTE — Progress Notes (Signed)
 "  Office Visit  Subjective   [name]   DOB: May 09, 1945   Age: 78 y.o.   MRN: [id number]   Chief Complaint Chief Complaint  Patient presents with   Annual Exam    Medicare AWV     History of Present Illness 79 years old female is here for annual wellness examination. he live in apartment alone, she does not smoke and she does not drink, He fell twice in 12/2022 from her bed. She fell gain 2 months ago when she tripped. She score 30/30 on MMSE, she is independent in all ADL.   She get flu shot every year, she also has COVID vaccine, she has pneuminia vaccine, she also has shingle vaccine, her tetanus shot was given with in last 10 years.   She stopped having 2 years ago and she has colonoscopy in that past but she has passed the age of colonoscopy. She has dexa scan with in 3 years. She has uterine cancer and had hysterectomy done. She follows with oncologist every 18 months now.   No cancer or diabetes in her family. Her mother has stroke.     She has a swelling in her leg where venous Doppler was negative.  Her pain is better.   She has hypertension and her blood pressure is well controlled.    She also has prediabetes and she watch her diet and her last hemoglobin A1c was 6.1%.   Past Medical History Past Medical History:  Diagnosis Date   Acute postoperative respiratory failure 03/25/2014   Anesthesia of skin    Anxiety    Aortic atherosclerosis    Arthritis    Back pain    Benign essential HTN 03/25/2014   Cancer (HCC)    endometrial cancer   Chronic daily headache 10/23/2017   Chronic low back pain 07/30/2017   Chronic pain syndrome 02/23/2014   DDD (degenerative disc disease), lumbar    Depression    Endometrial cancer (HCC) 01/10/2016   Fibromyalgia 09/08/2012   Gastroparesis    Genetic testing 12/17/2017   TumorNext [facility] + CancerNext was ordered. The CancerNext gene panel offered by [employer] includes sequencing and rearrangement analysis  for the following 32 genes:   APC, ATM, BARD1, BMPR1A, BRCA1, BRCA2, BRIP1, CDH1, CDK4, CDKN2A, CHEK2, DICER1, EPCAM, GREM1, HOXB13MLH1, MRE11A, MSH2, MSH6, MUTYH, NBN, NF1, PALB2, PMS2, POLD1, POLE, PTEN, RAD50, RAD51D, SMAD4, SMARCA4, STK11, and TP53.   Ger   History of kidney stones    Hyperlipidemia 03/25/2014   Insomnia 09/08/2012   Long-term current use of opiate analgesic 05/28/2017   Lumbar post-laminectomy syndrome 07/30/2017   Near syncope 07/10/2017   NEPHROLITHIASIS 06/26/2006   Qualifier: Diagnosis of  By: JANEY PARODY     Obese 11/11/2018   Obesity (BMI 30-39.9) 09/02/2012   Obstructive uropathy 03/25/2014   Osteoporosis    Prolactinoma (HCC) 07/16/2017   S/P left TKA 11/10/2018   S/P right TKA 08/31/2012   UTI (urinary tract infection)      Allergies Allergies[1]   Review of Systems Review of Systems  Constitutional: Negative.   HENT: Negative.    Respiratory: Negative.    Cardiovascular: Negative.   Gastrointestinal: Negative.   Neurological: Negative.        Objective:    Vitals BP 124/70   Pulse 78   Temp (!) 97.2 F (36.2 C)   Resp 18   Ht 5' 2 (1.575 m)   Wt 211 lb 8 oz (95.9 kg)  SpO2 96%   BMI 38.68 kg/m    Physical Examination Physical Exam Constitutional:      Appearance: Normal appearance. She is obese.  HENT:     Head: Normocephalic and atraumatic.  Eyes:     Extraocular Movements: Extraocular movements intact.     Pupils: Pupils are equal, round, and reactive to light.  Cardiovascular:     Rate and Rhythm: Normal rate and regular rhythm.     Heart sounds: Normal heart sounds.  Pulmonary:     Effort: Pulmonary effort is normal.     Breath sounds: Normal breath sounds.  Abdominal:     General: Bowel sounds are normal.     Palpations: Abdomen is soft.  Neurological:     General: No focal deficit present.     Mental Status: She is alert and oriented to person, place, and time.        Assessment & Plan:   Benign  essential HTN   Her blood pressure is controlled.  Aortic atherosclerosis   She has aortic atherosclerosis and goal of her LDL is below 70.  I will do lipid panel and CMP today.  Hypothyroidism   She is on levothyroxine  25 mcg daily.  She follows with endocrinologist.  Pituitary macroadenoma [facility])   She will continue to follow with endocrinologist.  Prediabetes   I will repeat hemoglobin A1c.  She will continue to watch her diet.  Hyperlipidemia   She takes atorvastatin  20 mg daily without any side effects.  I will do lipid panel and CMP today.  Because of aortic atherosclerosis will keep LDL below 70.  Morbid obesity (HCC)   Her BMI is 38.  With underlying hypertension and hyperlipidemia make it morbidly obese.    Return in about 3 months (around 08/18/2024).   Roetta Dare, MD      [1]  Allergies Allergen Reactions   Amoxicillin -Pot Clavulanate Nausea Only    Other reaction(s): stomach upset   Bupropion Nausea Only    Other reaction(s): stomach upset   "

## 2024-05-23 NOTE — Assessment & Plan Note (Signed)
"    She takes atorvastatin  20 mg daily without any side effects.  I will do lipid panel and CMP today.  Because of aortic atherosclerosis will keep LDL below 70. "

## 2024-05-23 NOTE — Assessment & Plan Note (Signed)
 Her blood pressure is controlled.

## 2024-05-23 NOTE — Assessment & Plan Note (Signed)
 She will continue to follow with endocrinologist.

## 2024-05-23 NOTE — Assessment & Plan Note (Signed)
"    She has aortic atherosclerosis and goal of her LDL is below 70.  I will do lipid panel and CMP today. "

## 2024-05-23 NOTE — Assessment & Plan Note (Addendum)
"    She is on levothyroxine  25 mcg daily.  She follows with endocrinologist. "

## 2024-05-23 NOTE — Assessment & Plan Note (Signed)
"    Her BMI is 38.  With underlying hypertension and hyperlipidemia make it morbidly obese. "

## 2024-05-23 NOTE — Assessment & Plan Note (Signed)
"    I will repeat hemoglobin A1c.  She will continue to watch her diet. "

## 2024-05-25 ENCOUNTER — Other Ambulatory Visit: Payer: Self-pay

## 2024-05-25 MED ORDER — MONTELUKAST SODIUM 10 MG PO TABS: 10.0000 mg | ORAL_TABLET | Freq: Every day | ORAL | 1 refills | Status: AC

## 2024-05-25 MED ORDER — METHOCARBAMOL 500 MG PO TABS: 500.0000 mg | ORAL_TABLET | Freq: Four times a day (QID) | ORAL | 2 refills | Status: AC

## 2024-05-27 ENCOUNTER — Other Ambulatory Visit: Payer: Self-pay | Admitting: Internal Medicine

## 2024-05-27 DIAGNOSIS — M791 Myalgia, unspecified site: Secondary | ICD-10-CM

## 2024-05-27 LAB — LIPID PANEL
Chol/HDL Ratio: 3.4 ratio (ref 0.0–4.4)
Cholesterol, Total: 162 mg/dL (ref 100–199)
HDL: 47 mg/dL
LDL Chol Calc (NIH): 83 mg/dL (ref 0–99)
Triglycerides: 189 mg/dL — ABNORMAL HIGH (ref 0–149)
VLDL Cholesterol Cal: 32 mg/dL (ref 5–40)

## 2024-05-27 LAB — CMP14 + ANION GAP
ALT: 22 [IU]/L (ref 0–32)
AST: 26 [IU]/L (ref 0–40)
Albumin: 4.5 g/dL (ref 3.8–4.8)
Alkaline Phosphatase: 137 [IU]/L — ABNORMAL HIGH (ref 49–135)
Anion Gap: 15 mmol/L (ref 10.0–18.0)
BUN/Creatinine Ratio: 25 (ref 12–28)
BUN: 21 mg/dL (ref 8–27)
Bilirubin Total: 0.5 mg/dL (ref 0.0–1.2)
CO2: 22 mmol/L (ref 20–29)
Calcium: 9.9 mg/dL (ref 8.7–10.3)
Chloride: 105 mmol/L (ref 96–106)
Creatinine, Ser: 0.85 mg/dL (ref 0.57–1.00)
Globulin, Total: 2.5 g/dL (ref 1.5–4.5)
Glucose: 94 mg/dL (ref 70–99)
Potassium: 5 mmol/L (ref 3.5–5.2)
Sodium: 142 mmol/L (ref 134–144)
Total Protein: 7 g/dL (ref 6.0–8.5)
eGFR: 71 mL/min/{1.73_m2}

## 2024-05-27 LAB — HEMOGLOBIN A1C
Est. average glucose Bld gHb Est-mCnc: 123 mg/dL
Hgb A1c MFr Bld: 5.9 % — ABNORMAL HIGH (ref 4.8–5.6)

## 2024-05-27 MED ORDER — PREGABALIN 100 MG PO CAPS: 100.0000 mg | ORAL_CAPSULE | Freq: Two times a day (BID) | ORAL | 2 refills | Status: AC

## 2024-05-28 ENCOUNTER — Other Ambulatory Visit: Payer: Self-pay

## 2024-05-28 MED ORDER — ATORVASTATIN CALCIUM 20 MG PO TABS: 20.0000 mg | ORAL_TABLET | Freq: Every day | ORAL | 11 refills | Status: AC

## 2024-06-02 ENCOUNTER — Other Ambulatory Visit: Payer: Self-pay

## 2024-06-02 MED ORDER — LOSARTAN POTASSIUM 50 MG PO TABS: 50.0000 mg | ORAL_TABLET | Freq: Every day | ORAL | 0 refills | Status: AC

## 2024-06-02 NOTE — Progress Notes (Signed)
 Refilled Losartan  to [facility] in [place]

## 2024-06-03 ENCOUNTER — Ambulatory Visit (HOSPITAL_BASED_OUTPATIENT_CLINIC_OR_DEPARTMENT_OTHER)
Admission: EM | Admit: 2024-06-03 | Discharge: 2024-06-03 | Disposition: A | Discharge status: Home / Self Care | Source: Home / Self Care

## 2024-06-03 ENCOUNTER — Encounter (HOSPITAL_BASED_OUTPATIENT_CLINIC_OR_DEPARTMENT_OTHER): Payer: Self-pay

## 2024-06-03 ENCOUNTER — Other Ambulatory Visit (HOSPITAL_BASED_OUTPATIENT_CLINIC_OR_DEPARTMENT_OTHER): Payer: Self-pay

## 2024-06-03 DIAGNOSIS — N3001 Acute cystitis with hematuria: Secondary | ICD-10-CM | POA: Diagnosis not present

## 2024-06-03 LAB — POCT URINE DIPSTICK
Bilirubin, UA: NEGATIVE
Glucose, UA: NEGATIVE mg/dL
Ketones, POC UA: NEGATIVE mg/dL
Nitrite, UA: POSITIVE — AB
POC PROTEIN,UA: 30 — AB
Spec Grav, UA: 1.02
Urobilinogen, UA: 0.2 U/dL
pH, UA: 7

## 2024-06-03 MED ORDER — CEPHALEXIN 500 MG PO CAPS
500.0000 mg | ORAL_CAPSULE | Freq: Three times a day (TID) | ORAL | 0 refills | Status: AC
  Filled 2024-06-03: qty 21, 7d supply, fill #0

## 2024-06-03 NOTE — ED Triage Notes (Signed)
 Pt c/o pelvic pain, decreased urinary output, and cloudy urine for the last 3-4 days. Reports around a month ago she had a uti and was given an abx but it did not clear in the infection so she was started on a second abx. Denies dysuria or hematuria.

## 2024-06-03 NOTE — ED Provider Notes (Addendum)
 " [facility]    CSN: [id number] Arrival date & time: 06/03/24  1203      History   Chief Complaint Chief Complaint  Patient presents with   Pelvic Pain    HPI Kimberly Larsen is a 79 y.o. female.   Pt c/o pelvic pain, decreased urinary output, and cloudy urine for the last 3-4 days. Reports around a month ago she had a uti and was given an abx but it did not clear in the infection so she was started on a second abx. Denies dysuria or hematuria.    Pelvic Pain    Past Medical History:  Diagnosis Date   Acute postoperative respiratory failure 03/25/2014   Anesthesia of skin    Anxiety    Aortic atherosclerosis    Arthritis    Back pain    Benign essential HTN 03/25/2014   Cancer (HCC)    endometrial cancer   Chronic daily headache 10/23/2017   Chronic low back pain 07/30/2017   Chronic pain syndrome 02/23/2014   DDD (degenerative disc disease), lumbar    Depression    Endometrial cancer (HCC) 01/10/2016   Fibromyalgia 09/08/2012   Gastroparesis    Genetic testing 12/17/2017   TumorNext [facility] + CancerNext was ordered. The CancerNext gene panel offered by [employer] includes sequencing and rearrangement analysis for the following 32 genes:   APC, ATM, BARD1, BMPR1A, BRCA1, BRCA2, BRIP1, CDH1, CDK4, CDKN2A, CHEK2, DICER1, EPCAM, GREM1, HOXB13MLH1, MRE11A, MSH2, MSH6, MUTYH, NBN, NF1, PALB2, PMS2, POLD1, POLE, PTEN, RAD50, RAD51D, SMAD4, SMARCA4, STK11, and TP53.   Ger   History of kidney stones    Hyperlipidemia 03/25/2014   Insomnia 09/08/2012   Long-term current use of opiate analgesic 05/28/2017   Lumbar post-laminectomy syndrome 07/30/2017   Near syncope 07/10/2017   NEPHROLITHIASIS 06/26/2006   Qualifier: Diagnosis of  By: JANEY PARODY     Obese 11/11/2018   Obesity (BMI 30-39.9) 09/02/2012   Obstructive uropathy 03/25/2014   Osteoporosis    Prolactinoma (HCC) 07/16/2017   S/P left TKA 11/10/2018   S/P right TKA 08/31/2012   UTI (urinary  tract infection)     Patient Active Problem List   Diagnosis Date Noted   Cystitis 04/15/2024   Pain of left lower extremity 01/07/2024   Leg swelling 12/26/2023   Myalgia 11/17/2023   Prediabetes 08/13/2023   Essential hypertension 05/19/2023   Hypercholesterolemia 05/19/2023   Pituitary macroadenoma (HCC) 05/19/2023   History of endometrial cancer 05/19/2023   Aortic atherosclerosis 05/19/2023   Primary osteoarthritis involving multiple joints 05/19/2023   Osteoporosis without current pathological fracture 05/19/2023   BMI 37.0-37.9, adult 05/19/2023   Morbid obesity (HCC) 05/19/2023   Hypothyroidism 05/19/2023   Pain of upper abdomen 02/17/2023   Hematoma 02/10/2023   GAD (generalized anxiety disorder) 02/10/2023   Multiple closed fractures of ribs of right side 02/03/2023   Closed fracture of first lumbar vertebra with routine healing 02/03/2023   Seasonal allergies 12/27/2022   Hyperglycemia 11/06/2022   Trigger thumb of left hand 01/10/2022   Complex regional pain syndrome of lower limb 02/13/2021   Sacroiliac joint pain 01/07/2021   Nonspecific abnormal electrocardiogram (ECG) (EKG) 11/03/2020   Dyspnea on exertion 08/19/2019   Degeneration of lumbar intervertebral disc 05/10/2019   Obese 11/11/2018   S/P left TKA 11/10/2018   Pain in left knee 01/08/2018   Genetic testing 12/17/2017   Chronic low back pain 07/30/2017   Lumbar post-laminectomy syndrome 07/30/2017   Prolactinoma (HCC) 07/16/2017  UTI (urinary tract infection) 07/10/2017   Near syncope 07/10/2017   Lactic acidosis 07/10/2017   Endometrial cancer (HCC) 01/10/2016   Severe sepsis with septic shock (HCC) 03/25/2014   AKI (acute kidney injury) (HCC) 03/25/2014   Benign essential HTN 03/25/2014   Obstructive uropathy 03/25/2014   Hyperlipidemia 03/25/2014   Acute postoperative respiratory failure 03/25/2014   Chronic pain syndrome 02/23/2014   Fibromyalgia 09/08/2012   Insomnia 09/08/2012    Obesity (BMI 30-39.9) 09/02/2012   Expected blood loss anemia 09/02/2012   S/P right TKA 08/31/2012   HYPERTENSION, BENIGN SYSTEMIC 06/26/2006   NEPHROLITHIASIS 06/26/2006    Past Surgical History:  Procedure Laterality Date   BACK SURGERY  2013   CARPAL TUNNEL RELEASE     R hand   CYSTOSCOPY WITH URETEROSCOPY AND STENT PLACEMENT Bilateral 03/25/2014   Procedure: CYSTOSCOPY WITH URETEROSCOPY AND STENT PLACEMENT;  Surgeon: Gretel Ferrara, MD;  Location: [facility];  Service: Urology;  Laterality: Bilateral;   CYSTOSCOPY WITH URETEROSCOPY AND STENT PLACEMENT Bilateral 04/18/2014   Procedure: CYSTOSCOPY WITH URETEROSCOPY AND STENT PLACEMENT,RETROGRADE;  Surgeon: Gretel Ferrara, MD;  Location: [facility];  Service: Urology;  Laterality: Bilateral;   CYSTOSCOPY WITH URETEROSCOPY AND STENT PLACEMENT Right 05/30/2014   Procedure: CYSTOSCOPY WITH URETEROSCOPY AND STENT PLACEMENT;  Surgeon: Gretel Ferrara, MD;  Location: [facility];  Service: Urology;  Laterality: Right;   CYSTOSCOPY/RETROGRADE/URETEROSCOPY Left 05/30/2014   Procedure: LEFT RETROGRADE;  Surgeon: Gretel Ferrara, MD;  Location: [facility];  Service: Urology;  Laterality: Left;   CYSTOSCOPY/URETEROSCOPY/HOLMIUM LASER/STENT PLACEMENT Left 11/25/2019   Procedure: CYSTOSCOPY/RETROGRADE/URETEROSCOPY/HOLMIUM LASER/STENT PLACEMENT;  Surgeon: Ferrara Gretel, MD;  Location: [facility];  Service: Urology;  Laterality: Left;   EYE SURGERY     cataract surgery bil   GANGLION CYST EXCISION     L ankle   HAMMERTOE RECONSTRUCTION WITH WEIL OSTEOTOMY Left 09/05/2016   Procedure: Second Metatarsal Weil Osteotomy and Hammertoe Correction;  Surgeon: Kit Rush, MD;  Location: [facility];  Service: Orthopedics;  Laterality: Left;   HOLMIUM LASER APPLICATION Bilateral 04/18/2014   Procedure: HOLMIUM LASER APPLICATION;  Surgeon: Gretel Ferrara, MD;  Location: [facility];  Service: Urology;  Laterality: Bilateral;   JOINT REPLACEMENT     total knee   LITHOTRIPSY     x  3   LUMBAR FUSION  09/2011   METATARSAL OSTEOTOMY WITH BUNIONECTOMY Left 09/05/2016   Procedure: Left First Metatarsal Scarf Osteotomy, Modified McBride Bunion Correction;  Surgeon: Kit Rush, MD;  Location: [facility];  Service: Orthopedics;  Laterality: Left;   RHINOPLASTY     ROBOTIC ASSISTED TOTAL HYSTERECTOMY WITH BILATERAL SALPINGO OOPHERECTOMY Bilateral 01/16/2016   Procedure: XI ROBOTIC ASSISTED TOTAL HYSTERECTOMY WITH BILATERAL SALPINGO OOPHORECTOMY AND BILATERAL PELVIC LYMPH NODE DISSECTION;  Surgeon: Maurilio Ship, MD;  Location: [facility];  Service: Gynecology;  Laterality: Bilateral;   SPINAL CORD STIMULATOR INSERTION N/A 02/23/2014   Procedure: SPINAL CORD STIMULATOR PLACEMENT ;  Surgeon: Donaciano Sprang, MD;  Location: [facility];  Service: Orthopedics;  Laterality: N/A;   TONSILLECTOMY     TOTAL KNEE ARTHROPLASTY Right 08/31/2012   Procedure: RIGHT TOTAL KNEE ARTHROPLASTY;  Surgeon: Donnice JONETTA Car, MD;  Location: [facility];  Service: Orthopedics;  Laterality: Right;  with LMA   TOTAL KNEE ARTHROPLASTY Left 11/10/2018   Procedure: TOTAL KNEE ARTHROPLASTY;  Surgeon: Car Donnice, MD;  Location: [facility];  Service: Orthopedics;  Laterality: Left;  70 mins   TUBAL LIGATION      OB History   No obstetric  history on file.      Home Medications    Prior to Admission medications  Medication Sig Start Date End Date Taking? Authorizing Provider  cephALEXin  (KEFLEX ) 500 MG capsule Take 1 capsule (500 mg total) by mouth 3 (three) times daily for 7 days. 06/03/24 06/10/24 Yes Mardie Kellen A, FNP  acetaminophen  (TYLENOL ) 325 MG tablet Take 650 mg by mouth every 6 (six) hours as needed.    [provider]  aspirin  81 MG EC tablet Chew 1 tablet by mouth daily.    [provider]  atorvastatin  (LIPITOR) 20 MG tablet Take 1 tablet (20 mg total) by mouth at bedtime. 05/28/24 05/28/25  Amin, Saad, MD  azelastine  (ASTELIN ) 0.1 % nasal spray Place 2 sprays into both nostrils 2 (two)  times daily as needed. 05/07/24   Amin, Saad, MD  AZO D-MANNOSE PO Take by mouth.    [provider]  calcium  citrate (CALCITRATE - DOSED IN MG ELEMENTAL CALCIUM ) 950 (200 Ca) MG tablet Take 630 mg of elemental calcium  by mouth daily.    [provider]  cloNIDine  (CATAPRES ) 0.1 MG tablet Take 1 tablet (0.1 mg total) by mouth at bedtime. 01/01/23   Fleeta Valeria Mayo, MD  docusate sodium  (COLACE) 50 MG capsule Take 50 mg by mouth at bedtime.    [provider]  levothyroxine  (SYNTHROID ) 25 MCG tablet Take 25 mcg by mouth daily before breakfast.  09/02/19   [provider]  losartan  (COZAAR ) 50 MG tablet Take 1 tablet (50 mg total) by mouth daily. 06/02/24   Amin, Saad, MD  Magnesium  400 MG TABS Take 400 mg by mouth daily.    [provider]  methocarbamol  (ROBAXIN ) 500 MG tablet Take 1 tablet (500 mg total) by mouth 4 (four) times daily. 05/25/24   Amin, Saad, MD  metoprolol  succinate (TOPROL -XL) 25 MG 24 hr tablet Take 1 tablet (25 mg total) by mouth daily. 05/17/24   Amin, Saad, MD  montelukast  (SINGULAIR ) 10 MG tablet Take 1 tablet (10 mg total) by mouth at bedtime. 05/25/24   Amin, Saad, MD  Multiple Vitamins-Minerals (HAIR SKIN & NAILS ADVANCED PO) Take by mouth.    [provider]  nitrofurantoin  (MACRODANTIN ) 100 MG capsule Take 1 capsule (100 mg total) by mouth 4 (four) times daily. 03/15/24   Amin, Saad, MD  Omega-3 Fatty Acids (FISH OIL ) 1000 MG CAPS Take 1 capsule by mouth in the morning and at bedtime.     [provider]  ondansetron  (ZOFRAN ) 4 MG tablet Take 1 tablet (4 mg total) by mouth every 8 (eight) hours as needed for nausea or vomiting. 05/19/23   Fleeta Valeria, Mayo, MD  pantoprazole  (PROTONIX ) 40 MG tablet TAKE 1 TABLET BY MOUTH EVERY DAY 02/09/24   Fleeta Valeria Mayo, MD  polyethylene glycol (MIRALAX  / GLYCOLAX ) 17 g packet Take 17 g by mouth 2 (two) times daily. 11/11/18   Danella Cough, PA-C  pregabalin  (LYRICA ) 100 MG capsule Take 1  capsule (100 mg total) by mouth 2 (two) times daily. 05/27/24   Amin, Saad, MD  QUEtiapine  (SEROQUEL ) 100 MG tablet Take 1 tablet (100 mg total) by mouth at bedtime. 05/07/24   Amin, Saad, MD  sertraline  (ZOLOFT ) 50 MG tablet TAKE 1 TABLET BY MOUTH EVERY DAY 01/23/24   Fleeta Valeria Mayo, MD    Family History Family History  Problem Relation Age of Onset   Stroke Mother    Emphysema Mother    COPD Mother  Hypertension Mother    Stroke Father    Heart disease Father    Emphysema Father    Hypertension Father    Heart disease Brother     Social History Social History[1]   Allergies   Amoxicillin -pot clavulanate and Bupropion   Review of Systems Review of Systems  Genitourinary:  Positive for pelvic pain.     Physical Exam Triage Vital Signs ED Triage Vitals  Encounter Vitals Group     BP 06/03/24 1223 (!) 160/86     Girls Systolic BP Percentile --      Girls Diastolic BP Percentile --      Boys Systolic BP Percentile --      Boys Diastolic BP Percentile --      Pulse Rate 06/03/24 1223 61     Resp 06/03/24 1223 20     Temp 06/03/24 1223 98 F (36.7 C)     Temp Source 06/03/24 1223 Oral     SpO2 06/03/24 1223 98 %     Weight --      Height --      Head Circumference --      Peak Flow --      Pain Score 06/03/24 1222 3     Pain Loc --      Pain Education --      Exclude from Growth Chart --    No data found.  Updated Vital Signs BP (!) 160/86 (BP Location: Right Arm)   Pulse 61   Temp 98 F (36.7 C) (Oral)   Resp 20   SpO2 98%   Visual Acuity Right Eye Distance:   Left Eye Distance:   Bilateral Distance:    Right Eye Near:   Left Eye Near:    Bilateral Near:     Physical Exam Vitals and nursing note reviewed.  Constitutional:      General: She is not in acute distress.    Appearance: Normal appearance. She is not ill-appearing, toxic-appearing or diaphoretic.  Pulmonary:     Effort: Pulmonary effort is normal.  Neurological:     Mental Status:  She is alert.  Psychiatric:        Mood and Affect: Mood normal.      UC Treatments / Results  Labs (all labs ordered are listed, but only abnormal results are displayed) Labs Reviewed  POCT URINE DIPSTICK - Abnormal; Notable for the following components:      Result Value   Color, UA orange (*)    Clarity, UA cloudy (*)    Blood, UA trace-lysed (*)    POC PROTEIN,UA =30 (*)    Nitrite, UA Positive (*)    Leukocytes, UA Large (3+) (*)    All other components within normal limits  URINE CULTURE    EKG   Radiology No results found.  Procedures Procedures (including critical care time)  Medications Ordered in UC Medications - No data to display  Initial Impression / Assessment and Plan / UC Course  I have reviewed the triage vital signs and the nursing notes.  Pertinent labs & imaging results that were available during my care of the patient were reviewed by me and considered in my medical decision making (see chart for details).     Acute cystitis- Urine positive for UTI Sending for culture.  Treating with kelfex .  Increase fluids.  Can take AZO OTC as needed for symptoms.   Final Clinical Impressions(s) / UC Diagnoses   Final diagnoses:  Acute  cystitis with hematuria     Discharge Instructions      Treating you for urinary tract infection.  Will send for culture and call with any updates or changes if needed.  Recommend push lots of fluids.  You can take Azo over-the-counter for symptoms as needed.  Follow-up as needed with your regular doctor     ED Prescriptions     Medication Sig Dispense Auth. Provider   cephALEXin  (KEFLEX ) 500 MG capsule Take 1 capsule (500 mg total) by mouth 3 (three) times daily for 7 days. 21 capsule Adah Wilbert LABOR, FNP      PDMP not reviewed this encounter.    Adah Wilbert LABOR, FNP 06/03/24 1345     [1]  Social History Tobacco Use   Smoking status: Never    Passive exposure: Never   Smokeless tobacco: Never   Vaping Use   Vaping status: Never Used  Substance Use Topics   Alcohol  use: No   Drug use: No     Adah Wilbert LABOR, FNP 06/03/24 1345  "

## 2024-06-03 NOTE — Discharge Instructions (Signed)
 Treating you for urinary tract infection.  Will send for culture and call with any updates or changes if needed.  Recommend push lots of fluids.  You can take Azo over-the-counter for symptoms as needed.  Follow-up as needed with your regular doctor

## 2024-06-04 ENCOUNTER — Ambulatory Visit

## 2024-08-18 ENCOUNTER — Ambulatory Visit: Admitting: Internal Medicine
